# Patient Record
Sex: Female | Born: 1935
Health system: Southern US, Community
[De-identification: ages and names within clinical notes are randomized; demographics above are authoritative.]

## PROBLEM LIST (undated history)

## (undated) DIAGNOSIS — I351 Nonrheumatic aortic (valve) insufficiency: Secondary | ICD-10-CM

## (undated) DIAGNOSIS — J449 Chronic obstructive pulmonary disease, unspecified: Secondary | ICD-10-CM

## (undated) DIAGNOSIS — M1990 Unspecified osteoarthritis, unspecified site: Secondary | ICD-10-CM

## (undated) DIAGNOSIS — E785 Hyperlipidemia, unspecified: Secondary | ICD-10-CM

## (undated) DIAGNOSIS — Z9889 Other specified postprocedural states: Secondary | ICD-10-CM

## (undated) DIAGNOSIS — I10 Essential (primary) hypertension: Secondary | ICD-10-CM

## (undated) DIAGNOSIS — E039 Hypothyroidism, unspecified: Secondary | ICD-10-CM

## (undated) DIAGNOSIS — J479 Bronchiectasis, uncomplicated: Secondary | ICD-10-CM

## (undated) DIAGNOSIS — R002 Palpitations: Secondary | ICD-10-CM

## (undated) HISTORY — DX: Essential (primary) hypertension: I10

## (undated) HISTORY — DX: Other specified postprocedural states: Z98.890

## (undated) HISTORY — PX: CATARACT EXTRACTION, BILATERAL: SHX1313

## (undated) HISTORY — DX: Unspecified osteoarthritis, unspecified site: M19.90

## (undated) HISTORY — DX: Palpitations: R00.2

## (undated) HISTORY — DX: Hyperlipidemia, unspecified: E78.5

---

## 1991-09-28 HISTORY — PX: ABDOMINAL HYSTERECTOMY: SHX81

## 1998-11-18 ENCOUNTER — Ambulatory Visit (HOSPITAL_COMMUNITY): Admission: RE | Admit: 1998-11-18 | Discharge: 1998-11-18 | Payer: Self-pay | Admitting: *Deleted

## 1999-01-05 ENCOUNTER — Other Ambulatory Visit: Admission: RE | Admit: 1999-01-05 | Discharge: 1999-01-05 | Payer: Self-pay | Admitting: Obstetrics and Gynecology

## 1999-06-12 ENCOUNTER — Ambulatory Visit (HOSPITAL_COMMUNITY): Admission: RE | Admit: 1999-06-12 | Discharge: 1999-06-12 | Payer: Self-pay | Admitting: *Deleted

## 1999-11-27 ENCOUNTER — Ambulatory Visit (HOSPITAL_COMMUNITY): Admission: RE | Admit: 1999-11-27 | Discharge: 1999-11-27 | Payer: Self-pay | Admitting: *Deleted

## 2000-03-04 ENCOUNTER — Encounter (INDEPENDENT_AMBULATORY_CARE_PROVIDER_SITE_OTHER): Payer: Self-pay | Admitting: Specialist

## 2000-03-04 ENCOUNTER — Ambulatory Visit (HOSPITAL_COMMUNITY): Admission: RE | Admit: 2000-03-04 | Discharge: 2000-03-04 | Payer: Self-pay | Admitting: Gastroenterology

## 2000-05-27 ENCOUNTER — Other Ambulatory Visit: Admission: RE | Admit: 2000-05-27 | Discharge: 2000-05-27 | Payer: Self-pay | Admitting: Obstetrics and Gynecology

## 2001-12-22 ENCOUNTER — Encounter: Payer: Self-pay | Admitting: Internal Medicine

## 2001-12-22 ENCOUNTER — Ambulatory Visit (HOSPITAL_COMMUNITY): Admission: RE | Admit: 2001-12-22 | Discharge: 2001-12-22 | Payer: Self-pay | Admitting: Internal Medicine

## 2003-08-06 ENCOUNTER — Emergency Department (HOSPITAL_COMMUNITY): Admission: EM | Admit: 2003-08-06 | Discharge: 2003-08-07 | Payer: Self-pay | Admitting: Emergency Medicine

## 2003-08-12 ENCOUNTER — Ambulatory Visit (HOSPITAL_COMMUNITY): Admission: RE | Admit: 2003-08-12 | Discharge: 2003-08-12 | Payer: Self-pay | Admitting: Internal Medicine

## 2003-09-03 ENCOUNTER — Ambulatory Visit (HOSPITAL_COMMUNITY): Admission: RE | Admit: 2003-09-03 | Discharge: 2003-09-03 | Payer: Self-pay | Admitting: Internal Medicine

## 2004-11-17 ENCOUNTER — Ambulatory Visit (HOSPITAL_COMMUNITY): Admission: RE | Admit: 2004-11-17 | Discharge: 2004-11-17 | Payer: Self-pay | Admitting: Internal Medicine

## 2004-11-18 ENCOUNTER — Ambulatory Visit (HOSPITAL_COMMUNITY): Admission: RE | Admit: 2004-11-18 | Discharge: 2004-11-18 | Payer: Self-pay | Admitting: Internal Medicine

## 2005-08-06 ENCOUNTER — Ambulatory Visit (HOSPITAL_COMMUNITY): Admission: RE | Admit: 2005-08-06 | Discharge: 2005-08-06 | Payer: Self-pay | Admitting: Internal Medicine

## 2005-10-07 ENCOUNTER — Other Ambulatory Visit: Admission: RE | Admit: 2005-10-07 | Discharge: 2005-10-07 | Payer: Self-pay | Admitting: Dermatology

## 2006-01-14 ENCOUNTER — Ambulatory Visit: Payer: Self-pay | Admitting: Internal Medicine

## 2006-01-17 ENCOUNTER — Ambulatory Visit: Payer: Self-pay | Admitting: Internal Medicine

## 2006-01-26 ENCOUNTER — Ambulatory Visit (HOSPITAL_COMMUNITY): Admission: RE | Admit: 2006-01-26 | Discharge: 2006-01-26 | Payer: Self-pay | Admitting: Internal Medicine

## 2006-02-14 ENCOUNTER — Ambulatory Visit: Payer: Self-pay | Admitting: Internal Medicine

## 2007-03-27 ENCOUNTER — Ambulatory Visit: Payer: Self-pay | Admitting: Internal Medicine

## 2007-03-29 ENCOUNTER — Ambulatory Visit (HOSPITAL_COMMUNITY): Admission: RE | Admit: 2007-03-29 | Discharge: 2007-03-29 | Payer: Self-pay | Admitting: Internal Medicine

## 2007-08-15 ENCOUNTER — Encounter: Admission: RE | Admit: 2007-08-15 | Discharge: 2007-08-15 | Payer: Self-pay | Admitting: Cardiology

## 2007-09-20 ENCOUNTER — Inpatient Hospital Stay (HOSPITAL_COMMUNITY): Admission: EM | Admit: 2007-09-20 | Discharge: 2007-09-23 | Payer: Self-pay | Admitting: Emergency Medicine

## 2007-09-26 ENCOUNTER — Ambulatory Visit: Payer: Self-pay | Admitting: Gastroenterology

## 2007-10-20 ENCOUNTER — Ambulatory Visit: Payer: Self-pay | Admitting: Gastroenterology

## 2007-10-24 ENCOUNTER — Ambulatory Visit: Payer: Self-pay | Admitting: Gastroenterology

## 2007-10-24 ENCOUNTER — Encounter: Payer: Self-pay | Admitting: Gastroenterology

## 2009-07-22 ENCOUNTER — Telehealth: Payer: Self-pay | Admitting: Gastroenterology

## 2010-04-29 ENCOUNTER — Ambulatory Visit (HOSPITAL_COMMUNITY): Admission: RE | Admit: 2010-04-29 | Discharge: 2010-04-29 | Payer: Self-pay | Admitting: Internal Medicine

## 2010-05-08 ENCOUNTER — Ambulatory Visit: Payer: Self-pay | Admitting: Internal Medicine

## 2010-05-08 DIAGNOSIS — J439 Emphysema, unspecified: Secondary | ICD-10-CM

## 2010-05-08 DIAGNOSIS — R918 Other nonspecific abnormal finding of lung field: Secondary | ICD-10-CM

## 2010-05-08 DIAGNOSIS — I10 Essential (primary) hypertension: Secondary | ICD-10-CM | POA: Insufficient documentation

## 2010-05-12 ENCOUNTER — Ambulatory Visit: Payer: Self-pay | Admitting: Cardiology

## 2010-10-18 ENCOUNTER — Encounter: Payer: Self-pay | Admitting: Internal Medicine

## 2010-10-27 NOTE — Assessment & Plan Note (Signed)
Summary: re establish-Dr. Maricela Bo report f/u/kcw   CC:  Re-establish per Amy Stevenson-CXR report ? CT needed.[name]  History of Present Illness: 03/30/07-  PROBLEM: 1. Lung nodules. 2. Obstructive airways disease. 3. Insomnia.  HISTORY:  Complains ears are stopped up, best unchanged.  No cough now in midsummer.  No sudden events.  No adenopathy or fever.   May 08, 2010- 77 yoF former smoker,  last seen in 2008- paper chart, with hx lung nodule. Sent back now courtesy of Dr Johnnye Sima to reassess. Denies cough and says chest and breathing feel well. CT from 03/29/07, compared back to  2006 had favored multiple lung nodules stable and benign., She walks treadmill 2 miles, twice weekly at gym. Occasional cough after dinner, otw no cough, wheeze, phlegm, chest pain, nodes, fever, bolld or purulent discharge. Would like to gain weight, but not losing.  Preventive Screening-Counseling & Management  Alcohol-Tobacco     Smoking Status: quit     Packs/Day: 1.0     Year Started: 1952     Year Quit: 1972     Pack years: 20  Current Medications (verified): 1)  Multivitamins  Tabs (Multiple Vitamin) .... Take 1 By Mouth Once Daily 2)  Vitamin C 500 Mg Tabs (Ascorbic Acid) .... Take 1 By Mouth Once Daily 3)  Atenolol 100 Mg Tabs (Atenolol) .... Take 1 By Mouth Two Times A Day 4)  Triamterene-Hctz 75-50 Mg Tabs (Triamterene-Hctz) .... Take 1 By Mouth Once Daily 5)  Pravastatin Sodium 20 Mg Tabs (Pravastatin Sodium) .... Take 1 By Mouth On M,w,f 6)  Amlodipine Besylate 10 Mg Tabs (Amlodipine Besylate) .... Take 1 By Mouth Once Daily  Allergies (verified): No Known Drug Allergies  Past History:  Past Medical History: C O P D Hypertension Lower GI bleed- diverticulosis, transfusion 2008 Lung nodules  Family History: Father- died cancer Mother- died cancer  Social History: Married  [place] . Passive- husband smokes outsideSmoking Status:  quit Packs/Day:   1.0 Pack years:  20  Review of Systems      See HPI       The patient complains of irregular heartbeats, nasal congestion/difficulty breathing through nose, itching, anxiety, hand/feet swelling, and joint stiffness or pain.  The patient denies shortness of breath with activity, shortness of breath at rest, productive cough, non-productive cough, coughing up blood, chest pain, acid heartburn, indigestion, loss of appetite, weight change, abdominal pain, difficulty swallowing, sore throat, tooth/dental problems, headaches, sneezing, ear ache, depression, rash, change in color of mucus, and fever.    Vital Signs:  Patient profile:   77 year old female Weight:      100.38 pounds O2 Sat:      97 % on Room air Pulse rate:   76 / minute BP sitting:   136 / 80  (right arm) Cuff size:   regular  Vitals Entered By: Clayborne Dana CMA (May 08, 2010 11:48 AM)  O2 Flow:  Room air CC: Re-establish per Amy Stevenson-CXR report ? CT needed.    Impression & Recommendations:  Problem # 1:  LUNG NODULE (ICD-518.89)  She would like an update on lung nodules with respect to her significant past active and passive smoking hx. We dscussed options and agreed to get a CT without contrast. We can call result. I will put down to see her in a year, subject to review of CT.  Problem # 2:  COPD (ICD-496) Clinically stable. She would like to gain weight but isn't  losing. Few stigmata of active disease. No wheeze or cough to require bronchodilators.  Medications Added to Medication List This Visit: 1)  Multivitamins Tabs (Multiple vitamin) .... Take 1 by mouth once daily 2)  Vitamin C 500 Mg Tabs (Ascorbic acid) .... Take 1 by mouth once daily 3)  Atenolol 100 Mg Tabs (Atenolol) .... Take 1 by mouth two times a day 4)  Triamterene-hctz 75-50 Mg Tabs (Triamterene-hctz) .... Take 1 by mouth once daily 5)  Pravastatin Sodium 20 Mg Tabs (Pravastatin sodium) .... Take 1 by mouth on m,w,f 6)  Amlodipine Besylate  10 Mg Tabs (Amlodipine besylate) .... Take 1 by mouth once daily  Other Orders: Est. Patient Level IV (50757) Radiology Referral (Radiology)  Patient Instructions: 1)  Please schedule a follow-up appointment in 1 year. 2)  A Chest CT WITHOUT Contrast has been recommended. Your imaging study may require preauthorization.

## 2010-11-12 ENCOUNTER — Ambulatory Visit: Payer: Self-pay | Admitting: Internal Medicine

## 2011-01-15 ENCOUNTER — Encounter: Payer: Self-pay | Admitting: Cardiology

## 2011-01-15 DIAGNOSIS — I340 Nonrheumatic mitral (valve) insufficiency: Secondary | ICD-10-CM

## 2011-01-15 DIAGNOSIS — R002 Palpitations: Secondary | ICD-10-CM

## 2011-01-15 DIAGNOSIS — M1990 Unspecified osteoarthritis, unspecified site: Secondary | ICD-10-CM

## 2011-01-15 DIAGNOSIS — I10 Essential (primary) hypertension: Secondary | ICD-10-CM

## 2011-01-15 DIAGNOSIS — E785 Hyperlipidemia, unspecified: Secondary | ICD-10-CM

## 2011-01-15 DIAGNOSIS — I071 Rheumatic tricuspid insufficiency: Secondary | ICD-10-CM

## 2011-01-19 ENCOUNTER — Ambulatory Visit (INDEPENDENT_AMBULATORY_CARE_PROVIDER_SITE_OTHER): Payer: Medicare Other | Admitting: Cardiology

## 2011-01-19 ENCOUNTER — Encounter: Payer: Self-pay | Admitting: Cardiology

## 2011-01-19 DIAGNOSIS — I10 Essential (primary) hypertension: Secondary | ICD-10-CM

## 2011-01-19 DIAGNOSIS — R002 Palpitations: Secondary | ICD-10-CM

## 2011-01-19 NOTE — Assessment & Plan Note (Signed)
I think her current regimen is satisfactory. Dr. Johnnye Sima has been watching her hypertension and has added doxazosin. She does have some mild lower extremity edema as the day progresses have sure that is why she decreased amlodipine. It is possible that doxazosin is leading to her tendency toward orthostasis.

## 2011-01-19 NOTE — Progress Notes (Signed)
Subjective:   Andrea [name] is seen today for followup visit. Her blood pressure readings have been satisfactory and they have been followed by Dr. Johnnye Sima. She's not having much in way of chest pain. She still has some indigestion and is willing to take over-the-counter Prilosec. Overall, she seemingly is doing well without significant complaint. She noted that she had some lightheadedness when she stands but on careful questioning, she denied that was any significant problem. She has gained about 10 pounds of weight since her last visit which is much better. Overall, she doesn't have any significant complaints. I discussed ongoing cardiology followup for palpitations and hypertension with Dr. Stanford Breed in 6-8 months he  Current Outpatient Prescriptions  Medication Sig Dispense Refill  . ALPRAZolam (XANAX) 0.5 MG tablet Take 0.25 mg by mouth at bedtime as needed.        [name] amLODipine (NORVASC) 5 MG tablet Take 5 mg by mouth 2 (two) times daily.        . Ascorbic Acid (VITAMIN C) 500 MG tablet Take 500 mg by mouth daily.        [name] atenolol (TENORMIN) 100 MG tablet Take 100 mg by mouth 2 (two) times daily.       . DHA-EPA-Coenzyme Q10-Vitamin E (COQ-10 & FISH OIL PO) Take 1 capsule by mouth daily.        [name] doxazosin (CARDURA) 2 MG tablet Take 2 mg by mouth at bedtime.        . Multiple Vitamin (MULTIVITAMIN) tablet Take 1 tablet by mouth daily.        . pravastatin (PRAVACHOL) 20 MG tablet 1 tab daily on Mon, Wed, Fri       . triamterene-hydrochlorothiazide (MAXZIDE) 75-50 MG per tablet Take 0.5 tablets by mouth daily.          No Known Allergies  Patient Active Problem List  Diagnoses  . HYPERTENSION  . Chronic airway obstruction, not elsewhere classified  . LUNG NODULE  . Palpitations  . HLD (hyperlipidemia)  . DJD (degenerative joint disease)  . Mild mitral regurgitation by prior echocardiogram  . Mild tricuspid regurgitation  . HTN (hypertension)  . Chest pain    History  Smoking status  .  Former Smoker -- 1.0 packs/day for 20 years  . Types: Cigarettes  . Quit date: 09/27/1974  Smokeless tobacco  . Never Used    History  Alcohol Use No    No family history on file.  Review of Systems:   The patient denies any heat or cold intolerance.  No weight gain or weight loss.  The patient denies headaches or blurry vision.  There is no cough or sputum production.  The patient denies dizziness.  There is no hematuria or hematochezia.  The patient denies any muscle aches or arthritis.  The patient denies any rash.  The patient denies frequent falling or instability.  There is no history of depression or anxiety.  All other systems were reviewed and are negative.   Physical Exam:   Weight is 110. Blood pressure 130/60 sitting, 102/44 standing.The head is normocephalic and atraumatic.  Pupils are equally round and reactive to light.  Sclerae nonicteric.  Conjunctiva is clear.  Oropharynx is unremarkable.  There's adequate oral airway.  Neck is supple there are no masses.  Thyroid is not enlarged.  There is no lymphadenopathy.  Lungs are clear.  Chest is symmetric.  Heart shows a regular rate and rhythm.  S1 and S2 are normal.  There is no  murmur click or gallop.  Abdomen is soft normal bowel sounds.  There is no organomegaly.  Genital and rectal deferred.  Extremities are without edema.  Peripheral pulses are adequate.  Neurologically intact.  Full range of motion.  The patient is not depressed.  Skin is warm and dry.  Assessment / Plan:

## 2011-01-19 NOTE — Assessment & Plan Note (Signed)
Palpitations are reasonably controlled on atenolol 100 mg twice a day. She has palpitations when she lies down at night but not during the day. We'll continue her on her beta blockers for now.

## 2011-02-09 NOTE — Assessment & Plan Note (Signed)
[facility]                             PULMONARY OFFICE NOTE   NAME:Andrea [name], Andrea [name]                       MRN:          [id number]  DATE:03/27/2007                            DOB:          [birth year]    PROBLEM:  1. Lung nodules.  2. Obstructive airways disease.  3. Insomnia.   HISTORY:  Complains ears are stopped up, best unchanged.  No cough now  in midsummer.  No sudden events.  No adenopathy or fever.   MEDICATIONS:  1. Triamterene/hydrochlorothiazide 75/50 one half daily.  2. Atenolol 100 mg x2.  3. Diovan 160.  4. Aspirin 81 mg.  5. Calcium.  6. Multivitamins.  7. Flax seed oil.  8. Alprazolam 0.5 mg x1/2 nightly.   NO MEDICATION ALLERGY.   OBJECTIVE:  Weight 106 pounds.  BP 202/102.  Rechecked 172/78.  Pulse  75.  Room air saturation 97%.  Bruised right arm from walking in to a cabinet.  Thin.  No adenopathy.  Breath sounds are diminished bilaterally without cough, rales, or  wheeze.  HEART:  Sounds are regular.  I do not hear a murmur.  There is no peripheral edema.  Cerumen in both ears.   IMPRESSION:  1. Eustachian dysfunction and cerumen impaction.  2. History of lung nodules.  3. Minimal chronic obstructive pulmonary disease evident only as small      airway flow reduction with normal diffusion capacity when we      checked her a year ago.   PLAN:  1. Cerumen kit.  2. CT scan of chest to follow up on lung nodules, no contrast.  3. Schedule return in 1 year.   Chest CT shows stable lung nodules interpreted by radiologist as  consistent with a benign process with no further followup recommended.  Also, stable marked changes of COPD.  Nothing new.     Clinton D. Annamaria Boots, MD, Shade Flood, FACP  Electronically Signed    CDY/MedQ  DD: 03/30/2007  DT: 03/30/2007  Job #: [id number]   cc:   Darcus Austin, D.O.  Ludwig Lean. Doreatha Lew, M.D.

## 2011-02-09 NOTE — Consult Note (Signed)
NAMEGLESSIE, EUSTICE                ACCOUNT NO.:  [id number]   MEDICAL RECORD NO.:  [id number]          PATIENT TYPE:  EMS   LOCATION:  MAJO                         FACILITY:  [facility]   PHYSICIAN:  Sandy Salaam. Deatra Ina, MD,FACGDATE OF BIRTH:  08-Jan-1929   DATE OF CONSULTATION:  09/20/2007  DATE OF DISCHARGE:                                 CONSULTATION   PROBLEM:  Hematochezia.   HISTORY OF PRESENT ILLNESS:  Ms. Neale is a 77 year old white female  admitted through the emergency room with bright red blood per rectum.  Approximately 2 hours prior to hospital visit she developed painless  rectal bleeding.  She has had multiple (at least 3) episodes of bright  red blood per rectum soaking her clothes and her sheets.  She is passing  blood only and no stool.  She apparently underwent a colonoscopy  approximately 7 years ago although results are not known.  She has had  no recent change in bowel habits.  She is on no gastric irritants  including nonsteroidals.  An NG tube was put into the stomach and  gastric contents were aspirated in the emergency room.  No blood was  seen.   PAST MEDICAL HISTORY:  Pertinent for coronary artery disease.  She has  history of arrhythmias and hypertension.   MEDICATIONS:  Include Diovan, atenolol, Dyazide, baby aspirin and  alprazolam.   ALLERGIES:  None.   SOCIAL HISTORY:  She neither smokes nor drinks.  She is married and  retired.   REVIEW OF SYSTEMS:  Negative.   PHYSICAL EXAMINATION:  VITAL SIGNS:  Pulse 85, blood pressure 198/86.  HEENT:  She is anicteric.  Exam is within normal limits.  There is no  lymphadenopathy.  CHEST:  Clear.  CARDIAC:  There is a 1/6 early systolic murmur at the left sternal  border.  ABDOMEN:  Without masses, tenderness or organomegaly.  Bowel sounds are  hyperactive.  She has bright red blood extruding from her anus.   DATA:  Hemoglobin 12.7, hematocrit 38, white count 8.4, MCV is 92 and  platelet count 204.   Electrolytes are within normal limits.  BUN of 26.   IMPRESSION:  1. Acute lower gastrointestinal bleed.  I suspect this is most likely      a diverticular bleed.  Bleeding from AVMs (arteriovenous      malformations) neoplasm and less likely hemorrhoids are other      possibilities.  Finally, an occult upper gastrointestinal bleeding      source must also be considered.  2. Coronary artery disease.  3. Hypertension.  4. Anemia - secondary to acute gastrointestinal blood loss.   RECOMMENDATIONS:  1. A 500 mL normal saline fluid bolus.  2. Transfuse 2 units packed cells.  3. Stat nuclear medicine bleeding scan; if positive will pursue with      arteriography.  4. IV Protonix 40 mg daily.      Sandy Salaam. Deatra Ina, MD,FACG  Electronically Signed     RDK/MEDQ  D:  09/20/2007  T:  09/20/2007  Job:  [id number]   cc:   Amy R.  Johnnye Sima, D.O.

## 2011-02-09 NOTE — H&P (Signed)
Andrea [name], Andrea [name]                ACCOUNT NO.:  [id number]   MEDICAL RECORD NO.:  [id number]          PATIENT TYPE:  INP   LOCATION:  2309                         FACILITY:  [facility]   PHYSICIAN:  Doree Albee, M.D.DATE OF BIRTH:  April 19, 1929   DATE OF ADMISSION:  09/20/2007  DATE OF DISCHARGE:                              HISTORY & PHYSICAL   HISTORY:  This is a very pleasant, 77 year old lady who, since  approximately 4:30 p.m. on December 24, started to get painless rectal  bleeding, quite profusely.  This was enough bleeding to soak her clothes  and her sheets.  She has been passing blood only without any stool.  She  apparently underwent a colonoscopy approximately seven years ago and she  told me that there were benign polyps found but no other problems.  She  denies any abdominal pain.  No hematemesis.   PAST SURGICAL HISTORY:  Hysterectomy.   PAST MEDICAL HISTORY:  1. Hypertension.  2. Apparently she also has a history of chronic obstructive pulmonary      disease and lung nodules which from the ER chart, do not appear to      be malignant.   MEDICATIONS:  1. Triamterene/hydrochlorothiazide 75/50 one-half tablet daily.  2. Atenolol 100 mg 2 times a day.  3. Diovan 160 mg daily.  4. Aspirin 81 mg daily.  5. Multivitamins and flax seed, over the counter.  6. Alprazolam 0.5 mg one-half tablet nightly.   ALLERGIES:  None.   SOCIAL HISTORY:  She has been married for 52 years.  Does not smoke and  does not drink alcohol excessively.   FAMILY HISTORY:  Noncontributory.   REVIEW OF SYSTEMS:  Apart from the symptoms mentioned above, there are  no other symptoms referable to all systems reviewed.   PHYSICAL EXAMINATION:  GENERAL:  She appears to be hemodynamically  stable.  She does not look clinically anemic.  VITAL SIGNS:  The blood pressure was 150/70, pulse 90 to 95 sinus  rhythm.  CARDIOVASCULAR:  Heart sounds present and normal.  LUNGS:  Fields are clear,  although air entry is reduced.  ABDOMEN:  Soft and non-tender with no hepatosplenomegaly.  NEUROLOGIC EXAMINATION:  She is alert and oriented with no focal  neurologic signs.   LABORATORY DATA:  Hemoglobin 12.7 on admission, white blood cell count  8400, platelets 204,000, creatinine 1.5, sodium 134, potassium 4.5,  chloride 104, glucose 131, BUN 26, prothrombin time 1.0.  She did have a  bleeding scan and the source of the bleeding is not clear.  She  apparently also had a mesenteric angiogram which is apparently and  negative.   IMPRESSION:  1. Lower gastrointestinal bleed, possibly diverticular.  2. Hypertension.   PLAN:  1. Admit.  2. Give blood of two units.  She is on the second unit of blood at the      present time.  3. Watch hemoglobin and also blood pressure and hemodynamics.  4. Surgical consultation if bleeding does not stop.  5. Further recommendations will depend on the patient's hospital      progress.  Doree Albee, M.D.  Electronically Signed     NCG/MEDQ  D:  09/21/2007  T:  09/21/2007  Job:  [id number]   cc:   Darcus Austin, D.O.  Sandy Salaam. Deatra Ina, MD,FACG

## 2011-02-09 NOTE — Discharge Summary (Signed)
Andrea [name], Andrea [name]                ACCOUNT NO.:  [id number]   MEDICAL RECORD NO.:  [id number]          PATIENT TYPE:  INP   LOCATION:  3893                         FACILITY:  [facility]   PHYSICIAN:  Aquilla Hacker, M.D. DATE OF BIRTH:  March 19, 1929   DATE OF ADMISSION:  09/20/2007  DATE OF DISCHARGE:  09/23/2007                               DISCHARGE SUMMARY   PRIMARY CARE PHYSICIAN:  Darcus Austin, D.O.   FINAL DIAGNOSES:  1. Gastrointestinal bleed.  2. Hypoalbuminemia.  3. Thrombocytopenia.  4. Hypokalemia   PROCEDURES:  1. Colonoscopy.  2. Transfusion of 2 units of packed red blood cells   HISTORY OF PRESENT ILLNESS:  Ms. Morre is a 77 year old female who  indicated that at approximately 4:30 p.m. on September 20, 2007 she  developed painless rectal bleeding.  She indicated that the amount of  blood loss was profuse and that she was passing blood without the  presence of stools.   For past medical history, please see that dictated by Dr. Hurshel Party.   HOSPITAL COURSE:  Problem 1.  Acute GI bleed.  The patient arrived with  a hemoglobin of 12.7.  Because the amount of rectal bleeding appeared to  be brisk, the decision was made to transfuse her 2 units of packed red  blood cells.  Dr. Erskine Emery of [facility] was consulted, and on  September 22, 2007 he performed a colonoscopy.  Diverticulosis was noted  during the colonoscopy as well as an ulcerated area measuring  approximately 4-5 mm with a large visible vessel.  An ulcer was noted to  be at the tip of the pedunculated mucosa.  Two endoscopic clips were  applied toward the tip, and 4 mL of epinephrine was injected  submucosally at the base.  This was found in the sigmoid colon.  The  patient indicated that her rectal bleeding had decreased during the  course of her hospitalization.  She never complained of any abdominal  pain.  Her hemoglobin remained relatively stable throughout the latter  portion of her  hospitalization.   Problem 2.  Hypoalbuminemia.  The patient's protein level was noted to  be 5.8 with albumin of 3.3 on September 21, 2007.  Whether or not this  was related to protein calorie malnutrition was questionable.  The  patient had will be encouraged to eat regularly prescribed meals  following her discharge.   Problem 3.  Hypokalemia.  The patient received potassium  supplementation.   CONDITION ON DISCHARGE:  Condition on discharge appears to be stable.  The patient indicates that she feels better.  She denies having any  pain, and she also indicates that the rectal bleeding has subsided.  Her  sodium level was 138, potassium 3.6, chloride 101, CO2 31, glucose 119,  BUN 8, creatinine 1.07, calcium 8.7.  White blood cell count 7.3,  hemoglobin 12.1, hematocrit 36.1, platelet count 133.   The decision has been made to discharge the patient home.   DISCHARGE MEDICATIONS:  1. Atenolol 50 mg 1 tablet p.o. b.i.d.  2. Diovan 160 mg p.o. daily.  3. Calcium supplementation.  4. The patient will be told to stop taking aspirin.   FOLLOW UP:  She will be told to follow up with Dr. Erskine Emery of  [facility] on October 20, 2007 at 9:30 a.m.      Aquilla Hacker, M.D.  Electronically Signed     OR/MEDQ  D:  09/23/2007  T:  09/24/2007  Job:  [id number]   cc:   Darcus Austin, D.O.  Sandy Salaam. Deatra Ina, MD,FACG

## 2011-02-09 NOTE — Assessment & Plan Note (Signed)
[facility]                         GASTROENTEROLOGY OFFICE NOTE   NAME:GRIFFINChaitra, Andrea [name]                       MRN:          [id number]  DATE:10/20/2007                            DOB:          02/22/1929    PROBLEM:  Andrea [name] has returned following her hospitalization for  acute lower GI bleeding.  An ulcerated area on what appeared to be a  stalk was seen in the sigmoid colon that was treated with endoscopic  clips and epinephrine injection.  She has diffuse diverticulosis.  The  area of ulceration was felt to be the source for her bleeding.  Since  that time she has had no further rectal bleeding.  Altogether she is  feeling well.   MEDICATIONS:  1. Diazide.  2. Atenolol.  3. Diovan.  4. Baby aspirin.  5. Alprazolam.   PHYSICAL EXAMINATION:  Pulse 68, blood pressure 140/72, weight 104.   IMPRESSION:  Acute lower gastrointestinal bleeding secondary to an  ulcerated area of the sigmoid colon that has the appearance of a  pedunculated polyp but without the polyp head.   RECOMMENDATIONS:  1. Sigmoidoscopy to further clarify this area.  2. Diverticulosis.     Sandy Salaam. Deatra Ina, MD,FACG  Electronically Signed    RDK/MedQ  DD: 10/20/2007  DT: 10/20/2007  Job #: [id number]   cc:   Darcus Austin, D.O.

## 2011-02-11 ENCOUNTER — Telehealth: Payer: Self-pay | Admitting: Cardiology

## 2011-02-11 NOTE — Telephone Encounter (Signed)
Pt instructed to call Dr. Armando Reichert office for refills.  Pt has not had any recent labs with our office.

## 2011-02-11 NOTE — Telephone Encounter (Signed)
Was calling to get a prescription of Pravastin 46m refilled. [facility] [place] I have pulled the chart.

## 2011-02-12 NOTE — Op Note (Signed)
[facility]  Patient:    Andrea [name], Andrea [name]                       MRN: [id number] Proc. Date: 03/04/00 Adm. Date:  [id number] Disc. Date: [id number] Attending:  Rafael Bihari CC:         [place] Susa Griffins, M.D.                           Operative Report  PROCEDURE PERFORMED:  Colonoscopy.  ENDOSCOPIST:  Elyse Jarvis. Amedeo Plenty, M.D.  INDICATIONS FOR PROCEDURE:  Rectal polyp seen on flexible sigmoidoscopy.  DESCRIPTION OF PROCEDURE:  The patient was placed in the left lateral decubitus position and placed on the pulse monitor and continuous low flow oxygen delivered by nasal cannula.  She was sedated with 50 mg IV Demerol and 7 mg IV Versed.  The Olympus video colonoscope was inserted into the rectum and advanced to the cecum, confirmed by transillumination of McBurneys point and visualization of the ileocecal valve and appendiceal orifice.  The prep was fairly good.  The cecum, ascending, transverse and descending colon all appeared normal with no masses, polyps, diverticula or other mucosal abnormalities.  Within the sigmoid colon were seen several scattered diverticula.  No other abnormalites.  Within the rectum were seen two polyps less than 1 cm in diameter, one at 3 cm and one at 12 cm.  Both were fulgurated by hot biopsy.  The colonoscope was then withdrawn and the patient returned to the recovery room in stable condition.  She tolerated the procedure well and there were no immediate complications.  IMPRESSION: 1. Two rectal polyps. 2. Sigmoid diverticulosis.  PLAN:  Await histology for determination of possible need for future surveillance colonoscopies. DD:  03/04/00 TD:  03/08/00 Job: [id number]

## 2011-03-01 ENCOUNTER — Other Ambulatory Visit: Payer: Self-pay | Admitting: *Deleted

## 2011-03-01 MED ORDER — ATENOLOL 100 MG PO TABS: 100.0000 mg | ORAL_TABLET | Freq: Two times a day (BID) | ORAL | Status: DC

## 2011-06-17 ENCOUNTER — Telehealth: Payer: Self-pay | Admitting: Cardiology

## 2011-06-17 NOTE — Telephone Encounter (Signed)
Lm that she will be seeing Dr. Kirk Ruths. From computer looks like notice was sent out for her to make an app for Oct. Advised to call [phone] to get an app.

## 2011-06-17 NOTE — Telephone Encounter (Signed)
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

## 2011-07-02 LAB — CROSSMATCH

## 2011-07-02 LAB — BASIC METABOLIC PANEL
BUN: 8
CO2: 27
Calcium: 8.8
Chloride: 101
Chloride: 98
Creatinine, Ser: 0.92
Creatinine, Ser: 1.07
GFR calc Af Amer: >60
Glucose, Bld: 119 — ABNORMAL HIGH
Potassium: 3.6
Sodium: 132 — ABNORMAL LOW

## 2011-07-02 LAB — COMPREHENSIVE METABOLIC PANEL
ALT: 15
AST: 25
Alkaline Phosphatase: 48
CO2: 26
Chloride: 104
Creatinine, Ser: 1.09
GFR calc Af Amer: 59 — ABNORMAL LOW
GFR calc non Af Amer: 49 — ABNORMAL LOW
Potassium: 4.5
Sodium: 137
Total Bilirubin: 1.3 — ABNORMAL HIGH

## 2011-07-02 LAB — DIFFERENTIAL
Basophils Absolute: 0
Eosinophils Relative: 6 — ABNORMAL HIGH
Lymphocytes Relative: 21
Lymphs Abs: 1.8
Monocytes Absolute: 0.5
Neutro Abs: 5.6

## 2011-07-02 LAB — CARDIAC PANEL(CRET KIN+CKTOT+MB+TROPI)
CK, MB: 1.6
Relative Index: INVALID
Relative Index: INVALID
Total CK: 50
Total CK: 53
Troponin I: 0.03

## 2011-07-02 LAB — CBC
HCT: 36.1
HCT: 38.1
HCT: 40.6
Hemoglobin: 12.7
Hemoglobin: 13.7
MCHC: 31.9
MCHC: 33.6
MCV: 90.5
MCV: 91
MCV: 91.2
Platelets: 133 — ABNORMAL LOW
Platelets: 154
Platelets: 165
RBC: 4.14
RBC: 4.45
RDW: 12.5
RDW: 12.9
RDW: 13.6
RDW: 13.6
WBC: 11.2 — ABNORMAL HIGH
WBC: 7.3
WBC: 8.4

## 2011-07-02 LAB — HEMOGLOBIN AND HEMATOCRIT, BLOOD
HCT: 36.3
HCT: 41.6

## 2011-07-02 LAB — I-STAT 8, (EC8 V) (CONVERTED LAB)
BUN: 26 — ABNORMAL HIGH
Bicarbonate: 27.9 — ABNORMAL HIGH
HCT: 41
Operator id: 294521
pCO2, Ven: 44.5 — ABNORMAL LOW

## 2011-07-02 LAB — POCT CARDIAC MARKERS
Myoglobin, poc: 83.9
Operator id: 294521
Troponin i, poc: <0.05

## 2011-07-02 LAB — PROTIME-INR: INR: 1

## 2011-07-02 LAB — POCT I-STAT CREATININE: Creatinine, Ser: 1.5 — ABNORMAL HIGH

## 2011-08-09 ENCOUNTER — Encounter: Payer: Self-pay | Admitting: *Deleted

## 2011-08-10 ENCOUNTER — Other Ambulatory Visit: Payer: Self-pay

## 2011-08-11 ENCOUNTER — Ambulatory Visit (INDEPENDENT_AMBULATORY_CARE_PROVIDER_SITE_OTHER): Payer: Medicare Other | Admitting: Cardiology

## 2011-08-11 ENCOUNTER — Encounter: Payer: Self-pay | Admitting: Cardiology

## 2011-08-11 DIAGNOSIS — E785 Hyperlipidemia, unspecified: Secondary | ICD-10-CM

## 2011-08-11 DIAGNOSIS — R002 Palpitations: Secondary | ICD-10-CM

## 2011-08-11 DIAGNOSIS — I10 Essential (primary) hypertension: Secondary | ICD-10-CM

## 2011-08-11 MED ORDER — PRAVASTATIN SODIUM 20 MG PO TABS: ORAL_TABLET | ORAL | Status: DC

## 2011-08-11 NOTE — Progress Notes (Signed)
OUZ:[id number] female previously followed by Dr. Doreatha Lew for palpitations and hypertension for fu. Nuclear study in October 2009 revealed an ejection fraction of 73% and normal perfusion. Echocardiogram in 2008 showed normal LV function and mild left ventricular hypertrophy. There is mild mitral and tricuspid regurgitation. She was last seen in April of 2012. Since then, she denies dyspnea or chest pain. She continues to have occasional palpitations which is unchanged. No syncope.   Current Outpatient Prescriptions  Medication Sig Dispense Refill  . ALPRAZolam (XANAX) 0.5 MG tablet Take 0.25 mg by mouth at bedtime as needed.        [name] amLODipine (NORVASC) 5 MG tablet Take 5 mg by mouth 2 (two) times daily.        . Ascorbic Acid (VITAMIN C) 500 MG tablet Take 500 mg by mouth daily.        [name] atenolol (TENORMIN) 100 MG tablet Take 1 tablet (100 mg total) by mouth 2 (two) times daily.  60 tablet  11  . Calcium Carbonate-Vitamin D (CALCIUM-VITAMIN D) 500-200 MG-UNIT per tablet Take 1 tablet by mouth daily.        [name] doxazosin (CARDURA) 2 MG tablet Take 2 mg by mouth at bedtime.        [name] losartan (COZAAR) 50 MG tablet Take 50 mg by mouth daily.        . Multiple Vitamin (MULTIVITAMIN) tablet Take 1 tablet by mouth daily.        . pravastatin (PRAVACHOL) 20 MG tablet 1 tab daily on Mon, Wed, Fri       . triamterene-hydrochlorothiazide (MAXZIDE) 75-50 MG per tablet Take 0.5 tablets by mouth daily.           Past Medical History  Diagnosis Date  . Palpitations   . HLD (hyperlipidemia)   . DJD (degenerative joint disease)   . Mild mitral regurgitation by prior echocardiogram   . Mild tricuspid regurgitation   . HTN (hypertension)     Past Surgical History  Procedure Date  . Abdominal hysterectomy 1993    History   Social History  . Marital Status: Married    Spouse Name: N/A    Number of Children: 62  . Years of Education: N/A   Occupational History  . retired    Social History Main  Topics  . Smoking status: Former Smoker -- 1.0 packs/day for 20 years    Types: Cigarettes    Quit date: 09/27/1974  . Smokeless tobacco: Never Used  . Alcohol Use: No  . Drug Use: No  . Sexually Active: Not on file   Other Topics Concern  . Not on file   Social History Narrative  . No narrative on file    ROS: no fevers or chills, productive cough, hemoptysis, dysphasia, odynophagia, melena, hematochezia, dysuria, hematuria, rash, seizure activity, orthopnea, PND, pedal edema, claudication. Remaining systems are negative.  Physical Exam: Well-developed well-nourished in no acute distress.  Skin is warm and dry.  HEENT is normal.  Neck is supple. No thyromegaly.  Chest is clear to auscultation with normal expansion.  Cardiovascular exam is regular rate and rhythm.  Abdominal exam nontender or distended. No masses palpated. Extremities show no edema. neuro grossly intact

## 2011-08-11 NOTE — Assessment & Plan Note (Signed)
Continue statin. Lipids and liver monitored by primary care. 

## 2011-08-11 NOTE — Assessment & Plan Note (Signed)
Patient continues to have some palpitations. However these are unchanged. Continue present dose of beta blocker. LV function is normal.

## 2011-08-11 NOTE — Assessment & Plan Note (Signed)
Blood pressure controlled. Continue present medications. Potassium and renal function monitored by primary care.

## 2011-08-11 NOTE — Patient Instructions (Signed)
Your physician wants you to follow-up in: 1 year with Dr. Stanford Breed. You will receive a reminder letter in the mail two months in advance. If you don't receive a letter, please call our office to schedule the follow-up appointment.  Your physician recommends that you continue on your current medications as directed. Please refer to the Current Medication list given to you today.

## 2011-08-17 ENCOUNTER — Ambulatory Visit: Payer: Medicare Other | Admitting: Cardiology

## 2011-09-08 DIAGNOSIS — H189 Unspecified disorder of cornea: Secondary | ICD-10-CM | POA: Insufficient documentation

## 2011-09-08 DIAGNOSIS — H04123 Dry eye syndrome of bilateral lacrimal glands: Secondary | ICD-10-CM | POA: Insufficient documentation

## 2012-02-22 ENCOUNTER — Other Ambulatory Visit: Payer: Self-pay | Admitting: Cardiology

## 2012-05-03 ENCOUNTER — Other Ambulatory Visit: Payer: Self-pay | Admitting: Dermatology

## 2012-06-28 ENCOUNTER — Other Ambulatory Visit: Payer: Self-pay | Admitting: Internal Medicine

## 2012-06-28 ENCOUNTER — Other Ambulatory Visit (HOSPITAL_COMMUNITY): Payer: Self-pay | Admitting: Internal Medicine

## 2012-06-28 DIAGNOSIS — Z1231 Encounter for screening mammogram for malignant neoplasm of breast: Secondary | ICD-10-CM

## 2012-06-28 DIAGNOSIS — Z780 Asymptomatic menopausal state: Secondary | ICD-10-CM

## 2012-06-28 DIAGNOSIS — R911 Solitary pulmonary nodule: Secondary | ICD-10-CM

## 2012-07-03 ENCOUNTER — Ambulatory Visit
Admission: RE | Admit: 2012-07-03 | Discharge: 2012-07-03 | Disposition: A | Discharge status: Home / Self Care | Payer: Medicare Other | Source: Ambulatory Visit | Attending: Internal Medicine | Admitting: Internal Medicine

## 2012-07-03 DIAGNOSIS — R911 Solitary pulmonary nodule: Secondary | ICD-10-CM

## 2012-07-13 ENCOUNTER — Ambulatory Visit (HOSPITAL_COMMUNITY): Payer: Medicare Other

## 2012-07-13 ENCOUNTER — Ambulatory Visit (HOSPITAL_COMMUNITY): Payer: Medicare Other | Attending: Internal Medicine

## 2012-08-11 ENCOUNTER — Other Ambulatory Visit: Payer: Self-pay | Admitting: Dermatology

## 2012-11-06 ENCOUNTER — Encounter: Payer: Self-pay | Admitting: Cardiology

## 2012-11-06 ENCOUNTER — Other Ambulatory Visit: Payer: Self-pay | Admitting: *Deleted

## 2012-11-06 ENCOUNTER — Encounter (INDEPENDENT_AMBULATORY_CARE_PROVIDER_SITE_OTHER): Payer: Medicare Other

## 2012-11-06 ENCOUNTER — Telehealth: Payer: Self-pay | Admitting: *Deleted

## 2012-11-06 ENCOUNTER — Ambulatory Visit (INDEPENDENT_AMBULATORY_CARE_PROVIDER_SITE_OTHER): Payer: Medicare Other | Admitting: Cardiology

## 2012-11-06 VITALS — BP 160/70 | HR 76 | Ht 64.0 in | Wt 104.1 lb

## 2012-11-06 DIAGNOSIS — R002 Palpitations: Secondary | ICD-10-CM

## 2012-11-06 DIAGNOSIS — E785 Hyperlipidemia, unspecified: Secondary | ICD-10-CM

## 2012-11-06 DIAGNOSIS — I10 Essential (primary) hypertension: Secondary | ICD-10-CM

## 2012-11-06 NOTE — Progress Notes (Signed)
   HPI: Pleasant female for fu of palpitations and hypertension. Nuclear study in October 2009 revealed an ejection fraction of 73% and normal perfusion. Echocardiogram in 2008 showed normal LV function and mild left ventricular hypertrophy. There is mild mitral and tricuspid regurgitation. She was last seen in Nov of 2012. Since then, she has mild dyspnea on exertion but no orthopnea, PND or pedal edema. No chest pain.  She has noticed increased frequency of palpitations described as a skip.   Current Outpatient Prescriptions  Medication Sig Dispense Refill  . ALPRAZolam (XANAX) 0.5 MG tablet Take 0.25 mg by mouth at bedtime as needed.        . Ascorbic Acid (VITAMIN C) 500 MG tablet Take 500 mg by mouth daily.        [name] atenolol (TENORMIN) 50 MG tablet Take 50 mg by mouth 2 (two) times daily.      [name] diltiazem (DILACOR XR) 180 MG 24 hr capsule Take 180 mg by mouth 2 (two) times daily.      [name] levothyroxine (SYNTHROID, LEVOTHROID) 50 MCG tablet Take 50 mcg by mouth daily. 1/2 tablet once daily, none on sunday      . losartan (COZAAR) 100 MG tablet Take 100 mg by mouth daily.      [name] triamterene-hydrochlorothiazide (MAXZIDE) 75-50 MG per tablet Take 0.5 tablets by mouth daily.        . pravastatin (PRAVACHOL) 20 MG tablet 1 tab daily on Mon, Wed, Fri  30 tablet  6   No current facility-administered medications for this visit.     Past Medical History  Diagnosis Date  . Palpitations   . HLD (hyperlipidemia)   . DJD (degenerative joint disease)   . Mild mitral regurgitation by prior echocardiogram   . Mild tricuspid regurgitation   . HTN (hypertension)     Past Surgical History  Procedure Laterality Date  . Abdominal hysterectomy  1993    History   Social History  . Marital Status: Married    Spouse Name: N/A    Number of Children: 41  . Years of Education: N/A   Occupational History  . retired    Social History Main Topics  . Smoking status: Former Smoker -- 1.00 packs/day for  20 years    Types: Cigarettes    Quit date: 09/27/1974  . Smokeless tobacco: Never Used  . Alcohol Use: No  . Drug Use: No  . Sexually Active: Not on file   Other Topics Concern  . Not on file   Social History Narrative  . No narrative on file    ROS: no fevers or chills, productive cough, hemoptysis, dysphasia, odynophagia, melena, hematochezia, dysuria, hematuria, rash, seizure activity, orthopnea, PND, pedal edema, claudication. Remaining systems are negative.  Physical Exam: Well-developed frail in no acute distress.  Skin is warm and dry.  HEENT is normal.  Neck is supple.  Chest is clear to auscultation with normal expansion.  Cardiovascular exam is regular rate and rhythm.  Abdominal exam nontender or distended. No masses palpated. Extremities show no edema. neuro grossly intact

## 2012-11-06 NOTE — Patient Instructions (Addendum)
Your physician wants you to follow-up in: [place] will receive a reminder letter in the mail two months in advance. If you don't receive a letter, please call our office to schedule the follow-up appointment.   Your physician has recommended that you wear an event monitor. Event monitors are medical devices that record the heart's electrical activity. Doctors most often [place] these monitors to diagnose arrhythmias. Arrhythmias are problems with the speed or rhythm of the heartbeat. The monitor is a small, portable device. You can wear one while you do your normal daily activities. This is usually used to diagnose what is causing palpitations/syncope (passing out).

## 2012-11-06 NOTE — Assessment & Plan Note (Signed)
She is having increased frequency of palpitations. Schedule CardioNet. Continue atenolol.

## 2012-11-06 NOTE — Telephone Encounter (Signed)
Event monitor placed on Pt 11/06/12 TK

## 2012-11-06 NOTE — Assessment & Plan Note (Signed)
Blood pressure is mildly elevated but he states typically controlled at home. Continue present medications. Potassium and renal function monitored by primary care.

## 2012-11-06 NOTE — Assessment & Plan Note (Signed)
Continue statin. Lipids and liver monitored by primary care.

## 2012-11-20 ENCOUNTER — Telehealth: Payer: Self-pay | Admitting: *Deleted

## 2012-11-20 NOTE — Telephone Encounter (Signed)
PT RTN CALL HAS QUESTIONS

## 2012-11-20 NOTE — Telephone Encounter (Signed)
SPOKE WITH   PT   RE MONITOR  CRITICAL STRIPS  FROM  THE 11-19-12 REVIEWED WITH DR NAHSER PER DR NAHSER  STOP DILTIAZEM 180  MG  AND RESTART AMLODIPINE 5 MG AND  PT NEEDS F/U IN  1 MONTH WITH DR CRENSHAW   APPT MADE FOR  01-02-13 AR 10:30 AM WILL FORWARD TO DR Stanford Breed FOR REVIEW .Adonis Housekeeper

## 2012-11-20 NOTE — Telephone Encounter (Signed)
Readings will be on his cart

## 2012-11-20 NOTE — Telephone Encounter (Signed)
Pull strips for me to review Andrea [name]

## 2012-11-21 ENCOUNTER — Telehealth: Payer: Self-pay | Admitting: Cardiology

## 2012-11-21 NOTE — Telephone Encounter (Signed)
Pt needs amilodipine 66m called into [facility] in [place]

## 2012-11-22 MED ORDER — AMLODIPINE BESYLATE 5 MG PO TABS: 5.0000 mg | ORAL_TABLET | Freq: Every day | ORAL | Status: DC

## 2012-11-23 ENCOUNTER — Telehealth: Payer: Self-pay | Admitting: Cardiology

## 2012-11-23 MED ORDER — AMLODIPINE BESYLATE 5 MG PO TABS: 5.0000 mg | ORAL_TABLET | Freq: Two times a day (BID) | ORAL | Status: DC

## 2012-11-23 NOTE — Telephone Encounter (Signed)
Spoke with pt, her bp at this time is 187/94 with pulse 69. She reports she feels fine. Will discuss with dr Stanford Breed

## 2012-11-23 NOTE — Telephone Encounter (Signed)
Discussed with dr Stanford Breed, pt to increase amlodipine to 5 mg twice daily. She reports she just checked her bp and it was 153/76. New script sent to pharm. She will cont to track her blood pressure and let me know of any problems.

## 2012-11-23 NOTE — Telephone Encounter (Signed)
Spoke with pt, yesterday and this am her bp is elevated, also her heart rate is in the 80's and occ it will feel like it is racing with occ skipping. She has not taken any of her medicine this am. She will take her meds and I will call her back in about two hours to see how she is feeling and how her bp is running. Pt agreed with this plan.

## 2012-11-23 NOTE — Telephone Encounter (Signed)
New Problem:    Patient called in wanting to be seen today because last night her BP rose high, is still high and her HR is rapid.  Her BP is 195/104 P 81. Please call back.

## 2012-12-12 ENCOUNTER — Telehealth: Payer: Self-pay | Admitting: Cardiology

## 2012-12-12 NOTE — Telephone Encounter (Signed)
New Problem:    Patient called in because no one has called her to let her know the results of her monitor, her heart is still beating irregularly, and she would like to be scheduled to be seen sooner due to a scheduling conflict.  Please call back.  Patient was aware that you were out of the office when this message was taken.

## 2012-12-13 NOTE — Telephone Encounter (Signed)
Spoke with pt, monitor results discussed. The pt rescheduled her follow up due to a conflict. She will call prior to appt with questions or problems

## 2012-12-26 ENCOUNTER — Encounter: Payer: Self-pay | Admitting: Cardiology

## 2013-01-02 ENCOUNTER — Ambulatory Visit: Payer: Medicare Other | Admitting: Cardiology

## 2013-01-05 ENCOUNTER — Ambulatory Visit (INDEPENDENT_AMBULATORY_CARE_PROVIDER_SITE_OTHER): Payer: Medicare Other | Admitting: Cardiology

## 2013-01-05 ENCOUNTER — Encounter: Payer: Self-pay | Admitting: Cardiology

## 2013-01-05 VITALS — BP 151/65 | HR 67 | Wt 98.0 lb

## 2013-01-05 DIAGNOSIS — E785 Hyperlipidemia, unspecified: Secondary | ICD-10-CM

## 2013-01-05 DIAGNOSIS — I10 Essential (primary) hypertension: Secondary | ICD-10-CM

## 2013-01-05 DIAGNOSIS — R002 Palpitations: Secondary | ICD-10-CM

## 2013-01-05 NOTE — Assessment & Plan Note (Addendum)
Continue atenolol. Monitor showed PACs and PVCs. She also had pauses and her Cardizem was discontinued. Plan repeat echocardiogram. We will obtain most recent electrolytes and TSH from primary care.

## 2013-01-05 NOTE — Patient Instructions (Addendum)
Your physician wants you to follow-up in: [place] will receive a reminder letter in the mail two months in advance. If you don't receive a letter, please call our office to schedule the follow-up appointment.   Your physician has requested that you have an echocardiogram. Echocardiography is a painless test that uses sound waves to create images of your heart. It provides your doctor with information about the size and shape of your heart and how well your heart's chambers and valves are working. This procedure takes approximately one hour. There are no restrictions for this procedure.

## 2013-01-05 NOTE — Assessment & Plan Note (Signed)
Blood pressure mildly elevated. However she follows this at home and it is typically controlled.

## 2013-01-05 NOTE — Progress Notes (Signed)
   HPI: Pleasant female for fu of palpitations and hypertension. Nuclear study in October 2009 revealed an ejection fraction of 73% and normal perfusion. Echocardiogram in 2008 showed normal LV function and mild left ventricular hypertrophy. There is mild mitral and tricuspid regurgitation. CardioNet in February of 2014 showed sinus rhythm with PACs, PVCs, occasional junctional escape beats and pauses greater than 3 seconds. Cardizem was discontinued and Norvasc added. She was last seen in Feb 2014. Since then, she denies dyspnea on exertion, chest pain or syncope. Mild pedal edema. Her palpitations have improved but she occasionally feels a brief skip.   Current Outpatient Prescriptions  Medication Sig Dispense Refill  . ALPRAZolam (XANAX) 0.5 MG tablet Take 0.25 mg by mouth at bedtime as needed.        [name] amLODipine (NORVASC) 5 MG tablet Take 5 mg by mouth daily.      . Ascorbic Acid (VITAMIN C) 500 MG tablet Take 500 mg by mouth daily.        [name] atenolol (TENORMIN) 100 MG tablet Take 50 mg by mouth 2 (two) times daily.      [name] atorvastatin (LIPITOR) 80 MG tablet Take 40 mg by mouth daily.      [name] levothyroxine (SYNTHROID, LEVOTHROID) 50 MCG tablet Take 50 mcg by mouth daily. 1/2 tablet once daily, none on sunday      . losartan (COZAAR) 100 MG tablet Take 100 mg by mouth daily.      . pravastatin (PRAVACHOL) 20 MG tablet Take 10 mg by mouth daily.      [name] triamterene-hydrochlorothiazide (MAXZIDE) 75-50 MG per tablet Take 0.5 tablets by mouth every other day.        No current facility-administered medications for this visit.     Past Medical History  Diagnosis Date  . Palpitations   . HLD (hyperlipidemia)   . DJD (degenerative joint disease)   . Mild mitral regurgitation by prior echocardiogram   . Mild tricuspid regurgitation   . HTN (hypertension)     Past Surgical History  Procedure Laterality Date  . Abdominal hysterectomy  1993    History   Social History  . Marital Status:  Married    Spouse Name: N/A    Number of Children: 42  . Years of Education: N/A   Occupational History  . retired    Social History Main Topics  . Smoking status: Former Smoker -- 1.00 packs/day for 20 years    Types: Cigarettes    Quit date: 09/27/1974  . Smokeless tobacco: Never Used  . Alcohol Use: No  . Drug Use: No  . Sexually Active: Not on file   Other Topics Concern  . Not on file   Social History Narrative  . No narrative on file    ROS: no fevers or chills, productive cough, hemoptysis, dysphasia, odynophagia, melena, hematochezia, dysuria, hematuria, rash, seizure activity, orthopnea, PND, pedal edema, claudication. Remaining systems are negative.  Physical Exam: Well-developed well-nourished in no acute distress.  Skin is warm and dry.  HEENT is normal.  Neck is supple.  Chest is clear to auscultation with normal expansion.  Cardiovascular exam is regular rate and rhythm.  Abdominal exam nontender or distended. No masses palpated. Extremities show trace edema. neuro grossly intact  ECG sinus rhythm at a rate of 67. Left ventricular hypertrophy.

## 2013-01-05 NOTE — Assessment & Plan Note (Signed)
Management per primary care. 

## 2013-02-13 ENCOUNTER — Ambulatory Visit (HOSPITAL_COMMUNITY): Payer: Medicare Other | Attending: Cardiology

## 2013-02-13 DIAGNOSIS — I080 Rheumatic disorders of both mitral and aortic valves: Secondary | ICD-10-CM | POA: Insufficient documentation

## 2013-02-13 DIAGNOSIS — E785 Hyperlipidemia, unspecified: Secondary | ICD-10-CM | POA: Insufficient documentation

## 2013-02-13 DIAGNOSIS — I10 Essential (primary) hypertension: Secondary | ICD-10-CM | POA: Insufficient documentation

## 2013-02-13 DIAGNOSIS — I379 Nonrheumatic pulmonary valve disorder, unspecified: Secondary | ICD-10-CM | POA: Insufficient documentation

## 2013-02-13 DIAGNOSIS — R002 Palpitations: Secondary | ICD-10-CM | POA: Insufficient documentation

## 2013-02-13 DIAGNOSIS — I079 Rheumatic tricuspid valve disease, unspecified: Secondary | ICD-10-CM | POA: Insufficient documentation

## 2013-02-13 DIAGNOSIS — Z87891 Personal history of nicotine dependence: Secondary | ICD-10-CM | POA: Insufficient documentation

## 2013-02-13 DIAGNOSIS — I359 Nonrheumatic aortic valve disorder, unspecified: Secondary | ICD-10-CM

## 2013-02-13 DIAGNOSIS — R609 Edema, unspecified: Secondary | ICD-10-CM | POA: Insufficient documentation

## 2013-02-13 NOTE — Progress Notes (Signed)
Echocardiogram performed.  

## 2013-02-15 ENCOUNTER — Telehealth: Payer: Self-pay | Admitting: Cardiology

## 2013-02-15 NOTE — Telephone Encounter (Signed)
Spoke with pt, questions regarding echo results answered.

## 2013-02-15 NOTE — Telephone Encounter (Signed)
New Prob       Pt has some questions regarding her ECHO results. Would like to speak with nurse.

## 2013-02-24 ENCOUNTER — Other Ambulatory Visit: Payer: Self-pay | Admitting: Cardiology

## 2013-02-27 MED ORDER — ATENOLOL 100 MG PO TABS: 50.0000 mg | ORAL_TABLET | Freq: Two times a day (BID) | ORAL | Status: DC

## 2013-02-27 NOTE — Telephone Encounter (Signed)
Verified with pt she splits Atenolol in half and takes it bid Pharmacy was asking for 1 tab bid

## 2013-03-05 ENCOUNTER — Telehealth: Payer: Self-pay | Admitting: Cardiology

## 2013-03-05 NOTE — Telephone Encounter (Signed)
Spoke with patient who wanted clarification regarding Amlodipine dosage.  Patient wanted to make certain she is to take 5 mg daily.  I verified for patient that this is the correct dosage.  Patient states she occasionally feels a "thump thump" of her heart especially at night.  Patient states she does not feel bad - this just scares her.  I reviewed patient's symptoms at last visit and patient states she does not feel the palpitations any longer.  I advised patient that Dr. Stanford Breed is off this week but that if this continues and she feels that she needs to be seen that she can call back.

## 2013-03-05 NOTE — Telephone Encounter (Signed)
New problem  Pt has a question about her prescription. She said she does not think she is taking the medication right.

## 2013-05-02 ENCOUNTER — Other Ambulatory Visit: Payer: Self-pay

## 2013-05-04 ENCOUNTER — Other Ambulatory Visit: Payer: Self-pay | Admitting: Dermatology

## 2013-05-23 ENCOUNTER — Telehealth: Payer: Self-pay | Admitting: Cardiology

## 2013-05-23 NOTE — Telephone Encounter (Signed)
Spoke with pt, aware she does not require antibiotics prior to procedure.

## 2013-05-23 NOTE — Telephone Encounter (Signed)
New problem   Pt need to speak to nurse concerning skin cancer sx. She want to know does she need to take antibiotic the day before. Please call pt.

## 2013-08-02 ENCOUNTER — Other Ambulatory Visit: Payer: Self-pay

## 2013-08-10 ENCOUNTER — Ambulatory Visit: Payer: Medicare Other | Admitting: Internal Medicine

## 2013-08-10 ENCOUNTER — Encounter: Payer: Self-pay | Admitting: Internal Medicine

## 2013-08-10 VITALS — BP 182/64 | HR 72 | Temp 99.2°F | Resp 16 | Ht 63.0 in | Wt 96.2 lb

## 2013-08-10 DIAGNOSIS — J4489 Other specified chronic obstructive pulmonary disease: Secondary | ICD-10-CM

## 2013-08-10 DIAGNOSIS — M159 Polyosteoarthritis, unspecified: Secondary | ICD-10-CM

## 2013-08-10 DIAGNOSIS — S40012A Contusion of left shoulder, initial encounter: Secondary | ICD-10-CM

## 2013-08-10 DIAGNOSIS — S40022A Contusion of left upper arm, initial encounter: Secondary | ICD-10-CM

## 2013-08-10 DIAGNOSIS — I10 Essential (primary) hypertension: Secondary | ICD-10-CM

## 2013-08-10 DIAGNOSIS — E782 Mixed hyperlipidemia: Secondary | ICD-10-CM

## 2013-08-10 DIAGNOSIS — J449 Chronic obstructive pulmonary disease, unspecified: Secondary | ICD-10-CM

## 2013-08-10 DIAGNOSIS — R7309 Other abnormal glucose: Secondary | ICD-10-CM | POA: Insufficient documentation

## 2013-08-10 MED ORDER — TRAMADOL HCL 50 MG PO TABS: ORAL_TABLET | ORAL | Status: DC

## 2013-08-10 MED ORDER — MELOXICAM 15 MG PO TABS: ORAL_TABLET | ORAL | Status: DC

## 2013-08-10 NOTE — Patient Instructions (Signed)
   Shoulder Pain The shoulder is the joint that connects your arms to your body. The bones that form the shoulder joint include the upper arm bone (humerus), the shoulder blade (scapula), and the collarbone (clavicle). The top of the humerus is shaped like a ball and fits into a rather flat socket on the scapula (glenoid cavity). A combination of muscles and strong, fibrous tissues that connect muscles to bones (tendons) support your shoulder joint and hold the ball in the socket. Small, fluid-filled sacs (bursae) are located in different areas of the joint. They act as cushions between the bones and the overlying soft tissues and help reduce friction between the gliding tendons and the bone as you move your arm. Your shoulder joint allows a wide range of motion in your arm. This range of motion allows you to do things like scratch your back or throw a ball. However, this range of motion also makes your shoulder more prone to pain from overuse and injury. Causes of shoulder pain can originate from both injury and overuse and usually can be grouped in the following four categories:  Redness, swelling, and pain (inflammation) of the tendon (tendinitis) or the bursae (bursitis).  Instability, such as a dislocation of the joint.  Inflammation of the joint (arthritis).  Broken bone (fracture). HOME CARE INSTRUCTIONS   Apply ice to the sore area.  Put ice in a plastic bag.  Place a towel between your skin and the bag.  Leave the ice on for 15-20 minutes, 03-04 times per day for the first 2 days.  Stop using cold packs if they do not help with the pain.  If you have a shoulder sling or immobilizer, wear it as long as your caregiver instructs. Only remove it to shower or bathe. Move your arm as little as possible, but keep your hand moving to prevent swelling.  Squeeze a soft ball or foam pad as much as possible to help prevent swelling.  Only take over-the-counter or prescription medicines for  pain, discomfort, or fever as directed by your caregiver. SEEK MEDICAL CARE IF:   Your shoulder pain increases, or new pain develops in your arm, hand, or fingers.  Your hand or fingers become cold and numb.  Your pain is not relieved with medicines. SEEK IMMEDIATE MEDICAL CARE IF:   Your arm, hand, or fingers are numb or tingling.  Your arm, hand, or fingers are significantly swollen or turn white or blue. MAKE SURE YOU:   Understand these instructions.  Will watch your condition.  Will get help right away if you are not doing well or get worse. Document Released: 06/23/2005 Document Revised: 06/07/2012 Document Reviewed: 08/28/2011 [facility] Patient Information 2014 [employer].

## 2013-08-10 NOTE — Progress Notes (Signed)
Patient ID: Andrea [name], female   DOB: 07/30/1929, 79 y.o.   MRN: [id number]  HPI: Patient relates a fall about 1 week ago slipping in her bathroom ,- striking her shoulder . No LOC. No head injury. Has slight decrease ROM of rt. Shoulder. C/o pain primarily around the shoulder.  Medication List       ALPRAZolam 0.5 MG tablet  Commonly known as:  XANAX  Take 0.25 mg by mouth at bedtime as needed.     amLODipine 5 MG tablet  Commonly known as:  NORVASC  Take 5 mg by mouth daily.     atenolol 100 MG tablet  Commonly known as:  TENORMIN  Take 0.5 tablets (50 mg total) by mouth 2 (two) times daily.     atorvastatin 80 MG tablet  Commonly known as:  LIPITOR  Take 40 mg by mouth daily.     levothyroxine 50 MCG tablet  Commonly known as:  SYNTHROID, LEVOTHROID  Take 50 mcg by mouth daily. 1/2 tablet once daily, none on sunday     losartan 100 MG tablet  Commonly known as:  COZAAR  Take 100 mg by mouth daily.     pravastatin 20 MG tablet  Commonly known as:  PRAVACHOL  Take 10 mg by mouth daily.     triamterene-hydrochlorothiazide 75-50 MG per tablet  Commonly known as:  MAXZIDE  Take 0.5 tablets by mouth every other day.     vitamin C 500 MG tablet  Commonly known as:  ASCORBIC ACID  Take 500 mg by mouth daily.         Allergies  Allergen Reactions  . Sulfa Antibiotics      PMHx: Past Medical History  Diagnosis Date  . Palpitations   . HLD (hyperlipidemia)   . DJD (degenerative joint disease)   . Mild mitral regurgitation by prior echocardiogram   . Mild tricuspid regurgitation   . HTN (hypertension)     FHx: Reviewed / unchanged  SHx: Reviewed / unchanged  Systems Review:  generally negative except as above.    Filed Vitals:   08/10/13 1018  BP: 182/64  Pulse: 72  Temp: 99.2 F (37.3 C)  Resp: 16    Estimated body mass index is 17.05 kg/(m^2) as calculated from the following:   Height as of this encounter: _0  (1.6 m).   Weight as of this  encounter: 96 lb 3.2 oz (43.636 kg).  On Exam: Appears well nourished - in no distress. HEENT - Neg WNL Chest: Respirations nl, clear Cor: RRR w/o sig m,g,r. No edema. Peripheral pulses normal and equal  without edema.  Abdomen: Soft. Benign.  Musculoskeletal: Nl gait & station. Lt. Shoulder with slight decreased ROM, esp. with external rotation. Sl tender at anterior joint line. No deformity. Sensory-motor testing of LUE is WNL. Otherwise, full ROM all peripheral extremities with joint stability. She does have generalized decrease in muscle power, tone and bulk due to age and deconditioning.   Skin: negative-  exposed. Neuro: WNL. No focal deficits evident  Assessment and Plan:  1. Lt. Shoulder contusion   Advise graduated stretching ROM exercises as pain permits. Rx for anti-inflammatory with GI precautions and mild analgesic with sedative precautions. Return PRN as needed.

## 2013-09-11 ENCOUNTER — Other Ambulatory Visit: Payer: Self-pay | Admitting: Emergency Medicine

## 2013-09-11 MED ORDER — ALPRAZOLAM 0.5 MG PO TABS: 0.2500 mg | ORAL_TABLET | Freq: Every evening | ORAL | Status: DC | PRN

## 2013-10-01 ENCOUNTER — Emergency Department (HOSPITAL_COMMUNITY): Payer: Medicare Other

## 2013-10-01 ENCOUNTER — Encounter: Payer: Self-pay | Admitting: Physician Assistant

## 2013-10-01 ENCOUNTER — Encounter (HOSPITAL_COMMUNITY): Payer: Self-pay | Admitting: Emergency Medicine

## 2013-10-01 ENCOUNTER — Ambulatory Visit (INDEPENDENT_AMBULATORY_CARE_PROVIDER_SITE_OTHER): Payer: Medicare Other | Admitting: Physician Assistant

## 2013-10-01 ENCOUNTER — Inpatient Hospital Stay (HOSPITAL_COMMUNITY)
Admission: EM | Admit: 2013-10-01 | Discharge: 2013-10-05 | DRG: 194 | Disposition: A | Discharge status: Home Health | Payer: Medicare Other | Attending: Family Medicine | Admitting: Family Medicine

## 2013-10-01 VITALS — BP 100/58 | HR 80 | Temp 97.5°F | Resp 16 | Ht 63.0 in | Wt 97.0 lb

## 2013-10-01 DIAGNOSIS — J441 Chronic obstructive pulmonary disease with (acute) exacerbation: Secondary | ICD-10-CM

## 2013-10-01 DIAGNOSIS — E876 Hypokalemia: Secondary | ICD-10-CM | POA: Diagnosis present

## 2013-10-01 DIAGNOSIS — Z681 Body mass index (BMI) 19 or less, adult: Secondary | ICD-10-CM | POA: Diagnosis not present

## 2013-10-01 DIAGNOSIS — Z87891 Personal history of nicotine dependence: Secondary | ICD-10-CM

## 2013-10-01 DIAGNOSIS — E871 Hypo-osmolality and hyponatremia: Secondary | ICD-10-CM | POA: Diagnosis present

## 2013-10-01 DIAGNOSIS — I079 Rheumatic tricuspid valve disease, unspecified: Secondary | ICD-10-CM | POA: Diagnosis present

## 2013-10-01 DIAGNOSIS — R7309 Other abnormal glucose: Secondary | ICD-10-CM

## 2013-10-01 DIAGNOSIS — I10 Essential (primary) hypertension: Secondary | ICD-10-CM | POA: Diagnosis present

## 2013-10-01 DIAGNOSIS — M1990 Unspecified osteoarthritis, unspecified site: Secondary | ICD-10-CM

## 2013-10-01 DIAGNOSIS — E782 Mixed hyperlipidemia: Secondary | ICD-10-CM | POA: Diagnosis present

## 2013-10-01 DIAGNOSIS — J438 Other emphysema: Secondary | ICD-10-CM | POA: Diagnosis present

## 2013-10-01 DIAGNOSIS — E860 Dehydration: Secondary | ICD-10-CM

## 2013-10-01 DIAGNOSIS — J9600 Acute respiratory failure, unspecified whether with hypoxia or hypercapnia: Secondary | ICD-10-CM | POA: Diagnosis not present

## 2013-10-01 DIAGNOSIS — E039 Hypothyroidism, unspecified: Secondary | ICD-10-CM | POA: Diagnosis present

## 2013-10-01 DIAGNOSIS — I071 Rheumatic tricuspid insufficiency: Secondary | ICD-10-CM

## 2013-10-01 DIAGNOSIS — M159 Polyosteoarthritis, unspecified: Secondary | ICD-10-CM

## 2013-10-01 DIAGNOSIS — I059 Rheumatic mitral valve disease, unspecified: Secondary | ICD-10-CM | POA: Diagnosis present

## 2013-10-01 DIAGNOSIS — J189 Pneumonia, unspecified organism: Secondary | ICD-10-CM | POA: Diagnosis not present

## 2013-10-01 DIAGNOSIS — Z79899 Other long term (current) drug therapy: Secondary | ICD-10-CM

## 2013-10-01 DIAGNOSIS — J449 Chronic obstructive pulmonary disease, unspecified: Secondary | ICD-10-CM

## 2013-10-01 DIAGNOSIS — I959 Hypotension, unspecified: Secondary | ICD-10-CM | POA: Diagnosis present

## 2013-10-01 DIAGNOSIS — J9601 Acute respiratory failure with hypoxia: Secondary | ICD-10-CM

## 2013-10-01 DIAGNOSIS — J984 Other disorders of lung: Secondary | ICD-10-CM

## 2013-10-01 DIAGNOSIS — I340 Nonrheumatic mitral (valve) insufficiency: Secondary | ICD-10-CM

## 2013-10-01 LAB — CBC
HCT: 34.5 % — ABNORMAL LOW (ref 36.0–46.0)
HEMOGLOBIN: 11.4 g/dL — AB (ref 12.0–15.0)
MCH: 30.2 pg (ref 26.0–34.0)
MCHC: 33 g/dL (ref 30.0–36.0)
MCV: 91.5 fL (ref 78.0–100.0)
PLATELETS: 231 10*3/uL (ref 150–400)
RBC: 3.77 MIL/uL — ABNORMAL LOW (ref 3.87–5.11)
RDW: 13 % (ref 11.5–15.5)
WBC: 15.6 10*3/uL — ABNORMAL HIGH (ref 4.0–10.5)

## 2013-10-01 LAB — CBC WITH DIFFERENTIAL/PLATELET
BASOS PCT: 0 % (ref 0–1)
Basophils Absolute: 0 10*3/uL (ref 0.0–0.1)
Eosinophils Absolute: 0.2 10*3/uL (ref 0.0–0.7)
Eosinophils Relative: 1 % (ref 0–5)
HCT: 39.1 % (ref 36.0–46.0)
Hemoglobin: 13.1 g/dL (ref 12.0–15.0)
Lymphocytes Relative: 7 % — ABNORMAL LOW (ref 12–46)
Lymphs Abs: 1.4 10*3/uL (ref 0.7–4.0)
MCH: 30.3 pg (ref 26.0–34.0)
MCHC: 33.5 g/dL (ref 30.0–36.0)
MCV: 90.3 fL (ref 78.0–100.0)
Monocytes Absolute: 1.9 10*3/uL — ABNORMAL HIGH (ref 0.1–1.0)
Monocytes Relative: 9 % (ref 3–12)
NEUTROS PCT: 83 % — AB (ref 43–77)
Neutro Abs: 16.7 10*3/uL — ABNORMAL HIGH (ref 1.7–7.7)
PLATELETS: 272 10*3/uL (ref 150–400)
RBC: 4.33 MIL/uL (ref 3.87–5.11)
RDW: 12.8 % (ref 11.5–15.5)
WBC: 20.1 10*3/uL — ABNORMAL HIGH (ref 4.0–10.5)

## 2013-10-01 LAB — HEPATIC FUNCTION PANEL
ALBUMIN: 3.1 g/dL — AB (ref 3.5–5.2)
ALK PHOS: 109 U/L (ref 39–117)
ALT: 19 U/L (ref 0–35)
AST: 34 U/L (ref 0–37)
Bilirubin, Direct: <0.2 mg/dL (ref 0.0–0.3)
Total Bilirubin: 0.4 mg/dL (ref 0.3–1.2)
Total Protein: 7.7 g/dL (ref 6.0–8.3)

## 2013-10-01 LAB — BASIC METABOLIC PANEL
BUN: 40 mg/dL — AB (ref 6–23)
BUN: 39 mg/dL — ABNORMAL HIGH (ref 6–23)
CALCIUM: 8.9 mg/dL (ref 8.4–10.5)
CO2: 28 mEq/L (ref 19–32)
CO2: 28 mEq/L (ref 19–32)
Calcium: 9.7 mg/dL (ref 8.4–10.5)
Chloride: 88 mEq/L — ABNORMAL LOW (ref 96–112)
Chloride: 89 mEq/L — ABNORMAL LOW (ref 96–112)
Creatinine, Ser: 1.43 mg/dL — ABNORMAL HIGH (ref 0.50–1.10)
Creatinine, Ser: 1.26 mg/dL — ABNORMAL HIGH (ref 0.50–1.10)
GFR, EST AFRICAN AMERICAN: 38 mL/min — AB (ref >90)
GFR, EST AFRICAN AMERICAN: 45 mL/min — AB (ref >90)
GFR, EST NON AFRICAN AMERICAN: 33 mL/min — AB (ref >90)
GFR, EST NON AFRICAN AMERICAN: 39 mL/min — AB (ref >90)
Glucose, Bld: 127 mg/dL — ABNORMAL HIGH (ref 70–99)
Glucose, Bld: 193 mg/dL — ABNORMAL HIGH (ref 70–99)
POTASSIUM: 3.6 meq/L — AB (ref 3.7–5.3)
POTASSIUM: 3.5 meq/L — AB (ref 3.7–5.3)
SODIUM: 129 meq/L — AB (ref 137–147)
SODIUM: 128 meq/L — AB (ref 137–147)

## 2013-10-01 LAB — CG4 I-STAT (LACTIC ACID): LACTIC ACID, VENOUS: 1.17 mmol/L (ref 0.5–2.2)

## 2013-10-01 LAB — PHOSPHORUS: Phosphorus: 2.8 mg/dL (ref 2.3–4.6)

## 2013-10-01 LAB — MAGNESIUM: MAGNESIUM: 2.2 mg/dL (ref 1.5–2.5)

## 2013-10-01 MED ORDER — AZITHROMYCIN 500 MG PO TABS
500.0000 mg | ORAL_TABLET | ORAL | Status: DC
  Administered 2013-10-02 – 2013-10-05 (×4): 500 mg via ORAL
  Filled 2013-10-01 (×4): qty 1

## 2013-10-01 MED ORDER — PANTOPRAZOLE SODIUM 40 MG PO TBEC
80.0000 mg | DELAYED_RELEASE_TABLET | Freq: Every day | ORAL | Status: DC
  Administered 2013-10-01 – 2013-10-05 (×5): 80 mg via ORAL
  Filled 2013-10-01 (×5): qty 2

## 2013-10-01 MED ORDER — LEVOTHYROXINE SODIUM 25 MCG PO TABS
25.0000 ug | ORAL_TABLET | ORAL | Status: DC
  Administered 2013-10-02 – 2013-10-05 (×4): 25 ug via ORAL
  Filled 2013-10-01 (×5): qty 1

## 2013-10-01 MED ORDER — DEXTROSE 5 % IV SOLN
500.0000 mg | Freq: Once | INTRAVENOUS | Status: AC
  Administered 2013-10-01: 500 mg via INTRAVENOUS

## 2013-10-01 MED ORDER — ATENOLOL 50 MG PO TABS
50.0000 mg | ORAL_TABLET | Freq: Two times a day (BID) | ORAL | Status: DC
  Administered 2013-10-01 – 2013-10-02 (×2): 50 mg via ORAL
  Filled 2013-10-01 (×3): qty 1

## 2013-10-01 MED ORDER — LEVOTHYROXINE SODIUM 50 MCG PO TABS: 50.0000 ug | ORAL_TABLET | ORAL | Status: DC

## 2013-10-01 MED ORDER — DEXTROSE 5 % IV SOLN
1.0000 g | INTRAVENOUS | Status: DC
  Administered 2013-10-02 – 2013-10-04 (×3): 1 g via INTRAVENOUS
  Filled 2013-10-01 (×4): qty 10

## 2013-10-01 MED ORDER — MELOXICAM 7.5 MG PO TABS
7.5000 mg | ORAL_TABLET | Freq: Every day | ORAL | Status: DC
Start: 2013-10-02 — End: 2013-10-05
  Administered 2013-10-02 – 2013-10-05 (×4): 15 mg via ORAL
  Filled 2013-10-01 (×4): qty 2

## 2013-10-01 MED ORDER — POTASSIUM CHLORIDE CRYS ER 20 MEQ PO TBCR
40.0000 meq | EXTENDED_RELEASE_TABLET | Freq: Once | ORAL | Status: AC
  Administered 2013-10-01: 18:00:00 40 meq via ORAL
  Filled 2013-10-01: qty 2

## 2013-10-01 MED ORDER — AMLODIPINE BESYLATE 5 MG PO TABS
5.0000 mg | ORAL_TABLET | Freq: Two times a day (BID) | ORAL | Status: DC
  Administered 2013-10-01 – 2013-10-02 (×2): 5 mg via ORAL
  Filled 2013-10-01 (×3): qty 1

## 2013-10-01 MED ORDER — SODIUM CHLORIDE 0.9 % IV BOLUS (SEPSIS)
1000.0000 mL | Freq: Once | INTRAVENOUS | Status: AC
  Administered 2013-10-01: 1000 mL via INTRAVENOUS

## 2013-10-01 MED ORDER — VITAMIN C 500 MG PO TABS
500.0000 mg | ORAL_TABLET | Freq: Every day | ORAL | Status: DC
  Administered 2013-10-01 – 2013-10-05 (×5): 500 mg via ORAL
  Filled 2013-10-01 (×5): qty 1

## 2013-10-01 MED ORDER — ALPRAZOLAM 0.25 MG PO TABS
0.2500 mg | ORAL_TABLET | Freq: Every evening | ORAL | Status: DC | PRN
  Administered 2013-10-02 – 2013-10-05 (×4): 0.25 mg via ORAL
  Filled 2013-10-01 (×4): qty 1

## 2013-10-01 MED ORDER — SODIUM CHLORIDE 0.9 % IV SOLN
INTRAVENOUS | Status: DC
  Administered 2013-10-01 – 2013-10-05 (×5): via INTRAVENOUS

## 2013-10-01 MED ORDER — HEPARIN SODIUM (PORCINE) 5000 UNIT/ML IJ SOLN
5000.0000 [IU] | Freq: Three times a day (TID) | INTRAMUSCULAR | Status: DC
  Administered 2013-10-01 – 2013-10-05 (×11): 5000 [IU] via SUBCUTANEOUS
  Filled 2013-10-01 (×14): qty 1

## 2013-10-01 MED ORDER — DEXTROSE 5 % IV SOLN
2.0000 g | Freq: Once | INTRAVENOUS | Status: AC
  Administered 2013-10-01: 2 g via INTRAVENOUS
  Filled 2013-10-01 (×2): qty 2

## 2013-10-01 MED ORDER — MAGNESIUM GLUCONATE 500 MG PO TABS
500.0000 mg | ORAL_TABLET | Freq: Two times a day (BID) | ORAL | Status: DC
  Administered 2013-10-01 – 2013-10-05 (×8): 500 mg via ORAL
  Filled 2013-10-01 (×9): qty 1

## 2013-10-01 MED ORDER — TRAMADOL HCL 50 MG PO TABS: 50.0000 mg | ORAL_TABLET | Freq: Two times a day (BID) | ORAL | Status: DC | PRN

## 2013-10-01 NOTE — ED Notes (Signed)
Patient transported to X-ray 

## 2013-10-01 NOTE — ED Notes (Signed)
Patient transported to CT 

## 2013-10-01 NOTE — Care Management Note (Addendum)
    Page 1 of 2   10/05/2013     1:49:17 PM   CARE MANAGEMENT NOTE 10/05/2013  Patient:  CHRISTELLA, APP   Account Number:  [id number]  Date Initiated:  10/01/2013  Documentation initiated by:  Dessa Phi  Subjective/Objective Assessment:   80 Y/O F ADMITTED W/SOB,RESP FAILURE.KI:[id number].     Action/Plan:   FROM HOME W/SPOUSE.   Anticipated DC Date:  10/05/2013   Anticipated DC Plan:  [place]  CM consult      Choice offered to / List presented to:  C-1 Patient   DME arranged  [facility]      DME agency  [facility] arranged  [place].   Status of service:  Completed, signed off Medicare Important Message given?   (If response is "NO", the following Medicare IM given date fields will be blank) Date Medicare IM given:   Date Additional Medicare IM given:    Discharge Disposition:  [place]  Per UR Regulation:  Reviewed for med. necessity/level of care/duration of stay  If discussed at Long Length of Stay Meetings, dates discussed:    Comments:  10/05/13 Melaine Mcphee RN,BSN NCM [phone] PATIENT FOR D/C TODAY HOME [place] 02.[facility] KRISTEN REP AWARE OF HHC ORDERS,& D/C.HOME 02 TO BE BROUGHT TO [facility].  PT-HH.[facility] CHOSEN FOR HH.[facility] KRISTEN REP AWARE OF REFERRAL.AWAIT FINAL HHPT,RW ORDER.NOTED 02 SATS 88%ON RA.IF HOME 02 NEEDED CAN ARRANGE W/ORDERS.  10/01/13 Jonn Chaikin RN,BSN NCM [phone]

## 2013-10-01 NOTE — Progress Notes (Signed)
Subjective:    Patient ID: Andrea [name], female    DOB: September 19, 1929, 80 y.o.   MRN: [id number]  Cough This is a new problem. The problem has been gradually worsening. The problem occurs constantly. The cough is productive of purulent sputum. Associated symptoms include chills, myalgias, shortness of breath and wheezing. Pertinent negatives include no chest pain, ear congestion, ear pain, fever, headaches, heartburn, hemoptysis, nasal congestion, postnasal drip, rhinorrhea, sore throat, sweats or weight loss. She has tried nothing for the symptoms. The treatment provided no relief. Her past medical history is significant for COPD.   Decreased eating/drinking, + weakness, and dizziness.   Current Outpatient Prescriptions on File Prior to Visit  Medication Sig Dispense Refill  . ALPRAZolam (XANAX) 0.5 MG tablet Take 0.5 tablets (0.25 mg total) by mouth at bedtime as needed.  30 tablet  0  . amLODipine (NORVASC) 5 MG tablet Take 5 mg by mouth daily.      . Ascorbic Acid (VITAMIN C) 500 MG tablet Take 500 mg by mouth daily.        [name] atenolol (TENORMIN) 100 MG tablet Take 0.5 tablets (50 mg total) by mouth 2 (two) times daily.  30 tablet  6  . levothyroxine (SYNTHROID, LEVOTHROID) 50 MCG tablet Take 50 mcg by mouth daily. 1/2 tablet once daily, none on sunday      . losartan (COZAAR) 100 MG tablet Take 100 mg by mouth daily.      . pravastatin (PRAVACHOL) 20 MG tablet Take 10 mg by mouth daily.      . traMADol (ULTRAM) 50 MG tablet 1 tab q4h prn pain  50 tablet  0  . triamterene-hydrochlorothiazide (MAXZIDE) 75-50 MG per tablet Take 0.5 tablets by mouth every other day.        No current facility-administered medications on file prior to visit.   Past Medical History  Diagnosis Date  . Palpitations   . HLD (hyperlipidemia)   . DJD (degenerative joint disease)   . Mild mitral regurgitation by prior echocardiogram   . Mild tricuspid regurgitation   . HTN (hypertension)     Review of  Systems  Constitutional: Positive for chills and fatigue. Negative for fever and weight loss.  HENT: Negative for congestion, ear pain, postnasal drip, rhinorrhea, sinus pressure and sore throat.   Eyes: Negative.   Respiratory: Positive for cough, shortness of breath and wheezing. Negative for hemoptysis and chest tightness.   Cardiovascular: Negative.  Negative for chest pain.  Gastrointestinal: Negative.  Negative for heartburn.  Genitourinary: Negative.   Musculoskeletal: Positive for myalgias.  Neurological: Negative.  Negative for headaches.       Objective:   Physical Exam  Constitutional: She is oriented to person, place, and time. She appears cachectic. She has a sickly appearance.  HENT:  Head: Normocephalic and atraumatic.  Right Ear: External ear normal.  Left Ear: External ear normal.  Nose: Nose normal.  Mouth/Throat: Oropharynx is clear and moist.  Eyes: Conjunctivae are normal. Pupils are equal, round, and reactive to light.  Neck: Normal range of motion. Neck supple.  Cardiovascular: Normal rate and regular rhythm.   Pulmonary/Chest: Effort normal. No respiratory distress. She has wheezes. She has no rales. She exhibits no tenderness.  Diffuse decreased breath sounds especially right lower lobe.   Abdominal: Soft. Bowel sounds are normal.  Lymphadenopathy:    She has no cervical adenopathy.  Neurological: She is alert and oriented to person, place, and time.  Skin: Skin is warm  and dry.      Assessment & Plan:  COPD exacerbation with dehydration With hypotension, weakness, decreased breath sounds, and her oxygen saturation of 89-92% patient was instructed to go to the ER for fluids and evaluation for possible admission.  Her husband is with her and she will go to [facility]

## 2013-10-01 NOTE — H&P (Signed)
[facility] History and Physical  [name] PPJ:[id number] DOB: [birth year] DOA: 10/01/2013  Referring physician: Dr. Regenia Skeeter PCP: Alesia Richards, MD   Chief Complaint: Worsening weakness and shortness of breath  HPI: Andrea [name] is a 80 y.o. female  With reported history of emphysema not on any medication at home per family and patient reports. Who presented to the ED complaining of worsening shortness of breath. The problem started 5 days ago and since onset has been persistent and getting worse. Reportedly patient was around family last week celebrating the holidays and was around sick contacts. A few of the other family developed upper respiratory symptoms. Given decrease in appetite, worsening weakness, and increasing shortness of breath patient was evaluated in the emergency department. Reportedly in the field was found to be hypoxic on room air with oxygen saturations in the 80s which resolved in the ED with supplemental oxygen via nasal cannula.   Evaluation in the ED with chest x-ray by mouth right lung pneumonia  Review of Systems:  Constitutional:  No weight loss, night sweats, Fevers, chills, fatigue.  HEENT:  No headaches, Difficulty swallowing,Tooth/dental problems,Sore throat,  No sneezing, itching, ear ache, nasal congestion, post nasal drip,  Cardio-vascular:  No chest pain, Orthopnea, PND, swelling in lower extremities, anasarca, dizziness, palpitations  GI:  No heartburn, indigestion, abdominal pain, nausea, vomiting, diarrhea, change in bowel habits, loss of appetite  Resp:  + shortness of breath with exertion or at rest. No excess mucus, no productive cough, + non-productive cough, No coughing up of blood.No change in color of mucus.No wheezing.No chest wall deformity  Skin:  no rash or lesions.  GU:  no dysuria, change in color of urine, no urgency or frequency. No flank pain.  Musculoskeletal:  No joint pain or swelling. No decreased range of  motion. No back pain.  Psych:  No change in mood or affect. No depression or anxiety. No memory loss.   Past Medical History  Diagnosis Date  . Palpitations   . HLD (hyperlipidemia)   . DJD (degenerative joint disease)   . Mild mitral regurgitation by prior echocardiogram   . Mild tricuspid regurgitation   . HTN (hypertension)    Past Surgical History  Procedure Laterality Date  . Abdominal hysterectomy  1993  . Cataract extraction, bilateral     Social History:  reports that she quit smoking about 39 years ago. Her smoking use included Cigarettes. She has a 20 pack-year smoking history. She has never used smokeless tobacco. She reports that she does not drink alcohol or use illicit drugs.  Allergies  Allergen Reactions  . Sulfa Antibiotics Other (See Comments)    Unknown    Family History  Problem Relation Age of Onset  . Cancer Father   . Cancer Mother      Prior to Admission medications   Medication Sig Start Date End Date Taking? Authorizing Provider  ALPRAZolam Duanne Moron) 0.5 MG tablet Take 0.5 tablets (0.25 mg total) by mouth at bedtime as needed. 09/11/13  Yes Melissa R Smith, PA-C  ALPRAZolam (XANAX) 0.5 MG tablet Take 0.25 mg by mouth at bedtime as needed for anxiety or sleep.   Yes Historical Provider, MD  amLODipine (NORVASC) 5 MG tablet Take 5 mg by mouth 2 (two) times daily.  11/23/12  Yes Lelon Perla, MD  Ascorbic Acid (VITAMIN C) 500 MG tablet Take 500 mg by mouth daily.     Yes Historical Provider, MD  atenolol (TENORMIN) 100 MG tablet Take  0.5 tablets (50 mg total) by mouth 2 (two) times daily. 02/27/13  Yes Lelon Perla, MD  cholecalciferol (VITAMIN D) 1000 UNITS tablet Take 1,000 Units by mouth 2 (two) times daily.   Yes Historical Provider, MD  levothyroxine (SYNTHROID, LEVOTHROID) 50 MCG tablet Take 50 mcg by mouth as directed. Take 0.5 tablets every day except Sunday.   Yes Historical Provider, MD  losartan (COZAAR) 100 MG tablet Take 100 mg by mouth  daily.   Yes Historical Provider, MD  magnesium gluconate (MAGONATE) 500 MG tablet Take 500 mg by mouth 2 (two) times daily.   Yes Historical Provider, MD  meloxicam (MOBIC) 15 MG tablet Take 7.5-15 mg by mouth daily.   Yes Historical Provider, MD  omeprazole (PRILOSEC) 40 MG capsule Take 40 mg by mouth daily.   Yes Historical Provider, MD  traMADol (ULTRAM) 50 MG tablet Take 50 mg by mouth every 4 (four) hours as needed for moderate pain or severe pain.   Yes Historical Provider, MD  triamterene-hydrochlorothiazide (MAXZIDE) 75-50 MG per tablet Take 0.5 tablets by mouth every other day.    Yes Historical Provider, MD   Physical Exam: Filed Vitals:   10/01/13 1146  BP: 120/60  Pulse: 93  Temp: 97.4 F (36.3 C)  Resp: 24    BP 120/60  Pulse 93  Temp(Src) 97.4 F (36.3 C) (Oral)  Resp 24  SpO2 81%  General:  Appears calm and comfortable Eyes: PERRL, normal lids, irises & conjunctiva ENT: grossly normal hearing, lips & tongue Neck: no LAD, masses or thyromegaly Cardiovascular: RRR, no m/r/g. No LE edema. Telemetry: SR, no arrhythmias  Respiratory: CTA bilaterally, no w/r/r. Normal respiratory effort. Abdomen: soft, ntnd Skin: no rash or induration seen on limited exam Musculoskeletal: grossly normal tone BUE/BLE Psychiatric: grossly normal mood and affect, speech fluent and appropriate Neurologic: grossly non-focal.          Labs on Admission:  Basic Metabolic Panel:  Recent Labs Lab 10/01/13 1208  NA 129*  K 3.6*  CL 88*  CO2 28  GLUCOSE 127*  BUN 40*  CREATININE 1.43*  CALCIUM 9.7   Liver Function Tests:  Recent Labs Lab 10/01/13 1208  AST 34  ALT 19  ALKPHOS 109  BILITOT 0.4  PROT 7.7  ALBUMIN 3.1*   No results found for this basename: LIPASE, AMYLASE,  in the last 168 hours No results found for this basename: AMMONIA,  in the last 168 hours CBC:  Recent Labs Lab 10/01/13 1208  WBC 20.1*  NEUTROABS 16.7*  HGB 13.1  HCT 39.1  MCV 90.3  PLT  272   Cardiac Enzymes: No results found for this basename: CKTOTAL, CKMB, CKMBINDEX, TROPONINI,  in the last 168 hours  BNP (last 3 results) No results found for this basename: PROBNP,  in the last 8760 hours CBG: No results found for this basename: GLUCAP,  in the last 168 hours  Radiological Exams on Admission: Dg Chest 2 View  10/01/2013   CLINICAL DATA:  Cough for 4 days.  EXAM: CHEST  2 VIEW  COMPARISON:  CT chest from 07/03/2012.  Chest x-ray from 04/29/2010.  FINDINGS: Lungs are hyperexpanded, consistent with emphysema. There is airspace disease in the right base suggesting pneumonia. There may be an associated small right pleural effusion. Cardiopericardial silhouette is at upper limits of normal for size. Bones are diffusely demineralized with chronic rotator cuff disease in the right shoulder.  IMPRESSION: Emphysema with airspace disease in the right base suggesting pneumonia.  Electronically Signed   By: Misty Stanley M.D.   On: 10/01/2013 12:49   Dg Humerus Left  10/01/2013   CLINICAL DATA:  Fall.  EXAM: LEFT HUMERUS - 2+ VIEW  COMPARISON:  None.  FINDINGS: Diffuse osteopenia.  No evidence of fracture or dislocation.  IMPRESSION: No acute abnormality.   Electronically Signed   By: Marcello Moores  Register   On: 10/01/2013 13:54    EKG: Independently reviewed. Sinus rhythm with atrial premature complexes  With no ST elevations or depressions with some artifact  Assessment/Plan Principal Problem:   Acute respiratory failure with hypoxia - Supportive therapy with supplemental oxygen - Most likely secondary to active lung infection - Will obtain lab tests to assess whether this is viral or bacterial (order placed for sputum culture and urinary antigens) - Treat empirically with IV Rocephin and azithromycin  Active Problems:   HYPERTENSION - Given hyponatremia we'll hold hydrochlorothiazide - Serum creatinine elevated as well as such will hold ARB - Otherwise continue home regimen and  continue to monitor blood pressures closely    Mixed hyperlipidemia -Stable    Hypokalemia - Will replace orally and reassess potassium levels next a.m.    Hyponatremia - Most likely secondary to decreased oral solute intake. But hydrochlorothiazide most likely contributing as such will discontinue. - We'll administer IV normal saline with gentle fluid hydration and reassess next a.m. - Will check sodium levels this evening to assess whether to continue normal saline or discontinue.  Hypothyroidism -Stable continue home regimen.   Code Status: Full Family Communication: Discussed with daughter at bedside Disposition Plan: Pending improvement in respiratory condition back to baseline  Time spent: More than 50 minutes  Velvet Bathe [facility] Pager [phone]

## 2013-10-01 NOTE — ED Provider Notes (Signed)
CSN: [id number]     Arrival date & time 10/01/13  1136 History   First MD Initiated Contact with Patient 10/01/13 1157     Chief Complaint  Patient presents with  . Weakness   (Consider location/radiation/quality/duration/timing/severity/associated sxs/prior Treatment) HPI Comments: 80 year old female presents with worsening weakness over the past 5 days. She feels that she's been having the flu. She's been having some congestion and a mildly productive cough with "dark sputum". He not been particularly short of breath but has not been getting out of bed much. She's had significant decreased by mouth intake both fluids and food. She is also urinating less often. She's not had any vomiting or chest pain. She saw her doctor today who recommended she come to the ER the patient low blood pressure and low oxygen saturations.   Past Medical History  Diagnosis Date  . Palpitations   . HLD (hyperlipidemia)   . DJD (degenerative joint disease)   . Mild mitral regurgitation by prior echocardiogram   . Mild tricuspid regurgitation   . HTN (hypertension)    Past Surgical History  Procedure Laterality Date  . Abdominal hysterectomy  1993   Family History  Problem Relation Age of Onset  . Cancer Father   . Cancer Mother    History  Substance Use Topics  . Smoking status: Former Smoker -- 1.00 packs/day for 20 years    Types: Cigarettes    Quit date: 09/27/1974  . Smokeless tobacco: Never Used  . Alcohol Use: No   OB History   Grav Para Term Preterm Abortions TAB SAB Ect Mult Living                 Review of Systems  Constitutional: Positive for fatigue. Negative for fever, chills and diaphoresis.  HENT: Positive for congestion.   Respiratory: Positive for cough. Negative for shortness of breath.   Cardiovascular: Negative for chest pain.  Gastrointestinal: Negative for vomiting and abdominal pain.  Genitourinary: Positive for decreased urine volume. Negative for dysuria.   Musculoskeletal: Negative for myalgias.  Neurological: Positive for weakness.  All other systems reviewed and are negative.    Allergies  Sulfa antibiotics  Home Medications   Current Outpatient Rx  Name  Route  Sig  Dispense  Refill  . ALPRAZolam (XANAX) 0.5 MG tablet   Oral   Take 0.5 tablets (0.25 mg total) by mouth at bedtime as needed.   30 tablet   0   . amLODipine (NORVASC) 5 MG tablet   Oral   Take 5 mg by mouth daily.         . Ascorbic Acid (VITAMIN C) 500 MG tablet   Oral   Take 500 mg by mouth daily.           [name] atenolol (TENORMIN) 100 MG tablet   Oral   Take 0.5 tablets (50 mg total) by mouth 2 (two) times daily.   30 tablet   6   . levothyroxine (SYNTHROID, LEVOTHROID) 50 MCG tablet   Oral   Take 50 mcg by mouth daily. 1/2 tablet once daily, none on sunday         . losartan (COZAAR) 100 MG tablet   Oral   Take 100 mg by mouth daily.         . pravastatin (PRAVACHOL) 20 MG tablet   Oral   Take 10 mg by mouth daily.         . traMADol (ULTRAM) 50 MG tablet  1 tab q4h prn pain   50 tablet   0   . triamterene-hydrochlorothiazide (MAXZIDE) 75-50 MG per tablet   Oral   Take 0.5 tablets by mouth every other day.           BP 120/60  Pulse 93  Temp(Src) 97.4 F (36.3 C) (Oral)  Resp 24  SpO2 81% Physical Exam  Nursing note and vitals reviewed. Constitutional: She is oriented to person, place, and time. No distress.  Acute on chronic ill appearing  HENT:  Head: Normocephalic and atraumatic.  Right Ear: External ear normal.  Left Ear: External ear normal.  Nose: Nose normal.  Dry mucous membranes  Eyes: Right eye exhibits no discharge. Left eye exhibits no discharge.  Cardiovascular: Normal rate, regular rhythm and normal heart sounds.   Pulmonary/Chest: Effort normal. No accessory muscle usage. Not tachypneic. No respiratory distress. She has no wheezes. She has rales in the right middle field and the right lower field.   Abdominal: Soft. She exhibits no distension. There is no tenderness.  Neurological: She is alert and oriented to person, place, and time.  Skin: Skin is warm and dry.    ED Course  Procedures (including critical care time) Labs Review Labs Reviewed  CBC WITH DIFFERENTIAL - Abnormal; Notable for the following:    WBC 20.1 (*)    Neutrophils Relative % 83 (*)    Neutro Abs 16.7 (*)    Lymphocytes Relative 7 (*)    Monocytes Absolute 1.9 (*)    All other components within normal limits  BASIC METABOLIC PANEL - Abnormal; Notable for the following:    Sodium 129 (*)    Potassium 3.6 (*)    Chloride 88 (*)    Glucose, Bld 127 (*)    BUN 40 (*)    Creatinine, Ser 1.43 (*)    GFR calc non Af Amer 33 (*)    GFR calc Af Amer 38 (*)    All other components within normal limits  HEPATIC FUNCTION PANEL - Abnormal; Notable for the following:    Albumin 3.1 (*)    All other components within normal limits  CULTURE, BLOOD (ROUTINE X 2)  CULTURE, BLOOD (ROUTINE X 2)  URINALYSIS, ROUTINE W REFLEX MICROSCOPIC  CG4 I-STAT (LACTIC ACID)   Imaging Review Dg Chest 2 View  10/01/2013   CLINICAL DATA:  Cough for 4 days.  EXAM: CHEST  2 VIEW  COMPARISON:  CT chest from 07/03/2012.  Chest x-ray from 04/29/2010.  FINDINGS: Lungs are hyperexpanded, consistent with emphysema. There is airspace disease in the right base suggesting pneumonia. There may be an associated small right pleural effusion. Cardiopericardial silhouette is at upper limits of normal for size. Bones are diffusely demineralized with chronic rotator cuff disease in the right shoulder.  IMPRESSION: Emphysema with airspace disease in the right base suggesting pneumonia.   Electronically Signed   By: Misty Stanley M.D.   On: 10/01/2013 12:49    EKG Interpretation   None       MDM   1. Community acquired pneumonia   2. Dehydration    She appears chronically ill but does appear dehydrated and has saturations in the mid 80s. This  brought up to normal range with 2 L of oxygen. She's not in any respiratory distress. However given her hypoxia and dehydration she will need inpatient admission for IV antibiotics, IV fluids, and oxygen support.    Ephraim Hamburger, MD 10/01/13 1346

## 2013-10-01 NOTE — ED Notes (Signed)
Pt states that she thinks that she has copd and that she thinks that she has some phlem coming up. HOH not a good historian. SATS 81% RA.

## 2013-10-01 NOTE — ED Notes (Addendum)
Pt from primary doctor was sent over here by PCP. Pt's husband states they never really told them why she needed to come here. Pt saturations are 86 on room air. Pt denies n/v/d ans SOB. Pt states she went to PCP "Becasue she felt worse with age". Pt states she does feel weak.

## 2013-10-02 DIAGNOSIS — J9600 Acute respiratory failure, unspecified whether with hypoxia or hypercapnia: Secondary | ICD-10-CM

## 2013-10-02 LAB — BASIC METABOLIC PANEL
BUN: 35 mg/dL — AB (ref 6–23)
CALCIUM: 8.5 mg/dL (ref 8.4–10.5)
CO2: 26 meq/L (ref 19–32)
Chloride: 98 mEq/L (ref 96–112)
Creatinine, Ser: 1.22 mg/dL — ABNORMAL HIGH (ref 0.50–1.10)
GFR calc Af Amer: 46 mL/min — ABNORMAL LOW (ref >90)
GFR, EST NON AFRICAN AMERICAN: 40 mL/min — AB (ref >90)
GLUCOSE: 103 mg/dL — AB (ref 70–99)
Potassium: 4.5 mEq/L (ref 3.7–5.3)
SODIUM: 133 meq/L — AB (ref 137–147)

## 2013-10-02 LAB — LEGIONELLA ANTIGEN, URINE: Legionella Antigen, Urine: NEGATIVE

## 2013-10-02 LAB — CBC
HCT: 34.4 % — ABNORMAL LOW (ref 36.0–46.0)
Hemoglobin: 11.1 g/dL — ABNORMAL LOW (ref 12.0–15.0)
MCH: 29.5 pg (ref 26.0–34.0)
MCHC: 32.3 g/dL (ref 30.0–36.0)
MCV: 91.5 fL (ref 78.0–100.0)
Platelets: 262 10*3/uL (ref 150–400)
RBC: 3.76 MIL/uL — AB (ref 3.87–5.11)
RDW: 13.2 % (ref 11.5–15.5)
WBC: 16.2 10*3/uL — ABNORMAL HIGH (ref 4.0–10.5)

## 2013-10-02 LAB — STREP PNEUMONIAE URINARY ANTIGEN: STREP PNEUMO URINARY ANTIGEN: NEGATIVE

## 2013-10-02 LAB — HIV ANTIBODY (ROUTINE TESTING W REFLEX): HIV: NONREACTIVE

## 2013-10-02 LAB — INFLUENZA PANEL BY PCR (TYPE A & B)
H1N1 flu by pcr: NOT DETECTED
INFLAPCR: NEGATIVE
Influenza B By PCR: NEGATIVE

## 2013-10-02 MED ORDER — SODIUM CHLORIDE 0.9 % IV BOLUS (SEPSIS)
500.0000 mL | Freq: Once | INTRAVENOUS | Status: AC
  Administered 2013-10-02: 20:00:00 500 mL via INTRAVENOUS

## 2013-10-02 MED ORDER — GLUCERNA SHAKE PO LIQD
237.0000 mL | Freq: Three times a day (TID) | ORAL | Status: DC
  Administered 2013-10-02 – 2013-10-05 (×8): 237 mL via ORAL
  Filled 2013-10-02 (×10): qty 237

## 2013-10-02 NOTE — Progress Notes (Signed)
[facility] PROGRESS NOTE  Andrea [name]:[id number] DOB: 06-Oct-1928 DOA: 10/01/2013 PCP: Alesia Richards, MD  Assessment/Plan: Principal Problem:  Acute respiratory failure with hypoxia: 2ary to CAP - Supportive therapy with supplemental oxygen  - Urine Legionella/strep antigen negative, influenza negative - Treat empirically with IV Rocephin and azithromycin  - Reassess CBC next a.m.  Active Problems:  HYPERTENSION / hypotension - Given hyponatremia we'll hold hydrochlorothiazide  - Serum creatinine elevated as well as such will hold ARB  - Otherwise continue home regimen and continue to monitor blood pressures closely  - Given soft blood pressure we'll give fluid bolus and discontinue blood pressure medications. Last blood pressure recorded 89/36 at 1338 which I did not get a call: I evaluated patient this afternoon and she was alert awake and responsive to questions  Mixed hyperlipidemia  -Stable   Hypokalemia  -Resolved after replacement.   Hyponatremia  -Improving with normal saline IV fluid administration. Suspect this should continue to improve with improved oral solute intake  Hypothyroidism  -Stable continue home regimen.  Code Status: full Family Communication: No family at bedside  Disposition Plan: Pending improvement in respiratory condition   Consultants:  None  Procedures:  None  Antibiotics:  Rocephin and azithromycin  HPI/Subjective: Patient reports feeling better today.  Objective: Filed Vitals:   10/02/13 1338  BP: 89/36  Pulse: 78  Temp: 97.2 F (36.2 C)  Resp: 24    Intake/Output Summary (Last 24 hours) at 10/02/13 1941 Last data filed at 10/02/13 1814  Gross per 24 hour  Intake   1560 ml  Output    450 ml  Net   1110 ml   Filed Weights   10/01/13 1539  Weight: 43.999 kg (97 lb)    Exam:   General: Patient in no acute distress, alert and awake,  Cardiovascular: Normal S1 and S2, no murmur  Respiratory:  Rales, no wheezes, nasal cannula in place  Abdomen: Soft, nontender  Musculoskeletal: No cyanosis, no clubbing   Data Reviewed: Basic Metabolic Panel:  Recent Labs Lab 10/01/13 1208 10/01/13 1623 10/02/13 0532  NA 129* 128* 133*  K 3.6* 3.5* 4.5  CL 88* 89* 98  CO2 _0 GLUCOSE 127* 193* 103*  BUN 40* 39* 35*  CREATININE 1.43* 1.26* 1.22*  CALCIUM 9.7 8.9 8.5  MG  --  2.2  --   PHOS  --  2.8  --    Liver Function Tests:  Recent Labs Lab 10/01/13 1208  AST 34  ALT 19  ALKPHOS 109  BILITOT 0.4  PROT 7.7  ALBUMIN 3.1*   No results found for this basename: LIPASE, AMYLASE,  in the last 168 hours No results found for this basename: AMMONIA,  in the last 168 hours CBC:  Recent Labs Lab 10/01/13 1208 10/01/13 1623 10/02/13 0532  WBC 20.1* 15.6* 16.2*  NEUTROABS 16.7*  --   --   HGB 13.1 11.4* 11.1*  HCT 39.1 34.5* 34.4*  MCV 90.3 91.5 91.5  PLT 272 231 262   Cardiac Enzymes: No results found for this basename: CKTOTAL, CKMB, CKMBINDEX, TROPONINI,  in the last 168 hours BNP (last 3 results) No results found for this basename: PROBNP,  in the last 8760 hours CBG: No results found for this basename: GLUCAP,  in the last 168 hours  Recent Results (from the past 240 hour(s))  CULTURE, BLOOD (ROUTINE X 2)     Status: None   Collection Time    10/01/13  1:15  PM      Result Value Range Status   Specimen Description BLOOD LEFT ANTECUBITAL   Final   Special Requests BOTTLES DRAWN AEROBIC AND ANAEROBIC 5ML   Final   Culture  Setup Time     Final   Value: 10/01/2013 16:58     Performed at [facility]   Culture     Final   Value:        BLOOD CULTURE RECEIVED NO GROWTH TO DATE CULTURE WILL BE HELD FOR 5 DAYS BEFORE ISSUING A FINAL NEGATIVE REPORT     Performed at [facility]   Report Status PENDING   Incomplete  CULTURE, BLOOD (ROUTINE X 2)     Status: None   Collection Time    10/01/13  1:50 PM      Result Value Range Status   Specimen  Description BLOOD RIGHT ANTECUBITAL   Final   Special Requests BOTTLES DRAWN AEROBIC AND ANAEROBIC 5 CC EA   Final   Culture  Setup Time     Final   Value: 10/01/2013 20:11     Performed at [facility]   Culture     Final   Value:        BLOOD CULTURE RECEIVED NO GROWTH TO DATE CULTURE WILL BE HELD FOR 5 DAYS BEFORE ISSUING A FINAL NEGATIVE REPORT     Performed at [facility]   Report Status PENDING   Incomplete  CULTURE, BLOOD (ROUTINE X 2)     Status: None   Collection Time    10/01/13  4:17 PM      Result Value Range Status   Specimen Description BLOOD LEFT ARM   Final   Special Requests BOTTLES DRAWN AEROBIC ONLY 5CC   Final   Culture  Setup Time     Final   Value: 10/01/2013 20:10     Performed at [facility]   Culture     Final   Value:        BLOOD CULTURE RECEIVED NO GROWTH TO DATE CULTURE WILL BE HELD FOR 5 DAYS BEFORE ISSUING A FINAL NEGATIVE REPORT     Performed at [facility]   Report Status PENDING   Incomplete  CULTURE, BLOOD (ROUTINE X 2)     Status: None   Collection Time    10/01/13  4:25 PM      Result Value Range Status   Specimen Description BLOOD RIGHT HAND   Final   Special Requests BOTTLES DRAWN AEROBIC ONLY 3CC   Final   Culture  Setup Time     Final   Value: 10/01/2013 20:12     Performed at [facility]   Culture     Final   Value:        BLOOD CULTURE RECEIVED NO GROWTH TO DATE CULTURE WILL BE HELD FOR 5 DAYS BEFORE ISSUING A FINAL NEGATIVE REPORT     Performed at [facility]   Report Status PENDING   Incomplete     Studies: Dg Chest 2 View  10/01/2013   CLINICAL DATA:  Cough for 4 days.  EXAM: CHEST  2 VIEW  COMPARISON:  CT chest from 07/03/2012.  Chest x-ray from 04/29/2010.  FINDINGS: Lungs are hyperexpanded, consistent with emphysema. There is airspace disease in the right base suggesting pneumonia. There may be an associated small right pleural effusion. Cardiopericardial silhouette is at upper  limits of normal for size. Bones are diffusely demineralized with  chronic rotator cuff disease in the right shoulder.  IMPRESSION: Emphysema with airspace disease in the right base suggesting pneumonia.   Electronically Signed   By: Misty Stanley M.D.   On: 10/01/2013 12:49   Dg Humerus Left  10/01/2013   CLINICAL DATA:  Fall.  EXAM: LEFT HUMERUS - 2+ VIEW  COMPARISON:  None.  FINDINGS: Diffuse osteopenia.  No evidence of fracture or dislocation.  IMPRESSION: No acute abnormality.   Electronically Signed   By: Marcello Moores  Register   On: 10/01/2013 13:54    Scheduled Meds: . amLODipine  5 mg Oral BID  . atenolol  50 mg Oral BID  . azithromycin  500 mg Oral Q24H  . cefTRIAXone (ROCEPHIN)  IV  1 g Intravenous Q24H  . feeding supplement (GLUCERNA SHAKE)  237 mL Oral TID WC  . heparin  5,000 Units Subcutaneous Q8H  . levothyroxine  25 mcg Oral Custom  . [START ON 10/07/2013] levothyroxine  50 mcg Oral Custom  . magnesium gluconate  500 mg Oral BID  . meloxicam  7.5-15 mg Oral Daily  . pantoprazole  80 mg Oral Daily  . sodium chloride  500 mL Intravenous Once  . vitamin C  500 mg Oral Daily   Continuous Infusions: . sodium chloride 75 mL/hr at 10/02/13 1311    Time spent: > 35 minutes    Velvet Bathe  [facility] Pager [phone]. If 7PM-7AM, please contact night-coverage at [email or web address], password [facility] 10/02/2013, 7:41 PM  LOS: 1 day

## 2013-10-02 NOTE — Progress Notes (Signed)
INITIAL NUTRITION ASSESSMENT  DOCUMENTATION CODES Per approved criteria  -Underweight   INTERVENTION: Recommend to add Glucerna shakes TID Recommend to liberalize diet to Regular (or Low Salt) to encourage PO intake and stabilize weight  NUTRITION DIAGNOSIS: Increased nutrient needs (protein/kcal) related to increased demand for nutrients as evidenced by underweight BMI.   Goal: Pt to meet >/= 90% of their estimated nutrition needs    Monitor:  Total protein/energy intake, labs, weights, diet order, glucose profile  Reason for Assessment: Underweight BMI  80 y.o. female  Admitting Dx: Acute respiratory failure with hypoxia  ASSESSMENT: Andrea [name] is a 80 y.o. female With reported history of emphysema not on any medication at home per family and patient reports. Who presented to the ED complaining of worsening shortness of breath. The problem started 5 days ago and since onset has been persistent and getting worse. Reportedly patient was around family last week celebrating the holidays and was around sick contacts. A few of the other family developed upper respiratory symptoms. Given decrease in appetite, worsening weakness, and increasing shortness of breath patient was evaluated in the emergency department  -Pt's family reported pt with decreased appetite. Has been concerned over heart healthy diet restrictions and high blood sugars,which resulted in overall intake significantly decreasing. Noted pt to have always been small framed, but is now unable to maintain weight. Was unable to quantify amount of weight pt has lost -Family requesting diabetic supplement Glucerna as they are concerned with pts elevated CBGs -Current PO intake approx 50%. Would benefit from diet liberalization d/t underweight status. Admitted with hypertension, low salt vs regular diet would be both appropriate to encourage appetite d/t current intake  Height: Ht Readings from Last 1 Encounters:  10/01/13  _0  (1.626 m)    Weight: Wt Readings from Last 1 Encounters:  10/01/13 97 lb (43.999 kg)    Ideal Body Weight: 120 lbs  % Ideal Body Weight: 82%  Wt Readings from Last 10 Encounters:  10/01/13 97 lb (43.999 kg)  10/01/13 97 lb (43.999 kg)  08/10/13 96 lb 3.2 oz (43.636 kg)  01/05/13 98 lb (44.453 kg)  11/06/12 104 lb 1.9 oz (47.229 kg)  08/11/11 106 lb 12.8 oz (48.444 kg)  01/19/11 110 lb (49.896 kg)  05/08/10 100 lb 6.1 oz (45.533 kg)    Usual Body Weight: 100 lbs  % Usual Body Weight: 97%  BMI:  Body mass index is 16.64 kg/(m^2). Underweight  Estimated Nutritional Needs: Kcal: 1350-1550 Protein: 50-60 gram Fluid: 1500 ml/daily  Skin: WDL  Diet Order: Cardiac  EDUCATION NEEDS: -No education needs identified at this time   Intake/Output Summary (Last 24 hours) at 10/02/13 1626 Last data filed at 10/02/13 1454  Gross per 24 hour  Intake 1546.25 ml  Output    250 ml  Net 1296.25 ml    Last BM: 1/04   Labs:   Recent Labs Lab 10/01/13 1208 10/01/13 1623 10/02/13 0532  NA 129* 128* 133*  K 3.6* 3.5* 4.5  CL 88* 89* 98  CO2 _1 BUN 40* 39* 35*  CREATININE 1.43* 1.26* 1.22*  CALCIUM 9.7 8.9 8.5  MG  --  2.2  --   PHOS  --  2.8  --   GLUCOSE 127* 193* 103*    CBG (last 3)  No results found for this basename: GLUCAP,  in the last 72 hours  Scheduled Meds: . amLODipine  5 mg Oral BID  . atenolol  50 mg  Oral BID  . azithromycin  500 mg Oral Q24H  . cefTRIAXone (ROCEPHIN)  IV  1 g Intravenous Q24H  . heparin  5,000 Units Subcutaneous Q8H  . levothyroxine  25 mcg Oral Custom  . [START ON 10/07/2013] levothyroxine  50 mcg Oral Custom  . magnesium gluconate  500 mg Oral BID  . meloxicam  7.5-15 mg Oral Daily  . pantoprazole  80 mg Oral Daily  . vitamin C  500 mg Oral Daily    Continuous Infusions: . sodium chloride 75 mL/hr at 10/02/13 1311    Past Medical History  Diagnosis Date  . Palpitations   . HLD (hyperlipidemia)   . DJD  (degenerative joint disease)   . Mild mitral regurgitation by prior echocardiogram   . Mild tricuspid regurgitation   . HTN (hypertension)     Past Surgical History  Procedure Laterality Date  . Abdominal hysterectomy  1993  . Cataract extraction, bilateral      Atlee Abide MS RD LDN Clinical Dietitian [phone]

## 2013-10-03 LAB — BASIC METABOLIC PANEL
BUN: 27 mg/dL — ABNORMAL HIGH (ref 6–23)
CALCIUM: 8.5 mg/dL (ref 8.4–10.5)
CO2: 25 mEq/L (ref 19–32)
Chloride: 101 mEq/L (ref 96–112)
Creatinine, Ser: 0.94 mg/dL (ref 0.50–1.10)
GFR calc Af Amer: 64 mL/min — ABNORMAL LOW (ref >90)
GFR calc non Af Amer: 55 mL/min — ABNORMAL LOW (ref >90)
GLUCOSE: 98 mg/dL (ref 70–99)
Potassium: 4.5 mEq/L (ref 3.7–5.3)
SODIUM: 135 meq/L — AB (ref 137–147)

## 2013-10-03 LAB — CBC
HCT: 34.8 % — ABNORMAL LOW (ref 36.0–46.0)
Hemoglobin: 11.2 g/dL — ABNORMAL LOW (ref 12.0–15.0)
MCH: 30.1 pg (ref 26.0–34.0)
MCHC: 32.2 g/dL (ref 30.0–36.0)
MCV: 93.5 fL (ref 78.0–100.0)
PLATELETS: 274 10*3/uL (ref 150–400)
RBC: 3.72 MIL/uL — AB (ref 3.87–5.11)
RDW: 13.2 % (ref 11.5–15.5)
WBC: 14.3 10*3/uL — ABNORMAL HIGH (ref 4.0–10.5)

## 2013-10-03 NOTE — Progress Notes (Signed)
[facility] PROGRESS NOTE  MARKIE HEFFERNAN UPB:[id number] DOB: 1929/05/16 DOA: 10/01/2013 PCP: Alesia Richards, MD  Assessment/Plan: Principal Problem:  Acute respiratory failure with hypoxia: 2ary to CAP - Supportive therapy with supplemental oxygen  - Urine Legionella/strep antigen negative, influenza negative - Treat empirically with IV Rocephin and azithromycin  - White blood cell count trending down we'll continue to monitor.  Active Problems:  HYPERTENSION  -Patient has had soft blood pressures and elevated serum creatinine as such hold blood pressure medications. Once blood pressures elevate we'll plan on continuing blood pressure medication.  Mixed hyperlipidemia  -Stable   Hypokalemia  -Resolved after replacement.   Hyponatremia  -Improved initially with normal saline IV fluid administration. Suspect this should continue to improve with improved oral solute intake   Hypothyroidism  -Stable continue home regimen.  Code Status: full Family Communication: No family at bedside  Disposition Plan: Pending improvement in respiratory condition   Consultants:  None  Procedures:  None  Antibiotics:  Rocephin and azithromycin  HPI/Subjective: Patient feels about the same today. No new complaints  Objective: Filed Vitals:   10/03/13 1709  BP: 114/40  Pulse:   Temp:   Resp:     Intake/Output Summary (Last 24 hours) at 10/03/13 1717 Last data filed at 10/03/13 0900  Gross per 24 hour  Intake 1412.5 ml  Output    850 ml  Net  562.5 ml   Filed Weights   10/01/13 1539  Weight: 43.999 kg (97 lb)    Exam:   General: Patient in no acute distress, alert and awake,  Cardiovascular: Normal S1 and S2, no murmur  Respiratory: Rales, no wheezes, nasal cannula in place  Abdomen: Soft, nontender  Musculoskeletal: No cyanosis, no clubbing   Data Reviewed: Basic Metabolic Panel:  Recent Labs Lab 10/01/13 1208 10/01/13 1623 10/02/13 0532  10/03/13 0506  NA 129* 128* 133* 135*  K 3.6* 3.5* 4.5 4.5  CL 88* 89* 98 101  CO2 _0 GLUCOSE 127* 193* 103* 98  BUN 40* 39* 35* 27*  CREATININE 1.43* 1.26* 1.22* 0.94  CALCIUM 9.7 8.9 8.5 8.5  MG  --  2.2  --   --   PHOS  --  2.8  --   --    Liver Function Tests:  Recent Labs Lab 10/01/13 1208  AST 34  ALT 19  ALKPHOS 109  BILITOT 0.4  PROT 7.7  ALBUMIN 3.1*   No results found for this basename: LIPASE, AMYLASE,  in the last 168 hours No results found for this basename: AMMONIA,  in the last 168 hours CBC:  Recent Labs Lab 10/01/13 1208 10/01/13 1623 10/02/13 0532 10/03/13 0506  WBC 20.1* 15.6* 16.2* 14.3*  NEUTROABS 16.7*  --   --   --   HGB 13.1 11.4* 11.1* 11.2*  HCT 39.1 34.5* 34.4* 34.8*  MCV 90.3 91.5 91.5 93.5  PLT 272 231 262 274   Cardiac Enzymes: No results found for this basename: CKTOTAL, CKMB, CKMBINDEX, TROPONINI,  in the last 168 hours BNP (last 3 results) No results found for this basename: PROBNP,  in the last 8760 hours CBG: No results found for this basename: GLUCAP,  in the last 168 hours  Recent Results (from the past 240 hour(s))  CULTURE, BLOOD (ROUTINE X 2)     Status: None   Collection Time    10/01/13  1:15 PM      Result Value Range Status   Specimen Description BLOOD LEFT  ANTECUBITAL   Final   Special Requests BOTTLES DRAWN AEROBIC AND ANAEROBIC 5ML   Final   Culture  Setup Time     Final   Value: 10/01/2013 16:58     Performed at [facility]   Culture     Final   Value:        BLOOD CULTURE RECEIVED NO GROWTH TO DATE CULTURE WILL BE HELD FOR 5 DAYS BEFORE ISSUING A FINAL NEGATIVE REPORT     Performed at [facility]   Report Status PENDING   Incomplete  CULTURE, BLOOD (ROUTINE X 2)     Status: None   Collection Time    10/01/13  1:50 PM      Result Value Range Status   Specimen Description BLOOD RIGHT ANTECUBITAL   Final   Special Requests BOTTLES DRAWN AEROBIC AND ANAEROBIC 5 CC EA   Final    Culture  Setup Time     Final   Value: 10/01/2013 20:11     Performed at [facility]   Culture     Final   Value:        BLOOD CULTURE RECEIVED NO GROWTH TO DATE CULTURE WILL BE HELD FOR 5 DAYS BEFORE ISSUING A FINAL NEGATIVE REPORT     Performed at [facility]   Report Status PENDING   Incomplete  CULTURE, BLOOD (ROUTINE X 2)     Status: None   Collection Time    10/01/13  4:17 PM      Result Value Range Status   Specimen Description BLOOD LEFT ARM   Final   Special Requests BOTTLES DRAWN AEROBIC ONLY 5CC   Final   Culture  Setup Time     Final   Value: 10/01/2013 20:10     Performed at [facility]   Culture     Final   Value:        BLOOD CULTURE RECEIVED NO GROWTH TO DATE CULTURE WILL BE HELD FOR 5 DAYS BEFORE ISSUING A FINAL NEGATIVE REPORT     Performed at [facility]   Report Status PENDING   Incomplete  CULTURE, BLOOD (ROUTINE X 2)     Status: None   Collection Time    10/01/13  4:25 PM      Result Value Range Status   Specimen Description BLOOD RIGHT HAND   Final   Special Requests BOTTLES DRAWN AEROBIC ONLY 3CC   Final   Culture  Setup Time     Final   Value: 10/01/2013 20:12     Performed at [facility]   Culture     Final   Value:        BLOOD CULTURE RECEIVED NO GROWTH TO DATE CULTURE WILL BE HELD FOR 5 DAYS BEFORE ISSUING A FINAL NEGATIVE REPORT     Performed at [facility]   Report Status PENDING   Incomplete     Studies: No results found.  Scheduled Meds: . azithromycin  500 mg Oral Q24H  . cefTRIAXone (ROCEPHIN)  IV  1 g Intravenous Q24H  . feeding supplement (GLUCERNA SHAKE)  237 mL Oral TID WC  . heparin  5,000 Units Subcutaneous Q8H  . levothyroxine  25 mcg Oral Custom  . [START ON 10/07/2013] levothyroxine  50 mcg Oral Custom  . magnesium gluconate  500 mg Oral BID  . meloxicam  7.5-15 mg Oral Daily  . pantoprazole  80 mg Oral Daily  . vitamin C  500 mg Oral Daily   Continuous Infusions: .  sodium chloride 75 mL/hr at 10/02/13 1311    Time spent: > 35 minutes    Velvet Bathe  [facility] Pager [phone]. If 7PM-7AM, please contact night-coverage at [email or web address], password [email or web address] 10/03/2013, 5:17 PM  LOS: 2 days

## 2013-10-03 NOTE — Evaluation (Signed)
Physical Therapy Evaluation Patient Details Name: Andrea [name] MRN: [id number] DOB: 14-Sep-1929 Today's Date: 10/03/2013 Time: 5027-7412 PT Time Calculation (min): 21 min  PT Assessment / Plan / Recommendation History of Present Illness  80 yo female admitted with pna, acute resp failure, weakness. Hx of fall injuring L shoulder ~Nov 20/14-negative xray per daughter/pt but has shoulder pain, limited ROM, decreased strength  Clinical Impression  On eval, pt required Min assist for mobility-able to ambulate ~115 with walker. Dyspnea 2-3/4 so did not remove O2 this session. Recommend HHPT, 24/7 supervision/assist. Pt also c/o L shoulder pain-reports fall ~2 months ago-has limited ROM, decreased strength. Daughter asking if MRI can be performed during hospital stay-left sticky note in chart for MD.     PT Assessment  Patient needs continued PT services    Follow Up Recommendations  Home health PT;Supervision/Assistance - 24 hour    Does the patient have the potential to tolerate intense rehabilitation      Barriers to Discharge        Equipment Recommendations  Rolling walker with 5" wheels    Recommendations for Other Services OT consult   Frequency Min 3X/week    Precautions / Restrictions Precautions Precautions: Fall Restrictions Weight Bearing Restrictions: No   Pertinent Vitals/Pain L shoulder with movement-unrated Ambulated on 3L O2.       Mobility  Bed Mobility Overal bed mobility: Needs Assistance Bed Mobility: Supine to Sit Supine to sit: Min guard Transfers Overall transfer level: Needs assistance Transfers: Sit to/from Stand Sit to Stand: Min assist. Assist to rise, stabilize, control descent Ambulation/Gait Ambulation/Gait assistance: Min assist Ambulation Distance (Feet): 115 Feet Assistive device: Rolling walker (2 wheeled)  Assist to stabilize and maneuver with walker. Ambulated on 3L O2. Dyspnea 2-3/4   Exercises     PT Diagnosis: Difficulty  walking;Generalized weakness  PT Problem List: Decreased strength;Decreased range of motion;Decreased activity tolerance;Decreased balance;Decreased mobility;Pain;Decreased knowledge of use of DME PT Treatment Interventions: DME instruction;Gait training;Functional mobility training;Therapeutic activities;Therapeutic exercise;Patient/family education;Balance training     PT Goals(Current goals can be found in the care plan section) Acute Rehab PT Goals Patient Stated Goal: home.  PT Goal Formulation: With patient/family Time For Goal Achievement: 10/17/13 Potential to Achieve Goals: Good  Visit Information  Last PT Received On: 10/03/13 Assistance Needed: +1 History of Present Illness: 80 yo female admitted with pna, acute resp failure, weakness. Hx of fall injuring L shoulder ~Nov 20/14-negative xray per daughter/pt but has shoulder pain, limited ROM, decreased strength       Prior [place] expects to be discharged to:: Private residence Living Arrangements: Spouse/significant other Available Help at Discharge: Family Type of Home: House Home Access: Stairs to enter Technical brewer of Steps: 3 Entrance Stairs-Rails: None Home Layout: One level Home Equipment: None Prior Function Level of Independence: Independent Communication Communication: No difficulties    Cognition  Cognition Arousal/Alertness: Awake/alert Behavior During Therapy: WFL for tasks assessed/performed Overall Cognitive Status: Within Functional Limits for tasks assessed    Extremity/Trunk Assessment Upper Extremity Assessment Upper Extremity Assessment: LUE deficits/detail LUE Deficits / Details: Limited ROM-AAROM to 90 degrees with pain. Decreased strength 3/5.  Lower Extremity Assessment Lower Extremity Assessment: Generalized weakness Cervical / Trunk Assessment Cervical / Trunk Assessment: Kyphotic   Balance    End of Session PT - End of Session Equipment  Utilized During Treatment: Gait belt;Oxygen Activity Tolerance: Patient limited by fatigue Patient left: with call bell/phone within reach;with family/visitor present (sitting EOB)  GP  Weston Anna, MPT Pager: [phone]

## 2013-10-04 LAB — CBC
HCT: 34.9 % — ABNORMAL LOW (ref 36.0–46.0)
Hemoglobin: 11 g/dL — ABNORMAL LOW (ref 12.0–15.0)
MCH: 29.6 pg (ref 26.0–34.0)
MCHC: 31.5 g/dL (ref 30.0–36.0)
MCV: 93.8 fL (ref 78.0–100.0)
PLATELETS: 328 10*3/uL (ref 150–400)
RBC: 3.72 MIL/uL — ABNORMAL LOW (ref 3.87–5.11)
RDW: 13.3 % (ref 11.5–15.5)
WBC: 12.2 10*3/uL — AB (ref 4.0–10.5)

## 2013-10-04 LAB — BASIC METABOLIC PANEL
BUN: 17 mg/dL (ref 6–23)
CHLORIDE: 105 meq/L (ref 96–112)
CO2: 26 mEq/L (ref 19–32)
Calcium: 8.8 mg/dL (ref 8.4–10.5)
Creatinine, Ser: 0.79 mg/dL (ref 0.50–1.10)
GFR calc non Af Amer: 75 mL/min — ABNORMAL LOW (ref >90)
GFR, EST AFRICAN AMERICAN: 87 mL/min — AB (ref >90)
Glucose, Bld: 98 mg/dL (ref 70–99)
POTASSIUM: 4.1 meq/L (ref 3.7–5.3)
SODIUM: 141 meq/L (ref 137–147)

## 2013-10-04 MED ORDER — VITAMINS A & D EX OINT
TOPICAL_OINTMENT | CUTANEOUS | Status: AC
  Administered 2013-10-04: 16:00:00
  Filled 2013-10-04: qty 5

## 2013-10-04 MED ORDER — FAMOTIDINE 20 MG PO TABS
20.0000 mg | ORAL_TABLET | Freq: Every day | ORAL | Status: DC | PRN
  Administered 2013-10-04: 20 mg via ORAL
  Filled 2013-10-04: qty 1

## 2013-10-04 NOTE — Progress Notes (Signed)
Physical Therapy Treatment Patient Details Name: Andrea [name] MRN: [id number] DOB: 02/08/1929 Today's Date: 10/04/2013 Time: 1751-0258 PT Time Calculation (min): 31 min  PT Assessment / Plan / Recommendation  History of Present Illness 80 yo female admitted with pna, acute resp failure, weakness. Hx of fall injuring L shoulder ~Nov 20/14-negative xray per daughter/pt but has shoulder pain, limited ROM, decreased strength   PT Comments   Pt in bed on 3 lts )2 with daughter in room.  Assisted pt OOB to amb to BR to void then amb in hallway.  RA tested at 75% so reapplied 3 lts.   Follow Up Recommendations  Home health PT;Supervision/Assistance - 24 hour     Does the patient have the potential to tolerate intense rehabilitation     Barriers to Discharge        Equipment Recommendations  Rolling walker with 5" wheels    Recommendations for Other Services    Frequency Min 3X/week   Progress towards PT Goals Progress towards PT goals: Progressing toward goals  Plan      Precautions / Restrictions Precautions Precautions: Fall Precaution Comments: monitor O2 sats Restrictions Weight Bearing Restrictions: No    Pertinent Vitals/Pain SATURATION QUALIFICATIONS: (This note is used to comply with regulatory documentation for home oxygen)  Patient Saturations on Room Air at Rest = 88%  Patient Saturations on Room Air while Ambulating = 75%  Patient Saturations on 3 Liters of oxygen while Ambulating = 91%  Please briefly explain why patient needs home oxygen:    Mobility  Bed Mobility Overal bed mobility: Needs Assistance Bed Mobility: Supine to Sit Supine to sit: Min guard General bed mobility comments: increased time Transfers Overall transfer level: Needs assistance Equipment used: None Transfers: Sit to/from Stand Sit to Stand: Min guard;Min assist General transfer comment: VC's on hand placement and safety with turns Ambulation/Gait Ambulation/Gait assistance: Min  guard;Min assist Ambulation Distance (Feet): 120 Feet Assistive device: Rolling walker (2 wheeled) Gait velocity: decreased General Gait Details: Pt normally does not use a RW due to vanity.  Advised she would benefit for increased stability.  Gait with AD was Mod unsteady as pt steadyed herself with counter and wall rail.     PT Goals (current goals can now be found in the care plan section)    Visit Information  Last PT Received On: 10/04/13 Assistance Needed: +1 History of Present Illness: 80 yo female admitted with pna, acute resp failure, weakness. Hx of fall injuring L shoulder ~Nov 20/14-negative xray per daughter/pt but has shoulder pain, limited ROM, decreased strength    Subjective Data      Cognition       Balance     End of Session PT - End of Session Equipment Utilized During Treatment: Gait belt;Oxygen Activity Tolerance: Patient limited by fatigue Patient left: with call bell/phone within reach;with family/visitor present   [name]  PTA [facility]  Acute  Rehab Pager      [phone]

## 2013-10-04 NOTE — Progress Notes (Signed)
[facility] PROGRESS NOTE  Andrea [name] NAT:[id number] DOB: 10/08/1928 DOA: 10/01/2013 PCP: Alesia Richards, MD  Assessment/Plan: Principal Problem:  Acute respiratory failure with hypoxia: 2ary to CAP - Supportive therapy with supplemental oxygen  - Urine Legionella/strep antigen negative, influenza negative - Treat empirically with IV Rocephin and azithromycin  - White blood cell count trending down we'll continue to monitor.  Active Problems:  HYPERTENSION  -Will continue to hold blood pressure medication patient normotensive  Mixed hyperlipidemia  -Stable   Hypokalemia  -Resolved after replacement.   Hyponatremia  -Improved initially with normal saline IV fluid administration. Suspect this should continue to improve with improved oral solute intake   Hypothyroidism  -Stable continue home regimen.  Code Status: full Family Communication: No family at bedside  Disposition Plan: Pending improvement in respiratory condition   Consultants:  None  Procedures:  None  Antibiotics:  Rocephin and azithromycin  HPI/Subjective: Patient feels about the same today. No new complaints  Objective: Filed Vitals:   10/04/13 1433  BP: 117/41  Pulse: 59  Temp: 97.2 F (36.2 C)  Resp: 18    Intake/Output Summary (Last 24 hours) at 10/04/13 1912 Last data filed at 10/04/13 1434  Gross per 24 hour  Intake 1252.5 ml  Output    450 ml  Net  802.5 ml   Filed Weights   10/01/13 1539  Weight: 43.999 kg (97 lb)    Exam:   General: Patient in no acute distress, alert and awake,  Cardiovascular: Normal S1 and S2, no murmur  Respiratory: Rales, no wheezes, nasal cannula in place  Abdomen: Soft, nontender  Musculoskeletal: No cyanosis, no clubbing   Data Reviewed: Basic Metabolic Panel:  Recent Labs Lab 10/01/13 1208 10/01/13 1623 10/02/13 0532 10/03/13 0506 10/04/13 0550  NA 129* 128* 133* 135* 141  K 3.6* 3.5* 4.5 4.5 4.1  CL 88* 89* 98  101 105  CO2 _0 GLUCOSE 127* 193* 103* 98 98  BUN 40* 39* 35* 27* 17  CREATININE 1.43* 1.26* 1.22* 0.94 0.79  CALCIUM 9.7 8.9 8.5 8.5 8.8  MG  --  2.2  --   --   --   PHOS  --  2.8  --   --   --    Liver Function Tests:  Recent Labs Lab 10/01/13 1208  AST 34  ALT 19  ALKPHOS 109  BILITOT 0.4  PROT 7.7  ALBUMIN 3.1*   No results found for this basename: LIPASE, AMYLASE,  in the last 168 hours No results found for this basename: AMMONIA,  in the last 168 hours CBC:  Recent Labs Lab 10/01/13 1208 10/01/13 1623 10/02/13 0532 10/03/13 0506 10/04/13 0550  WBC 20.1* 15.6* 16.2* 14.3* 12.2*  NEUTROABS 16.7*  --   --   --   --   HGB 13.1 11.4* 11.1* 11.2* 11.0*  HCT 39.1 34.5* 34.4* 34.8* 34.9*  MCV 90.3 91.5 91.5 93.5 93.8  PLT 272 231 262 274 328   Cardiac Enzymes: No results found for this basename: CKTOTAL, CKMB, CKMBINDEX, TROPONINI,  in the last 168 hours BNP (last 3 results) No results found for this basename: PROBNP,  in the last 8760 hours CBG: No results found for this basename: GLUCAP,  in the last 168 hours  Recent Results (from the past 240 hour(s))  CULTURE, BLOOD (ROUTINE X 2)     Status: None   Collection Time    10/01/13  1:15 PM  Result Value Range Status   Specimen Description BLOOD LEFT ANTECUBITAL   Final   Special Requests BOTTLES DRAWN AEROBIC AND ANAEROBIC 5ML   Final   Culture  Setup Time     Final   Value: 10/01/2013 16:58     Performed at [facility]   Culture     Final   Value:        BLOOD CULTURE RECEIVED NO GROWTH TO DATE CULTURE WILL BE HELD FOR 5 DAYS BEFORE ISSUING A FINAL NEGATIVE REPORT     Performed at [facility]   Report Status PENDING   Incomplete  CULTURE, BLOOD (ROUTINE X 2)     Status: None   Collection Time    10/01/13  1:50 PM      Result Value Range Status   Specimen Description BLOOD RIGHT ANTECUBITAL   Final   Special Requests BOTTLES DRAWN AEROBIC AND ANAEROBIC 5 CC EA   Final    Culture  Setup Time     Final   Value: 10/01/2013 20:11     Performed at [facility]   Culture     Final   Value:        BLOOD CULTURE RECEIVED NO GROWTH TO DATE CULTURE WILL BE HELD FOR 5 DAYS BEFORE ISSUING A FINAL NEGATIVE REPORT     Performed at [facility]   Report Status PENDING   Incomplete  CULTURE, BLOOD (ROUTINE X 2)     Status: None   Collection Time    10/01/13  4:17 PM      Result Value Range Status   Specimen Description BLOOD LEFT ARM   Final   Special Requests BOTTLES DRAWN AEROBIC ONLY 5CC   Final   Culture  Setup Time     Final   Value: 10/01/2013 20:10     Performed at [facility]   Culture     Final   Value:        BLOOD CULTURE RECEIVED NO GROWTH TO DATE CULTURE WILL BE HELD FOR 5 DAYS BEFORE ISSUING A FINAL NEGATIVE REPORT     Performed at [facility]   Report Status PENDING   Incomplete  CULTURE, BLOOD (ROUTINE X 2)     Status: None   Collection Time    10/01/13  4:25 PM      Result Value Range Status   Specimen Description BLOOD RIGHT HAND   Final   Special Requests BOTTLES DRAWN AEROBIC ONLY 3CC   Final   Culture  Setup Time     Final   Value: 10/01/2013 20:12     Performed at [facility]   Culture     Final   Value:        BLOOD CULTURE RECEIVED NO GROWTH TO DATE CULTURE WILL BE HELD FOR 5 DAYS BEFORE ISSUING A FINAL NEGATIVE REPORT     Performed at [facility]   Report Status PENDING   Incomplete     Studies: No results found.  Scheduled Meds: . azithromycin  500 mg Oral Q24H  . cefTRIAXone (ROCEPHIN)  IV  1 g Intravenous Q24H  . feeding supplement (GLUCERNA SHAKE)  237 mL Oral TID WC  . heparin  5,000 Units Subcutaneous Q8H  . levothyroxine  25 mcg Oral Custom  . [START ON 10/07/2013] levothyroxine  50 mcg Oral Custom  . magnesium gluconate  500 mg Oral BID  . meloxicam  7.5-15 mg Oral Daily  .  pantoprazole  80 mg Oral Daily  . vitamin C  500 mg Oral Daily   Continuous Infusions: .  sodium chloride 75 mL/hr at 10/04/13 1338    Time spent: > 35 minutes    Velvet Bathe  [facility] Pager [phone]. If 7PM-7AM, please contact night-coverage at [email or web address], password [facility] 10/04/2013, 7:12 PM  LOS: 3 days

## 2013-10-05 DIAGNOSIS — M159 Polyosteoarthritis, unspecified: Secondary | ICD-10-CM

## 2013-10-05 DIAGNOSIS — J449 Chronic obstructive pulmonary disease, unspecified: Secondary | ICD-10-CM

## 2013-10-05 MED ORDER — ALBUTEROL SULFATE HFA 108 (90 BASE) MCG/ACT IN AERS: 2.0000 | INHALATION_SPRAY | Freq: Four times a day (QID) | RESPIRATORY_TRACT | Status: DC | PRN

## 2013-10-05 MED ORDER — CEFDINIR 300 MG PO CAPS: 300.0000 mg | ORAL_CAPSULE | Freq: Two times a day (BID) | ORAL | Status: DC

## 2013-10-05 MED ORDER — AZITHROMYCIN 500 MG PO TABS: 500.0000 mg | ORAL_TABLET | ORAL | Status: DC

## 2013-10-05 MED ORDER — PSYLLIUM 95 % PO PACK
1.0000 | PACK | Freq: Every day | ORAL | Status: DC
Start: 2013-10-05 — End: 2013-10-05
  Administered 2013-10-05: 14:00:00 1 via ORAL
  Filled 2013-10-05: qty 1

## 2013-10-05 NOTE — Progress Notes (Signed)
Physical Therapy Treatment Patient Details Name: Andrea [name] MRN: [id number] DOB: 06/27/1929 Today's Date: 10/05/2013 Time: 1829-9371 PT Time Calculation (min): 28 min   SATURATION QUALIFICATIONS: (This note is used to comply with regulatory documentation for home oxygen)  Patient Saturations on Room Air at Rest = 91%  Patient Saturations on Room Air while Ambulating = 85%  Patient Saturations on 2 Liters of oxygen while Ambulating = 90%  Please briefly explain why patient needs home oxygen: pt recovering from PNA and still demonstrates mild dyspnea on excursion requiring O2      History of Present Illness 80 yo female admitted with pna, acute resp failure, weakness. Hx of fall injuring L shoulder ~Nov 20/14-negative xray per daughter/pt but has shoulder pain, limited ROM, decreased strength   PT Comments   Pt in bed on 2 lts O2 at rest 96%.  Assisted OOB and amb in hallway RA (see above).  Spouse present and assisted.  Pt plans to D/C to home with spouse.   Follow Up Recommendations  Home health PT;Supervision/Assistance - 24 hour  Home O2   Does the patient have the potential to tolerate intense rehabilitation     Barriers to Discharge        Equipment Recommendations  None recommended by PT  (Daughter bought a walker at [facility] [employer] yesterday spouse reports)    Recommendations for Other Services    Frequency Min 3X/week   Progress towards PT Goals Progress towards PT goals: Progressing toward goals  Plan      Precautions / Restrictions Precautions Precautions: Fall Precaution Comments: monitor O2 sats Restrictions Weight Bearing Restrictions: No        Mobility  Bed Mobility Overal bed mobility: Needs Assistance Bed Mobility: Supine to Sit;Sit to Supine Supine to sit: Supervision Sit to supine: Supervision Transfers Overall transfer level: Needs assistance Equipment used: Rolling walker (2 wheeled) (although pt prefers NOT to use anything) Sit to Stand:  Supervision General transfer comment: VC's on safety with turns using RW throughout turn Ambulation/Gait Ambulation/Gait assistance: Supervision Ambulation Distance (Feet): 120 Feet Assistive device: Rolling walker (2 wheeled) Gait Pattern/deviations: Step-through pattern Gait velocity: decreased General Gait Details: Pt normally does not use a RW due to vanity.  Advised she would benefit for increased stability.  Gait with AD was Mod unsteady as pt steadyed herself with counter and wall rail.      PT Goals (current goals can now be found in the care plan section)    Visit Information  Last PT Received On: 10/05/13 Assistance Needed: +1 History of Present Illness: 80 yo female admitted with pna, acute resp failure, weakness. Hx of fall injuring L shoulder ~Nov 20/14-negative xray per daughter/pt but has shoulder pain, limited ROM, decreased strength    Subjective Data      Cognition       Balance     End of Session PT - End of Session Equipment Utilized During Treatment: Gait belt;Oxygen Activity Tolerance: Patient limited by fatigue Patient left: with call bell/phone within reach;with family/visitor present;in bed   [name]  PTA [facility]  Acute  Rehab Pager      [phone]

## 2013-10-05 NOTE — Discharge Summary (Signed)
Physician Discharge Summary  Andrea [name] KXF:[id number] DOB: [birth year] DOA: 10/01/2013  PCP: Alesia Richards, MD  Admit date: 10/01/2013 Discharge date: 10/05/2013  Time spent: > 35 minutes  Recommendations for Outpatient Follow-up:  1. Please be sure to follow up with your primary care physician in 1 week as indicated below 2. Monitor pulse ox at rest and while walking and attempt to wean off of supplemental oxygen 3. Held BP medications on discharge due to blood pressures initially being soft and normotensive values off of them. Recheck blood pressures in clinic and please consider restarting if appropriate. 4. May require pulmonary function testing once breathing back to baseline  Discharge Diagnoses:  Principal Problem:   Acute respiratory failure with hypoxia Active Problems:   HYPERTENSION   Mixed hyperlipidemia   Hypokalemia   Hyponatremia   CAP (community acquired pneumonia)   Hypotension, unspecified   Discharge Condition: Stable and breathing comfortably on supplemental oxygen  Diet recommendation: Low sodium heart healthy  Filed Weights   10/01/13 1539  Weight: 43.999 kg (97 lb)    History of present illness:  Patient is an 80 year old Caucasian female with reported history of emphysema who presented to the ED complaining of shortness of breath after spending the holiday seasons with friends and family and being reportedly around sick contacts.  Hospital Course:  Acute respiratory failure with hypoxia: 2ary to CAP  - Supportive therapy with supplemental oxygen. Will discharge home with supplemental oxygen and patient to followup with primary care physician for further recommendations regarding weaning off of supplemental oxygen - Urine Legionella/strep antigen negative, influenza negative  - Suspect patient will continue to improve on antibiotics and will discharge on azithromycin and third-generation cephalosporin for 3 more days to complete a seven-day  course. - Patient with reported history of emphysema as such will provide albuterol prescription on discharge to be used when necessary shortness of breath or wheeze. No wheezes on exam as such we'll not discharge on prednisone  Active Problems:  HYPERTENSION  -Initially antihypertensive medicines were held to 2 soft blood pressures. Throughout the course of her stay her blood pressures have been relatively well controlled off of the medications as such will discharge off of antihypertensive medication.  Mixed hyperlipidemia  -Stable, continue home regimen   Hypokalemia  -Resolved after replacement.   Hyponatremia  -Improved initially with normal saline IV fluid administration. Has completely resolved and we'll continue to maintain within normal limits with improved oral intake.  Hypothyroidism  -Stable continue home regimen.    Procedures:  None, please see below for imaging results  Consultations:  NOne  Discharge Exam: Filed Vitals:   10/05/13 0500  BP: 152/63  Pulse: 95  Temp: 97.6 F (36.4 C)  Resp: 18    General: Pt in NAD, Alert and awake Cardiovascular: RRR, no MRG Respiratory: prolonged expiratory phase, no wheezes, breathing comfortably with supplemental oxygen via nasal cannula in place.  Discharge Instructions  Discharge Orders   Future Appointments Provider Department Dept Phone   06/26/2014 2:00 PM Vicie Mutters, [place] [facility] [phone]   Future Orders Complete By Expires   Call MD for:  difficulty breathing, headache or visual disturbances  As directed    Call MD for:  temperature >100.4  As directed    Diet - low sodium heart healthy  As directed    Discharge instructions  As directed    Comments:     Patient to go home with home health physical therapy and 24  hour supervision which family informs me that the patient will have.  She is to followup with her primary care physician within the next week for  further evaluation and recommendations and oxygen weaning   Increase activity slowly  As directed        Medication List    STOP taking these medications       amLODipine 5 MG tablet  Commonly known as:  NORVASC     atenolol 100 MG tablet  Commonly known as:  TENORMIN     losartan 100 MG tablet  Commonly known as:  COZAAR     triamterene-hydrochlorothiazide 75-50 MG per tablet  Commonly known as:  MAXZIDE      TAKE these medications       ALPRAZolam 0.5 MG tablet  Commonly known as:  XANAX  Take 0.25 mg by mouth at bedtime as needed for anxiety or sleep.     ALPRAZolam 0.5 MG tablet  Commonly known as:  XANAX  Take 0.5 tablets (0.25 mg total) by mouth at bedtime as needed.     azithromycin 500 MG tablet  Commonly known as:  ZITHROMAX  Take 1 tablet (500 mg total) by mouth daily.     cefdinir 300 MG capsule  Commonly known as:  OMNICEF  Take 1 capsule (300 mg total) by mouth 2 (two) times daily.     cholecalciferol 1000 UNITS tablet  Commonly known as:  VITAMIN D  Take 1,000 Units by mouth 2 (two) times daily.     levothyroxine 50 MCG tablet  Commonly known as:  SYNTHROID, LEVOTHROID  Take 50 mcg by mouth as directed. Take 0.5 tablets every day except Sunday.     magnesium gluconate 500 MG tablet  Commonly known as:  MAGONATE  Take 500 mg by mouth 2 (two) times daily.     meloxicam 15 MG tablet  Commonly known as:  MOBIC  Take 7.5-15 mg by mouth daily.     omeprazole 40 MG capsule  Commonly known as:  PRILOSEC  Take 40 mg by mouth daily.     traMADol 50 MG tablet  Commonly known as:  ULTRAM  Take 50 mg by mouth every 4 (four) hours as needed for moderate pain or severe pain.     vitamin C 500 MG tablet  Commonly known as:  ASCORBIC ACID  Take 500 mg by mouth daily.       Allergies  Allergen Reactions  . Sulfa Antibiotics Other (See Comments)    Unknown      The results of significant diagnostics from this hospitalization (including imaging,  microbiology, ancillary and laboratory) are listed below for reference.    Significant Diagnostic Studies: Dg Chest 2 View  10/01/2013   CLINICAL DATA:  Cough for 4 days.  EXAM: CHEST  2 VIEW  COMPARISON:  CT chest from 07/03/2012.  Chest x-ray from 04/29/2010.  FINDINGS: Lungs are hyperexpanded, consistent with emphysema. There is airspace disease in the right base suggesting pneumonia. There may be an associated small right pleural effusion. Cardiopericardial silhouette is at upper limits of normal for size. Bones are diffusely demineralized with chronic rotator cuff disease in the right shoulder.  IMPRESSION: Emphysema with airspace disease in the right base suggesting pneumonia.   Electronically Signed   By: Misty Stanley M.D.   On: 10/01/2013 12:49   Dg Humerus Left  10/01/2013   CLINICAL DATA:  Fall.  EXAM: LEFT HUMERUS - 2+ VIEW  COMPARISON:  None.  FINDINGS: Diffuse  osteopenia.  No evidence of fracture or dislocation.  IMPRESSION: No acute abnormality.   Electronically Signed   By: Marcello Moores  Register   On: 10/01/2013 13:54    Microbiology: Recent Results (from the past 240 hour(s))  CULTURE, BLOOD (ROUTINE X 2)     Status: None   Collection Time    10/01/13  1:15 PM      Result Value Range Status   Specimen Description BLOOD LEFT ANTECUBITAL   Final   Special Requests BOTTLES DRAWN AEROBIC AND ANAEROBIC 5ML   Final   Culture  Setup Time     Final   Value: 10/01/2013 16:58     Performed at [facility]   Culture     Final   Value:        BLOOD CULTURE RECEIVED NO GROWTH TO DATE CULTURE WILL BE HELD FOR 5 DAYS BEFORE ISSUING A FINAL NEGATIVE REPORT     Performed at [facility]   Report Status PENDING   Incomplete  CULTURE, BLOOD (ROUTINE X 2)     Status: None   Collection Time    10/01/13  1:50 PM      Result Value Range Status   Specimen Description BLOOD RIGHT ANTECUBITAL   Final   Special Requests BOTTLES DRAWN AEROBIC AND ANAEROBIC 5 CC EA   Final   Culture   Setup Time     Final   Value: 10/01/2013 20:11     Performed at [facility]   Culture     Final   Value:        BLOOD CULTURE RECEIVED NO GROWTH TO DATE CULTURE WILL BE HELD FOR 5 DAYS BEFORE ISSUING A FINAL NEGATIVE REPORT     Performed at [facility]   Report Status PENDING   Incomplete  CULTURE, BLOOD (ROUTINE X 2)     Status: None   Collection Time    10/01/13  4:17 PM      Result Value Range Status   Specimen Description BLOOD LEFT ARM   Final   Special Requests BOTTLES DRAWN AEROBIC ONLY 5CC   Final   Culture  Setup Time     Final   Value: 10/01/2013 20:10     Performed at [facility]   Culture     Final   Value:        BLOOD CULTURE RECEIVED NO GROWTH TO DATE CULTURE WILL BE HELD FOR 5 DAYS BEFORE ISSUING A FINAL NEGATIVE REPORT     Performed at [facility]   Report Status PENDING   Incomplete  CULTURE, BLOOD (ROUTINE X 2)     Status: None   Collection Time    10/01/13  4:25 PM      Result Value Range Status   Specimen Description BLOOD RIGHT HAND   Final   Special Requests BOTTLES DRAWN AEROBIC ONLY 3CC   Final   Culture  Setup Time     Final   Value: 10/01/2013 20:12     Performed at [facility]   Culture     Final   Value:        BLOOD CULTURE RECEIVED NO GROWTH TO DATE CULTURE WILL BE HELD FOR 5 DAYS BEFORE ISSUING A FINAL NEGATIVE REPORT     Performed at [facility]   Report Status PENDING   Incomplete     Labs: Basic Metabolic Panel:  Recent Labs Lab 10/01/13 1208 10/01/13 1623 10/02/13 0532 10/03/13 4259  10/04/13 0550  NA 129* 128* 133* 135* 141  K 3.6* 3.5* 4.5 4.5 4.1  CL 88* 89* 98 101 105  CO2 _0 GLUCOSE 127* 193* 103* 98 98  BUN 40* 39* 35* 27* 17  CREATININE 1.43* 1.26* 1.22* 0.94 0.79  CALCIUM 9.7 8.9 8.5 8.5 8.8  MG  --  2.2  --   --   --   PHOS  --  2.8  --   --   --    Liver Function Tests:  Recent Labs Lab 10/01/13 1208  AST 34  ALT 19  ALKPHOS 109  BILITOT 0.4   PROT 7.7  ALBUMIN 3.1*   No results found for this basename: LIPASE, AMYLASE,  in the last 168 hours No results found for this basename: AMMONIA,  in the last 168 hours CBC:  Recent Labs Lab 10/01/13 1208 10/01/13 1623 10/02/13 0532 10/03/13 0506 10/04/13 0550  WBC 20.1* 15.6* 16.2* 14.3* 12.2*  NEUTROABS 16.7*  --   --   --   --   HGB 13.1 11.4* 11.1* 11.2* 11.0*  HCT 39.1 34.5* 34.4* 34.8* 34.9*  MCV 90.3 91.5 91.5 93.5 93.8  PLT 272 231 262 274 328   Cardiac Enzymes: No results found for this basename: CKTOTAL, CKMB, CKMBINDEX, TROPONINI,  in the last 168 hours BNP: BNP (last 3 results) No results found for this basename: PROBNP,  in the last 8760 hours CBG: No results found for this basename: GLUCAP,  in the last 168 hours     Signed:  Velvet Bathe  [facility] 10/05/2013, 1:41 PM

## 2013-10-05 NOTE — Progress Notes (Signed)
Patient discharged home with family. D/c instructions reviewed with pt and family. They verbalized understanding

## 2013-10-06 DIAGNOSIS — Z9981 Dependence on supplemental oxygen: Secondary | ICD-10-CM

## 2013-10-06 DIAGNOSIS — I10 Essential (primary) hypertension: Secondary | ICD-10-CM

## 2013-10-06 DIAGNOSIS — J189 Pneumonia, unspecified organism: Secondary | ICD-10-CM

## 2013-10-07 LAB — CULTURE, BLOOD (ROUTINE X 2)
CULTURE: NO GROWTH
CULTURE: NO GROWTH
Culture: NO GROWTH
Culture: NO GROWTH

## 2013-10-08 ENCOUNTER — Ambulatory Visit (INDEPENDENT_AMBULATORY_CARE_PROVIDER_SITE_OTHER): Payer: Medicare Other | Admitting: Physician Assistant

## 2013-10-08 ENCOUNTER — Encounter: Payer: Self-pay | Admitting: Physician Assistant

## 2013-10-08 VITALS — BP 138/78 | HR 88 | Temp 98.7°F | Resp 16 | Ht 63.0 in | Wt 97.0 lb

## 2013-10-08 DIAGNOSIS — J441 Chronic obstructive pulmonary disease with (acute) exacerbation: Secondary | ICD-10-CM

## 2013-10-08 DIAGNOSIS — I10 Essential (primary) hypertension: Secondary | ICD-10-CM

## 2013-10-08 LAB — CBC WITH DIFFERENTIAL/PLATELET
BASOS ABS: 0 10*3/uL (ref 0.0–0.1)
Basophils Relative: 0 % (ref 0–1)
Eosinophils Absolute: 0.2 10*3/uL (ref 0.0–0.7)
Eosinophils Relative: 1 % (ref 0–5)
HCT: 40.9 % (ref 36.0–46.0)
HEMOGLOBIN: 13.4 g/dL (ref 12.0–15.0)
LYMPHS PCT: 12 % (ref 12–46)
Lymphs Abs: 1.3 10*3/uL (ref 0.7–4.0)
MCH: 29.7 pg (ref 26.0–34.0)
MCHC: 32.8 g/dL (ref 30.0–36.0)
MCV: 90.7 fL (ref 78.0–100.0)
MONOS PCT: 6 % (ref 3–12)
Monocytes Absolute: 0.6 10*3/uL (ref 0.1–1.0)
NEUTROS ABS: 8.6 10*3/uL — AB (ref 1.7–7.7)
Neutrophils Relative %: 81 % — ABNORMAL HIGH (ref 43–77)
Platelets: 461 10*3/uL — ABNORMAL HIGH (ref 150–400)
RBC: 4.51 MIL/uL (ref 3.87–5.11)
RDW: 13.5 % (ref 11.5–15.5)
WBC: 10.8 10*3/uL — AB (ref 4.0–10.5)

## 2013-10-08 NOTE — Progress Notes (Signed)
HPI Patient presents for hospital follow up. She presented to the office on 10/01/2012 with hypoxia and was sent to the ER. She was in the hospital from 10/01/2012-10/05/2012 for acute respiratory failure with hypoxia. She was taken off all of her BP meds and put on supplemental oxygen. She had a home health nurse from [facility] on Saturday per her daughter and states she was doing well. She has finished her zpak and has one pill left of cefdinir tonight. She has had diarrhea, orange color every time she eats, no blood, not heavy volume, not very watery and does not have an appetite.. She continues to be weak, she has a walker but does not use it due to oxygen cords. She states that her breathing is much better and she would like to get off the oxygen. She has gained 1 lbs. She has been on 3 L [place] since the hospital.   Patient has COPD and is not on any home medications. She does have albuterol now. Currently without oxygen she is at 92 percent at rest.  Pt went to 88% with walking and 94% with oxygen replacement  Past Medical History  Diagnosis Date  . Palpitations   . HLD (hyperlipidemia)   . DJD (degenerative joint disease)   . Mild mitral regurgitation by prior echocardiogram   . Mild tricuspid regurgitation   . HTN (hypertension)      Allergies  Allergen Reactions  . Sulfa Antibiotics Other (See Comments)    Unknown      Current Outpatient Prescriptions on File Prior to Visit  Medication Sig Dispense Refill  . albuterol (PROVENTIL HFA;VENTOLIN HFA) 108 (90 BASE) MCG/ACT inhaler Inhale 2 puffs into the lungs every 6 (six) hours as needed for wheezing or shortness of breath.  1 Inhaler  2  . ALPRAZolam (XANAX) 0.5 MG tablet Take 0.5 tablets (0.25 mg total) by mouth at bedtime as needed.  30 tablet  0  . ALPRAZolam (XANAX) 0.5 MG tablet Take 0.25 mg by mouth at bedtime as needed for anxiety or sleep.      . Ascorbic Acid (VITAMIN C) 500 MG tablet Take 500 mg by mouth daily.        [name]  azithromycin (ZITHROMAX) 500 MG tablet Take 1 tablet (500 mg total) by mouth daily.  3 tablet  0  . cefdinir (OMNICEF) 300 MG capsule Take 1 capsule (300 mg total) by mouth 2 (two) times daily.  6 capsule  0  . cholecalciferol (VITAMIN D) 1000 UNITS tablet Take 1,000 Units by mouth 2 (two) times daily.      [name] levothyroxine (SYNTHROID, LEVOTHROID) 50 MCG tablet Take 50 mcg by mouth as directed. Take 0.5 tablets every day except Sunday.      . magnesium gluconate (MAGONATE) 500 MG tablet Take 500 mg by mouth 2 (two) times daily.      . meloxicam (MOBIC) 15 MG tablet Take 7.5-15 mg by mouth daily.      [name] omeprazole (PRILOSEC) 40 MG capsule Take 40 mg by mouth daily.      . traMADol (ULTRAM) 50 MG tablet Take 50 mg by mouth every 4 (four) hours as needed for moderate pain or severe pain.       No current facility-administered medications on file prior to visit.    ROS: all negative expect above.   Physical: Filed Weights   10/08/13 1502  Weight: 97 lb (43.999 kg)   Filed Vitals:   10/08/13 1502  BP: 138/78  Pulse:  88  Temp: 98.7 F (37.1 C)  Resp: 16   General Appearance: Cachetic appearing female, in no apparent distress. Eyes: PERRLA, EOMs. Sinuses: No Frontal/maxillary tenderness ENT/Mouth: Ext aud canals clear, normal light reflex with TMs without erythema, bulging. Post pharynx without erythema, swelling, exudate.  Respiratory: CTAB, decreased breath sounds without wheezing, rales.  Cardio: RRR, no murmurs, rubs or gallops.  Abdomen: Soft, with bowl sounds. Nontender, no guarding, rebound. Lymphatics: Non tender without lymphadenopathy.  Musculoskeletal: Full ROM all peripheral extremities. Skin: Warm, dry without rashes, lesions, ecchymosis.  Neuro: Cranial nerves intact, reflexes equal bilaterally. Normal muscle tone, no cerebellar symptoms. Sensation intact.  Pysch: Awake and oriented X 3, normal affect, Insight and Judgment appropriate.   Assessment and Plan: COPD with  acute respiratory failure- symbicort samples given, I think it would be very beneficial for the patient to follow up with Dr. Annamaria Boots, will send in an order for portable oxygen.  Hypertension with tachycardia- Atenolol 45m 1/2 for heart rate and BP, I did explain the risk of worsening breathing with a BB but with her tachycardia it may be beneficial.  Diarrhea- probiotic, bland diet.  Follow up in one month

## 2013-10-08 NOTE — Patient Instructions (Signed)
Add back on only Atenolol 1/2 tablet at night for BP and palpiations. If breathing gets worse or BP is too low than call the office.  Please follow up with Dr. Annamaria Boots. Please do the symbicort twice daily, wash your mouth afterwards.  The proair is an emergency medications for shortness of breath.   Chronic Obstructive Pulmonary Disease Chronic obstructive pulmonary disease (COPD) is a common lung condition in which airflow from the lungs is limited. COPD is a general term that can be used to describe many different lung problems that limit airflow, including both chronic bronchitis and emphysema. If you have COPD, your lung function will probably never return to normal, but there are measures you can take to improve lung function and make yourself feel better.  CAUSES   Smoking (common).   Exposure to secondhand smoke.   Genetic problems.  Chronic inflammatory lung diseases or recurrent infections. SYMPTOMS   Shortness of breath, especially with physical activity.   Deep, persistent (chronic) cough with a large amount of thick mucus.   Wheezing.   Rapid breaths (tachypnea).   Gray or bluish discoloration (cyanosis) of the skin, especially in fingers, toes, or lips.   Fatigue.   Weight loss.   Frequent infections or episodes when breathing symptoms become much worse (exacerbations).   Chest tightness. DIAGNOSIS  Your healthcare provider will take a medical history and perform a physical examination to make the initial diagnosis. Additional tests for COPD may include:   Lung (pulmonary) function tests.  Chest X-ray.  CT scan.  Blood tests. TREATMENT  Treatment available to help you feel better when you have COPD include:   Inhaler and nebulizer medicines. These help manage the symptoms of COPD and make your breathing more comfortable  Supplemental oxygen. Supplemental oxygen is only helpful if you have a low oxygen level in your blood.   Exercise and  physical activity. These are beneficial for nearly all people with COPD. Some people may also benefit from a pulmonary rehabilitation program. HOME CARE INSTRUCTIONS   Take all medicines (inhaled or pills) as directed by your health care provider.  Only take over-the-counter or prescription medicines for pain, fever, or discomfort as directed by your health care provider.   Avoid over-the-counter medicines or cough syrups that dry up your airway (such as antihistamines) and slow down the elimination of secretions unless instructed otherwise by your healthcare provider.   If you are a smoker, the most important thing that you can do is stop smoking. Continuing to smoke will cause further lung damage and breathing trouble. Ask your health care provider for help with quitting smoking. He or she can direct you to community resources or hospitals that provide support.  Avoid exposure to irritants such as smoke, chemicals, and fumes that aggravate your breathing.  Use oxygen therapy and pulmonary rehabilitation if directed by your health care provider. If you require home oxygen therapy, ask your healthcare provider whether you should purchase a pulse oximeter to measure your oxygen level at home.   Avoid contact with individuals who have a contagious illness.  Avoid extreme temperature and humidity changes.  Eat healthy foods. Eating smaller, more frequent meals and resting before meals may help you maintain your strength.  Stay active, but balance activity with periods of rest. Exercise and physical activity will help you maintain your ability to do things you want to do.  Preventing infection and hospitalization is very important when you have COPD. Make sure to receive all the vaccines  your health care provider recommends, especially the pneumococcal and influenza vaccines. Ask your healthcare provider whether you need a pneumonia vaccine.  Learn and use relaxation techniques to manage  stress.  Learn and use controlled breathing techniques as directed by your health care provider. Controlled breathing techniques include:   Pursed lip breathing. Start by breathing in (inhaling) through your nose for 1 second. Then, purse your lips as if you were going to whistle and breathe out (exhale) through the pursed lips for 2 seconds.   Diaphragmatic breathing. Start by putting one hand on your abdomen just above your waist. Inhale slowly through your nose. The hand on your abdomen should move out. Then purse your lips and exhale slowly. You should be able to feel the hand on your abdomen moving in as you exhale.   Learn and use controlled coughing to clear mucus from your lungs. Controlled coughing is a series of short, progressive coughs. The steps of controlled coughing are:  1. Lean your head slightly forward.  2. Breathe in deeply using diaphragmatic breathing.  3. Try to hold your breath for 3 seconds.  4. Keep your mouth slightly open while coughing twice.  5. Spit any mucus out into a tissue.  6. Rest and repeat the steps once or twice as needed. SEEK MEDICAL CARE IF:   You are coughing up more mucus than usual.   There is a change in the color or thickness of your mucus.   Your breathing is more labored than usual.   Your breathing is faster than usual.  SEEK IMMEDIATE MEDICAL CARE IF:   You have shortness of breath while you are resting.   You have shortness of breath that prevents you from:  Being able to talk.   Performing your usual physical activities.   You have chest pain lasting longer than 5 minutes.   Your skin color is more cyanotic than usual.  You measure low oxygen saturations for longer than 5 minutes with a pulse oximeter. MAKE SURE YOU:   Understand these instructions.  Will watch your condition.  Will get help right away if you are not doing well or get worse. Document Released: 06/23/2005 Document Revised: 07/04/2013  Document Reviewed: 05/10/2013 [facility] Patient Information 2014 [place], [place].  Diet for Diarrhea, Adult Frequent, runny stools (diarrhea) may be caused or worsened by food or drink. Diarrhea may be relieved by changing your diet. Since diarrhea can last up to 7 days, it is easy for you to lose too much fluid from the body and become dehydrated. Fluids that are lost need to be replaced. Along with a modified diet, make sure you drink enough fluids to keep your urine clear or pale yellow. DIET INSTRUCTIONS  Ensure adequate fluid intake (hydration): have 1 cup (8 oz) of fluid for each diarrhea episode. Avoid fluids that contain simple sugars or sports drinks, fruit juices, whole milk products, and sodas. Your urine should be clear or pale yellow if you are drinking enough fluids. Hydrate with an oral rehydration solution that you can purchase at pharmacies, retail stores, and online. You can prepare an oral rehydration solution at home by mixing the following ingredients together:    tsp table salt.   tsp baking soda.   tsp salt substitute containing potassium chloride.  1  tablespoons sugar.  1 L (34 oz) of water.  Certain foods and beverages may increase the speed at which food moves through the gastrointestinal (GI) tract. These foods and beverages should be  avoided and include:  Caffeinated and alcoholic beverages.  High-fiber foods, such as raw fruits and vegetables, nuts, seeds, and whole grain breads and cereals.  Foods and beverages sweetened with sugar alcohols, such as xylitol, sorbitol, and mannitol.  Some foods may be well tolerated and may help thicken stool including:  Starchy foods, such as rice, toast, pasta, low-sugar cereal, oatmeal, grits, baked potatoes, crackers, and bagels.   Bananas.   Applesauce.  Add probiotic-rich foods to help increase healthy bacteria in the GI tract, such as yogurt and fermented milk products. RECOMMENDED FOODS AND  BEVERAGES Starches Choose foods with less than 2 g of fiber per serving.  Recommended:  White, [place], and pita breads, plain rolls, buns, bagels. Plain muffins, matzo. Soda, saltine, or graham crackers. Pretzels, melba toast, zwieback. Cooked cereals made with water: cornmeal, farina, cream cereals. Dry cereals: refined corn, wheat, rice. Potatoes prepared any way without skins, refined macaroni, spaghetti, noodles, refined rice.  Avoid:  Bread, rolls, or crackers made with whole wheat, multi-grains, rye, bran seeds, nuts, or coconut. Corn tortillas or taco shells. Cereals containing whole grains, multi-grains, bran, coconut, nuts, raisins. Cooked or dry oatmeal. Coarse wheat cereals, granola. Cereals advertised as "high-fiber." Potato skins. Whole grain pasta, wild or brown rice. Popcorn. Sweet potatoes, yams. Sweet rolls, doughnuts, waffles, pancakes, sweet breads. Vegetables  Recommended: Strained tomato and vegetable juices. Most well-cooked and canned vegetables without seeds. Fresh: Tender lettuce, cucumber without the skin, cabbage, spinach, bean sprouts.  Avoid: Fresh, cooked, or canned: Artichokes, baked beans, beet greens, broccoli, Brussels sprouts, corn, kale, legumes, peas, sweet potatoes. Cooked: Green or red cabbage, spinach. Avoid large servings of any vegetables because vegetables shrink when cooked, and they contain more fiber per serving than fresh vegetables. Fruit  Recommended: Cooked or canned: Apricots, applesauce, cantaloupe, cherries, fruit cocktail, grapefruit, grapes, kiwi, mandarin oranges, peaches, pears, plums, watermelon. Fresh: Apples without skin, ripe banana, grapes, cantaloupe, cherries, grapefruit, peaches, oranges, plums. Keep servings limited to  cup or 1 piece.  Avoid: Fresh: Apples with skin, apricots, mangoes, pears, raspberries, strawberries. Prune juice, stewed or dried prunes. Dried fruits, raisins, dates. Large servings of all fresh  fruits. Protein  Recommended: Ground or well-cooked tender beef, ham, veal, lamb, pork, or poultry. Eggs. Fish, oysters, shrimp, lobster, other seafoods. Liver, organ meats.  Avoid: Tough, fibrous meats with gristle. Peanut butter, smooth or chunky. Cheese, nuts, seeds, legumes, dried peas, beans, lentils. Dairy  Recommended: Yogurt, lactose-free milk, kefir, drinkable yogurt, buttermilk, soy milk, or plain hard cheese.  Avoid: Milk, chocolate milk, beverages made with milk, such as milkshakes. Soups  Recommended: Bouillon, broth, or soups made from allowed foods. Any strained soup.  Avoid: Soups made from vegetables that are not allowed, cream or milk-based soups. Desserts and Sweets  Recommended: Sugar-free gelatin, sugar-free frozen ice pops made without sugar alcohol.  Avoid: Plain cakes and cookies, pie made with fruit, pudding, custard, cream pie. Gelatin, fruit, ice, sherbet, frozen ice pops. Ice cream, ice milk without nuts. Plain hard candy, honey, jelly, molasses, syrup, sugar, chocolate syrup, gumdrops, marshmallows. Fats and Oils  Recommended: Limit fats to less than 8 tsp per day.  Avoid: Seeds, nuts, olives, avocados. Margarine, butter, cream, mayonnaise, salad oils, plain salad dressings. Plain gravy, crisp bacon without rind. Beverages  Recommended: Water, decaffeinated teas, oral rehydration solutions, sugar-free beverages not sweetened with sugar alcohols.  Avoid: Fruit juices, caffeinated beverages (coffee, tea, soda), alcohol, sports drinks, or lemon-lime soda. Condiments  Recommended: Ketchup, mustard, horseradish, vinegar, cocoa powder.  Spices in moderation: allspice, basil, bay leaves, celery powder or leaves, cinnamon, cumin powder, curry powder, ginger, mace, marjoram, onion or garlic powder, oregano, paprika, parsley flakes, ground pepper, rosemary, sage, savory, tarragon, thyme, turmeric.  Avoid: Coconut, honey. Document Released: 12/04/2003 Document  Revised: 06/07/2012 Document Reviewed: 01/28/2012 [facility] Patient Information 2014 [place].

## 2013-10-09 ENCOUNTER — Other Ambulatory Visit: Payer: Self-pay | Admitting: Physician Assistant

## 2013-10-09 ENCOUNTER — Other Ambulatory Visit: Payer: Self-pay

## 2013-10-09 LAB — HEPATIC FUNCTION PANEL
ALK PHOS: 93 U/L (ref 39–117)
ALT: 23 U/L (ref 0–35)
AST: 36 U/L (ref 0–37)
Albumin: 3.6 g/dL (ref 3.5–5.2)
BILIRUBIN DIRECT: 0.1 mg/dL (ref 0.0–0.3)
BILIRUBIN INDIRECT: 0.3 mg/dL (ref 0.0–0.9)
TOTAL PROTEIN: 7.3 g/dL (ref 6.0–8.3)
Total Bilirubin: 0.4 mg/dL (ref 0.3–1.2)

## 2013-10-09 LAB — BASIC METABOLIC PANEL WITH GFR
BUN: 11 mg/dL (ref 6–23)
CHLORIDE: 88 meq/L — AB (ref 96–112)
CO2: 38 mEq/L — ABNORMAL HIGH (ref 19–32)
Calcium: 10.2 mg/dL (ref 8.4–10.5)
Creat: 0.74 mg/dL (ref 0.50–1.10)
GFR, EST NON AFRICAN AMERICAN: 76 mL/min
GFR, Est African American: 87 mL/min
GLUCOSE: 101 mg/dL — AB (ref 70–99)
POTASSIUM: 5.2 meq/L (ref 3.5–5.3)
SODIUM: 137 meq/L (ref 135–145)

## 2013-10-09 LAB — MAGNESIUM: Magnesium: 1.5 mg/dL (ref 1.5–2.5)

## 2013-10-09 MED ORDER — AZITHROMYCIN 250 MG PO TABS: ORAL_TABLET | ORAL | Status: DC

## 2013-10-09 MED ORDER — MAGIC MOUTHWASH: 5.0000 mL | Freq: Three times a day (TID) | ORAL | Status: DC

## 2013-10-09 NOTE — Addendum Note (Signed)
Addended by: Vicie Mutters R on: 10/09/2013 08:22 AM   Modules accepted: Orders

## 2013-10-19 ENCOUNTER — Telehealth: Payer: Self-pay | Admitting: Physician Assistant

## 2013-10-19 NOTE — Telephone Encounter (Signed)
Patient was recently in the hospital with hypotension and she was taken off all of her medications. At her most recent visit she was started back on 1/2 pill of Atenolol. She called today states that the home health nurse took her BP and it was 170/80. She denies CP, dizziness, changes in speech/vision, and has her constant SOB which is unchanged. Her HR has been 80- 76.  We will get her to add back on a half Losartan 100 daily in the morning and take her BP daily. She will call back Monday. She will go to the ER if she has any palpitations, CP, SOB, confusion, changes in speech/vision.

## 2013-10-26 ENCOUNTER — Telehealth: Payer: Self-pay

## 2013-10-26 NOTE — Telephone Encounter (Signed)
Pt called and states BP is 188/93 and is asking if she should increase her meds. Please advise.

## 2013-10-30 ENCOUNTER — Other Ambulatory Visit: Payer: Self-pay | Admitting: Internal Medicine

## 2013-10-30 ENCOUNTER — Telehealth: Payer: Self-pay | Admitting: *Deleted

## 2013-10-30 MED ORDER — OMEPRAZOLE 40 MG PO CPDR: 40.0000 mg | DELAYED_RELEASE_CAPSULE | Freq: Every day | ORAL | Status: DC

## 2013-10-30 MED ORDER — LEVOTHYROXINE SODIUM 50 MCG PO TABS: 50.0000 ug | ORAL_TABLET | ORAL | Status: DC

## 2013-10-30 NOTE — Telephone Encounter (Signed)
Returned call to patient at 12:45 pm.  Per Irving Shows,  Patient should take another 1/2 tab of losartan 100 mg and can take a whole tab in AM.  If BP is  above 150/80 and pulse is greater than 70, patient can take a whole Atenolol 50 mg this PM.  If pulse lower than 70, take 1/2 tab.  Patient aware and appointment scheduled for 11/01/2013.

## 2013-10-30 NOTE — Telephone Encounter (Signed)
Patient called with BP concerns.  States BP is 193/95 this AM and was 166/93 last PM.  Patient took Atenolol 50 mg 1/2 tab last PM, but no AM med.  Patient will take Losartan 100 mg 1/2 tab and wait for me to return call.

## 2013-11-01 ENCOUNTER — Ambulatory Visit (INDEPENDENT_AMBULATORY_CARE_PROVIDER_SITE_OTHER): Payer: Medicare Other | Admitting: Physician Assistant

## 2013-11-01 ENCOUNTER — Encounter: Payer: Self-pay | Admitting: Physician Assistant

## 2013-11-01 VITALS — BP 132/70 | HR 84 | Temp 98.2°F | Resp 16 | Wt 92.0 lb

## 2013-11-01 DIAGNOSIS — I509 Heart failure, unspecified: Secondary | ICD-10-CM

## 2013-11-01 DIAGNOSIS — I10 Essential (primary) hypertension: Secondary | ICD-10-CM

## 2013-11-01 DIAGNOSIS — J449 Chronic obstructive pulmonary disease, unspecified: Secondary | ICD-10-CM

## 2013-11-01 DIAGNOSIS — E46 Unspecified protein-calorie malnutrition: Secondary | ICD-10-CM

## 2013-11-01 LAB — CBC WITH DIFFERENTIAL/PLATELET
Basophils Absolute: 0 10*3/uL (ref 0.0–0.1)
Basophils Relative: 0 % (ref 0–1)
EOS ABS: 0.1 10*3/uL (ref 0.0–0.7)
EOS PCT: 1 % (ref 0–5)
HEMATOCRIT: 37.4 % (ref 36.0–46.0)
Hemoglobin: 12.4 g/dL (ref 12.0–15.0)
LYMPHS ABS: 1.3 10*3/uL (ref 0.7–4.0)
LYMPHS PCT: 23 % (ref 12–46)
MCH: 29.9 pg (ref 26.0–34.0)
MCHC: 33.2 g/dL (ref 30.0–36.0)
MCV: 90.1 fL (ref 78.0–100.0)
MONO ABS: 0.5 10*3/uL (ref 0.1–1.0)
Monocytes Relative: 9 % (ref 3–12)
Neutro Abs: 3.8 10*3/uL (ref 1.7–7.7)
Neutrophils Relative %: 67 % (ref 43–77)
Platelets: 227 10*3/uL (ref 150–400)
RBC: 4.15 MIL/uL (ref 3.87–5.11)
RDW: 14.1 % (ref 11.5–15.5)
WBC: 5.6 10*3/uL (ref 4.0–10.5)

## 2013-11-01 MED ORDER — LOSARTAN POTASSIUM 100 MG PO TABS: ORAL_TABLET | ORAL | Status: DC

## 2013-11-01 MED ORDER — ALPRAZOLAM 0.5 MG PO TABS: 0.2500 mg | ORAL_TABLET | Freq: Every evening | ORAL | Status: DC | PRN

## 2013-11-01 MED ORDER — AMLODIPINE BESYLATE 5 MG PO TABS
ORAL_TABLET | ORAL | Status: DC
Start: 2013-11-01 — End: 2014-12-09

## 2013-11-01 NOTE — Progress Notes (Signed)
HPI Patient presents for a one month follow up. She presented to the office on 10/01/2012 with hypoxia and was sent to the ER. She was in the hospital from 10/01/2012-10/05/2012 for acute respiratory failure with hypoxia. She states her breathing is doing well, she is no longer on oxygen during the day while sitting or walking and checks her oxygen and it is always above 90. She continues her oxygen at night 2 L. She was taken off all of her BP medications while in the hospital and we restarted her Atenolol 26m 1/2 pill nightly. She states since then her BP has been highest 193/90 and the lowest she has seen it was 150/70. Lowest heart rate is in the 70's. She called the office and she was told to start 1/2 of losartan and 1/2 of HCTZ QOD with the half of atenolol. She has since increased her Losartan to 1/2 BID.   BP Readings from Last 3 Encounters:  11/01/13 132/70  10/08/13 138/78  10/05/13 152/63   Wt Readings from Last 3 Encounters:  11/01/13 92 lb (41.731 kg)  10/08/13 97 lb (43.999 kg)  10/01/13 97 lb (43.999 kg)    Past Medical History  Diagnosis Date  . Palpitations   . HLD (hyperlipidemia)   . DJD (degenerative joint disease)   . Mild mitral regurgitation by prior echocardiogram   . Mild tricuspid regurgitation   . HTN (hypertension)      Allergies  Allergen Reactions  . Sulfa Antibiotics Other (See Comments)    Unknown    Current Outpatient Prescriptions on File Prior to Visit  Medication Sig Dispense Refill  . ALPRAZolam (XANAX) 0.5 MG tablet Take 0.5 tablets (0.25 mg total) by mouth at bedtime as needed.  30 tablet  0  . Ascorbic Acid (VITAMIN C) 500 MG tablet Take 500 mg by mouth daily.        . cholecalciferol (VITAMIN D) 1000 UNITS tablet Take 1,000 Units by mouth 2 (two) times daily.      .Marland Kitchenlevothyroxine (SYNTHROID, LEVOTHROID) 50 MCG tablet Take 1 tablet (50 mcg total) by mouth as directed. Take 0.5 tablets every day except Sunday.  90 tablet  1  . magnesium  gluconate (MAGONATE) 500 MG tablet Take 500 mg by mouth 2 (two) times daily.      . meloxicam (MOBIC) 15 MG tablet Take 7.5-15 mg by mouth daily.      .Marland Kitchenomeprazole (PRILOSEC) 40 MG capsule Take 1 capsule (40 mg total) by mouth daily.  90 capsule  1   No current facility-administered medications on file prior to visit.    ROS: all negative expect above.   Physical: Filed Weights   11/01/13 1346  Weight: 92 lb (41.731 kg)   Filed Vitals:   11/01/13 1346  BP: 132/70  Pulse: 84  Temp: 98.2 F (36.8 C)  Resp: 16   General Appearance: Malnourished, in no apparent distress. Eyes: PERRLA, EOMs. Sinuses: No Frontal/maxillary tenderness ENT/Mouth: Ext aud canals clear, normal light reflex with TMs without erythema, bulging. Post pharynx without erythema, swelling, exudate.  Respiratory: CTAB, decreased breath sounds Cardio: RRR, no murmurs, rubs or gallops. Peripheral pulses brisk and equal bilaterally, without edema. No aortic or femoral bruits. Abdomen: Soft, with bowl sounds. Nontender, no guarding, rebound. Lymphatics: Non tender without lymphadenopathy.  Musculoskeletal: Full ROM all peripheral extremities, 4/5 strength, and normal gait. Skin: Warm, dry without rashes, lesions, ecchymosis.  Neuro: Cranial nerves intact, reflexes equal bilaterally. Normal muscle tone, no cerebellar symptoms.  Pysch: Awake and oriented X 3, normal affect, Insight and Judgment appropriate.   Assessment and Plan: Hypertension- continue to monitor at home- suggest staying on the atenolol 64m 1/2 pill at night, Losartan 1057m1/2 in the AM, 1/2 in the PM, and HCTZ 2571m/2 pill every other day, can increase to every day if have more than 3-5lbs weight gain in one day. If her BP goes above 150/90 then take 1/2 of the norvasc (amlopdipine) 5mg71mCHF- decrease salt and weight daily COPD- monitor breathing, follow up Dr. YounAnnamaria Bootsecheck BMP for C02, check CBC Malnutrition- BMI 16- suggest ensure/boost- will  check LFTs

## 2013-11-01 NOTE — Patient Instructions (Signed)
Continue: atenolol 10m 1/2 pill at night Losartan 1068m1/2 in the AM, 1/2 in the PM HCTZ 2540m/2 pill every other day  If your BP goes above 150/90 then take 1/2 of the norvasc (amlopdipine) 5mg39m Hypertension Hypertension is another name for high blood pressure. High blood pressure may mean that your heart needs to work harder to pump blood. Blood pressure consists of two numbers, which includes a higher number over a lower number (example: 110/72). HOME CARE   Make lifestyle changes as told by your doctor. This may include weight loss and exercise.  Take your blood pressure medicine every day.  Limit how much salt you use.  Stop smoking if you smoke.  Do not use drugs.  Talk to your doctor if you are using decongestants or birth control pills. These medicines might make blood pressure higher.  Females should not drink more than 1 alcoholic drink per day. Males should not drink more than 2 alcoholic drinks per day.  See your doctor as told. GET HELP RIGHT AWAY IF:   You have a blood pressure reading with a top number of 180 or higher.  You get a very bad headache.  You get blurred or changing vision.  You feel confused.  You feel weak, numb, or faint.  You get chest or belly (abdominal) pain.  You throw up (vomit).  You cannot breathe very well. MAKE SURE YOU:   Understand these instructions.  Will watch your condition.  Will get help right away if you are not doing well or get worse. Document Released: 03/01/2008 Document Revised: 12/06/2011 Document Reviewed: 03/01/2008 [facility] Information 2014 [email or web address]

## 2013-11-02 LAB — BASIC METABOLIC PANEL WITH GFR
BUN: 18 mg/dL (ref 6–23)
CALCIUM: 10.3 mg/dL (ref 8.4–10.5)
CHLORIDE: 88 meq/L — AB (ref 96–112)
CO2: 31 meq/L (ref 19–32)
CREATININE: 0.89 mg/dL (ref 0.50–1.10)
GFR, Est African American: 70 mL/min
GFR, Est Non African American: 61 mL/min
GLUCOSE: 107 mg/dL — AB (ref 70–99)
Potassium: 4.5 mEq/L (ref 3.5–5.3)
Sodium: 131 mEq/L — ABNORMAL LOW (ref 135–145)

## 2013-11-02 LAB — TSH: TSH: 2.838 u[IU]/mL (ref 0.350–4.500)

## 2013-11-02 LAB — HEPATIC FUNCTION PANEL
ALBUMIN: 4.4 g/dL (ref 3.5–5.2)
ALK PHOS: 70 U/L (ref 39–117)
ALT: 8 U/L (ref 0–35)
AST: 19 U/L (ref 0–37)
Bilirubin, Direct: 0.1 mg/dL (ref 0.0–0.3)
Indirect Bilirubin: 0.5 mg/dL (ref 0.2–1.2)
Total Bilirubin: 0.6 mg/dL (ref 0.2–1.2)
Total Protein: 7.4 g/dL (ref 6.0–8.3)

## 2013-11-05 ENCOUNTER — Other Ambulatory Visit: Payer: Self-pay

## 2013-11-05 MED ORDER — AZITHROMYCIN 250 MG PO TABS: ORAL_TABLET | ORAL | Status: AC

## 2013-11-06 ENCOUNTER — Institutional Professional Consult (permissible substitution): Payer: Medicare Other | Admitting: Internal Medicine

## 2013-11-08 ENCOUNTER — Ambulatory Visit: Payer: Self-pay | Admitting: Physician Assistant

## 2013-11-16 ENCOUNTER — Telehealth: Payer: Self-pay

## 2013-11-16 NOTE — Telephone Encounter (Signed)
Received paper note from front office staff, patient states that her blood pressure has been elevated, 179/90, Vicie Mutters, PA had previously advised her to take 1/2 of her Norvasc 5 mg for elevated pressure and was advised at this time per Vicie Mutters, PA to take the whole 5 mg Norvasc if Blood pressure did not improve then she would need to go the the ER, patient verbalized understanding

## 2013-11-19 ENCOUNTER — Encounter: Payer: Self-pay | Admitting: Physician Assistant

## 2013-11-19 ENCOUNTER — Ambulatory Visit (INDEPENDENT_AMBULATORY_CARE_PROVIDER_SITE_OTHER): Payer: Medicare Other | Admitting: Physician Assistant

## 2013-11-19 VITALS — BP 158/72 | HR 76 | Temp 98.4°F | Resp 16 | Wt 96.0 lb

## 2013-11-19 DIAGNOSIS — J449 Chronic obstructive pulmonary disease, unspecified: Secondary | ICD-10-CM

## 2013-11-19 DIAGNOSIS — E876 Hypokalemia: Secondary | ICD-10-CM

## 2013-11-19 DIAGNOSIS — E782 Mixed hyperlipidemia: Secondary | ICD-10-CM

## 2013-11-19 DIAGNOSIS — I10 Essential (primary) hypertension: Secondary | ICD-10-CM

## 2013-11-19 DIAGNOSIS — R7309 Other abnormal glucose: Secondary | ICD-10-CM

## 2013-11-19 DIAGNOSIS — E871 Hypo-osmolality and hyponatremia: Secondary | ICD-10-CM

## 2013-11-19 LAB — HEMOGLOBIN A1C
HEMOGLOBIN A1C: 5.6 % (ref <5.7)
Mean Plasma Glucose: 114 mg/dL (ref <117)

## 2013-11-19 LAB — BASIC METABOLIC PANEL WITH GFR
BUN: 19 mg/dL (ref 6–23)
CO2: 32 meq/L (ref 19–32)
Calcium: 10 mg/dL (ref 8.4–10.5)
Chloride: 95 mEq/L — ABNORMAL LOW (ref 96–112)
Creat: 0.85 mg/dL (ref 0.50–1.10)
GFR, Est African American: 74 mL/min
GFR, Est Non African American: 64 mL/min
Glucose, Bld: 77 mg/dL (ref 70–99)
Potassium: 4.4 mEq/L (ref 3.5–5.3)
SODIUM: 135 meq/L (ref 135–145)

## 2013-11-19 LAB — HEPATIC FUNCTION PANEL
ALK PHOS: 55 U/L (ref 39–117)
ALT: 11 U/L (ref 0–35)
AST: 22 U/L (ref 0–37)
Albumin: 4.4 g/dL (ref 3.5–5.2)
BILIRUBIN DIRECT: 0.1 mg/dL (ref 0.0–0.3)
BILIRUBIN TOTAL: 0.6 mg/dL (ref 0.2–1.2)
Indirect Bilirubin: 0.5 mg/dL (ref 0.2–1.2)
Total Protein: 7.1 g/dL (ref 6.0–8.3)

## 2013-11-19 LAB — LIPID PANEL
CHOL/HDL RATIO: 2.3 ratio
Cholesterol: 232 mg/dL — ABNORMAL HIGH (ref 0–200)
HDL: 99 mg/dL (ref >39)
LDL Cholesterol: 119 mg/dL — ABNORMAL HIGH (ref 0–99)
Triglycerides: 68 mg/dL (ref <150)
VLDL: 14 mg/dL (ref 0–40)

## 2013-11-19 LAB — CBC WITH DIFFERENTIAL/PLATELET
Basophils Absolute: 0.1 10*3/uL (ref 0.0–0.1)
Basophils Relative: 1 % (ref 0–1)
EOS PCT: 1 % (ref 0–5)
Eosinophils Absolute: 0.1 10*3/uL (ref 0.0–0.7)
HCT: 35.1 % — ABNORMAL LOW (ref 36.0–46.0)
Hemoglobin: 11.8 g/dL — ABNORMAL LOW (ref 12.0–15.0)
LYMPHS ABS: 1.5 10*3/uL (ref 0.7–4.0)
Lymphocytes Relative: 29 % (ref 12–46)
MCH: 30.2 pg (ref 26.0–34.0)
MCHC: 33.6 g/dL (ref 30.0–36.0)
MCV: 89.8 fL (ref 78.0–100.0)
Monocytes Absolute: 0.5 10*3/uL (ref 0.1–1.0)
Monocytes Relative: 9 % (ref 3–12)
NEUTROS PCT: 60 % (ref 43–77)
Neutro Abs: 3 10*3/uL (ref 1.7–7.7)
PLATELETS: 217 10*3/uL (ref 150–400)
RBC: 3.91 MIL/uL (ref 3.87–5.11)
RDW: 14.4 % (ref 11.5–15.5)
WBC: 5 10*3/uL (ref 4.0–10.5)

## 2013-11-19 LAB — MAGNESIUM: Magnesium: 1.7 mg/dL (ref 1.5–2.5)

## 2013-11-19 LAB — TSH: TSH: 2.341 u[IU]/mL (ref 0.350–4.500)

## 2013-11-19 NOTE — Patient Instructions (Addendum)
Please continue the Losartan 100, 1/2 in the AM, 1/2 in the PM Start Atenolol 111m, 1/2 in the AM, 1/2 in the PM- this can help the tremor, help your blood pressure, and decrease your heart rate. If you get more short of breath on it call me.  Continue the Triam/HCTZ 75/50 1/2 pill every other day BUT if you have swelling you can take 3-5 days in a row, increase foods high in potassium. STOP if dizzy.  Can do 1/2 of Norvasc if BP is greater than 150/90 and if it continues to be elevated take 1/2 pill daily.   Preventative Care for Adults - Female      MAINTAIN REGULAR HEALTH EXAMS:  A routine yearly physical is a good way to check in with your primary care provider about your health and preventive screening. It is also an opportunity to share updates about your health and any concerns you have, and receive a thorough all-over exam.   Most health insurance companies pay for at least some preventative services.  Check with your health plan for specific coverages.  WHAT PREVENTATIVE SERVICES DO WOMEN NEED?  Adult women should have their weight and blood pressure checked regularly.   Women age 6712and older should have their cholesterol levels checked regularly.  Women should be screened for cervical cancer with a Pap smear and pelvic exam beginning at either age 78 or 3 years after they become sexually activity.    Breast cancer screening generally begins at age 50550with a mammogram and breast exam by your primary care provider.    Beginning at age 5014and continuing to age 78 women should be screened for colorectal cancer.  Certain people may need continued testing until age 78  Updating vaccinations is part of preventative care.  Vaccinations help protect against diseases such as the flu.  Osteoporosis is a disease in which the bones lose minerals and strength as we age. Women ages 637and over should discuss this with their caregivers, as should women after menopause who have other risk  factors.  Lab tests are generally done as part of preventative care to screen for anemia and blood disorders, to screen for problems with the kidneys and liver, to screen for bladder problems, to check blood sugar, and to check your cholesterol level.  Preventative services generally include counseling about diet, exercise, avoiding tobacco, drugs, excessive alcohol consumption, and sexually transmitted infections.    GENERAL RECOMMENDATIONS FOR GOOD HEALTH:  Healthy diet:  Eat a variety of foods, including fruit, vegetables, animal or vegetable protein, such as meat, fish, chicken, and eggs, or beans, lentils, tofu, and grains, such as rice.  Drink plenty of water daily.  Decrease saturated fat in the diet, avoid lots of red meat, processed foods, sweets, fast foods, and fried foods.  Exercise:  Aerobic exercise helps maintain good heart health. At least 30-40 minutes of moderate-intensity exercise is recommended. For example, a brisk walk that increases your heart rate and breathing. This should be done on most days of the week.   Find a type of exercise or a variety of exercises that you enjoy so that it becomes a part of your daily life.  Examples are running, walking, swimming, water aerobics, and biking.  For motivation and support, explore group exercise such as aerobic class, spin class, Zumba, Yoga,or  martial arts, etc.    Set exercise goals for yourself, such as a certain weight goal, walk or run in a race such as  a 5k walk/run.  Speak to your primary care provider about exercise goals.  Disease prevention:  If you smoke or chew tobacco, find out from your caregiver how to quit. It can literally save your life, no matter how long you have been a tobacco user. If you do not use tobacco, never begin.   Maintain a healthy diet and normal weight. Increased weight leads to problems with blood pressure and diabetes.   The Body Mass Index or BMI is a way of measuring how much of  your body is fat. Having a BMI above 27 increases the risk of heart disease, diabetes, hypertension, stroke and other problems related to obesity. Your caregiver can help determine your BMI and based on it develop an exercise and dietary program to help you achieve or maintain this important measurement at a healthful level.  High blood pressure causes heart and blood vessel problems.  Persistent high blood pressure should be treated with medicine if weight loss and exercise do not work.   Fat and cholesterol leaves deposits in your arteries that can block them. This causes heart disease and vessel disease elsewhere in your body.  If your cholesterol is found to be high, or if you have heart disease or certain other medical conditions, then you may need to have your cholesterol monitored frequently and be treated with medication.   Ask if you should have a cardiac stress test if your history suggests this. A stress test is a test done on a treadmill that looks for heart disease. This test can find disease prior to there being a problem.  Menopause can be associated with physical symptoms and risks. Hormone replacement therapy is available to decrease these. You should talk to your caregiver about whether starting or continuing to take hormones is right for you.   Osteoporosis is a disease in which the bones lose minerals and strength as we age. This can result in serious bone fractures. Risk of osteoporosis can be identified using a bone density scan. Women ages 21 and over should discuss this with their caregivers, as should women after menopause who have other risk factors. Ask your caregiver whether you should be taking a calcium supplement and Vitamin D, to reduce the rate of osteoporosis.   Avoid drinking alcohol in excess (more than two drinks per day).  Avoid use of street drugs. Do not share needles with anyone. Ask for professional help if you need assistance or instructions on stopping the use  of alcohol, cigarettes, and/or drugs.  Brush your teeth twice a day with fluoride toothpaste, and floss once a day. Good oral hygiene prevents tooth decay and gum disease. The problems can be painful, unattractive, and can cause other health problems. Visit your dentist for a routine oral and dental check up and preventive care every 6-12 months.   Look at your skin regularly.  Use a mirror to look at your back. Notify your caregivers of changes in moles, especially if there are changes in shapes, colors, a size larger than a pencil eraser, an irregular border, or development of new moles.  Safety:  Use seatbelts 100% of the time, whether driving or as a passenger.  Use safety devices such as hearing protection if you work in environments with loud noise or significant background noise.  Use safety glasses when doing any work that could send debris in to the eyes.  Use a helmet if you ride a bike or motorcycle.  Use appropriate safety gear for contact  sports.  Talk to your caregiver about gun safety.  Use sunscreen with a SPF (or skin protection factor) of 15 or greater.  Lighter skinned people are at a greater risk of skin cancer. Don't forget to also wear sunglasses in order to protect your eyes from too much damaging sunlight. Damaging sunlight can accelerate cataract formation.   Practice safe sex. Use condoms. Condoms are used for birth control and to help reduce the spread of sexually transmitted infections (or STIs).  Some of the STIs are gonorrhea (the clap), chlamydia, syphilis, trichomonas, herpes, HPV (human papilloma virus) and HIV (human immunodeficiency virus) which causes AIDS. The herpes, HIV and HPV are viral illnesses that have no cure. These can result in disability, cancer and death.   Keep carbon monoxide and smoke detectors in your home functioning at all times. Change the batteries every 6 months or use a model that plugs into the wall.   Vaccinations:  Stay up to date with  your tetanus shots and other required immunizations. You should have a booster for tetanus every 10 years. Be sure to get your flu shot every year, since 5%-20% of the [place] population comes down with the flu. The flu vaccine changes each year, so being vaccinated once is not enough. Get your shot in the fall, before the flu season peaks.   Other vaccines to consider:  Human Papilloma Virus or HPV causes cancer of the cervix, and other infections that can be transmitted from person to person. There is a vaccine for HPV, and females should get immunized between the ages of 55 and 64. It requires a series of 3 shots.   Pneumococcal vaccine to protect against certain types of pneumonia.  This is normally recommended for adults age 63 or older.  However, adults younger than 78 years old with certain underlying conditions such as diabetes, heart or lung disease should also receive the vaccine.  Shingles vaccine to protect against Varicella Zoster if you are older than age 103, or younger than 78 years old with certain underlying illness.  Hepatitis A vaccine to protect against a form of infection of the liver by a virus acquired from food.  Hepatitis B vaccine to protect against a form of infection of the liver by a virus acquired from blood or body fluids, particularly if you work in health care.  If you plan to travel internationally, check with your local health department for specific vaccination recommendations.  Cancer Screening:  Breast cancer screening is essential to preventive care for women. All women age 14 and older should perform a breast self-exam every month. At age 30 and older, women should have their caregiver complete a breast exam each year. Women at ages 63 and older should have a mammogram (x-ray film) of the breasts. Your caregiver can discuss how often you need mammograms.    Cervical cancer screening includes taking a Pap smear (sample of cells examined under a microscope) from  the cervix (end of the uterus). It also includes testing for HPV (Human Papilloma Virus, which can cause cervical cancer). Screening and a pelvic exam should begin at age 75, or 3 years after a woman becomes sexually active. Screening should occur every year, with a Pap smear but no HPV testing, up to age 82. After age 61, you should have a Pap smear every 3 years with HPV testing, if no HPV was found previously.   Most routine colon cancer screening begins at the age of 78. On a yearly  basis, doctors may provide special easy to use take-home tests to check for hidden blood in the stool. Sigmoidoscopy or colonoscopy can detect the earliest forms of colon cancer and is life saving. These tests use a small camera at the end of a tube to directly examine the colon. Speak to your caregiver about this at age 34, when routine screening begins (and is repeated every 5 years unless early forms of pre-cancerous polyps or small growths are found).    Advance Directive Advance directives are the legal documents that allow you to make choices about your health care and medical treatment if you cannot speak for yourself. Advance directives are a way for you to communicate your wishes to family, friends, and health care providers. The specified people can then convey your decisions about end-of-life care to avoid confusion if you should become unable to communicate. Ideally, the process of discussing and writing advance directives should be discussed over time rather than making decisions all at once. Advance directives can be modified as your situation changes and you can change your mind at any time even after you have signed the advance directives. Each state has its own laws regarding advance directives. You may want to check with your health care provider, attorney, or state representative about the law in your state. Below are some examples of advance directives. LIVING WILL A living will is a set of instructions  documenting your wishes about medical care when you cannot care for yourself. It is used if you become:  Terminally ill.  Incapacitated.  Unable to communicate.  Unable to make decisions. Items to consider in your living will include:  The use or non-use of life-sustaining equipment, such as dialysis machines and breathing machines (ventilators).  A "do not resuscitate" (DNR) order, which is the instruction not to use CPR if breathing or heartbeat stops.  Tube feeding.  Withholding of food and fluids.  Comfort (palliative) care when the goal becomes comfort rather than a cure.  Organ and tissue donation. A living does not give instructions about distribution of your money and property if you should pass away. It is advisable to seek the expert advice of a lawyer in drawing up a will regarding your possessions. Decisions about taxes, beneficiaries, and asset distribution will be legally binding. This process can relieve your family and friends of any burdens surrounding disputes or questions that may come up about the allocation of your assets. DO NOT RESUSCITATE (DNR) A do not resuscitate (DNR) order is a request to not have cardiopulmonary resuscitation (CPR) in the event that your heart stops or you stop breathing. Unless given other instructions, a health care provider will try to help any patient whose heart has stopped or who has stopped breathing.  HEALTHCARE PROXY AND DURABLE POWER OF ATTORNEY FOR HEALTH CARE A health care proxy is a person (agent) appointed to make medical decisions for you if you cannot. Generally, people choose someone they know well and trust to represent their preferences when they can no longer do so. You should be sure to ask this person for agreement to act as your agent. An agent may have to exercise judgment in the event of a medical decision for which your wishes are not known. The durable power of attorney for health care is the legal document that names  your health care proxy. Once written, it should be:  Signed.  Notarized.  Dated.  Copied.  Witnessed.  Incorporated into your medical record. You may also  want to appoint someone to manage your financial affairs if you cannot. This is called a durable power of attorney for finances. It is a separate legal document from the durable power of attorney for health care. You may choose the same person or someone different from your health care proxy to act as your agent in financial matters. Document Released: 12/21/2007 Document Revised: 05/16/2013 Document Reviewed: 01/31/2013 [facility] Patient Information 2014 [place], [place].

## 2013-11-19 NOTE — Progress Notes (Signed)
Subjective:   Andrea [name] is a 80 y.o. female who presents for Medicare Annual Wellness Visit and 3 month follow up on hypertension, prediabetes, hyperlipidemia, vitamin D def.  Date of last medicare wellness visit is unknown.   Her blood pressure has not been controlled at home, today their BP is BP: 158/72 mmHg She denies chest pain, shortness of breath, dizziness.  Her cholesterol is diet controlled. Her cholesterol is not controlled. The cholesterol last visit was:  LDL 142 She has been working on diet and exercise for prediabetes, and has no polyuria or polydipsia, no chest pain, dyspnea or TIAs, no numbness, tingling or pain in extremities. Last A1C in the office was: 6.0 Patient is on Vitamin D supplement.  Complains of weakness and intention tremor since coming out of the hospital.   Names of Other Physician/Practitioners you currently use: 1. [facility]- here for primary care 2. [facility], eye doctor, last visit unknown 3. unknown, dentist, last visit 2 years Patient Care Team: Unk Pinto, MD as PCP - General (Internal Medicine) Deneise Lever, MD as Consulting Physician (Pulmonary Disease) Lelon Perla, MD as Consulting Physician (Cardiology) Inda Castle, MD as Consulting Physician (Gastroenterology) Selinda Orion, MD as Consulting Physician (Dermatology)  Medical Services you may have received from other than [facility] providers in the past year (date may be approximate) N/A  Medication Review Current Outpatient Prescriptions on File Prior to Visit  Medication Sig Dispense Refill  . ALPRAZolam (XANAX) 0.5 MG tablet Take 0.5 tablets (0.25 mg total) by mouth at bedtime as needed.  90 tablet  1  . amLODipine (NORVASC) 5 MG tablet 1/2 pill PRN if blood pressure is greater than 150/90  30 tablet  3  . Ascorbic Acid (VITAMIN C) 500 MG tablet Take 500 mg by mouth daily.        . cholecalciferol (VITAMIN D) 1000 UNITS tablet Take  1,000 Units by mouth 2 (two) times daily.      [name] levothyroxine (SYNTHROID, LEVOTHROID) 50 MCG tablet Take 1 tablet (50 mcg total) by mouth as directed. Take 0.5 tablets every day except Sunday.  90 tablet  1  . magnesium gluconate (MAGONATE) 500 MG tablet Take 500 mg by mouth 2 (two) times daily.      . meloxicam (MOBIC) 15 MG tablet Take 7.5-15 mg by mouth daily.      [name] omeprazole (PRILOSEC) 40 MG capsule Take 1 capsule (40 mg total) by mouth daily.  90 capsule  1   No current facility-administered medications on file prior to visit.    Current Problems (verified) Patient Active Problem List   Diagnosis Date Noted  . CAP (community acquired pneumonia) 10/02/2013  . Hypotension, unspecified 10/02/2013  . Acute respiratory failure with hypoxia 10/01/2013  . Hypokalemia 10/01/2013  . Hyponatremia 10/01/2013  . Mixed hyperlipidemia 08/10/2013  . Other abnormal glucose 08/10/2013  . Obstructive chronic bronchitis without exacerbation 08/10/2013  . Generalized osteoarthrosis, involving multiple sites 08/10/2013  . DJD (degenerative joint disease)   . Mild mitral regurgitation by prior echocardiogram   . Mild tricuspid regurgitation   . HYPERTENSION 05/08/2010  . Chronic airway obstruction, not elsewhere classified 05/08/2010  . LUNG NODULE 05/08/2010    Screening Tests Health Maintenance  Topic Date Due  . Colonoscopy  02/25/1981  . Zostavax  02/26/1991  . Pneumococcal Polysaccharide Vaccine Age 32 And Over  02/26/1996  . Influenza Vaccine  04/27/2014  . Tetanus/tdap  06/27/2023  Health Maintenance Topics with due status: Overdue     Topic Date Due   COLONOSCOPY 02/25/1981   ZOSTAVAX 02/26/1991   PNEUMOCOCCAL POLYSACCHARIDE VACCINE AGE 34 AND OVER 02/26/1996    Immunization History  Administered Date(s) Administered  . Influenza Split 07/16/2013  . Td 06/26/2013    Preventative care: Last colonoscopy: 2008 Last mammogram: 07/06/2013 Last pap smear/pelvic exam:  remote DEXA: 07/06/2013 + osteoporosis  Prior vaccinations: TD or Tdap: 2014  Influenza: 06/2013  Pneumococcal: 2008 Shingles/Zostavax: declines  History reviewed: allergies, current medications, past family history, past medical history, past social history, past surgical history and problem list   Risk Factors: Osteoporosis: postmenopausal estrogen deficiency, dietary calcium and/or vitamin D deficiency, anorexia and amenorrhea History of fracture in the past year: no  Tobacco History  Smoking status  . Former Smoker -- 1.00 packs/day for 20 years  . Types: Cigarettes  . Quit date: 09/27/1974  Smokeless tobacco  . Never Used   She does not smoke.  Patient is a former smoker. Are there smokers in your home (other than you)?  No  Alcohol Current alcohol use: none  Caffeine Current caffeine use: coffee 1 /day  Exercise Exercise limitations: The patient has no exercise limitations. Current exercise: walking  Nutrition/Diet Current diet: in general, a "healthy" diet    Cardiac risk factors: advanced age (older than 63 for men, 75 for women), diabetes mellitus, dyslipidemia, family history of premature cardiovascular disease and hypertension.  Depression Screen Nurse depression screen reviewed.  (Note: if answer to either of the following is "Yes", a more complete depression screening is indicated)   Q1: Over the past two weeks, have you felt down, depressed or hopeless? No  Q2: Over the past two weeks, have you felt little interest or pleasure in doing things? No  Have you lost interest or pleasure in daily life? No  Do you often feel hopeless? No  Do you cry easily over simple problems? No  Activities of Daily Living Nurse ADLs screen reviewed.  In your present state of health, do you have any difficulty performing the following activities?:  Driving? No Managing money?  No Feeding yourself? No Getting from bed to chair? No Climbing a flight of stairs?  No Preparing food and eating?: No Bathing or showering? No Getting dressed: No Getting to the toilet? No Using the toilet:No Moving around from place to place: No In the past year have you fallen or had a near fall?:Yes   Are you sexually active?  No  Do you have more than one partner?  No  Vision Difficulties: Yes  Hearing Difficulties: Yes Do you often ask people to speak up or repeat themselves? Yes Do you experience ringing or noises in your ears? No Do you have difficulty understanding soft or whispered voices? Yes  Cognition  Do you feel that you have a problem with memory? No  Do you often misplace items? No  Do you feel safe at home?  Yes  Advanced directives Does patient have a [place]? No Does patient have a Living Will? No    Objective:     Vision and hearing screens reviewed.   Blood pressure 158/72, pulse 76, temperature 98.4 F (36.9 C), resp. rate 16, weight 96 lb (43.545 kg). Body mass index is 17.01 kg/(m^2).  Wt Readings from Last 3 Encounters:  11/19/13 96 lb (43.545 kg)  11/01/13 92 lb (41.731 kg)  10/08/13 97 lb (43.999 kg)    General appearance:  alert, cachetic, no distress, WD/WN,  female Cognitive Testing  Alert? Yes  Normal Appearance?Yes  Oriented to person? Yes  Place? Yes   Time? Yes  Recall of three objects?  Yes  Can perform simple calculations? Yes  Displays appropriate judgment?Yes  Can read the correct time from a watch face?Yes  HEENT: normocephalic, sclerae anicteric, TMs pearly, nares patent, no discharge or erythema, pharynx normal Oral cavity: MMM, no lesions Neck: supple, no lymphadenopathy, no thyromegaly, no masses Heart: RRR, normal S1, S2, no murmurs Lungs: CTA bilaterally, no wheezes, rhonchi, or rales Abdomen: +bs, soft, non tender, non distended, no masses, no hepatomegaly, no splenomegaly Musculoskeletal: nontender, no swelling, no obvious deformity Extremities: 1-2+ edema, no cyanosis,  no clubbing Pulses: 2+ symmetric, upper and lower extremities, normal cap refill Neurological: alert, oriented x 3, CN2-12 intact, strength normal upper extremities and lower extremities, sensation normal throughout, DTRs 2+ throughout, slight intention tremor, bilateral hands. Psychiatric: normal affect, behavior normal, pleasant  Breast: defer Gyn: defer Rectal: defer   Assessment:   COPD- ? If need oxygen at night, does not need it during the day- get over night pulse ox OFF OXYGEN- Seeing Dr. Joya Gaskins and will get PFTs.  Underweight- add ensure/boost HYPERTENSION - Not at goal, increase Atenolol to BID, continue losartan 100 1/2 in AM, 1/2 in PM, maxide 1/2 QOD, and take Norvasc 1/2 PRN at night. Plan: CBC with Differential, BASIC METABOLIC PANEL WITH GFR, Hepatic function panel, TSH Mixed hyperlipidemia - Plan: Lipid panel, declines Statin Other abnormal glucose - Plan: Hemoglobin A1c Hyponatremia - Plan: BASIC METABOLIC PANEL WITH GFR, Magnesium Hypokalemia - Plan: Magnesium Intention tremor, likely from decrease in atenolol  Plan:   During the course of the visit the patient was educated and counseled about appropriate screening and preventive services including:    Influenza vaccine  Screening electrocardiogram  Diabetes screening  Glaucoma screening  Nutrition counseling   Advanced directives: has NO advanced directive - not interested in additional information  Screening recommendations, referrals:  Vaccinations: Tdap vaccine yes  Influenza vaccine no Pneumococcal vaccine no Shingles vaccine yes Hep B vaccine no  Nutrition assessed and recommended  Colonoscopy declines Mammogram due 06/2014 Pap smear not done, declines Pelvic exam not done Recommended yearly ophthalmology/optometry visit for glaucoma screening and checkup Recommended yearly dental visit for hygiene and checkup Advanced directives - not done- information given.   Conditions/risks  identified: BMI: Discussed weight Gain, will add ensure, diet, and increase physical activity.  Increase physical activity: AHA recommends 150 minutes of physical activity a week.  Medications reviewed DEXA- Due 2016 but patient declines treatment Diabetes is at goal, ACE/ARB therapy: Yes. Depression screening negative Urinary Incontinence is not an issue: discussed non pharmacology and pharmacology options.  Fall risk: moderate to high- discussed PT, home fall assessment, medications.  + osteoporosis but declines treatment Uncontrolled HTN- increased atenolol, monitor at home.   Medicare Attestation I have personally reviewed: The patient's medical and social history Their use of alcohol, tobacco or illicit drugs Their current medications and supplements The patient's functional ability including ADLs,fall risks, home safety risks, cognitive, and hearing and visual impairment Diet and physical activities Evidence for depression or mood disorders  The patient's weight, height, BMI, and visual acuity have been recorded in the chart.  I have made referrals, counseling, and provided education to the patient based on review of the above and I have provided the patient with a written personalized care plan for preventive services.     Silverio Lay,  Estill Bamberg, Hershal Coria   11/19/2013

## 2013-11-23 ENCOUNTER — Other Ambulatory Visit: Payer: Self-pay | Admitting: Physician Assistant

## 2013-11-23 DIAGNOSIS — J441 Chronic obstructive pulmonary disease with (acute) exacerbation: Secondary | ICD-10-CM

## 2013-11-26 ENCOUNTER — Telehealth: Payer: Self-pay | Admitting: Internal Medicine

## 2013-11-26 NOTE — Telephone Encounter (Signed)
Pt called to say the oxygen not picked up by [facility].  She called them and they had no order to pick. Please advise.  Also pt wants status of o2 sensor. Please advise pt. pts 2nd call to us/  Thanks, Adonis Huguenin

## 2013-11-27 ENCOUNTER — Institutional Professional Consult (permissible substitution): Payer: Medicare Other | Admitting: Internal Medicine

## 2013-11-27 NOTE — Telephone Encounter (Signed)
Rhett Bannister Has taken care of this issue and has sent an order to [facility] regarding pick up.

## 2013-12-18 ENCOUNTER — Ambulatory Visit (INDEPENDENT_AMBULATORY_CARE_PROVIDER_SITE_OTHER): Payer: Medicare Other | Admitting: Internal Medicine

## 2013-12-18 ENCOUNTER — Encounter: Payer: Self-pay | Admitting: Internal Medicine

## 2013-12-18 VITALS — BP 128/80 | HR 71 | Ht 63.0 in | Wt 96.6 lb

## 2013-12-18 DIAGNOSIS — J449 Chronic obstructive pulmonary disease, unspecified: Secondary | ICD-10-CM

## 2013-12-18 DIAGNOSIS — J439 Emphysema, unspecified: Secondary | ICD-10-CM

## 2013-12-18 DIAGNOSIS — J984 Other disorders of lung: Secondary | ICD-10-CM

## 2013-12-18 DIAGNOSIS — J438 Other emphysema: Secondary | ICD-10-CM

## 2013-12-18 NOTE — Progress Notes (Signed)
12/18/13- 10 yoF former smoker [name]; sent back by [facility] Melford Aase.   For reassessment of COPD  History of lung nodules which were stable at last visit 05/08/2010 with CT chest 05/12/2010. Known emphysema and stable subcentimeter bilateral pulmonary nodules. Several episodes of pneumonia. Has used oxygen for sleep. Paces herself during the day and denies cough while recognizing dyspnea on exertion with brisk walking. History of mild valvular heart disease, hypertension, irregular heartbeat. No history of TB. Lives with husband CXR 10/01/13 IMPRESSION:  Emphysema with airspace disease in the right base suggesting  pneumonia.  Electronically Signed  By: Misty Stanley M.D.  On: 10/01/2013 12:49  Prior to Admission medications   Medication Sig Start Date End Date Taking? Authorizing Provider  ALPRAZolam Duanne Moron) 0.5 MG tablet Take 0.5 tablets (0.25 mg total) by mouth at bedtime as needed. 11/01/13  Yes Vicie Mutters, PA-C  amLODipine (NORVASC) 5 MG tablet 1/2 pill PRN if blood pressure is greater than 150/90 11/01/13 11/01/14 Yes Vicie Mutters, PA-C  Ascorbic Acid (VITAMIN C) 500 MG tablet Take 500 mg by mouth daily.     Yes Historical Provider, MD  atenolol (TENORMIN) 100 MG tablet Take 1 tablet by mouth daily. 11/20/13  Yes Historical Provider, MD  cholecalciferol (VITAMIN D) 1000 UNITS tablet Take 1,000 Units by mouth 2 (two) times daily.   Yes Historical Provider, MD  levothyroxine (SYNTHROID, LEVOTHROID) 50 MCG tablet Take 1 tablet (50 mcg total) by mouth as directed. Take 0.5 tablets every day except Sunday. 10/30/13  Yes Unk Pinto, MD  magnesium gluconate (MAGONATE) 500 MG tablet Take 500 mg by mouth 2 (two) times daily.   Yes Historical Provider, MD  omeprazole (PRILOSEC) 40 MG capsule Take 1 capsule (40 mg total) by mouth daily. 10/30/13  Yes Unk Pinto, MD   Past Medical History  Diagnosis Date  . Palpitations   . HLD (hyperlipidemia)   . DJD  (degenerative joint disease)   . Mild mitral regurgitation by prior echocardiogram   . Mild tricuspid regurgitation   . HTN (hypertension)    Past Surgical History  Procedure Laterality Date  . Abdominal hysterectomy  1993  . Cataract extraction, bilateral     Family History  Problem Relation Age of Onset  . Cancer Father   . Emphysema Sister   . Emphysema Sister   . Emphysema Mother   . Rheumatic fever Sister   . Heart attack Sister   . Diabetes Mother   . Diabetes Sister   . Diabetes Brother   . Asthma Brother     as a child   History   Social History  . Marital Status: Married    Spouse Name: N/A    Number of Children: 78  . Years of Education: N/A   Occupational History  . retired    Social History Main Topics  . Smoking status: Former Smoker -- 1.00 packs/day for 20 years    Types: Cigarettes    Quit date: 09/27/1974  . Smokeless tobacco: Never Used  . Alcohol Use: No  . Drug Use: No  . Sexual Activity: No   Other Topics Concern  . Not on file   Social History Narrative  . No narrative on file   ROS-see HPI Constitutional:   No-   weight loss, night sweats, fevers, chills, fatigue, lassitude. HEENT:   No-  headaches, difficulty swallowing, tooth/dental problems, sore throat,       No-  sneezing, itching, ear ache, nasal congestion, post  nasal drip,  CV:  No-   chest pain, orthopnea, PND, swelling in lower extremities, anasarca,                                  dizziness, palpitations Resp: +shortness of breath with exertion or at rest.              No-   productive cough,  No non-productive cough,  No- coughing up of blood.              No-   change in color of mucus.  No- wheezing.   Skin: No-   rash or lesions. GI:  No-   heartburn, indigestion, abdominal pain, nausea, vomiting, diarrhea,                 change in bowel habits, loss of appetite GU: No-   dysuria, change in color of urine, no urgency or frequency.  No- flank pain. MS:  No-   joint  pain or swelling.  No- decreased range of motion.  No- back pain. Neuro-     nothing unusual Psych:  No- change in mood or affect. No depression or anxiety.  No memory loss.  OBJ- Physical Exam General- Alert, Oriented, Affect-appropriate, Distress- none acute. Lean Skin- rash-none, lesions- none, excoriation- none Lymphadenopathy- none Head- atraumatic            Eyes- Gross vision intact, PERRLA, conjunctivae and secretions clear            Ears- Hearing, canals-normal            Nose- Clear, no-Septal dev, mucus, polyps, erosion, perforation             Throat- Mallampati II , mucosa clear , drainage- none, tonsils- atrophic. Own teeth Neck- flexible , trachea midline, no stridor , thyroid nl, carotid no bruit Chest - symmetrical excursion , unlabored           Heart/CV- RRR , no murmur , no gallop  , no rub, nl s1 s2                           - JVD- none , edema- none, stasis changes- none, varices- none           Lung- clear to P&A, wheeze- none, cough- none , dullness-none, rub- none           Chest wall-  Abd- tender-no, distended-no, bowel sounds-present, HSM- no Br/ Gen/ Rectal- Not done, not indicated Extrem- cyanosis- none, clubbing, none, atrophy- none, strength- nl Neuro- grossly intact to observation

## 2013-12-18 NOTE — Patient Instructions (Signed)
Order- [facility] [place] on room air     Dx COPD

## 2013-12-24 ENCOUNTER — Telehealth: Payer: Self-pay | Admitting: Internal Medicine

## 2013-12-24 NOTE — Telephone Encounter (Signed)
Spoke with patient-she states that [facility] did her ONO on Thursday last week; she is aware that I will contact [facility] tomorrow to find out where the results are.

## 2013-12-25 NOTE — Telephone Encounter (Signed)
[facility] please advise. Results attached. Thanks.

## 2013-12-25 NOTE — Telephone Encounter (Signed)
I have sent staff message to Darlina Guys to find out where the ONO results are.

## 2013-12-26 NOTE — Telephone Encounter (Signed)
The ONOX report showed that her oxygen level did drop enough at night to qualify for O2 2L during sleep. I would recommend she keep it.

## 2013-12-26 NOTE — Telephone Encounter (Signed)
I called made pt aware. Nothing further needed 

## 2013-12-28 ENCOUNTER — Telehealth: Payer: Self-pay | Admitting: Physician Assistant

## 2013-12-28 NOTE — Telephone Encounter (Signed)
Atenolol 1/2 AM, and 1/2 PM, Losartan 100 1/2 AM, 1/2 PM, Maxide 1/2 QOD and amlodipine 46m PRN and her BP has gotten as high as 180/100 at night , she has had some swelling recently in her legs, no CP, SOB, PND, orthopnea. We will increase her maxide 1/2 everyday and she can take 1/2 of norvasc if needed. She will go to the ER if any CP, SOB, changes in speech, dizziness, etc.

## 2014-01-07 NOTE — Assessment & Plan Note (Signed)
She is not describing bronchitis symptoms of cough or wheeze. No history of pneumonias. Plan-overnight oximetry on room air

## 2014-01-07 NOTE — Assessment & Plan Note (Signed)
We can follow with occasional chest x-ray

## 2014-01-18 ENCOUNTER — Other Ambulatory Visit: Payer: Self-pay | Admitting: *Deleted

## 2014-01-18 MED ORDER — ATENOLOL 100 MG PO TABS: 100.0000 mg | ORAL_TABLET | Freq: Every day | ORAL | Status: DC

## 2014-01-23 ENCOUNTER — Ambulatory Visit (INDEPENDENT_AMBULATORY_CARE_PROVIDER_SITE_OTHER): Payer: Medicare Other | Admitting: Internal Medicine

## 2014-01-23 ENCOUNTER — Encounter: Payer: Self-pay | Admitting: Internal Medicine

## 2014-01-23 VITALS — BP 162/78 | HR 72 | Temp 98.1°F | Resp 18 | Ht 63.0 in | Wt 96.8 lb

## 2014-01-23 DIAGNOSIS — E559 Vitamin D deficiency, unspecified: Secondary | ICD-10-CM

## 2014-01-23 DIAGNOSIS — E782 Mixed hyperlipidemia: Secondary | ICD-10-CM

## 2014-01-23 DIAGNOSIS — I10 Essential (primary) hypertension: Secondary | ICD-10-CM

## 2014-01-23 DIAGNOSIS — Z79899 Other long term (current) drug therapy: Secondary | ICD-10-CM

## 2014-01-23 DIAGNOSIS — R7309 Other abnormal glucose: Secondary | ICD-10-CM

## 2014-01-23 LAB — HEMOGLOBIN A1C
HEMOGLOBIN A1C: 6 % — AB (ref <5.7)
Mean Plasma Glucose: 126 mg/dL — ABNORMAL HIGH (ref <117)

## 2014-01-23 LAB — CBC WITH DIFFERENTIAL/PLATELET
Basophils Absolute: 0 10*3/uL (ref 0.0–0.1)
Basophils Relative: 0 % (ref 0–1)
Eosinophils Absolute: 0.1 10*3/uL (ref 0.0–0.7)
Eosinophils Relative: 2 % (ref 0–5)
HCT: 37.6 % (ref 36.0–46.0)
HEMOGLOBIN: 13 g/dL (ref 12.0–15.0)
LYMPHS ABS: 1.3 10*3/uL (ref 0.7–4.0)
Lymphocytes Relative: 18 % (ref 12–46)
MCH: 31.1 pg (ref 26.0–34.0)
MCHC: 34.6 g/dL (ref 30.0–36.0)
MCV: 90 fL (ref 78.0–100.0)
Monocytes Absolute: 0.6 10*3/uL (ref 0.1–1.0)
Monocytes Relative: 8 % (ref 3–12)
NEUTROS PCT: 72 % (ref 43–77)
Neutro Abs: 5.2 10*3/uL (ref 1.7–7.7)
Platelets: 242 10*3/uL (ref 150–400)
RBC: 4.18 MIL/uL (ref 3.87–5.11)
RDW: 13.1 % (ref 11.5–15.5)
WBC: 7.2 10*3/uL (ref 4.0–10.5)

## 2014-01-23 MED ORDER — LISINOPRIL 20 MG PO TABS: 20.0000 mg | ORAL_TABLET | Freq: Every day | ORAL | Status: DC

## 2014-01-23 NOTE — Progress Notes (Signed)
Patient ID: Andrea [name], female   DOB: 05-20-1929, 80 y.o.   MRN: [id number]    This very nice 80 y.o. MWF presents for 3 month follow up with Hypertension, Hyperlipidemia, Pre-Diabetes and Vitamin D Deficiency.    HTN predates since the 1990's. BP has been ranging 160-196/86-90+ at home, but today's BP is 162/78 mmHg . Patient denies any cardiac type chest pain, palpitations, dyspnea/orthopnea/PND, dizziness, claudication, or dependent edema.   Hyperlipidemia is not controlled with diet & patient is statin intolerent. Last Cholesterol was 232, Triglycerides were 68, HDL 99 and LDL 119. Patient denies myalgias or other med SE's.    Also, the patient has history of PreDiabetes with A1c 5.9% in Sept 2012 and last A1c was 6.0% in Oct 2014. Patient denies any symptoms of reactive hypoglycemia, diabetic polys, paresthesias or visual blurring.   Further, Patient has history of Vitamin D Deficiency of 39 in 2009with last vitamin D of   . Patient supplements vitamin D without any suspected side-effects.  Medication Sig  . ALPRAZolam (XANAX) 0.5 MG tablet Take 0.5 tablets at bedtime as needed.  [name] amLODipine (NORVASC) 5 MG tablet 1/2 pill PRN if blood pressure is greater than 150/90  . Ascorbic Acid (VITAMIN C) 500 MG tablet Take 500 mg by mouth daily.    [name] atenolol (TENORMIN) 100 MG tablet Take 1 tablet (100 mg total) by mouth daily.  . cholecalciferol (VITAMIN D) 1000 UNITS tablet Take 1,000 Units by mouth 2 (two) times daily.  [name] levothyroxine  50 MCG tablet Take 0.5 tablets every day except Sunday.  . magnesium gluconate  500 MG Take 500 mg by mouth daily.   [name] omeprazole (PRILOSEC) 40 MG capsule Take 1 capsule (40 mg total) by mouth daily.    Allergies  Allergen Reactions  . Sulfa Antibiotics Other (See Comments)    Unknown   PMHx:   Past Medical History  Diagnosis Date  . Palpitations   . HLD (hyperlipidemia)   . DJD (degenerative joint disease)   . Mild mitral regurgitation by prior  echocardiogram   . Mild tricuspid regurgitation   . HTN (hypertension)     FHx:    Reviewed / unchanged  SHx:    Reviewed / unchanged   Systems Review: Constitutional: Denies fever, chills, wt changes, headaches, insomnia, fatigue, night sweats, change in appetite. Eyes: Denies redness, blurred vision, diplopia, discharge, itchy, watery eyes.  ENT: Denies discharge, congestion, post nasal drip, epistaxis, sore throat, earache, hearing loss, dental pain, tinnitus, vertigo, sinus pain, snoring.  CV: Denies chest pain, palpitations, irregular heartbeat, syncope, dyspnea, diaphoresis, orthopnea, PND, claudication, edema. Respiratory: denies cough, dyspnea, DOE, pleurisy, hoarseness, laryngitis, wheezing.  Gastrointestinal: Denies dysphagia, odynophagia, heartburn, reflux, water brash, abdominal pain or cramps, nausea, vomiting, bloating, diarrhea, constipation, hematemesis, melena, hematochezia,  or hemorrhoids. Genitourinary: Denies dysuria, frequency, urgency, nocturia, hesitancy, discharge, hematuria, flank pain. Musculoskeletal: Denies arthralgias, myalgias, stiffness, jt. swelling, pain, limp, strain/sprain.  Skin: Denies pruritus, rash, hives, warts, acne, eczema, change in skin lesion(s). Neuro: No weakness, tremor, incoordination, spasms, paresthesia, or pain. Psychiatric: Denies confusion, memory loss, or sensory loss. Endo: Denies change in weight, skin, hair change.  Heme/Lymph: No excessive bleeding, bruising, orenlarged lymph nodes.  Exam:  BP 162/78  Pulse 72  Temp 98.1 F   Resp 18  Ht _0    Wt 96 lb 12.8 oz   BMI 17.15 kg/m2  Appears very thin but in no distress in no distress. Eyes: PERRLA, EOMs, conjunctiva no swelling or  erythema. Sinuses: No frontal/maxillary tenderness ENT/Mouth: EAC's clear, TM's nl w/o erythema, bulging. Nares clear w/o erythema, swelling, exudates. Oropharynx clear without erythema or exudates. Oral hygiene is good. Tongue normal, non  obstructing. Hearing intact.  Neck: Supple. Thyroid nl. Car 2+/2+ without bruits, nodes or JVD. Chest: Respirations nl with BS clear & equal w/o rales, rhonchi, wheezing or stridor.  Cor: Heart sounds normal w/ regular rate and rhythm without sig. murmurs, gallops, clicks, or rubs. Peripheral pulses normal and equal  without edema.  Abdomen: Soft & bowel sounds normal. Non-tender w/o guarding, rebound, hernias, masses, or organomegaly.  Lymphatics: Unremarkable.  Musculoskeletal: Full ROM all peripheral extremities, joint stability, 5/5 strength, and normal gait.  Skin: Warm, dry without exposed rashes, lesions, ecchymosis apparent.  Neuro: Cranial nerves intact, reflexes equal bilaterally. Sensory-motor testing grossly intact. Tendon reflexes grossly intact.  Pysch: Alert & oriented x 3. Insight and judgement nl & appropriate. No ideations.  Assessment and Plan:  1. Hypertension - Not controlled Continue monitor blood pressure at home. Continue diet/meds same with addition of adding lisinopril 20 mg qd & call if BP remains elevated  2. Hyperlipidemia - Continue diet, exercise,& lifestyle modifications. Continue monitor periodic cholesterol/liver & renal functions   3. Pre-diabetes - Continue diet, exercise, lifestyle modifications. Monitor appropriate labs.  4. Vitamin D Deficiency - Continue supplementation.  Recommended regular exercise, BP monitoring, weight control, and discussed med and SE's. Recommended labs to assess and monitor clinical status. Further disposition pending results of labs.

## 2014-01-23 NOTE — Patient Instructions (Signed)

## 2014-01-24 LAB — HEPATIC FUNCTION PANEL
ALT: 11 U/L (ref 0–35)
AST: 21 U/L (ref 0–37)
Albumin: 4.3 g/dL (ref 3.5–5.2)
Alkaline Phosphatase: 76 U/L (ref 39–117)
BILIRUBIN DIRECT: 0.1 mg/dL (ref 0.0–0.3)
BILIRUBIN INDIRECT: 0.5 mg/dL (ref 0.2–1.2)
BILIRUBIN TOTAL: 0.6 mg/dL (ref 0.2–1.2)
Total Protein: 7.5 g/dL (ref 6.0–8.3)

## 2014-01-24 LAB — BASIC METABOLIC PANEL WITH GFR
BUN: 18 mg/dL (ref 6–23)
CALCIUM: 10.2 mg/dL (ref 8.4–10.5)
CO2: 34 meq/L — AB (ref 19–32)
Chloride: 94 mEq/L — ABNORMAL LOW (ref 96–112)
Creat: 0.73 mg/dL (ref 0.50–1.10)
GFR, Est African American: 89 mL/min
GFR, Est Non African American: 77 mL/min
GLUCOSE: 94 mg/dL (ref 70–99)
POTASSIUM: 4 meq/L (ref 3.5–5.3)
SODIUM: 135 meq/L (ref 135–145)

## 2014-01-24 LAB — LIPID PANEL
CHOL/HDL RATIO: 2.3 ratio
Cholesterol: 223 mg/dL — ABNORMAL HIGH (ref 0–200)
HDL: 97 mg/dL (ref >39)
LDL Cholesterol: 113 mg/dL — ABNORMAL HIGH (ref 0–99)
Triglycerides: 65 mg/dL (ref <150)
VLDL: 13 mg/dL (ref 0–40)

## 2014-01-24 LAB — MAGNESIUM: MAGNESIUM: 1.9 mg/dL (ref 1.5–2.5)

## 2014-01-24 LAB — TSH: TSH: 2.725 u[IU]/mL (ref 0.350–4.500)

## 2014-01-24 LAB — VITAMIN D 25 HYDROXY (VIT D DEFICIENCY, FRACTURES): Vit D, 25-Hydroxy: 59 ng/mL (ref 30–89)

## 2014-01-24 LAB — INSULIN, FASTING: Insulin fasting, serum: 5 u[IU]/mL (ref 3–28)

## 2014-01-29 ENCOUNTER — Ambulatory Visit: Payer: Medicare Other | Admitting: Internal Medicine

## 2014-02-08 ENCOUNTER — Other Ambulatory Visit: Payer: Self-pay | Admitting: Internal Medicine

## 2014-02-08 ENCOUNTER — Other Ambulatory Visit: Payer: Self-pay | Admitting: Physician Assistant

## 2014-02-08 MED ORDER — TRIAMTERENE-HCTZ 75-50 MG PO TABS: ORAL_TABLET | ORAL | Status: DC

## 2014-02-19 ENCOUNTER — Ambulatory Visit: Payer: Self-pay | Admitting: Internal Medicine

## 2014-02-26 ENCOUNTER — Ambulatory Visit (INDEPENDENT_AMBULATORY_CARE_PROVIDER_SITE_OTHER): Payer: Medicare Other | Admitting: Emergency Medicine

## 2014-02-26 ENCOUNTER — Encounter: Payer: Self-pay | Admitting: Emergency Medicine

## 2014-02-26 VITALS — BP 128/74 | HR 70 | Temp 98.0°F | Resp 16 | Ht 63.0 in | Wt 96.0 lb

## 2014-02-26 DIAGNOSIS — I10 Essential (primary) hypertension: Secondary | ICD-10-CM

## 2014-02-26 DIAGNOSIS — Z23 Encounter for immunization: Secondary | ICD-10-CM

## 2014-02-26 MED ORDER — PNEUMOCOCCAL 13-VAL CONJ VACC IM SUSP
0.5000 mL | Freq: Once | INTRAMUSCULAR | Status: AC
  Administered 2014-02-26: 0.5 mL via INTRAMUSCULAR

## 2014-02-26 NOTE — Patient Instructions (Signed)
prevnar 13 Pneumococcal Vaccine, Polyvalent suspension for injection What is this medicine? PNEUMOCOCCAL VACCINE, POLYVALENT (NEU mo KOK al vak SEEN, pol ee VEY luhnt) is a vaccine to prevent pneumococcus bacteria infection. These bacteria are a major cause of ear infections, 'Strep throat' infections, and serious pneumonia, meningitis, or blood infections worldwide. These vaccines help the body to produce antibodies (protective substances) that help your body defend against these bacteria. This vaccine is recommended for infants and young children. This vaccine will not treat an infection. This medicine may be used for other purposes; ask your health care provider or pharmacist if you have questions. COMMON BRAND NAME(S): Prevnar 13 , Prevnar What should I tell my health care provider before I take this medicine? They need to know if you have any of these conditions: -bleeding problems -fever -immune system problems -low platelet count in the blood -seizures -an unusual or allergic reaction to pneumococcal vaccine, diphtheria toxoid, other vaccines, latex, other medicines, foods, dyes, or preservatives -pregnant or trying to get pregnant -breast-feeding How should I use this medicine? This vaccine is for injection into a muscle. It is given by a health care professional. A copy of Vaccine Information Statements will be given before each vaccination. Read this sheet carefully each time. The sheet may change frequently. Talk to your pediatrician regarding the use of this medicine in children. While this drug may be prescribed for children as young as 78 weeks old for selected conditions, precautions do apply. Overdosage: If you think you have taken too much of this medicine contact a poison control center or emergency room at once. NOTE: This medicine is only for you. Do not share this medicine with others. What if I miss a dose? It is important not to miss your dose. Call your doctor or health  care professional if you are unable to keep an appointment. What may interact with this medicine? -medicines for cancer chemotherapy -medicines that suppress your immune function -medicines that treat or prevent blood clots like warfarin, enoxaparin, and dalteparin -steroid medicines like prednisone or cortisone This list may not describe all possible interactions. Give your health care provider a list of all the medicines, herbs, non-prescription drugs, or dietary supplements you use. Also tell them if you smoke, drink alcohol, or use illegal drugs. Some items may interact with your medicine. What should I watch for while using this medicine? Mild fever and pain should go away in 3 days or less. Report any unusual symptoms to your doctor or health care professional. What side effects may I notice from receiving this medicine? Side effects that you should report to your doctor or health care professional as soon as possible: -allergic reactions like skin rash, itching or hives, swelling of the face, lips, or tongue -breathing problems -confused -fever over 102 degrees F -pain, tingling, numbness in the hands or feet -seizures -unusual bleeding or bruising -unusual muscle weakness Side effects that usually do not require medical attention (report to your doctor or health care professional if they continue or are bothersome): -aches and pains -diarrhea -fever of 102 degrees F or less -headache -irritable -loss of appetite -pain, tender at site where injected -trouble sleeping This list may not describe all possible side effects. Call your doctor for medical advice about side effects. You may report side effects to FDA at 1-800-FDA-1088. Where should I keep my medicine? This does not apply. This vaccine is given in a clinic, pharmacy, doctor's office, or other health care setting and will not be  stored at home. NOTE: This sheet is a summary. It may not cover all possible information. If  you have questions about this medicine, talk to your doctor, pharmacist, or health care provider.  2014, Elsevier/Gold Standard. (2008-11-26 10:17:22)

## 2014-02-26 NOTE — Progress Notes (Signed)
Subjective:    Patient ID: Andrea [name], female    DOB: 04-Mar-1929, 80 y.o.   MRN: [id number]  HPI Comments: 80 yo WF for close f/u HTN with new start Lisinopril at last OV. She notes she is doing well overall. She notes BP 120-150s/ 80s.   She has had 3 tick bites 3 weeks ago. She denies any fever/ rash or other neuro changes. She cleaned areas with alcohol and notes still with mild erythema at site only.      Medication List       This list is accurate as of: 02/26/14 11:39 AM.  Always use your most recent med list.               ALPRAZolam 0.5 MG tablet  Commonly known as:  XANAX  Take 0.5 tablets (0.25 mg total) by mouth at bedtime as needed.     amLODipine 5 MG tablet  Commonly known as:  NORVASC  1/2 pill PRN if blood pressure is greater than 150/90     atenolol 100 MG tablet  Commonly known as:  TENORMIN  Take 1 tablet (100 mg total) by mouth daily.     cholecalciferol 1000 UNITS tablet  Commonly known as:  VITAMIN D  Take 1,000 Units by mouth 2 (two) times daily.     levothyroxine 50 MCG tablet  Commonly known as:  SYNTHROID, LEVOTHROID  Take 1 tablet (50 mcg total) by mouth as directed. Take 0.5 tablets every day except Sunday.     lisinopril 20 MG tablet  Commonly known as:  PRINIVIL,ZESTRIL  Take 1 tablet (20 mg total) by mouth daily. For BP     magnesium gluconate 500 MG tablet  Commonly known as:  MAGONATE  Take 500 mg by mouth daily.     omeprazole 40 MG capsule  Commonly known as:  PRILOSEC  Take 1 capsule (40 mg total) by mouth daily.     triamterene-hydrochlorothiazide 75-50 MG per tablet  Commonly known as:  MAXZIDE  Take 1/2 to 1 tablet daily for BP & Fluid as directed     vitamin C 500 MG tablet  Commonly known as:  ASCORBIC ACID  Take 500 mg by mouth daily.          Review of Systems  Skin: Positive for wound.  All other systems reviewed and are negative.  BP 128/74  Pulse 70  Temp(Src) 98 F (36.7 C) (Temporal)  Resp 16   Ht 5' 3" (1.6 m)  Wt 96 lb (43.545 kg)  BMI 17.01 kg/m2     Objective:   Physical Exam  Nursing note and vitals reviewed. Constitutional: She is oriented to person, place, and time. She appears well-developed and well-nourished.  HENT:  Head: Normocephalic and atraumatic.  Right Ear: External ear normal.  Left Ear: External ear normal.  Nose: Nose normal.  Mouth/Throat: Oropharynx is clear and moist.  Eyes: Conjunctivae and EOM are normal.  Neck: Normal range of motion.  Cardiovascular: Normal rate, regular rhythm, normal heart sounds and intact distal pulses.   Pulmonary/Chest: Effort normal and breath sounds normal.  Musculoskeletal: Normal range of motion.  Lymphadenopathy:    She has no cervical adenopathy.  Neurological: She is alert and oriented to person, place, and time.  Skin: Skin is warm and dry.  Left groin 2-3 mm mildly raised with minimal erythema at bite site. No other obvious wounds  Psychiatric: She has a normal mood and affect. Judgment normal.  Assessment & Plan:  1. HTN- Check BP call if >130/80, increase cardio   2.Tick Bite multiple sites- Declines labs w/c if wants checked or if any symptoms occurs

## 2014-02-27 LAB — CBC WITH DIFFERENTIAL/PLATELET
Basophils Absolute: 0 10*3/uL (ref 0.0–0.1)
Basophils Relative: 0 % (ref 0–1)
Eosinophils Absolute: 0.1 10*3/uL (ref 0.0–0.7)
Eosinophils Relative: 2 % (ref 0–5)
HEMATOCRIT: 38.4 % (ref 36.0–46.0)
HEMOGLOBIN: 12.8 g/dL (ref 12.0–15.0)
LYMPHS PCT: 20 % (ref 12–46)
Lymphs Abs: 1.2 10*3/uL (ref 0.7–4.0)
MCH: 30.3 pg (ref 26.0–34.0)
MCHC: 33.3 g/dL (ref 30.0–36.0)
MCV: 90.8 fL (ref 78.0–100.0)
MONO ABS: 0.6 10*3/uL (ref 0.1–1.0)
MONOS PCT: 9 % (ref 3–12)
Neutro Abs: 4.3 10*3/uL (ref 1.7–7.7)
Neutrophils Relative %: 69 % (ref 43–77)
Platelets: 220 10*3/uL (ref 150–400)
RBC: 4.23 MIL/uL (ref 3.87–5.11)
RDW: 12.9 % (ref 11.5–15.5)
WBC: 6.2 10*3/uL (ref 4.0–10.5)

## 2014-02-27 LAB — BASIC METABOLIC PANEL WITH GFR
BUN: 25 mg/dL — ABNORMAL HIGH (ref 6–23)
CHLORIDE: 94 meq/L — AB (ref 96–112)
CO2: 32 meq/L (ref 19–32)
Calcium: 10 mg/dL (ref 8.4–10.5)
Creat: 0.79 mg/dL (ref 0.50–1.10)
GFR, Est African American: 80 mL/min
GFR, Est Non African American: 69 mL/min
GLUCOSE: 89 mg/dL (ref 70–99)
Potassium: 4.2 mEq/L (ref 3.5–5.3)
SODIUM: 134 meq/L — AB (ref 135–145)

## 2014-02-28 ENCOUNTER — Ambulatory Visit (INDEPENDENT_AMBULATORY_CARE_PROVIDER_SITE_OTHER): Payer: Medicare Other | Admitting: Internal Medicine

## 2014-02-28 ENCOUNTER — Encounter: Payer: Self-pay | Admitting: Internal Medicine

## 2014-02-28 VITALS — BP 148/72 | HR 72 | Ht 62.5 in | Wt 98.0 lb

## 2014-02-28 DIAGNOSIS — J439 Emphysema, unspecified: Secondary | ICD-10-CM

## 2014-02-28 DIAGNOSIS — J438 Other emphysema: Secondary | ICD-10-CM

## 2014-02-28 NOTE — Progress Notes (Signed)
12/18/13- 35 yoF former smoker [name]; sent back by [facility] Dr Melford Aase.   For reassessment of COPD  History of lung nodules which were stable at last visit 05/08/2010 with CT chest 05/12/2010. Known emphysema and stable subcentimeter bilateral pulmonary nodules. Several episodes of pneumonia. Has used oxygen for sleep. Paces herself during the day and denies cough while recognizing dyspnea on exertion with brisk walking. History of mild valvular heart disease, hypertension, irregular heartbeat. No history of TB. Lives with husband CXR 10/01/13 IMPRESSION:  Emphysema with airspace disease in the right base suggesting  pneumonia.  Electronically Signed  By: Misty Stanley M.D.  On: 10/01/2013 12:49  02/28/14-  87 yoF former smoker followed for COPD, hx lung nodules,  complicated by HBP, mild valvular heart disease, hypertension, irregular heartbeat FOLLOWS FOR: Pt states breathing has improved since last visit. Pt states she has an increase in energy. C/o mild SOB with activity. C/o dry cough at night. Denies CP/tightness.  Postnasal drip. Avoids antihistamines which overdry her eyes. Uses no respiratory meds. Sleeps with oxygen 2 L started by hospital-she questions need. Walks treadmill at gym  +will need PFT+  ROS-see HPI Constitutional:   No-   weight loss, night sweats, fevers, chills, fatigue, lassitude. HEENT:   No-  headaches, difficulty swallowing, tooth/dental problems, sore throat,       No-  sneezing, itching, ear ache, nasal congestion, post nasal drip,  CV:  No-   chest pain, orthopnea, PND, swelling in lower extremities, anasarca,                                  dizziness, palpitations Resp: +shortness of breath with exertion or at rest.              No-   productive cough,  No non-productive cough,  No- coughing up of blood.              No-   change in color of mucus.  No- wheezing.   Skin: No-   rash or lesions. GI:  No-   heartburn, indigestion,  abdominal pain, nausea, vomiting,  GU:  MS:  No-   joint pain or swelling.  . Neuro-     nothing unusual Psych:  No- change in mood or affect. No depression or anxiety.  No memory loss.  OBJ- Physical Exam General- Alert, Oriented, Affect-appropriate, Distress- none acute. Lean Skin- rash-none, lesions- none, excoriation- none Lymphadenopathy- none Head- atraumatic            Eyes- Gross vision intact, PERRLA, conjunctivae and secretions clear            Ears- +cerumen            Nose- Clear, no-Septal dev, mucus, polyps, erosion, perforation             Throat- Mallampati II , mucosa clear , drainage- none, tonsils- atrophic. Own teeth Neck- flexible , trachea midline, no stridor , thyroid nl, carotid no bruit Chest - symmetrical excursion , unlabored           Heart/CV- RRR , no murmur , no gallop  , no rub, nl s1 s2                           - JVD- none , edema- none, stasis changes- none, varices- none  Lung- clear to P&A, wheeze- none, cough- none , dullness-none, rub- none           Chest wall-  Abd-  Br/ Gen/ Rectal- Not done, not indicated Extrem- cyanosis- none, clubbing, none, atrophy- none, strength- nl Neuro- grossly intact to observation

## 2014-02-28 NOTE — Patient Instructions (Signed)
Order- [place] on room air, dx COPD

## 2014-03-05 ENCOUNTER — Other Ambulatory Visit: Payer: Self-pay | Admitting: Emergency Medicine

## 2014-03-06 NOTE — Progress Notes (Signed)
Patient aware.  Advised to take Norvasc 1/2 BID and call with results.  Patient aware that she needs to go to ER if any increase in symptoms.  Patient states she feels good today and BP is much better this morning. Advised to continue to monitor BP and call with concerns.

## 2014-03-12 ENCOUNTER — Telehealth: Payer: Self-pay | Admitting: Internal Medicine

## 2014-03-12 NOTE — Telephone Encounter (Signed)
Dr. Annamaria Boots please advise if you have ONO on pt? thanks

## 2014-03-12 NOTE — Telephone Encounter (Signed)
Results were given to me by CY this morning-she is aware that she needs to continue using her O2 at night. She uses [facility] and will contact them about cheaper tanks.

## 2014-03-15 ENCOUNTER — Telehealth: Payer: Self-pay | Admitting: *Deleted

## 2014-03-15 NOTE — Telephone Encounter (Signed)
Received written note by front staff stating patient called with concerns about elevated BP readings.  Per Kelby Aline, PA-C orders, I advised patient to increase her Norvasc 5 mg to 10 mg daily.  Patient was taking 1/2 pill of 5 mg in the morning and 1/2 pill in the evening.  Melissa suggests she increases to 1 whole 5 mg pill in the morning and 1 whole pill in the evening.  Advised patient to call with results of BP readings next week.

## 2014-03-21 ENCOUNTER — Telehealth: Payer: Self-pay | Admitting: Internal Medicine

## 2014-03-21 NOTE — Telephone Encounter (Signed)
lmomtcb x1 for pt 

## 2014-03-22 NOTE — Telephone Encounter (Signed)
lmomtcb x2

## 2014-03-27 NOTE — Telephone Encounter (Signed)
Called spoke with patient who asks 2 questions: (1) can an order be sent to [facility] to have them pick up her O2 canisters?  She reports they are "in her way."  Did advise pt that these are her backup O2 supply in case her power goes out.  (2) pt will be leaving to go out of town from 7/9-7/12 to [facility] and will be riding with 3 children.  She does not want to "bother" with taking her stationary concentrator and would like to know if CY will okay for her to travel without this.    Dr Annamaria Boots please advise, thank you.

## 2014-03-27 NOTE — Telephone Encounter (Signed)
Back up cannisters are a safety measure in case power goes out.  She should talk with DME about renting a portable O2 concentrator for her travel

## 2014-03-27 NOTE — Telephone Encounter (Signed)
Spoke with pt States that she is not going to bother with trying to get POC at this time. States that she just wants to take her chances with not taking Oxygen with on her trip. I advised pt that per the recommendation of Dr Annamaria Boots, she needs to contact the DME to ask about renting the POC Pt aware of the importance of keeping O2 levels at rec levels as dropping too low can be very harmful. Pt states that she will think about it and may contact DME.  Will send to CY as FYI

## 2014-04-02 ENCOUNTER — Ambulatory Visit: Payer: Self-pay

## 2014-04-03 ENCOUNTER — Other Ambulatory Visit: Payer: Self-pay | Admitting: Emergency Medicine

## 2014-04-03 ENCOUNTER — Ambulatory Visit (INDEPENDENT_AMBULATORY_CARE_PROVIDER_SITE_OTHER): Payer: Medicare Other | Admitting: *Deleted

## 2014-04-03 VITALS — BP 162/70 | HR 74

## 2014-04-03 DIAGNOSIS — R6889 Other general symptoms and signs: Secondary | ICD-10-CM

## 2014-04-03 DIAGNOSIS — I10 Essential (primary) hypertension: Secondary | ICD-10-CM

## 2014-04-03 NOTE — Progress Notes (Signed)
Patient ID: Andrea [name], female   DOB: Apr 24, 1929, 80 y.o.   MRN: [id number] Patient presents today for 1 month recheck BP and BMP.  Per Kelby Aline, PA-C patient was advised per her orders: Start 1/2 Norvasc daily with BP 160/80s , w/c in 1 week with results. May consider changing Atenolol to Verapamil

## 2014-04-04 LAB — BASIC METABOLIC PANEL WITH GFR
BUN: 21 mg/dL (ref 6–23)
CALCIUM: 9.8 mg/dL (ref 8.4–10.5)
CO2: 34 mEq/L — ABNORMAL HIGH (ref 19–32)
Chloride: 94 mEq/L — ABNORMAL LOW (ref 96–112)
Creat: 0.91 mg/dL (ref 0.50–1.10)
GFR, EST NON AFRICAN AMERICAN: 59 mL/min — AB
GFR, Est African American: 67 mL/min
Glucose, Bld: 122 mg/dL — ABNORMAL HIGH (ref 70–99)
Potassium: 3.7 mEq/L (ref 3.5–5.3)
Sodium: 137 mEq/L (ref 135–145)

## 2014-04-26 ENCOUNTER — Encounter: Payer: Self-pay | Admitting: Internal Medicine

## 2014-05-02 ENCOUNTER — Other Ambulatory Visit: Payer: Self-pay | Admitting: Physician Assistant

## 2014-05-02 NOTE — Assessment & Plan Note (Signed)
Without need for bronchodilators. She questions ongoing need for oxygen during sleep. Daytime saturation is excellent. Plan-overnight oximetry on room air. She will need PFT scheduled in future

## 2014-05-09 ENCOUNTER — Encounter: Payer: Self-pay | Admitting: Internal Medicine

## 2014-05-09 ENCOUNTER — Ambulatory Visit (INDEPENDENT_AMBULATORY_CARE_PROVIDER_SITE_OTHER): Payer: Medicare Other | Admitting: Internal Medicine

## 2014-05-09 VITALS — BP 172/78 | HR 68 | Temp 97.0°F | Resp 16 | Ht 63.0 in | Wt 96.0 lb

## 2014-05-09 DIAGNOSIS — E559 Vitamin D deficiency, unspecified: Secondary | ICD-10-CM

## 2014-05-09 DIAGNOSIS — E782 Mixed hyperlipidemia: Secondary | ICD-10-CM

## 2014-05-09 DIAGNOSIS — I10 Essential (primary) hypertension: Secondary | ICD-10-CM

## 2014-05-09 DIAGNOSIS — Z79899 Other long term (current) drug therapy: Secondary | ICD-10-CM

## 2014-05-09 DIAGNOSIS — R7309 Other abnormal glucose: Secondary | ICD-10-CM

## 2014-05-09 LAB — CBC WITH DIFFERENTIAL/PLATELET
BASOS ABS: 0.1 10*3/uL (ref 0.0–0.1)
BASOS PCT: 1 % (ref 0–1)
EOS PCT: 2 % (ref 0–5)
Eosinophils Absolute: 0.1 10*3/uL (ref 0.0–0.7)
HCT: 36.3 % (ref 36.0–46.0)
Hemoglobin: 11.9 g/dL — ABNORMAL LOW (ref 12.0–15.0)
LYMPHS PCT: 24 % (ref 12–46)
Lymphs Abs: 1.5 10*3/uL (ref 0.7–4.0)
MCH: 29.5 pg (ref 26.0–34.0)
MCHC: 32.8 g/dL (ref 30.0–36.0)
MCV: 90.1 fL (ref 78.0–100.0)
MONO ABS: 0.6 10*3/uL (ref 0.1–1.0)
Monocytes Relative: 10 % (ref 3–12)
Neutro Abs: 4 10*3/uL (ref 1.7–7.7)
Neutrophils Relative %: 63 % (ref 43–77)
PLATELETS: 243 10*3/uL (ref 150–400)
RBC: 4.03 MIL/uL (ref 3.87–5.11)
RDW: 12.5 % (ref 11.5–15.5)
WBC: 6.3 10*3/uL (ref 4.0–10.5)

## 2014-05-09 MED ORDER — TRIAMTERENE-HCTZ 75-50 MG PO TABS: 30.0000 | ORAL_TABLET | Freq: Every day | ORAL | Status: AC

## 2014-05-09 MED ORDER — MINOXIDIL 10 MG PO TABS: ORAL_TABLET | ORAL | Status: DC

## 2014-05-09 MED ORDER — ALPRAZOLAM 0.5 MG PO TABS: ORAL_TABLET | ORAL | Status: DC

## 2014-05-09 NOTE — Progress Notes (Signed)
Patient ID: Andrea [name], female   DOB: 10/08/1928, 80 y.o.   MRN: [id number]   This very nice 80 y.o.female presents for 3 month follow up with Hypertension, Hyperlipidemia, COPD,  Pre-Diabetes and Vitamin D Deficiency. Patient is followed closely by Dr Keturah Barre for her COPD and is on home O2.   Patient is treated for HTN & BP has not been controlled at home on multi drug therapy. Today's BP: 172/78 mmHg. Patient denies any cardiac type chest pain, palpitations, dyspnea/orthopnea/PND, dizziness, claudication, or dependent edema.   Hyperlipidemia is not controlled with diet & meds. Patient denies myalgias or other med SE's. Last Lipids were 01/23/2014: Cholesterol l 223*; HDL 97; LDL113*; Triglycerides 65 and these results are felt sufficient wrt her age, low BMI 17 and overall prognosis.   Also, the patient has history of PreDiabetes and patient denies any symptoms of reactive hypoglycemia, diabetic polys, paresthesias or visual blurring.  Last A1c was 6.0% on  01/23/2014.    Further, Patient has history of Vitamin D Deficiency and patient supplements vitamin D without any suspected side-effects. Last vitamin D was  59 on 01/23/2014.   Medication List   ALPRAZolam 0.5 MG tablet  Commonly known as:  XANAX  Take 1/2 to 1 tablet  2 or 3 x daily as directed for nerves or sleep     amLODipine 5 MG tablet  Commonly known as:  NORVASC  1/2 pill PRN if blood pressure is greater than 150/90     atenolol 100 MG tablet  Commonly known as:  TENORMIN  Take 1 tablet (100 mg total) by mouth daily.     cholecalciferol 1000 UNITS tablet  Commonly known as:  VITAMIN D  Take 1,000 Units by mouth daily.     levothyroxine 50 MCG tablet  Commonly known as:  SYNTHROID, LEVOTHROID  Take 1 tablet (50 mcg total) by mouth as directed. Take 0.5 tablets every day except Sunday.     lisinopril 20 MG tablet  Commonly known as:  PRINIVIL,ZESTRIL  Take 1 tablet (20 mg total) by mouth daily. For BP     losartan  100 MG tablet  Commonly known as:  COZAAR  TAKE ONE-HALF IN THE MORNING AND ONE-HALF AT NIGHT     magnesium gluconate 500 MG tablet  Commonly known as:  MAGONATE  Take 500 mg by mouth daily.     minoxidil 10 MG tablet  Commonly known as:  LONITEN  Take 1/2 to 1 tablet daily as directed for  BP     omeprazole 40 MG capsule  Commonly known as:  PRILOSEC  Take 1 capsule (40 mg total) by mouth daily.     triamterene-hydrochlorothiazide 75-50 MG per tablet  Commonly known as:  MAXZIDE  Take 30 tablets by mouth daily. Take 1/2 to 1 tablet daily As Directed for BP & fluid     vitamin C 500 MG tablet  Commonly known as:  ASCORBIC ACID  Take 500 mg by mouth daily.     Allergies  Allergen Reactions  . Sulfa Antibiotics Other (See Comments)    Unknown   PMHx:   Past Medical History  Diagnosis Date  . Palpitations   . HLD (hyperlipidemia)   . DJD (degenerative joint disease)   . Mild mitral regurgitation by prior echocardiogram   . Mild tricuspid regurgitation   . HTN (hypertension)    FHx:    Reviewed / unchanged SHx:    Reviewed / unchanged  Systems Review:  Constitutional:  Denies fever, chills, wt changes, headaches, insomnia, fatigue, night sweats, change in appetite. Eyes: Denies redness, blurred vision, diplopia, discharge, itchy, watery eyes.  ENT: Denies discharge, congestion, post nasal drip, epistaxis, sore throat, earache, hearing loss, dental pain, tinnitus, vertigo, sinus pain, snoring.  CV: Denies chest pain, palpitations, irregular heartbeat, syncope, dyspnea, diaphoresis, orthopnea, PND, claudication or edema. Respiratory: denies cough, dyspnea, DOE, pleurisy, hoarseness, laryngitis, wheezing.  Gastrointestinal: Denies dysphagia, odynophagia, heartburn, reflux, water brash, abdominal pain or cramps, nausea, vomiting, bloating, diarrhea, constipation, hematemesis, melena, hematochezia  or hemorrhoids. Genitourinary: Denies dysuria, frequency, urgency, nocturia,  hesitancy, discharge, hematuria or flank pain. Musculoskeletal: Denies arthralgias, myalgias, stiffness, jt. swelling, pain, limping or strain/sprain.  Skin: Denies pruritus, rash, hives, warts, acne, eczema or change in skin lesion(s). Neuro: No weakness, tremor, incoordination, spasms, paresthesia or pain. Psychiatric: Denies confusion, memory loss or sensory loss. Endo: Denies change in weight, skin or hair change.  Heme/Lymph: No excessive bleeding, bruising or enlarged lymph nodes.  Exam:  BP 172/78  Pulse 68  Temp(Src) 97 F (36.1 C) (Temporal)  Resp 16  Ht _0  (1.6 m)  Wt 96 lb (43.545 kg)  BMI 17.01 kg/m2  Appears well nourished and in no distress. Eyes: PERRLA, EOMs, conjunctiva no swelling or erythema. Sinuses: No frontal/maxillary tenderness ENT/Mouth: EAC's clear, TM's nl w/o erythema, bulging. Nares clear w/o erythema, swelling, exudates. Oropharynx clear without erythema or exudates. Oral hygiene is good. Tongue normal, non obstructing. Hearing intact.  Neck: Supple. Thyroid nl. Car 2+/2+ without bruits, nodes or JVD. Chest: Respirations nl with BS clear & equal w/o rales, rhonchi, wheezing or stridor.  Cor: Heart sounds normal w/ regular rate and rhythm without sig. murmurs, gallops, clicks, or rubs. Peripheral pulses normal and equal  without edema.  Abdomen: Soft & bowel sounds normal. Non-tender w/o guarding, rebound, hernias, masses, or organomegaly.  Lymphatics: Unremarkable.  Musculoskeletal: Full ROM all peripheral extremities, joint stability, 5/5 strength, and normal gait.  Skin: Warm, dry without exposed rashes, lesions or ecchymosis apparent.  Neuro: Cranial nerves intact, reflexes equal bilaterally. Sensory-motor testing grossly intact. Tendon reflexes grossly intact.  Pysch: Alert & oriented x 3. Insight and judgement nl & appropriate. No ideations.  Assessment and Plan:  1. Hypertension - Not controlled. New Rx Minoxidil 10 mg to start 1/2 tab = 5 mg  & continue monitor blood pressure at home and to call status reports. Otherwise continue diet/meds same.  2. Hyperlipidemia - Continue diet/meds, exercise,& lifestyle modifications. Continue monitor periodic cholesterol/liver & renal functions   3. Pre-Diabetes - Continue diet, exercise, lifestyle modifications. Monitor appropriate labs.  4. Vitamin D Deficiency - Continue supplementation.  5. COPD  Recommended regular exercise, BP monitoring, weight control, and discussed med and SE's. Recommended labs to assess and monitor clinical status. Further disposition pending results of labs.

## 2014-05-10 LAB — LIPID PANEL
CHOLESTEROL: 222 mg/dL — AB (ref 0–200)
HDL: 85 mg/dL (ref >39)
LDL Cholesterol: 121 mg/dL — ABNORMAL HIGH (ref 0–99)
TRIGLYCERIDES: 78 mg/dL (ref <150)
Total CHOL/HDL Ratio: 2.6 Ratio
VLDL: 16 mg/dL (ref 0–40)

## 2014-05-10 LAB — TSH: TSH: 3.042 u[IU]/mL (ref 0.350–4.500)

## 2014-05-10 LAB — BASIC METABOLIC PANEL WITH GFR
BUN: 20 mg/dL (ref 6–23)
CALCIUM: 9.8 mg/dL (ref 8.4–10.5)
CO2: 35 mEq/L — ABNORMAL HIGH (ref 19–32)
CREATININE: 1.01 mg/dL (ref 0.50–1.10)
Chloride: 92 mEq/L — ABNORMAL LOW (ref 96–112)
GFR, EST AFRICAN AMERICAN: 59 mL/min — AB
GFR, EST NON AFRICAN AMERICAN: 52 mL/min — AB
Glucose, Bld: 85 mg/dL (ref 70–99)
Potassium: 4.3 mEq/L (ref 3.5–5.3)
Sodium: 132 mEq/L — ABNORMAL LOW (ref 135–145)

## 2014-05-10 LAB — HEPATIC FUNCTION PANEL
ALBUMIN: 4.1 g/dL (ref 3.5–5.2)
ALT: 8 U/L (ref 0–35)
AST: 20 U/L (ref 0–37)
Alkaline Phosphatase: 75 U/L (ref 39–117)
BILIRUBIN TOTAL: 0.4 mg/dL (ref 0.2–1.2)
Bilirubin, Direct: 0.1 mg/dL (ref 0.0–0.3)
Indirect Bilirubin: 0.3 mg/dL (ref 0.2–1.2)
TOTAL PROTEIN: 7.4 g/dL (ref 6.0–8.3)

## 2014-05-10 LAB — MAGNESIUM: MAGNESIUM: 2 mg/dL (ref 1.5–2.5)

## 2014-05-10 LAB — VITAMIN D 25 HYDROXY (VIT D DEFICIENCY, FRACTURES): VIT D 25 HYDROXY: 55 ng/mL (ref 30–89)

## 2014-05-10 LAB — INSULIN, FASTING: INSULIN FASTING, SERUM: 3 u[IU]/mL (ref 3–28)

## 2014-05-10 LAB — HEMOGLOBIN A1C
Hgb A1c MFr Bld: 6.1 % — ABNORMAL HIGH (ref <5.7)
MEAN PLASMA GLUCOSE: 128 mg/dL — AB (ref <117)

## 2014-05-18 ENCOUNTER — Other Ambulatory Visit: Payer: Self-pay | Admitting: Internal Medicine

## 2014-05-27 ENCOUNTER — Telehealth: Payer: Self-pay | Admitting: *Deleted

## 2014-05-27 ENCOUNTER — Other Ambulatory Visit: Payer: Self-pay | Admitting: Internal Medicine

## 2014-05-27 NOTE — Telephone Encounter (Signed)
Patient called and says she hasswelling of feet,calves and legs but BP good.  Per Dr Melford Aase, take an occasional extra Maxide in the afternoon and elevate feet when she sits.  Patient aware.

## 2014-05-31 ENCOUNTER — Telehealth: Payer: Self-pay | Admitting: *Deleted

## 2014-05-31 NOTE — Telephone Encounter (Signed)
Patient called stating she was given an Rx for Minoxidil for BP and Rx causing increasing bilateral pedal edema.  Patient was advised 05/27/14 per Dr. Idell Pickles orders to take 1/2 pill Minoxidil if BP staying low and take an occasional extra Maxide pill in the evening and elevate legs.  Per Vicie Mutters, PA-C orders today patient was advised to cut Minoxidil into 1/4 a pill and continue to elevate legs and follow up for OV 06/04/2014 for further evaluation.

## 2014-06-04 ENCOUNTER — Ambulatory Visit (INDEPENDENT_AMBULATORY_CARE_PROVIDER_SITE_OTHER): Payer: Medicare Other | Admitting: Physician Assistant

## 2014-06-04 ENCOUNTER — Encounter: Payer: Self-pay | Admitting: Physician Assistant

## 2014-06-04 VITALS — BP 138/70 | HR 68 | Temp 98.6°F | Resp 16 | Ht 63.0 in | Wt 103.0 lb

## 2014-06-04 DIAGNOSIS — R0609 Other forms of dyspnea: Secondary | ICD-10-CM

## 2014-06-04 DIAGNOSIS — I10 Essential (primary) hypertension: Secondary | ICD-10-CM

## 2014-06-04 DIAGNOSIS — R609 Edema, unspecified: Secondary | ICD-10-CM

## 2014-06-04 DIAGNOSIS — R0989 Other specified symptoms and signs involving the circulatory and respiratory systems: Secondary | ICD-10-CM

## 2014-06-04 DIAGNOSIS — Z79899 Other long term (current) drug therapy: Secondary | ICD-10-CM

## 2014-06-04 DIAGNOSIS — R0600 Dyspnea, unspecified: Secondary | ICD-10-CM

## 2014-06-04 LAB — CBC WITH DIFFERENTIAL/PLATELET
Basophils Absolute: 0 10*3/uL (ref 0.0–0.1)
Basophils Relative: 0 % (ref 0–1)
EOS PCT: 3 % (ref 0–5)
Eosinophils Absolute: 0.2 10*3/uL (ref 0.0–0.7)
HEMATOCRIT: 34.2 % — AB (ref 36.0–46.0)
HEMOGLOBIN: 11.3 g/dL — AB (ref 12.0–15.0)
LYMPHS PCT: 22 % (ref 12–46)
Lymphs Abs: 1.5 10*3/uL (ref 0.7–4.0)
MCH: 29.5 pg (ref 26.0–34.0)
MCHC: 33 g/dL (ref 30.0–36.0)
MCV: 89.3 fL (ref 78.0–100.0)
MONOS PCT: 10 % (ref 3–12)
Monocytes Absolute: 0.7 10*3/uL (ref 0.1–1.0)
NEUTROS ABS: 4.6 10*3/uL (ref 1.7–7.7)
Neutrophils Relative %: 65 % (ref 43–77)
Platelets: 238 10*3/uL (ref 150–400)
RBC: 3.83 MIL/uL — ABNORMAL LOW (ref 3.87–5.11)
RDW: 13 % (ref 11.5–15.5)
WBC: 7 10*3/uL (ref 4.0–10.5)

## 2014-06-04 LAB — BASIC METABOLIC PANEL WITH GFR
BUN: 23 mg/dL (ref 6–23)
CO2: 33 mEq/L — ABNORMAL HIGH (ref 19–32)
Calcium: 9.9 mg/dL (ref 8.4–10.5)
Chloride: 95 mEq/L — ABNORMAL LOW (ref 96–112)
Creat: 1.16 mg/dL — ABNORMAL HIGH (ref 0.50–1.10)
GFR, Est African American: 50 mL/min — ABNORMAL LOW
GFR, Est Non African American: 44 mL/min — ABNORMAL LOW
GLUCOSE: 91 mg/dL (ref 70–99)
POTASSIUM: 4.5 meq/L (ref 3.5–5.3)
Sodium: 134 mEq/L — ABNORMAL LOW (ref 135–145)

## 2014-06-04 LAB — TSH: TSH: 5.035 u[IU]/mL — ABNORMAL HIGH (ref 0.350–4.500)

## 2014-06-04 LAB — HEPATIC FUNCTION PANEL
ALT: 9 U/L (ref 0–35)
AST: 22 U/L (ref 0–37)
Albumin: 4.4 g/dL (ref 3.5–5.2)
Alkaline Phosphatase: 62 U/L (ref 39–117)
BILIRUBIN INDIRECT: 0.5 mg/dL (ref 0.2–1.2)
Bilirubin, Direct: 0.1 mg/dL (ref 0.0–0.3)
Total Bilirubin: 0.6 mg/dL (ref 0.2–1.2)
Total Protein: 7.5 g/dL (ref 6.0–8.3)

## 2014-06-04 LAB — MAGNESIUM: Magnesium: 2.1 mg/dL (ref 1.5–2.5)

## 2014-06-04 NOTE — Progress Notes (Signed)
HPI 80 y.o.female presents for swelling. She was seen for a routine visit on 08/13 and her BP was elevated at 170's, she was put on minoxidil 71m  1/2 of a pill daily in addition to her Atenolol 1017m Lisinopril 2036mLosartan 100m81mily, Norvasc 5mg 46m if BP is above 150, Maxide 1 pill M,W,F and 1/2 pill the rest of the days. She started to have bilateral swelling, her weight is up 7 lbs in a month, she denies PND, orthopnea, cough, SOB, fatigue, she has had some thoracic/upper back pain since the swelling has started.   Wt Readings from Last 3 Encounters:  06/04/14 103 lb (46.72 kg)  05/09/14 96 lb (43.545 kg)  02/28/14 98 lb (44.453 kg)   BP Readings from Last 3 Encounters:  06/04/14 138/70  05/09/14 172/78  04/03/14 162/70   Lab Results  Component Value Date   CREATININE 1.01 05/09/2014   BUN 20 05/09/2014   NA 132* 05/09/2014   K 4.3 05/09/2014   CL 92* 05/09/2014   CO2 35* 05/09/2014     Past Medical History  Diagnosis Date  . Palpitations   . HLD (hyperlipidemia)   . DJD (degenerative joint disease)   . Mild mitral regurgitation by prior echocardiogram   . Mild tricuspid regurgitation   . HTN (hypertension)      Allergies  Allergen Reactions  . Sulfa Antibiotics Other (See Comments)    Unknown     Current Outpatient Prescriptions on File Prior to Visit  Medication Sig Dispense Refill  . ALPRAZolam (XANAX) 0.5 MG tablet Take 1/2 to 1 tablet  2 or 3 x daily as directed for nerves or sleep  90 tablet  3  . amLODipine (NORVASC) 5 MG tablet 1/2 pill PRN if blood pressure is greater than 150/90  30 tablet  3  . Ascorbic Acid (VITAMIN C) 500 MG tablet Take 500 mg by mouth daily.        . ateMarland Kitchenolol (TENORMIN) 100 MG tablet TAKE ONE (1) TABLET BY MOUTH EVERY DAY  90 tablet  99  . cholecalciferol (VITAMIN D) 1000 UNITS tablet Take 1,000 Units by mouth daily.       . levMarland Kitchenthyroxine (SYNTHROID, LEVOTHROID) 50 MCG tablet Take 1 tablet (50 mcg total) by mouth as directed. Take 0.5  tablets every day except Sunday.  90 tablet  1  . lisinopril (PRINIVIL,ZESTRIL) 20 MG tablet Take 1 tablet (20 mg total) by mouth daily. For BP  90 tablet  99  . losartan (COZAAR) 100 MG tablet TAKE ONE-HALF IN THE MORNING AND ONE-HALF AT NIGHT  90 tablet  2  . magnesium gluconate (MAGONATE) 500 MG tablet Take 500 mg by mouth daily.       . minoxidil (LONITEN) 10 MG tablet Take 1/2 to 1 tablet daily as directed for  BP  30 tablet  99  . omeprazole (PRILOSEC) 40 MG capsule Take 1 capsule (40 mg total) by mouth daily.  90 capsule  1  . triamterene-hydrochlorothiazide (MAXZIDE) 75-50 MG per tablet Take 30 tablets by mouth daily. Take 1/2 to 1 tablet daily As Directed for BP & fluid  30 tablet  99   No current facility-administered medications on file prior to visit.    ROS: all negative expect above.   Physical: Filed Weights   06/04/14 0929  Weight: 103 lb (46.72 kg)   BP 138/70  Pulse 68  Temp(Src) 98.6 F (37 C)  Resp 16  Ht _0  (1.6 m)  Wt  103 lb (46.72 kg)  BMI 18.25 kg/m2 General Appearance: Well nourished, in no apparent distress. Eyes: PERRLA, EOMs. Sinuses: No Frontal/maxillary tenderness ENT/Mouth: Ext aud canals clear, normal light reflex with TMs without erythema, bulging. Post pharynx without erythema, swelling, exudate.  Respiratory: CTAB, decrease breath sounds right base Cardio: RRR, prominent S2, no murmurs, rubs or gallops. Peripheral pulses brisk and equal bilaterally, with left leg 2-3 + edema, and right leg with 1-2+ edema, no erythema, tenderness.  Abdomen: Soft, with bowl sounds. Nontender, no guarding, rebound. Lymphatics: Non tender without lymphadenopathy.  Musculoskeletal: Full ROM all peripheral extremities, 5/5 strength, and normal gait. Skin: Warm, dry without rashes, lesions, ecchymosis.  Neuro: Cranial nerves intact, reflexes equal bilaterally. Normal muscle tone, no cerebellar symptoms.  Pysch: Awake and oriented X 3, normal affect, Insight and  Judgment appropriate.   Assessment and Plan: Edema- stop the minoxidil, take the amlodipine 1/2 tablet daily, get on 1 of the maxide daily for 3-5 days- will get [place] left leg rule out DVT, elevate legs TID, increase activity, increase water, decrease sodium intake.  Wear compression socks more routinely if available. Return to the office if no change with symptoms. Check CXR.  Abnormal breath sounds- get CXR, history of effusion before with increase weight, check BNP, CXR HTN- continue meds and add amlodipine 2.5 mg daily  Follow up 1 month

## 2014-06-04 NOTE — Patient Instructions (Addendum)
Stop Minoxidil Take the Maxzide daily for 3-5 days Elevate legs 2-3 x daily, try to wear compression stockings.  Take the amlodipine 1/2 pill DAILY, can take the other 1/2 at night IF NEEDED if your blood pressure is above 150/90   Edema Edema is an abnormal buildup of fluids in your bodytissues. Edema is somewhatdependent on gravity to pull the fluid to the lowest place in your body. That makes the condition more common in the legs and thighs (lower extremities). Painless swelling of the feet and ankles is common and becomes more likely as you get older. It is also common in looser tissues, like around your eyes.  When the affected area is squeezed, the fluid may move out of that spot and leave a dent for a few moments. This dent is called pitting.  CAUSES  There are many possible causes of edema. Eating too much salt and being on your feet or sitting for a long time can cause edema in your legs and ankles. Hot weather may make edema worse. Common medical causes of edema include:  Heart failure.  Liver disease.  Kidney disease.  Weak blood vessels in your legs.  Cancer.  An injury.  Pregnancy.  Some medications.  Obesity. SYMPTOMS  Edema is usually painless.Your skin may look swollen or shiny.  DIAGNOSIS  Your health care provider may be able to diagnose edema by asking about your medical history and doing a physical exam. You may need to have tests such as X-rays, an electrocardiogram, or blood tests to check for medical conditions that may cause edema.  TREATMENT  Edema treatment depends on the cause. If you have heart, liver, or kidney disease, you need the treatment appropriate for these conditions. General treatment may include:  Elevation of the affected body part above the level of your heart.  Compression of the affected body part. Pressure from elastic bandages or support stockings squeezes the tissues and forces fluid back into the blood vessels. This keeps fluid  from entering the tissues.  Restriction of fluid and salt intake.  Use of a water pill (diuretic). These medications are appropriate only for some types of edema. They pull fluid out of your body and make you urinate more often. This gets rid of fluid and reduces swelling, but diuretics can have side effects. Only use diuretics as directed by your health care provider. HOME CARE INSTRUCTIONS   Keep the affected body part above the level of your heart when you are lying down.   Do not sit still or stand for prolonged periods.   Do not put anything directly under your knees when lying down.  Do not wear constricting clothing or garters on your upper legs.   Exercise your legs to work the fluid back into your blood vessels. This may help the swelling go down.   Wear elastic bandages or support stockings to reduce ankle swelling as directed by your health care provider.   Eat a low-salt diet to reduce fluid if your health care provider recommends it.   Only take medicines as directed by your health care provider. SEEK MEDICAL CARE IF:   Your edema is not responding to treatment.  You have heart, liver, or kidney disease and notice symptoms of edema.  You have edema in your legs that does not improve after elevating them.   You have sudden and unexplained weight gain. SEEK IMMEDIATE MEDICAL CARE IF:   You develop shortness of breath or chest pain.   You  cannot breathe when you lie down.  You develop pain, redness, or warmth in the swollen areas.   You have heart, liver, or kidney disease and suddenly get edema.  You have a fever and your symptoms suddenly get worse. MAKE SURE YOU:   Understand these instructions.  Will watch your condition.  Will get help right away if you are not doing well or get worse. Document Released: 09/13/2005 Document Revised: 01/28/2014 Document Reviewed: 07/06/2013 [facility] Patient Information 2015 [place], [place]. This information is  not intended to replace advice given to you by your health care provider. Make sure you discuss any questions you have with your health care provider.

## 2014-06-05 LAB — BRAIN NATRIURETIC PEPTIDE: BRAIN NATRIURETIC PEPTIDE: 271.5 pg/mL — AB (ref 0.0–100.0)

## 2014-06-06 ENCOUNTER — Ambulatory Visit (HOSPITAL_COMMUNITY)
Admission: RE | Admit: 2014-06-06 | Discharge: 2014-06-06 | Disposition: A | Discharge status: Home / Self Care | Payer: Medicare Other | Source: Ambulatory Visit | Attending: Physician Assistant | Admitting: Physician Assistant

## 2014-06-06 DIAGNOSIS — J449 Chronic obstructive pulmonary disease, unspecified: Secondary | ICD-10-CM | POA: Insufficient documentation

## 2014-06-06 DIAGNOSIS — I509 Heart failure, unspecified: Secondary | ICD-10-CM | POA: Diagnosis present

## 2014-06-06 DIAGNOSIS — J90 Pleural effusion, not elsewhere classified: Secondary | ICD-10-CM | POA: Diagnosis not present

## 2014-06-06 DIAGNOSIS — J4489 Other specified chronic obstructive pulmonary disease: Secondary | ICD-10-CM | POA: Insufficient documentation

## 2014-06-06 DIAGNOSIS — R0600 Dyspnea, unspecified: Secondary | ICD-10-CM

## 2014-06-26 ENCOUNTER — Encounter: Payer: Self-pay | Admitting: Physician Assistant

## 2014-07-04 ENCOUNTER — Encounter: Payer: Self-pay | Admitting: Internal Medicine

## 2014-07-04 ENCOUNTER — Ambulatory Visit (INDEPENDENT_AMBULATORY_CARE_PROVIDER_SITE_OTHER): Payer: Medicare Other | Admitting: Internal Medicine

## 2014-07-04 VITALS — BP 110/76 | HR 83 | Ht 63.0 in | Wt 98.8 lb

## 2014-07-04 DIAGNOSIS — J441 Chronic obstructive pulmonary disease with (acute) exacerbation: Secondary | ICD-10-CM

## 2014-07-04 DIAGNOSIS — Z23 Encounter for immunization: Secondary | ICD-10-CM

## 2014-07-04 NOTE — Patient Instructions (Addendum)
Flu vax  Order- future CXR dx COPD with emphysema, pleural effusion Left     Gt be done at next ov.  Order- [place] on room air

## 2014-07-04 NOTE — Progress Notes (Signed)
12/18/13- 33 yoF former smoker [name]; sent back by [facility] Dr Melford Aase.   For reassessment of COPD  History of lung nodules which were stable at last visit 05/08/2010 with CT chest 05/12/2010. Known emphysema and stable subcentimeter bilateral pulmonary nodules. Several episodes of pneumonia. Has used oxygen for sleep. Paces herself during the day and denies cough while recognizing dyspnea on exertion with brisk walking. History of mild valvular heart disease, hypertension, irregular heartbeat. No history of TB. Lives with husband CXR 10/01/13 IMPRESSION:  Emphysema with airspace disease in the right base suggesting  pneumonia.  Electronically Signed  By: Misty Stanley M.D.  On: 10/01/2013 12:49  02/28/14-  80 yoF former smoker followed for COPD, hx lung nodules,  complicated by HBP, mild valvular heart disease, hypertension, irregular heartbeat FOLLOWS FOR: Pt states breathing has improved since last visit. Pt states she has an increase in energy. C/o mild SOB with activity. C/o dry cough at night. Denies CP/tightness.  Postnasal drip. Avoids antihistamines which overdry her eyes. Uses no respiratory meds. Sleeps with oxygen 2 L started by hospital-she questions need. Walks treadmill at gym  +will need PFT+  07/04/14- 84 yoF former smoker followed for COPD, hx lung nodules,  complicated by HBP, mild valvular heart disease, hypertension, irregular heartbeat FOLLOW FOR: Pulmonary Emphysema; sometimes gets tickle in throat, but no cough, no chest tightness, no complaints O2 2L/ [facility]    CXR 06/06/14 IMPRESSION:  1. Small left pleural effusion.  2. COPD.  Electronically Signed  By: Kathreen Devoid  On: 06/06/2014 15:06   ROS-see HPI Constitutional:   No-   weight loss, night sweats, fevers, chills, fatigue, lassitude. HEENT:   No-  headaches, difficulty swallowing, tooth/dental problems, sore throat,       No-  sneezing, itching, ear ache, nasal congestion,  post nasal drip,  CV:  No-   chest pain, orthopnea, PND, swelling in lower extremities, anasarca,                                  dizziness, palpitations Resp: +shortness of breath with exertion or at rest.              No-   productive cough,  No non-productive cough,  No- coughing up of blood.              No-   change in color of mucus.  No- wheezing.   Skin: No-   rash or lesions. GI:  No-   heartburn, indigestion, abdominal pain, nausea, vomiting,  GU:  MS:  No-   joint pain or swelling.  . Neuro-     nothing unusual Psych:  No- change in mood or affect. No depression or anxiety.  No memory loss.  OBJ- Physical Exam General- Alert, Oriented, Affect-appropriate, Distress- none acute. Lean Skin- rash-none, lesions- none, excoriation- none Lymphadenopathy- none Head- atraumatic            Eyes- Gross vision intact, PERRLA, conjunctivae and secretions clear            Ears- +cerumen            Nose- Clear, no-Septal dev, mucus, polyps, erosion, perforation             Throat- Mallampati II , mucosa clear , drainage- none, tonsils- atrophic. Own teeth Neck- flexible , trachea midline, no stridor , thyroid nl, carotid no bruit Chest - symmetrical excursion ,  unlabored           Heart/CV- RRR , no murmur , no gallop  , no rub, nl s1 s2                           - JVD- none , edema- none, stasis changes- none, varices- none           Lung- clear to P&A, wheeze- none, cough- none , dullness-none, rub- none           Chest wall-  Abd-  Br/ Gen/ Rectal- Not done, not indicated Extrem- cyanosis- none, clubbing, none, atrophy- none, strength- nl Neuro- grossly intact to observation

## 2014-07-09 ENCOUNTER — Ambulatory Visit: Payer: Self-pay | Admitting: Physician Assistant

## 2014-07-31 DIAGNOSIS — Z961 Presence of intraocular lens: Secondary | ICD-10-CM | POA: Insufficient documentation

## 2014-08-07 ENCOUNTER — Ambulatory Visit (INDEPENDENT_AMBULATORY_CARE_PROVIDER_SITE_OTHER): Payer: Medicare Other | Admitting: Physician Assistant

## 2014-08-07 ENCOUNTER — Encounter: Payer: Self-pay | Admitting: Physician Assistant

## 2014-08-07 VITALS — BP 176/80 | HR 70 | Temp 97.8°F | Resp 16 | Ht 63.0 in | Wt 97.0 lb

## 2014-08-07 DIAGNOSIS — B029 Zoster without complications: Secondary | ICD-10-CM

## 2014-08-07 MED ORDER — PREDNISONE 20 MG PO TABS: ORAL_TABLET | ORAL | Status: AC

## 2014-08-07 MED ORDER — PREGABALIN 50 MG PO CAPS: 50.0000 mg | ORAL_CAPSULE | Freq: Three times a day (TID) | ORAL | Status: DC

## 2014-08-07 MED ORDER — PREGABALIN 50 MG PO CAPS: 50.0000 mg | ORAL_CAPSULE | Freq: Two times a day (BID) | ORAL | Status: DC

## 2014-08-07 NOTE — Patient Instructions (Addendum)
-  Take the Prednisone as prescribed.  Please be careful of what you eat, due to prednisone can cause weight gain.   -Take the Lyrica as prescribed.  Start taking one capsule a day for nerve pain if needed.  Do not exceed the one capsule a day.  If you need a refill, then please call office.  Please go to the eye doctor as soon as possible to get your left eye checked out to make sure it is not infecting your eye.    If you are not feeling better in 10-14 days, then please call the office.  Shingles Shingles is caused by the same virus that causes chickenpox. The first feelings may be pain or tingling. A rash will follow in a couple days. The rash may occur on any area of the body. Long-lasting pain is more likely in an elderly person. It can last months to years. There are medicines that can help prevent pain if you start taking them early. HOME CARE   Take cool baths or place cool cloths on the rash as told by your doctor.  Take medicine only as told by your doctor.  Rest as told by your doctor.  Keep your rash clean with mild soap and cool water or as told by your doctor.  Do not scratch your rash. You may use calamine lotion to relieve itchy skin as told by your doctor.  Keep your rash covered with a loose bandage (dressing).  Avoid touching:  Babies.  Pregnant women.  Children with inflamed skin (eczema).  People who have gotten organ transplants.  People with chronic illnesses, such as leukemia or AIDS.  Wear loose-fitting clothing.  If the rash is on the face, you may need to see a specialist. Keep all appointments. Shingles must be kept away from the eyes, if possible.  Keep all follow-up visits as told by your doctor. GET HELP RIGHT AWAY IF:   You have any pain on the face or eye.  You lose feeling on one side of your face.  You have ear pain or ringing in your ear.  You cannot taste as well.  Your medicines do not help the pain.  Your redness or puffiness  (swelling) spreads.  You feel like you are getting worse.  You have a fever. MAKE SURE YOU:   Understand these instructions.  Will watch your condition.  Will get help right away if you are not doing well or get worse. Document Released: 03/01/2008 Document Revised: 01/28/2014 Document Reviewed: 03/01/2008 [facility] Patient Information 2015 [place], [place]. This information is not intended to replace advice given to you by your health care provider. Make sure you discuss any questions you have with your health care provider.

## 2014-08-07 NOTE — Progress Notes (Signed)
Subjective:    Patient ID: Andrea [name], female    DOB: [birth year], 80 y.o.   MRN: [id number]  Rash This is a new problem. Episode onset: Last thursday or friday. The problem has been gradually improving since onset. The affected locations include the face (Started above lip on left side and progressed to left maxillary area and is around eye and on left temple area.). The rash is characterized by itchiness (States it is painful sometimes.). She was exposed to nothing. Associated symptoms include facial edema. Pertinent negatives include no anorexia, congestion, cough, diarrhea, eye pain, fatigue, fever, joint pain, nail changes, rhinorrhea, shortness of breath, sore throat or vomiting. (Patient states her daughter is a Copywriter, advertising and she keep an eye on any dental problems.  Patient denies any current dental issues.  Patient states her face feels swollen and sometimes painful.) Past treatments include nothing. The treatment provided mild relief. Her past medical history is significant for varicella. (States she had chicken pox when she was younger.  Denies having the shingles vaccine due to high cost about $200.  She states her husband did get the shingles vaccine around the same time she was going to get it.)   Current GFR is 44 as of 06/04/14 and Creatinine of 1.16 on 06/04/14.  Review of Systems  Constitutional: Negative.  Negative for fever, chills, diaphoresis and fatigue.  HENT: Negative.  Negative for congestion, rhinorrhea, sore throat and trouble swallowing.        Stuffy ear on left side and some pain along eustachian tube.  Eyes: Negative.  Negative for pain.  Respiratory: Negative.  Negative for cough and shortness of breath.   Cardiovascular: Negative.   Gastrointestinal: Negative.  Negative for vomiting, diarrhea and anorexia.  Genitourinary: Negative.   Musculoskeletal: Negative.  Negative for joint pain.  Skin: Positive for rash. Negative for nail changes.  Neurological:  Negative.  Negative for dizziness, speech difficulty, weakness, light-headedness and headaches.  Psychiatric/Behavioral: Negative.    Past Medical History  Diagnosis Date  . Palpitations   . HLD (hyperlipidemia)   . DJD (degenerative joint disease)   . Mild mitral regurgitation by prior echocardiogram   . Mild tricuspid regurgitation   . HTN (hypertension)    Current Outpatient Prescriptions on File Prior to Visit  Medication Sig Dispense Refill  . ALPRAZolam (XANAX) 0.5 MG tablet Take 1/2 to 1 tablet  2 or 3 x daily as directed for nerves or sleep 90 tablet 3  . amLODipine (NORVASC) 5 MG tablet 1/2 pill PRN if blood pressure is greater than 150/90 30 tablet 3  . atenolol (TENORMIN) 100 MG tablet TAKE ONE (1) TABLET BY MOUTH EVERY DAY 90 tablet 99  . levothyroxine (SYNTHROID, LEVOTHROID) 50 MCG tablet Take 1 tablet (50 mcg total) by mouth as directed. Take 0.5 tablets every day except Sunday. (Patient taking differently: Take 50 mcg by mouth as directed. Take 1/2 pill times 5 days and 1 pill Fri. and Sun.) 90 tablet 1  . lisinopril (PRINIVIL,ZESTRIL) 20 MG tablet Take 1 tablet (20 mg total) by mouth daily. For BP 90 tablet 99  . losartan (COZAAR) 100 MG tablet TAKE ONE-HALF IN THE MORNING AND ONE-HALF AT NIGHT 90 tablet 2  . magnesium gluconate (MAGONATE) 500 MG tablet Take 500 mg by mouth daily.     [name] omeprazole (PRILOSEC) 40 MG capsule Take 1 capsule (40 mg total) by mouth daily. 90 capsule 1  . triamterene-hydrochlorothiazide (MAXZIDE) 75-50 MG per tablet Take 30 tablets  by mouth daily. Take 1/2 to 1 tablet daily As Directed for BP & fluid 30 tablet 99  . Ascorbic Acid (VITAMIN C) 500 MG tablet Take 500 mg by mouth daily.      . cholecalciferol (VITAMIN D) 1000 UNITS tablet Take 1,000 Units by mouth daily.      No current facility-administered medications on file prior to visit.   Allergies  Allergen Reactions  . Sulfa Antibiotics Other (See Comments)    Unknown     BP 176/80 mmHg   Pulse 70  Temp(Src) 97.8 F (36.6 C) (Temporal)  Resp 16  Ht _0  (1.6 m)  Wt 97 lb (43.999 kg)  BMI 17.19 kg/m2  SpO2 95% Wt Readings from Last 3 Encounters:  08/07/14 97 lb (43.999 kg)  07/04/14 98 lb 12.8 oz (44.815 kg)  06/04/14 103 lb (46.72 kg)   Objective:   Physical Exam  Constitutional: She is oriented to person, place, and time. She appears well-developed and well-nourished. She does not have a sickly appearance. No distress.  HENT:  Head: Normocephalic and atraumatic.  Right Ear: Tympanic membrane, external ear and ear canal normal. No drainage or swelling. Tympanic membrane is not injected, not scarred, not perforated, not erythematous, not retracted and not bulging.  Left Ear: Tympanic membrane, external ear and ear canal normal. No drainage or swelling. Tympanic membrane is not injected, not scarred, not perforated, not erythematous, not retracted and not bulging.  Nose: Nose normal. Right sinus exhibits no maxillary sinus tenderness and no frontal sinus tenderness. Left sinus exhibits no maxillary sinus tenderness and no frontal sinus tenderness.  Mouth/Throat: Uvula is midline, oropharynx is clear and moist and mucous membranes are normal. Mucous membranes are not pale and not dry. No oral lesions. No trismus in the jaw. No dental abscesses or uvula swelling. No oropharyngeal exudate, posterior oropharyngeal edema, posterior oropharyngeal erythema or tonsillar abscesses.  Left ear canal has cerumen, but could still see TM.  Eyes: Conjunctivae, EOM and lids are normal. Pupils are equal, round, and reactive to light. Right eye exhibits no discharge and no exudate. Left eye exhibits no discharge and no exudate. No scleral icterus.  Neck: Trachea normal, normal range of motion and phonation normal. Neck supple. No tracheal tenderness present. No tracheal deviation present.  Cardiovascular: Normal rate, regular rhythm, S1 normal, S2 normal, normal heart sounds and normal  pulses.  Exam reveals no gallop, no distant heart sounds and no friction rub.   No murmur heard. Pulmonary/Chest: Effort normal and breath sounds normal. No stridor. No respiratory distress. She has no decreased breath sounds. She has no wheezes. She has no rhonchi. She has no rales. She exhibits no tenderness.  Abdominal: Soft. Bowel sounds are normal. There is no tenderness. There is no rebound and no guarding.  Musculoskeletal: Normal range of motion.  Lymphadenopathy:    She has no cervical adenopathy.  No LAD.  Neurological: She is alert and oriented to person, place, and time. She has normal strength. No cranial nerve deficit or sensory deficit. She displays a negative Romberg sign. Coordination and gait normal.  Patient is unable to smile on left side of face.  Skin: Skin is warm, dry and intact. Rash noted. Rash is macular. Rash is not pustular and not vesicular. She is not diaphoretic.     Psychiatric: She has a normal mood and affect. Her speech is normal and behavior is normal. Judgment and thought content normal. Cognition and memory are normal.  Vitals reviewed.  Assessment & Plan:  1. Shingles -Take prednisone as prescribed for inflammation- predniSONE (DELTASONE) 20 MG tablet; Take 3 tablets PO QDaily for 3 days, then take 2 tablets PO QDaily for 3 days, then take 1 tablet PO QDaily for 3 days  Dispense: 18 tablet; Refill: 0 -Take Lyrica 50m- start by taking 1 capsule by mouth daily for nerve pain.  You can take 2 capsules per day.-( pregabalin (LYRICA) 50 MG capsule; Take 1 capsule (50 mg total) by mouth 2 (two) times daily. Gave pt sample on 08/07/14.  Dispense: 21 capsule; Refill: 0.  Restricted dosing due to GFR at 44 and Creatinine at 1.16.  Please go see the eye doctor as soon as possible (this week).  The eye doctor has the equipment to look into your left eye and make sure the shingle's virus is not in your eye.  If it is in your left eye, then he will need to  prescribe some eye medication.  It is important to get this checked out to avoid complications like painful eye infections, and in some cases, immediate or delayed vision impairment.  Told patient to use mineral oil or OTC Debrox to help loosen cerumen in left ear.  Then to use bulb with warm water to help flush it out.  Avoid Q-Tips as it can push it in further.  Discussed medication effects and SE's.  Pt agreed to treatment plan.  If you are not feeling better in 10-14 days, then please call the office.   Jennika Ringgold, JStephani Police PA-C 3:30 PM [facility]

## 2014-08-20 ENCOUNTER — Encounter: Payer: Self-pay | Admitting: Physician Assistant

## 2014-08-20 ENCOUNTER — Ambulatory Visit (INDEPENDENT_AMBULATORY_CARE_PROVIDER_SITE_OTHER): Payer: Medicare Other | Admitting: Physician Assistant

## 2014-08-20 VITALS — BP 138/70 | HR 72 | Temp 98.0°F | Resp 16 | Ht 63.0 in | Wt 97.0 lb

## 2014-08-20 DIAGNOSIS — Z0001 Encounter for general adult medical examination with abnormal findings: Secondary | ICD-10-CM

## 2014-08-20 DIAGNOSIS — R197 Diarrhea, unspecified: Secondary | ICD-10-CM

## 2014-08-20 DIAGNOSIS — Z79899 Other long term (current) drug therapy: Secondary | ICD-10-CM

## 2014-08-20 DIAGNOSIS — E876 Hypokalemia: Secondary | ICD-10-CM

## 2014-08-20 DIAGNOSIS — M19019 Primary osteoarthritis, unspecified shoulder: Secondary | ICD-10-CM

## 2014-08-20 DIAGNOSIS — J439 Emphysema, unspecified: Secondary | ICD-10-CM

## 2014-08-20 DIAGNOSIS — R7309 Other abnormal glucose: Secondary | ICD-10-CM

## 2014-08-20 DIAGNOSIS — I10 Essential (primary) hypertension: Secondary | ICD-10-CM

## 2014-08-20 DIAGNOSIS — E441 Mild protein-calorie malnutrition: Secondary | ICD-10-CM

## 2014-08-20 DIAGNOSIS — E871 Hypo-osmolality and hyponatremia: Secondary | ICD-10-CM

## 2014-08-20 DIAGNOSIS — R7303 Prediabetes: Secondary | ICD-10-CM

## 2014-08-20 DIAGNOSIS — R1084 Generalized abdominal pain: Secondary | ICD-10-CM

## 2014-08-20 DIAGNOSIS — E559 Vitamin D deficiency, unspecified: Secondary | ICD-10-CM

## 2014-08-20 DIAGNOSIS — I340 Nonrheumatic mitral (valve) insufficiency: Secondary | ICD-10-CM

## 2014-08-20 DIAGNOSIS — E039 Hypothyroidism, unspecified: Secondary | ICD-10-CM

## 2014-08-20 DIAGNOSIS — R6889 Other general symptoms and signs: Secondary | ICD-10-CM

## 2014-08-20 DIAGNOSIS — E782 Mixed hyperlipidemia: Secondary | ICD-10-CM

## 2014-08-20 DIAGNOSIS — R918 Other nonspecific abnormal finding of lung field: Secondary | ICD-10-CM

## 2014-08-20 LAB — CBC WITH DIFFERENTIAL/PLATELET
BASOS ABS: 0 10*3/uL (ref 0.0–0.1)
Basophils Relative: 0 % (ref 0–1)
Eosinophils Absolute: 0 10*3/uL (ref 0.0–0.7)
Eosinophils Relative: 0 % (ref 0–5)
HCT: 39.1 % (ref 36.0–46.0)
Hemoglobin: 13 g/dL (ref 12.0–15.0)
LYMPHS ABS: 1.4 10*3/uL (ref 0.7–4.0)
Lymphocytes Relative: 11 % — ABNORMAL LOW (ref 12–46)
MCH: 30 pg (ref 26.0–34.0)
MCHC: 33.2 g/dL (ref 30.0–36.0)
MCV: 90.1 fL (ref 78.0–100.0)
MPV: 10.4 fL (ref 9.4–12.4)
Monocytes Absolute: 0.9 10*3/uL (ref 0.1–1.0)
Monocytes Relative: 7 % (ref 3–12)
NEUTROS ABS: 10.6 10*3/uL — AB (ref 1.7–7.7)
Neutrophils Relative %: 82 % — ABNORMAL HIGH (ref 43–77)
PLATELETS: 264 10*3/uL (ref 150–400)
RBC: 4.34 MIL/uL (ref 3.87–5.11)
RDW: 13.6 % (ref 11.5–15.5)
WBC: 12.9 10*3/uL — ABNORMAL HIGH (ref 4.0–10.5)

## 2014-08-20 LAB — AMYLASE: Amylase: 64 U/L (ref 0–105)

## 2014-08-20 NOTE — Patient Instructions (Addendum)
Stop the magnesium for now since this can cause diarrhea Start a probiotic to see if this helps with the diarrhea and avoid milk/dairy products. Take the samples of the stomach medication given and start zantac after that for 2 weeks.  If it continues you may need to contact Dr. Deatra Ina for a follow up appointment.   Preventative Care for Adults - Female      MAINTAIN REGULAR HEALTH EXAMS:  A routine yearly physical is a good way to check in with your primary care provider about your health and preventive screening. It is also an opportunity to share updates about your health and any concerns you have, and receive a thorough all-over exam.   Most health insurance companies pay for at least some preventative services.  Check with your health plan for specific coverages.  WHAT PREVENTATIVE SERVICES DO WOMEN NEED?  Adult women should have their weight and blood pressure checked regularly.   Women age 57 and older should have their cholesterol levels checked regularly.  Women should be screened for cervical cancer with a Pap smear and pelvic exam beginning at either age 33, or 3 years after they become sexually activity.    Breast cancer screening generally begins at age 10 with a mammogram and breast exam by your primary care provider.    Beginning at age 9 and continuing to age 9, women should be screened for colorectal cancer.  Certain people may need continued testing until age 60.  Updating vaccinations is part of preventative care.  Vaccinations help protect against diseases such as the flu.  Osteoporosis is a disease in which the bones lose minerals and strength as we age. Women ages 61 and over should discuss this with their caregivers, as should women after menopause who have other risk factors.  Lab tests are generally done as part of preventative care to screen for anemia and blood disorders, to screen for problems with the kidneys and liver, to screen for bladder problems, to  check blood sugar, and to check your cholesterol level.  Preventative services generally include counseling about diet, exercise, avoiding tobacco, drugs, excessive alcohol consumption, and sexually transmitted infections.    GENERAL RECOMMENDATIONS FOR GOOD HEALTH:  Healthy diet:  Eat a variety of foods, including fruit, vegetables, animal or vegetable protein, such as meat, fish, chicken, and eggs, or beans, lentils, tofu, and grains, such as rice.  Drink plenty of water daily.  Decrease saturated fat in the diet, avoid lots of red meat, processed foods, sweets, fast foods, and fried foods.  Exercise:  Aerobic exercise helps maintain good heart health. At least 30-40 minutes of moderate-intensity exercise is recommended. For example, a brisk walk that increases your heart rate and breathing. This should be done on most days of the week.   Find a type of exercise or a variety of exercises that you enjoy so that it becomes a part of your daily life.  Examples are running, walking, swimming, water aerobics, and biking.  For motivation and support, explore group exercise such as aerobic class, spin class, Zumba, Yoga,or  martial arts, etc.    Set exercise goals for yourself, such as a certain weight goal, walk or run in a race such as a 5k walk/run.  Speak to your primary care provider about exercise goals.  Disease prevention:  If you smoke or chew tobacco, find out from your caregiver how to quit. It can literally save your life, no matter how long you have been a tobacco  user. If you do not use tobacco, never begin.   Maintain a healthy diet and normal weight. Increased weight leads to problems with blood pressure and diabetes.   The Body Mass Index or BMI is a way of measuring how much of your body is fat. Having a BMI above 27 increases the risk of heart disease, diabetes, hypertension, stroke and other problems related to obesity. Your caregiver can help determine your BMI and based  on it develop an exercise and dietary program to help you achieve or maintain this important measurement at a healthful level.  High blood pressure causes heart and blood vessel problems.  Persistent high blood pressure should be treated with medicine if weight loss and exercise do not work.   Fat and cholesterol leaves deposits in your arteries that can block them. This causes heart disease and vessel disease elsewhere in your body.  If your cholesterol is found to be high, or if you have heart disease or certain other medical conditions, then you may need to have your cholesterol monitored frequently and be treated with medication.   Ask if you should have a cardiac stress test if your history suggests this. A stress test is a test done on a treadmill that looks for heart disease. This test can find disease prior to there being a problem.  Menopause can be associated with physical symptoms and risks. Hormone replacement therapy is available to decrease these. You should talk to your caregiver about whether starting or continuing to take hormones is right for you.   Osteoporosis is a disease in which the bones lose minerals and strength as we age. This can result in serious bone fractures. Risk of osteoporosis can be identified using a bone density scan. Women ages 74 and over should discuss this with their caregivers, as should women after menopause who have other risk factors. Ask your caregiver whether you should be taking a calcium supplement and Vitamin D, to reduce the rate of osteoporosis.   Avoid drinking alcohol in excess (more than two drinks per day).  Avoid use of street drugs. Do not share needles with anyone. Ask for professional help if you need assistance or instructions on stopping the use of alcohol, cigarettes, and/or drugs.  Brush your teeth twice a day with fluoride toothpaste, and floss once a day. Good oral hygiene prevents tooth decay and gum disease. The problems can be  painful, unattractive, and can cause other health problems. Visit your dentist for a routine oral and dental check up and preventive care every 6-12 months.   Look at your skin regularly.  Use a mirror to look at your back. Notify your caregivers of changes in moles, especially if there are changes in shapes, colors, a size larger than a pencil eraser, an irregular border, or development of new moles.  Safety:  Use seatbelts 100% of the time, whether driving or as a passenger.  Use safety devices such as hearing protection if you work in environments with loud noise or significant background noise.  Use safety glasses when doing any work that could send debris in to the eyes.  Use a helmet if you ride a bike or motorcycle.  Use appropriate safety gear for contact sports.  Talk to your caregiver about gun safety.  Use sunscreen with a SPF (or skin protection factor) of 15 or greater.  Lighter skinned people are at a greater risk of skin cancer. Don't forget to also wear sunglasses in order to protect your  eyes from too much damaging sunlight. Damaging sunlight can accelerate cataract formation.   Practice safe sex. Use condoms. Condoms are used for birth control and to help reduce the spread of sexually transmitted infections (or STIs).  Some of the STIs are gonorrhea (the clap), chlamydia, syphilis, trichomonas, herpes, HPV (human papilloma virus) and HIV (human immunodeficiency virus) which causes AIDS. The herpes, HIV and HPV are viral illnesses that have no cure. These can result in disability, cancer and death.   Keep carbon monoxide and smoke detectors in your home functioning at all times. Change the batteries every 6 months or use a model that plugs into the wall.   Vaccinations:  Stay up to date with your tetanus shots and other required immunizations. You should have a booster for tetanus every 10 years. Be sure to get your flu shot every year, since 5%-20% of the [place] population comes down  with the flu. The flu vaccine changes each year, so being vaccinated once is not enough. Get your shot in the fall, before the flu season peaks.   Other vaccines to consider:  Human Papilloma Virus or HPV causes cancer of the cervix, and other infections that can be transmitted from person to person. There is a vaccine for HPV, and females should get immunized between the ages of 68 and 45. It requires a series of 3 shots.   Pneumococcal vaccine to protect against certain types of pneumonia.  This is normally recommended for adults age 54 or older.  However, adults younger than 78 years old with certain underlying conditions such as diabetes, heart or lung disease should also receive the vaccine.  Shingles vaccine to protect against Varicella Zoster if you are older than age 47, or younger than 78 years old with certain underlying illness.  Hepatitis A vaccine to protect against a form of infection of the liver by a virus acquired from food.  Hepatitis B vaccine to protect against a form of infection of the liver by a virus acquired from blood or body fluids, particularly if you work in health care.  If you plan to travel internationally, check with your local health department for specific vaccination recommendations.  Cancer Screening:  Breast cancer screening is essential to preventive care for women. All women age 2 and older should perform a breast self-exam every month. At age 49 and older, women should have their caregiver complete a breast exam each year. Women at ages 8 and older should have a mammogram (x-ray film) of the breasts. Your caregiver can discuss how often you need mammograms.    Cervical cancer screening includes taking a Pap smear (sample of cells examined under a microscope) from the cervix (end of the uterus). It also includes testing for HPV (Human Papilloma Virus, which can cause cervical cancer). Screening and a pelvic exam should begin at age 83, or 3 years after a  woman becomes sexually active. Screening should occur every year, with a Pap smear but no HPV testing, up to age 61. After age 73, you should have a Pap smear every 3 years with HPV testing, if no HPV was found previously.   Most routine colon cancer screening begins at the age of 18. On a yearly basis, doctors may provide special easy to use take-home tests to check for hidden blood in the stool. Sigmoidoscopy or colonoscopy can detect the earliest forms of colon cancer and is life saving. These tests use a small camera at the end of a tube to  directly examine the colon. Speak to your caregiver about this at age 17, when routine screening begins (and is repeated every 5 years unless early forms of pre-cancerous polyps or small growths are found).

## 2014-08-20 NOTE — Progress Notes (Signed)
Complete Physical  Assessment and Plan: 1. Essential hypertension - CBC with Differential - Hepatic function panel - BASIC METABOLIC PANEL WITH GFR - Urinalysis, Routine w reflex microscopic - Microalbumin / creatinine urine ratio - EKG 12-Lead - [place], RETROPERITNL ABD,  LTD  2. Mild mitral regurgitation by prior echocardiogram controlled  3. Pulmonary emphysema, unspecified emphysema type Breathing improved, monitor  4. Hyperlipidemia -continue medications, check lipids, decrease fatty foods, increase activity.  - Lipid panel  5. Prediabetes Discussed general issues about diabetes pathophysiology and management., Educational material distributed., Suggested low cholesterol diet., Encouraged aerobic exercise., Discussed foot care., Reminded to get yearly retinal exam. - Hemoglobin A1c - Insulin, fasting  6. Hypokalemia - BASIC METABOLIC PANEL WITH GFR  7. Hyponatremia  - BASIC METABOLIC PANEL WITH GFR  8. Vitamin D deficiency - Vit D  25 hydroxy (rtn osteoporosis monitoring)  9. Medication management Stop for now, check mag - Magnesium  10. Osteoarthritis of shoulder, unspecified laterality, unspecified osteoarthritis type  11. Lung nodules Continue follow up with Dr. Annamaria Boots  12. Diarrhea Stop magnesium, try probiotic, monitor food intake, if not better follow up with Dr. Deatra Ina. Check ESR/Celiac with decreased weight  13. Generalized abdominal pain + tenderness epigastric, nexium samples given and can try zantac OTC, if not better may get AB US/follow up with GI, check labs  14. Hypothyroidism, unspecified hypothyroidism type - TSH  15. Malnutrition Check labs, try ensure/boost  Discussed med's effects and SE's. Screening labs and tests as requested with regular follow-up as recommended.  HPI  80 y.o. female  presents for a complete physical.   Her blood pressure has been controlled at home, today their BP is BP: 138/70 mmHg  She does workout. She denies  chest pain, shortness of breath, dizziness.  She is not on cholesterol medication and denies myalgias. Her cholesterol is at goal. The cholesterol last visit was:   Lab Results  Component Value Date   CHOL 222* 05/09/2014   HDL 85 05/09/2014   LDLCALC 121* 05/09/2014   TRIG 78 05/09/2014   CHOLHDL 2.6 05/09/2014    She has been working on diet and exercise for prediabetes, and denies paresthesia of the feet, polydipsia, polyuria and visual disturbances. Last A1C in the office was:  Lab Results  Component Value Date   HGBA1C 6.1* 05/09/2014   Patient is on Vitamin D supplement.   Lab Results  Component Value Date   VD25OH 55 05/09/2014     She is on thyroid medication. Her medication was changed last visit, increase dose to 1/5 pill daily except 1 pill on Friday and Sunday.  Lab Results  Component Value Date   TSH 5.035* 06/04/2014  .  She has COPD and follows with Dr. Annamaria Boots, last visit 07/04/2014. She had some edema and weight gain in Sept with a small left pleural effusion after taking minoxdil but she is doing better. She states that she was at a dinner party in March, was eating when she felt sudden abdominal cramping/pain and has diarrhea. She states it has been happening once a month since then. Associated after eating, states she had diarrhea/bad odor/states she has some "churning" sounds after eating but denies cramping until yesterday she had cramping pain yesterday along her entire abdomen, states she felt better after she went to the bathroom. Last colonoscopy was 2008.   Current Medications:  Current Outpatient Prescriptions on File Prior to Visit  Medication Sig Dispense Refill  . ALPRAZolam (XANAX) 0.5 MG tablet Take 1/2  to 1 tablet  2 or 3 x daily as directed for nerves or sleep 90 tablet 3  . amLODipine (NORVASC) 5 MG tablet 1/2 pill PRN if blood pressure is greater than 150/90 30 tablet 3  . Ascorbic Acid (VITAMIN C) 500 MG tablet Take 500 mg by mouth daily.      [name]  atenolol (TENORMIN) 100 MG tablet TAKE ONE (1) TABLET BY MOUTH EVERY DAY 90 tablet 99  . cholecalciferol (VITAMIN D) 1000 UNITS tablet Take 1,000 Units by mouth daily.     [name] levothyroxine (SYNTHROID, LEVOTHROID) 50 MCG tablet Take 1 tablet (50 mcg total) by mouth as directed. Take 0.5 tablets every day except Sunday. (Patient taking differently: Take 50 mcg by mouth as directed. Take 1/2 pill times 5 days and 1 pill Fri. and Sun.) 90 tablet 1  . lisinopril (PRINIVIL,ZESTRIL) 20 MG tablet Take 1 tablet (20 mg total) by mouth daily. For BP 90 tablet 99  . losartan (COZAAR) 100 MG tablet TAKE ONE-HALF IN THE MORNING AND ONE-HALF AT NIGHT 90 tablet 2  . magnesium gluconate (MAGONATE) 500 MG tablet Take 500 mg by mouth daily.     [name] omeprazole (PRILOSEC) 40 MG capsule Take 1 capsule (40 mg total) by mouth daily. 90 capsule 1  . pregabalin (LYRICA) 50 MG capsule Take 1 capsule (50 mg total) by mouth 2 (two) times daily. Gave pt sample on 08/07/14. 21 capsule 0  . triamterene-hydrochlorothiazide (MAXZIDE) 75-50 MG per tablet Take 30 tablets by mouth daily. Take 1/2 to 1 tablet daily As Directed for BP & fluid 30 tablet 99   No current facility-administered medications on file prior to visit.   Health Maintenance:   Immunization History  Administered Date(s) Administered  . Influenza Split 07/16/2013  . Influenza,inj,Quad PF,36+ Mos 07/04/2014  . Pneumococcal Conjugate-13 02/26/2014  . Td 06/26/2013    Last colonoscopy: 2008 Last mammogram: 07/06/2013 Last pap smear/pelvic exam: remote DEXA: 07/06/2013 + osteoporosis  Prior vaccinations: TD or Tdap: 2014 Influenza: 06/2014 Pneumococcal: 2008 Prevnar 13: 02/2014 Shingles/Zostavax: declines  1. [facility]- here for primary care 2. unkn[place], eye doctor, last visit unknown 3. unknown, dentist, last visit 2 years Patient Care Team: Unk Pinto, MD as PCP - General (Internal  Medicine) Deneise Lever, MD as Consulting Physician (Pulmonary Disease) Lelon Perla, MD as Consulting Physician (Cardiology) Inda Castle, MD as Consulting Physician (Gastroenterology) Rolm Bookbinder, MD as Consulting Physician (Dermatology)  Allergies:  Allergies  Allergen Reactions  . Sulfa Antibiotics Other (See Comments)    Unknown   Medical History:  Past Medical History  Diagnosis Date  . Palpitations   . HLD (hyperlipidemia)   . DJD (degenerative joint disease)   . Mild mitral regurgitation by prior echocardiogram   . Mild tricuspid regurgitation   . HTN (hypertension)    Surgical History:  Past Surgical History  Procedure Laterality Date  . Abdominal hysterectomy  1993  . Cataract extraction, bilateral     Family History:  Family History  Problem Relation Age of Onset  . Cancer Father   . Emphysema Sister   . Emphysema Sister   . Emphysema Mother   . Rheumatic fever Sister   . Heart attack Sister   . Diabetes Mother   . Diabetes Sister   . Diabetes Brother   . Asthma Brother     as a child   Social History:  History  Substance Use Topics  . Smoking status: Former Smoker --  1.00 packs/day for 20 years    Types: Cigarettes    Quit date: 09/27/1974  . Smokeless tobacco: Never Used  . Alcohol Use: No     Review of Systems: _0  = complains of  _1  = denies  General: Fatigue _2  Fever _3  Chills _4  Weakness _5   Insomnia _6 Weight change [x ] Night sweats _7   Change in appetite _8  Head: Head Trauma _9  Eyes: Wears glasses or corrective lens _10  Redness _11  Blurred vision _12  Diplopia _13  Discharge _14  Floaters _15  LNL:GXQJJHE _16  hearing loss _17  Tinnitus _18  Ear Discharge _19   Congestion _20  Sinus Pain _21  Post Nasal Drip _22  Nose Bleeds _23  Rhinorrhea _24    Difficulty Swallowing [x ] Snoring _25  Sore Throat _26  Cardiac:   Chest pain/pressure _27  SOB _28  Orthopnea _29   Palpitations [x ]  Paroxysmal nocturnal dyspnea_30  Claudication _31  Edema  _32  Difficulty walking around block or climbing stairs _33  Pulmonary: Cough _34  Wheezing_35   SOB [ x]  Pleurisy _36  Asthma _37  GI: Nausea _38  Vomiting_39  Dysphagia_40  Heartburn_41  Abdominal pain [x ] Constipation _42 ; Diarrhea _43  BRBPR _44  Melena_45  Bloating _46  Hemorrhoids _47  Incontinence _48  GU: Hematuria_49  Dysuria _50  Nocturia_51  Urgency _52   Hesitancy _53  Discharge _54  Frequency _55  Incontinence _56  Breast:  Dimpling _57  Breast lumps _58   Breast Lesions _59  Nipple discharge _60    Neuro: Headaches_61  Vertigo_62  Paresthesias_63  Spasm _64  Speech changes _65  Incoordination _66  Dizziness _67  Numbness _68  Ortho: Arthritis _69  Joint pain [x ] Muscle pain [x ] Joint swelling _70  Back Pain _71  Weakness _72  Stiffness _73  Skin:  Rash _74   Pruritis _75  Change in skin lesion _76  Change in hair _77  Change in nails _78  Psych: Depression_79  Anxiety_80  Stress _81  Confusion _82  Memory loss _83   Heme/Lymph: Bleeding _84  Bruising _85  History of anemia _86  Enlarged lymph nodes _87   Endocrine: Visual blurring _88  Paresthesia _89  Polyuria _90  Polydipsia _91  Polyphagia _92   Heat/cold intolerance _93  Hypoglycemia _94  Thyroid Issues _95  Diabetes _96   Physical Exam: Estimated body mass index is 17.19 kg/(m^2) as calculated from the following:   Height as of this encounter: _97  (1.6 m).   Weight as of this encounter: 97 lb (43.999 kg). BP 138/70 mmHg  Pulse 72  Temp(Src) 98 F (36.7 C)  Resp 16  Ht _98  (1.6 m)  Wt 97 lb (43.999 kg)  BMI 17.19 kg/m2 General Appearance: Malnourished/underweight in no apparent distress.  Eyes: PERRLA, EOMs, conjunctiva no swelling or erythema, normal fundi and vessels.  Sinuses: No Frontal/maxillary tenderness  ENT/Mouth: Ext aud canals clear on the right but with cerumen impaction on the left, normal light reflex with TMs without erythema, bulging. Good dentition. No erythema, swelling, or exudate on post pharynx. Tonsils not swollen or erythematous. Hearing decreased Neck:  Supple, thyroid normal. No bruits  Respiratory: Respiratory effort normal, decreased breathsounds, BS equal bilaterally without rales, rhonchi, wheezing or stridor.  Cardio: RRR with holosystolic 2/6, prominent S2. Brisk peripheral pulses without edema.  Chest: symmetric, with normal excursions and percussion.  Breasts: defer  Abdomen: Soft, + epigastric tenderness, no guarding, rebound, hernias, masses, or organomegaly. .  Lymphatics: Non tender without lymphadenopathy.  Genitourinary:  Musculoskeletal: Full ROM all peripheral extremities,4/5 strength, and normal gait.  Skin: Warm, dry without rashes, lesions, ecchymosis. Neuro: Cranial nerves intact, reflexes equal bilaterally. Normal muscle tone, no cerebellar symptoms. [name]  Psych: Awake and oriented X 3, normal affect, Insight and Judgment appropriate.   EKG: No changes, LVH versus body habitus. No ST changes.  AORTA SCAN: WNL    Vicie Mutters 3:21 PM [facility]

## 2014-08-21 LAB — URINALYSIS, ROUTINE W REFLEX MICROSCOPIC
BILIRUBIN URINE: NEGATIVE
Glucose, UA: NEGATIVE mg/dL
Hgb urine dipstick: NEGATIVE
Ketones, ur: NEGATIVE mg/dL
Leukocytes, UA: NEGATIVE
Nitrite: NEGATIVE
PH: 7 (ref 5.0–8.0)
Protein, ur: NEGATIVE mg/dL
SPECIFIC GRAVITY, URINE: 1.008 (ref 1.005–1.030)
Urobilinogen, UA: 0.2 mg/dL (ref 0.0–1.0)

## 2014-08-21 LAB — TSH: TSH: 1.236 u[IU]/mL (ref 0.350–4.500)

## 2014-08-21 LAB — BASIC METABOLIC PANEL WITH GFR
BUN: 25 mg/dL — AB (ref 6–23)
CHLORIDE: 89 meq/L — AB (ref 96–112)
CO2: 30 meq/L (ref 19–32)
Calcium: 9.3 mg/dL (ref 8.4–10.5)
Creat: 0.99 mg/dL (ref 0.50–1.10)
GFR, Est African American: 61 mL/min
GFR, Est Non African American: 53 mL/min — ABNORMAL LOW
Glucose, Bld: 88 mg/dL (ref 70–99)
POTASSIUM: 4.3 meq/L (ref 3.5–5.3)
Sodium: 129 mEq/L — ABNORMAL LOW (ref 135–145)

## 2014-08-21 LAB — HEMOGLOBIN A1C
Hgb A1c MFr Bld: 6 % — ABNORMAL HIGH (ref <5.7)
Mean Plasma Glucose: 126 mg/dL — ABNORMAL HIGH (ref <117)

## 2014-08-21 LAB — HEPATIC FUNCTION PANEL
ALT: 13 U/L (ref 0–35)
AST: 19 U/L (ref 0–37)
Albumin: 4 g/dL (ref 3.5–5.2)
Alkaline Phosphatase: 74 U/L (ref 39–117)
BILIRUBIN DIRECT: 0.2 mg/dL (ref 0.0–0.3)
BILIRUBIN INDIRECT: 0.6 mg/dL (ref 0.2–1.2)
BILIRUBIN TOTAL: 0.8 mg/dL (ref 0.2–1.2)
Total Protein: 7 g/dL (ref 6.0–8.3)

## 2014-08-21 LAB — MICROALBUMIN / CREATININE URINE RATIO
CREATININE, URINE: 62.3 mg/dL
MICROALB UR: 9.8 mg/dL — AB (ref <2.0)
MICROALB/CREAT RATIO: 157.3 mg/g — AB (ref 0.0–30.0)

## 2014-08-21 LAB — CELIAC PANEL 10
Endomysial Screen: NEGATIVE
GLIADIN IGA: 9 U/mL (ref <20)
GLIADIN IGG: 3 U/mL (ref <20)
IgA: 209 mg/dL (ref 69–380)
TISSUE TRANSGLUT AB: 2 U/mL (ref <6)
Tissue Transglutaminase Ab, IgA: <1 U/mL (ref <4)

## 2014-08-21 LAB — LIPID PANEL
CHOL/HDL RATIO: 1.9 ratio
Cholesterol: 204 mg/dL — ABNORMAL HIGH (ref 0–200)
HDL: 105 mg/dL (ref >39)
LDL CALC: 80 mg/dL (ref 0–99)
TRIGLYCERIDES: 97 mg/dL (ref <150)
VLDL: 19 mg/dL (ref 0–40)

## 2014-08-21 LAB — INSULIN, FASTING: Insulin fasting, serum: 2.8 u[IU]/mL (ref 2.0–19.6)

## 2014-08-21 LAB — MAGNESIUM: Magnesium: 1.7 mg/dL (ref 1.5–2.5)

## 2014-08-21 LAB — VITAMIN D 25 HYDROXY (VIT D DEFICIENCY, FRACTURES): Vit D, 25-Hydroxy: 34 ng/mL (ref 30–100)

## 2014-08-21 LAB — SEDIMENTATION RATE: SED RATE: 57 mm/h — AB (ref 0–22)

## 2014-08-21 MED ORDER — CIPROFLOXACIN HCL 500 MG PO TABS: 500.0000 mg | ORAL_TABLET | Freq: Two times a day (BID) | ORAL | Status: AC

## 2014-08-21 NOTE — Addendum Note (Signed)
Addended by: Vicie Mutters R on: 08/21/2014 08:24 AM   Modules accepted: Orders

## 2014-09-03 ENCOUNTER — Ambulatory Visit: Payer: Self-pay

## 2014-09-17 ENCOUNTER — Other Ambulatory Visit: Payer: Self-pay | Admitting: Physician Assistant

## 2014-09-17 ENCOUNTER — Ambulatory Visit (INDEPENDENT_AMBULATORY_CARE_PROVIDER_SITE_OTHER): Payer: Medicare Other | Admitting: *Deleted

## 2014-09-17 DIAGNOSIS — Z79899 Other long term (current) drug therapy: Secondary | ICD-10-CM

## 2014-09-17 DIAGNOSIS — R899 Unspecified abnormal finding in specimens from other organs, systems and tissues: Secondary | ICD-10-CM

## 2014-09-17 DIAGNOSIS — M1990 Unspecified osteoarthritis, unspecified site: Secondary | ICD-10-CM

## 2014-09-17 LAB — BASIC METABOLIC PANEL WITH GFR
BUN: 16 mg/dL (ref 6–23)
CO2: 31 mEq/L (ref 19–32)
CREATININE: 0.85 mg/dL (ref 0.50–1.10)
Calcium: 9.1 mg/dL (ref 8.4–10.5)
Chloride: 97 mEq/L (ref 96–112)
GFR, EST AFRICAN AMERICAN: 73 mL/min
GFR, Est Non African American: 64 mL/min
Glucose, Bld: 110 mg/dL — ABNORMAL HIGH (ref 70–99)
Potassium: 4 mEq/L (ref 3.5–5.3)
SODIUM: 136 meq/L (ref 135–145)

## 2014-09-17 NOTE — Progress Notes (Signed)
Patient ID: Andrea [name], female   DOB: [birth year], 80 y.o.   MRN: [id number] Patient presents for f/u recheck labs per Vicie Mutters, PA-C orders.  Patient completed abx AD.

## 2014-09-18 LAB — CBC WITH DIFFERENTIAL/PLATELET
Basophils Absolute: 0.1 10*3/uL (ref 0.0–0.1)
Basophils Relative: 1 % (ref 0–1)
EOS ABS: 0.2 10*3/uL (ref 0.0–0.7)
EOS PCT: 4 % (ref 0–5)
HCT: 34.3 % — ABNORMAL LOW (ref 36.0–46.0)
Hemoglobin: 11.5 g/dL — ABNORMAL LOW (ref 12.0–15.0)
Lymphocytes Relative: 24 % (ref 12–46)
Lymphs Abs: 1.5 10*3/uL (ref 0.7–4.0)
MCH: 29.9 pg (ref 26.0–34.0)
MCHC: 33.5 g/dL (ref 30.0–36.0)
MCV: 89.1 fL (ref 78.0–100.0)
MPV: 10.6 fL (ref 9.4–12.4)
Monocytes Absolute: 0.6 10*3/uL (ref 0.1–1.0)
Monocytes Relative: 10 % (ref 3–12)
Neutro Abs: 3.8 10*3/uL (ref 1.7–7.7)
Neutrophils Relative %: 61 % (ref 43–77)
Platelets: 244 10*3/uL (ref 150–400)
RBC: 3.85 MIL/uL — ABNORMAL LOW (ref 3.87–5.11)
RDW: 13.9 % (ref 11.5–15.5)
WBC: 6.2 10*3/uL (ref 4.0–10.5)

## 2014-09-18 LAB — SEDIMENTATION RATE: SED RATE: 18 mm/h (ref 0–22)

## 2014-09-18 LAB — IRON AND TIBC
%SAT: 33 % (ref 20–55)
IRON: 107 ug/dL (ref 42–145)
TIBC: 323 ug/dL (ref 250–470)
UIBC: 216 ug/dL (ref 125–400)

## 2014-09-18 LAB — FERRITIN: Ferritin: 123 ng/mL (ref 10–291)

## 2014-10-04 DIAGNOSIS — H04123 Dry eye syndrome of bilateral lacrimal glands: Secondary | ICD-10-CM | POA: Diagnosis not present

## 2014-10-04 DIAGNOSIS — H1859 Other hereditary corneal dystrophies: Secondary | ICD-10-CM | POA: Diagnosis not present

## 2014-10-04 DIAGNOSIS — Z961 Presence of intraocular lens: Secondary | ICD-10-CM | POA: Diagnosis not present

## 2014-10-05 DIAGNOSIS — J449 Chronic obstructive pulmonary disease, unspecified: Secondary | ICD-10-CM | POA: Diagnosis not present

## 2014-10-07 ENCOUNTER — Telehealth: Payer: Self-pay

## 2014-10-07 ENCOUNTER — Other Ambulatory Visit: Payer: Self-pay

## 2014-10-07 NOTE — Telephone Encounter (Signed)
Received paper note from front office staff, patient called and wanted to know which BP medication to take, Losartan or Lisinopril ? Called patient and per Vicie Mutters, PA., patient is to only take the Losartan and to d/c the Lisinopril. Patient verbalized understanding.

## 2014-11-04 ENCOUNTER — Ambulatory Visit (INDEPENDENT_AMBULATORY_CARE_PROVIDER_SITE_OTHER): Payer: Medicare Other | Admitting: Internal Medicine

## 2014-11-04 ENCOUNTER — Encounter: Payer: Self-pay | Admitting: Internal Medicine

## 2014-11-04 VITALS — BP 140/76 | HR 72 | Ht 63.0 in | Wt 99.6 lb

## 2014-11-04 DIAGNOSIS — J432 Centrilobular emphysema: Secondary | ICD-10-CM | POA: Diagnosis not present

## 2014-11-04 DIAGNOSIS — R918 Other nonspecific abnormal finding of lung field: Secondary | ICD-10-CM | POA: Diagnosis not present

## 2014-11-04 NOTE — Patient Instructions (Signed)
We can continue O2 2L for sleep   We can continue present treatment. Please call as needed

## 2014-11-04 NOTE — Progress Notes (Signed)
12/18/13- 89 yoF former smoker [name]; sent back by [facility] Dr Melford Aase.   For reassessment of COPD  History of lung nodules which were stable at last visit 05/08/2010 with CT chest 05/12/2010. Known emphysema and stable subcentimeter bilateral pulmonary nodules. Several episodes of pneumonia. Has used oxygen for sleep. Paces herself during the day and denies cough while recognizing dyspnea on exertion with brisk walking. History of mild valvular heart disease, hypertension, irregular heartbeat. No history of TB. Lives with husband CXR 10/01/13 IMPRESSION:  Emphysema with airspace disease in the right base suggesting  pneumonia.  Electronically Signed  By: Misty Stanley M.D.  On: 10/01/2013 12:49  02/28/14-  75 yoF former smoker followed for COPD, hx lung nodules,  complicated by HBP, mild valvular heart disease, hypertension, irregular heartbeat FOLLOWS FOR: Pt states breathing has improved since last visit. Pt states she has an increase in energy. C/o mild SOB with activity. C/o dry cough at night. Denies CP/tightness.  Postnasal drip. Avoids antihistamines which overdry her eyes. Uses no respiratory meds. Sleeps with oxygen 2 L started by hospital-she questions need. Walks treadmill at gym  +will need PFT+  07/04/14- 84 yoF former smoker followed for COPD, hx lung nodules,  complicated by HBP, mild valvular heart disease, hypertension, irregular heartbeat FOLLOW FOR: Pulmonary Emphysema; sometimes gets tickle in throat, but no cough, no chest tightness, no complaints O2 2L/ Advanced    CXR 06/06/14 IMPRESSION:  1. Small left pleural effusion.  2. COPD.  Electronically Signed  By: Kathreen Devoid  On: 06/06/2014 15:06  11/04/14- 85 yoF former smoker followed for COPD, hx lung nodules,  complicated by HBP, mild valvular heart disease, hypertension, irregular heartbeat.  daughter here FOLLOWS FOR: "always stays SOB". Otherwise doing well overall. Husband has a  cold but she hopes she will catch it. Has home oxygen since pneumonia a year ago, 2 L for sleep/APS.  ROS-see HPI Constitutional:   No-   weight loss, night sweats, fevers, chills, fatigue, lassitude. HEENT:   No-  headaches, difficulty swallowing, tooth/dental problems, sore throat,       No-  sneezing, itching, ear ache, nasal congestion, post nasal drip,  CV:  No-   chest pain, orthopnea, PND, swelling in lower extremities, anasarca,                                  dizziness, palpitations Resp: +shortness of breath with exertion or at rest.              No-   productive cough,  No non-productive cough,  No- coughing up of blood.              No-   change in color of mucus.  No- wheezing.   Skin: No-   rash or lesions. GI:  No-   heartburn, indigestion, abdominal pain, nausea, vomiting,  GU:  MS:  No-   joint pain or swelling.  . Neuro-     nothing unusual Psych:  No- change in mood or affect. No depression or anxiety.  No memory loss.  OBJ- Physical Exam    O2 sat 97% room air today General- Alert, Oriented, Affect-appropriate, Distress- none acute. Slender Skin- rash-none, lesions- none, excoriation- none Lymphadenopathy- none Head- atraumatic            Eyes- Gross vision intact, PERRLA, conjunctivae and secretions clear  Ears- +cerumen            Nose- Clear, no-Septal dev, mucus, polyps, erosion, perforation             Throat- Mallampati II , mucosa clear , drainage- none, tonsils- atrophic. Own teeth Neck- flexible , trachea midline, no stridor , thyroid nl, carotid no bruit Chest - symmetrical excursion , unlabored           Heart/CV- RRR , no murmur , no gallop  , no rub, nl s1 s2                           - JVD- none , edema- none, stasis changes- none, varices- none           Lung- distant/clear to P&A, wheeze- none, cough- none , dullness-none, rub- none           Chest wall-  Abd-  Br/ Gen/ Rectal- Not done, not indicated Extrem- cyanosis- none, clubbing,  none, atrophy- none, strength- nl Neuro- grossly intact to observation

## 2014-11-05 ENCOUNTER — Telehealth: Payer: Self-pay | Admitting: Internal Medicine

## 2014-11-05 DIAGNOSIS — J449 Chronic obstructive pulmonary disease, unspecified: Secondary | ICD-10-CM | POA: Diagnosis not present

## 2014-11-05 DIAGNOSIS — J432 Centrilobular emphysema: Secondary | ICD-10-CM

## 2014-11-05 NOTE — Assessment & Plan Note (Signed)
Not noted on chest x-ray 05/2014

## 2014-11-05 NOTE — Telephone Encounter (Signed)
atc line busy wcb

## 2014-11-05 NOTE — Assessment & Plan Note (Signed)
Continues to need oxygen during sleep. No acute infection so far this winter. Plan-continue present management as reviewed. Questions answered.

## 2014-11-06 ENCOUNTER — Encounter: Payer: Self-pay | Admitting: Internal Medicine

## 2014-11-06 ENCOUNTER — Ambulatory Visit (INDEPENDENT_AMBULATORY_CARE_PROVIDER_SITE_OTHER): Payer: Medicare Other | Admitting: Internal Medicine

## 2014-11-06 ENCOUNTER — Ambulatory Visit (HOSPITAL_COMMUNITY)
Admission: RE | Admit: 2014-11-06 | Discharge: 2014-11-06 | Disposition: A | Discharge status: Home / Self Care | Payer: Medicare Other | Source: Ambulatory Visit | Attending: Internal Medicine | Admitting: Internal Medicine

## 2014-11-06 VITALS — BP 144/76 | HR 72 | Temp 98.1°F | Resp 16 | Ht 63.0 in | Wt <= 1120 oz

## 2014-11-06 DIAGNOSIS — M24811 Other specific joint derangements of right shoulder, not elsewhere classified: Secondary | ICD-10-CM

## 2014-11-06 DIAGNOSIS — M19011 Primary osteoarthritis, right shoulder: Secondary | ICD-10-CM | POA: Diagnosis not present

## 2014-11-06 DIAGNOSIS — M24111 Other articular cartilage disorders, right shoulder: Secondary | ICD-10-CM | POA: Diagnosis not present

## 2014-11-06 DIAGNOSIS — Z79899 Other long term (current) drug therapy: Secondary | ICD-10-CM | POA: Diagnosis not present

## 2014-11-06 LAB — CBC WITH DIFFERENTIAL/PLATELET
Basophils Absolute: 0 10*3/uL (ref 0.0–0.1)
Basophils Relative: 0 % (ref 0–1)
Eosinophils Absolute: 0.2 10*3/uL (ref 0.0–0.7)
Eosinophils Relative: 2 % (ref 0–5)
HCT: 38.7 % (ref 36.0–46.0)
Hemoglobin: 12.7 g/dL (ref 12.0–15.0)
Lymphocytes Relative: 18 % (ref 12–46)
Lymphs Abs: 1.5 10*3/uL (ref 0.7–4.0)
MCH: 30 pg (ref 26.0–34.0)
MCHC: 32.8 g/dL (ref 30.0–36.0)
MCV: 91.5 fL (ref 78.0–100.0)
MPV: 11.2 fL (ref 8.6–12.4)
Monocytes Absolute: 0.7 10*3/uL (ref 0.1–1.0)
Monocytes Relative: 8 % (ref 3–12)
NEUTROS ABS: 6 10*3/uL (ref 1.7–7.7)
Neutrophils Relative %: 72 % (ref 43–77)
PLATELETS: 253 10*3/uL (ref 150–400)
RBC: 4.23 MIL/uL (ref 3.87–5.11)
RDW: 13.1 % (ref 11.5–15.5)
WBC: 8.4 10*3/uL (ref 4.0–10.5)

## 2014-11-06 MED ORDER — HYDROCODONE-ACETAMINOPHEN 5-325 MG PO TABS: ORAL_TABLET | ORAL | Status: AC

## 2014-11-06 NOTE — Progress Notes (Signed)
   Subjective:    Patient ID: Andrea [name], female    DOB: 03-26-1929, 81 y.o.   MRN: [id number]  HPI    Patient presents with 5 da hx/o right shoulderpain and relates much longer period of difficultly raising her arm completely. Adamantly denies and recent or remote injury.   Medication Sig  . ALPRAZolam (XANAX) 0.5 MG tablet Take 1/2 to 1 tablet  2 or 3 x daily as directed for nerves or sleep  . Ascorbic Acid (VITAMIN C) 500 MG tablet Take 500 mg by mouth daily.    [name] atenolol (TENORMIN) 100 MG tablet TAKE ONE (1) TABLET BY MOUTH EVERY DAY  . cholecalciferol (VITAMIN D) 1000 UNITS tablet Take 1,000 Units by mouth daily.   [name] levothyroxine (SYNTHROID, LEVOTHROID) 50 MCG tablet Take 1 tablet (50 mcg total) by mouth as directed. Take 0.5 tablets every day except Sunday. (Patient taking differently: Take 50 mcg by mouth as directed. Take 1/2 pill times 5 days and 1 pill Fri. and Sun.)  . losartan (COZAAR) 100 MG tablet TAKE ONE-HALF IN THE MORNING AND ONE-HALF AT NIGHT  . omeprazole (PRILOSEC) 40 MG capsule Take 1 capsule (40 mg total) by mouth daily.  [name] triamterene-hydrochlorothiazide (MAXZIDE) 75-50 MG per tablet Take 30 tablets by mouth daily. Take 1/2 to 1 tablet daily As Directed for BP & fluid   Allergies  Allergen Reactions  . Sulfa Antibiotics Other (See Comments)    Unknown   Past Medical History  Diagnosis Date  . Palpitations   . HLD (hyperlipidemia)   . DJD (degenerative joint disease)   . Mild mitral regurgitation by prior echocardiogram   . Mild tricuspid regurgitation   . HTN (hypertension)    Review of Systems neg 10 point review except as above.    Objective:   Physical Exam  BP 144/76 mmHg  Pulse 72  Temp(Src) 98.1 F (36.7 C)  Resp 16  Ht _0  (1.6 m)  Wt 10 lb 6.4 oz (4.717 kg)  BMI 1.84 kg/m2  Focused exam finds ecchymotic swelling of the right shoulder with suspected tense effusion. Marked decrease in internal/external rotation as well as decrease in  abduction. ROM distal to elbow is WNL. Sensory motor and vascular testing of the RUE is otherwise WNL.    Assessment & Plan:   1. Internal derangement of shoulder, right  - DG Shoulder Right; Showed Deg changes and chronic rotator cuff tear  - HYDROcodone-acetaminophen (NORCO) 5-325 MG per tablet; Take 1/2 to 1 tablets every 3 to 4 hours as needed for pain  Dispense: 50 tablet; Refill: 0  - Ambulatory referral to Orthopedic Surgery  2. Medication management  - CBC with Differential/Platelet

## 2014-11-06 NOTE — Patient Instructions (Signed)
Shoulder Pain The shoulder is the joint that connects your arms to your body. The bones that form the shoulder joint include the upper arm bone (humerus), the shoulder blade (scapula), and the collarbone (clavicle). The top of the humerus is shaped like a ball and fits into a rather flat socket on the scapula (glenoid cavity). A combination of muscles and strong, fibrous tissues that connect muscles to bones (tendons) support your shoulder joint and hold the ball in the socket. Small, fluid-filled sacs (bursae) are located in different areas of the joint. They act as cushions between the bones and the overlying soft tissues and help reduce friction between the gliding tendons and the bone as you move your arm. Your shoulder joint allows a wide range of motion in your arm. This range of motion allows you to do things like scratch your back or throw a ball. However, this range of motion also makes your shoulder more prone to pain from overuse and injury. Causes of shoulder pain can originate from both injury and overuse and usually can be grouped in the following four categories:  Redness, swelling, and pain (inflammation) of the tendon (tendinitis) or the bursae (bursitis).  Instability, such as a dislocation of the joint.  Inflammation of the joint (arthritis).  Broken bone (fracture). HOME CARE INSTRUCTIONS   Apply ice to the sore area.  Put ice in a plastic bag.  Place a towel between your skin and the bag.  Leave the ice on for 15-20 minutes, 3-4 times per day for the first 2 days, or as directed by your health care provider.  Stop using cold packs if they do not help with the pain.  If you have a shoulder sling or immobilizer, wear it as long as your caregiver instructs. Only remove it to shower or bathe. Move your arm as little as possible, but keep your hand moving to prevent swelling.  Squeeze a soft ball or foam pad as much as possible to help prevent swelling.  Only take  over-the-counter or prescription medicines for pain, discomfort, or fever as directed by your caregiver. SEEK MEDICAL CARE IF:   Your shoulder pain increases, or new pain develops in your arm, hand, or fingers.  Your hand or fingers become cold and numb.  Your pain is not relieved with medicines. SEEK IMMEDIATE MEDICAL CARE IF:   Your arm, hand, or fingers are numb or tingling.  Your arm, hand, or fingers are significantly swollen or turn white or blue. MAKE SURE YOU:   Understand these instructions.  Will watch your condition.  Will get help right away if you are not doing well or get worse. Document Released: 06/23/2005 Document Revised: 01/28/2014 Document Reviewed: 08/28/2011 [facility] Patient Information 2015 [place], [place]. This information is not intended to replace advice given to you by your health care provider. Make sure you discuss any questions you have with your health care provider.

## 2014-11-07 NOTE — Telephone Encounter (Signed)
Pt aware and order placed. Nothing further needed 

## 2014-11-07 NOTE — Telephone Encounter (Signed)
Medicare regs may require that she have back-u;p tanks in addition to her O2 regulator Ok to d/c portable O2. Continue O2 2L for sleep.

## 2014-11-07 NOTE — Telephone Encounter (Signed)
Spoke with pt - reports she is only using o2 qhs.  She would like ALL o2 picked up except for the concentrator.  Pt feels if power is out for a few days, she will be fine with no o2 qhs.  Dr. Annamaria Boots, pls advise if you are ok with an order for this.  Thank you.

## 2014-11-13 ENCOUNTER — Other Ambulatory Visit: Payer: Self-pay | Admitting: Internal Medicine

## 2014-11-21 ENCOUNTER — Ambulatory Visit: Payer: Self-pay | Admitting: Internal Medicine

## 2014-11-26 ENCOUNTER — Other Ambulatory Visit: Payer: Self-pay | Admitting: Internal Medicine

## 2014-12-03 ENCOUNTER — Encounter: Payer: Self-pay | Admitting: Physician Assistant

## 2014-12-03 ENCOUNTER — Ambulatory Visit (INDEPENDENT_AMBULATORY_CARE_PROVIDER_SITE_OTHER): Payer: Medicare Other | Admitting: Physician Assistant

## 2014-12-03 VITALS — BP 140/78 | HR 76 | Temp 97.8°F | Resp 16 | Ht 63.0 in | Wt 99.0 lb

## 2014-12-03 DIAGNOSIS — E039 Hypothyroidism, unspecified: Secondary | ICD-10-CM

## 2014-12-03 DIAGNOSIS — R7303 Prediabetes: Secondary | ICD-10-CM

## 2014-12-03 DIAGNOSIS — Z0001 Encounter for general adult medical examination with abnormal findings: Secondary | ICD-10-CM

## 2014-12-03 DIAGNOSIS — M19019 Primary osteoarthritis, unspecified shoulder: Secondary | ICD-10-CM

## 2014-12-03 DIAGNOSIS — M7501 Adhesive capsulitis of right shoulder: Secondary | ICD-10-CM

## 2014-12-03 DIAGNOSIS — R6889 Other general symptoms and signs: Secondary | ICD-10-CM

## 2014-12-03 DIAGNOSIS — F4320 Adjustment disorder, unspecified: Secondary | ICD-10-CM

## 2014-12-03 DIAGNOSIS — I340 Nonrheumatic mitral (valve) insufficiency: Secondary | ICD-10-CM

## 2014-12-03 DIAGNOSIS — E46 Unspecified protein-calorie malnutrition: Secondary | ICD-10-CM

## 2014-12-03 DIAGNOSIS — R7309 Other abnormal glucose: Secondary | ICD-10-CM | POA: Diagnosis not present

## 2014-12-03 DIAGNOSIS — E876 Hypokalemia: Secondary | ICD-10-CM

## 2014-12-03 DIAGNOSIS — E559 Vitamin D deficiency, unspecified: Secondary | ICD-10-CM | POA: Diagnosis not present

## 2014-12-03 DIAGNOSIS — I10 Essential (primary) hypertension: Secondary | ICD-10-CM

## 2014-12-03 DIAGNOSIS — E871 Hypo-osmolality and hyponatremia: Secondary | ICD-10-CM

## 2014-12-03 DIAGNOSIS — M25511 Pain in right shoulder: Secondary | ICD-10-CM

## 2014-12-03 DIAGNOSIS — Z79899 Other long term (current) drug therapy: Secondary | ICD-10-CM

## 2014-12-03 DIAGNOSIS — R918 Other nonspecific abnormal finding of lung field: Secondary | ICD-10-CM

## 2014-12-03 DIAGNOSIS — J432 Centrilobular emphysema: Secondary | ICD-10-CM

## 2014-12-03 DIAGNOSIS — E782 Mixed hyperlipidemia: Secondary | ICD-10-CM

## 2014-12-03 DIAGNOSIS — F4321 Adjustment disorder with depressed mood: Secondary | ICD-10-CM

## 2014-12-03 DIAGNOSIS — Z9181 History of falling: Secondary | ICD-10-CM

## 2014-12-03 NOTE — Patient Instructions (Signed)
Get on maxide daily until swelling is better, then can do every other day.   Adhesive Capsulitis Sometimes the shoulder becomes stiff and is painful to move. Some people say it feels as if the shoulder is frozen in place. Because of this, the condition is called "frozen shoulder." Its medical name is adhesive capsulitis.  The shoulder joint is made up of strong connective tissue that attaches the ball of the humerus to the shallow shoulder socket. This strong connective tissue is called the joint capsule. This tissue can become stiff and swollen. That is when adhesive capsulitis sets in. CAUSES  It is not always clear just what the cause adhesive capsulitis. Possibilities include:  Injury to the shoulder joint.  Strain. This is a repetitive injury brought about by overuse.  Lack of use. Perhaps your arm or hand was otherwise injured. It might have been in a sling for awhile. Or perhaps you were not using it to avoid pain.  Referred pain. This is a sort of trick the body plays. You feel pain in the shoulder. But, the pain actually comes from an injury somewhere else in the body.  Long-standing health problems. Several diseases can cause adhesive capsulitis. They include diabetes, heart disease, stroke, thyroid problems, rheumatoid arthritis and lung disease.  Being a women older than 72. Anyone can develop adhesive capsulitis but it is most common in women in this age group. SYMPTOMS   Pain.  It occurs when the arm is moved.  Parts of the shoulder might hurt if they are touched.  Pain is worse at night or when resting.  Soreness. It might not be strong enough to be called pain. But, the shoulder aches.  The shoulder does not move freely.  Muscle spasms.  Trouble sleeping because of shoulder ache or pain. DIAGNOSIS  To decide if you have adhesive capsulitis, your healthcare provider will probably:  Ask about symptoms you have noticed.  Ask about your history of joint pain and  anything that might have caused the pain.  Ask about your overall health.  Use hands to feel your shoulder and neck.  Ask you to move your shoulder in specific directions. This may indicate the origin of the pain.  Order imaging tests; pictures of the shoulder. They help pinpoint the source of the problem. An X-ray might be used. For more detail, an MRI is often used. An MRI details the tendons, muscles and ligaments as well as the joint. TREATMENT  Adhesive capsulitis can be treated several ways. Most treatments can be done in a clinic or in your healthcare provider's office. Be sure to discuss the different options with your caregiver. They include:  Physical therapy. You will work on specific exercises to get your shoulder moving again. The exercises usually involve stretching. A physical therapist (a caregiver with special training) can show you what to do and what not to do. The exercises will need to be done daily.  Medication.  Over-the-counter medicines may relieve pain and inflammation (the body's way of reacting to injury or infection).  Corticosteroids. These are stronger drugs to reduce pain and inflammation. They are given by injection (shots) into the shoulder joint. Frequent treatment is not recommended.  Muscle relaxants. Medication may be prescribed to ease muscle spasms.  Treatment of underlying conditions. This means treating another condition that is causing your shoulder problem. This might be a rotator cuff (tendon) problem  Shoulder manipulation. The shoulder will be moved by your healthcare provider. You would be under  general anesthesia (given a drug that puts you to sleep). You would not feel anything. Sometimes the joint will be injected with salt water (saline) at high pressure to break down internal scarring in the joint capsule.  Surgery. This is rarely needed. It may be suggested in advanced cases after all other treatment has failed. PROGNOSIS  In time,  most people recover from adhesive capsulitis. Sometimes, however, the pain goes away but full movement of the shoulder does not return.  HOME CARE INSTRUCTIONS   Take any pain medications recommended by your healthcare provider. Follow the directions carefully.  If you have physical therapy, follow through with the therapist's suggestions. Be sure you understand the exercises you will be doing. You should understand:  How often the exercises should be done.  How many times each exercise should be repeated.  How long they should be done.  What other activities you should do, or not do.  That you should warm up before doing any exercise. Just 5 to 10 minutes will help. Small, gentle movements should get your shoulder ready for more.  Avoid high-demand exercise that involves your shoulder such as throwing. This type of exercise can make pain worse.  Consider using cold packs. Cold may ease swelling and pain. Ask your healthcare provider if a cold pack might help you. If so, get directions on how and when to use them. SEEK MEDICAL CARE IF:   You have any questions about your medications.  Your pain continues to increase. Document Released: 07/11/2009 Document Revised: 12/06/2011 Document Reviewed: 07/11/2009 [facility] Patient Information 2015 [place], [place]. This information is not intended to replace advice given to you by your health care provider. Make sure you discuss any questions you have with your health care provider.

## 2014-12-03 NOTE — Progress Notes (Signed)
Assessment:   1. Essential hypertension - continue medications, DASH diet, exercise and monitor at home. Call if greater than 130/80.  - CBC with Differential/Platelet - BASIC METABOLIC PANEL WITH GFR - Hepatic function panel  2. Mild mitral regurgitation by prior echocardiogram monitor  3. Prediabetes Discussed general issues about diabetes pathophysiology and management., Educational material distributed., Suggested low cholesterol diet., Encouraged aerobic exercise., Discussed foot care., Reminded to get yearly retinal exam. - LOW EXTREMITY NEUR EXAM DOCUM  4. Hyperlipidemia - Lipid panel  5. Hypokalemia Check since going to increase maxide to once a day  6. Hyponatremia monitor  7. Lung nodules Continue follow up Dr. Annamaria Boots, being monitored  8. Depression screen +, grief reaction due to daughters death Declines medications, on xanax, seeing counselor at church and has support from 70 other daughters.   9. Centrilobular emphysema Continue O2 at night and follow up Dr. Annamaria Boots  10. Medication management - Magnesium  11. Vitamin D deficiency - Vit D  25 hydroxy (rtn osteoporosis monitoring)  12. Malnutrition Add ensure/boost  13. Encounter for general adult medical examination with abnormal findings  14. Hypothyroidism, unspecified hypothyroidism type - TSH  15. Adhesive capsulitis, right Continue pain meds, wants referral in May but will call sooner if pain worse - Ambulatory referral to Orthopedic Surgery   Plan:   During the course of the visit the patient was educated and counseled about appropriate screening and preventive services including:    Influenza vaccine  Screening electrocardiogram  Diabetes screening  Glaucoma screening  Nutrition counseling   Advanced directives: has NO advanced directive - not interested in additional information  Conditions/risks identified: BMI: Discussed weight Gain, will add ensure, diet, and increase physical  activity.  Increase physical activity: AHA recommends 150 minutes of physical activity a week.  Medications reviewed DEXA- Due 2016 but patient declines treatment Diabetes is at goal, ACE/ARB therapy: Yes. Depression screening positive due to grief reaction, declines treatment, talking with counselor at church Urinary Incontinence is not an issue: discussed non pharmacology and pharmacology options.  Fall risk: moderate to high- discussed PT, home fall assessment, medications.  + osteoporosis but declines treatment  Subjective:   Andrea [name] is a 81 y.o. female who presents for Medicare Annual Wellness Visit and 3 month follow up on hypertension, prediabetes, hyperlipidemia, vitamin D def.  Date of last medicare wellness visit was 11/19/2013    Her blood pressure has been controlled at home, today their BP is BP: 140/78 mmHg  She does not workout. She denies chest pain, shortness of breath, dizziness.  She is not on cholesterol medication and denies myalgias. Her cholesterol is at goal. The cholesterol last visit was:   Lab Results  Component Value Date   CHOL 204* 08/20/2014   HDL 105 08/20/2014   LDLCALC 80 08/20/2014   TRIG 97 08/20/2014   CHOLHDL 1.9 08/20/2014    She has been working on diet and exercise for prediabetes, and denies paresthesia of the feet, polydipsia, polyuria and visual disturbances. Last A1C in the office was:  Lab Results  Component Value Date   HGBA1C 6.0* 08/20/2014     Patient is on Vitamin D supplement.   She is on thyroid medication. Her medication no changed last visit. .  Lab Results  Component Value Date   TSH 1.236 08/20/2014   She has COPD and follows with Dr. Annamaria Boots, last visit 11/04/2014. She had some edema and weight gain in Sept with a small left pleural effusion after  taking minoxdil but she is doing better. She was suppose to decrease the fluid pill due to kidney function but she is now having swelling bilateral feet, denies  orthopnea, PND, cough.   Her daughter passed 2 weeks ago, she has 4 more but she is very upset, she has taken more of her xanax. Her husband is also getting over pneumonia She saw Dr. Melford Aase for right shoulder denies any injury to her right shoulder, Xray shows chronic rotator cuff tear and DJD, she has restriction to passive and active ROM significantly, still with swelling/eccymosis. Going to daughters funeral in [place] April 2nd, wants the appointment in may.   Medication Review Current Outpatient Prescriptions on File Prior to Visit  Medication Sig Dispense Refill  . ALPRAZolam (XANAX) 0.5 MG tablet Take 1/2 to 1 tablet  2 or 3 x daily as directed for nerves or sleep 90 tablet 3  . amLODipine (NORVASC) 5 MG tablet Take 5 mg by mouth daily.    . Ascorbic Acid (VITAMIN C) 500 MG tablet Take 500 mg by mouth daily.      [name] atenolol (TENORMIN) 100 MG tablet TAKE ONE (1) TABLET BY MOUTH EVERY DAY 90 tablet 99  . cholecalciferol (VITAMIN D) 1000 UNITS tablet Take 1,000 Units by mouth daily.     [name] HYDROcodone-acetaminophen (NORCO) 5-325 MG per tablet Take 1/2 to 1 tablets every 3 to 4 hours as needed for pain 50 tablet 0  . levothyroxine (SYNTHROID, LEVOTHROID) 50 MCG tablet TAKE ONE-HALF TABLET EVERY DAY EXCEPT SUNDAY. 90 tablet 1  . losartan (COZAAR) 100 MG tablet TAKE ONE-HALF IN THE MORNING AND ONE-HALF AT NIGHT 90 tablet 2  . omeprazole (PRILOSEC) 40 MG capsule TAKE ONE CAPSULE BY MOUTH DAILY 90 capsule 2  . triamterene-hydrochlorothiazide (MAXZIDE) 75-50 MG per tablet Take 30 tablets by mouth daily. Take 1/2 to 1 tablet daily As Directed for BP & fluid 30 tablet 99   No current facility-administered medications on file prior to visit.    Current Problems (verified) Patient Active Problem List   Diagnosis Date Noted  . Vitamin D deficiency 01/23/2014  . Medication management 01/23/2014  . CAP (community acquired pneumonia) 10/02/2013  . Acute respiratory failure with hypoxia 10/01/2013   . Hypokalemia 10/01/2013  . Hyponatremia 10/01/2013  . Hyperlipidemia 08/10/2013  . Prediabetes 08/10/2013  . DJD (degenerative joint disease)   . Mild mitral regurgitation by prior echocardiogram   . Essential hypertension 05/08/2010  . COPD with emphysema 05/08/2010  . Lung nodules 05/08/2010    Immunization History  Administered Date(s) Administered  . Influenza Split 07/16/2013  . Influenza,inj,Quad PF,36+ Mos 07/04/2014  . Pneumococcal Conjugate-13 02/26/2014  . Td 06/26/2013   Preventative care: Last colonoscopy: 2008 Last mammogram: 07/06/2013 Last pap smear/pelvic exam: remote DEXA: 07/06/2013 + osteoporosis  Prior vaccinations: TD or Tdap: 2014  Influenza: 06/2013  Pneumococcal: 2008 Prevnar 13: 2015 Shingles/Zostavax: declines Names of Other Physician/Practitioners you currently use: 1. [facility]- here for primary care 2. [facility], eye doctor, last visit unknown 3. unknown, dentist, last visit 2 years Patient Care Team: Unk Pinto, MD as PCP - General (Internal Medicine) Deneise Lever, MD as Consulting Physician (Pulmonary Disease) Lelon Perla, MD as Consulting Physician (Cardiology) Inda Castle, MD as Consulting Physician (Gastroenterology) Rolm Bookbinder, MD as Consulting Physician (Dermatology)  Past Surgical History  Procedure Laterality Date  . Abdominal hysterectomy  1993  . Cataract extraction, bilateral     Allergies  Allergen Reactions  .  Sulfa Antibiotics Other (See Comments)    Unknown   Family History  Problem Relation Age of Onset  . Cancer Father   . Emphysema Sister   . Emphysema Sister   . Emphysema Mother   . Rheumatic fever Sister   . Heart attack Sister   . Diabetes Mother   . Diabetes Sister   . Diabetes Brother   . Asthma Brother     as a child   Risk Factors: Osteoporosis: postmenopausal estrogen deficiency, dietary calcium and/or vitamin D deficiency, anorexia and  amenorrhea History of fracture in the past year: no  Tobacco History  Smoking status  . Former Smoker -- 1.00 packs/day for 20 years  . Types: Cigarettes  . Quit date: 09/27/1974  Smokeless tobacco  . Never Used   MEDICARE WELLNESS OBJECTIVES: Tobacco use: She does not smoke.  Patient is a former smoker. Alcohol Current alcohol use: none Caffeine Current caffeine use: denies use Osteoporosis: postmenopausal estrogen deficiency and dietary calcium and/or vitamin D deficiency, History of fracture in the past year: no Diet: in general, a "healthy" diet   Physical activity: ADLs and sendentary work Depression/mood screen:  Yes - Depression, reactive due to daughters death Hearing: impaired Visual acuity: normal,  does perform annual eye exam  ADLs: self care Fall risk:  High Risk Home safety: fair Cognitive Testing  Alert? Yes  Normal Appearance?Yes  Oriented to person? Yes  Place? Yes   Time? Yes  Recall of three objects?  Yes  Can perform simple calculations? Yes  Displays appropriate judgment?Yes  Can read the correct time from a watch face?Yes EOL planning: No  and Information given   Objective:     Blood pressure 140/78, pulse 76, temperature 97.8 F (36.6 C), resp. rate 16, height _0  (1.6 m), weight 99 lb (44.906 kg). Body mass index is 17.54 kg/(m^2).  Wt Readings from Last 3 Encounters:  12/03/14 99 lb (44.906 kg)  11/06/14 10 lb 6.4 oz (4.717 kg)  11/04/14 99 lb 9.6 oz (45.178 kg)    General appearance: alert, cachetic, no distress, WD/WN,  female HEENT: normocephalic, sclerae anicteric, TMs pearly, nares patent, no discharge or erythema, pharynx normal Oral cavity: MMM, no lesions Neck: supple, no lymphadenopathy, no thyromegaly, no masses Heart: RRR, normal S1, S2, no murmurs Lungs: CTA bilaterally, decreased breath sounds, no wheezes, rhonchi, or rales Abdomen: +bs, soft, non tender, non distended, no masses, no hepatomegaly, no  splenomegaly Musculoskeletal: Right shoulder with swelling, ecchymosis, no redness/warmth, + tenderness at [facility] joint and subacromial, + severe decreased ROM to passive and active abduction to 60 degrees.  Extremities: 1-2+ edema, no cyanosis, no clubbing Pulses: 2+ symmetric, upper and lower extremities, normal cap refill Neurological: alert, oriented x 3, CN2-12 intact, strength normal upper extremities and lower extremities, sensation normal throughout, DTRs 2+ throughout. Psychiatric: normal affect, behavior normal, crying when talking about her daughter.  Breast: defer Gyn: defer Rectal: defer  Medicare Attestation I have personally reviewed: The patient's medical and social history Their use of alcohol, tobacco or illicit drugs Their current medications and supplements The patient's functional ability including ADLs,fall risks, home safety risks, cognitive, and hearing and visual impairment Diet and physical activities Evidence for depression or mood disorders  The patient's weight, height, BMI, and visual acuity have been recorded in the chart.  I have made referrals, counseling, and provided education to the patient based on review of the above and I have provided the patient with a written personalized care plan  for preventive services.     Vicie Mutters, PA-C   12/03/2014

## 2014-12-04 ENCOUNTER — Other Ambulatory Visit: Payer: Self-pay | Admitting: Physician Assistant

## 2014-12-04 DIAGNOSIS — J449 Chronic obstructive pulmonary disease, unspecified: Secondary | ICD-10-CM | POA: Diagnosis not present

## 2014-12-04 LAB — CBC WITH DIFFERENTIAL/PLATELET
BASOS PCT: 0 % (ref 0–1)
Basophils Absolute: 0 10*3/uL (ref 0.0–0.1)
EOS ABS: 0.2 10*3/uL (ref 0.0–0.7)
Eosinophils Relative: 3 % (ref 0–5)
HEMATOCRIT: 36.6 % (ref 36.0–46.0)
Hemoglobin: 11.7 g/dL — ABNORMAL LOW (ref 12.0–15.0)
LYMPHS ABS: 1.2 10*3/uL (ref 0.7–4.0)
Lymphocytes Relative: 20 % (ref 12–46)
MCH: 29.9 pg (ref 26.0–34.0)
MCHC: 32 g/dL (ref 30.0–36.0)
MCV: 93.6 fL (ref 78.0–100.0)
MPV: 10.6 fL (ref 8.6–12.4)
Monocytes Absolute: 0.5 10*3/uL (ref 0.1–1.0)
Monocytes Relative: 8 % (ref 3–12)
NEUTROS ABS: 4.1 10*3/uL (ref 1.7–7.7)
NEUTROS PCT: 69 % (ref 43–77)
Platelets: 311 10*3/uL (ref 150–400)
RBC: 3.91 MIL/uL (ref 3.87–5.11)
RDW: 12.5 % (ref 11.5–15.5)
WBC: 6 10*3/uL (ref 4.0–10.5)

## 2014-12-04 LAB — HEPATIC FUNCTION PANEL
ALT: 12 U/L (ref 0–35)
AST: 20 U/L (ref 0–37)
Albumin: 3.7 g/dL (ref 3.5–5.2)
Alkaline Phosphatase: 83 U/L (ref 39–117)
Bilirubin, Direct: 0.1 mg/dL (ref 0.0–0.3)
Indirect Bilirubin: 0.2 mg/dL (ref 0.2–1.2)
Total Bilirubin: 0.3 mg/dL (ref 0.2–1.2)
Total Protein: 6.8 g/dL (ref 6.0–8.3)

## 2014-12-04 LAB — BASIC METABOLIC PANEL WITH GFR
BUN: 19 mg/dL (ref 6–23)
CHLORIDE: 99 meq/L (ref 96–112)
CO2: 32 mEq/L (ref 19–32)
Calcium: 8.9 mg/dL (ref 8.4–10.5)
Creat: 1 mg/dL (ref 0.50–1.10)
GFR, EST AFRICAN AMERICAN: 60 mL/min
GFR, EST NON AFRICAN AMERICAN: 52 mL/min — AB
GLUCOSE: 99 mg/dL (ref 70–99)
Potassium: 3.8 mEq/L (ref 3.5–5.3)
SODIUM: 139 meq/L (ref 135–145)

## 2014-12-04 LAB — LIPID PANEL
Cholesterol: 182 mg/dL (ref 0–200)
HDL: 56 mg/dL (ref >=46)
LDL Cholesterol: 109 mg/dL — ABNORMAL HIGH (ref 0–99)
Total CHOL/HDL Ratio: 3.3 Ratio
Triglycerides: 84 mg/dL (ref <150)
VLDL: 17 mg/dL (ref 0–40)

## 2014-12-04 LAB — TSH: TSH: 1.144 u[IU]/mL (ref 0.350–4.500)

## 2014-12-04 LAB — MAGNESIUM: MAGNESIUM: 1.6 mg/dL (ref 1.5–2.5)

## 2014-12-04 LAB — VITAMIN D 25 HYDROXY (VIT D DEFICIENCY, FRACTURES): Vit D, 25-Hydroxy: 39 ng/mL (ref 30–100)

## 2014-12-05 ENCOUNTER — Other Ambulatory Visit: Payer: Self-pay

## 2014-12-05 DIAGNOSIS — M25511 Pain in right shoulder: Secondary | ICD-10-CM

## 2014-12-09 ENCOUNTER — Other Ambulatory Visit: Payer: Self-pay | Admitting: Physician Assistant

## 2014-12-09 ENCOUNTER — Ambulatory Visit: Payer: Self-pay | Admitting: Internal Medicine

## 2014-12-09 DIAGNOSIS — Z961 Presence of intraocular lens: Secondary | ICD-10-CM | POA: Diagnosis not present

## 2014-12-09 DIAGNOSIS — Z87891 Personal history of nicotine dependence: Secondary | ICD-10-CM | POA: Diagnosis not present

## 2014-12-09 DIAGNOSIS — H1789 Other corneal scars and opacities: Secondary | ICD-10-CM | POA: Diagnosis not present

## 2014-12-09 DIAGNOSIS — Z9889 Other specified postprocedural states: Secondary | ICD-10-CM | POA: Diagnosis not present

## 2014-12-09 DIAGNOSIS — I10 Essential (primary) hypertension: Secondary | ICD-10-CM | POA: Diagnosis not present

## 2014-12-09 DIAGNOSIS — H1859 Other hereditary corneal dystrophies: Secondary | ICD-10-CM | POA: Diagnosis not present

## 2014-12-09 DIAGNOSIS — H04123 Dry eye syndrome of bilateral lacrimal glands: Secondary | ICD-10-CM | POA: Diagnosis not present

## 2014-12-09 DIAGNOSIS — Z79899 Other long term (current) drug therapy: Secondary | ICD-10-CM | POA: Diagnosis not present

## 2014-12-10 ENCOUNTER — Telehealth: Payer: Self-pay | Admitting: Internal Medicine

## 2014-12-10 NOTE — Telephone Encounter (Signed)
Called and spoke to rep at [facility]. They are needing another order faxed stating the liter flow as there are discrepancies at their office. Order from 2/11 and latest OV notes faxed to [facility]. Called and made pt aware. Nothing further needed.

## 2014-12-13 DIAGNOSIS — M12811 Other specific arthropathies, not elsewhere classified, right shoulder: Secondary | ICD-10-CM | POA: Diagnosis not present

## 2014-12-16 DIAGNOSIS — H1859 Other hereditary corneal dystrophies: Secondary | ICD-10-CM | POA: Diagnosis not present

## 2015-01-04 DIAGNOSIS — J449 Chronic obstructive pulmonary disease, unspecified: Secondary | ICD-10-CM | POA: Diagnosis not present

## 2015-01-24 ENCOUNTER — Encounter: Payer: Self-pay | Admitting: Gastroenterology

## 2015-02-03 DIAGNOSIS — J449 Chronic obstructive pulmonary disease, unspecified: Secondary | ICD-10-CM | POA: Diagnosis not present

## 2015-02-10 ENCOUNTER — Other Ambulatory Visit: Payer: Self-pay | Admitting: *Deleted

## 2015-02-10 MED ORDER — LEVOTHYROXINE SODIUM 50 MCG PO TABS: ORAL_TABLET | ORAL | Status: DC

## 2015-02-13 ENCOUNTER — Other Ambulatory Visit: Payer: Self-pay | Admitting: Internal Medicine

## 2015-03-06 DIAGNOSIS — J449 Chronic obstructive pulmonary disease, unspecified: Secondary | ICD-10-CM | POA: Diagnosis not present

## 2015-04-05 DIAGNOSIS — J449 Chronic obstructive pulmonary disease, unspecified: Secondary | ICD-10-CM | POA: Diagnosis not present

## 2015-04-09 ENCOUNTER — Other Ambulatory Visit: Payer: Self-pay | Admitting: Internal Medicine

## 2015-04-11 DIAGNOSIS — M12811 Other specific arthropathies, not elsewhere classified, right shoulder: Secondary | ICD-10-CM | POA: Diagnosis not present

## 2015-05-05 ENCOUNTER — Ambulatory Visit: Payer: Medicare Other | Admitting: Internal Medicine

## 2015-05-06 ENCOUNTER — Encounter: Payer: Self-pay | Admitting: Internal Medicine

## 2015-05-06 ENCOUNTER — Ambulatory Visit (INDEPENDENT_AMBULATORY_CARE_PROVIDER_SITE_OTHER): Payer: Medicare Other | Admitting: Internal Medicine

## 2015-05-06 VITALS — BP 126/78 | HR 72 | Ht 63.0 in | Wt 99.0 lb

## 2015-05-06 DIAGNOSIS — R918 Other nonspecific abnormal finding of lung field: Secondary | ICD-10-CM | POA: Diagnosis not present

## 2015-05-06 DIAGNOSIS — J432 Centrilobular emphysema: Secondary | ICD-10-CM | POA: Diagnosis not present

## 2015-05-06 DIAGNOSIS — J449 Chronic obstructive pulmonary disease, unspecified: Secondary | ICD-10-CM | POA: Diagnosis not present

## 2015-05-06 NOTE — Patient Instructions (Signed)
We can continue O2 2L for sleep/ APS    Please call if we can help

## 2015-05-06 NOTE — Progress Notes (Signed)
12/18/13- 60 yoF former smoker [name]; sent back by [facility] Dr Melford Aase.   For reassessment of COPD  History of lung nodules which were stable at last visit 05/08/2010 with CT chest 05/12/2010. Known emphysema and stable subcentimeter bilateral pulmonary nodules. Several episodes of pneumonia. Has used oxygen for sleep. Paces herself during the day and denies cough while recognizing dyspnea on exertion with brisk walking. History of mild valvular heart disease, hypertension, irregular heartbeat. No history of TB. Lives with husband CXR 10/01/13 IMPRESSION:  Emphysema with airspace disease in the right base suggesting  pneumonia.  Electronically Signed  By: Misty Stanley M.D.  On: 10/01/2013 12:49  02/28/14-  66 yoF former smoker followed for COPD, hx lung nodules,  complicated by HBP, mild valvular heart disease, hypertension, irregular heartbeat FOLLOWS FOR: Pt states breathing has improved since last visit. Pt states she has an increase in energy. C/o mild SOB with activity. C/o dry cough at night. Denies CP/tightness.  Postnasal drip. Avoids antihistamines which overdry her eyes. Uses no respiratory meds. Sleeps with oxygen 2 L started by hospital-she questions need. Walks treadmill at gym  +will need PFT+  07/04/14- 84 yoF former smoker followed for COPD, hx lung nodules,  complicated by HBP, mild valvular heart disease, hypertension, irregular heartbeat FOLLOW FOR: Pulmonary Emphysema; sometimes gets tickle in throat, but no cough, no chest tightness, no complaints O2 2L/ Advanced CXR 06/06/14 IMPRESSION:  1. Small left pleural effusion.  2. COPD.  Electronically Signed  By: Kathreen Devoid  On: 06/06/2014 15:06  11/04/14- 85 yoF former smoker followed for COPD, hx lung nodules,  complicated by HBP, mild valvular heart disease, hypertension, irregular heartbeat.  daughter here FOLLOWS FOR: "always stays SOB". Otherwise doing well overall. Husband has a cold but  she hopes she will catch it. Has home oxygen since pneumonia a year ago, 2 L for sleep/APS.  05/06/15-85 yoF former smoker followed for COPD, lung nodules,  complicated by HBP, mild valvular heart disease, hypertension, irregular heartbeat.  daughter here FOLLOWS FOR: Pt denies any wheezing but SOB if in a hurry. Cough in am at times- no color (usually does not ch for color.  O2 2 L sleep/APS remains necessary.  ROS-see HPI Constitutional:   No-   weight loss, night sweats, fevers, chills, fatigue, lassitude. HEENT:   No-  headaches, difficulty swallowing, tooth/dental problems, sore throat,       No-  sneezing, itching, ear ache, nasal congestion, post nasal drip,  CV:  No-   chest pain, orthopnea, PND, swelling in lower extremities, anasarca,                                                     dizziness, palpitations Resp: +shortness of breath with exertion or at rest.              No-   productive cough,  No non-productive cough,  No- coughing up of blood.              No-   change in color of mucus.  No- wheezing.   Skin: No-   rash or lesions. GI:  No-   heartburn, indigestion, abdominal pain, nausea, vomiting,  GU:  MS:  No-   joint pain or swelling.  . Neuro-     nothing unusual Psych:  No- change  in mood or affect. No depression or anxiety.  No memory loss.  OBJ- Physical Exam   General- Alert, Oriented, Affect-appropriate, Distress- none acute. Slender Skin- rash-none, lesions- none, excoriation- none Lymphadenopathy- none Head- atraumatic            Eyes- Gross vision intact, PERRLA, conjunctivae and secretions clear            Ears- +cerumen            Nose- Clear, no-Septal dev, mucus, polyps, erosion, perforation             Throat- Mallampati II , mucosa clear , drainage- none, tonsils- atrophic. Own teeth Neck- flexible , trachea midline, no stridor , thyroid nl, carotid no bruit Chest - symmetrical excursion , unlabored           Heart/CV- RRR , no murmur , no gallop   , no rub, nl s1 s2                           - JVD- none , edema- none, stasis changes- none, varices- none           Lung- distant/clear to P&A, wheeze- none, cough- none , dullness-none, rub- none           Chest wall-  Abd-  Br/ Gen/ Rectal- Not done, not indicated Extrem- cyanosis- none, clubbing, none, atrophy- none, strength- nl Neuro- grossly intact to observation

## 2015-05-07 NOTE — Assessment & Plan Note (Signed)
Nodules not noted and therefore presumed not increased in size as of chest x-ray 06/06/2014. They had been considered benign based on stability as of CT scan in 2011.

## 2015-05-07 NOTE — Assessment & Plan Note (Signed)
Remains oxygen dependent at night and feels more secure wearing her oxygen so doesn't try without it. Daytime exercise tolerance is stable and realistic for her age. Family feels she is well controlled. I suggested she could be followed for ongoing care by her primary physician. We will be happy to see her again if needed.

## 2015-06-03 ENCOUNTER — Other Ambulatory Visit: Payer: Self-pay | Admitting: Internal Medicine

## 2015-06-06 DIAGNOSIS — J449 Chronic obstructive pulmonary disease, unspecified: Secondary | ICD-10-CM | POA: Diagnosis not present

## 2015-06-11 DIAGNOSIS — H04123 Dry eye syndrome of bilateral lacrimal glands: Secondary | ICD-10-CM | POA: Diagnosis not present

## 2015-06-11 DIAGNOSIS — Z961 Presence of intraocular lens: Secondary | ICD-10-CM | POA: Diagnosis not present

## 2015-06-25 ENCOUNTER — Encounter: Payer: Self-pay | Admitting: Internal Medicine

## 2015-06-25 ENCOUNTER — Ambulatory Visit (INDEPENDENT_AMBULATORY_CARE_PROVIDER_SITE_OTHER): Payer: Medicare Other | Admitting: Internal Medicine

## 2015-06-25 VITALS — BP 176/74 | HR 68 | Temp 98.0°F | Resp 16 | Ht 63.0 in | Wt 102.0 lb

## 2015-06-25 DIAGNOSIS — R7309 Other abnormal glucose: Secondary | ICD-10-CM | POA: Diagnosis not present

## 2015-06-25 DIAGNOSIS — E782 Mixed hyperlipidemia: Secondary | ICD-10-CM

## 2015-06-25 DIAGNOSIS — Z79899 Other long term (current) drug therapy: Secondary | ICD-10-CM

## 2015-06-25 DIAGNOSIS — I10 Essential (primary) hypertension: Secondary | ICD-10-CM

## 2015-06-25 DIAGNOSIS — R7303 Prediabetes: Secondary | ICD-10-CM

## 2015-06-25 DIAGNOSIS — Z23 Encounter for immunization: Secondary | ICD-10-CM | POA: Diagnosis not present

## 2015-06-25 DIAGNOSIS — E559 Vitamin D deficiency, unspecified: Secondary | ICD-10-CM

## 2015-06-25 LAB — BASIC METABOLIC PANEL WITH GFR
BUN: 22 mg/dL (ref 7–25)
CO2: 34 mmol/L — ABNORMAL HIGH (ref 20–31)
Calcium: 9.7 mg/dL (ref 8.6–10.4)
Chloride: 93 mmol/L — ABNORMAL LOW (ref 98–110)
Creat: 0.82 mg/dL (ref 0.60–0.88)
GFR, EST AFRICAN AMERICAN: 76 mL/min (ref >=60)
GFR, Est Non African American: 66 mL/min (ref >=60)
GLUCOSE: 82 mg/dL (ref 65–99)
POTASSIUM: 4.2 mmol/L (ref 3.5–5.3)
SODIUM: 133 mmol/L — AB (ref 135–146)

## 2015-06-25 LAB — CBC WITH DIFFERENTIAL/PLATELET
BASOS ABS: 0 10*3/uL (ref 0.0–0.1)
Basophils Relative: 0 % (ref 0–1)
Eosinophils Absolute: 0.1 10*3/uL (ref 0.0–0.7)
Eosinophils Relative: 2 % (ref 0–5)
HEMATOCRIT: 36.4 % (ref 36.0–46.0)
Hemoglobin: 12 g/dL (ref 12.0–15.0)
LYMPHS PCT: 20 % (ref 12–46)
Lymphs Abs: 1.4 10*3/uL (ref 0.7–4.0)
MCH: 29.9 pg (ref 26.0–34.0)
MCHC: 33 g/dL (ref 30.0–36.0)
MCV: 90.8 fL (ref 78.0–100.0)
MPV: 11.2 fL (ref 8.6–12.4)
Monocytes Absolute: 0.6 10*3/uL (ref 0.1–1.0)
Monocytes Relative: 9 % (ref 3–12)
Neutro Abs: 4.7 10*3/uL (ref 1.7–7.7)
Neutrophils Relative %: 69 % (ref 43–77)
PLATELETS: 202 10*3/uL (ref 150–400)
RBC: 4.01 MIL/uL (ref 3.87–5.11)
RDW: 12.9 % (ref 11.5–15.5)
WBC: 6.8 10*3/uL (ref 4.0–10.5)

## 2015-06-25 LAB — HEPATIC FUNCTION PANEL
ALK PHOS: 76 U/L (ref 33–130)
ALT: 9 U/L (ref 6–29)
AST: 22 U/L (ref 10–35)
Albumin: 4 g/dL (ref 3.6–5.1)
BILIRUBIN INDIRECT: 0.5 mg/dL (ref 0.2–1.2)
Bilirubin, Direct: 0.1 mg/dL (ref <=0.2)
Total Bilirubin: 0.6 mg/dL (ref 0.2–1.2)
Total Protein: 7.1 g/dL (ref 6.1–8.1)

## 2015-06-25 LAB — LIPID PANEL
CHOLESTEROL: 219 mg/dL — AB (ref 125–200)
HDL: 85 mg/dL (ref >=46)
LDL CALC: 121 mg/dL (ref <130)
TRIGLYCERIDES: 65 mg/dL (ref <150)
Total CHOL/HDL Ratio: 2.6 Ratio (ref <=5.0)
VLDL: 13 mg/dL (ref <30)

## 2015-06-25 LAB — MAGNESIUM: Magnesium: 1.7 mg/dL (ref 1.5–2.5)

## 2015-06-25 NOTE — Progress Notes (Signed)
Patient ID: Andrea [name], female   DOB: [birth year], 81 y.o.   MRN: [id number]  Assessment and Plan:  Hypertension:  -Continue medication, didn't take morning dose today -if bp consistently 150/90 or greater at home patient to increase to full amlodipine -monitor blood pressure at home.  -Continue DASH diet.   -Reminder to go to the ER if any CP, SOB, nausea, dizziness, severe HA, changes vision/speech, left arm numbness and tingling, and jaw pain.  Cholesterol: -Continue diet and exercise.  -Check cholesterol.   Pre-diabetes: -Continue diet and exercise.  -Check A1C  Vitamin D Def: -check level -continue medications.   Bullae on the leg -drained here in the office -doxycycline for possible infection.  Continue diet and meds as discussed. Further disposition pending results of labs.  HPI 81 y.o. female  presents for 3 month follow up with hypertension, hyperlipidemia, prediabetes and vitamin D.   Her blood pressure has not been controlled at home, today their BP is BP: (!) 176/74 mmHg.   She does not workout. She denies chest pain, shortness of breath, dizziness.   She is on cholesterol medication and denies myalgias. Her cholesterol is at goal. The cholesterol last visit was:   Lab Results  Component Value Date   CHOL 182 12/03/2014   HDL 56 12/03/2014   LDLCALC 109* 12/03/2014   TRIG 84 12/03/2014   CHOLHDL 3.3 12/03/2014     She has been working on diet and exercise for prediabetes, and denies foot ulcerations, hyperglycemia, hypoglycemia , increased appetite, nausea, paresthesia of the feet, polydipsia, polyuria, visual disturbances, vomiting and weight loss. Last A1C in the office was:  Lab Results  Component Value Date   HGBA1C 6.0* 08/20/2014    Patient is on Vitamin D supplement.  Lab Results  Component Value Date   VD25OH 39 12/03/2014     Patient did notice large blister on the right thigh.  It has been there 2 days.  No pain, no drainage, no fever,  chills, nausea or vomiting.    Current Medications:  Current Outpatient Prescriptions on File Prior to Visit  Medication Sig Dispense Refill  . ALPRAZolam (XANAX) 0.5 MG tablet TAKE ONE-HALF TO ONE TABLET 2 TO 3 TIMESDAILY FOR NERVES OR SLEEP MAYCAUSE DROWSINESS 90 tablet 4  . amLODipine (NORVASC) 5 MG tablet TAKE ONE-HALF TABLET AS NEEDED IF BLOOD PRESSURE IS GREATER THAN 150/90 (Patient taking differently: take 1/2 tablet qd) 90 tablet 1  . Ascorbic Acid (VITAMIN C) 500 MG tablet Take 500 mg by mouth daily.      [name] atenolol (TENORMIN) 100 MG tablet TAKE ONE (1) TABLET BY MOUTH EVERY DAY 90 tablet 99  . cholecalciferol (VITAMIN D) 1000 UNITS tablet Take 1,000 Units by mouth daily.     [name] levothyroxine (SYNTHROID, LEVOTHROID) 50 MCG tablet Take 1/2 tablet 5 days a week and 1 tablet on Friday and Sunday or as directed. 90 tablet 1  . losartan (COZAAR) 100 MG tablet TAKE ONE-HALF TABLET IN THE MORNING AND ONE-HALF TABLET AT NIGHT 90 tablet 3  . omeprazole (PRILOSEC) 40 MG capsule TAKE ONE CAPSULE BY MOUTH DAILY 90 capsule 2  . triamterene-hydrochlorothiazide (MAXZIDE) 75-50 MG per tablet TAKE 1/2 TO ONE TABLET ONCE DAILY FOR BLOOD PRESSURE AND FLUID 30 tablet 1   No current facility-administered medications on file prior to visit.    Medical History:  Past Medical History  Diagnosis Date  . Palpitations   . HLD (hyperlipidemia)   . DJD (degenerative joint disease)   .  Mild mitral regurgitation by prior echocardiogram   . Mild tricuspid regurgitation   . HTN (hypertension)     Allergies:  Allergies  Allergen Reactions  . Sulfa Antibiotics Other (See Comments)    Unknown     Review of Systems:  Review of Systems  Constitutional: Negative for fever, chills and malaise/fatigue.  HENT: Negative for congestion, ear discharge and sore throat.   Respiratory: Negative for cough, shortness of breath and wheezing.   Cardiovascular: Negative for chest pain, palpitations and leg swelling.   Gastrointestinal: Negative for diarrhea, constipation, blood in stool and melena.  Genitourinary: Negative.   Skin: Negative.   Neurological: Negative for dizziness, sensory change, loss of consciousness and headaches.  Psychiatric/Behavioral: Negative for depression. The patient is not nervous/anxious and does not have insomnia.     Family history- Review and unchanged  Social history- Review and unchanged  Physical Exam: BP 176/74 mmHg  Pulse 68  Temp(Src) 98 F (36.7 C) (Temporal)  Resp 16  Ht _0  (1.6 m)  Wt 102 lb (46.267 kg)  BMI 18.07 kg/m2 Wt Readings from Last 3 Encounters:  06/25/15 102 lb (46.267 kg)  05/06/15 99 lb (44.906 kg)  12/03/14 99 lb (44.906 kg)    General Appearance: Well nourished well developed, in no apparent distress. Eyes: PERRLA, EOMs, conjunctiva no swelling or erythema ENT/Mouth: Ear canals normal without obstruction, swelling, erythma, discharge.  TMs normal bilaterally.  Oropharynx moist, clear, without exudate, or postoropharyngeal swelling. Neck: Supple, thyroid normal,no cervical adenopathy  Respiratory: Respiratory effort normal, Breath sounds clear A&P without rhonchi, wheeze, or rale.  No retractions, no accessory usage. Cardio: RRR with no MRGs. Brisk peripheral pulses without edema.  Abdomen: Soft, + BS,  Non tender, no guarding, rebound, hernias, masses. Musculoskeletal: Full ROM, 5/5 strength, Normal gait Skin: Warm, dry without rashes, lesions, ecchymosis.  Large 3 cm bullae with no tenderness to palpation.  Clear serous in appearance.    Neuro: Awake and oriented X 3, Cranial nerves intact. Normal muscle tone, no cerebellar symptoms. Psych: Normal affect, Insight and Judgment appropriate.    Starlyn Skeans, PA-C 2:17 PM [facility]

## 2015-06-26 LAB — TSH: TSH: 1.781 u[IU]/mL (ref 0.350–4.500)

## 2015-06-26 LAB — VITAMIN D 25 HYDROXY (VIT D DEFICIENCY, FRACTURES): Vit D, 25-Hydroxy: 45 ng/mL (ref 30–100)

## 2015-06-26 LAB — HEMOGLOBIN A1C
Hgb A1c MFr Bld: 5.7 % — ABNORMAL HIGH (ref <5.7)
Mean Plasma Glucose: 117 mg/dL — ABNORMAL HIGH (ref <117)

## 2015-06-26 LAB — INSULIN, RANDOM: INSULIN: 1.8 u[IU]/mL — AB (ref 2.0–19.6)

## 2015-06-27 ENCOUNTER — Other Ambulatory Visit: Payer: Self-pay | Admitting: Internal Medicine

## 2015-06-27 MED ORDER — DOXYCYCLINE HYCLATE 100 MG PO CAPS: 100.0000 mg | ORAL_CAPSULE | Freq: Two times a day (BID) | ORAL | Status: DC

## 2015-06-27 MED ORDER — CIPROFLOXACIN HCL 500 MG PO TABS: 500.0000 mg | ORAL_TABLET | Freq: Two times a day (BID) | ORAL | Status: DC

## 2015-07-06 DIAGNOSIS — J449 Chronic obstructive pulmonary disease, unspecified: Secondary | ICD-10-CM | POA: Diagnosis not present

## 2015-07-14 DIAGNOSIS — H5203 Hypermetropia, bilateral: Secondary | ICD-10-CM | POA: Diagnosis not present

## 2015-08-06 DIAGNOSIS — J449 Chronic obstructive pulmonary disease, unspecified: Secondary | ICD-10-CM | POA: Diagnosis not present

## 2015-08-09 ENCOUNTER — Other Ambulatory Visit: Payer: Self-pay | Admitting: Internal Medicine

## 2015-08-26 NOTE — Progress Notes (Signed)
HPI: FU palpitations and hypertension. Nuclear study in October 2009 revealed an ejection fraction of 73% and normal perfusion. CardioNet in February of 2014 showed sinus rhythm with PACs, PVCs, occasional junctional escape beats and pauses greater than 3 seconds. Cardizem was discontinued and Norvasc added. Echocardiogram May 2014 showed normal LV function, mild to moderate aortic insufficiency, mild mitral regurgitation and moderate tricuspid regurgitation. She was last seen in 2014. Since last seen, There is no dyspnea, chest pain or syncope. Her palpitations have worsened. Current Outpatient Prescriptions  Medication Sig Dispense Refill  . ALPRAZolam (XANAX) 0.5 MG tablet TAKE ONE-HALF TO ONE TABLET 2 TO 3 TIMESDAILY FOR NERVES OR SLEEP MAYCAUSE DROWSINESS 90 tablet 4  . amLODipine (NORVASC) 5 MG tablet TAKE ONE-HALF TABLET AS NEEDED IF BLOOD PRESSURE IS GREATER THAN 150/90 (Patient taking differently: take 1/2 tablet qd) 90 tablet 1  . Ascorbic Acid (VITAMIN C) 500 MG tablet Take 500 mg by mouth daily.      [name] atenolol (TENORMIN) 100 MG tablet TAKE ONE TABLET ONCE DAILY 90 tablet 1  . cholecalciferol (VITAMIN D) 1000 UNITS tablet Take 1,000 Units by mouth daily.     [name] levothyroxine (SYNTHROID, LEVOTHROID) 50 MCG tablet Take 1/2 tablet 5 days a week and 1 tablet on Friday and Sunday or as directed. 90 tablet 1  . losartan (COZAAR) 100 MG tablet TAKE ONE-HALF TABLET IN THE MORNING AND ONE-HALF TABLET AT NIGHT 90 tablet 3  . omeprazole (PRILOSEC) 40 MG capsule TAKE ONE CAPSULE BY MOUTH DAILY 90 capsule 2  . triamterene-hydrochlorothiazide (MAXZIDE) 75-50 MG per tablet TAKE 1/2 TO ONE TABLET ONCE DAILY FOR BLOOD PRESSURE AND FLUID (Patient taking differently: TAKE 1/2 TO ONE TABLET 2-3 TIMES WEEKLY.) 30 tablet 1   No current facility-administered medications for this visit.     Past Medical History  Diagnosis Date  . Palpitations   . HLD (hyperlipidemia)   . DJD (degenerative joint  disease)   . Mild mitral regurgitation by prior echocardiogram   . Mild tricuspid regurgitation   . HTN (hypertension)     Past Surgical History  Procedure Laterality Date  . Abdominal hysterectomy  1993  . Cataract extraction, bilateral      Social History   Social History  . Marital Status: Married    Spouse Name: N/A  . Number of Children: 5  . Years of Education: N/A   Occupational History  . retired    Social History Main Topics  . Smoking status: Former Smoker -- 1.00 packs/day for 20 years    Types: Cigarettes    Quit date: 09/27/1974  . Smokeless tobacco: Never Used  . Alcohol Use: No  . Drug Use: No  . Sexual Activity: No   Other Topics Concern  . Not on file   Social History Narrative    ROS: no fevers or chills, productive cough, hemoptysis, dysphasia, odynophagia, melena, hematochezia, dysuria, hematuria, rash, seizure activity, orthopnea, PND, pedal edema, claudication. Remaining systems are negative.  Physical Exam: Well-developed frail in no acute distress.  Skin is warm and dry.  HEENT is normal.  Neck is supple.  Chest is clear to auscultation with normal expansion.  Cardiovascular exam is irregular Abdominal exam nontender or distended. No masses palpated. Extremities show no edema. neuro grossly intact  ECG Sinus rhythm with runs of PAT. Normal axis. Probable precordial lead reversal. Nonspecific T-wave changes.

## 2015-09-01 ENCOUNTER — Encounter: Payer: Self-pay | Admitting: Physician Assistant

## 2015-09-04 ENCOUNTER — Ambulatory Visit (INDEPENDENT_AMBULATORY_CARE_PROVIDER_SITE_OTHER): Payer: Medicare Other | Admitting: Cardiology

## 2015-09-04 ENCOUNTER — Encounter: Payer: Self-pay | Admitting: Cardiology

## 2015-09-04 ENCOUNTER — Encounter: Payer: Self-pay | Admitting: *Deleted

## 2015-09-04 VITALS — BP 174/80 | HR 85 | Ht 63.0 in | Wt 99.0 lb

## 2015-09-04 DIAGNOSIS — I351 Nonrheumatic aortic (valve) insufficiency: Secondary | ICD-10-CM | POA: Insufficient documentation

## 2015-09-04 DIAGNOSIS — R002 Palpitations: Secondary | ICD-10-CM | POA: Diagnosis not present

## 2015-09-04 DIAGNOSIS — I10 Essential (primary) hypertension: Secondary | ICD-10-CM | POA: Diagnosis not present

## 2015-09-04 MED ORDER — AMLODIPINE BESYLATE 5 MG PO TABS: 5.0000 mg | ORAL_TABLET | Freq: Every day | ORAL | Status: DC

## 2015-09-04 MED ORDER — AMLODIPINE BESYLATE 5 MG PO TABS: 5.0000 mg | ORAL_TABLET | Freq: Two times a day (BID) | ORAL | Status: DC

## 2015-09-04 NOTE — Patient Instructions (Signed)
Medication Instructions:   INCREASE AMLODIPINE TO 5 MG DAILY  Testing/Procedures:  Your physician has requested that you have an echocardiogram. Echocardiography is a painless test that uses sound waves to create images of your heart. It provides your doctor with information about the size and shape of your heart and how well your heart's chambers and valves are working. This procedure takes approximately one hour. There are no restrictions for this procedure.   Your physician has recommended that you wear an event monitor. Event monitors are medical devices that record the heart's electrical activity. Doctors most often [place] these monitors to diagnose arrhythmias. Arrhythmias are problems with the speed or rhythm of the heartbeat. The monitor is a small, portable device. You can wear one while you do your normal daily activities. This is usually used to diagnose what is causing palpitations/syncope (passing out).AT [id number]:  Your physician recommends that you schedule a follow-up appointment in: [facility]   Any Other Special Instructions Will Be Listed Below (If Applicable).

## 2015-09-04 NOTE — Assessment & Plan Note (Addendum)
Blood pressure elevated.Increase amlodipine to 5 mg daily and follow.

## 2015-09-04 NOTE — Assessment & Plan Note (Signed)
Plan follow-up echocardiogram.

## 2015-09-04 NOTE — Assessment & Plan Note (Signed)
Patient's palpitations have worsened.We will arrange an event monitor to make sure she has not developed atrial fibrillation or other arrhythmias.

## 2015-09-05 DIAGNOSIS — L570 Actinic keratosis: Secondary | ICD-10-CM | POA: Diagnosis not present

## 2015-09-05 DIAGNOSIS — Z85828 Personal history of other malignant neoplasm of skin: Secondary | ICD-10-CM | POA: Diagnosis not present

## 2015-09-05 DIAGNOSIS — L821 Other seborrheic keratosis: Secondary | ICD-10-CM | POA: Diagnosis not present

## 2015-09-05 DIAGNOSIS — J449 Chronic obstructive pulmonary disease, unspecified: Secondary | ICD-10-CM | POA: Diagnosis not present

## 2015-09-05 DIAGNOSIS — L814 Other melanin hyperpigmentation: Secondary | ICD-10-CM | POA: Diagnosis not present

## 2015-09-05 DIAGNOSIS — C44729 Squamous cell carcinoma of skin of left lower limb, including hip: Secondary | ICD-10-CM | POA: Diagnosis not present

## 2015-09-06 ENCOUNTER — Other Ambulatory Visit: Payer: Self-pay | Admitting: Internal Medicine

## 2015-09-25 ENCOUNTER — Ambulatory Visit (INDEPENDENT_AMBULATORY_CARE_PROVIDER_SITE_OTHER): Payer: Medicare Other | Admitting: Physician Assistant

## 2015-09-25 ENCOUNTER — Other Ambulatory Visit: Payer: Self-pay

## 2015-09-25 ENCOUNTER — Encounter: Payer: Self-pay | Admitting: Physician Assistant

## 2015-09-25 VITALS — BP 170/68 | HR 65 | Temp 98.1°F | Resp 16 | Ht 63.0 in | Wt 102.0 lb

## 2015-09-25 DIAGNOSIS — I351 Nonrheumatic aortic (valve) insufficiency: Secondary | ICD-10-CM

## 2015-09-25 DIAGNOSIS — M810 Age-related osteoporosis without current pathological fracture: Secondary | ICD-10-CM

## 2015-09-25 DIAGNOSIS — I10 Essential (primary) hypertension: Secondary | ICD-10-CM

## 2015-09-25 DIAGNOSIS — E559 Vitamin D deficiency, unspecified: Secondary | ICD-10-CM

## 2015-09-25 DIAGNOSIS — I340 Nonrheumatic mitral (valve) insufficiency: Secondary | ICD-10-CM

## 2015-09-25 DIAGNOSIS — E871 Hypo-osmolality and hyponatremia: Secondary | ICD-10-CM

## 2015-09-25 DIAGNOSIS — C4490 Unspecified malignant neoplasm of skin, unspecified: Secondary | ICD-10-CM

## 2015-09-25 DIAGNOSIS — E039 Hypothyroidism, unspecified: Secondary | ICD-10-CM | POA: Diagnosis not present

## 2015-09-25 DIAGNOSIS — Z0001 Encounter for general adult medical examination with abnormal findings: Secondary | ICD-10-CM

## 2015-09-25 DIAGNOSIS — R918 Other nonspecific abnormal finding of lung field: Secondary | ICD-10-CM

## 2015-09-25 DIAGNOSIS — E782 Mixed hyperlipidemia: Secondary | ICD-10-CM

## 2015-09-25 DIAGNOSIS — R002 Palpitations: Secondary | ICD-10-CM

## 2015-09-25 DIAGNOSIS — Z79899 Other long term (current) drug therapy: Secondary | ICD-10-CM

## 2015-09-25 DIAGNOSIS — Z0000 Encounter for general adult medical examination without abnormal findings: Secondary | ICD-10-CM | POA: Diagnosis not present

## 2015-09-25 DIAGNOSIS — E876 Hypokalemia: Secondary | ICD-10-CM | POA: Diagnosis not present

## 2015-09-25 DIAGNOSIS — R7303 Prediabetes: Secondary | ICD-10-CM

## 2015-09-25 DIAGNOSIS — J432 Centrilobular emphysema: Secondary | ICD-10-CM

## 2015-09-25 DIAGNOSIS — M19019 Primary osteoarthritis, unspecified shoulder: Secondary | ICD-10-CM

## 2015-09-25 DIAGNOSIS — E46 Unspecified protein-calorie malnutrition: Secondary | ICD-10-CM

## 2015-09-25 LAB — CBC WITH DIFFERENTIAL/PLATELET
Basophils Absolute: 0 10*3/uL (ref 0.0–0.1)
Basophils Relative: 0 % (ref 0–1)
Eosinophils Absolute: 0.2 10*3/uL (ref 0.0–0.7)
Eosinophils Relative: 3 % (ref 0–5)
HEMATOCRIT: 38.5 % (ref 36.0–46.0)
HEMOGLOBIN: 12.6 g/dL (ref 12.0–15.0)
LYMPHS PCT: 22 % (ref 12–46)
Lymphs Abs: 1.5 10*3/uL (ref 0.7–4.0)
MCH: 30.4 pg (ref 26.0–34.0)
MCHC: 32.7 g/dL (ref 30.0–36.0)
MCV: 92.8 fL (ref 78.0–100.0)
MONO ABS: 0.6 10*3/uL (ref 0.1–1.0)
MONOS PCT: 8 % (ref 3–12)
MPV: 11.6 fL (ref 8.6–12.4)
NEUTROS ABS: 4.6 10*3/uL (ref 1.7–7.7)
Neutrophils Relative %: 67 % (ref 43–77)
Platelets: 228 10*3/uL (ref 150–400)
RBC: 4.15 MIL/uL (ref 3.87–5.11)
RDW: 12.7 % (ref 11.5–15.5)
WBC: 6.9 10*3/uL (ref 4.0–10.5)

## 2015-09-25 NOTE — Progress Notes (Signed)
Complete Physical  Assessment and Plan: 1. Essential hypertension - TAKE MAXIDE FOR A FEW DAYS + EDEMA - CONTINUE NORVASC 64m - CBC with Differential - Hepatic function panel - BASIC METABOLIC PANEL WITH GFR - Urinalysis, Routine w reflex microscopic - Microalbumin / creatinine urine ratio  2. Mild mitral regurgitation by prior echocardiogram controlled  3. Pulmonary emphysema, unspecified emphysema type Breathing improved, monitor  4. Hyperlipidemia -continue medications, check lipids, decrease fatty foods, increase activity.  - Lipid panel  5. Prediabetes Discussed general issues about diabetes pathophysiology and management., Educational material distributed., Suggested low cholesterol diet., Encouraged aerobic exercise., Discussed foot care., Reminded to get yearly retinal exam.  6. Hypokalemia - BASIC METABOLIC PANEL WITH GFR  7. Hyponatremia - BASIC METABOLIC PANEL WITH GFR  8. Vitamin D deficiency - Vit D  25 hydroxy (rtn osteoporosis monitoring)  9. Medication management - Magnesium  10. Osteoarthritis of shoulder, unspecified laterality, unspecified osteoarthritis type  11. Lung nodules Continue follow up with Dr. YAnnamaria Boots 12. Hypothyroidism, unspecified hypothyroidism type - TSH  13. Malnutrition Check labs, try ensure/boost  14. Skin cancer Continue follow up Dr. LUbaldo Glassing   Discussed med's effects and SE's. Screening labs and tests as requested with regular follow-up as recommended.  HPI  81y.o. female  presents for a complete physical.   Her blood pressure has been controlled at home, has increase to norvasc 536m runs around 140 at home, today their BP is BP: (!) 170/68 mmHg  She does workout. She denies chest pain, shortness of breath, dizziness.  She is not on cholesterol medication and denies myalgias. Her cholesterol is at goal. The cholesterol last visit was:   Lab Results  Component Value Date   CHOL 219* 06/25/2015   HDL 85 06/25/2015   LDLCALC 121 06/25/2015   TRIG 65 06/25/2015   CHOLHDL 2.6 06/25/2015    She has been working on diet and exercise for prediabetes, and denies paresthesia of the feet, polydipsia, polyuria and visual disturbances. Last A1C in the office was:  Lab Results  Component Value Date   HGBA1C 5.7* 06/25/2015   Patient is on Vitamin D supplement.   Lab Results  Component Value Date   VD25OH 45 06/25/2015     She is on thyroid medication. Her medication was changed last visit, increase dose to 1/5 pill daily except 1 pill on Friday and Sunday.  Lab Results  Component Value Date   TSH 1.781 06/25/2015  .  She has COPD and follows with Dr. YoAnnamaria Bootslast visit 07/04/2014.  She also follows with Dr. CrStanford Breedor aortic insuff.   Current Medications:  Current Outpatient Prescriptions on File Prior to Visit  Medication Sig Dispense Refill  . ALPRAZolam (XANAX) 0.5 MG tablet TAKE ONE-HALF TO ONE TABLET 2 TO 3 TIMESDAILY FOR NERVES OR SLEEP MAYCAUSE DROWSINESS 90 tablet 4  . amLODipine (NORVASC) 5 MG tablet Take 1 tablet (5 mg total) by mouth daily. 90 tablet 3  . Ascorbic Acid (VITAMIN C) 500 MG tablet Take 500 mg by mouth daily.      . Marland Kitchentenolol (TENORMIN) 100 MG tablet TAKE ONE TABLET ONCE DAILY 90 tablet 1  . cholecalciferol (VITAMIN D) 1000 UNITS tablet Take 1,000 Units by mouth daily.     . Marland Kitchenevothyroxine (SYNTHROID, LEVOTHROID) 50 MCG tablet Take 1/2 tablet 5 days a week and 1 tablet on Friday and Sunday or as directed. 90 tablet 1  . losartan (COZAAR) 100 MG tablet TAKE ONE-HALF TABLET IN THE MORNING  AND ONE-HALF TABLET AT NIGHT 90 tablet 3  . omeprazole (PRILOSEC) 40 MG capsule TAKE ONE CAPSULE BY MOUTH DAILY 90 capsule 0  . triamterene-hydrochlorothiazide (MAXZIDE) 75-50 MG per tablet TAKE 1/2 TO ONE TABLET ONCE DAILY FOR BLOOD PRESSURE AND FLUID (Patient taking differently: TAKE 1/2 TO ONE TABLET 2-3 TIMES WEEKLY.) 30 tablet 1   No current facility-administered medications on file prior to  visit.   Health Maintenance:   Immunization History  Administered Date(s) Administered  . Influenza Split 07/16/2013  . Influenza, High Dose Seasonal PF 06/25/2015  . Influenza,inj,Quad PF,36+ Mos 07/04/2014  . Pneumococcal Conjugate-13 02/26/2014  . Td 06/26/2013   Last colonoscopy: 2008 Last mammogram: 07/06/2013, declines another Last pap smear/pelvic exam: remote DEXA: 07/06/2013 + osteoporosis CXR 05/2014 Echo 01/2013- has another scheduled with Dr. Stanford Breed  Prior vaccinations: TD or Tdap: 2014 Influenza: 05/2015 Pneumococcal: 2008 Prevnar 13: 02/2014 Shingles/Zostavax: declines  1. [facility]- here for primary care 2. [place], eye doctor, last visit unknown 3. unknown, dentist, last visit 2 years Patient Care Team: Unk Pinto, MD as PCP - General (Internal Medicine) Deneise Lever, MD as Consulting Physician (Pulmonary Disease) Lelon Perla, MD as Consulting Physician (Cardiology) Inda Castle, MD as Consulting Physician (Gastroenterology) Rolm Bookbinder, MD as Consulting Physician (Dermatology)  Allergies:  Allergies  Allergen Reactions  . Sulfa Antibiotics Other (See Comments)    Unknown   Medical History:  Past Medical History  Diagnosis Date  . Palpitations   . HLD (hyperlipidemia)   . DJD (degenerative joint disease)   . Mild mitral regurgitation by prior echocardiogram   . Mild tricuspid regurgitation   . HTN (hypertension)    Surgical History:  Past Surgical History  Procedure Laterality Date  . Abdominal hysterectomy  1993  . Cataract extraction, bilateral     Family History:  Family History  Problem Relation Age of Onset  . Cancer Father   . Emphysema Sister   . Emphysema Sister   . Emphysema Mother   . Rheumatic fever Sister   . Heart attack Sister   . Diabetes Mother   . Diabetes Sister   . Diabetes Brother   . Asthma Brother     as a child   Social History:  Social  History  Substance Use Topics  . Smoking status: Former Smoker -- 1.00 packs/day for 20 years    Types: Cigarettes    Quit date: 09/27/1974  . Smokeless tobacco: Never Used  . Alcohol Use: No   Review of Systems  Constitutional: Negative for fever, chills and malaise/fatigue.  HENT: Positive for hearing loss (going to see ENT). Negative for congestion, ear discharge and sore throat.   Respiratory: Negative for cough, shortness of breath and wheezing.   Cardiovascular: Negative for chest pain, palpitations and leg swelling.  Gastrointestinal: Negative for diarrhea, constipation, blood in stool and melena.  Genitourinary: Negative.   Skin: Negative.   Neurological: Negative for dizziness, sensory change, loss of consciousness and headaches.  Psychiatric/Behavioral: Negative for depression. The patient is not nervous/anxious and does not have insomnia.      Physical Exam: Estimated body mass index is 18.07 kg/(m^2) as calculated from the following:   Height as of this encounter: _0  (1.6 m).   Weight as of this encounter: 102 lb (46.267 kg). BP 170/68 mmHg  Pulse 65  Temp(Src) 98.1 F (36.7 C) (Temporal)  Resp 16  Ht _1  (1.6 m)  Wt 102 lb (46.267 kg)  BMI 18.07 kg/m2  SpO2 95% General Appearance: Malnourished/underweight in no apparent distress.  Eyes: PERRLA, EOMs, conjunctiva no swelling or erythema, normal fundi and vessels.  Sinuses: No Frontal/maxillary tenderness  ENT/Mouth: Ext aud canals clear on the right but with cerumen impaction on the left, normal light reflex with TMs without erythema, bulging. Good dentition. No erythema, swelling, or exudate on post pharynx. Tonsils not swollen or erythematous. Hearing decreased Neck: Supple, thyroid normal. No bruits  Respiratory: Respiratory effort normal, decreased breathsounds, BS equal bilaterally without rales, rhonchi, wheezing or stridor.  Cardio: RRR with holosystolic 2/6, prominent S2. Brisk peripheral pulses with  2+ edema.  Chest: symmetric, with normal excursions and percussion.  Breasts: no palpable masses, no nipple discharge/retraction.   Abdomen: Soft, + epigastric tenderness, no guarding, rebound, hernias, masses, or organomegaly. .  Lymphatics: Non tender without lymphadenopathy.  Genitourinary:  Musculoskeletal: Full ROM all peripheral extremities,4/5 strength, and normal gait.  Skin: Warm, dry without rashes, lesions, ecchymosis. Neuro: Cranial nerves intact, reflexes equal bilaterally. Normal muscle tone, no cerebellar symptoms. [name]  Psych: Awake and oriented X 3, normal affect, Insight and Judgment appropriate.   EKG: had at Dr. Lonia Skinner  AORTA SCAN: declines   Vicie Mutters 3:30 PM [facility]

## 2015-09-25 NOTE — Patient Instructions (Signed)
Preventive Care for Adults A healthy lifestyle and preventive care can promote health and wellness. Preventive health guidelines for women include the following key practices.  A routine yearly physical is a good way to check with your health care provider about your health and preventive screening. It is a chance to share any concerns and updates on your health and to receive a thorough exam.  Visit your dentist for a routine exam and preventive care every 6 months. Brush your teeth twice a day and floss once a day. Good oral hygiene prevents tooth decay and gum disease.  The frequency of eye exams is based on your age, health, family medical history, use of contact lenses, and other factors. Follow your health care provider's recommendations for frequency of eye exams.  Eat a healthy diet. Foods like vegetables, fruits, whole grains, low-fat dairy products, and lean protein foods contain the nutrients you need without too many calories. Decrease your intake of foods high in solid fats, added sugars, and salt. Eat the right amount of calories for you.Get information about a proper diet from your health care provider, if necessary.  Regular physical exercise is one of the most important things you can do for your health. Most adults should get at least 150 minutes of moderate-intensity exercise (any activity that increases your heart rate and causes you to sweat) each week. In addition, most adults need muscle-strengthening exercises on 2 or more days a week.  Maintain a healthy weight. The body mass index (BMI) is a screening tool to identify possible weight problems. It provides an estimate of body fat based on height and weight. Your health care provider can find your BMI and can help you achieve or maintain a healthy weight.For adults 20 years and older:  A BMI below 18.5 is considered underweight.  A BMI of 18.5 to 24.9 is normal.  A BMI of 25 to 29.9 is considered overweight.  A BMI of  30 and above is considered obese.  Maintain normal blood lipids and cholesterol levels by exercising and minimizing your intake of saturated fat. Eat a balanced diet with plenty of fruit and vegetables. If your lipid or cholesterol levels are high, you are over 50, or you are at high risk for heart disease, you may need your cholesterol levels checked more frequently.Ongoing high lipid and cholesterol levels should be treated with medicines if diet and exercise are not working.  If you smoke, find out from your health care provider how to quit. If you do not use tobacco, do not start.  Lung cancer screening is recommended for adults aged 86-80 years who are at high risk for developing lung cancer because of a history of smoking. A yearly low-dose CT scan of the lungs is recommended for people who have at least a 30-pack-year history of smoking and are a current smoker or have quit within the past 15 years. A pack year of smoking is smoking an average of 1 pack of cigarettes a day for 1 year (for example: 1 pack a day for 30 years or 2 packs a day for 15 years). Yearly screening should continue until the smoker has stopped smoking for at least 15 years. Yearly screening should be stopped for people who develop a health problem that would prevent them from having lung cancer treatment.  Avoid use of street drugs. Do not share needles with anyone. Ask for help if you need support or instructions about stopping the use of drugs.  High blood  pressure causes heart disease and increases the risk of stroke.  Ongoing high blood pressure should be treated with medicines if weight loss and exercise do not work.  If you are 55-79 years old, ask your health care provider if you should take aspirin to prevent strokes.  Diabetes screening involves taking a blood sample to check your fasting blood sugar level. This should be done once every 3 years, after age 45, if you are within normal weight and without risk  factors for diabetes. Testing should be considered at a younger age or be carried out more frequently if you are overweight and have at least 1 risk factor for diabetes.  Breast cancer screening is essential preventive care for women. You should practice "breast self-awareness." This means understanding the normal appearance and feel of your breasts and may include breast self-examination. Any changes detected, no matter how small, should be reported to a health care provider. Women in their 20s and 30s should have a clinical breast exam (CBE) by a health care provider as part of a regular health exam every 1 to 3 years. After age 40, women should have a CBE every year. Starting at age 40, women should consider having a mammogram (breast X-ray test) every year. Women who have a family history of breast cancer should talk to their health care provider about genetic screening. Women at a high risk of breast cancer should talk to their health care providers about having an MRI and a mammogram every year.  Breast cancer gene (BRCA)-related cancer risk assessment is recommended for women who have family members with BRCA-related cancers. BRCA-related cancers include breast, ovarian, tubal, and peritoneal cancers. Having family members with these cancers may be associated with an increased risk for harmful changes (mutations) in the breast cancer genes BRCA1 and BRCA2. Results of the assessment will determine the need for genetic counseling and BRCA1 and BRCA2 testing.  Routine pelvic exams to screen for cancer are no longer recommended for nonpregnant women who are considered low risk for cancer of the pelvic organs (ovaries, uterus, and vagina) and who do not have symptoms. Ask your health care provider if a screening pelvic exam is right for you.  If you have had past treatment for cervical cancer or a condition that could lead to cancer, you need Pap tests and screening for cancer for at least 20 years after  your treatment. If Pap tests have been discontinued, your risk factors (such as having a new sexual partner) need to be reassessed to determine if screening should be resumed. Some women have medical problems that increase the chance of getting cervical cancer. In these cases, your health care provider may recommend more frequent screening and Pap tests.    Colorectal cancer can be detected and often prevented. Most routine colorectal cancer screening begins at the age of 50 years and continues through age 75 years. However, your health care provider may recommend screening at an earlier age if you have risk factors for colon cancer. On a yearly basis, your health care provider may provide home test kits to check for hidden blood in the stool. Use of a small camera at the end of a tube, to directly examine the colon (sigmoidoscopy or colonoscopy), can detect the earliest forms of colorectal cancer. Talk to your health care provider about this at age 50, when routine screening begins. Direct exam of the colon should be repeated every 5-10 years through age 75 years, unless early forms of pre-cancerous polyps   or small growths are found.  Osteoporosis is a disease in which the bones lose minerals and strength with aging. This can result in serious bone fractures or breaks. The risk of osteoporosis can be identified using a bone density scan. Women ages 62 years and over and women at risk for fractures or osteoporosis should discuss screening with their health care providers. Ask your health care provider whether you should take a calcium supplement or vitamin D to reduce the rate of osteoporosis.  Menopause can be associated with physical symptoms and risks. Hormone replacement therapy is available to decrease symptoms and risks. You should talk to your health care provider about whether hormone replacement therapy is right for you.  Use sunscreen. Apply sunscreen liberally and repeatedly throughout the day.  You should seek shade when your shadow is shorter than you. Protect yourself by wearing long sleeves, pants, a wide-brimmed hat, and sunglasses year round, whenever you are outdoors.  Once a month, do a whole body skin exam, using a mirror to look at the skin on your back. Tell your health care provider of new moles, moles that have irregular borders, moles that are larger than a pencil eraser, or moles that have changed in shape or color.  Stay current with required vaccines (immunizations).  Influenza vaccine. All adults should be immunized every year.  Tetanus, diphtheria, and acellular pertussis (Td, Tdap) vaccine. Pregnant women should receive 1 dose of Tdap vaccine during each pregnancy. The dose should be obtained regardless of the length of time since the last dose. Immunization is preferred during the 27th-36th week of gestation. An adult who has not previously received Tdap or who does not know her vaccine status should receive 1 dose of Tdap. This initial dose should be followed by tetanus and diphtheria toxoids (Td) booster doses every 10 years. Adults with an unknown or incomplete history of completing a 3-dose immunization series with Td-containing vaccines should begin or complete a primary immunization series including a Tdap dose. Adults should receive a Td booster every 10 years.    Zoster vaccine. One dose is recommended for adults aged 4 years or older unless certain conditions are present.    Pneumococcal 13-valent conjugate (PCV13) vaccine. When indicated, a person who is uncertain of her immunization history and has no record of immunization should receive the PCV13 vaccine. An adult aged 35 years or older who has certain medical conditions and has not been previously immunized should receive 1 dose of PCV13 vaccine. This PCV13 should be followed with a dose of pneumococcal polysaccharide (PPSV23) vaccine. The PPSV23 vaccine dose should be obtained at least 8 weeks after the  dose of PCV13 vaccine. An adult aged 85 years or older who has certain medical conditions and previously received 1 or more doses of PPSV23 vaccine should receive 1 dose of PCV13. The PCV13 vaccine dose should be obtained 1 or more years after the last PPSV23 vaccine dose.    Pneumococcal polysaccharide (PPSV23) vaccine. When PCV13 is also indicated, PCV13 should be obtained first. All adults aged 15 years and older should be immunized. An adult younger than age 38 years who has certain medical conditions should be immunized. Any person who resides in a nursing home or long-term care facility should be immunized. An adult smoker should be immunized. People with an immunocompromised condition and certain other conditions should receive both PCV13 and PPSV23 vaccines. People with human immunodeficiency virus (HIV) infection should be immunized as soon as possible after diagnosis. Immunization during  chemotherapy or radiation therapy should be avoided. Routine use of PPSV23 vaccine is not recommended for American Indians, [place], or people younger than 65 years unless there are medical conditions that require PPSV23 vaccine. When indicated, people who have unknown immunization and have no record of immunization should receive PPSV23 vaccine. One-time revaccination 5 years after the first dose of PPSV23 is recommended for people aged 19-64 years who have chronic kidney failure, nephrotic syndrome, asplenia, or immunocompromised conditions. People who received 1-2 doses of PPSV23 before age 68 years should receive another dose of PPSV23 vaccine at age 34 years or later if at least 5 years have passed since the previous dose. Doses of PPSV23 are not needed for people immunized with PPSV23 at or after age 17 years.   Preventive Services / Frequency  Ages 75 years and over  Blood pressure check.  Lipid and cholesterol check.  Lung cancer screening. / Every year if you are aged 7-80 years and have a  30-pack-year history of smoking and currently smoke or have quit within the past 15 years. Yearly screening is stopped once you have quit smoking for at least 15 years or develop a health problem that would prevent you from having lung cancer treatment.  Clinical breast exam.** / Every year after age 58 years.  BRCA-related cancer risk assessment.** / For women who have family members with a BRCA-related cancer (breast, ovarian, tubal, or peritoneal cancers).  Mammogram.** / Every year beginning at age 71 years and continuing for as long as you are in good health. Consult with your health care provider.  Pap test.** / Every 3 years starting at age 30 years through age 54 or 60 years with 3 consecutive normal Pap tests. Testing can be stopped between 65 and 70 years with 3 consecutive normal Pap tests and no abnormal Pap or HPV tests in the past 10 years.  Fecal occult blood test (FOBT) of stool. / Every year beginning at age 75 years and continuing until age 13 years. You may not need to do this test if you get a colonoscopy every 10 years.  Flexible sigmoidoscopy or colonoscopy.** / Every 5 years for a flexible sigmoidoscopy or every 10 years for a colonoscopy beginning at age 51 years and continuing until age 34 years.  Hepatitis C blood test.** / For all people born from [birth year] through 1963 and any individual with known risks for hepatitis C.  Osteoporosis screening.** / A one-time screening for women ages 68 years and over and women at risk for fractures or osteoporosis.  Skin self-exam. / Monthly.  Influenza vaccine. / Every year.  Tetanus, diphtheria, and acellular pertussis (Tdap/Td) vaccine.** / 1 dose of Td every 10 years.  Zoster vaccine.** / 1 dose for adults aged 70 years or older.  Pneumococcal 13-valent conjugate (PCV13) vaccine.** / Consult your health care provider.  Pneumococcal polysaccharide (PPSV23) vaccine.** / 1 dose for all adults aged 58 years and older. Screening  for abdominal aortic aneurysm (AAA)  by ultrasound is recommended for people who have history of high blood pressure or who are current or former smokers.

## 2015-09-26 LAB — URINALYSIS, ROUTINE W REFLEX MICROSCOPIC
BILIRUBIN URINE: NEGATIVE
GLUCOSE, UA: NEGATIVE
Hgb urine dipstick: NEGATIVE
Ketones, ur: NEGATIVE
Nitrite: NEGATIVE
PH: 7 (ref 5.0–8.0)
Protein, ur: NEGATIVE
SPECIFIC GRAVITY, URINE: 1.008 (ref 1.001–1.035)

## 2015-09-26 LAB — LIPID PANEL
CHOL/HDL RATIO: 2 ratio (ref <=5.0)
CHOLESTEROL: 222 mg/dL — AB (ref 125–200)
HDL: 111 mg/dL (ref >=46)
LDL Cholesterol: 96 mg/dL (ref <130)
Triglycerides: 75 mg/dL (ref <150)
VLDL: 15 mg/dL (ref <30)

## 2015-09-26 LAB — BASIC METABOLIC PANEL WITH GFR
BUN: 17 mg/dL (ref 7–25)
CALCIUM: 9.7 mg/dL (ref 8.6–10.4)
CHLORIDE: 94 mmol/L — AB (ref 98–110)
CO2: 31 mmol/L (ref 20–31)
CREATININE: 0.83 mg/dL (ref 0.60–0.88)
GFR, EST NON AFRICAN AMERICAN: 65 mL/min (ref >=60)
GFR, Est African American: 75 mL/min (ref >=60)
GLUCOSE: 87 mg/dL (ref 65–99)
Potassium: 4.1 mmol/L (ref 3.5–5.3)
Sodium: 136 mmol/L (ref 135–146)

## 2015-09-26 LAB — HEPATIC FUNCTION PANEL
ALT: 9 U/L (ref 6–29)
AST: 22 U/L (ref 10–35)
Albumin: 4.4 g/dL (ref 3.6–5.1)
Alkaline Phosphatase: 73 U/L (ref 33–130)
BILIRUBIN INDIRECT: 0.6 mg/dL (ref 0.2–1.2)
Bilirubin, Direct: 0.1 mg/dL (ref <=0.2)
Total Bilirubin: 0.7 mg/dL (ref 0.2–1.2)
Total Protein: 7.3 g/dL (ref 6.1–8.1)

## 2015-09-26 LAB — TSH: TSH: 1.714 u[IU]/mL (ref 0.350–4.500)

## 2015-09-26 LAB — MICROALBUMIN / CREATININE URINE RATIO
Creatinine, Urine: 33 mg/dL (ref 20–320)
MICROALB/CREAT RATIO: 203 ug/mg{creat} — AB (ref <30)
Microalb, Ur: 6.7 mg/dL

## 2015-09-26 LAB — URINALYSIS, MICROSCOPIC ONLY
BACTERIA UA: NONE SEEN [HPF]
CRYSTALS: NONE SEEN [HPF]
Casts: NONE SEEN [LPF]
RBC / HPF: NONE SEEN RBC/HPF (ref <=2)
Squamous Epithelial / LPF: NONE SEEN [HPF] (ref <=5)
YEAST: NONE SEEN [HPF]

## 2015-09-26 LAB — VITAMIN D 25 HYDROXY (VIT D DEFICIENCY, FRACTURES): Vit D, 25-Hydroxy: 42 ng/mL (ref 30–100)

## 2015-09-26 LAB — MAGNESIUM: Magnesium: 1.7 mg/dL (ref 1.5–2.5)

## 2015-10-02 ENCOUNTER — Ambulatory Visit (INDEPENDENT_AMBULATORY_CARE_PROVIDER_SITE_OTHER): Payer: Medicare Other

## 2015-10-02 ENCOUNTER — Other Ambulatory Visit: Payer: Self-pay

## 2015-10-02 ENCOUNTER — Ambulatory Visit (HOSPITAL_COMMUNITY): Payer: Medicare Other | Attending: Cardiovascular Disease

## 2015-10-02 DIAGNOSIS — I10 Essential (primary) hypertension: Secondary | ICD-10-CM | POA: Diagnosis not present

## 2015-10-02 DIAGNOSIS — I351 Nonrheumatic aortic (valve) insufficiency: Secondary | ICD-10-CM | POA: Diagnosis not present

## 2015-10-02 DIAGNOSIS — R002 Palpitations: Secondary | ICD-10-CM | POA: Diagnosis not present

## 2015-10-02 DIAGNOSIS — Z87891 Personal history of nicotine dependence: Secondary | ICD-10-CM | POA: Diagnosis not present

## 2015-10-02 DIAGNOSIS — E559 Vitamin D deficiency, unspecified: Secondary | ICD-10-CM | POA: Insufficient documentation

## 2015-10-02 DIAGNOSIS — R7303 Prediabetes: Secondary | ICD-10-CM | POA: Diagnosis not present

## 2015-10-02 DIAGNOSIS — E039 Hypothyroidism, unspecified: Secondary | ICD-10-CM | POA: Diagnosis not present

## 2015-10-02 DIAGNOSIS — E785 Hyperlipidemia, unspecified: Secondary | ICD-10-CM | POA: Diagnosis not present

## 2015-10-06 DIAGNOSIS — J449 Chronic obstructive pulmonary disease, unspecified: Secondary | ICD-10-CM | POA: Diagnosis not present

## 2015-10-13 ENCOUNTER — Other Ambulatory Visit: Payer: Self-pay | Admitting: Internal Medicine

## 2015-10-13 DIAGNOSIS — F411 Generalized anxiety disorder: Secondary | ICD-10-CM

## 2015-11-06 DIAGNOSIS — J449 Chronic obstructive pulmonary disease, unspecified: Secondary | ICD-10-CM | POA: Diagnosis not present

## 2015-11-07 ENCOUNTER — Other Ambulatory Visit: Payer: Self-pay | Admitting: Internal Medicine

## 2015-12-02 NOTE — Progress Notes (Signed)
HPI: FU palpitations and hypertension. Nuclear study in October 2009 revealed an ejection fraction of 73% and normal perfusion. CardioNet in February of 2014 showed sinus rhythm with PACs, PVCs, occasional junctional escape beats and pauses greater than 3 seconds. Cardizem was discontinued and Norvasc added. Last echocardiogram January 2017 showed normal LV systolic function, grade 2 diastolic dysfunction, mild to moderate aortic insufficiency, moderately elevated pulmonary pressures, possible small mass in the ascending aorta (question thrombus versus artifact). Monitor in Jan 2017 showed sinus rhythm with PACs and brief PAT. Since last seen, She denies dyspnea, chest pain or syncope. Occasional brief palpitations.  Current Outpatient Prescriptions  Medication Sig Dispense Refill  . ALPRAZolam (XANAX) 0.5 MG tablet Take 1/2 to 1 tablet 3 x day if needed for nerves 270 tablet 1  . amLODipine (NORVASC) 5 MG tablet Take 1 tablet (5 mg total) by mouth daily. 90 tablet 3  . Ascorbic Acid (VITAMIN C) 500 MG tablet Take 500 mg by mouth daily.      [name] atenolol (TENORMIN) 100 MG tablet TAKE ONE TABLET ONCE DAILY 90 tablet 1  . cholecalciferol (VITAMIN D) 1000 UNITS tablet Take 1,000 Units by mouth daily.     [name] levothyroxine (SYNTHROID, LEVOTHROID) 50 MCG tablet Take 1 tablet daily or as directed 90 tablet 1  . losartan (COZAAR) 100 MG tablet TAKE ONE-HALF TABLET IN THE MORNING AND ONE-HALF TABLET AT NIGHT 90 tablet 3  . mupirocin ointment (BACTROBAN) 2 %     . omeprazole (PRILOSEC) 40 MG capsule TAKE ONE CAPSULE BY MOUTH DAILY 90 capsule 0  . triamterene-hydrochlorothiazide (MAXZIDE) 75-50 MG per tablet TAKE 1/2 TO ONE TABLET ONCE DAILY FOR BLOOD PRESSURE AND FLUID (Patient taking differently: TAKE 1/2 TO ONE TABLET 2-3 TIMES WEEKLY.) 30 tablet 1   No current facility-administered medications for this visit.     Past Medical History  Diagnosis Date  . Palpitations   . HLD (hyperlipidemia)   .  DJD (degenerative joint disease)   . Mild mitral regurgitation by prior echocardiogram   . Mild tricuspid regurgitation   . HTN (hypertension)     Past Surgical History  Procedure Laterality Date  . Abdominal hysterectomy  1993  . Cataract extraction, bilateral      Social History   Social History  . Marital Status: Married    Spouse Name: N/A  . Number of Children: 5  . Years of Education: N/A   Occupational History  . retired    Social History Main Topics  . Smoking status: Former Smoker -- 1.00 packs/day for 20 years    Types: Cigarettes    Quit date: 09/27/1974  . Smokeless tobacco: Never Used  . Alcohol Use: No  . Drug Use: No  . Sexual Activity: No   Other Topics Concern  . Not on file   Social History Narrative    Family History  Problem Relation Age of Onset  . Cancer Father   . Emphysema Sister   . Emphysema Sister   . Emphysema Mother   . Rheumatic fever Sister   . Heart attack Sister   . Diabetes Mother   . Diabetes Sister   . Diabetes Brother   . Asthma Brother     as a child    ROS: no fevers or chills, productive cough, hemoptysis, dysphasia, odynophagia, melena, hematochezia, dysuria, hematuria, rash, seizure activity, orthopnea, PND, pedal edema, claudication. Remaining systems are negative.  Physical Exam: Well-developed frrail in no acute distress.  Skin is warm and dry.  HEENT is normal.  Neck is supple.  Chest is clear to auscultation with normal expansion.  Cardiovascular exam is regular rate and rhythm.  Abdominal exam nontender or distended. No masses palpated. Extremities show no edema. neuro grossly intact

## 2015-12-04 DIAGNOSIS — J449 Chronic obstructive pulmonary disease, unspecified: Secondary | ICD-10-CM | POA: Diagnosis not present

## 2015-12-09 ENCOUNTER — Telehealth: Payer: Self-pay | Admitting: Internal Medicine

## 2015-12-09 ENCOUNTER — Encounter: Payer: Self-pay | Admitting: Cardiology

## 2015-12-09 ENCOUNTER — Ambulatory Visit (INDEPENDENT_AMBULATORY_CARE_PROVIDER_SITE_OTHER): Payer: Medicare Other | Admitting: Cardiology

## 2015-12-09 DIAGNOSIS — I351 Nonrheumatic aortic (valve) insufficiency: Secondary | ICD-10-CM

## 2015-12-09 DIAGNOSIS — I10 Essential (primary) hypertension: Secondary | ICD-10-CM

## 2015-12-09 MED ORDER — AMLODIPINE BESYLATE 10 MG PO TABS: 10.0000 mg | ORAL_TABLET | Freq: Every day | ORAL | Status: DC

## 2015-12-09 NOTE — Assessment & Plan Note (Signed)
Mild to moderate almost recent echo.

## 2015-12-09 NOTE — Assessment & Plan Note (Signed)
Blood pressure elevated. Increase amlodipine to 10 mg daily.

## 2015-12-09 NOTE — Assessment & Plan Note (Signed)
Continue beta blocker.

## 2015-12-09 NOTE — Patient Instructions (Signed)
Medication Instructions:   INCREASE AMLODIPINE TO 10 MG ONCE DAILY= 2 OF THE 5 MG TABLETS ONCE DAILY  Follow-Up:  Your physician wants you to follow-up in: [place] will receive a reminder letter in the mail two months in advance. If you don't receive a letter, please call our office to schedule the follow-up appointment.

## 2015-12-09 NOTE — Telephone Encounter (Signed)
Per 05/06/15 O V: Patient Instructions       We can continue O2 2L for sleep/ [facility]    Please call if we can help  --  Called spoke with pt. She is wanting to take a trip to [place] x 3 days. She is not wanting to take her O2 to use at bedtime since it is only for 3 days. She wants to know what Dr. Annamaria [name] thinks. Please advise thanks

## 2015-12-10 NOTE — Telephone Encounter (Signed)
Spoke with pt. She is aware that she should contact [facility]. Nothing further was needed.

## 2015-12-10 NOTE — Telephone Encounter (Signed)
Ssuggest she talk with [facility] about taking a portable O2 concentrator for her trip. Better safe than sorry.

## 2015-12-11 ENCOUNTER — Other Ambulatory Visit: Payer: Self-pay | Admitting: Internal Medicine

## 2015-12-18 ENCOUNTER — Telehealth: Payer: Self-pay | Admitting: Internal Medicine

## 2015-12-18 DIAGNOSIS — J432 Centrilobular emphysema: Secondary | ICD-10-CM

## 2015-12-18 NOTE — Telephone Encounter (Signed)
Last ov 05/06/15 Patient Instructions     We can continue O2 2L for sleep/ [facility]    Please call if we can help   Called and spoke with pt's daughter. She states her and her mother are leaving for [place] on 01/03/16-01/06/16. She states that the home health store in [place] will provide oxygen if we fax a rx to [phone]. Pt will only need oxygen will sleeping. I explained to the daughter that I will need to send a message to [facility] to get approval but once received I would return her call. She voiced understanding and had no further questions.  [facility] please advise

## 2015-12-18 NOTE — Telephone Encounter (Signed)
Ok to order as requested

## 2015-12-18 NOTE — Telephone Encounter (Signed)
Order has been placed and pt is aware. Nothing further needed

## 2015-12-30 ENCOUNTER — Ambulatory Visit (INDEPENDENT_AMBULATORY_CARE_PROVIDER_SITE_OTHER): Payer: Medicare Other | Admitting: Internal Medicine

## 2015-12-30 ENCOUNTER — Encounter: Payer: Self-pay | Admitting: Internal Medicine

## 2015-12-30 VITALS — BP 120/66 | HR 72 | Temp 97.7°F | Resp 16 | Ht 63.0 in | Wt 103.0 lb

## 2015-12-30 DIAGNOSIS — R7303 Prediabetes: Secondary | ICD-10-CM | POA: Diagnosis not present

## 2015-12-30 DIAGNOSIS — I10 Essential (primary) hypertension: Secondary | ICD-10-CM

## 2015-12-30 DIAGNOSIS — Z79899 Other long term (current) drug therapy: Secondary | ICD-10-CM | POA: Diagnosis not present

## 2015-12-30 DIAGNOSIS — E782 Mixed hyperlipidemia: Secondary | ICD-10-CM

## 2015-12-30 DIAGNOSIS — E559 Vitamin D deficiency, unspecified: Secondary | ICD-10-CM

## 2015-12-30 DIAGNOSIS — R7309 Other abnormal glucose: Secondary | ICD-10-CM | POA: Diagnosis not present

## 2015-12-30 LAB — CBC WITH DIFFERENTIAL/PLATELET
Basophils Absolute: 0 cells/uL (ref 0–200)
Basophils Relative: 0 %
EOS ABS: 189 {cells}/uL (ref 15–500)
Eosinophils Relative: 3 %
HEMATOCRIT: 37.7 % (ref 35.0–45.0)
HEMOGLOBIN: 12 g/dL (ref 11.7–15.5)
LYMPHS ABS: 1386 {cells}/uL (ref 850–3900)
Lymphocytes Relative: 22 %
MCH: 29.5 pg (ref 27.0–33.0)
MCHC: 31.8 g/dL — AB (ref 32.0–36.0)
MCV: 92.6 fL (ref 80.0–100.0)
MONO ABS: 567 {cells}/uL (ref 200–950)
MPV: 11.4 fL (ref 7.5–12.5)
Monocytes Relative: 9 %
NEUTROS PCT: 66 %
Neutro Abs: 4158 cells/uL (ref 1500–7800)
Platelets: 223 10*3/uL (ref 140–400)
RBC: 4.07 MIL/uL (ref 3.80–5.10)
RDW: 12.8 % (ref 11.0–15.0)
WBC: 6.3 10*3/uL (ref 3.8–10.8)

## 2015-12-30 LAB — BASIC METABOLIC PANEL WITH GFR
BUN: 25 mg/dL (ref 7–25)
CALCIUM: 9.1 mg/dL (ref 8.6–10.4)
CO2: 33 mmol/L — AB (ref 20–31)
CREATININE: 0.81 mg/dL (ref 0.60–0.88)
Chloride: 98 mmol/L (ref 98–110)
GFR, EST AFRICAN AMERICAN: 77 mL/min (ref >=60)
GFR, Est Non African American: 67 mL/min (ref >=60)
GLUCOSE: 92 mg/dL (ref 65–99)
Potassium: 4.1 mmol/L (ref 3.5–5.3)
Sodium: 142 mmol/L (ref 135–146)

## 2015-12-30 LAB — HEPATIC FUNCTION PANEL
ALBUMIN: 3.9 g/dL (ref 3.6–5.1)
ALT: 7 U/L (ref 6–29)
AST: 19 U/L (ref 10–35)
Alkaline Phosphatase: 74 U/L (ref 33–130)
BILIRUBIN INDIRECT: 0.4 mg/dL (ref 0.2–1.2)
Bilirubin, Direct: 0.1 mg/dL (ref <=0.2)
TOTAL PROTEIN: 6.6 g/dL (ref 6.1–8.1)
Total Bilirubin: 0.5 mg/dL (ref 0.2–1.2)

## 2015-12-30 LAB — HEMOGLOBIN A1C
Hgb A1c MFr Bld: 5.9 % — ABNORMAL HIGH (ref <5.7)
Mean Plasma Glucose: 123 mg/dL

## 2015-12-30 LAB — MAGNESIUM: MAGNESIUM: 1.7 mg/dL (ref 1.5–2.5)

## 2015-12-30 LAB — LIPID PANEL
CHOLESTEROL: 199 mg/dL (ref 125–200)
HDL: 85 mg/dL (ref >=46)
LDL Cholesterol: 104 mg/dL (ref <130)
TRIGLYCERIDES: 51 mg/dL (ref <150)
Total CHOL/HDL Ratio: 2.3 Ratio (ref <=5.0)
VLDL: 10 mg/dL (ref <30)

## 2015-12-30 LAB — TSH: TSH: 2.57 mIU/L

## 2015-12-30 NOTE — Progress Notes (Signed)
Patient ID: Andrea [name], female   DOB: 1928/11/09, 82 y.o.   MRN: [id number]   This very nice 82 y.o. MWF presents for  follow up with Hypertension, Hyperlipidemia, Pre-Diabetes, Hypothyroidism  and Vitamin D Deficiency.    Patient is treated for HTN since circa 1990's & BP has been controlled at home. Today's BP: 120/66 mmHg. Patient has had no complaints of any cardiac type chest pain, palpitations, dyspnea/orthopnea/PND, dizziness, claudication, or dependent edema.   Hyperlipidemia is controlled with diet (as patient is known Statin Intolerant). Patient denies myalgias or other med SE's. Last Lipids were Cholesterol 222*; HDL 111; LDL 96; Triglycerides 75 on 09/25/2015.   Also, the patient has history of PreDiabetes since 05/2011 with A1c 5.9% and she has had no symptoms of reactive hypoglycemia, diabetic polys, paresthesias or visual blurring.  Last A1c was  5.7% on 06/25/2015.   Patient has been on Thyroid replacement since 2014. Further, the patient also has history of Vitamin D Deficiency of "39" in in 2009 and supplements vitamin D without any suspected side-effects. Last vitamin D was  42 on 09/25/2015.  Medication Sig  . ALPRAZolam (XANAX) 0.5 MG tablet Take 1/2 to 1 tablet 3 x day if needed for nerves  . amLODipine (NORVASC) 10 MG tablet Take 1 tablet (10 mg total) by mouth daily.  [name] VITAMIN C 500 MG tablet Take 500 mg by mouth daily.    [name] atenolol (TENORMIN) 100 MG tablet TAKE ONE TABLET ONCE DAILY  . VITAMIN D 1000 UNITS tablet Take 1,000 Units by mouth daily.   [name] levothyroxine  50 MCG tablet Take 1 tablet daily or as directed  . losartan (COZAAR) 100 MG tablet TAKE ONE-HALF TABLET IN THE MORNING AND ONE-HALF TABLET AT NIGHT  . mupirocin ointment (BACTROBAN) 2 %   . omeprazole40 MG capsule TAKE ONE (1) CAPSULE EACH DAY  . triamterene-hctz (MAXZIDE) 75-50 MG Patient taking differently: TAKE 1/2 TO ONE TABLET 2-3 TIMES WEEKLY   Allergies  Allergen Reactions  . Sulfa Antibiotics  Other (See Comments)    Unknown   PMHx:   Past Medical History  Diagnosis Date  . Palpitations   . HLD (hyperlipidemia)   . DJD (degenerative joint disease)   . Mild mitral regurgitation by prior echocardiogram   . Mild tricuspid regurgitation   . HTN (hypertension)    Immunization History  Administered Date(s) Administered  . Influenza Split 07/16/2013  . Influenza, High Dose Seasonal PF 06/25/2015  . Influenza,inj,Quad PF,36+ Mos 07/04/2014  . Pneumococcal Conjugate-13 02/26/2014  . Td 06/26/2013   Past Surgical History  Procedure Laterality Date  . Abdominal hysterectomy  1993  . Cataract extraction, bilateral     FHx:    Reviewed / unchanged  SHx:    Reviewed / unchanged  Systems Review:  Constitutional: Denies fever, chills, wt changes, headaches, insomnia, fatigue, night sweats, change in appetite. Eyes: Denies redness, blurred vision, diplopia, discharge, itchy, watery eyes.  ENT: Denies discharge, congestion, post nasal drip, epistaxis, sore throat, earache, hearing loss, dental pain, tinnitus, vertigo, sinus pain, snoring.  CV: Denies chest pain, palpitations, irregular heartbeat, syncope, dyspnea, diaphoresis, orthopnea, PND, claudication or edema. Respiratory: denies cough, dyspnea, DOE, pleurisy, hoarseness, laryngitis, wheezing.  Gastrointestinal: Denies dysphagia, odynophagia, heartburn, reflux, water brash, abdominal pain or cramps, nausea, vomiting, bloating, diarrhea, constipation, hematemesis, melena, hematochezia  or hemorrhoids. Genitourinary: Denies dysuria, frequency, urgency, nocturia, hesitancy, discharge, hematuria or flank pain. Musculoskeletal: Denies arthralgias, myalgias, stiffness, jt. swelling, pain, limping or strain/sprain.  Skin:  Denies pruritus, rash, hives, warts, acne, eczema or change in skin lesion(s). Neuro: No weakness, tremor, incoordination, spasms, paresthesia or pain. Psychiatric: Denies confusion, memory loss or sensory  loss. Endo: Denies change in weight, skin or hair change.  Heme/Lymph: No excessive bleeding, bruising or enlarged lymph nodes.  Physical Exam  BP 120/66 mmHg  Pulse 72  Temp(Src) 97.7 F (36.5 C)  Resp 16  Ht _0  (1.6 m)  Wt 103 lb (46.72 kg)  BMI 18.25 kg/m2  Appears well nourished and in no distress.  Eyes: PERRLA, EOMs, conjunctiva no swelling or erythema. Sinuses: No frontal/maxillary tenderness ENT/Mouth: EAC's clear, TM's nl w/o erythema, bulging. Nares clear w/o erythema, swelling, exudates. Oropharynx clear without erythema or exudates. Oral hygiene is good. Tongue normal, non obstructing. Hearing intact.  Neck: Supple. Thyroid nl. Car 2+/2+ without bruits, nodes or JVD. Chest: Respirations nl with BS clear & equal w/o rales, rhonchi, wheezing or stridor.  Cor: Heart sounds normal w/ regular rate and rhythm without sig. murmurs, gallops, clicks, or rubs. Peripheral pulses normal and equal  without edema.  Abdomen: Soft & bowel sounds normal. Non-tender w/o guarding, rebound, hernias, masses, or organomegaly.  Lymphatics: Unremarkable.  Musculoskeletal: Full ROM all peripheral extremities, joint stability, 5/5 strength, and normal gait.  Skin: Warm, dry without exposed rashes, lesions or ecchymosis apparent.  Neuro: Cranial nerves intact, reflexes equal bilaterally. Sensory-motor testing grossly intact. Tendon reflexes grossly intact.  Pysch: Alert & oriented x 3.  Insight and judgement nl & appropriate. No ideations.  Assessment and Plan:  1. Essential hypertension  - TSH  2. Hyperlipidemia  - Lipid panel - TSH  3. Prediabetes  - Hemoglobin A1c - Insulin, random  4. Vitamin D deficiency  - VITAMIN D 25 Hydroxy   5. Medication management  - CBC with Differential/Platelet - BASIC METABOLIC PANEL WITH GFR - Hepatic function panel - [facility]   Recommended regular exercise, BP monitoring, weight control, and discussed med and SE's. Recommended labs to  assess and monitor clinical status. Further disposition pending results of labs. Over 30 minutes of exam, counseling, chart review was performed

## 2015-12-30 NOTE — Patient Instructions (Signed)
Recommend Adult Low Dose Aspirin or   coated  Aspirin 81 mg daily   To reduce risk of Colon Cancer 20 %,   Skin Cancer 26 % ,   Melanoma 46%   and   Pancreatic cancer 60%   ++++++++++++++++++++++++++++++++++++++++++++++++++++++ Vitamin D goal   is between 70-100.   Please make sure that you are taking your Vitamin D as directed.   It is very important as a natural anti-inflammatory   helping hair, skin, and nails, as well as reducing stroke and heart attack risk.   It helps your bones and helps with mood.  It also decreases numerous cancer risks so please take it as directed.   Low Vit D is associated with a 200-300% higher risk for CANCER   and 200-300% higher risk for HEART   ATTACK  &  STROKE.   .....................................Andrea [name]  It is also associated with higher death rate at younger ages,   autoimmune diseases like Rheumatoid arthritis, Lupus, Multiple Sclerosis.     Also many other serious conditions, like depression, Alzheimer's  Dementia, infertility, muscle aches, fatigue, fibromyalgia - just to name a few.  ++++++++++++++++++++++++++++++++++++++++++++++++  Recommend the book "The END of DIETING" by Dr Excell Seltzer   & the book "The END of DIABETES " by Dr Excell Seltzer  At [email or web address] - get book & Audio CD's     Being diabetic has a  300% increased risk for heart attack, stroke, cancer, and alzheimer- type vascular dementia. It is very important that you work harder with diet by avoiding all foods that are white. Avoid white rice (brown & wild rice is OK), white potatoes (sweetpotatoes in moderation is OK), White bread or wheat bread or anything made out of white flour like bagels, donuts, rolls, buns, biscuits, cakes, pastries, cookies, pizza crust, and pasta (made from white flour & egg whites) - vegetarian pasta or spinach or wheat pasta is OK. Multigrain breads like Arnold's or Pepperidge Farm, or multigrain sandwich thins or flatbreads.  Diet,  exercise and weight loss can reverse and cure diabetes in the early stages.  Diet, exercise and weight loss is very important in the control and prevention of complications of diabetes which affects every system in your body, ie. Brain - dementia/stroke, eyes - glaucoma/blindness, heart - heart attack/heart failure, kidneys - dialysis, stomach - gastric paralysis, intestines - malabsorption, nerves - severe painful neuritis, circulation - gangrene & loss of a leg(s), and finally cancer and Alzheimers.    I recommend avoid fried & greasy foods,  sweets/candy, white rice (brown or wild rice or Quinoa is OK), white potatoes (sweet potatoes are OK) - anything made from white flour - bagels, doughnuts, rolls, buns, biscuits,white and wheat breads, pizza crust and traditional pasta made of white flour & egg white(vegetarian pasta or spinach or wheat pasta is OK).  Multi-grain bread is OK - like multi-grain flat bread or sandwich thins. Avoid alcohol in excess. Exercise is also important.    Eat all the vegetables you want - avoid meat, especially red meat and dairy - especially cheese.  Cheese is the most concentrated form of trans-fats which is the worst thing to clog up our arteries. Veggie cheese is OK which can be found in the fresh produce section at [employer] or [employer] or [employer]  ++++++++++++++++++++++++++++++++++++++++++++++++++ DASH Eating Plan  DASH stands for "Dietary Approaches to Stop Hypertension."   The DASH eating plan is a healthy eating plan that has been shown to reduce high blood  pressure (hypertension). Additional health benefits may include reducing the risk of type 2 diabetes mellitus, heart disease, and stroke. The DASH eating plan may also help with weight loss.  WHAT DO I NEED TO KNOW ABOUT THE DASH EATING PLAN?  For the DASH eating plan, you will follow these general guidelines:  Choose foods with a percent daily value for sodium of less than 5% (as listed on the food  label).  Use salt-free seasonings or herbs instead of table salt or sea salt.  Check with your health care provider or pharmacist before using salt substitutes.  Eat lower-sodium products, often labeled as "lower sodium" or "no salt added."  Eat fresh foods.  Eat more vegetables, fruits, and low-fat dairy products.    Choose whole grains. Look for the word "whole" as the first word in the ingredient list.  Choose fish   Limit sweets, desserts, sugars, and sugary drinks.  Choose heart-healthy fats.  Eat veggie cheese   Eat more home-cooked food and less restaurant, buffet, and fast food.  Limit fried foods.  Huffaker foods using methods other than frying.  Limit canned vegetables. If you do use them, rinse them well to decrease the sodium.  When eating at a restaurant, ask that your food be prepared with less salt, or no salt if possible.                      WHAT FOODS CAN I EAT?  Read Dr Fara Olden Fuhrman's books on The End of Dieting & The End of Diabetes  Grains  Whole grain or whole wheat bread. Brown rice. Whole grain or whole wheat pasta. Quinoa, bulgur, and whole grain cereals. Low-sodium cereals. Corn or whole wheat flour tortillas. Whole grain cornbread. Whole grain crackers. Low-sodium crackers.  Vegetables  Fresh or frozen vegetables (raw, steamed, roasted, or grilled). Low-sodium or reduced-sodium tomato and vegetable juices. Low-sodium or reduced-sodium tomato sauce and paste. Low-sodium or reduced-sodium canned vegetables.   Fruits  All fresh, canned (in natural juice), or frozen fruits.  Protein Products   All fish and seafood.  Dried beans, peas, or lentils. Unsalted nuts and seeds. Unsalted canned beans.  Dairy  Low-fat dairy products, such as skim or 1% milk, 2% or reduced-fat cheeses, low-fat ricotta or cottage cheese, or plain low-fat yogurt. Low-sodium or reduced-sodium cheeses.  Fats and Oils  Tub margarines without trans fats. Light or  reduced-fat mayonnaise and salad dressings (reduced sodium). Avocado. Safflower, olive, or canola oils. Natural peanut or almond butter.  Other  Unsalted popcorn and pretzels. The items listed above may not be a complete list of recommended foods or beverages. Contact your dietitian for more options.  +++++++++++++++++++++++++++++++++++++++++++  WHAT FOODS ARE NOT RECOMMENDED?  Grains/ White flour or wheat flour  White bread. White pasta. White rice. Refined cornbread. Bagels and croissants. Crackers that contain trans fat.  Vegetables  Creamed or fried vegetables. Vegetables in a . Regular canned vegetables. Regular canned tomato sauce and paste. Regular tomato and vegetable juices.  Fruits  Dried fruits. Canned fruit in light or heavy syrup. Fruit juice.  Meat and Other Protein Products  Meat in general - RED mwaet & White meat.  Fatty cuts of meat. Ribs, chicken wings, bacon, sausage, bologna, salami, chitterlings, fatback, hot dogs, bratwurst, and packaged luncheon meats.  Dairy  Whole or 2% milk, cream, half-and-half, and cream cheese. Whole-fat or sweetened yogurt. Full-fat cheeses or blue cheese. Nondairy creamers and whipped toppings. Processed cheese, cheese spreads, or  cheese curds.  Condiments  Onion and garlic salt, seasoned salt, table salt, and sea salt. Canned and packaged gravies. Worcestershire sauce. Tartar sauce. Barbecue sauce. Teriyaki sauce. Soy sauce, including reduced sodium. Steak sauce. Fish sauce. Oyster sauce. Cocktail sauce. Horseradish. Ketchup and mustard. Meat flavorings and tenderizers. Bouillon cubes. Hot sauce. Tabasco sauce. Marinades. Taco seasonings. Relishes.  Fats and Oils Butter, stick margarine, lard, shortening and bacon fat. Coconut, palm kernel, or palm oils. Regular salad dressings.  Pickles and olives. Salted popcorn and pretzels.  The items listed above may not be a complete list of foods and beverages to avoid.

## 2015-12-31 LAB — INSULIN, RANDOM: Insulin: 6.1 u[IU]/mL (ref 2.0–19.6)

## 2015-12-31 LAB — VITAMIN D 25 HYDROXY (VIT D DEFICIENCY, FRACTURES): Vit D, 25-Hydroxy: 43 ng/mL (ref 30–100)

## 2016-01-01 ENCOUNTER — Telehealth: Payer: Self-pay | Admitting: Cardiology

## 2016-01-01 NOTE — Telephone Encounter (Signed)
Pt says her Amlodipine was increased,but leg are still swelling. They have been swelling for the last 3 or 4 days. Please call to advise.

## 2016-01-02 NOTE — Telephone Encounter (Signed)
Left message for pt to call

## 2016-01-04 DIAGNOSIS — J449 Chronic obstructive pulmonary disease, unspecified: Secondary | ICD-10-CM | POA: Diagnosis not present

## 2016-01-05 NOTE — Telephone Encounter (Signed)
Left message for pt to call back.

## 2016-01-07 ENCOUNTER — Ambulatory Visit: Payer: Self-pay | Admitting: Internal Medicine

## 2016-01-08 ENCOUNTER — Ambulatory Visit (HOSPITAL_COMMUNITY)
Admission: RE | Admit: 2016-01-08 | Discharge: 2016-01-08 | Disposition: A | Discharge status: Home / Self Care | Payer: Medicare Other | Source: Ambulatory Visit | Attending: Cardiology | Admitting: Cardiology

## 2016-01-08 ENCOUNTER — Ambulatory Visit (INDEPENDENT_AMBULATORY_CARE_PROVIDER_SITE_OTHER): Payer: Medicare Other | Admitting: Internal Medicine

## 2016-01-08 ENCOUNTER — Telehealth: Payer: Self-pay | Admitting: Internal Medicine

## 2016-01-08 ENCOUNTER — Encounter: Payer: Self-pay | Admitting: Internal Medicine

## 2016-01-08 ENCOUNTER — Ambulatory Visit: Payer: Self-pay | Admitting: Internal Medicine

## 2016-01-08 VITALS — BP 130/60 | HR 72 | Temp 97.9°F | Resp 16 | Ht 63.0 in | Wt 103.4 lb

## 2016-01-08 DIAGNOSIS — S0181XA Laceration without foreign body of other part of head, initial encounter: Secondary | ICD-10-CM | POA: Diagnosis not present

## 2016-01-08 DIAGNOSIS — E785 Hyperlipidemia, unspecified: Secondary | ICD-10-CM | POA: Insufficient documentation

## 2016-01-08 DIAGNOSIS — I10 Essential (primary) hypertension: Secondary | ICD-10-CM | POA: Diagnosis not present

## 2016-01-08 DIAGNOSIS — M7989 Other specified soft tissue disorders: Secondary | ICD-10-CM | POA: Diagnosis not present

## 2016-01-08 MED ORDER — FUROSEMIDE 40 MG PO TABS: ORAL_TABLET | ORAL | Status: DC

## 2016-01-08 NOTE — Progress Notes (Signed)
Subjective:    Patient ID: Andrea [name], female    DOB: 1928/12/13, 82 y.o.   MRN: [id number]  HPI  Patient presents to the office for multiple complaints.  Her first complaint is that she has been having worsening swelling of her bilateral ankles.  She reports that this has been going of for the past 2 weeks.  She notes that her right leg is a lot worse than her left leg.  She did recently travel to [place] to visit family.  She does note some pain behind her right knee.  She does currently take a fluid pill several times a week.  No SOB, no CP, no PND or orthopnea  She also reports that she fell last night.  She was getting ready for bed and tripped over her bed slippers.  She did hit her head.  No LOC.  No blood thinners.  No headaches, blurry vision, confusion, nausea or vomiting.  She did have a cut above the left eye brow and also a bruise to the left elbow.    Review of Systems  Constitutional: Negative for fever, chills and fatigue.  HENT: Positive for facial swelling. Negative for congestion, ear pain, mouth sores, postnasal drip, rhinorrhea, sinus pressure and trouble swallowing.   Eyes: Negative for visual disturbance.  Respiratory: Negative for chest tightness and shortness of breath.   Cardiovascular: Positive for leg swelling. Negative for chest pain and palpitations.  Gastrointestinal: Negative for nausea and vomiting.  Genitourinary: Negative for dysuria, urgency, hematuria and difficulty urinating.  Skin: Positive for wound.  Neurological: Negative for dizziness, weakness, light-headedness and headaches.  Psychiatric/Behavioral: Negative for confusion.       Objective:   Physical Exam  Constitutional: She is oriented to person, place, and time. She appears well-developed and well-nourished. No distress.  HENT:  Head: Normocephalic.    Mouth/Throat: Oropharynx is clear and moist. No oropharyngeal exudate.  Eyes: Conjunctivae and EOM are normal. Pupils are equal,  round, and reactive to light. No scleral icterus.  Neck: Normal range of motion. Neck supple.  Cardiovascular: Normal rate, regular rhythm, normal heart sounds and intact distal pulses.  Exam reveals no gallop and no friction rub.   No murmur heard. Right leg with pretibial swelling to the tibial tuberosity without significant pitting.  Mild redness.  Swelling to the left ankle.  Swelling is not symmetric.  Non-tender right calf.  Negative holmans sign.  Right knee without popliteal tenderness.    Pulmonary/Chest: Effort normal and breath sounds normal. No respiratory distress. She has no wheezes. She has no rales. She exhibits no tenderness.  Musculoskeletal: Normal range of motion.       Left elbow: She exhibits normal range of motion, no swelling, no effusion, no deformity and no laceration. No tenderness found. No radial head, no medial epicondyle, no lateral epicondyle and no olecranon process tenderness noted.       Arms: Neurological: She is alert and oriented to person, place, and time.  Skin: Skin is warm and dry. She is not diaphoretic.  Psychiatric: She has a normal mood and affect. Her behavior is normal. Judgment and thought content normal.  Nursing note and vitals reviewed.   Filed Vitals:   01/08/16 1028  BP: 130/60  Pulse: 72  Temp: 97.9 F (36.6 C)  Resp: 16         Assessment & Plan:    1. Right leg swelling - VAS [place] LOWER EXTREMITY VENOUS (DVT); Future -rule out DVT given  recent travel -xarelto given and if DVT study today positive will start on it.   -if negative consider d/c maxide and use 40 mg lasix prn for leg swelling. -avoid salt  2. Facial Laceration -without signs of infection -beyond window for use of dermabond -keep clean and dry

## 2016-01-08 NOTE — Telephone Encounter (Signed)
Patient had negative DVT study.  Will ask her to not start xarelto.  Stop maxzide.  Will start 40 mg lasix po daily for leg swelling.

## 2016-01-29 DIAGNOSIS — M25461 Effusion, right knee: Secondary | ICD-10-CM | POA: Diagnosis not present

## 2016-01-29 DIAGNOSIS — M25561 Pain in right knee: Secondary | ICD-10-CM | POA: Diagnosis not present

## 2016-02-03 DIAGNOSIS — J449 Chronic obstructive pulmonary disease, unspecified: Secondary | ICD-10-CM | POA: Diagnosis not present

## 2016-02-09 ENCOUNTER — Other Ambulatory Visit: Payer: Self-pay | Admitting: Internal Medicine

## 2016-03-05 DIAGNOSIS — J449 Chronic obstructive pulmonary disease, unspecified: Secondary | ICD-10-CM | POA: Diagnosis not present

## 2016-04-01 DIAGNOSIS — M25561 Pain in right knee: Secondary | ICD-10-CM | POA: Diagnosis not present

## 2016-04-04 DIAGNOSIS — J449 Chronic obstructive pulmonary disease, unspecified: Secondary | ICD-10-CM | POA: Diagnosis not present

## 2016-04-07 ENCOUNTER — Encounter: Payer: Self-pay | Admitting: Internal Medicine

## 2016-04-07 ENCOUNTER — Ambulatory Visit (INDEPENDENT_AMBULATORY_CARE_PROVIDER_SITE_OTHER): Payer: Medicare Other | Admitting: Internal Medicine

## 2016-04-07 VITALS — BP 156/60 | HR 72 | Temp 98.0°F | Resp 16 | Ht 63.0 in | Wt 102.0 lb

## 2016-04-07 DIAGNOSIS — M810 Age-related osteoporosis without current pathological fracture: Secondary | ICD-10-CM | POA: Diagnosis not present

## 2016-04-07 DIAGNOSIS — I351 Nonrheumatic aortic (valve) insufficiency: Secondary | ICD-10-CM

## 2016-04-07 DIAGNOSIS — M19019 Primary osteoarthritis, unspecified shoulder: Secondary | ICD-10-CM | POA: Diagnosis not present

## 2016-04-07 DIAGNOSIS — E782 Mixed hyperlipidemia: Secondary | ICD-10-CM | POA: Diagnosis not present

## 2016-04-07 DIAGNOSIS — I10 Essential (primary) hypertension: Secondary | ICD-10-CM | POA: Diagnosis not present

## 2016-04-07 DIAGNOSIS — J432 Centrilobular emphysema: Secondary | ICD-10-CM | POA: Diagnosis not present

## 2016-04-07 DIAGNOSIS — E039 Hypothyroidism, unspecified: Secondary | ICD-10-CM | POA: Diagnosis not present

## 2016-04-07 DIAGNOSIS — R6889 Other general symptoms and signs: Secondary | ICD-10-CM

## 2016-04-07 DIAGNOSIS — R002 Palpitations: Secondary | ICD-10-CM

## 2016-04-07 DIAGNOSIS — C4490 Unspecified malignant neoplasm of skin, unspecified: Secondary | ICD-10-CM

## 2016-04-07 DIAGNOSIS — Z0000 Encounter for general adult medical examination without abnormal findings: Secondary | ICD-10-CM

## 2016-04-07 DIAGNOSIS — Z0001 Encounter for general adult medical examination with abnormal findings: Secondary | ICD-10-CM | POA: Diagnosis not present

## 2016-04-07 DIAGNOSIS — R918 Other nonspecific abnormal finding of lung field: Secondary | ICD-10-CM

## 2016-04-07 DIAGNOSIS — R7303 Prediabetes: Secondary | ICD-10-CM

## 2016-04-07 DIAGNOSIS — E46 Unspecified protein-calorie malnutrition: Secondary | ICD-10-CM

## 2016-04-07 DIAGNOSIS — I340 Nonrheumatic mitral (valve) insufficiency: Secondary | ICD-10-CM | POA: Diagnosis not present

## 2016-04-07 DIAGNOSIS — Z79899 Other long term (current) drug therapy: Secondary | ICD-10-CM | POA: Diagnosis not present

## 2016-04-07 DIAGNOSIS — E559 Vitamin D deficiency, unspecified: Secondary | ICD-10-CM | POA: Diagnosis not present

## 2016-04-07 LAB — HEPATIC FUNCTION PANEL
ALT: 9 U/L (ref 6–29)
AST: 21 U/L (ref 10–35)
Albumin: 4.1 g/dL (ref 3.6–5.1)
Alkaline Phosphatase: 82 U/L (ref 33–130)
BILIRUBIN DIRECT: 0.1 mg/dL (ref <=0.2)
BILIRUBIN INDIRECT: 0.4 mg/dL (ref 0.2–1.2)
Total Bilirubin: 0.5 mg/dL (ref 0.2–1.2)
Total Protein: 6.9 g/dL (ref 6.1–8.1)

## 2016-04-07 LAB — CBC WITH DIFFERENTIAL/PLATELET
Basophils Absolute: 0 cells/uL (ref 0–200)
Basophils Relative: 0 %
EOS PCT: 5 %
Eosinophils Absolute: 365 cells/uL (ref 15–500)
HCT: 37.8 % (ref 35.0–45.0)
HEMOGLOBIN: 12.4 g/dL (ref 11.7–15.5)
LYMPHS ABS: 1387 {cells}/uL (ref 850–3900)
Lymphocytes Relative: 19 %
MCH: 30.2 pg (ref 27.0–33.0)
MCHC: 32.8 g/dL (ref 32.0–36.0)
MCV: 92 fL (ref 80.0–100.0)
MPV: 11.1 fL (ref 7.5–12.5)
Monocytes Absolute: 730 cells/uL (ref 200–950)
Monocytes Relative: 10 %
NEUTROS ABS: 4818 {cells}/uL (ref 1500–7800)
NEUTROS PCT: 66 %
Platelets: 220 10*3/uL (ref 140–400)
RBC: 4.11 MIL/uL (ref 3.80–5.10)
RDW: 12.8 % (ref 11.0–15.0)
WBC: 7.3 10*3/uL (ref 3.8–10.8)

## 2016-04-07 LAB — BASIC METABOLIC PANEL WITH GFR
BUN: 17 mg/dL (ref 7–25)
CALCIUM: 9.3 mg/dL (ref 8.6–10.4)
CO2: 34 mmol/L — AB (ref 20–31)
Chloride: 96 mmol/L — ABNORMAL LOW (ref 98–110)
Creat: 0.79 mg/dL (ref 0.60–0.88)
GFR, EST AFRICAN AMERICAN: 79 mL/min (ref >=60)
GFR, EST NON AFRICAN AMERICAN: 68 mL/min (ref >=60)
Glucose, Bld: 79 mg/dL (ref 65–99)
Potassium: 4.3 mmol/L (ref 3.5–5.3)
SODIUM: 137 mmol/L (ref 135–146)

## 2016-04-07 LAB — LIPID PANEL
Cholesterol: 200 mg/dL (ref 125–200)
HDL: 97 mg/dL (ref >=46)
LDL CALC: 89 mg/dL (ref <130)
TRIGLYCERIDES: 72 mg/dL (ref <150)
Total CHOL/HDL Ratio: 2.1 Ratio (ref <=5.0)
VLDL: 14 mg/dL (ref <30)

## 2016-04-07 NOTE — Progress Notes (Signed)
MEDICARE ANNUAL WELLNESS VISIT AND FOLLOW UP  Assessment:    1. Essential hypertension -recheck normal -diet and exercise -monitor at home -call office if elevated greater than 150/90 at home - TSH  2. Hypothyroidism, unspecified hypothyroidism type -cont levothyroxine - TSH  3. Hyperlipidemia -cont diet and exercise - Lipid panel  4. Prediabetes -cont diet and exercise - Hemoglobin A1c  5. Vitamin D deficiency -cont VIt D  6. Medication management  - CBC with Differential/Platelet - BASIC METABOLIC PANEL WITH GFR - Hepatic function panel  7. Mild mitral regurgitation by prior echocardiogram -followed by echo  8. Aortic insufficiency -followed by echo and cards  9. Centrilobular emphysema ([place]) -stopped smoking -inhalers prn  10. Osteoarthritis of shoulder, unspecified laterality, unspecified osteoarthritis type -antiinflammatory sparingly -tylenol as first line  11. Skin cancer -followed by derm  12. Osteoporosis -cont calcium and Vit D  13. Lung nodules -screening CXR yearly  14. Palpitations -followed by cards  15. Malnutrition ([place]) -cont dietary supplements like boost     Over 30 minutes of exam, counseling, chart review, and critical decision making was performed  Future Appointments Date Time Provider [place]  07/19/2016 10:30 AM Vicie Mutters, PA-C [facility] None  10/04/2016 3:00 PM Vicie Mutters, PA-C [facility] None    Plan:   During the course of the visit the patient was educated and counseled about appropriate screening and preventive services including:    Pneumococcal vaccine   Influenza vaccine  Td vaccine  Prevnar 13  Screening electrocardiogram  Screening mammography  Bone densitometry screening  Colorectal cancer screening  Diabetes screening  Glaucoma screening  Nutrition counseling   Advanced directives: given info/requested copies   Subjective:   Andrea [name] is a 82 y.o. female  who presents for Medicare Annual Wellness Visit and 3 month follow up on hypertension, prediabetes, hyperlipidemia, vitamin D def.   Her blood pressure has been controlled at home, today their BP is BP: (!) 156/60 mmHg She does workout. She denies chest pain, shortness of breath, dizziness.   She is on cholesterol medication and denies myalgias. Her cholesterol is at goal. The cholesterol last visit was:   Lab Results  Component Value Date   CHOL 200 04/07/2016   HDL 97 04/07/2016   LDLCALC 89 04/07/2016   TRIG 72 04/07/2016   CHOLHDL 2.1 04/07/2016   She has a history of diet controlled prediabetes.  She has been doing well with her diet.  No polydipsia, polyuria, or polyphagia :  Lab Results  Component Value Date   HGBA1C 5.9* 04/07/2016   Last GFR Lab Results  Component Value Date   GFRNONAA 68 04/07/2016   Lab Results  Component Value Date   GFRAA 79 04/07/2016   Patient is on Vitamin D supplement. Lab Results  Component Value Date   VD25OH 43 12/30/2015     She notes that she has been itching around her hair line.  She reports that it also goes around her right arm as well.  She has no changes in soaps lotions detergents or shampoos.    Medication Review Current Outpatient Prescriptions on File Prior to Visit  Medication Sig Dispense Refill  . ALPRAZolam (XANAX) 0.5 MG tablet Take 1/2 to 1 tablet 3 x day if needed for nerves 270 tablet 1  . amLODipine (NORVASC) 10 MG tablet Take 1 tablet (10 mg total) by mouth daily. 90 tablet 3  . Ascorbic Acid (VITAMIN C) 500 MG tablet Take 500 mg by mouth daily.      [name]  atenolol (TENORMIN) 100 MG tablet TAKE ONE TABLET ONCE DAILY 90 tablet 1  . cholecalciferol (VITAMIN D) 1000 UNITS tablet Take 1,000 Units by mouth daily.     [name] levothyroxine (SYNTHROID, LEVOTHROID) 50 MCG tablet Take 1 tablet daily or as directed 90 tablet 1  . losartan (COZAAR) 100 MG tablet TAKE ONE-HALF TABLET IN THE MORNING AND ONE-HALF TABLET AT NIGHT 90  tablet 1  . Magnesium 500 MG TABS Take 1 tablet by mouth daily.    [name] omeprazole (PRILOSEC) 40 MG capsule TAKE ONE (1) CAPSULE EACH DAY 90 capsule 1  . furosemide (LASIX) 40 MG tablet Take 1 tablet by mouth daily with breakfast for leg swelling. (Patient not taking: Reported on 04/07/2016) 30 tablet 11  . triamterene-hydrochlorothiazide (MAXZIDE) 75-50 MG per tablet TAKE 1/2 TO ONE TABLET ONCE DAILY FOR BLOOD PRESSURE AND FLUID (Patient taking differently: TAKE 1/2 TO ONE TABLET 2-3 TIMES WEEKLY.) 30 tablet 1   No current facility-administered medications on file prior to visit.    Allergies: Allergies  Allergen Reactions  . Sulfa Antibiotics Other (See Comments)    Unknown    Current Problems (verified) has Essential hypertension; COPD with emphysema ([place]); Lung nodules; DJD (degenerative joint disease); Mild mitral regurgitation by prior echocardiogram; Hyperlipidemia; Prediabetes; Vitamin D deficiency; Medication management; Palpitations; Aortic insufficiency; Skin cancer; Malnutrition ([place]); Hypothyroidism; and Osteoporosis on her problem list.  Screening Tests Immunization History  Administered Date(s) Administered  . Influenza Split 07/16/2013  . Influenza, High Dose Seasonal PF 06/25/2015  . Influenza,inj,Quad PF,36+ Mos 07/04/2014  . Pneumococcal Conjugate-13 02/26/2014  . Td 06/26/2013    Preventative care: Last colonoscopy: Refused Last mammogram: 2015 [id number]  Prior vaccinations: TD or Tdap: 2014  Influenza: 2016  Pneumococcal: 2008 Prevnar13: 2015 Shingles/Zostavax: Declined  Names of Other Physician/Practitioners you currently use: 1. [facility]- here for primary care 2. Not currently seeing one, eye doctor, last visit  3. Not currently seeing one, dentist, last visit  Patient Care Team: Unk Pinto, MD as PCP - General (Internal Medicine) Deneise Lever, MD as Consulting Physician (Pulmonary Disease) Lelon Perla, MD as Consulting Physician (Cardiology) Inda Castle, MD as Consulting Physician (Gastroenterology) Rolm Bookbinder, MD as Consulting Physician (Dermatology)  Surgical: She  has past surgical history that includes Abdominal hysterectomy (1993) and Cataract extraction, bilateral. Family Her family history includes Asthma in her brother; Cancer in her father; Diabetes in her brother, mother, and sister; Emphysema in her mother, sister, and sister; Heart attack in her sister; Rheumatic fever in her sister. Social history  She reports that she quit smoking about 41 years ago. Her smoking use included Cigarettes. She has a 20 pack-year smoking history. She has never used smokeless tobacco. She reports that she does not drink alcohol or use illicit drugs.  MEDICARE WELLNESS OBJECTIVES: Physical activity: Current Exercise Habits: Home exercise routine Cardiac risk factors:   Depression/mood screen:   Depression screen [facility] 2/9 04/07/2016  Decreased Interest 0  Down, Depressed, Hopeless 0  PHQ - 2 Score 0    ADLs:  In your present state of health, do you have any difficulty performing the following activities: 04/07/2016 12/30/2015  Hearing? N N  Vision? N N  Difficulty concentrating or making decisions? N N  Walking or climbing stairs? Y N  Dressing or bathing? N N  Doing errands, shopping? N N  Preparing Food and eating ? N -  Using the Toilet? N -  In the past six  months, have you accidently leaked urine? N -  Do you have problems with loss of bowel control? N -  Managing your Medications? N -  Managing your Finances? N -  Housekeeping or managing your Housekeeping? N -     Cognitive Testing  Alert? Yes  Normal Appearance?Yes  Oriented to person? Yes  Place? Yes   Time? Yes  Recall of three objects?  Yes  Can perform simple calculations? Yes  Displays appropriate judgment?Yes  Can read the correct time from a watch face?Yes  EOL planning: Does patient have an advance  directive?: Yes Type of Advance Directive: [place] will Does patient want to make changes to advanced directive?: No - Patient declined   Objective:   Today's Vitals   04/07/16 1051  BP: 156/60  Pulse: 72  Temp: 98 F (36.7 C)  TempSrc: Temporal  Resp: 16  Height: _0  (1.6 m)  Weight: 102 lb (46.267 kg)   Body mass index is 18.07 kg/(m^2).  General appearance: alert, no distress, WD/WN,  female HEENT: normocephalic, sclerae anicteric, TMs pearly, nares patent, no discharge or erythema, pharynx normal Oral cavity: MMM, no lesions Neck: supple, no lymphadenopathy, no thyromegaly, no masses Heart: RRR, normal S1, S2, no murmurs Lungs: CTA bilaterally, no wheezes, rhonchi, or rales Abdomen: +bs, soft, non tender, non distended, no masses, no hepatomegaly, no splenomegaly Musculoskeletal: nontender, no swelling, no obvious deformity Extremities: no edema, no cyanosis, no clubbing Pulses: 2+ symmetric, upper and lower extremities, normal cap refill Neurological: alert, oriented x 3, CN2-12 intact, strength normal upper extremities and lower extremities, sensation normal throughout, DTRs 2+ throughout, no cerebellar signs, gait normal Psychiatric: normal affect, behavior normal, pleasant  Breast: defer Gyn: defer Rectal: defer   Medicare Attestation I have personally reviewed: The patient's medical and social history Their use of alcohol, tobacco or illicit drugs Their current medications and supplements The patient's functional ability including ADLs,fall risks, home safety risks, cognitive, and hearing and visual impairment Diet and physical activities Evidence for depression or mood disorders  The patient's weight, height, BMI, and visual acuity have been recorded in the chart.  I have made referrals, counseling, and provided education to the patient based on review of the above and I have provided the patient with a written personalized care  plan for preventive services.     Starlyn Skeans, PA-C   04/08/2016

## 2016-04-08 LAB — HEMOGLOBIN A1C
Hgb A1c MFr Bld: 5.9 % — ABNORMAL HIGH (ref <5.7)
Mean Plasma Glucose: 123 mg/dL

## 2016-04-08 LAB — TSH: TSH: 2.19 mIU/L

## 2016-04-19 ENCOUNTER — Emergency Department (HOSPITAL_COMMUNITY): Payer: Medicare Other

## 2016-04-19 ENCOUNTER — Emergency Department (HOSPITAL_COMMUNITY)
Admission: EM | Admit: 2016-04-19 | Discharge: 2016-04-19 | Disposition: A | Discharge status: Home / Self Care | Payer: Medicare Other | Source: Home / Self Care

## 2016-04-19 ENCOUNTER — Inpatient Hospital Stay (HOSPITAL_COMMUNITY)
Admission: EM | Admit: 2016-04-19 | Discharge: 2016-04-25 | DRG: 190 | Disposition: A | Discharge status: Home Health | Payer: Medicare Other | Attending: Internal Medicine | Admitting: Internal Medicine

## 2016-04-19 ENCOUNTER — Ambulatory Visit (INDEPENDENT_AMBULATORY_CARE_PROVIDER_SITE_OTHER): Payer: Medicare Other | Admitting: Physician Assistant

## 2016-04-19 ENCOUNTER — Encounter: Payer: Self-pay | Admitting: Physician Assistant

## 2016-04-19 ENCOUNTER — Encounter (HOSPITAL_COMMUNITY): Payer: Self-pay | Admitting: Emergency Medicine

## 2016-04-19 VITALS — BP 122/60 | HR 105 | Temp 99.7°F | Resp 16 | Ht 63.0 in | Wt 98.0 lb

## 2016-04-19 DIAGNOSIS — Z833 Family history of diabetes mellitus: Secondary | ICD-10-CM | POA: Diagnosis not present

## 2016-04-19 DIAGNOSIS — Z87891 Personal history of nicotine dependence: Secondary | ICD-10-CM

## 2016-04-19 DIAGNOSIS — Z5321 Procedure and treatment not carried out due to patient leaving prior to being seen by health care provider: Secondary | ICD-10-CM | POA: Insufficient documentation

## 2016-04-19 DIAGNOSIS — N179 Acute kidney failure, unspecified: Secondary | ICD-10-CM | POA: Diagnosis not present

## 2016-04-19 DIAGNOSIS — R0600 Dyspnea, unspecified: Secondary | ICD-10-CM | POA: Diagnosis present

## 2016-04-19 DIAGNOSIS — E871 Hypo-osmolality and hyponatremia: Secondary | ICD-10-CM | POA: Diagnosis not present

## 2016-04-19 DIAGNOSIS — N183 Chronic kidney disease, stage 3 unspecified: Secondary | ICD-10-CM | POA: Insufficient documentation

## 2016-04-19 DIAGNOSIS — J9601 Acute respiratory failure with hypoxia: Secondary | ICD-10-CM

## 2016-04-19 DIAGNOSIS — R000 Tachycardia, unspecified: Secondary | ICD-10-CM | POA: Diagnosis present

## 2016-04-19 DIAGNOSIS — J918 Pleural effusion in other conditions classified elsewhere: Secondary | ICD-10-CM | POA: Diagnosis not present

## 2016-04-19 DIAGNOSIS — J449 Chronic obstructive pulmonary disease, unspecified: Secondary | ICD-10-CM | POA: Insufficient documentation

## 2016-04-19 DIAGNOSIS — R070 Pain in throat: Secondary | ICD-10-CM | POA: Diagnosis not present

## 2016-04-19 DIAGNOSIS — I5032 Chronic diastolic (congestive) heart failure: Secondary | ICD-10-CM | POA: Diagnosis not present

## 2016-04-19 DIAGNOSIS — R002 Palpitations: Secondary | ICD-10-CM | POA: Diagnosis not present

## 2016-04-19 DIAGNOSIS — Z79899 Other long term (current) drug therapy: Secondary | ICD-10-CM

## 2016-04-19 DIAGNOSIS — Z825 Family history of asthma and other chronic lower respiratory diseases: Secondary | ICD-10-CM

## 2016-04-19 DIAGNOSIS — I130 Hypertensive heart and chronic kidney disease with heart failure and stage 1 through stage 4 chronic kidney disease, or unspecified chronic kidney disease: Secondary | ICD-10-CM | POA: Diagnosis not present

## 2016-04-19 DIAGNOSIS — K219 Gastro-esophageal reflux disease without esophagitis: Secondary | ICD-10-CM | POA: Diagnosis not present

## 2016-04-19 DIAGNOSIS — D649 Anemia, unspecified: Secondary | ICD-10-CM | POA: Diagnosis present

## 2016-04-19 DIAGNOSIS — I10 Essential (primary) hypertension: Secondary | ICD-10-CM | POA: Diagnosis present

## 2016-04-19 DIAGNOSIS — R0602 Shortness of breath: Secondary | ICD-10-CM

## 2016-04-19 DIAGNOSIS — J189 Pneumonia, unspecified organism: Secondary | ICD-10-CM | POA: Diagnosis present

## 2016-04-19 DIAGNOSIS — E43 Unspecified severe protein-calorie malnutrition: Secondary | ICD-10-CM | POA: Diagnosis not present

## 2016-04-19 DIAGNOSIS — R531 Weakness: Secondary | ICD-10-CM | POA: Diagnosis not present

## 2016-04-19 DIAGNOSIS — J90 Pleural effusion, not elsewhere classified: Secondary | ICD-10-CM | POA: Diagnosis not present

## 2016-04-19 DIAGNOSIS — E039 Hypothyroidism, unspecified: Secondary | ICD-10-CM | POA: Diagnosis present

## 2016-04-19 DIAGNOSIS — J441 Chronic obstructive pulmonary disease with (acute) exacerbation: Secondary | ICD-10-CM | POA: Diagnosis not present

## 2016-04-19 DIAGNOSIS — J440 Chronic obstructive pulmonary disease with acute lower respiratory infection: Principal | ICD-10-CM | POA: Diagnosis present

## 2016-04-19 DIAGNOSIS — J439 Emphysema, unspecified: Secondary | ICD-10-CM | POA: Diagnosis present

## 2016-04-19 DIAGNOSIS — J9621 Acute and chronic respiratory failure with hypoxia: Secondary | ICD-10-CM | POA: Diagnosis present

## 2016-04-19 DIAGNOSIS — E785 Hyperlipidemia, unspecified: Secondary | ICD-10-CM | POA: Diagnosis not present

## 2016-04-19 DIAGNOSIS — Z9981 Dependence on supplemental oxygen: Secondary | ICD-10-CM | POA: Diagnosis not present

## 2016-04-19 DIAGNOSIS — Z90710 Acquired absence of both cervix and uterus: Secondary | ICD-10-CM

## 2016-04-19 DIAGNOSIS — Z8249 Family history of ischemic heart disease and other diseases of the circulatory system: Secondary | ICD-10-CM

## 2016-04-19 DIAGNOSIS — Z809 Family history of malignant neoplasm, unspecified: Secondary | ICD-10-CM

## 2016-04-19 DIAGNOSIS — J438 Other emphysema: Secondary | ICD-10-CM | POA: Diagnosis not present

## 2016-04-19 HISTORY — DX: Chronic obstructive pulmonary disease, unspecified: J44.9

## 2016-04-19 HISTORY — DX: Nonrheumatic aortic (valve) insufficiency: I35.1

## 2016-04-19 HISTORY — DX: Bronchiectasis, uncomplicated: J47.9

## 2016-04-19 LAB — CBC WITH DIFFERENTIAL/PLATELET
BASOS PCT: 0 %
Basophils Absolute: 0 10*3/uL (ref 0.0–0.1)
Eosinophils Absolute: 0 10*3/uL (ref 0.0–0.7)
Eosinophils Relative: 0 %
HEMATOCRIT: 39.5 % (ref 36.0–46.0)
Hemoglobin: 12.9 g/dL (ref 12.0–15.0)
Lymphocytes Relative: 4 %
Lymphs Abs: 0.9 10*3/uL (ref 0.7–4.0)
MCH: 30.4 pg (ref 26.0–34.0)
MCHC: 32.7 g/dL (ref 30.0–36.0)
MCV: 93.2 fL (ref 78.0–100.0)
MONO ABS: 1.1 10*3/uL — AB (ref 0.1–1.0)
MONOS PCT: 5 %
NEUTROS ABS: 18.8 10*3/uL — AB (ref 1.7–7.7)
Neutrophils Relative %: 91 %
Platelets: 225 10*3/uL (ref 150–400)
RBC: 4.24 MIL/uL (ref 3.87–5.11)
RDW: 13 % (ref 11.5–15.5)
WBC: 20.8 10*3/uL — ABNORMAL HIGH (ref 4.0–10.5)

## 2016-04-19 LAB — COMPREHENSIVE METABOLIC PANEL
ALBUMIN: 3.5 g/dL (ref 3.5–5.0)
ALK PHOS: 72 U/L (ref 38–126)
ALT: 12 U/L — ABNORMAL LOW (ref 14–54)
ANION GAP: 13 (ref 5–15)
AST: 24 U/L (ref 15–41)
BUN: 26 mg/dL — ABNORMAL HIGH (ref 6–20)
CALCIUM: 9.2 mg/dL (ref 8.9–10.3)
CO2: 26 mmol/L (ref 22–32)
Chloride: 92 mmol/L — ABNORMAL LOW (ref 101–111)
Creatinine, Ser: 1.07 mg/dL — ABNORMAL HIGH (ref 0.44–1.00)
GFR calc non Af Amer: 46 mL/min — ABNORMAL LOW (ref >60)
GFR, EST AFRICAN AMERICAN: 53 mL/min — AB (ref >60)
GLUCOSE: 85 mg/dL (ref 65–99)
POTASSIUM: 4.1 mmol/L (ref 3.5–5.1)
SODIUM: 131 mmol/L — AB (ref 135–145)
Total Bilirubin: 1.2 mg/dL (ref 0.3–1.2)
Total Protein: 7.7 g/dL (ref 6.5–8.1)

## 2016-04-19 LAB — TROPONIN I: Troponin I: <0.03 ng/mL (ref <0.03)

## 2016-04-19 LAB — I-STAT CG4 LACTIC ACID, ED: Lactic Acid, Venous: 1.72 mmol/L (ref 0.5–1.9)

## 2016-04-19 MED ORDER — VITAMIN C 500 MG PO TABS
500.0000 mg | ORAL_TABLET | Freq: Every day | ORAL | Status: DC
  Administered 2016-04-20 – 2016-04-25 (×6): 500 mg via ORAL
  Filled 2016-04-19 (×6): qty 1

## 2016-04-19 MED ORDER — LOSARTAN POTASSIUM 50 MG PO TABS
100.0000 mg | ORAL_TABLET | Freq: Every day | ORAL | Status: DC
  Administered 2016-04-20: 100 mg via ORAL
  Filled 2016-04-19: qty 2

## 2016-04-19 MED ORDER — ENOXAPARIN SODIUM 30 MG/0.3ML ~~LOC~~ SOLN
30.0000 mg | SUBCUTANEOUS | Status: DC
  Administered 2016-04-20 – 2016-04-22 (×3): 30 mg via SUBCUTANEOUS
  Filled 2016-04-19 (×3): qty 0.3

## 2016-04-19 MED ORDER — ENSURE ENLIVE PO LIQD
237.0000 mL | Freq: Two times a day (BID) | ORAL | Status: DC
  Administered 2016-04-20 – 2016-04-25 (×7): 237 mL via ORAL

## 2016-04-19 MED ORDER — PANTOPRAZOLE SODIUM 40 MG PO TBEC
40.0000 mg | DELAYED_RELEASE_TABLET | Freq: Every day | ORAL | Status: DC
  Administered 2016-04-20 – 2016-04-25 (×6): 40 mg via ORAL
  Filled 2016-04-19 (×6): qty 1

## 2016-04-19 MED ORDER — DEXTROSE 5 % IV SOLN
1.0000 g | INTRAVENOUS | Status: DC
  Administered 2016-04-20 – 2016-04-25 (×5): 1 g via INTRAVENOUS
  Filled 2016-04-19 (×7): qty 10

## 2016-04-19 MED ORDER — MAGNESIUM OXIDE 400 (241.3 MG) MG PO TABS
400.0000 mg | ORAL_TABLET | Freq: Every day | ORAL | Status: DC
  Administered 2016-04-20 – 2016-04-25 (×6): 400 mg via ORAL
  Filled 2016-04-19 (×6): qty 1

## 2016-04-19 MED ORDER — LEVOTHYROXINE SODIUM 50 MCG PO TABS
50.0000 ug | ORAL_TABLET | Freq: Every day | ORAL | Status: DC
  Administered 2016-04-20 – 2016-04-25 (×6): 50 ug via ORAL
  Filled 2016-04-19 (×6): qty 1

## 2016-04-19 MED ORDER — ATENOLOL 25 MG PO TABS
50.0000 mg | ORAL_TABLET | Freq: Two times a day (BID) | ORAL | Status: DC
  Administered 2016-04-20: 50 mg via ORAL
  Filled 2016-04-19: qty 2

## 2016-04-19 MED ORDER — FUROSEMIDE 40 MG PO TABS
40.0000 mg | ORAL_TABLET | Freq: Every day | ORAL | Status: DC | PRN
Start: 2016-04-19 — End: 2016-04-20

## 2016-04-19 MED ORDER — TRIAMTERENE-HCTZ 75-50 MG PO TABS
1.0000 | ORAL_TABLET | Freq: Every day | ORAL | Status: DC
  Administered 2016-04-20: 1 via ORAL
  Filled 2016-04-19: qty 1

## 2016-04-19 MED ORDER — SODIUM CHLORIDE 0.9 % IV SOLN
INTRAVENOUS | Status: AC
  Administered 2016-04-19 – 2016-04-20 (×2): via INTRAVENOUS

## 2016-04-19 MED ORDER — AMLODIPINE BESYLATE 5 MG PO TABS
5.0000 mg | ORAL_TABLET | Freq: Every day | ORAL | Status: DC
  Administered 2016-04-20: 5 mg via ORAL
  Filled 2016-04-19: qty 1

## 2016-04-19 MED ORDER — DEXTROSE 5 % IV SOLN
500.0000 mg | INTRAVENOUS | Status: DC
  Administered 2016-04-19 – 2016-04-23 (×5): 500 mg via INTRAVENOUS
  Filled 2016-04-19 (×7): qty 500

## 2016-04-19 MED ORDER — VITAMIN D 1000 UNITS PO TABS
1000.0000 [IU] | ORAL_TABLET | Freq: Every day | ORAL | Status: DC
  Administered 2016-04-20 – 2016-04-25 (×6): 1000 [IU] via ORAL
  Filled 2016-04-19 (×6): qty 1

## 2016-04-19 MED ORDER — ALBUTEROL SULFATE (2.5 MG/3ML) 0.083% IN NEBU: 5.0000 mg | INHALATION_SOLUTION | Freq: Once | RESPIRATORY_TRACT | Status: DC

## 2016-04-19 MED ORDER — DEXTROSE 5 % IV SOLN
1.0000 g | Freq: Once | INTRAVENOUS | Status: AC
  Administered 2016-04-19: 1 g via INTRAVENOUS
  Filled 2016-04-19: qty 10

## 2016-04-19 NOTE — ED Triage Notes (Addendum)
Started feeling bad with sob since yesterday , her husband was dx with pneu yesterday,  She is coming from dr office with sats of 84 % RA NOW SHE HAS SATS OF 78. PT PLACED ON nrb 100% AND EKG DONE ,  NO CP at this time having back and shoulder pain

## 2016-04-19 NOTE — ED Triage Notes (Signed)
Pt c/o sob and weakness starting yesterday, she is coming from [place] when she had been there 3 hours and had not been seen. Pt was seen earlier at pcp and he wanted her transferred to [place] via ems, but family denied. Pt o2 is 85% on room air.

## 2016-04-19 NOTE — Progress Notes (Signed)
Subjective:    Patient ID: Andrea [name], female    DOB: [birth year], 82 y.o.   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 Daughter drove her, history from mother and daughter. States that Sunday started with a  cough, nonproductive, felt chills 1-2 times, daughter states that she had to stop multiple times due to weakness while walking to church, has not eaten for 2 days due to nausea, admits to poor fluid intake as well. She states she had severe right shoulder pain with deep breath/cough last night around 2-5AM, radiating to her center back.  She is on O2 at night 2L, does not use during the day. Had tick bit 2 weeks ago with diffuse myalgias. Negative DVT study in April 2017  Blood pressure 122/60, pulse (!) 105, temperature 99.7 F (37.6 C), temperature source Temporal, resp. rate 16, height _0  (1.6 m), weight 98 lb (44.5 kg), SpO2 (!) 77 %.  Current Outpatient Prescriptions on File Prior to Visit  Medication Sig Dispense Refill  . amLODipine (NORVASC) 10 MG tablet Take 1 tablet (10 mg total) by mouth daily. 90 tablet 3  . Ascorbic Acid (VITAMIN C) 500 MG tablet Take 500 mg by mouth daily.      [name] atenolol (TENORMIN) 100 MG tablet TAKE ONE TABLET ONCE DAILY 90 tablet 1  . cholecalciferol (VITAMIN D) 1000 UNITS tablet Take 1,000 Units by mouth daily.     . furosemide (LASIX) 40 MG tablet Take 1 tablet by mouth daily with breakfast for leg swelling. 30 tablet 11  . levothyroxine (SYNTHROID, LEVOTHROID) 50 MCG tablet Take 1 tablet daily or as directed 90 tablet 1  . losartan (COZAAR) 100 MG tablet TAKE ONE-HALF TABLET IN THE MORNING AND ONE-HALF TABLET AT NIGHT 90 tablet 1  . Magnesium 500 MG TABS Take 1 tablet by mouth daily.    [name] omeprazole (PRILOSEC) 40 MG capsule TAKE ONE (1) CAPSULE EACH DAY 90 capsule 1  . triamterene-hydrochlorothiazide (MAXZIDE) 75-50 MG per tablet TAKE 1/2 TO ONE TABLET ONCE DAILY  FOR BLOOD PRESSURE AND FLUID (Patient taking differently: TAKE 1/2 TO ONE TABLET 2-3 TIMES WEEKLY.) 30 tablet 1   No current facility-administered medications on file prior to visit.    Past Medical History:  Diagnosis Date  . DJD (degenerative joint disease)   . HLD (hyperlipidemia)   . HTN (hypertension)   . Mild mitral regurgitation by prior echocardiogram   . Mild tricuspid regurgitation   . Palpitations     Review of Systems  Constitutional: Positive for activity change, appetite change, chills and fatigue. Negative for diaphoresis, fever and unexpected weight change.  HENT: Negative.   Respiratory: Positive for cough. Negative for apnea, choking, chest tightness, shortness of breath, wheezing and stridor.   Cardiovascular: Positive for chest pain (right shoulder blade/center back) and palpitations. Negative for leg swelling.  Gastrointestinal: Positive for nausea. Negative for abdominal pain, constipation, diarrhea and vomiting.  Genitourinary: Negative.   Musculoskeletal: Positive for arthralgias and myalgias.  Neurological: Negative.   Psychiatric/Behavioral: Negative.        Objective:   Physical Exam  Constitutional: She is oriented to person, place, and time. She appears well-developed. She appears cachectic. She has a sickly appearance.  HENT:  Head: Normocephalic and atraumatic.  Eyes: Conjunctivae are normal. Pupils are equal, round, and reactive to light.  Neck: Normal range of motion. Neck supple.  Cardiovascular:  Extrasystoles  are present. Tachycardia present.   Murmur heard.  Systolic murmur is present  Pulmonary/Chest: No accessory muscle usage. No respiratory distress. She has decreased breath sounds in the right lower field.  Abdominal: Soft. Bowel sounds are normal. She exhibits no distension. There is no tenderness.  Neurological: She is alert and oriented to person, place, and time.  Skin: No rash noted.       Assessment & Plan:  82 year old  female with likely COPD exacerbation presents with hypoxia and weakness- likely dehydration as well contributing to weakness, with 1 episode of chest pain want to rule out PE/MI Did have tick bites as well, may need to rule out RMSF/tick borne illness.  Discussed with patient and daughter will go to the ER via private vehicle. I informed them that do to her hypoxia I prefer if she went by ambulance but patient declines, under stands risk of MI,death but daughter and patient agree to going by private vehicle.

## 2016-04-19 NOTE — H&P (Signed)
TRH H&P   Patient Demographics:    Andrea [name], is a 82 y.o. female  MRN: [id number]   DOB - Mar 28, 1929  Admit Date - 04/19/2016  Outpatient Primary MD for the patient is Alesia Richards, MD  Referring MD/NP/PA: Long  Outpatient Specialists: [facility]    Patient coming from: home  Chief Complaint  Patient presents with  . Shortness of Breath      HPI:    Andrea [name]  is a 82 y.o. female, w Copd, Bronchiectasis, Aortic/Mitral regurgitation, Hypertension who presented to Dr. Idell Pickles office for evaluation due to dyspnea 2 days. Slight nausea.  + cough (dry)  Denies fever, cp, palp, emesis, diarrhea. Pt was sent to ED for evaluation.    In ED at [facility] CXR showed multiple patchy infilatrates along with bilateral effusions. Pt will be admitted for w/up of pneumonia (CAP).      Review of systems:    In addition to the HPI above,  No Fever, No Headache, No changes with Vision or hearing, No problems swallowing food or Liquids, No Chest pain, Cough  No Abdominal pain, No Vommitting, Bowel movements are regular, No Blood in stool or Urine, No dysuria, No new skin rashes or bruises, No new joints pains-aches,  No new weakness, tingling, numbness in any extremity, No recent weight gain or loss, No polyuria, polydypsia or polyphagia, No significant Mental Stressors.  A full 10 point Review of Systems was done, except as stated above, all other Review of Systems were negative.   With Past History of the following :    Past Medical History:  Diagnosis Date  . Aortic regurgitation   . Bronchiectasis ([place])   . COPD (chronic obstructive pulmonary disease) ([place])   . DJD (degenerative joint disease)   . HLD (hyperlipidemia)   . HTN (hypertension)   . Mild mitral regurgitation by prior echocardiogram   . Mild tricuspid regurgitation   . Palpitations   .  Thyroid disease       Past Surgical History:  Procedure Laterality Date  . ABDOMINAL HYSTERECTOMY  1993  . CATARACT EXTRACTION, BILATERAL        Social History:     Social History  Substance Use Topics  . Smoking status: Former Smoker    Packs/day: 1.00    Years: 20.00    Types: Cigarettes    Quit date: 09/27/1974  . Smokeless tobacco: Never Used  . Alcohol use No     Lives - at home  Mobility - ambulates by self  Family History :     Family History  Problem Relation Age of Onset  . Emphysema Mother   . Diabetes Mother   . Cancer Father   . Emphysema Sister   . Emphysema Sister   . Rheumatic fever Sister   . Heart attack Sister   . Diabetes Sister   . Diabetes Brother   .  Asthma Brother     as a child    Home Medications:   Prior to Admission medications   Medication Sig Start Date End Date Taking? Authorizing Provider  amLODipine (NORVASC) 10 MG tablet Take 1 tablet (10 mg total) by mouth daily. Patient taking differently: Take 5 mg by mouth daily.  12/09/15  Yes Lelon Perla, MD  Ascorbic Acid (VITAMIN C) 500 MG tablet Take 500 mg by mouth daily.     Yes Historical Provider, MD  atenolol (TENORMIN) 100 MG tablet TAKE ONE TABLET ONCE DAILY Patient taking differently: TAKE ONE-HALF TABLET BY MOUTH TWICE DAILY 02/09/16  Yes Unk Pinto, MD  cholecalciferol (VITAMIN D) 1000 UNITS tablet Take 1,000 Units by mouth daily.    Yes Historical Provider, MD  furosemide (LASIX) 40 MG tablet Take 1 tablet by mouth daily with breakfast for leg swelling. Patient taking differently: Take 40 mg by mouth daily as needed for fluid. Take 1 tablet by mouth daily with breakfast for leg swelling. 01/08/16 01/07/17 Yes Courtney Forcucci, PA-C  levothyroxine (SYNTHROID, LEVOTHROID) 50 MCG tablet Take 1 tablet daily or as directed Patient taking differently: ONE-HALF TABLET Friday AND SUNDAY 11/07/15 05/06/16 Yes Unk Pinto, MD  losartan (COZAAR) 100 MG tablet TAKE ONE-HALF  TABLET IN THE MORNING AND ONE-HALF TABLET AT NIGHT 02/09/16  Yes Unk Pinto, MD  Magnesium 500 MG TABS Take 0.5 tablets by mouth daily.    Yes Historical Provider, MD  omeprazole (PRILOSEC) 40 MG capsule TAKE ONE (1) CAPSULE EACH DAY 12/11/15  Yes Unk Pinto, MD  triamterene-hydrochlorothiazide (MAXZIDE) 75-50 MG per tablet TAKE 1/2 TO ONE TABLET ONCE DAILY FOR BLOOD PRESSURE AND FLUID Patient taking differently: TAKE 1/2 TO ONE TABLET 2-3 TIMES WEEKLY. 06/03/15  Yes Vicie Mutters, PA-C     Allergies:     Allergies  Allergen Reactions  . Sulfa Antibiotics Palpitations    Unknown     Physical Exam:   Vitals  Blood pressure (!) 112/47, pulse 72, temperature 98.2 F (36.8 C), resp. rate 20, height _0  (1.6 m), weight 44.5 kg (98 lb), SpO2 (!) 85 %.   1. General  lying in bed in NAD,    2. Normal affect and insight, Not Suicidal or Homicidal, Awake Alert, Oriented X 3.  3. No F.N deficits, ALL C.Nerves Intact, Strength 5/5 all 4 extremities, Sensation intact all 4 extremities, Plantars down going.  4. Ears and Eyes appear Normal, Conjunctivae clear, PERRLA. Moist Oral Mucosa.  5. Supple Neck, No JVD, No cervical lymphadenopathy appriciated, No Carotid Bruits.  6. Symmetrical Chest wall movement, Good air movement bilaterally, slight decrease bs at bilateral lung base along with few crackles at lung base.    7. RRR, No Gallops, Rubs or Murmurs, No Parasternal Heave.  8. Positive Bowel Sounds, Abdomen Soft, No tenderness, No organomegaly appriciated,No rebound -guarding or rigidity.  9.  No Cyanosis, Normal Skin Turgor, No Skin Rash or Bruise.  10. Good muscle tone,  joints appear normal , no effusions, Normal ROM.  11. No Palpable Lymph Nodes in Neck or Axillae     Data Review:    CBC  Recent Labs Lab 04/19/16 1607  WBC 20.8*  HGB 12.9  HCT 39.5  PLT 225  MCV 93.2  MCH 30.4  MCHC 32.7  RDW 13.0  LYMPHSABS 0.9  MONOABS 1.1*  EOSABS 0.0  BASOSABS  0.0   ------------------------------------------------------------------------------------------------------------------  Chemistries   Recent Labs Lab 04/19/16 1607  NA 131*  K 4.1  CL  92*  CO2 26  GLUCOSE 85  BUN 26*  CREATININE 1.07*  CALCIUM 9.2  AST 24  ALT 12*  ALKPHOS 72  BILITOT 1.2   ------------------------------------------------------------------------------------------------------------------ estimated creatinine clearance is 27 mL/min (by C-G formula based on SCr of 1.07 mg/dL). ------------------------------------------------------------------------------------------------------------------ No results for input(s): TSH, T4TOTAL, T3FREE, THYROIDAB in the last 72 hours.  Invalid input(s): FREET3  Coagulation profile No results for input(s): INR, PROTIME in the last 168 hours. ------------------------------------------------------------------------------------------------------------------- No results for input(s): DDIMER in the last 72 hours. -------------------------------------------------------------------------------------------------------------------  Cardiac Enzymes No results for input(s): CKMB, TROPONINI, MYOGLOBIN in the last 168 hours.  Invalid input(s): CK ------------------------------------------------------------------------------------------------------------------    Component Value Date/Time   BNP 271.5 (H) 06/04/2014 1003     ---------------------------------------------------------------------------------------------------------------  Urinalysis    Component Value Date/Time   COLORURINE YELLOW 09/25/2015 1547   APPEARANCEUR CLEAR 09/25/2015 1547   LABSPEC 1.008 09/25/2015 1547   PHURINE 7.0 09/25/2015 1547   GLUCOSEU NEGATIVE 09/25/2015 1547   HGBUR NEGATIVE 09/25/2015 1547   BILIRUBINUR NEGATIVE 09/25/2015 1547   KETONESUR NEGATIVE 09/25/2015 1547   PROTEINUR NEGATIVE 09/25/2015 1547   UROBILINOGEN 0.2 08/20/2014 1613    NITRITE NEGATIVE 09/25/2015 1547   LEUKOCYTESUR 1+ (A) 09/25/2015 1547    ----------------------------------------------------------------------------------------------------------------   Imaging Results:    Dg Chest 2 View  Result Date: 04/19/2016 CLINICAL DATA:  Started feeling bad with sob since yesterday , her husband was dx with pneu yesterday, She is coming from dr office with sats of 84 % RA NOW SHE HAS SATS OF 78. PT PLACED ON nrb 100% AND EKG DONE , NO CP at this time having back and shoulder pain EXAM: CHEST  2 VIEW COMPARISON:  06/06/2014 FINDINGS: There are moderate bilateral pleural effusions, larger on the right. Lungs are hyperexpanded. There is patchy airspace as well as interstitial type opacities in the right middle lobe and right lower lobe increased when compared to the prior exam. This is consistent with the given history of pneumonia. There is no convincing pulmonary edema. Cardiac silhouette is normal in size and configuration. No mediastinal or hilar masses or convincing adenopathy. No pneumothorax. Bony thorax is demineralized but grossly intact. IMPRESSION: 1. Patchy airspace interstitial opacities at the right lung base consistent with pneumonia superimposed on COPD. 2. Moderate pleural effusions, right greater than left. No convincing pulmonary edema. Electronically Signed   By: Lajean Manes M.D.   On: 04/19/2016 17:22      Assessment & Plan:    Principal Problem:   CAP (community acquired pneumonia) Active Problems:   Hyponatremia   Renal insufficiency   Tachycardia    1. Dyspnea Likely secondary to CAP Blood culture x2 Check urine legionella antigen vanco rocephin, zithromax  2. Pleural effusions Check echo Cont lasix  3. Hyponatremia Check serum osm, cortisol, tsh, urine sodium, urine osm.  Hydrate with ns iv gently.  Check cmp in am  4. Copd spiriva 1puff qday Albuterol prn  5. AR/ MR F/u with [facility]s   6. Gerd Cont  omeprazole  7. Hypothyroidism Cont levothyroxine  8. Leukocytosis Secondary to #1 Check cbc in am  9. Tachycardia Resolved Check trop in am  DVT Prophylaxis lovenox  SCDs  AM Labs Ordered, also please review Full Orders  Family Communication: Admission, patients condition and plan of care including tests being ordered have been discussed with the patient  who indicate understanding and agree with the plan and Code Status.  Code Status FULL CODE  Likely DC to  Condition GUARDED    Consults called:   Admission status:  Inpatient   Time spent in minutes : 45 minutes   Jani Gravel M.D on 04/19/2016 at 9:10 PM  Between 7am to 7pm - Pager - [phone]  After 7pm go to [email or web address] - password [facility]  [facility] - Office  [phone]

## 2016-04-19 NOTE — ED Provider Notes (Signed)
Emergency Department Provider Note  Time seen: Approximately 8:31 PM  I have reviewed the triage vital signs and the nursing notes.   HISTORY  Chief Complaint Shortness of Breath   HPI Ambriella GALA PADOVANO is a 82 y.o. female with past nuchal history of COPD on nighttime oxygen, HTN, HLD, and palpitations presents to the emergency department for evaluation of shortness of breath, dry cough, nausea, right shoulder pain for the past several days. The patient's husband has an upper respiratory tract infection as well. The patient uses home oxygen only at night but is felt worsening dyspnea during the day. She's been compliant with all medications. She denies productive cough or fever. No shaking chills. No associated abdominal pain, vomiting, diarrhea. She initially presented to [facility] in [place] where labs were drawn along with chest x-ray.   Past Medical History:  Diagnosis Date  . Aortic regurgitation   . Bronchiectasis ([place])   . COPD (chronic obstructive pulmonary disease) ([place])   . DJD (degenerative joint disease)   . HLD (hyperlipidemia)   . HTN (hypertension)   . Mild mitral regurgitation by prior echocardiogram   . Mild tricuspid regurgitation   . Palpitations   . Thyroid disease     Patient Active Problem List   Diagnosis Date Noted  . CAP (community acquired pneumonia) 04/19/2016  . Hyponatremia 04/19/2016  . Renal insufficiency 04/19/2016  . Tachycardia 04/19/2016  . Pneumonia 04/19/2016  . Skin cancer 09/25/2015  . Malnutrition ([place]) 09/25/2015  . Hypothyroidism 09/25/2015  . Osteoporosis 09/25/2015  . Palpitations 09/04/2015  . Aortic insufficiency 09/04/2015  . Vitamin D deficiency 01/23/2014  . Medication management 01/23/2014  . Hyperlipidemia 08/10/2013  . Prediabetes 08/10/2013  . DJD (degenerative joint disease)   . Mild mitral regurgitation by prior echocardiogram   . Essential hypertension 05/08/2010  . COPD with emphysema ([place]) 05/08/2010  .  Lung nodules 05/08/2010    Past Surgical History:  Procedure Laterality Date  . ABDOMINAL HYSTERECTOMY  1993  . CATARACT EXTRACTION, BILATERAL      Current Outpatient Rx  . Order #: [id number] Class: Normal  . Order #: [id number] Class: Historical Med  . Order #: [id number] Class: Normal  . Order #: [id number] Class: Historical Med  . Order #: [id number] Class: Normal  . Order #: [id number] Class: Normal  . Order #: [id number] Class: Normal  . Order #: [id number] Class: Historical Med  . Order #: [id number] Class: Normal  . Order #: [id number] Class: Normal    Allergies Sulfa antibiotics  Family History  Problem Relation Age of Onset  . Emphysema Mother   . Diabetes Mother   . Cancer Father   . Emphysema Sister   . Emphysema Sister   . Rheumatic fever Sister   . Heart attack Sister   . Diabetes Sister   . Diabetes Brother   . Asthma Brother     as a child    Social History Social History  Substance Use Topics  . Smoking status: Former Smoker    Packs/day: 1.00    Years: 20.00    Types: Cigarettes    Quit date: 09/27/1974  . Smokeless tobacco: Never Used  . Alcohol use No    Review of Systems  Constitutional: No fever/chills Eyes: No visual changes. ENT: No sore throat. Cardiovascular: Denies chest pain. Positive right shoulder pain.  Respiratory: Positive shortness of breath and cough.  Gastrointestinal: No abdominal pain.  No nausea, no vomiting.  No diarrhea.  No constipation. Genitourinary: Negative for dysuria. Musculoskeletal: Negative for back pain. Skin:  Negative for rash. Neurological: Negative for headaches, focal weakness or numbness.  10-point ROS otherwise negative.  ____________________________________________   PHYSICAL EXAM:  VITAL SIGNS: ED Triage Vitals [04/19/16 2006]  Enc Vitals Group     BP (!) 112/47     Pulse Rate 72     Resp 20     Temp 98.2 F (36.8 C)     SpO2 (!) 85 %     Weight 98 lb (44.5 kg)     Height _0  (1.6 m)    Constitutional: Alert and oriented. Well appearing and in no acute distress. Eyes: Conjunctivae are normal. PERRL. EOMI. Head: Atraumatic. Nose: No congestion/rhinnorhea. Mouth/Throat: Mucous membranes are moist.  Oropharynx non-erythematous. Neck: No stridor.   Cardiovascular: Normal rate, regular rhythm. Good peripheral circulation. Grossly normal heart sounds.   Respiratory: Normal respiratory effort.  No retractions. Lungs diminished at the bases bilaterally with the right worse than left. No appreciable wheezing.  Gastrointestinal: Soft and nontender. No distention.  Musculoskeletal: No lower extremity tenderness nor edema. No gross deformities of extremities. Neurologic:  Normal speech and language. No gross focal neurologic deficits are appreciated.  Skin:  Skin is warm, dry and intact. No rash noted. Psychiatric: Mood and affect are normal. Speech and behavior are normal.  ____________________________________________   LABS (all labs ordered are listed, but only abnormal results are displayed)  Labs Reviewed  TROPONIN I   ____________________________________________  EKG  Reviewed. Similar to prior.  ____________________________________________  RADIOLOGY  Dg Chest 2 View  Result Date: 04/19/2016 CLINICAL DATA:  Started feeling bad with sob since yesterday , her husband was dx with pneu yesterday, She is coming from dr office with sats of 84 % RA NOW SHE HAS SATS OF 78. PT PLACED ON nrb 100% AND EKG DONE , NO CP at this time having back and shoulder pain EXAM: CHEST  2 VIEW COMPARISON:  06/06/2014 FINDINGS: There are moderate bilateral pleural effusions, larger on the right. Lungs are hyperexpanded. There is patchy airspace as well as interstitial type opacities in the right middle lobe and right lower lobe increased when compared to the prior exam. This is consistent with the given history of pneumonia. There is no convincing pulmonary edema. Cardiac silhouette is normal  in size and configuration. No mediastinal or hilar masses or convincing adenopathy. No pneumothorax. Bony thorax is demineralized but grossly intact. IMPRESSION: 1. Patchy airspace interstitial opacities at the right lung base consistent with pneumonia superimposed on COPD. 2. Moderate pleural effusions, right greater than left. No convincing pulmonary edema. Electronically Signed   By: Lajean Manes M.D.   On: 04/19/2016 17:22   ____________________________________________   PROCEDURES  Procedure(s) performed:   Procedures  None ____________________________________________   INITIAL IMPRESSION / ASSESSMENT AND PLAN / ED COURSE  Pertinent labs & imaging results that were available during my care of the patient were reviewed by me and considered in my medical decision making (see chart for details).  Patient presents to the emergency department for evaluation of worsening dyspnea, increased oxygen requirement, and right shoulder discomfort in the setting of right lower lobe pneumonia. An labs from [place], the patient has a white blood cell count of 20. No recent hospitalization. She is on increased O2 (3L). She is overall well-appearing and afebrile. Given her profound hypoxemia [place] new oxygen requirement I plan on discussing the case with the hospitalist.   Discussed patient's case with hospitalist, Dr. Maudie Mercury.  Recommend admission to obs, tele bed.  I will  place holding orders per their request. Patient and family (if present) updated with plan. Care transferred to hospitalist service.  I reviewed all nursing notes, vitals, pertinent old records, EKGs, labs, imaging (as available). ____________________________________________  FINAL CLINICAL IMPRESSION(S) / ED DIAGNOSES  Final diagnoses:  Dyspnea  CAP (community acquired pneumonia)     MEDICATIONS GIVEN DURING THIS VISIT:  Medications  azithromycin (ZITHROMAX) 500 mg in dextrose 5 % 250 mL IVPB (500 mg Intravenous New  Bag/Given 04/19/16 2103)  cefTRIAXone (ROCEPHIN) 1 g in dextrose 5 % 50 mL IVPB (0 g Intravenous Stopped 04/19/16 2132)     NEW OUTPATIENT MEDICATIONS STARTED DURING THIS VISIT:  None   Note:  This document was prepared using Dragon voice recognition software and may include unintentional dictation errors.  Nanda Quinton, MD Emergency Medicine   Margette Fast, MD 04/19/16 2158

## 2016-04-20 DIAGNOSIS — J9621 Acute and chronic respiratory failure with hypoxia: Secondary | ICD-10-CM | POA: Diagnosis present

## 2016-04-20 DIAGNOSIS — J90 Pleural effusion, not elsewhere classified: Secondary | ICD-10-CM | POA: Diagnosis present

## 2016-04-20 DIAGNOSIS — J438 Other emphysema: Secondary | ICD-10-CM

## 2016-04-20 DIAGNOSIS — E43 Unspecified severe protein-calorie malnutrition: Secondary | ICD-10-CM | POA: Diagnosis present

## 2016-04-20 DIAGNOSIS — J441 Chronic obstructive pulmonary disease with (acute) exacerbation: Secondary | ICD-10-CM | POA: Diagnosis present

## 2016-04-20 LAB — CBC WITH DIFFERENTIAL/PLATELET
BASOS ABS: 0 10*3/uL (ref 0.0–0.1)
BASOS PCT: 0 %
EOS ABS: 0 10*3/uL (ref 0.0–0.7)
Eosinophils Relative: 0 %
HEMATOCRIT: 34.8 % — AB (ref 36.0–46.0)
HEMOGLOBIN: 11.2 g/dL — AB (ref 12.0–15.0)
Lymphocytes Relative: 8 %
Lymphs Abs: 1 10*3/uL (ref 0.7–4.0)
MCH: 30.3 pg (ref 26.0–34.0)
MCHC: 32.2 g/dL (ref 30.0–36.0)
MCV: 94.1 fL (ref 78.0–100.0)
Monocytes Absolute: 0.6 10*3/uL (ref 0.1–1.0)
Monocytes Relative: 5 %
NEUTROS ABS: 11.1 10*3/uL — AB (ref 1.7–7.7)
NEUTROS PCT: 87 %
Platelets: 191 10*3/uL (ref 150–400)
RBC: 3.7 MIL/uL — AB (ref 3.87–5.11)
RDW: 13 % (ref 11.5–15.5)
WBC: 12.8 10*3/uL — AB (ref 4.0–10.5)

## 2016-04-20 LAB — COMPREHENSIVE METABOLIC PANEL
ALBUMIN: 2.7 g/dL — AB (ref 3.5–5.0)
ALK PHOS: 52 U/L (ref 38–126)
ALT: 10 U/L — ABNORMAL LOW (ref 14–54)
AST: 16 U/L (ref 15–41)
Anion gap: 7 (ref 5–15)
BILIRUBIN TOTAL: 0.7 mg/dL (ref 0.3–1.2)
BUN: 38 mg/dL — AB (ref 6–20)
CALCIUM: 8.1 mg/dL — AB (ref 8.9–10.3)
CO2: 31 mmol/L (ref 22–32)
Chloride: 92 mmol/L — ABNORMAL LOW (ref 101–111)
Creatinine, Ser: 1.13 mg/dL — ABNORMAL HIGH (ref 0.44–1.00)
GFR calc Af Amer: 50 mL/min — ABNORMAL LOW (ref >60)
GFR calc non Af Amer: 43 mL/min — ABNORMAL LOW (ref >60)
GLUCOSE: 85 mg/dL (ref 65–99)
Potassium: 3.7 mmol/L (ref 3.5–5.1)
SODIUM: 130 mmol/L — AB (ref 135–145)
TOTAL PROTEIN: 5.9 g/dL — AB (ref 6.5–8.1)

## 2016-04-20 LAB — OSMOLALITY: Osmolality: 283 mOsm/kg (ref 275–295)

## 2016-04-20 LAB — TSH: TSH: 0.672 u[IU]/mL (ref 0.350–4.500)

## 2016-04-20 MED ORDER — VANCOMYCIN HCL IN DEXTROSE 750-5 MG/150ML-% IV SOLN
750.0000 mg | Freq: Once | INTRAVENOUS | Status: AC
  Administered 2016-04-20: 750 mg via INTRAVENOUS
  Filled 2016-04-20: qty 150

## 2016-04-20 MED ORDER — IPRATROPIUM-ALBUTEROL 0.5-2.5 (3) MG/3ML IN SOLN: 3.0000 mL | Freq: Four times a day (QID) | RESPIRATORY_TRACT | Status: DC

## 2016-04-20 MED ORDER — ATENOLOL 25 MG PO TABS
25.0000 mg | ORAL_TABLET | Freq: Two times a day (BID) | ORAL | Status: DC
  Administered 2016-04-21 – 2016-04-25 (×9): 25 mg via ORAL
  Filled 2016-04-20 (×9): qty 1

## 2016-04-20 MED ORDER — METHYLPREDNISOLONE SODIUM SUCC 40 MG IJ SOLR
40.0000 mg | Freq: Two times a day (BID) | INTRAMUSCULAR | Status: DC
  Administered 2016-04-20 – 2016-04-22 (×4): 40 mg via INTRAVENOUS
  Filled 2016-04-20 (×4): qty 1

## 2016-04-20 MED ORDER — FUROSEMIDE 20 MG PO TABS
20.0000 mg | ORAL_TABLET | Freq: Two times a day (BID) | ORAL | Status: DC
  Administered 2016-04-20 – 2016-04-21 (×2): 20 mg via ORAL
  Filled 2016-04-20 (×2): qty 1

## 2016-04-20 MED ORDER — BENZONATATE 100 MG PO CAPS: 100.0000 mg | ORAL_CAPSULE | Freq: Three times a day (TID) | ORAL | Status: DC | PRN

## 2016-04-20 MED ORDER — VANCOMYCIN HCL 500 MG IV SOLR
500.0000 mg | INTRAVENOUS | Status: DC
  Administered 2016-04-21: 500 mg via INTRAVENOUS
  Filled 2016-04-20 (×2): qty 500

## 2016-04-20 MED ORDER — IPRATROPIUM-ALBUTEROL 0.5-2.5 (3) MG/3ML IN SOLN
3.0000 mL | Freq: Four times a day (QID) | RESPIRATORY_TRACT | Status: DC
  Administered 2016-04-20 – 2016-04-21 (×4): 3 mL via RESPIRATORY_TRACT
  Filled 2016-04-20 (×4): qty 3

## 2016-04-20 MED ORDER — LOSARTAN POTASSIUM 50 MG PO TABS
25.0000 mg | ORAL_TABLET | Freq: Every day | ORAL | Status: DC
  Administered 2016-04-21: 25 mg via ORAL
  Filled 2016-04-20: qty 1

## 2016-04-20 NOTE — Progress Notes (Signed)
Patient BP 96/35 Dr. Caryn Section notified.

## 2016-04-20 NOTE — Progress Notes (Signed)
PROGRESS NOTE    Andrea [name]  JJK:[id number] DOB: 13-Sep-1929 DOA: 04/19/2016 PCP: Alesia Richards, MD    Brief Narrative:  Patient is an 82 year old woman with a history of COPD, on nighttime oxygen, bronchiectasis HTN, degenerative joint disease, and hypothyroidism, who was admitted on 04/19/2016 for a complaint of shortness of breath. In the ED, she was hypoxic on room air with an oxygen saturation of 77%. Her blood pressure was on the lower end of normal. Her lab data were significant for a WBC of 20.8, sodium 131, and BUN of 26. Her chest x-ray revealed patchy airspace opacities at the right lung base superimposed on COPD, and right greater than left moderate pleural effusions. She was admitted for further evaluation and management.   Assessment & Plan:   Principal Problem:   CAP (community acquired pneumonia) Active Problems:   COPD with emphysema ([place])   Bilateral pleural effusion   COPD with exacerbation ([place])   Acute on chronic respiratory failure with hypoxia ([place])   Essential hypertension   Hypothyroidism   Hyponatremia   Tachycardia   Protein-calorie malnutrition, severe   1. Community-acquired pneumonia. The patient was started on Rocephin, vancomycin, and azithromycin. Strep pneumo antigen and Legionella antigen are pending. -She is afebrile. Her white blood cell count has trended down. -We'll discontinue vancomycin.  COPD with exacerbation. Will start the patient on duo nebulizers and Solu-Medrol.  Acute on chronic respiratory failure secondary to CAP and COPD with exacerbation. -Secondary to CAPD and COPD with exacerbation. The patient was restarted on oxygen, titrated to keep her oxygen saturations at 90% or above. We'll continue to monitor as she improves.  Bilateral pleural effusions. Patient denies history of CHF, but she takes Lasix as needed for swelling. The pleural effusions may be parapneumonic. -We'll order 2-D echocardiogram for further  evaluation. -We'll change Lasix to 20 mg twice a day.  Essential hypertension. The patient is treated with multiple antihypertensive medications, all restarted on admission. Her blood pressure is trending down. -We'll discontinue amlodipine and Maxzide. Will decrease the dose of Cozaar and atenolol. -Continue gentle IV fluids.  Hyponatremia. Her serum sodium was 131 on admission. It is unclear the etiology given that she is on Lasix, Maxzide, and was started on IV fluids on admission. -We'll order a few sodium studies. As above, Lasix dosing was changed and Maxzide was discontinued. She will be continued on gentle IV fluids.  Hypothyroidism. She was restarted on Synthroid. Her TSH was within normal limits.    DVT prophylaxis: Lovenox Code Status: Full code Family Communication: Family not available Disposition Plan: Discharge with clinically appropriate, likely in a few days.   Consultants:   None  Procedures:   Echo pending  Antimicrobials:  Azithromycin 04/19/16  Rocephin 04/19/16  Vancomycin 04/19/16-04/20/16   Subjective: Patient says that she feels no better. She is still somewhat short of breath. She denies chest pain.  Objective: Vitals:   04/19/16 2318 04/20/16 0630 04/20/16 0748 04/20/16 1340  BP: (!) 104/45 (!) 110/52  (!) 96/35  Pulse: 71 72  73  Resp: _0 Temp: 97.5 F (36.4 C) 97.9 F (36.6 C)  97.7 F (36.5 C)  TempSrc: Oral Oral  Oral  SpO2: 94% 93% 91% 90%  Weight: 45.1 kg (99 lb 6.4 oz)     Height: _1  (1.6 m)       Intake/Output Summary (Last 24 hours) at 04/20/16 1840 Last data filed at 04/20/16 1800  Gross per 24 hour  Intake              270 ml  Output                0 ml  Net              270 ml   Filed Weights   04/19/16 2006 04/19/16 2318  Weight: 44.5 kg (98 lb) 45.1 kg (99 lb 6.4 oz)    Examination:  General exam: Frail 82 year old Caucasian woman who appears to be chronically short of breath. Respiratory system:  Global decreased breath sounds with few scattered breakthrough crackles. Breathing mildly labored when speaking but no accessory muscle use. Cardiovascular system: S1 & S2 heard, RRR. No JVD, murmurs, rubs, gallops or clicks. No pedal edema. Gastrointestinal system: Abdomen is nondistended, soft and nontender. No organomegaly or masses felt. Normal bowel sounds heard. Central nervous system: Alert and oriented. No focal neurological deficits. Extremities: Symmetric 5 x 5 power. Skin: No rashes, lesions or ulcers Psychiatry: Judgement and insight appear normal. Mood & affect appropriate.     Data Reviewed: I have personally reviewed following labs and imaging studies  CBC:  Recent Labs Lab 04/19/16 1607 04/20/16 0526  WBC 20.8* 12.8*  NEUTROABS 18.8* 11.1*  HGB 12.9 11.2*  HCT 39.5 34.8*  MCV 93.2 94.1  PLT 225 960   Basic Metabolic Panel:  Recent Labs Lab 04/19/16 1607 04/20/16 0526  NA 131* 130*  K 4.1 3.7  CL 92* 92*  CO2 26 31  GLUCOSE 85 85  BUN 26* 38*  CREATININE 1.07* 1.13*  CALCIUM 9.2 8.1*   GFR: Estimated Creatinine Clearance: 25.9 mL/min (by C-G formula based on SCr of 1.13 mg/dL). Liver Function Tests:  Recent Labs Lab 04/19/16 1607 04/20/16 0526  AST 24 16  ALT 12* 10*  ALKPHOS 72 52  BILITOT 1.2 0.7  PROT 7.7 5.9*  ALBUMIN 3.5 2.7*   No results for input(s): LIPASE, AMYLASE in the last 168 hours. No results for input(s): AMMONIA in the last 168 hours. Coagulation Profile: No results for input(s): INR, PROTIME in the last 168 hours. Cardiac Enzymes:  Recent Labs Lab 04/19/16 2032  TROPONINI <0.03   BNP (last 3 results) No results for input(s): PROBNP in the last 8760 hours. HbA1C: No results for input(s): HGBA1C in the last 72 hours. CBG: No results for input(s): GLUCAP in the last 168 hours. Lipid Profile: No results for input(s): CHOL, HDL, LDLCALC, TRIG, CHOLHDL, LDLDIRECT in the last 72 hours. Thyroid Function Tests:  Recent  Labs  04/19/16 2335  TSH 0.672   Anemia Panel: No results for input(s): VITAMINB12, FOLATE, FERRITIN, TIBC, IRON, RETICCTPCT in the last 72 hours. Sepsis Labs:  Recent Labs Lab 04/19/16 1626  LATICACIDVEN 1.72    Recent Results (from the past 240 hour(s))  Culture, blood (routine x 2) Call MD if unable to obtain prior to antibiotics being given     Status: None (Preliminary result)   Collection Time: 04/19/16 11:35 PM  Result Value Ref Range Status   Specimen Description BLOOD RIGHT ANTECUBITAL  Final   Special Requests   Final    BOTTLES DRAWN AEROBIC AND ANAEROBIC AEB 12CC ANA [place]   Culture NO GROWTH < 24 HOURS  Final   Report Status PENDING  Incomplete  Culture, blood (routine x 2) Call MD if unable to obtain prior to antibiotics being given     Status: None (Preliminary result)   Collection Time: 04/19/16 11:44 PM  Result Value  Ref Range Status   Specimen Description BLOOD LEFT ANTECUBITAL  Final   Special Requests   Final    BOTTLES DRAWN AEROBIC AND ANAEROBIC AEB 12CC ANA [place]   Culture NO GROWTH < 24 HOURS  Final   Report Status PENDING  Incomplete         Radiology Studies: Dg Chest 2 View  Result Date: 04/19/2016 CLINICAL DATA:  Started feeling bad with sob since yesterday , her husband was dx with pneu yesterday, She is coming from dr office with sats of 84 % RA NOW SHE HAS SATS OF 78. PT PLACED ON nrb 100% AND EKG DONE , NO CP at this time having back and shoulder pain EXAM: CHEST  2 VIEW COMPARISON:  06/06/2014 FINDINGS: There are moderate bilateral pleural effusions, larger on the right. Lungs are hyperexpanded. There is patchy airspace as well as interstitial type opacities in the right middle lobe and right lower lobe increased when compared to the prior exam. This is consistent with the given history of pneumonia. There is no convincing pulmonary edema. Cardiac silhouette is normal in size and configuration. No mediastinal or hilar masses or convincing  adenopathy. No pneumothorax. Bony thorax is demineralized but grossly intact. IMPRESSION: 1. Patchy airspace interstitial opacities at the right lung base consistent with pneumonia superimposed on COPD. 2. Moderate pleural effusions, right greater than left. No convincing pulmonary edema. Electronically Signed   By: Lajean Manes M.D.   On: 04/19/2016 17:22       Scheduled Meds: . [START ON 04/21/2016] atenolol  25 mg Oral BID  . azithromycin  500 mg Intravenous Q24H  . cefTRIAXone (ROCEPHIN)  IV  1 g Intravenous Q24H  . cholecalciferol  1,000 Units Oral Daily  . enoxaparin (LOVENOX) injection  30 mg Subcutaneous Q24H  . feeding supplement (ENSURE ENLIVE)  237 mL Oral BID BM  . furosemide  20 mg Oral BID  . levothyroxine  50 mcg Oral QAC breakfast  . losartan  100 mg Oral Daily  . magnesium oxide  400 mg Oral Daily  . pantoprazole  40 mg Oral Daily  . [START ON 04/21/2016] vancomycin  500 mg Intravenous Q24H  . vitamin C  500 mg Oral Daily   Continuous Infusions: . sodium chloride 70 mL/hr at 04/20/16 1257     LOS: 1 day    Time spent: 68 minutes    Rexene Alberts, MD [facility] Pager [phone]  If 7PM-7AM, please contact night-coverage [email or web address] Password [facility] 04/20/2016, 6:40 PM

## 2016-04-20 NOTE — Consult Note (Signed)
Spoke with patient and her daughter at bedside regarding [facility] services.  Patient does not want to participate with [facility] at this time. Patient given [facility] brochure and contact information for future reference. Patient daughter did have questions about home oxygen, referred to DME provider's hospital liaison for questions. Of note, [facility] Care Management services would not replace or interfere with any services that are arranged by inpatient case management or social work. For additional questions or referrals please contact:  Royetta Crochet. Laymond Purser, RN, BSN, [facility] Liaison [phone]

## 2016-04-20 NOTE — Progress Notes (Signed)
ANTIBIOTIC CONSULT NOTE-Preliminary  Pharmacy Consult for Vancomycin Indication: Pneumonia  Allergies  Allergen Reactions  . Sulfa Antibiotics Palpitations    Unknown    Patient Measurements: Height: _0  (160 cm) Weight: 99 lb 6.4 oz (45.1 kg) IBW/kg (Calculated) : 52.4  Vital Signs: Temp: 97.5 F (36.4 C) (07/24 2318) Temp Source: Oral (07/24 2318) BP: 104/45 (07/24 2318) Pulse Rate: 71 (07/24 2318)  Labs:  Recent Labs  04/19/16 1607  WBC 20.8*  HGB 12.9  PLT 225  CREATININE 1.07*    Estimated Creatinine Clearance: 27.4 mL/min (by C-G formula based on SCr of 1.07 mg/dL).  No results for input(s): VANCOTROUGH, VANCOPEAK, VANCORANDOM, GENTTROUGH, GENTPEAK, GENTRANDOM, TOBRATROUGH, TOBRAPEAK, TOBRARND, AMIKACINPEAK, AMIKACINTROU, AMIKACIN in the last 72 hours.   Microbiology: No results found for this or any previous visit (from the past 720 hour(s)).  Medical History: Past Medical History:  Diagnosis Date  . Aortic regurgitation   . Bronchiectasis ([place])   . COPD (chronic obstructive pulmonary disease) ([place])   . DJD (degenerative joint disease)   . HLD (hyperlipidemia)   . HTN (hypertension)   . Mild mitral regurgitation by prior echocardiogram   . Mild tricuspid regurgitation   . Palpitations   . Thyroid disease     Medications:  Azithromycin 500 mg IV x 1 dose given in the ED Ceftriaxone 1 Gm IV given in the ED  Assessment: 82 yo female with COPD exacerbation and superimposed pneumonia to be admitted and started on IV antibiotics empirically.  Goal of Therapy:  Vancomycin troughs 15-20 mcg/ml  Plan:  Preliminary review of pertinent patient information completed.  Protocol will be initiated with a one-time dose of Vancomycin 750 mg IV.  Forestine Na clinical pharmacist will complete review during morning rounds to assess patient and finalize treatment regimen.  Norberto Sorenson, [facility] 04/20/2016,12:03 AM

## 2016-04-20 NOTE — Care Management Note (Signed)
Case Management Note  Patient Details  Name: Andrea [name] MRN: [id number] Date of Birth: 07/01/1929  Subjective/Objective:                  Admitted with CAP. Pt is from home, lives with her husband and is ind with ADL's. Pt ambulates independently but has a cane and walker to use if needed. Pt drives some. Daughter at bedside. Pt has home Oxygen that she wears at night. She has port tanks but has never used them. Pt has oxygen supplied by [facility] Pt plans to return home with self care at DC.   Action/Plan: Will cont to follow.   Expected Discharge Date:    04/21/2016              Expected Discharge Plan:  Home/Self Care  In-House Referral:  NA  Discharge planning Services  CM Consult  Post Acute Care Choice:    Choice offered to:     DME Arranged:    DME Agency:     HH Arranged:    HH Agency:     Status of Service:  In process, will continue to follow  If discussed at Long Length of Stay Meetings, dates discussed:    Additional Comments:  Sherald Barge, RN 04/20/2016, 1:55 PM

## 2016-04-20 NOTE — Progress Notes (Signed)
[place] for Vancomycin Indication: Pneumonia  Allergies  Allergen Reactions  . Sulfa Antibiotics Palpitations    Unknown   Patient Measurements: Height: _0  (160 cm) Weight: 99 lb 6.4 oz (45.1 kg) IBW/kg (Calculated) : 52.4  Vital Signs: Temp: 97.9 F (36.6 C) (07/25 0630) Temp Source: Oral (07/25 0630) BP: 110/52 (07/25 0630) Pulse Rate: 72 (07/25 0630)  Labs:  Recent Labs  04/19/16 1607 04/20/16 0526  WBC 20.8* 12.8*  HGB 12.9 11.2*  PLT 225 191  CREATININE 1.07* 1.13*   Estimated Creatinine Clearance: 25.9 mL/min (by C-G formula based on SCr of 1.13 mg/dL).  No results for input(s): VANCOTROUGH, VANCOPEAK, VANCORANDOM, GENTTROUGH, GENTPEAK, GENTRANDOM, TOBRATROUGH, TOBRAPEAK, TOBRARND, AMIKACINPEAK, AMIKACINTROU, AMIKACIN in the last 72 hours.   Microbiology: Recent Results (from the past 720 hour(s))  Culture, blood (routine x 2) Call MD if unable to obtain prior to antibiotics being given     Status: None (Preliminary result)   Collection Time: 04/19/16 11:35 PM  Result Value Ref Range Status   Specimen Description BLOOD RIGHT ANTECUBITAL  Final   Special Requests   Final    BOTTLES DRAWN AEROBIC AND ANAEROBIC AEB 12CC ANA [place]   Culture PENDING  Incomplete   Report Status PENDING  Incomplete  Culture, blood (routine x 2) Call MD if unable to obtain prior to antibiotics being given     Status: None (Preliminary result)   Collection Time: 04/19/16 11:44 PM  Result Value Ref Range Status   Specimen Description BLOOD LEFT ANTECUBITAL  Final   Special Requests   Final    BOTTLES DRAWN AEROBIC AND ANAEROBIC AEB 12CC ANA [place]   Culture PENDING  Incomplete   Report Status PENDING  Incomplete   Medical History: Past Medical History:  Diagnosis Date  . Aortic regurgitation   . Bronchiectasis ([place])   . COPD (chronic obstructive pulmonary disease) ([place])   . DJD (degenerative joint disease)   . HLD (hyperlipidemia)   . HTN  (hypertension)   . Mild mitral regurgitation by prior echocardiogram   . Mild tricuspid regurgitation   . Palpitations   . Thyroid disease    Assessment: 82 yo female with COPD exacerbation and superimposed pneumonia to be admitted and started on IV antibiotics empirically.  Elderly, small body habitus.   Vancomycin 7/24 >> Rocephin 7/24 >> Zithromax 7/24 >>  Goal of Therapy:  Vancomycin troughs 15-20 mcg/ml  Plan: Vancomycin 555m IV q24hrs Check trough at steady state Continue Rocephin and Zithromax per MD orders Monitor labs, renal fxn, progress, and c/s  SHart Robinsons PharmD Clinical Pharmacist Pager:  [phone]/25/2017

## 2016-04-20 NOTE — Progress Notes (Signed)
Initial Nutrition Assessment  DOCUMENTATION CODES:   Severe malnutrition in context of chronic illness, Underweight  INTERVENTION:  Ensure Enlive po BID, each supplement provides 350 kcal and 20 grams of protein   Recommend consider liberalizing her diet if feasible   Provide hs snack   NUTRITION DIAGNOSIS:   Malnutrition related to chronic illness as evidenced by severe depletion of body fat, severe depletion of muscle mass.   GOAL:   Patient will meet greater than or equal to 90% of their needs, Weight gain   MONITOR:   PO intake, Supplement acceptance, Labs, Weight trends  REASON FOR ASSESSMENT:   Malnutrition Screening Tool    ASSESSMENT:  RONICA VIVIAN is a 82 y.o. female with past nuchal history of COPD on nighttime oxygen, HTN, HLD, and palpitations presents to the emergency department for evaluation of shortness of breath, dry cough, nausea, right shoulder pain for the past several days. The patient's husband has an upper respiratory tract infection as well.   Patient says she prepares the food for her and husband. She eats several small meals daily. She ate well at breakfast but lunch was only a couple of bites. Had been eating pretty good until this most respiratory illness. Patient is expecting to go back home tomorrow.   Nutrition-Focused physical exam completed. Findings are severe fat depletion, severe muscle depletion, and no edema.     Recent Labs Lab 04/19/16 1607 04/20/16 0526  NA 131* 130*  K 4.1 3.7  CL 92* 92*  CO2 26 31  BUN 26* 38*  CREATININE 1.07* 1.13*  CALCIUM 9.2 8.1*  GLUCOSE 85 85   Labs: hyponatremia   Diet Order:  Diet Heart Room service appropriate? Yes; Fluid consistency: Thin  Skin:  Reviewed, no issues  Last BM:  7/24  Height:   Ht Readings from Last 1 Encounters:  04/19/16 _0  (1.6 m)    Weight:   Wt Readings from Last 1 Encounters:  04/19/16 99 lb 6.4 oz (45.1 kg)    Ideal Body Weight:  52.2 kg  BMI:   Body mass index is 17.61 kg/m.  Estimated Nutritional Needs:   Kcal:  1575-1700  Protein:  68-75 gr  Fluid:  >1200 ml daily  EDUCATION NEEDS:   No education needs identified at this time  Colman Cater MS,RD,CSG,LDN Office: #[phone] Pager: [phone]

## 2016-04-21 ENCOUNTER — Other Ambulatory Visit: Payer: Self-pay | Admitting: Physician Assistant

## 2016-04-21 DIAGNOSIS — J189 Pneumonia, unspecified organism: Secondary | ICD-10-CM

## 2016-04-21 DIAGNOSIS — E43 Unspecified severe protein-calorie malnutrition: Secondary | ICD-10-CM

## 2016-04-21 DIAGNOSIS — I10 Essential (primary) hypertension: Secondary | ICD-10-CM

## 2016-04-21 DIAGNOSIS — E871 Hypo-osmolality and hyponatremia: Secondary | ICD-10-CM

## 2016-04-21 DIAGNOSIS — J9621 Acute and chronic respiratory failure with hypoxia: Secondary | ICD-10-CM

## 2016-04-21 DIAGNOSIS — J90 Pleural effusion, not elsewhere classified: Secondary | ICD-10-CM

## 2016-04-21 DIAGNOSIS — J441 Chronic obstructive pulmonary disease with (acute) exacerbation: Secondary | ICD-10-CM

## 2016-04-21 DIAGNOSIS — F411 Generalized anxiety disorder: Secondary | ICD-10-CM

## 2016-04-21 LAB — CBC
HEMATOCRIT: 34.6 % — AB (ref 36.0–46.0)
HEMOGLOBIN: 11.4 g/dL — AB (ref 12.0–15.0)
MCH: 30.9 pg (ref 26.0–34.0)
MCHC: 32.9 g/dL (ref 30.0–36.0)
MCV: 93.8 fL (ref 78.0–100.0)
Platelets: 227 10*3/uL (ref 150–400)
RBC: 3.69 MIL/uL — AB (ref 3.87–5.11)
RDW: 12.6 % (ref 11.5–15.5)
WBC: 12.3 10*3/uL — ABNORMAL HIGH (ref 4.0–10.5)

## 2016-04-21 LAB — NA AND K (SODIUM & POTASSIUM), RAND UR
Potassium Urine: 30 mmol/L
Sodium, Ur: 40 mmol/L

## 2016-04-21 LAB — STREP PNEUMONIAE URINARY ANTIGEN: STREP PNEUMO URINARY ANTIGEN: NEGATIVE

## 2016-04-21 LAB — BASIC METABOLIC PANEL
ANION GAP: 9 (ref 5–15)
BUN: 49 mg/dL — ABNORMAL HIGH (ref 6–20)
CALCIUM: 8.1 mg/dL — AB (ref 8.9–10.3)
CO2: 30 mmol/L (ref 22–32)
Chloride: 91 mmol/L — ABNORMAL LOW (ref 101–111)
Creatinine, Ser: 1.89 mg/dL — ABNORMAL HIGH (ref 0.44–1.00)
GFR, EST AFRICAN AMERICAN: 27 mL/min — AB (ref >60)
GFR, EST NON AFRICAN AMERICAN: 23 mL/min — AB (ref >60)
Glucose, Bld: 155 mg/dL — ABNORMAL HIGH (ref 65–99)
Potassium: 4.5 mmol/L (ref 3.5–5.1)
Sodium: 130 mmol/L — ABNORMAL LOW (ref 135–145)

## 2016-04-21 LAB — HIV ANTIBODY (ROUTINE TESTING W REFLEX): HIV SCREEN 4TH GENERATION: NONREACTIVE

## 2016-04-21 LAB — URIC ACID: URIC ACID, SERUM: 5.8 mg/dL (ref 2.3–6.6)

## 2016-04-21 LAB — BRAIN NATRIURETIC PEPTIDE: B Natriuretic Peptide: 422 pg/mL — ABNORMAL HIGH (ref 0.0–100.0)

## 2016-04-21 LAB — OSMOLALITY, URINE: OSMOLALITY UR: 310 mosm/kg (ref 300–900)

## 2016-04-21 MED ORDER — ALPRAZOLAM 0.5 MG PO TABS: ORAL_TABLET | ORAL | 1 refills | Status: AC

## 2016-04-21 MED ORDER — IPRATROPIUM-ALBUTEROL 0.5-2.5 (3) MG/3ML IN SOLN
3.0000 mL | Freq: Three times a day (TID) | RESPIRATORY_TRACT | Status: DC
  Administered 2016-04-22 – 2016-04-25 (×10): 3 mL via RESPIRATORY_TRACT
  Filled 2016-04-21 (×11): qty 3

## 2016-04-21 MED ORDER — SODIUM CHLORIDE 0.9 % IV SOLN
INTRAVENOUS | Status: AC
  Administered 2016-04-21: 18:00:00 via INTRAVENOUS

## 2016-04-21 MED ORDER — VANCOMYCIN HCL 500 MG IV SOLR: 500.0000 mg | INTRAVENOUS | Status: DC

## 2016-04-21 NOTE — Progress Notes (Signed)
PROGRESS NOTE    Andrea [name]  SNK:[id number] DOB: 02/07/1929 DOA: 04/19/2016 PCP: Alesia Richards, MD  Outpatient Specialists:  Cardiology: Kirk Ruths, MD Dermatology: Rolm Bookbinder. MD Gastroenterology: Erskine Emery, MD Pulmonary Disease: Baird Lyons, MD   Brief Narrative:  37 yof with hx of COPD for which she is on nighttime oxygen presented with complaints of SOB. In the ED she was noted to hypoxic on room air with saturation rate of 77%, hypotensive, and with labs notable for leukocytosis, and hyponatremia. Her CXR revealed patchy airspace opacities at the right lung base superimposed on COPD, and right greater than left moderate pleural effusions. She was admitted for further evaluation and management.   Assessment & Plan: Principal Problem:   CAP (community acquired pneumonia) Active Problems:   Essential hypertension   COPD with emphysema ([place])   Hypothyroidism   Hyponatremia   Tachycardia   Protein-calorie malnutrition, severe   Bilateral pleural effusion   COPD with exacerbation (HCC)   Acute on chronic respiratory failure with hypoxia ([place])   1. CAP, improving. Initially started on Rocephin, Vancomycin, and Azithromycin. She remains afebrile with improvement in leukocytosis. Strep pneumo antigen and Legionella antigen pending. BC in process. Will discontinue vanc due to rising Cr. 2. COPD exacerbation. Continue nebs, steroids, and abx. Slowly improving. 3. Acute on chronic respiratory failure secondary to CAP and COPD exacerbation. Continue to wean as tolerated.  4. Bilateral pleural effusion, patient denies hx of CHF yet is noted to be on outpatient lasix. Follow-up ECHO.  BNP 422.0 5. Essential HTN, stable.  6. Hyponatremia. Continue gentle hydration 7. AKI likely multifactorial in the setting of Vanc/lasix/cozaar. Will discontinue Vanc and hold lasix/cozaar. Continue hydration and recheck labs in the AM. 8. Protein-calorie malnutrition, severe.  Nutrition following.    DVT prophylaxis: Lovenox Code Status: Full Family Communication: Family at bedside Disposition Plan: Anticipate discharge in 24-48 hours.    Consultants:   Nutrition   Procedures:   None  Antimicrobials:   Rocephin 7/25>>    Vancomycin 7/26>>  Azithromycin 7/24>>  Subjective: Pt is doing better today. She reports coughing. She is not urinating frequently. Per family, pt wears 2L of oxygen at night. Also, Per family, pt has not been eating and drinking regularly.  Objective: Vitals:   04/20/16 1950 04/20/16 2140 04/21/16 0650 04/21/16 0802  BP:  108/60 110/71   Pulse:  68 70   Resp:  20 20   Temp:  97.9 F (36.6 C) 98 F (36.7 C)   TempSrc:  Oral Oral   SpO2: 97% 96% 95% 95%  Weight:      Height:        Intake/Output Summary (Last 24 hours) at 04/21/16 1101 Last data filed at 04/21/16 0200  Gross per 24 hour  Intake              450 ml  Output              100 ml  Net              350 ml   Filed Weights   04/19/16 2006 04/19/16 2318  Weight: 44.5 kg (98 lb) 45.1 kg (99 lb 6.4 oz)    Examination:  General exam: Appears calm and comfortable  Respiratory system: Diminished breath sounds at bases. Respiratory effort normal. Cardiovascular system: S1 & S2 heard, RRR. No JVD, murmurs, rubs, gallops or clicks. No pedal edema. Gastrointestinal system: Abdomen is nondistended, soft and nontender. No organomegaly or masses felt.  Normal bowel sounds heard. Central nervous system: Alert and oriented. No focal neurological deficits. Extremities: Symmetric 5 x 5 power. Skin: No rashes, lesions or ulcers Psychiatry: Judgement and insight appear normal. Mood & affect appropriate.     Data Reviewed:  CBC:  Recent Labs Lab 04/19/16 1607 04/20/16 0526 04/21/16 0551  WBC 20.8* 12.8* 12.3*  NEUTROABS 18.8* 11.1*  --   HGB 12.9 11.2* 11.4*  HCT 39.5 34.8* 34.6*  MCV 93.2 94.1 93.8  PLT 225 191 592   Basic Metabolic Panel:  Recent  Labs Lab 04/19/16 1607 04/20/16 0526 04/21/16 0551  NA 131* 130* 130*  K 4.1 3.7 4.5  CL 92* 92* 91*  CO2 _0 GLUCOSE 85 85 155*  BUN 26* 38* 49*  CREATININE 1.07* 1.13* 1.89*  CALCIUM 9.2 8.1* 8.1*   GFR: Estimated Creatinine Clearance: 15.5 mL/min (by C-G formula based on SCr of 1.89 mg/dL). Liver Function Tests:  Recent Labs Lab 04/19/16 1607 04/20/16 0526  AST 24 16  ALT 12* 10*  ALKPHOS 72 52  BILITOT 1.2 0.7  PROT 7.7 5.9*  ALBUMIN 3.5 2.7*   No results for input(s): LIPASE, AMYLASE in the last 168 hours. No results for input(s): AMMONIA in the last 168 hours. Coagulation Profile: No results for input(s): INR, PROTIME in the last 168 hours. Cardiac Enzymes:  Recent Labs Lab 04/19/16 2032  TROPONINI <0.03   BNP (last 3 results) No results for input(s): PROBNP in the last 8760 hours. HbA1C: No results for input(s): HGBA1C in the last 72 hours. CBG: No results for input(s): GLUCAP in the last 168 hours. Lipid Profile: No results for input(s): CHOL, HDL, LDLCALC, TRIG, CHOLHDL, LDLDIRECT in the last 72 hours. Thyroid Function Tests:  Recent Labs  04/19/16 2335  TSH 0.672   Anemia Panel: No results for input(s): VITAMINB12, FOLATE, FERRITIN, TIBC, IRON, RETICCTPCT in the last 72 hours. Urine analysis:    Component Value Date/Time   COLORURINE YELLOW 09/25/2015 1547   APPEARANCEUR CLEAR 09/25/2015 1547   LABSPEC 1.008 09/25/2015 1547   PHURINE 7.0 09/25/2015 1547   GLUCOSEU NEGATIVE 09/25/2015 1547   HGBUR NEGATIVE 09/25/2015 1547   BILIRUBINUR NEGATIVE 09/25/2015 1547   KETONESUR NEGATIVE 09/25/2015 1547   PROTEINUR NEGATIVE 09/25/2015 1547   UROBILINOGEN 0.2 08/20/2014 1613   NITRITE NEGATIVE 09/25/2015 1547   LEUKOCYTESUR 1+ (A) 09/25/2015 1547   Sepsis Labs: _1 (procalcitonin:4,lacticidven:4)  ) Recent Results (from the past 240 hour(s))  Culture, blood (routine x 2) Call MD if unable to obtain prior to antibiotics being  given     Status: None (Preliminary result)   Collection Time: 04/19/16 11:35 PM  Result Value Ref Range Status   Specimen Description BLOOD RIGHT ANTECUBITAL  Final   Special Requests   Final    BOTTLES DRAWN AEROBIC AND ANAEROBIC AEB=12CC ANA=6CC   Culture NO GROWTH 1 DAY  Final   Report Status PENDING  Incomplete  Culture, blood (routine x 2) Call MD if unable to obtain prior to antibiotics being given     Status: None (Preliminary result)   Collection Time: 04/19/16 11:44 PM  Result Value Ref Range Status   Specimen Description BLOOD LEFT ANTECUBITAL  Final   Special Requests   Final    BOTTLES DRAWN AEROBIC AND ANAEROBIC AEB=12CC ANA=8CC   Culture NO GROWTH 1 DAY  Final   Report Status PENDING  Incomplete         Radiology Studies: Dg Chest 2 View  Result Date:  04/19/2016 CLINICAL DATA:  Started feeling bad with sob since yesterday , her husband was dx with pneu yesterday, She is coming from dr office with sats of 84 % RA NOW SHE HAS SATS OF 78. PT PLACED ON nrb 100% AND EKG DONE , NO CP at this time having back and shoulder pain EXAM: CHEST  2 VIEW COMPARISON:  06/06/2014 FINDINGS: There are moderate bilateral pleural effusions, larger on the right. Lungs are hyperexpanded. There is patchy airspace as well as interstitial type opacities in the right middle lobe and right lower lobe increased when compared to the prior exam. This is consistent with the given history of pneumonia. There is no convincing pulmonary edema. Cardiac silhouette is normal in size and configuration. No mediastinal or hilar masses or convincing adenopathy. No pneumothorax. Bony thorax is demineralized but grossly intact. IMPRESSION: 1. Patchy airspace interstitial opacities at the right lung base consistent with pneumonia superimposed on COPD. 2. Moderate pleural effusions, right greater than left. No convincing pulmonary edema. Electronically Signed   By: Lajean Manes M.D.   On: 04/19/2016  17:22       Scheduled Meds: . atenolol  25 mg Oral BID  . azithromycin  500 mg Intravenous Q24H  . cefTRIAXone (ROCEPHIN)  IV  1 g Intravenous Q24H  . cholecalciferol  1,000 Units Oral Daily  . enoxaparin (LOVENOX) injection  30 mg Subcutaneous Q24H  . feeding supplement (ENSURE ENLIVE)  237 mL Oral BID BM  . furosemide  20 mg Oral BID  . ipratropium-albuterol  3 mL Nebulization Q6H  . levothyroxine  50 mcg Oral QAC breakfast  . losartan  25 mg Oral Daily  . magnesium oxide  400 mg Oral Daily  . methylPREDNISolone (SOLU-MEDROL) injection  40 mg Intravenous Q12H  . pantoprazole  40 mg Oral Daily  . vancomycin  500 mg Intravenous Q24H  . vitamin C  500 mg Oral Daily   Continuous Infusions: . sodium chloride 65 mL/hr at 04/21/16 0908     LOS: 2 days    Time spent: 25 minutes   Kathie Dike, MD [facility]  If 7PM-7AM, please contact night-coverage [email or web address] Password [email or web address] 04/21/2016, 11:01 AM   By signing my name below, I, Hilbert Odor, attest that this documentation has been prepared under the direction and in the presence of Kathie Dike, MD. Electronically signed: Hilbert Odor, Scribe.  04/21/16, 11:07 AM  I, Kathie Dike, personally performed the services described in this documentation. All medical record entries made by the scribe were at my discretion and in my presence. Kathie Dike, MD

## 2016-04-21 NOTE — Care Management Important Message (Signed)
Important Message  Patient Details  Name: Andrea [name] MRN: [id number] Date of Birth: 11/23/1928   Medicare Important Message Given:  Yes    Sherald Barge, RN 04/21/2016, 10:32 AM

## 2016-04-21 NOTE — Progress Notes (Signed)
[facility] for Vancomycin per Pharmacy, Rocephin and Zmax per MD Indication: Pneumonia  Allergies  Allergen Reactions  . Sulfa Antibiotics Palpitations    Unknown   Patient Measurements: Height: _0  (160 cm) Weight: 99 lb 6.4 oz (45.1 kg) IBW/kg (Calculated) : 52.4  Vital Signs: Temp: 98 F (36.7 C) (07/26 0650) Temp Source: Oral (07/26 0650) BP: 110/71 (07/26 0650) Pulse Rate: 70 (07/26 0650)  Labs:  Recent Labs  04/19/16 1607 04/20/16 0526 04/21/16 0551  WBC 20.8* 12.8* 12.3*  HGB 12.9 11.2* 11.4*  PLT 225 191 227  CREATININE 1.07* 1.13* 1.89*   Estimated Creatinine Clearance: 15.5 mL/min (by C-G formula based on SCr of 1.89 mg/dL).  No results for input(s): VANCOTROUGH, VANCOPEAK, VANCORANDOM, GENTTROUGH, GENTPEAK, GENTRANDOM, TOBRATROUGH, TOBRAPEAK, TOBRARND, AMIKACINPEAK, AMIKACINTROU, AMIKACIN in the last 72 hours.   Microbiology: Recent Results (from the past 720 hour(s))  Culture, blood (routine x 2) Call MD if unable to obtain prior to antibiotics being given     Status: None (Preliminary result)   Collection Time: 04/19/16 11:35 PM  Result Value Ref Range Status   Specimen Description BLOOD RIGHT ANTECUBITAL  Final   Special Requests   Final    BOTTLES DRAWN AEROBIC AND ANAEROBIC AEB=12CC ANA=6CC   Culture NO GROWTH 1 DAY  Final   Report Status PENDING  Incomplete  Culture, blood (routine x 2) Call MD if unable to obtain prior to antibiotics being given     Status: None (Preliminary result)   Collection Time: 04/19/16 11:44 PM  Result Value Ref Range Status   Specimen Description BLOOD LEFT ANTECUBITAL  Final   Special Requests   Final    BOTTLES DRAWN AEROBIC AND ANAEROBIC AEB=12CC ANA=8CC   Culture NO GROWTH 1 DAY  Final   Report Status PENDING  Incomplete   Medical History: Past Medical History:  Diagnosis Date  . Aortic regurgitation   . Bronchiectasis ([place])   . COPD (chronic obstructive pulmonary disease)  ([place])   . DJD (degenerative joint disease)   . HLD (hyperlipidemia)   . HTN (hypertension)   . Mild mitral regurgitation by prior echocardiogram   . Mild tricuspid regurgitation   . Palpitations   . Thyroid disease    Assessment: 82 yo female with COPD exacerbation and superimposed pneumonia to be admitted and started on IV antibiotics empirically.  Elderly, small body habitus.  Worsening renal fxn, SCr.  BCx pending.  Vancomycin 7/24 >> Rocephin 7/24 >> Zithromax 7/24 >>  Goal of Therapy:  Vancomycin troughs 15-20 mcg/ml  Plan: Change Vancomycin to 580m IV q48hrs, renally adjusted Goal trough level 15-20, Check trough at steady state Continue Rocephin and Zithromax per MD orders (no renal adjustment needed) Monitor labs, renal fxn, progress, and c/s  SHart Robinsons PharmD Clinical Pharmacist Pager:  [phone]/26/2017

## 2016-04-22 ENCOUNTER — Inpatient Hospital Stay (HOSPITAL_COMMUNITY): Payer: Medicare Other

## 2016-04-22 DIAGNOSIS — J918 Pleural effusion in other conditions classified elsewhere: Secondary | ICD-10-CM

## 2016-04-22 DIAGNOSIS — J90 Pleural effusion, not elsewhere classified: Secondary | ICD-10-CM

## 2016-04-22 LAB — ECHOCARDIOGRAM COMPLETE
AVLVOTPG: 4 mmHg
CHL CUP MV DEC (S): 225
CHL CUP RV SYS PRESS: 55 mmHg
CHL CUP TV REG PEAK VELOCITY: 360 cm/s
E decel time: 225 msec
EERAT: 17.76
FS: 49 % — AB (ref 28–44)
Height: 63 in
IV/PV OW: 1.25
LA ID, A-P, ES: 31 mm
LA vol A4C: 36.8 ml
LA vol index: 29.9 mL/m2
LADIAMINDEX: 2.2 cm/m2
LAVOL: 42.1 mL
LDCA: 2.27 cm2
LEFT ATRIUM END SYS DIAM: 31 mm
LV TDI E'LATERAL: 6.42
LV sys vol: 22 mL (ref 14–42)
LVDIAVOL: 60 mL (ref 46–106)
LVDIAVOLIN: 43 mL/m2
LVEEAVG: 17.76
LVEEMED: 17.76
LVELAT: 6.42 cm/s
LVOT SV: 50 mL
LVOT VTI: 21.9 cm
LVOTD: 17 mm
LVOTPV: 95.3 cm/s
LVSYSVOLIN: 16 mL/m2
Lateral S' vel: 12.5 cm/s
MV pk E vel: 114 m/s
MVPG: 5 mmHg
MVPKAVEL: 49 m/s
P 1/2 time: 309 ms
PW: 9.36 mm — AB (ref 0.6–1.1)
RV TAPSE: 22.2 mm
Simpson's disk: 63
Stroke v: 38 ml
TDI e' medial: 6.96
TR max vel: 360 cm/s
Weight: 1590.4 oz

## 2016-04-22 LAB — LEGIONELLA PNEUMOPHILA SEROGP 1 UR AG: L. pneumophila Serogp 1 Ur Ag: NEGATIVE

## 2016-04-22 LAB — BASIC METABOLIC PANEL
Anion gap: 10 (ref 5–15)
BUN: 62 mg/dL — ABNORMAL HIGH (ref 6–20)
CHLORIDE: 91 mmol/L — AB (ref 101–111)
CO2: 28 mmol/L (ref 22–32)
Calcium: 8.6 mg/dL — ABNORMAL LOW (ref 8.9–10.3)
Creatinine, Ser: 2.81 mg/dL — ABNORMAL HIGH (ref 0.44–1.00)
GFR calc non Af Amer: 14 mL/min — ABNORMAL LOW (ref >60)
GFR, EST AFRICAN AMERICAN: 17 mL/min — AB (ref >60)
Glucose, Bld: 141 mg/dL — ABNORMAL HIGH (ref 65–99)
POTASSIUM: 4.5 mmol/L (ref 3.5–5.1)
SODIUM: 129 mmol/L — AB (ref 135–145)

## 2016-04-22 MED ORDER — METHYLPREDNISOLONE SODIUM SUCC 40 MG IJ SOLR
40.0000 mg | Freq: Every day | INTRAMUSCULAR | Status: DC
  Administered 2016-04-23: 40 mg via INTRAVENOUS
  Filled 2016-04-22: qty 1

## 2016-04-22 MED ORDER — ALPRAZOLAM 0.25 MG PO TABS
0.2500 mg | ORAL_TABLET | Freq: Every evening | ORAL | Status: DC | PRN
  Administered 2016-04-23 – 2016-04-25 (×3): 0.25 mg via ORAL
  Filled 2016-04-22 (×3): qty 1

## 2016-04-22 MED ORDER — PHENOL 1.4 % MT LIQD
1.0000 | OROMUCOSAL | Status: DC | PRN
  Filled 2016-04-22: qty 177

## 2016-04-22 MED ORDER — SODIUM CHLORIDE 0.9 % IV SOLN
INTRAVENOUS | Status: DC
  Administered 2016-04-22 – 2016-04-24 (×4): via INTRAVENOUS

## 2016-04-22 MED ORDER — OXYMETAZOLINE HCL 0.05 % NA SOLN
2.0000 | Freq: Once | NASAL | Status: AC
  Administered 2016-04-22: 2 via NASAL
  Filled 2016-04-22: qty 15

## 2016-04-22 NOTE — Progress Notes (Signed)
PROGRESS NOTE    Andrea [name]  KMQ:[id number] DOB: January 01, 1929 DOA: 04/19/2016 PCP: Alesia Richards, MD  Outpatient Specialists:   Cardiology: Kirk Ruths, MD  Dermatology: Rolm Bookbinder. MD  Gastroenterology: Erskine Emery, MD  Pulmonary Disease: Baird Lyons, MD   Brief Narrative:  34 yof with hx of COPD for which she is on nighttime oxygen presented with complaints of SOB. In the ED she was noted to hypoxic on room air with saturation rate of 77%, hypotensive, and with labs notable for leukocytosis, and hyponatremia. Her CXR revealed patchy airspace opacities at the right lung base superimposed on COPD, and right greater than left moderate pleural effusions. She was admitted for further evaluation and management. Vanc has been discontinued and Lasix and cozaar are being held due to worsening renal function. Unfortunately, her renal function has continued to worsen so will order nephrology consult. Update ECHO in process.    Assessment & Plan: Principal Problem:   CAP (community acquired pneumonia) Active Problems:   Essential hypertension   COPD with emphysema ([place])   Hypothyroidism   Hyponatremia   Tachycardia   Protein-calorie malnutrition, severe   Bilateral pleural effusion   COPD with exacerbation (HCC)   Acute on chronic respiratory failure with hypoxia ([place])   1. Acute on chronic respiratory failure secondary to CAP and COPD exacerbation. Improving. Continue to wean as tolerated. 2. CAP, improving. Initially started on Rocephin, Vancomycin, and Azithromycin. She remains afebrile with improvement in leukocytosis. Strep pneumo antigen negative. Legionella antigen pending. BC no growth 1 day. Vanc has been discontinued due to worsening renal function. 3. COPD exacerbation. Continue nebs, steroids, and abx. Slowly improving. No evidence of wheeze. Will start to taper steroids. 4. AKI likely multifactorial in the setting of Vanc/lasix/cozaar. Lasix/cozaar being  held. Vanc has been discontinued. Renal function continues to worsen, will consult nephrology. 5. Diastolic CHF. Past echo from January 2017 showed an EF of 55-60%. Will follow up with repeat ECHO.  6. Essential HTN, stable.  7. Hyponatremia. Continue gentle hydration so as to not precipitate decompensated CHF.  8. Protein-calorie malnutrition, severe. Nutrition following.  9. Throat pain. No evidence of thrush or exudates. Will prescribe septic spray PRN.   DVT prophylaxis: Lovenox Code Status: Full Family Communication: Discussed with patient, no family present at bedside. Disposition Plan: Anticipate discharge once renal function improves.   Consultants:   Nutrition   Procedures:   None  Antimicrobials:   Rocephin 7/24>>    Vancomycin 7/24>> 7/26  Azithromycin 7/24>>  Subjective: Complains of difficult/ pain swallowing and breathing. Breathing is roughly the same as yesterday, and no reports of wheeze. Non-productive cough. She has not noticed any new onset edema.  Objective: Vitals:   04/21/16 1400 04/21/16 1941 04/21/16 2107 04/22/16 0518  BP: (!) 100/41  (!) 106/44 (!) 111/46  Pulse: 79  93 73  Resp: _0 Temp: 97.6 F (36.4 C)  97.9 F (36.6 C) 97.7 F (36.5 C)  TempSrc: Oral  Oral Oral  SpO2: 98% 97% 97% 98%  Weight:      Height:        Intake/Output Summary (Last 24 hours) at 04/22/16 0629 Last data filed at 04/21/16 1843  Gross per 24 hour  Intake              840 ml  Output              200 ml  Net  640 ml   Filed Weights   04/19/16 2006 04/19/16 2318  Weight: 44.5 kg (98 lb) 45.1 kg (99 lb 6.4 oz)   Examination:  General exam: Appears calm and comfortable  Respiratory system: diminished breath sounds bilaterall Cardiovascular system: S1 & S2 heard, RRR. No JVD, murmurs, rubs, gallops or clicks. Trace edema bilaterally Gastrointestinal system: Abdomen is nondistended, soft and nontender. No organomegaly or masses felt.  Normal bowel sounds heard. Central nervous system: Alert and oriented. No focal neurological deficits. Extremities: Symmetric 5 x 5 power. Skin: No rashes, lesions or ulcers Psychiatry: Judgement and insight appear normal. Mood & affect appropriate.   Data Reviewed:  CBC:  Recent Labs Lab 04/19/16 1607 04/20/16 0526 04/21/16 0551  WBC 20.8* 12.8* 12.3*  NEUTROABS 18.8* 11.1*  --   HGB 12.9 11.2* 11.4*  HCT 39.5 34.8* 34.6*  MCV 93.2 94.1 93.8  PLT 225 191 814   Basic Metabolic Panel:  Recent Labs Lab 04/19/16 1607 04/20/16 0526 04/21/16 0551  NA 131* 130* 130*  K 4.1 3.7 4.5  CL 92* 92* 91*  CO2 _0 GLUCOSE 85 85 155*  BUN 26* 38* 49*  CREATININE 1.07* 1.13* 1.89*  CALCIUM 9.2 8.1* 8.1*   GFR: Estimated Creatinine Clearance: 15.5 mL/min (by C-G formula based on SCr of 1.89 mg/dL). Liver Function Tests:  Recent Labs Lab 04/19/16 1607 04/20/16 0526  AST 24 16  ALT 12* 10*  ALKPHOS 72 52  BILITOT 1.2 0.7  PROT 7.7 5.9*  ALBUMIN 3.5 2.7*    Recent Labs Lab 04/19/16 2032  TROPONINI <0.03   Thyroid Function Tests:  Recent Labs  04/19/16 2335  TSH 0.672   Urine analysis:    Component Value Date/Time   COLORURINE YELLOW 09/25/2015 1547   APPEARANCEUR CLEAR 09/25/2015 1547   LABSPEC 1.008 09/25/2015 1547   PHURINE 7.0 09/25/2015 1547   GLUCOSEU NEGATIVE 09/25/2015 1547   HGBUR NEGATIVE 09/25/2015 1547   BILIRUBINUR NEGATIVE 09/25/2015 1547   KETONESUR NEGATIVE 09/25/2015 1547   PROTEINUR NEGATIVE 09/25/2015 1547   UROBILINOGEN 0.2 08/20/2014 1613   NITRITE NEGATIVE 09/25/2015 1547   LEUKOCYTESUR 1+ (A) 09/25/2015 1547   Sepsis Labs: _1 (procalcitonin:4,lacticidven:4)  ) Recent Results (from the past 240 hour(s))  Culture, blood (routine x 2) Call MD if unable to obtain prior to antibiotics being given     Status: None (Preliminary result)   Collection Time: 04/19/16 11:35 PM  Result Value Ref Range Status   Specimen  Description BLOOD RIGHT ANTECUBITAL  Final   Special Requests   Final    BOTTLES DRAWN AEROBIC AND ANAEROBIC AEB=12CC ANA=6CC   Culture NO GROWTH 1 DAY  Final   Report Status PENDING  Incomplete  Culture, blood (routine x 2) Call MD if unable to obtain prior to antibiotics being given     Status: None (Preliminary result)   Collection Time: 04/19/16 11:44 PM  Result Value Ref Range Status   Specimen Description BLOOD LEFT ANTECUBITAL  Final   Special Requests   Final    BOTTLES DRAWN AEROBIC AND ANAEROBIC AEB=12CC ANA=8CC   Culture NO GROWTH 1 DAY  Final   Report Status PENDING  Incomplete    Scheduled Meds: . atenolol  25 mg Oral BID  . azithromycin  500 mg Intravenous Q24H  . cefTRIAXone (ROCEPHIN)  IV  1 g Intravenous Q24H  . cholecalciferol  1,000 Units Oral Daily  . enoxaparin (LOVENOX) injection  30 mg Subcutaneous Q24H  . feeding supplement (ENSURE  ENLIVE)  237 mL Oral BID BM  . ipratropium-albuterol  3 mL Nebulization TID  . levothyroxine  50 mcg Oral QAC breakfast  . magnesium oxide  400 mg Oral Daily  . methylPREDNISolone (SOLU-MEDROL) injection  40 mg Intravenous Q12H  . pantoprazole  40 mg Oral Daily  . vitamin C  500 mg Oral Daily   Continuous Infusions: . sodium chloride 65 mL/hr at 04/21/16 1752     LOS: 3 days    Time spent: 25 minutes   Kathie Dike, MD [facility]  If 7PM-7AM, please contact night-coverage [email or web address] Password [email or web address] 04/22/2016, 6:29 AM   By signing my name below, I, Delene Ruffini, attest that this documentation has been prepared under the direction and in the presence of Kathie Dike, MD. Electronically Signed: Delene Ruffini 04/22/16 11:48am  I, Dr. Kathie Dike, personally performed the services described in this documentaiton. All medical record entries made by the scribe were at my direction and in my presence. I have reviewed the chart and agree that the record reflects my personal performance and is accurate  and complete  Kathie Dike, MD, 04/22/2016 12:15 PM

## 2016-04-22 NOTE — Progress Notes (Signed)
Dr. Roderic Palau made aware of patient's nose bleed, orders received.

## 2016-04-22 NOTE — Progress Notes (Signed)
*  PRELIMINARY RESULTS* Echocardiogram 2D Echocardiogram has been performed.  Samuel Germany 04/22/2016, 1:49 PM

## 2016-04-22 NOTE — Progress Notes (Signed)
1525 Patient assisted to the bathroom and upon returning to bed, fresh red blood noted dripping from patient's nose. Patient assisted back to bed, supplied with tissues and instructed to pinch the bridge of her nose. Patient cleansed and lying in bed at this time. MD notified.

## 2016-04-22 NOTE — Progress Notes (Signed)
Ambulated in the hall up to the nurses station and back without oxygen, saturations on room air 82-85%, no acute distress noted while ambulating, returned to room, oxygen at 2L/[place] applied, saturations increased 93%.

## 2016-04-23 ENCOUNTER — Inpatient Hospital Stay (HOSPITAL_COMMUNITY): Payer: Medicare Other

## 2016-04-23 LAB — BASIC METABOLIC PANEL
ANION GAP: 6 (ref 5–15)
BUN: 79 mg/dL — AB (ref 6–20)
CO2: 26 mmol/L (ref 22–32)
Calcium: 8.2 mg/dL — ABNORMAL LOW (ref 8.9–10.3)
Chloride: 97 mmol/L — ABNORMAL LOW (ref 101–111)
Creatinine, Ser: 2.79 mg/dL — ABNORMAL HIGH (ref 0.44–1.00)
GFR calc Af Amer: 17 mL/min — ABNORMAL LOW (ref >60)
GFR, EST NON AFRICAN AMERICAN: 14 mL/min — AB (ref >60)
GLUCOSE: 134 mg/dL — AB (ref 65–99)
POTASSIUM: 4.4 mmol/L (ref 3.5–5.1)
Sodium: 129 mmol/L — ABNORMAL LOW (ref 135–145)

## 2016-04-23 MED ORDER — PREDNISONE 20 MG PO TABS
40.0000 mg | ORAL_TABLET | Freq: Every day | ORAL | Status: DC
  Administered 2016-04-24 – 2016-04-25 (×2): 40 mg via ORAL
  Filled 2016-04-23 (×2): qty 2

## 2016-04-23 NOTE — Care Management Important Message (Signed)
Important Message  Patient Details  Name: Andrea [name] MRN: [id number] Date of Birth: June 26, 1929   Medicare Important Message Given:  Yes    Sherald Barge, RN 04/23/2016, 9:16 AM

## 2016-04-23 NOTE — Consult Note (Signed)
Reason for Consult: Acute kidney injury superimposed on chronic Referring Physician: Dr. Darlin Drop is an 82 y.o. female.  HPI: She is a patient was history of COPD, bronchiectasis, hypertension, presently came to the hospital with complaints of difficulty breathing, cough with sputum production. When she was evaluated she was found to have hypoxemia. Chest x-ray showed bilateral effusion and infiltrate hence admitted to the hospital for possible pneumonia/CHF. Presently consult is called because of worsening of her renal failure. Patient presently feels much better. She denies any difficulty breathing. She is on oxygen. She denies any nausea or vomiting.  Past Medical History:  Diagnosis Date  . Aortic regurgitation   . Bronchiectasis ([place])   . COPD (chronic obstructive pulmonary disease) ([place])   . DJD (degenerative joint disease)   . HLD (hyperlipidemia)   . HTN (hypertension)   . Mild mitral regurgitation by prior echocardiogram   . Mild tricuspid regurgitation   . Palpitations   . Thyroid disease     Past Surgical History:  Procedure Laterality Date  . ABDOMINAL HYSTERECTOMY  1993  . CATARACT EXTRACTION, BILATERAL      Family History  Problem Relation Age of Onset  . Emphysema Mother   . Diabetes Mother   . Cancer Father   . Emphysema Sister   . Emphysema Sister   . Rheumatic fever Sister   . Heart attack Sister   . Diabetes Sister   . Diabetes Brother   . Asthma Brother     as a child    Social History:  reports that she quit smoking about 41 years ago. Her smoking use included Cigarettes. She has a 20.00 pack-year smoking history. She has never used smokeless tobacco. She reports that she does not drink alcohol or use drugs.  Allergies:  Allergies  Allergen Reactions  . Sulfa Antibiotics Palpitations    Unknown    Medications: I have reviewed the patient's current medications.  Results for orders placed or performed during the hospital encounter of  04/19/16 (from the past 48 hour(s))  Basic metabolic panel     Status: Abnormal   Collection Time: 04/22/16  5:41 AM  Result Value Ref Range   Sodium 129 (L) 135 - 145 mmol/L   Potassium 4.5 3.5 - 5.1 mmol/L   Chloride 91 (L) 101 - 111 mmol/L   CO2 28 22 - 32 mmol/L   Glucose, Bld 141 (H) 65 - 99 mg/dL   BUN 62 (H) 6 - 20 mg/dL   Creatinine, Ser 2.81 (H) 0.44 - 1.00 mg/dL   Calcium 8.6 (L) 8.9 - 10.3 mg/dL   GFR calc non Af Amer 14 (L) >60 mL/min   GFR calc Af Amer 17 (L) >60 mL/min    Comment: (NOTE) The eGFR has been calculated using the CKD EPI equation. This calculation has not been validated in all clinical situations. eGFR's persistently <60 mL/min signify possible Chronic Kidney Disease.    Anion gap 10 5 - 15  Basic metabolic panel     Status: Abnormal   Collection Time: 04/23/16  4:51 AM  Result Value Ref Range   Sodium 129 (L) 135 - 145 mmol/L   Potassium 4.4 3.5 - 5.1 mmol/L   Chloride 97 (L) 101 - 111 mmol/L   CO2 26 22 - 32 mmol/L   Glucose, Bld 134 (H) 65 - 99 mg/dL   BUN 79 (H) 6 - 20 mg/dL   Creatinine, Ser 2.79 (H) 0.44 - 1.00 mg/dL  Calcium 8.2 (L) 8.9 - 10.3 mg/dL   GFR calc non Af Amer 14 (L) >60 mL/min   GFR calc Af Amer 17 (L) >60 mL/min    Comment: (NOTE) The eGFR has been calculated using the CKD EPI equation. This calculation has not been validated in all clinical situations. eGFR's persistently <60 mL/min signify possible Chronic Kidney Disease.    Anion gap 6 5 - 15    No results found.  Review of Systems  Constitutional: Negative for chills, fever and malaise/fatigue.  Respiratory: Positive for cough, sputum production and shortness of breath.   Cardiovascular: Positive for leg swelling. Negative for chest pain and orthopnea.  Gastrointestinal: Negative for diarrhea, nausea and vomiting.  Neurological: Positive for weakness.   Blood pressure 133/60, pulse 83, temperature 98 F (36.7 C), temperature source Oral, resp. rate 20, height 5'  3" (1.6 m), weight 45.1 kg (99 lb 6.4 oz), SpO2 94 %. Physical Exam  Constitutional: She is oriented to person, place, and time.  ematiated  Eyes: No scleral icterus.  Cardiovascular: Normal rate and regular rhythm.   Murmur heard. Respiratory: No respiratory distress. She has no wheezes. She has rales.  GI: There is no tenderness.  Musculoskeletal: She exhibits edema.  Neurological: She is alert and oriented to person, place, and time.    Assessment/Plan: Problem #1 acute kidney injury superimposed on chronic. Her creatinine on 03/1216 was 0.79, on 04/19/16 was 1.07 and her EGFR was 46 mL/m hence stage III. Since her hospital admission her creatinine has been continuously increasing. The etiology was thought to be secondary to vancomycin/Cozaar/cardiorenal/prerenal. Patient is off from all those medications including diuretics. Her creatinine is slightly better. She doesn't have any nausea or vomiting. Problem #2 difficulty in breathing: Possibly multifactorial including COPD/bronchiectasis/pleural effusion and CHF. Presently patient is on oxygen and she is feeling much better. Problem #3 history of aortic and mitral regurg Problem #4 history of hypertension: Her blood pressure is reasonably controlled Problem #5 hyponatremia: Possibly hypervolemic hyponatremia. Problem #6 history of hypothyroidism Problem #7 history of patchy infiltrate Problem #8 bilateral pleural effusion Plan: 1]We'll continue his hydration 2] we'll do ultrasound of the kidneys 3] we'll check in a common complement, hepatitis B surface antigen, hepatitis C antibody 4] we'll do 24-hour urine for protein. 5] we'll check renal panel in the morning Andrea [name] S 04/23/2016, 9:03 AM

## 2016-04-23 NOTE — Progress Notes (Signed)
PROGRESS NOTE    Andrea [name]  TWK:[id number] DOB: [birth year] DOA: 04/19/2016 PCP: Alesia Richards, MD  Outpatient Specialists:   Cardiology: Kirk Ruths, MD  Dermatology: Rolm Bookbinder. MD  Gastroenterology: Erskine Emery, MD  Pulmonary Disease: Baird Lyons, MD   Brief Narrative:  29 yof with hx of COPD for which she is on nighttime oxygen presented with complaints of SOB. In the ED she was noted to hypoxic on room air with saturation rate of 77%, hypotensive, and with labs notable for leukocytosis, and hyponatremia. Her CXR revealed patchy airspace opacities at the right lung base superimposed on COPD, and right greater than left moderate pleural effusions. She was admitted for further evaluation and management. Vanc has been discontinued and Lasix and cozaar are being held due to worsening renal function. Unfortunately, her renal function has continued to worsen so will order nephrology consult. Update ECHO showed EF of 60-65%.    Assessment & Plan: Principal Problem:   CAP (community acquired pneumonia) Active Problems:   Essential hypertension   COPD with emphysema ([place])   Hypothyroidism   Hyponatremia   Tachycardia   Protein-calorie malnutrition, severe   Bilateral pleural effusion   COPD with exacerbation ([facility])   Acute on chronic respiratory failure with hypoxia ([place])   1. Acute on chronic respiratory failure secondary to CAP and COPD exacerbation. Improving. Continue to wean oxygen as tolerated. 2. CAP, improving. Initially started on Rocephin, Vancomycin, and Azithromycin. She remains afebrile with improvement in leukocytosis. Strep pneumo antigen and Legionella antigen negative. BC no growth 2 days. Vanc has been discontinued due to worsening renal function. 3. COPD exacerbation. Continue nebs, steroids, and abx. Slowly improving. No evidence of wheeze. Taper steroids. 4. AKI likely multifactorial in the setting of Vanc/lasix/cozaar. Lasix/cozaar being held.  Vanc has been discontinued. Renal function has not improved. Nephrology has been consulted. Will follow up with renal US and 24 hr urine. Check BMP in the AM. Continue IVF for now 5. Diastolic CHF. Past echo from January 2017 showed an EF of 55-60%. Repeat ECHO shows EF 60-65%, with results as below.  6. Essential HTN, stable.  7. Hyponatremia. Continue gentle hydration so as to not precipitate decompensated CHF.  8. Protein-calorie malnutrition, severe. Nutrition following.  9. Throat pain. No evidence of thrush or exudates. Chloraseptic spray PRN.   DVT prophylaxis: Lovenox Code Status: Full Family Communication: Discussed with patient, no family present at bedside.  Disposition Plan: Anticipate discharge home once renal function improves.   Consultants:   Nutrition   Nephrology  Procedures:   ECHO - Left ventricle: The cavity size was normal. Wall thickness was   increased in a pattern of mild LVH. Systolic function was normal.   The estimated ejection fraction was in the range of 60% to 65%.   Wall motion was normal; there were no regional wall motion   abnormalities. Features are consistent with a pseudonormal left   ventricular filling pattern, with concomitant abnormal relaxation   and increased filling pressure (grade 2 diastolic dysfunction).   Doppler parameters are consistent with high ventricular filling   pressure. - Aortic valve: Trileaflet; mildly thickened leaflets. There was   mild regurgitation. - Mitral valve: Mildly thickened leaflets . There was mild to   moderate regurgitation. - Left atrium: The atrium was mildly dilated. - Right atrium: The atrium was mildly dilated. - Tricuspid valve: Mildly thickened leaflets. There was moderate   regurgitation. - Pulmonary arteries: PA peak pressure: 55 mm Hg (S).  Antimicrobials:  Rocephin 7/24>>    Vancomycin 7/24>> 7/26  Azithromycin 7/24>>  Subjective: Breathing well. No cough or  wheezing  Objective: Vitals:   04/22/16 1437 04/22/16 1944 04/22/16 2057 04/23/16 0500  BP:   127/64 133/60  Pulse:   85 83  Resp:   20 20  Temp:   97.8 F (36.6 C) 98 F (36.7 C)  TempSrc:   Oral Oral  SpO2: 96% 93% 95% 94%  Weight:      Height:        Intake/Output Summary (Last 24 hours) at 04/23/16 0759 Last data filed at 04/23/16 0600  Gross per 24 hour  Intake          3825.91 ml  Output             1700 ml  Net          2125.91 ml   Filed Weights   04/19/16 2006 04/19/16 2318  Weight: 44.5 kg (98 lb) 45.1 kg (99 lb 6.4 oz)   Examination:  General exam: Appears calm and comfortable  Respiratory system: Clear to auscultation. Respiratory effort normal. Cardiovascular system: S1 & S2 heard, RRR. No JVD, murmurs, rubs, gallops or clicks. No pedal edema. Gastrointestinal system: Abdomen is nondistended, soft and nontender. No organomegaly or masses felt. Normal bowel sounds heard. Central nervous system: Alert and oriented. No focal neurological deficits. Extremities: Symmetric 5 x 5 power. Skin: No rashes, lesions or ulcers Psychiatry: Judgement and insight appear normal. Mood & affect appropriate.   Data Reviewed:  CBC:  Recent Labs Lab 04/19/16 1607 04/20/16 0526 04/21/16 0551  WBC 20.8* 12.8* 12.3*  NEUTROABS 18.8* 11.1*  --   HGB 12.9 11.2* 11.4*  HCT 39.5 34.8* 34.6*  MCV 93.2 94.1 93.8  PLT 225 191 888   Basic Metabolic Panel:  Recent Labs Lab 04/19/16 1607 04/20/16 0526 04/21/16 0551 04/22/16 0541 04/23/16 0451  NA 131* 130* 130* 129* 129*  K 4.1 3.7 4.5 4.5 4.4  CL 92* 92* 91* 91* 97*  CO2 _0 GLUCOSE 85 85 155* 141* 134*  BUN 26* 38* 49* 62* 79*  CREATININE 1.07* 1.13* 1.89* 2.81* 2.79*  CALCIUM 9.2 8.1* 8.1* 8.6* 8.2*   GFR: Estimated Creatinine Clearance: 10.5 mL/min (by C-G formula based on SCr of 2.79 mg/dL). Liver Function Tests:  Recent Labs Lab 04/19/16 1607 04/20/16 0526  AST 24 16  ALT 12* 10*  ALKPHOS  72 52  BILITOT 1.2 0.7  PROT 7.7 5.9*  ALBUMIN 3.5 2.7*    Recent Labs Lab 04/19/16 2032  TROPONINI <0.03   Urine analysis:    Component Value Date/Time   COLORURINE YELLOW 09/25/2015 1547   APPEARANCEUR CLEAR 09/25/2015 1547   LABSPEC 1.008 09/25/2015 1547   PHURINE 7.0 09/25/2015 1547   GLUCOSEU NEGATIVE 09/25/2015 1547   HGBUR NEGATIVE 09/25/2015 1547   BILIRUBINUR NEGATIVE 09/25/2015 1547   KETONESUR NEGATIVE 09/25/2015 1547   PROTEINUR NEGATIVE 09/25/2015 1547   UROBILINOGEN 0.2 08/20/2014 1613   NITRITE NEGATIVE 09/25/2015 1547   LEUKOCYTESUR 1+ (A) 09/25/2015 1547   Sepsis Labs: _1 (procalcitonin:4,lacticidven:4)  ) Recent Results (from the past 240 hour(s))  Culture, blood (routine x 2) Call MD if unable to obtain prior to antibiotics being given     Status: None (Preliminary result)   Collection Time: 04/19/16 11:35 PM  Result Value Ref Range Status   Specimen Description BLOOD RIGHT ANTECUBITAL  Final   Special Requests   Final    BOTTLES  DRAWN AEROBIC AND ANAEROBIC AEB=12CC ANA=6CC   Culture NO GROWTH 2 DAYS  Final   Report Status PENDING  Incomplete  Culture, blood (routine x 2) Call MD if unable to obtain prior to antibiotics being given     Status: None (Preliminary result)   Collection Time: 04/19/16 11:44 PM  Result Value Ref Range Status   Specimen Description BLOOD LEFT ANTECUBITAL  Final   Special Requests   Final    BOTTLES DRAWN AEROBIC AND ANAEROBIC AEB=12CC ANA=8CC   Culture NO GROWTH 2 DAYS  Final   Report Status PENDING  Incomplete    Scheduled Meds: . atenolol  25 mg Oral BID  . azithromycin  500 mg Intravenous Q24H  . cefTRIAXone (ROCEPHIN)  IV  1 g Intravenous Q24H  . cholecalciferol  1,000 Units Oral Daily  . feeding supplement (ENSURE ENLIVE)  237 mL Oral BID BM  . ipratropium-albuterol  3 mL Nebulization TID  . levothyroxine  50 mcg Oral QAC breakfast  . magnesium oxide  400 mg Oral Daily  . methylPREDNISolone  (SOLU-MEDROL) injection  40 mg Intravenous Daily  . pantoprazole  40 mg Oral Daily  . vitamin C  500 mg Oral Daily   Continuous Infusions: . sodium chloride 65 mL/hr at 04/23/16 0200     LOS: 4 days    Time spent: 25 minutes   Kathie Dike, MD [facility]  If 7PM-7AM, please contact night-coverage [email or web address] Password [email or web address] 04/23/2016, 7:59 AM   By signing my name below, I, Delene Ruffini, attest that this documentation has been prepared under the direction and in the presence of Kathie Dike, MD. Electronically Signed: Delene Ruffini 04/23/16 2:20pm  I, Dr. Kathie Dike, personally performed the services described in this documentaiton. All medical record entries made by the scribe were at my direction and in my presence. I have reviewed the chart and agree that the record reflects my personal performance and is accurate and complete  Kathie Dike, MD, 04/23/2016 2:38 PM

## 2016-04-24 LAB — RENAL FUNCTION PANEL
ANION GAP: 7 (ref 5–15)
Albumin: 2.5 g/dL — ABNORMAL LOW (ref 3.5–5.0)
BUN: 72 mg/dL — ABNORMAL HIGH (ref 6–20)
CO2: 28 mmol/L (ref 22–32)
Calcium: 8.3 mg/dL — ABNORMAL LOW (ref 8.9–10.3)
Chloride: 99 mmol/L — ABNORMAL LOW (ref 101–111)
Creatinine, Ser: 2.13 mg/dL — ABNORMAL HIGH (ref 0.44–1.00)
GFR calc Af Amer: 23 mL/min — ABNORMAL LOW (ref >60)
GFR calc non Af Amer: 20 mL/min — ABNORMAL LOW (ref >60)
GLUCOSE: 121 mg/dL — AB (ref 65–99)
POTASSIUM: 4.3 mmol/L (ref 3.5–5.1)
Phosphorus: 3.6 mg/dL (ref 2.5–4.6)
SODIUM: 134 mmol/L — AB (ref 135–145)

## 2016-04-24 LAB — MPO/PR-3 (ANCA) ANTIBODIES: ANCA Proteinase 3: <3.5 U/mL (ref 0.0–3.5)

## 2016-04-24 LAB — COMPLEMENT, TOTAL

## 2016-04-24 LAB — PROTEIN, URINE, 24 HOUR
COLLECTION INTERVAL-UPROT: 24 h
PROTEIN, URINE: 13 mg/dL
Protein, 24H Urine: 241 mg/d — ABNORMAL HIGH (ref 50–100)
URINE TOTAL VOLUME-UPROT: 1850 mL

## 2016-04-24 LAB — C3 COMPLEMENT: C3 Complement: 102 mg/dL (ref 82–167)

## 2016-04-24 LAB — HEPATITIS C ANTIBODY: HCV Ab: <0.1 s/co ratio (ref 0.0–0.9)

## 2016-04-24 LAB — C4 COMPLEMENT: Complement C4, Body Fluid: 26 mg/dL (ref 14–44)

## 2016-04-24 LAB — HEPATITIS B SURFACE ANTIGEN: HEP B S AG: NEGATIVE

## 2016-04-24 MED ORDER — AZITHROMYCIN 250 MG PO TABS
500.0000 mg | ORAL_TABLET | Freq: Every day | ORAL | Status: DC
  Administered 2016-04-24: 500 mg via ORAL
  Filled 2016-04-24: qty 2

## 2016-04-24 NOTE — Progress Notes (Signed)
PROGRESS NOTE    Andrea [name]  FPK:[id number] DOB: 1929/09/12 DOA: 04/19/2016 PCP: Alesia Richards, MD  Outpatient Specialists:   Cardiology: Kirk Ruths, MD  Dermatology: Rolm Bookbinder. MD  Gastroenterology: Erskine Emery, MD  Pulmonary Disease: Baird Lyons, MD   Brief Narrative:  55 yof with hx of COPD for which she is on nighttime oxygen presented with complaints of SOB. In the ED she was noted to hypoxic on room air with saturation rate of 77%, hypotensive, and with labs notable for leukocytosis, and hyponatremia. Her CXR revealed patchy airspace opacities at the right lung base superimposed on COPD, and right greater than left moderate pleural effusions. She was admitted for further evaluation and management. Vanc has been discontinued and Lasix and cozaar are being held due to worsening renal function. Unfortunately, her renal function has continued to worsen so nephrology has been consulted. Updated ECHO showed EF of 60-65%.    Assessment & Plan: Principal Problem:   CAP (community acquired pneumonia) Active Problems:   Essential hypertension   COPD with emphysema ([place])   Hypothyroidism   Hyponatremia   Tachycardia   Protein-calorie malnutrition, severe   Bilateral pleural effusion   COPD with exacerbation ([facility])   Acute on chronic respiratory failure with hypoxia ([place])   1. Acute on chronic respiratory failure secondary to CAP and COPD exacerbation. Improving. Continue to wean oxygen as tolerated. 2. CAP, improving. Initially started on Rocephin, Vancomycin, and Azithromycin. She remains afebrile with improvement in leukocytosis. Strep pneumo antigen and Legionella antigen negative. BC no growth. Vanc has been discontinued due to worsening renal function. 3. COPD exacerbation. Continue nebs, steroids, and abx. Slowly improving. No evidence of wheeze. Tapering steroids. 4. AKI likely multifactorial in the setting of Vanc/lasix/cozaar. Lasix/cozaar being held.  Vanc has been discontinued. Nephrology has been consulted, appreciate input. Renal function is beginning to improve. Renal US negative for any hydronephrosis. Continue IVF for now 5. Chronic Diastolic CHF. Past echo from January 2017 showed an EF of 55-60%. Repeat ECHO shows EF 60-65%, with results as below. Appears compensated at this time 6. Essential HTN, stable.  7. Hyponatremia. Improving. Continue gentle hydration so as to not precipitate decompensated CHF.  8. Protein-calorie malnutrition, severe. Nutrition following.  9. Throat pain. No evidence of thrush or exudates. Chloraseptic spray PRN.   DVT prophylaxis: Lovenox Code Status: Full Family Communication: Discussed with patient, no family present at bedside. Disposition Plan: Anticipate discharge home once renal function improves. Hopefully in AM   Consultants:   Nutrition   Nephrology  Procedures:   ECHO - Left ventricle: The cavity size was normal. Wall thickness was   increased in a pattern of mild LVH. Systolic function was normal.   The estimated ejection fraction was in the range of 60% to 65%.   Wall motion was normal; there were no regional wall motion   abnormalities. Features are consistent with a pseudonormal left   ventricular filling pattern, with concomitant abnormal relaxation   and increased filling pressure (grade 2 diastolic dysfunction).   Doppler parameters are consistent with high ventricular filling   pressure. - Aortic valve: Trileaflet; mildly thickened leaflets. There was   mild regurgitation. - Mitral valve: Mildly thickened leaflets . There was mild to   moderate regurgitation. - Left atrium: The atrium was mildly dilated. - Right atrium: The atrium was mildly dilated. - Tricuspid valve: Mildly thickened leaflets. There was moderate   regurgitation. - Pulmonary arteries: PA peak pressure: 55 mm Hg (S).  Antimicrobials:  Rocephin 7/24>>    Vancomycin 7/24>> 7/26  Azithromycin  7/24>>  Subjective: Feeling better today. Shortness of breath improving.   Objective: Vitals:   04/23/16 1451 04/23/16 2023 04/23/16 2136 04/24/16 0639  BP: (!) 120/44  (!) 134/54 (!) 132/45  Pulse: 74  89 78  Resp: _0 Temp:   97.8 F (36.6 C) 98.3 F (36.8 C)  TempSrc:   Oral Oral  SpO2: 98% 96% 97% 96%  Weight:      Height:        Intake/Output Summary (Last 24 hours) at 04/24/16 0725 Last data filed at 04/24/16 9678  Gross per 24 hour  Intake          2329.58 ml  Output             1600 ml  Net           729.58 ml   Filed Weights   04/19/16 2006 04/19/16 2318  Weight: 44.5 kg (98 lb) 45.1 kg (99 lb 6.4 oz)   Examination:  General exam: Appears calm and comfortable  Respiratory system: Clear to auscultation. Respiratory effort normal. Cardiovascular system: S1 & S2 heard, RRR. No JVD, murmurs, rubs, gallops or clicks. No pedal edema. Gastrointestinal system: Abdomen is nondistended, soft and nontender. No organomegaly or masses felt. Normal bowel sounds heard. Central nervous system: Alert and oriented. No focal neurological deficits. Extremities: Symmetric 5 x 5 power. Skin: No rashes, lesions or ulcers Psychiatry: Judgement and insight appear normal. Mood & affect appropriate.   Data Reviewed:  CBC:  Recent Labs Lab 04/19/16 1607 04/20/16 0526 04/21/16 0551  WBC 20.8* 12.8* 12.3*  NEUTROABS 18.8* 11.1*  --   HGB 12.9 11.2* 11.4*  HCT 39.5 34.8* 34.6*  MCV 93.2 94.1 93.8  PLT 225 191 938   Basic Metabolic Panel:  Recent Labs Lab 04/19/16 1607 04/20/16 0526 04/21/16 0551 04/22/16 0541 04/23/16 0451  NA 131* 130* 130* 129* 129*  K 4.1 3.7 4.5 4.5 4.4  CL 92* 92* 91* 91* 97*  CO2 _1 GLUCOSE 85 85 155* 141* 134*  BUN 26* 38* 49* 62* 79*  CREATININE 1.07* 1.13* 1.89* 2.81* 2.79*  CALCIUM 9.2 8.1* 8.1* 8.6* 8.2*   GFR: Estimated Creatinine Clearance: 10.5 mL/min (by C-G formula based on SCr of 2.79 mg/dL). Liver Function  Tests:  Recent Labs Lab 04/19/16 1607 04/20/16 0526  AST 24 16  ALT 12* 10*  ALKPHOS 72 52  BILITOT 1.2 0.7  PROT 7.7 5.9*  ALBUMIN 3.5 2.7*    Recent Labs Lab 04/19/16 2032  TROPONINI <0.03   Urine analysis:    Component Value Date/Time   COLORURINE YELLOW 09/25/2015 1547   APPEARANCEUR CLEAR 09/25/2015 1547   LABSPEC 1.008 09/25/2015 1547   PHURINE 7.0 09/25/2015 1547   GLUCOSEU NEGATIVE 09/25/2015 1547   HGBUR NEGATIVE 09/25/2015 1547   BILIRUBINUR NEGATIVE 09/25/2015 1547   KETONESUR NEGATIVE 09/25/2015 1547   PROTEINUR NEGATIVE 09/25/2015 1547   UROBILINOGEN 0.2 08/20/2014 1613   NITRITE NEGATIVE 09/25/2015 1547   LEUKOCYTESUR 1+ (A) 09/25/2015 1547   Sepsis Labs: _2 (procalcitonin:4,lacticidven:4)  ) Recent Results (from the past 240 hour(s))  Culture, blood (routine x 2) Call MD if unable to obtain prior to antibiotics being given     Status: None (Preliminary result)   Collection Time: 04/19/16 11:35 PM  Result Value Ref Range Status   Specimen Description BLOOD RIGHT ANTECUBITAL  Final   Special Requests  Final    BOTTLES DRAWN AEROBIC AND ANAEROBIC AEB=12CC ANA=6CC   Culture NO GROWTH 2 DAYS  Final   Report Status PENDING  Incomplete  Culture, blood (routine x 2) Call MD if unable to obtain prior to antibiotics being given     Status: None (Preliminary result)   Collection Time: 04/19/16 11:44 PM  Result Value Ref Range Status   Specimen Description BLOOD LEFT ANTECUBITAL  Final   Special Requests   Final    BOTTLES DRAWN AEROBIC AND ANAEROBIC AEB=12CC ANA=8CC   Culture NO GROWTH 2 DAYS  Final   Report Status PENDING  Incomplete    Scheduled Meds: . atenolol  25 mg Oral BID  . azithromycin  500 mg Intravenous Q24H  . cefTRIAXone (ROCEPHIN)  IV  1 g Intravenous Q24H  . cholecalciferol  1,000 Units Oral Daily  . feeding supplement (ENSURE ENLIVE)  237 mL Oral BID BM  . ipratropium-albuterol  3 mL Nebulization TID  . levothyroxine  50 mcg  Oral QAC breakfast  . magnesium oxide  400 mg Oral Daily  . pantoprazole  40 mg Oral Daily  . predniSONE  40 mg Oral Q breakfast  . vitamin C  500 mg Oral Daily   Continuous Infusions: . sodium chloride 75 mL/hr at 04/23/16 2131     LOS: 5 days    Time spent: 25 minutes   Kathie Dike, MD [facility]  If 7PM-7AM, please contact night-coverage [email or web address] Password [email or web address] 04/24/2016, 7:25 AM   By signing my name below, I, Delene Ruffini, attest that this documentation has been prepared under the direction and in the presence of Kathie Dike, MD. Electronically Signed: Delene Ruffini 04/24/16   I, Dr. Kathie Dike, personally performed the services described in this documentaiton. All medical record entries made by the scribe were at my direction and in my presence. I have reviewed the chart and agree that the record reflects my personal performance and is accurate and complete  Kathie Dike, MD, 04/24/2016 7:37 PM

## 2016-04-24 NOTE — Progress Notes (Signed)
Pt ambulated in hallway with standby assist from NT.  Ambulated approximately 100 feet with no complaints.  Will continue to monitor.

## 2016-04-24 NOTE — Progress Notes (Signed)
Subjective: Interval History: has no complaint of difficulty in breathing. Presently she denies also any cough..  Objective: Vital signs in last 24 hours: Temp:  [97.8 F (36.6 C)-98.3 F (36.8 C)] 98.3 F (36.8 C) (07/29 0639) Pulse Rate:  [74-89] 78 (07/29 0639) Resp:  [18-20] 20 (07/29 0639) BP: (120-134)/(44-54) 132/45 (07/29 0639) SpO2:  [92 %-99 %] 99 % (07/29 0750) Weight change:   Intake/Output from previous day: 07/28 0701 - 07/29 0700 In: 2329.6 [I.V.:1779.6; IV Piggyback:550] Out: 1600 [Urine:1600] Intake/Output this shift: No intake/output data recorded.  General appearance: alert, cooperative and no distress Resp: diminished breath sounds bilaterally and wheezes bilaterally Cardio: regular rate and rhythm Extremities: No edema  Lab Results: No results for input([name]): WBC, HGB, HCT, PLT in the last 72 hours. BMET:  Recent Labs  04/23/16 0451 04/24/16 0615  NA 129* 134*  K 4.4 4.3  CL 97* 99*  CO2 26 28  GLUCOSE 134* 121*  BUN 79* 72*  CREATININE 2.79* 2.13*  CALCIUM 8.2* 8.3*   No results for input([name]): PTH in the last 72 hours. Iron Studies: No results for input([name]): IRON, TIBC, TRANSFERRIN, FERRITIN in the last 72 hours.  Studies/Results: US Renal  Result Date: 04/23/2016 CLINICAL DATA:  Acute renal injury EXAM: RENAL / URINARY TRACT ULTRASOUND COMPLETE COMPARISON:  Abdominal pelvic CT scan dated August 15, 2007 FINDINGS: Right Kidney: Length: 8.6 cm. The right kidney lies at the level of the pelvic brim. The renal cortical echotexture is normal. There is no hydronephrosis. Left Kidney: Length: 8.8 cm. The renal cortical echotexture is normal similar to that on the right. There is no hydronephrosis. A previously demonstrated sub cm cortical cyst in the midpole is not evident on today'[name] study. Bladder: The partially distended urinary bladder is normal in appearance. IMPRESSION: The kidneys are somewhat small but this is not greatly changed from the CT scan  of November 2008. The renal cortical echotexture remains lower than that of the liver. There is no hydronephrosis. The urinary bladder is unremarkable. Electronically Signed   By: David  Martinique M.D.   On: 04/23/2016 10:00   I have reviewed the patient'[name] current medications.  Assessment/Plan: Problem #1 acute kidney injury superimposed on chronic presently her BUN and creatinine is improving. Patient is asymptomatic. Problem #2 chronic renal failure: Possibly chronic. Right kidney 8.6 and left kidney 8.8. She has stage III chronic renal failure. Possibly a combination of hypertension/cardiorenal. Problem #3 history of CHF: Presently on diuretics. She is still 1600 mL of urine output and she is asymptomatic.  Problem #4 anemia Problem #5 hyponatremia: Her sodium is 131 and improving. Problem #6 hypertension: Her blood pressure is reasonably controlled Problem #7 hypothyroidism Problem #8 history of COPD: She is on inhaler. She has some wheezing but denies any difficulty breathing. Problem #9 metabolic no disease: Her calcium and phosphorus is range Plan: Decrease IV fluids to 50 mL per hour We'll check her renal panel in the morning.  LOS: 5 days   Andrea [name] 04/24/2016,8:22 AM   he i

## 2016-04-25 DIAGNOSIS — N179 Acute kidney failure, unspecified: Secondary | ICD-10-CM | POA: Diagnosis not present

## 2016-04-25 DIAGNOSIS — E039 Hypothyroidism, unspecified: Secondary | ICD-10-CM

## 2016-04-25 DIAGNOSIS — J439 Emphysema, unspecified: Secondary | ICD-10-CM | POA: Diagnosis not present

## 2016-04-25 LAB — CULTURE, BLOOD (ROUTINE X 2)
CULTURE: NO GROWTH
CULTURE: NO GROWTH

## 2016-04-25 LAB — RENAL FUNCTION PANEL
Albumin: 2.6 g/dL — ABNORMAL LOW (ref 3.5–5.0)
Anion gap: 7 (ref 5–15)
BUN: 64 mg/dL — ABNORMAL HIGH (ref 6–20)
CALCIUM: 8.7 mg/dL — AB (ref 8.9–10.3)
CHLORIDE: 101 mmol/L (ref 101–111)
CO2: 30 mmol/L (ref 22–32)
CREATININE: 1.63 mg/dL — AB (ref 0.44–1.00)
GFR, EST AFRICAN AMERICAN: 32 mL/min — AB (ref >60)
GFR, EST NON AFRICAN AMERICAN: 28 mL/min — AB (ref >60)
Glucose, Bld: 108 mg/dL — ABNORMAL HIGH (ref 65–99)
Phosphorus: 3.7 mg/dL (ref 2.5–4.6)
Potassium: 4.2 mmol/L (ref 3.5–5.1)
Sodium: 138 mmol/L (ref 135–145)

## 2016-04-25 MED ORDER — LOSARTAN POTASSIUM 100 MG PO TABS: 50.0000 mg | ORAL_TABLET | Freq: Every day | ORAL | 1 refills | Status: DC

## 2016-04-25 MED ORDER — AMLODIPINE BESYLATE 10 MG PO TABS: 5.0000 mg | ORAL_TABLET | Freq: Every day | ORAL | Status: DC

## 2016-04-25 MED ORDER — IPRATROPIUM-ALBUTEROL 0.5-2.5 (3) MG/3ML IN SOLN: 3.0000 mL | Freq: Four times a day (QID) | RESPIRATORY_TRACT | 1 refills | Status: DC | PRN

## 2016-04-25 MED ORDER — PREDNISONE 10 MG PO TABS: ORAL_TABLET | ORAL | 0 refills | Status: DC

## 2016-04-25 MED ORDER — ATENOLOL 25 MG PO TABS: 25.0000 mg | ORAL_TABLET | Freq: Two times a day (BID) | ORAL | 1 refills | Status: DC

## 2016-04-25 MED ORDER — FUROSEMIDE 20 MG PO TABS: 20.0000 mg | ORAL_TABLET | Freq: Every day | ORAL | 0 refills | Status: DC | PRN

## 2016-04-25 MED ORDER — FUROSEMIDE 20 MG PO TABS
20.0000 mg | ORAL_TABLET | Freq: Every day | ORAL | Status: DC
  Administered 2016-04-25: 20 mg via ORAL
  Filled 2016-04-25: qty 1

## 2016-04-25 MED ORDER — LOSARTAN POTASSIUM 50 MG PO TABS
50.0000 mg | ORAL_TABLET | Freq: Every day | ORAL | Status: DC
  Administered 2016-04-25: 50 mg via ORAL
  Filled 2016-04-25: qty 1

## 2016-04-25 NOTE — Discharge Summary (Signed)
Physician Discharge Summary  Andrea [name] MWN:[id number] DOB: [birth year] DOA: 04/19/2016  PCP: Andrea Richards, MD  Admit date: 04/19/2016 Discharge date: 04/25/2016  Admitted From: home Disposition:  home  Recommendations for Outpatient Follow-up:  1. Follow up with nephrology as an outpatient in a month 2. Follow up for repeat CXR in 4 weeks to ensure resolution of PNA.  Home Health: yes Equipment/Devices: supplemental O2  Discharge Condition: improved CODE STATUS: full Diet recommendation: regular  Brief/Interim Summary: 44 yof with hx of COPD for which she is on nighttime oxygen presented with complaints of SOB. In the ED she was noted to hypoxic on room air with saturation rate of 77% and with labs notable for leukocytosis, and hyponatremia. Her CXR revealed patchy airspace opacities at the right lung basesuperimposed on COPD, and right greater than left moderate pleural effusions. She was started on IV antibiotics, steroids, and nebulizer treatements and admitted for further evaluation and management. Blood cultures remained negative. Repeat ECHO showed EF of 60-65%. Patient developed worsening renal function, so her Vanc was discontinued, Lasix and cozaar were held, and she was administered gentle IV hydration. Nephrology was consulted, and renal function has improved. It is now approaching baseline. Respiratory status also appears to be back to baseline. She will be discharged with home O2. It is recommended that she follow up for a repeat CXR in 4 weeks to ensure resolution of her PNA.  Discharge Diagnoses:  Principal Problem:   CAP (community acquired pneumonia) Active Problems:   Essential hypertension   COPD with emphysema ([place])   Hypothyroidism   Hyponatremia   Tachycardia   Protein-calorie malnutrition, severe   Bilateral pleural effusion   COPD with exacerbation (HCC)   Acute on chronic respiratory failure with hypoxia ([place])  1. Acute on chronic respiratory  failure secondary to CAP and COPD exacerbation. Improving. Her oxygen has been weaned, but she will need 24 hr home O2.  2. CAP, improved. Initially started on Rocephin, Vancomycin, and Azithromycin. Vanc was discontinued due to worsening renal function. She remained afebrile with improvement in leukocytosis. Strep pneumo antigen and Legionella antigen negative. BC no growth. She has completed a course of anitbiotics in the hospital. 3. COPD exacerbation. Improved. No evidence of wheeze. Steroids have been tapered and she has completed a course of antibiotics. 4. AKI likely multifactorial in the setting of Vanc/lasix/cozaar. Patient was follow by nephrology and was treated with IV hydration, renal function has improved. Renal US negative for any hydronephrosis. Per nephrology, will restart losartan at a lower dose and change lasix to PRN. She will follow up with nephrology in 4 weeks 5. Chronic Diastolic CHF. Past echo from January 2017 showed an EF of 55-60%. Repeat ECHO shows EF 60-65%, with results as below. Appears compensated at this time 6. Essential HTN, stable.  7. Hyponatremia. Improved. Received gentle hydration so as to not precipitate decompensated CHF.  8. Protein-calorie malnutrition, severe. Nutrition consulted.  9. Throat pain. No evidence of thrush or exudates. Chloraseptic spray PRN.  Consultants:   Nutrition   Procedures:   ECHO - Left ventricle: The cavity size was normal. Wall thickness was increased in a pattern of mild LVH. Systolic function was normal. The estimated ejection fraction was in the range of 60% to 65%. Wall motion was normal; there were no regional wall motion abnormalities. Features are consistent with a pseudonormal left ventricular filling pattern, with concomitant abnormal relaxation and increased filling pressure (grade 2 diastolic dysfunction). Doppler parameters are consistent with high ventricular  filling pressure. - Aortic  valve: Trileaflet; mildly thickened leaflets. There was mild regurgitation. - Mitral valve: Mildly thickened leaflets . There was mild to moderate regurgitation. - Left atrium: The atrium was mildly dilated. - Right atrium: The atrium was mildly dilated. - Tricuspid valve: Mildly thickened leaflets. There was moderate regurgitation. - Pulmonary arteries: PA peak pressure: 55 mm Hg (S).  Antimicrobials:   Rocephin 7/24>>    Vancomycin 7/24>> 7/26  Azithromycin 7/24>>  Discharge Instructions  Discharge Instructions    Diet - low sodium heart healthy    Complete by:  As directed   Face-to-face encounter (required for Medicare/Medicaid patients)    Complete by:  As directed   I Andrea [name] certify that this patient is under my care and that I, or a nurse practitioner or physician's assistant working with me, had a face-to-face encounter that meets the physician face-to-face encounter requirements with this patient on 04/25/2016. The encounter with the patient was in whole, or in part for the following medical condition(s) which is the primary reason for home health care (List medical condition): copd exacerbation, pneumonia   The encounter with the patient was in whole, or in part, for the following medical condition, which is the primary reason for home health care:  pneumonia, respiratory failure, copd exacerbation   I certify that, based on my findings, the following services are medically necessary home health services:   Nursing Physical therapy     Reason for Medically Necessary Home Health Services:  Skilled Nursing- Skilled Assessment/Observation   My clinical findings support the need for the above services:  Shortness of breath with activity   Further, I certify that my clinical findings support that this patient is homebound due to:  Shortness of Breath with activity   Home Health    Complete by:  As directed   To provide the following care/treatments:   RN PT      Increase activity slowly    Complete by:  As directed       Medication List    STOP taking these medications   triamterene-hydrochlorothiazide 75-50 MG tablet Commonly known as:  MAXZIDE     TAKE these medications   ALPRAZolam 0.5 MG tablet Commonly known as:  XANAX Take 1/2 to 1 tablet 3 x day if needed for nerves   amLODipine 10 MG tablet Commonly known as:  NORVASC Take 0.5 tablets (5 mg total) by mouth daily.   atenolol 25 MG tablet Commonly known as:  TENORMIN Take 1 tablet (25 mg total) by mouth 2 (two) times daily. What changed:  See the new instructions.   cholecalciferol 1000 units tablet Commonly known as:  VITAMIN D Take 1,000 Units by mouth daily.   furosemide 20 MG tablet Commonly known as:  LASIX Take 1 tablet (20 mg total) by mouth daily as needed for fluid. Take 1 tablet by mouth daily with breakfast for leg swelling. What changed:  medication strength  how much to take  how to take this  when to take this  reasons to take this   ipratropium-albuterol 0.5-2.5 (3) MG/3ML Soln Commonly known as:  DUONEB Take 3 mLs by nebulization every 6 (six) hours as needed.   levothyroxine 50 MCG tablet Commonly known as:  SYNTHROID, LEVOTHROID Take 1 tablet daily or as directed What changed:  additional instructions   losartan 100 MG tablet Commonly known as:  COZAAR Take 0.5 tablets (50 mg total) by mouth daily. What changed:  See the new instructions.  Magnesium 500 MG Tabs Take 0.5 tablets by mouth daily.   omeprazole 40 MG capsule Commonly known as:  PRILOSEC TAKE ONE (1) CAPSULE EACH DAY   predniSONE 10 MG tablet Commonly known as:  DELTASONE Take 3m po daily on day 1 and decrease by 145mdaily until complete   vitamin C 500 MG tablet Commonly known as:  ASCORBIC ACID Take 500 mg by mouth daily.      Follow-up Information    [facility], [facility]. Schedule an appointment as soon as possible for a visit in 4 week(s).   Specialty:   Nephrology Contact information: [address] [phone]      MCKEOWN,WILLIAM DAVID, MD. Schedule an appointment as soon as possible for a visit in 2 week(s).   Specialty:  Internal Medicine Contact information: [id number] [phone]        Allergies  Allergen Reactions  . Sulfa Antibiotics Palpitations    Unknown    Procedures/Studies: Dg Chest 2 View  Result Date: 04/19/2016 CLINICAL DATA:  Started feeling bad with sob since yesterday , her husband was dx with pneu yesterday, She is coming from dr office with sats of 84 % RA NOW SHE HAS SATS OF 78. PT PLACED ON nrb 100% AND EKG DONE , NO CP at this time having back and shoulder pain EXAM: CHEST  2 VIEW COMPARISON:  06/06/2014 FINDINGS: There are moderate bilateral pleural effusions, larger on the right. Lungs are hyperexpanded. There is patchy airspace as well as interstitial type opacities in the right middle lobe and right lower lobe increased when compared to the prior exam. This is consistent with the given history of pneumonia. There is no convincing pulmonary edema. Cardiac silhouette is normal in size and configuration. No mediastinal or hilar masses or convincing adenopathy. No pneumothorax. Bony thorax is demineralized but grossly intact. IMPRESSION: 1. Patchy airspace interstitial opacities at the right lung base consistent with pneumonia superimposed on COPD. 2. Moderate pleural effusions, right greater than left. No convincing pulmonary edema. Electronically Signed   By: DaLajean Manes.D.   On: 04/19/2016 17:22  [place]  Result Date: 04/23/2016 CLINICAL DATA:  Acute renal injury EXAM: RENAL / URINARY TRACT ULTRASOUND COMPLETE COMPARISON:  Abdominal pelvic CT scan dated August 15, 2007 FINDINGS: Right Kidney: Length: 8.6 cm. The right kidney lies at the level of the pelvic brim. The renal cortical echotexture is normal. There is no  hydronephrosis. Left Kidney: Length: 8.8 cm. The renal cortical echotexture is normal similar to that on the right. There is no hydronephrosis. A previously demonstrated sub cm cortical cyst in the midpole is not evident on today's study. Bladder: The partially distended urinary bladder is normal in appearance. IMPRESSION: The kidneys are somewhat small but this is not greatly changed from the CT scan of November 2008. The renal cortical echotexture remains lower than that of the liver. There is no hydronephrosis. The urinary bladder is unremarkable. Electronically Signed   By: David  JoMartinique.D.   On: 04/23/2016 10:00  Subjective: Feels weak, but otherwise okay. No cough or wheeze.   Discharge Exam: Vitals:   04/24/16 2157 04/25/16 0723  BP: (!) 139/57 (!) 122/56  Pulse: 76 77  Resp: 20 20  Temp: 98 F (36.7 C) 98 F (36.7 C)   Vitals:   04/24/16 1936 04/24/16 2157 04/25/16 0723 04/25/16 0731  BP:  (!) 139/57 (!) 122/56   Pulse:  76 77   Resp:  20 20   Temp:  98 F (36.7 C) 98 F (36.7 C)   TempSrc:  Oral Oral   SpO2: 95% 94% 95% 93%  Weight:      Height:       Examination:  General exam: Appears calm and comfortable  Respiratory system: Diminished breath sounds bilaterally. Respiratory effort normal. Cardiovascular system: S1 & S2 heard, RRR. No JVD, murmurs, rubs, gallops or clicks. No pedal edema. Gastrointestinal system: Abdomen is nondistended, soft and nontender. No organomegaly or masses felt. Normal bowel sounds heard. Central nervous system: Alert and oriented. No focal neurological deficits. Extremities: Symmetric 5 x 5 power. Skin: No rashes, lesions or ulcers Psychiatry: Judgement and insight appear normal. Mood & affect appropriate.   The results of significant diagnostics from this hospitalization (including imaging, microbiology, ancillary and laboratory) are listed below for reference.     Microbiology: Recent Results (from the past 240 hour(s))  Culture,  blood (routine x 2) Call MD if unable to obtain prior to antibiotics being given     Status: None   Collection Time: 04/19/16 11:35 PM  Result Value Ref Range Status   Specimen Description BLOOD RIGHT ANTECUBITAL  Final   Special Requests   Final    BOTTLES DRAWN AEROBIC AND ANAEROBIC AEB=12CC ANA=6CC   Culture NO GROWTH 5 DAYS  Final   Report Status 04/25/2016 FINAL  Final  Culture, blood (routine x 2) Call MD if unable to obtain prior to antibiotics being given     Status: None   Collection Time: 04/19/16 11:44 PM  Result Value Ref Range Status   Specimen Description BLOOD LEFT ANTECUBITAL  Final   Special Requests   Final    BOTTLES DRAWN AEROBIC AND ANAEROBIC AEB=12CC ANA=8CC   Culture NO GROWTH 5 DAYS  Final   Report Status 04/25/2016 FINAL  Final     Labs: BNP (last 3 results)  Recent Labs  04/21/16 0551  BNP 527.7*   Basic Metabolic Panel:  Recent Labs Lab 04/21/16 0551 04/22/16 0541 04/23/16 0451 04/24/16 0615 04/25/16 0602  NA 130* 129* 129* 134* 138  K 4.5 4.5 4.4 4.3 4.2  CL 91* 91* 97* 99* 101  CO2 _0 GLUCOSE 155* 141* 134* 121* 108*  BUN 49* 62* 79* 72* 64*  CREATININE 1.89* 2.81* 2.79* 2.13* 1.63*  CALCIUM 8.1* 8.6* 8.2* 8.3* 8.7*  PHOS  --   --   --  3.6 3.7   Liver Function Tests:  Recent Labs Lab 04/19/16 1607 04/20/16 0526 04/24/16 0615 04/25/16 0602  AST 24 16  --   --   ALT 12* 10*  --   --   ALKPHOS 72 52  --   --   BILITOT 1.2 0.7  --   --   PROT 7.7 5.9*  --   --   ALBUMIN 3.5 2.7* 2.5* 2.6*   CBC:  Recent Labs Lab 04/19/16 1607 04/20/16 0526 04/21/16 0551  WBC 20.8* 12.8* 12.3*  NEUTROABS 18.8* 11.1*  --   HGB 12.9 11.2* 11.4*  HCT 39.5 34.8* 34.6*  MCV 93.2 94.1 93.8  PLT 225 191 227   Cardiac Enzymes:  Recent Labs Lab 04/19/16 2032  TROPONINI <0.03   Urinalysis    Component Value Date/Time   COLORURINE YELLOW 09/25/2015 1547   APPEARANCEUR CLEAR 09/25/2015 1547   LABSPEC 1.008 09/25/2015 1547    PHURINE 7.0 09/25/2015 1547   GLUCOSEU NEGATIVE 09/25/2015 1547  HGBUR NEGATIVE 09/25/2015 1547   BILIRUBINUR NEGATIVE 09/25/2015 1547   KETONESUR NEGATIVE 09/25/2015 1547   PROTEINUR NEGATIVE 09/25/2015 1547   UROBILINOGEN 0.2 08/20/2014 1613   NITRITE NEGATIVE 09/25/2015 1547   LEUKOCYTESUR 1+ (A) 09/25/2015 1547   Sepsis Labs Invalid input(s): PROCALCITONIN,  WBC,  LACTICIDVEN Microbiology Recent Results (from the past 240 hour(s))  Culture, blood (routine x 2) Call MD if unable to obtain prior to antibiotics being given     Status: None   Collection Time: 04/19/16 11:35 PM  Result Value Ref Range Status   Specimen Description BLOOD RIGHT ANTECUBITAL  Final   Special Requests   Final    BOTTLES DRAWN AEROBIC AND ANAEROBIC AEB=12CC ANA=6CC   Culture NO GROWTH 5 DAYS  Final   Report Status 04/25/2016 FINAL  Final  Culture, blood (routine x 2) Call MD if unable to obtain prior to antibiotics being given     Status: None   Collection Time: 04/19/16 11:44 PM  Result Value Ref Range Status   Specimen Description BLOOD LEFT ANTECUBITAL  Final   Special Requests   Final    BOTTLES DRAWN AEROBIC AND ANAEROBIC AEB=12CC ANA=8CC   Culture NO GROWTH 5 DAYS  Final   Report Status 04/25/2016 FINAL  Final     Time coordinating discharge: Over 30 minutes  SIGNED:  Kathie Dike,  MD  [facility] 04/25/2016, 1:29 PM Pager   If 7PM-7AM, please contact night-coverage [email or web address] Password [email or web address]  By signing my name below, I, Delene Ruffini, attest that this documentation has been prepared under the direction and in the presence of Kathie Dike, MD. Electronically Signed: Delene Ruffini 04/25/16 12:00pm  I, Dr. Kathie Dike, personally performed the services described in this documentaiton. All medical record entries made by the scribe were at my direction and in my presence. I have reviewed the chart and agree that the record reflects my personal performance and is  accurate and complete  Kathie Dike, MD, 04/25/2016 1:29 PM

## 2016-04-25 NOTE — Progress Notes (Signed)
Discharged PT per MD order and protocol. Discharge handouts reviewed/explained. Education completed.  Prescriptions were given and explained.  Pt verbalized understanding and left with all belongings. VSS. IV catheter D/C.  Patient wheeled down by staff member.

## 2016-04-25 NOTE — Progress Notes (Signed)
Subjective: Interval History: Patient continue to feel better. Currently she offers no complaints..  Objective: Vital signs in last 24 hours: Temp:  [98 F (36.7 C)-98.2 F (36.8 C)] 98 F (36.7 C) (07/30 0723) Pulse Rate:  [69-77] 77 (07/30 0723) Resp:  [20] 20 (07/30 0723) BP: (122-142)/(56-57) 122/56 (07/30 0723) SpO2:  [74 %-95 %] 93 % (07/30 0731) Weight change:   Intake/Output from previous day: 07/29 0701 - 07/30 0700 In: 1436.3 [P.O.:720; I.V.:666.3; IV Piggyback:50] Out: 3150 [Urine:3150] Intake/Output this shift: Total I/O In: 37 [P.O.:37] Out: -   General appearance: alert, cooperative and no distress Resp: diminished breath sounds bilaterally and wheezes bilaterally Cardio: regular rate and rhythm Extremities: No edema  Lab Results: No results for input(s): WBC, HGB, HCT, PLT in the last 72 hours. BMET:   Recent Labs  04/24/16 0615 04/25/16 0602  NA 134* 138  K 4.3 4.2  CL 99* 101  CO2 28 30  GLUCOSE 121* 108*  BUN 72* 64*  CREATININE 2.13* 1.63*  CALCIUM 8.3* 8.7*   No results for input(s): PTH in the last 72 hours. Iron Studies: No results for input(s): IRON, TIBC, TRANSFERRIN, FERRITIN in the last 72 hours.  Studies/Results: No results found.  I have reviewed the patient's current medications.  Assessment/Plan: Problem #1 acute kidney Injury superimposed on chronic. Her renal function continued to improve and patient is asymptomatic.. Problem #2 chronic renal failure: Possibly chronic. Right kidney 8.6 and left kidney 8.8. She has stage III chronic renal failure. Possibly a combination of hypertension/cardiorenal. Problem #3 history of CHF: Presently on diuretics. She has 3100 mL of urine output the last 24 hours. She is asymptomatic.  Problem #4 anemia Problem #5 hyponatremia: Her sodium has corrected. Problem #6 hypertension: Her blood pressure is reasonably controlled Problem #7 hypothyroidism Problem #8 history of COPD: She is on  inhaler. She has some wheezing but denies any difficulty breathing. Problem #9 metabolic no disease: Her calcium and phosphorus is range Plan: DC IV fluid 2] we'll put her back on Cozaar and Lasix 20 mg on when necessary basis. 3] I will follow her see her in 4 weeks after she is discharged.  LOS: 6 days   Karolee Meloni S 04/25/2016,9:52 AM

## 2016-04-26 NOTE — Care Management (Signed)
Pt discharged on 04/25/2016. Orders placed for neb machine and HH RN/PT. Pt contacted via phone and prefers to use [facility] for [facility] services and [employer] for DME. Pt aware that [facility] has 48 hours to initiate services. Melissa, From [facility], made aware of referral and will obtain pt info from chart. Velva Harman, of [employer], faxed pt info and will contact pt's daughter with price of machine. Family will pick up in store.

## 2016-04-26 NOTE — Care Management (Signed)
Patient Information   Patient Name Tangela, Dolliver ([id number]) Sex Female DOB 1929-05-26  Room Bed  A307 A307-01  Patient Demographics   Address [address] [place] [facility] [address] Phone [phone] (Home) *Preferred* E-mail Address [email or web address]  Patient Ethnicity & Race   Ethnic Group Patient Race  Not Hispanic or Latino White or Caucasian  Emergency Contact(s)   Name Relation Home Work Mobile  Teandra, Harlan [phone]  [phone]  Nance,Lori Daughter [phone]  [phone]  Documents on File    Status Date Received Description  Documents for the Patient  EMR Medication Summary Not Received    EMR Problem Summary Not Received    EMR Patient Summary Not Received    [place] Not Received    Elizabeth E-Signature HIPAA Notice of Privacy Received 01/19/11   [facility] E-Signature HIPAA Notice of Privacy Spanish Received 55/73/22   Driver's License Not Received    Advance Directives/Living Will/HCPOA/POA Not Received    Insurance Card Not Received  NEW CARD  Insurance Card Not Received    Editor, commissioning Not Received    Insurance Card Not Received    Insurance Card Received 07/03/12 [facility]  Insurance Card Not Received    Insurance Card Received 08/10/13   [place]ita Fire HIPAA Received 08/10/13   Financial Correspondence Not Received    Driver's License Received 02/54/27   Insurance Card Not Received    Insurance Card Received 11/19/13   AMB HH/NH/Hospice Not Received  01/15 Orders Adv HC  AMB HH/NH/Hospice Not Received  01/15 ORDER Adv [facility]  Release of Information Received 11/01/13 HIPAA/GAAIM/11/01/13  Release of Information Received 09/17/14 [employer] Card Received 09/17/14 Medicare Complete 2015  Release of Information Received 12/18/13 dpr lbpulm  Insurance Card Received 09/17/14 INSURANCE CARD  HIM ROI Authorization  07/04/14 ECS representing United[employer]Care/ [place]  Authorization  07/31/14   Release of Information  10/02/14   Insurance Card Received 11/06/14   Insurance Card Received 11/07/14 AARP MCR  AMB Correspondence  12/13/14 OFFICE NOTE [facility]  HIM ROI Authorization (Expired) 04/10/15 Authorization for batch [employer]/United HealthCare    ltd  04/10/2015.6  Other Photo ID Not Received    Insurance Card   aarp  Insurance Card Received 10/08/15 GAAIM= [place] Card Received 12/09/15 [place] 2017  Insurance Card Received 01/12/16 GAAIM=AARP MCR 2017  HIM ROI Authorization (Expired) 02/06/16 Authorization for batch CIOX/UnitedHealthCare  fbg  02/05/16  Documents for the Encounter  AOB (Assignment of Insurance Benefits) Not Received    E-signature AOB Signed 04/19/16   MEDICARE RIGHTS Not Received    E-signature Medicare Rights Signed 04/19/16   ED Patient Billing Extract   ED PB Summary  ED Patient Billing Extract   ED Encounter Summary  Cardiac Monitoring Strip  04/19/16   Ultrasound  04/23/16   ED Patient Billing Extract   ED PB Summary  After Visit Summary   IP After Visit Summary  EKG  04/20/16   Admission Information   Attending Provider Admitting Provider Admission Type Admission Date/Time   Jani Gravel, MD Emergency 04/19/16 2008  Discharge Date Hospital Service Auth/Cert Status Service Area  04/25/16 Internal Medicine Incomplete [place]  Unit Room/Bed Admission Status   AP-DEPT 300 A307/A307-01 Discharged (Confirmed)   Admission   Complaint  hypoxia  Hospital Account   Name Acct ID Class Status Primary Coverage  Brandey, Vandalen [id number] Inpatient Discharged/Not Billed  [place]      Guarantor Account (for Hospital Account [id number])   Name Relation to Pt Service Area Active? Acct Type  Nelle Don Self CHSA Yes Personal/Family  Address Phone    [place], [place] [address] [phone])        Coverage  Information (for Hospital Account [id number])   F/O Payor/Plan Precert #  [facility] Lydia #  [place], [place] [id number]  [place]  [address] [place] [address] [phone]

## 2016-04-27 ENCOUNTER — Other Ambulatory Visit: Payer: Self-pay | Admitting: Internal Medicine

## 2016-04-27 DIAGNOSIS — Z9981 Dependence on supplemental oxygen: Secondary | ICD-10-CM | POA: Diagnosis not present

## 2016-04-27 DIAGNOSIS — J9621 Acute and chronic respiratory failure with hypoxia: Secondary | ICD-10-CM | POA: Diagnosis not present

## 2016-04-27 DIAGNOSIS — E039 Hypothyroidism, unspecified: Secondary | ICD-10-CM | POA: Diagnosis not present

## 2016-04-27 DIAGNOSIS — J189 Pneumonia, unspecified organism: Secondary | ICD-10-CM | POA: Diagnosis not present

## 2016-04-27 DIAGNOSIS — J90 Pleural effusion, not elsewhere classified: Secondary | ICD-10-CM | POA: Diagnosis not present

## 2016-04-27 DIAGNOSIS — I110 Hypertensive heart disease with heart failure: Secondary | ICD-10-CM | POA: Diagnosis not present

## 2016-04-27 DIAGNOSIS — E43 Unspecified severe protein-calorie malnutrition: Secondary | ICD-10-CM | POA: Diagnosis not present

## 2016-04-27 DIAGNOSIS — I5032 Chronic diastolic (congestive) heart failure: Secondary | ICD-10-CM | POA: Diagnosis not present

## 2016-04-27 DIAGNOSIS — Z7951 Long term (current) use of inhaled steroids: Secondary | ICD-10-CM | POA: Diagnosis not present

## 2016-04-27 DIAGNOSIS — J441 Chronic obstructive pulmonary disease with (acute) exacerbation: Secondary | ICD-10-CM | POA: Diagnosis not present

## 2016-04-27 DIAGNOSIS — J440 Chronic obstructive pulmonary disease with acute lower respiratory infection: Secondary | ICD-10-CM | POA: Diagnosis not present

## 2016-04-27 LAB — ANTINUCLEAR ANTIBODIES, IFA: ANA Ab, IFA: POSITIVE — AB

## 2016-04-27 LAB — FANA STAINING PATTERNS: Homogeneous Pattern: 1:320 {titer} — ABNORMAL HIGH

## 2016-04-28 DIAGNOSIS — I110 Hypertensive heart disease with heart failure: Secondary | ICD-10-CM | POA: Diagnosis not present

## 2016-04-28 DIAGNOSIS — J441 Chronic obstructive pulmonary disease with (acute) exacerbation: Secondary | ICD-10-CM | POA: Diagnosis not present

## 2016-04-28 DIAGNOSIS — J9621 Acute and chronic respiratory failure with hypoxia: Secondary | ICD-10-CM | POA: Diagnosis not present

## 2016-04-28 DIAGNOSIS — Z7951 Long term (current) use of inhaled steroids: Secondary | ICD-10-CM | POA: Diagnosis not present

## 2016-04-28 DIAGNOSIS — E43 Unspecified severe protein-calorie malnutrition: Secondary | ICD-10-CM | POA: Diagnosis not present

## 2016-04-28 DIAGNOSIS — J189 Pneumonia, unspecified organism: Secondary | ICD-10-CM | POA: Diagnosis not present

## 2016-04-28 DIAGNOSIS — I5032 Chronic diastolic (congestive) heart failure: Secondary | ICD-10-CM | POA: Diagnosis not present

## 2016-04-28 DIAGNOSIS — E039 Hypothyroidism, unspecified: Secondary | ICD-10-CM | POA: Diagnosis not present

## 2016-04-28 DIAGNOSIS — J90 Pleural effusion, not elsewhere classified: Secondary | ICD-10-CM | POA: Diagnosis not present

## 2016-04-28 DIAGNOSIS — Z9981 Dependence on supplemental oxygen: Secondary | ICD-10-CM | POA: Diagnosis not present

## 2016-04-28 DIAGNOSIS — J440 Chronic obstructive pulmonary disease with acute lower respiratory infection: Secondary | ICD-10-CM | POA: Diagnosis not present

## 2016-04-28 DIAGNOSIS — J439 Emphysema, unspecified: Secondary | ICD-10-CM | POA: Diagnosis not present

## 2016-04-29 DIAGNOSIS — Z9981 Dependence on supplemental oxygen: Secondary | ICD-10-CM | POA: Diagnosis not present

## 2016-04-29 DIAGNOSIS — J9621 Acute and chronic respiratory failure with hypoxia: Secondary | ICD-10-CM | POA: Diagnosis not present

## 2016-04-29 DIAGNOSIS — E039 Hypothyroidism, unspecified: Secondary | ICD-10-CM | POA: Diagnosis not present

## 2016-04-29 DIAGNOSIS — I110 Hypertensive heart disease with heart failure: Secondary | ICD-10-CM | POA: Diagnosis not present

## 2016-04-29 DIAGNOSIS — J189 Pneumonia, unspecified organism: Secondary | ICD-10-CM | POA: Diagnosis not present

## 2016-04-29 DIAGNOSIS — I5032 Chronic diastolic (congestive) heart failure: Secondary | ICD-10-CM | POA: Diagnosis not present

## 2016-04-29 DIAGNOSIS — E43 Unspecified severe protein-calorie malnutrition: Secondary | ICD-10-CM | POA: Diagnosis not present

## 2016-04-29 DIAGNOSIS — J90 Pleural effusion, not elsewhere classified: Secondary | ICD-10-CM | POA: Diagnosis not present

## 2016-04-29 DIAGNOSIS — J440 Chronic obstructive pulmonary disease with acute lower respiratory infection: Secondary | ICD-10-CM | POA: Diagnosis not present

## 2016-04-29 DIAGNOSIS — Z7951 Long term (current) use of inhaled steroids: Secondary | ICD-10-CM | POA: Diagnosis not present

## 2016-04-29 DIAGNOSIS — J441 Chronic obstructive pulmonary disease with (acute) exacerbation: Secondary | ICD-10-CM | POA: Diagnosis not present

## 2016-04-30 DIAGNOSIS — I110 Hypertensive heart disease with heart failure: Secondary | ICD-10-CM | POA: Diagnosis not present

## 2016-04-30 DIAGNOSIS — J9621 Acute and chronic respiratory failure with hypoxia: Secondary | ICD-10-CM | POA: Diagnosis not present

## 2016-04-30 DIAGNOSIS — J90 Pleural effusion, not elsewhere classified: Secondary | ICD-10-CM | POA: Diagnosis not present

## 2016-04-30 DIAGNOSIS — E039 Hypothyroidism, unspecified: Secondary | ICD-10-CM | POA: Diagnosis not present

## 2016-04-30 DIAGNOSIS — E43 Unspecified severe protein-calorie malnutrition: Secondary | ICD-10-CM | POA: Diagnosis not present

## 2016-04-30 DIAGNOSIS — J189 Pneumonia, unspecified organism: Secondary | ICD-10-CM | POA: Diagnosis not present

## 2016-04-30 DIAGNOSIS — I5032 Chronic diastolic (congestive) heart failure: Secondary | ICD-10-CM | POA: Diagnosis not present

## 2016-04-30 DIAGNOSIS — Z9981 Dependence on supplemental oxygen: Secondary | ICD-10-CM | POA: Diagnosis not present

## 2016-04-30 DIAGNOSIS — Z7951 Long term (current) use of inhaled steroids: Secondary | ICD-10-CM | POA: Diagnosis not present

## 2016-04-30 DIAGNOSIS — J441 Chronic obstructive pulmonary disease with (acute) exacerbation: Secondary | ICD-10-CM | POA: Diagnosis not present

## 2016-04-30 DIAGNOSIS — J440 Chronic obstructive pulmonary disease with acute lower respiratory infection: Secondary | ICD-10-CM | POA: Diagnosis not present

## 2016-05-03 DIAGNOSIS — I110 Hypertensive heart disease with heart failure: Secondary | ICD-10-CM | POA: Diagnosis not present

## 2016-05-03 DIAGNOSIS — Z7951 Long term (current) use of inhaled steroids: Secondary | ICD-10-CM | POA: Diagnosis not present

## 2016-05-03 DIAGNOSIS — Z9981 Dependence on supplemental oxygen: Secondary | ICD-10-CM | POA: Diagnosis not present

## 2016-05-03 DIAGNOSIS — E43 Unspecified severe protein-calorie malnutrition: Secondary | ICD-10-CM | POA: Diagnosis not present

## 2016-05-03 DIAGNOSIS — E039 Hypothyroidism, unspecified: Secondary | ICD-10-CM | POA: Diagnosis not present

## 2016-05-03 DIAGNOSIS — J440 Chronic obstructive pulmonary disease with acute lower respiratory infection: Secondary | ICD-10-CM | POA: Diagnosis not present

## 2016-05-03 DIAGNOSIS — I5032 Chronic diastolic (congestive) heart failure: Secondary | ICD-10-CM | POA: Diagnosis not present

## 2016-05-03 DIAGNOSIS — J441 Chronic obstructive pulmonary disease with (acute) exacerbation: Secondary | ICD-10-CM | POA: Diagnosis not present

## 2016-05-03 DIAGNOSIS — J189 Pneumonia, unspecified organism: Secondary | ICD-10-CM | POA: Diagnosis not present

## 2016-05-03 DIAGNOSIS — J90 Pleural effusion, not elsewhere classified: Secondary | ICD-10-CM | POA: Diagnosis not present

## 2016-05-03 DIAGNOSIS — J9621 Acute and chronic respiratory failure with hypoxia: Secondary | ICD-10-CM | POA: Diagnosis not present

## 2016-05-04 DIAGNOSIS — Z9981 Dependence on supplemental oxygen: Secondary | ICD-10-CM | POA: Diagnosis not present

## 2016-05-04 DIAGNOSIS — J441 Chronic obstructive pulmonary disease with (acute) exacerbation: Secondary | ICD-10-CM | POA: Diagnosis not present

## 2016-05-04 DIAGNOSIS — E43 Unspecified severe protein-calorie malnutrition: Secondary | ICD-10-CM | POA: Diagnosis not present

## 2016-05-04 DIAGNOSIS — J189 Pneumonia, unspecified organism: Secondary | ICD-10-CM | POA: Diagnosis not present

## 2016-05-04 DIAGNOSIS — J440 Chronic obstructive pulmonary disease with acute lower respiratory infection: Secondary | ICD-10-CM | POA: Diagnosis not present

## 2016-05-04 DIAGNOSIS — E039 Hypothyroidism, unspecified: Secondary | ICD-10-CM | POA: Diagnosis not present

## 2016-05-04 DIAGNOSIS — J9621 Acute and chronic respiratory failure with hypoxia: Secondary | ICD-10-CM | POA: Diagnosis not present

## 2016-05-04 DIAGNOSIS — J90 Pleural effusion, not elsewhere classified: Secondary | ICD-10-CM | POA: Diagnosis not present

## 2016-05-04 DIAGNOSIS — Z7951 Long term (current) use of inhaled steroids: Secondary | ICD-10-CM | POA: Diagnosis not present

## 2016-05-04 DIAGNOSIS — I5032 Chronic diastolic (congestive) heart failure: Secondary | ICD-10-CM | POA: Diagnosis not present

## 2016-05-04 DIAGNOSIS — I110 Hypertensive heart disease with heart failure: Secondary | ICD-10-CM | POA: Diagnosis not present

## 2016-05-05 ENCOUNTER — Encounter: Payer: Self-pay | Admitting: Internal Medicine

## 2016-05-05 ENCOUNTER — Ambulatory Visit (INDEPENDENT_AMBULATORY_CARE_PROVIDER_SITE_OTHER): Payer: Medicare Other | Admitting: Internal Medicine

## 2016-05-05 VITALS — BP 160/76 | HR 88 | Temp 97.6°F | Resp 16 | Ht 63.0 in | Wt 100.6 lb

## 2016-05-05 DIAGNOSIS — J441 Chronic obstructive pulmonary disease with (acute) exacerbation: Secondary | ICD-10-CM

## 2016-05-05 DIAGNOSIS — I10 Essential (primary) hypertension: Secondary | ICD-10-CM

## 2016-05-05 DIAGNOSIS — Z79899 Other long term (current) drug therapy: Secondary | ICD-10-CM

## 2016-05-05 DIAGNOSIS — N179 Acute kidney failure, unspecified: Secondary | ICD-10-CM | POA: Diagnosis not present

## 2016-05-05 DIAGNOSIS — J189 Pneumonia, unspecified organism: Secondary | ICD-10-CM | POA: Diagnosis not present

## 2016-05-05 DIAGNOSIS — J449 Chronic obstructive pulmonary disease, unspecified: Secondary | ICD-10-CM | POA: Diagnosis not present

## 2016-05-05 LAB — CBC WITH DIFFERENTIAL/PLATELET
BASOS ABS: 0 {cells}/uL (ref 0–200)
Basophils Relative: 0 %
EOS ABS: 87 {cells}/uL (ref 15–500)
Eosinophils Relative: 1 %
HEMATOCRIT: 33.6 % — AB (ref 35.0–45.0)
Hemoglobin: 10.6 g/dL — ABNORMAL LOW (ref 11.7–15.5)
LYMPHS PCT: 12 %
Lymphs Abs: 1044 cells/uL (ref 850–3900)
MCH: 29 pg (ref 27.0–33.0)
MCHC: 31.5 g/dL — ABNORMAL LOW (ref 32.0–36.0)
MCV: 92.1 fL (ref 80.0–100.0)
MONO ABS: 1044 {cells}/uL — AB (ref 200–950)
MONOS PCT: 12 %
MPV: 11.7 fL (ref 7.5–12.5)
NEUTROS PCT: 75 %
Neutro Abs: 6525 cells/uL (ref 1500–7800)
PLATELETS: 221 10*3/uL (ref 140–400)
RBC: 3.65 MIL/uL — ABNORMAL LOW (ref 3.80–5.10)
RDW: 13.3 % (ref 11.0–15.0)
WBC: 8.7 10*3/uL (ref 3.8–10.8)

## 2016-05-05 MED ORDER — ATENOLOL 25 MG PO TABS: ORAL_TABLET | ORAL | Status: DC

## 2016-05-05 NOTE — Progress Notes (Signed)
Patient ID: Andrea [name], female   DOB: 06/25/1929, 82 y.o.   MRN: [id number] [facility] ADULT & ADOLESCENT INTERNAL MEDICINE                       Unk Pinto, M.D.        Uvaldo Bristle. Silverio Lay, P.A.-C       Starlyn Skeans, P.A.-C   [facility]                [address] [place] [address] Telephone [phone] Telefax [phone] ____________________________________________________     This very nice 82 y.o. WWF is brought in by her daughter and presents for 80 day f/u from the hospital as she was incarcerated 7/24-30 with an O2 sat of 74% & CAP and with recovery was discharged home on nasal o2 and she reports O2 sats on Rm Air drop to the low 80's and on O2 at 2 L/M  Her O2's remain at 96-98%. She denies any cough, chest congestion or sputum pdn.  During hospitalization he renal functions deteriorated with Creatinine rising from 1.13 to 2.81 as GFR dropped from 50 to 17 and Vancomycin was d/c'das losartan & Lasix were d/c'd and she was 'gently " rehydrated with IVF and creatinine dropped to 1.63 and GFR recovered to 32 ml/min.      Patient has longstanding  HTN predating to the 1990's  & BP has been controlled til this recent illness and todayls BP is elevated at 160/76 as she has been inadvertantly off of her Amlodipine. Patient has had no complaints of any cardiac type chest pain, palpitations, orthopnea/PND, dizziness, claudication but does have dependent edema.     Hyperlipidemia is controlled with diet & patient is noted Statin Intolerant. . Patient denies myalgias or other med SE's. Last Lipids were  Lab Results  Component Value Date   CHOL 200 04/07/2016   HDL 97 04/07/2016   LDLCALC 89 04/07/2016   TRIG 72 04/07/2016   CHOLHDL 2.1 04/07/2016      Also, the patient has history of PreDiabetes (2012)  and has had no symptoms of reactive hypoglycemia, diabetic polys, paresthesias or visual blurring.  Last A1c was  Lab  Results  Component Value Date   HGBA1C 5.9 (H) 04/07/2016      Patient has been on Thyroid Replacement (2014). Further, the patient also has history of Vitamin D Deficiency and supplements vitamin D without any suspected side-effects. Last vitamin D was   Lab Results  Component Value Date   VD25OH 43 12/30/2015   Current Outpatient Prescriptions on File Prior to Visit  Medication Sig  . ALPRAZolam (XANAX) 0.5 MG tablet Take 1/2 to 1 tablet 3 x day if needed for nerves  . Ascorbic Acid (VITAMIN C) 500 MG tablet Take 500 mg by mouth daily.    . cholecalciferol (VITAMIN D) 1000 UNITS tablet Take 1,000 Units by mouth daily.   . furosemide (LASIX) 20 MG tablet Take 1 tablet (20 mg total) by mouth daily as needed for fluid. Take 1 tablet by mouth daily with breakfast for leg swelling.  [name] ipratropium-albuterol (DUONEB) 0.5-2.5 (3) MG/3ML SOLN Take 3 mLs by nebulization every 6 (six) hours as needed.  [name] levothyroxine (SYNTHROID, LEVOTHROID) 50 MCG tablet Take 1 tablet daily or as directed (Patient taking differently: ONE-HALF TABLET Friday AND SUNDAY)  . losartan (COZAAR) 100 MG  tablet Take 0.5 tablets (50 mg total) by mouth daily.  . Magnesium 500 MG TABS Take 0.5 tablets by mouth daily.   [name] omeprazole (PRILOSEC) 40 MG capsule TAKE ONE (1) CAPSULE EACH DAY  . amLODipine (NORVASC) 10 MG tablet Take 0.5 tablets (5 mg total) by mouth daily. (Patient not taking: Reported on 05/05/2016)   No current facility-administered medications on file prior to visit.    Allergies  Allergen Reactions  . Sulfa Antibiotics Palpitations    Unknown   PMHx:   Past Medical History:  Diagnosis Date  . Aortic regurgitation   . Bronchiectasis ([place])   . COPD (chronic obstructive pulmonary disease) ([place])   . DJD (degenerative joint disease)   . HLD (hyperlipidemia)   . HTN (hypertension)   . Mild mitral regurgitation by prior echocardiogram   . Mild tricuspid regurgitation   . Palpitations   . Thyroid disease     Immunization History  Administered Date(s) Administered  . Influenza Split 07/16/2013  . Influenza, High Dose Seasonal PF 06/25/2015  . Influenza,inj,Quad PF,36+ Mos 07/04/2014  . Pneumococcal Conjugate-13 02/26/2014  . Td 06/26/2013   Past Surgical History:  Procedure Laterality Date  . ABDOMINAL HYSTERECTOMY  1993  . CATARACT EXTRACTION, BILATERAL     FHx:    Reviewed / unchanged  SHx:    Reviewed / unchanged  Systems Review:  Constitutional: Denies fever, chills, wt changes, headaches, insomnia, fatigue, night sweats, change in appetite. Eyes: Denies redness, blurred vision, diplopia, discharge, itchy, watery eyes.  ENT: Denies discharge, congestion, post nasal drip, epistaxis, sore throat, earache, hearing loss, dental pain, tinnitus, vertigo, sinus pain, snoring.  CV: Denies chest pain, palpitations, irregular heartbeat, syncope, dyspnea, diaphoresis, orthopnea, PND, claudication or edema. Respiratory: denies cough, dyspnea, DOE, pleurisy, hoarseness, laryngitis, wheezing.  Gastrointestinal: Denies dysphagia, odynophagia, heartburn, reflux, water brash, abdominal pain or cramps, nausea, vomiting, bloating, diarrhea, constipation, hematemesis, melena, hematochezia  or hemorrhoids. Genitourinary: Denies dysuria, frequency, urgency, nocturia, hesitancy, discharge, hematuria or flank pain. Musculoskeletal: Denies arthralgias, myalgias, stiffness, jt. swelling, pain, limping or strain/sprain.  Skin: Denies pruritus, rash, hives, warts, acne, eczema or change in skin lesion(s). Neuro: No weakness, tremor, incoordination, spasms, paresthesia or pain. Psychiatric: Denies confusion, memory loss or sensory loss. Endo: Denies change in weight, skin or hair change.  Heme/Lymph: No excessive bleeding, bruising or enlarged lymph nodes.  Physical Exam  BP (!) 160/76   Pulse 88   Temp 97.6 F (36.4 C)   Resp 16   Ht _0  (1.6 m)   Wt 100 lb 9.6 oz (45.6 kg)   BMI 17.82 kg/m    Appears chronically ill on nasal O2 with sats 96% at rest on 2 L/M O2. and in no distress.  Eyes: PERRLA, EOMs, conjunctiva no swelling or erythema.  ENT/Mouth: EAC's clear, TM's nl w/o erythema, bulging. Nares clear w/o erythema, swelling, exudates. Oropharynx clear without erythema or exudates. Oral hygiene is good. Tongue normal, non obstructing. Hearing intact.  Neck: Supple. Thyroid nl. Car 2+/2+ without bruits, nodes or JVD. Chest: Moderate kyphosis.  Respirations nl with BS very distant w/o rales, rhonchi, wheezing or stridor.  Cor: Heart soft w/ regular rate and rhythm without sig. murmurs, gallops, clicks, or rubs. Peripheral pulses 0-1+  and equal  With 1-2+ ankle /pretibial edema.  Abdomen: Soft & bowel sounds normal. Non-tender w/o guarding, rebound, hernias, masses, or organomegaly.  Lymphatics: Unremarkable.  Musculoskeletal: Full ROM all peripheral extremities with generalized decrease in Muscle Power , Tone & Bulk.  Normal gait.  Skin: Warm, dry without exposed rashes, lesions or ecchymosis apparent.  Neuro: Cranial nerves intact, reflexes equal bilaterally. Sensory-motor testing grossly intact. Tendon reflexes grossly intact.  Pysch: Alert & oriented x 3.  Insight and judgement nl & appropriate. No ideations.  Assessment and Plan:   1. CAP (community acquired pneumonia), recovered  - Needs CXR  At 4 weeks  F/u  2. Acute renal failure, unspecified acute renal failure type [facility])  - Patient has Nephrology f/u scheduled in 2 weeks per pt's daughter - Continue diet, exercise, lifestyle modifications. Monitor appropriate labs. - BASIC METABOLIC PANEL WITH GFR  3. COPD exacerbation ([place])   4. Medication management  - CBC with Differential/Platelet - BASIC METABOLIC PANEL WITH GFR  5. Essential hypertension  - Continue diet/meds, exercise,& lifestyle modifications. Continue monitor periodic  cholesterol/liver & renal functions  - Continue medication, monitor blood  pressure at home. Continue DASH diet. Reminder to go to the ER if any CP, SOB, nausea, dizziness, severe HA, changes vision/speech, left arm numbness and tingling and jaw pain. - atenolol (TENORMIN) 25 MG tablet; Take 25 mg tab 2 x/ day for BP   Recommended regular exercise, BP monitoring, weight control, and discussed med and SE's. Recommended labs to assess and monitor clinical status. Further disposition pending results of labs. Over 30 minutes of exam, counseling, chart review was performed

## 2016-05-06 DIAGNOSIS — J441 Chronic obstructive pulmonary disease with (acute) exacerbation: Secondary | ICD-10-CM | POA: Diagnosis not present

## 2016-05-06 DIAGNOSIS — Z7951 Long term (current) use of inhaled steroids: Secondary | ICD-10-CM | POA: Diagnosis not present

## 2016-05-06 DIAGNOSIS — J440 Chronic obstructive pulmonary disease with acute lower respiratory infection: Secondary | ICD-10-CM | POA: Diagnosis not present

## 2016-05-06 DIAGNOSIS — E43 Unspecified severe protein-calorie malnutrition: Secondary | ICD-10-CM | POA: Diagnosis not present

## 2016-05-06 DIAGNOSIS — J9621 Acute and chronic respiratory failure with hypoxia: Secondary | ICD-10-CM | POA: Diagnosis not present

## 2016-05-06 DIAGNOSIS — J189 Pneumonia, unspecified organism: Secondary | ICD-10-CM | POA: Diagnosis not present

## 2016-05-06 DIAGNOSIS — I110 Hypertensive heart disease with heart failure: Secondary | ICD-10-CM | POA: Diagnosis not present

## 2016-05-06 DIAGNOSIS — E039 Hypothyroidism, unspecified: Secondary | ICD-10-CM | POA: Diagnosis not present

## 2016-05-06 DIAGNOSIS — J90 Pleural effusion, not elsewhere classified: Secondary | ICD-10-CM | POA: Diagnosis not present

## 2016-05-06 DIAGNOSIS — Z9981 Dependence on supplemental oxygen: Secondary | ICD-10-CM | POA: Diagnosis not present

## 2016-05-06 DIAGNOSIS — I5032 Chronic diastolic (congestive) heart failure: Secondary | ICD-10-CM | POA: Diagnosis not present

## 2016-05-06 LAB — BASIC METABOLIC PANEL WITHOUT GFR
BUN: 19 mg/dL (ref 7–25)
CO2: 40 mmol/L — ABNORMAL HIGH (ref 20–31)
Calcium: 8.3 mg/dL — ABNORMAL LOW (ref 8.6–10.4)
Chloride: 88 mmol/L — ABNORMAL LOW (ref 98–110)
Creat: 1.09 mg/dL — ABNORMAL HIGH (ref 0.60–0.88)
GFR, Est African American: 53 mL/min — ABNORMAL LOW
GFR, Est Non African American: 46 mL/min — ABNORMAL LOW
Glucose, Bld: 75 mg/dL (ref 65–99)
Potassium: 3.6 mmol/L (ref 3.5–5.3)
Sodium: 139 mmol/L (ref 135–146)

## 2016-05-07 ENCOUNTER — Encounter: Payer: Self-pay | Admitting: *Deleted

## 2016-05-10 DIAGNOSIS — J441 Chronic obstructive pulmonary disease with (acute) exacerbation: Secondary | ICD-10-CM | POA: Diagnosis not present

## 2016-05-10 DIAGNOSIS — I110 Hypertensive heart disease with heart failure: Secondary | ICD-10-CM | POA: Diagnosis not present

## 2016-05-10 DIAGNOSIS — J90 Pleural effusion, not elsewhere classified: Secondary | ICD-10-CM | POA: Diagnosis not present

## 2016-05-10 DIAGNOSIS — E43 Unspecified severe protein-calorie malnutrition: Secondary | ICD-10-CM | POA: Diagnosis not present

## 2016-05-10 DIAGNOSIS — I5032 Chronic diastolic (congestive) heart failure: Secondary | ICD-10-CM | POA: Diagnosis not present

## 2016-05-10 DIAGNOSIS — J440 Chronic obstructive pulmonary disease with acute lower respiratory infection: Secondary | ICD-10-CM | POA: Diagnosis not present

## 2016-05-10 DIAGNOSIS — J189 Pneumonia, unspecified organism: Secondary | ICD-10-CM | POA: Diagnosis not present

## 2016-05-10 DIAGNOSIS — J9621 Acute and chronic respiratory failure with hypoxia: Secondary | ICD-10-CM | POA: Diagnosis not present

## 2016-05-10 DIAGNOSIS — Z9981 Dependence on supplemental oxygen: Secondary | ICD-10-CM | POA: Diagnosis not present

## 2016-05-10 DIAGNOSIS — Z7951 Long term (current) use of inhaled steroids: Secondary | ICD-10-CM | POA: Diagnosis not present

## 2016-05-10 DIAGNOSIS — E039 Hypothyroidism, unspecified: Secondary | ICD-10-CM | POA: Diagnosis not present

## 2016-05-13 DIAGNOSIS — E039 Hypothyroidism, unspecified: Secondary | ICD-10-CM | POA: Diagnosis not present

## 2016-05-13 DIAGNOSIS — E43 Unspecified severe protein-calorie malnutrition: Secondary | ICD-10-CM | POA: Diagnosis not present

## 2016-05-13 DIAGNOSIS — J441 Chronic obstructive pulmonary disease with (acute) exacerbation: Secondary | ICD-10-CM | POA: Diagnosis not present

## 2016-05-13 DIAGNOSIS — J9621 Acute and chronic respiratory failure with hypoxia: Secondary | ICD-10-CM | POA: Diagnosis not present

## 2016-05-13 DIAGNOSIS — J90 Pleural effusion, not elsewhere classified: Secondary | ICD-10-CM | POA: Diagnosis not present

## 2016-05-13 DIAGNOSIS — J189 Pneumonia, unspecified organism: Secondary | ICD-10-CM | POA: Diagnosis not present

## 2016-05-13 DIAGNOSIS — Z9981 Dependence on supplemental oxygen: Secondary | ICD-10-CM | POA: Diagnosis not present

## 2016-05-13 DIAGNOSIS — I110 Hypertensive heart disease with heart failure: Secondary | ICD-10-CM | POA: Diagnosis not present

## 2016-05-13 DIAGNOSIS — Z7951 Long term (current) use of inhaled steroids: Secondary | ICD-10-CM | POA: Diagnosis not present

## 2016-05-13 DIAGNOSIS — J440 Chronic obstructive pulmonary disease with acute lower respiratory infection: Secondary | ICD-10-CM | POA: Diagnosis not present

## 2016-05-13 DIAGNOSIS — I5032 Chronic diastolic (congestive) heart failure: Secondary | ICD-10-CM | POA: Diagnosis not present

## 2016-05-21 DIAGNOSIS — J9621 Acute and chronic respiratory failure with hypoxia: Secondary | ICD-10-CM | POA: Diagnosis not present

## 2016-05-21 DIAGNOSIS — J90 Pleural effusion, not elsewhere classified: Secondary | ICD-10-CM | POA: Diagnosis not present

## 2016-05-21 DIAGNOSIS — J441 Chronic obstructive pulmonary disease with (acute) exacerbation: Secondary | ICD-10-CM | POA: Diagnosis not present

## 2016-05-21 DIAGNOSIS — E039 Hypothyroidism, unspecified: Secondary | ICD-10-CM | POA: Diagnosis not present

## 2016-05-21 DIAGNOSIS — I5032 Chronic diastolic (congestive) heart failure: Secondary | ICD-10-CM | POA: Diagnosis not present

## 2016-05-21 DIAGNOSIS — J440 Chronic obstructive pulmonary disease with acute lower respiratory infection: Secondary | ICD-10-CM | POA: Diagnosis not present

## 2016-05-21 DIAGNOSIS — Z9981 Dependence on supplemental oxygen: Secondary | ICD-10-CM | POA: Diagnosis not present

## 2016-05-21 DIAGNOSIS — J189 Pneumonia, unspecified organism: Secondary | ICD-10-CM | POA: Diagnosis not present

## 2016-05-21 DIAGNOSIS — Z7951 Long term (current) use of inhaled steroids: Secondary | ICD-10-CM | POA: Diagnosis not present

## 2016-05-21 DIAGNOSIS — E43 Unspecified severe protein-calorie malnutrition: Secondary | ICD-10-CM | POA: Diagnosis not present

## 2016-05-21 DIAGNOSIS — I110 Hypertensive heart disease with heart failure: Secondary | ICD-10-CM | POA: Diagnosis not present

## 2016-05-29 DIAGNOSIS — J439 Emphysema, unspecified: Secondary | ICD-10-CM | POA: Diagnosis not present

## 2016-05-29 DIAGNOSIS — J9621 Acute and chronic respiratory failure with hypoxia: Secondary | ICD-10-CM | POA: Diagnosis not present

## 2016-06-05 DIAGNOSIS — J449 Chronic obstructive pulmonary disease, unspecified: Secondary | ICD-10-CM | POA: Diagnosis not present

## 2016-06-07 ENCOUNTER — Ambulatory Visit: Payer: Self-pay | Admitting: Physician Assistant

## 2016-06-10 ENCOUNTER — Ambulatory Visit (INDEPENDENT_AMBULATORY_CARE_PROVIDER_SITE_OTHER): Payer: Medicare Other | Admitting: Physician Assistant

## 2016-06-10 ENCOUNTER — Ambulatory Visit (HOSPITAL_COMMUNITY)
Admission: RE | Admit: 2016-06-10 | Discharge: 2016-06-10 | Disposition: A | Discharge status: Home / Self Care | Payer: Medicare Other | Source: Ambulatory Visit | Attending: Physician Assistant | Admitting: Physician Assistant

## 2016-06-10 ENCOUNTER — Encounter: Payer: Self-pay | Admitting: Physician Assistant

## 2016-06-10 VITALS — BP 158/68 | HR 83 | Temp 97.4°F | Resp 14 | Ht 63.0 in | Wt 102.4 lb

## 2016-06-10 DIAGNOSIS — I700 Atherosclerosis of aorta: Secondary | ICD-10-CM | POA: Insufficient documentation

## 2016-06-10 DIAGNOSIS — E46 Unspecified protein-calorie malnutrition: Secondary | ICD-10-CM

## 2016-06-10 DIAGNOSIS — Z87891 Personal history of nicotine dependence: Secondary | ICD-10-CM | POA: Diagnosis not present

## 2016-06-10 DIAGNOSIS — E039 Hypothyroidism, unspecified: Secondary | ICD-10-CM | POA: Diagnosis not present

## 2016-06-10 DIAGNOSIS — N289 Disorder of kidney and ureter, unspecified: Secondary | ICD-10-CM | POA: Diagnosis not present

## 2016-06-10 DIAGNOSIS — J9621 Acute and chronic respiratory failure with hypoxia: Secondary | ICD-10-CM

## 2016-06-10 DIAGNOSIS — J90 Pleural effusion, not elsewhere classified: Secondary | ICD-10-CM | POA: Diagnosis not present

## 2016-06-10 DIAGNOSIS — J438 Other emphysema: Secondary | ICD-10-CM

## 2016-06-10 DIAGNOSIS — Z23 Encounter for immunization: Secondary | ICD-10-CM | POA: Diagnosis not present

## 2016-06-10 DIAGNOSIS — J439 Emphysema, unspecified: Secondary | ICD-10-CM | POA: Diagnosis not present

## 2016-06-10 DIAGNOSIS — I10 Essential (primary) hypertension: Secondary | ICD-10-CM

## 2016-06-10 DIAGNOSIS — J918 Pleural effusion in other conditions classified elsewhere: Secondary | ICD-10-CM | POA: Diagnosis not present

## 2016-06-10 LAB — CBC WITH DIFFERENTIAL/PLATELET
BASOS PCT: 0 %
Basophils Absolute: 0 cells/uL (ref 0–200)
EOS ABS: 108 {cells}/uL (ref 15–500)
Eosinophils Relative: 1 %
HCT: 34.2 % — ABNORMAL LOW (ref 35.0–45.0)
Hemoglobin: 11.1 g/dL — ABNORMAL LOW (ref 11.7–15.5)
LYMPHS PCT: 15 %
Lymphs Abs: 1620 cells/uL (ref 850–3900)
MCH: 29.7 pg (ref 27.0–33.0)
MCHC: 32.5 g/dL (ref 32.0–36.0)
MCV: 91.4 fL (ref 80.0–100.0)
MONOS PCT: 7 %
MPV: 11.4 fL (ref 7.5–12.5)
Monocytes Absolute: 756 cells/uL (ref 200–950)
Neutro Abs: 8316 cells/uL — ABNORMAL HIGH (ref 1500–7800)
Neutrophils Relative %: 77 %
PLATELETS: 201 10*3/uL (ref 140–400)
RBC: 3.74 MIL/uL — ABNORMAL LOW (ref 3.80–5.10)
RDW: 13.2 % (ref 11.0–15.0)
WBC: 10.8 10*3/uL (ref 3.8–10.8)

## 2016-06-10 LAB — TSH: TSH: 2.21 m[IU]/L

## 2016-06-10 NOTE — Patient Instructions (Signed)
Please monitor your BP at home If it stays above 140/90 please call the office Take the fluid pill as needed for swelling  Your CO2 was elevated, please monitor your oxygen at home, you may only need to wear it at night or when you are moving around

## 2016-06-10 NOTE — Progress Notes (Signed)
Assessment and Plan: 1. Needs flu shot - Flu vaccine HIGH DOSE PF  2. Essential hypertension Patient states BP is good at home, monitor at home, if continuously above 140/90 then need to increase atenolol - CBC with Differential/Platelet - Hepatic function panel  3. Other emphysema ([place]) Continue duoneb, would like benefit from breo but would like to see pul before starting - DG Chest 2 View; Future  4. Bilateral pleural effusion Repeat CXR to make sure there is resolution - DG Chest 2 View; Future  5. Acute on chronic respiratory failure with hypoxia (HCC) 02 much better, 96% RA, if CO2 elevated again, may only do O2 with exertion/night or may not be needed  6. Hypothyroidism, unspecified hypothyroidism type - TSH  7. Renal insufficiency - BASIC METABOLIC PANEL WITH GFR - continue follow up nephrology  8. Malnutrition [facility]) Add ensure  Future Appointments Date Time Provider [place]  07/19/2016 10:30 AM Vicie Mutters, PA-C [facility] None  07/19/2016 12:15 PM Lelon Perla, MD [facility]  10/04/2016 3:00 PM Vicie Mutters, PA-C [facility] None     HPI 82 y.o.female presents for 1 month follow up.  She was in the hospital from 07/24-30 for CAP and AKI so while in the hospital her lasix and losartan were stopped and she received gentle hydration. Her kidney function had improved last visit but her co2 was at 24. Daughter is here with her, she is on O2 at home 2L she uses it more when she is walking/moving around, she has pulse ox at home and will go as low as 82 but average is 90. She denies any fatigue, confusion, etc. Today in the office it is 96 RA. Will check xray this OV. She is back on losartan 50m, norvasc 582m atenolol 2536mand not on lasix, she does have some swelling. She states her BP has been 130'-140's at home.   Wt Readings from Last 3 Encounters:  06/10/16 102 lb 6.4 oz (46.4 kg)  05/05/16 100 lb 9.6 oz (45.6 kg)  04/19/16 99 lb 6.4 oz  (45.1 kg)    Lab Results  Component Value Date   CREATININE 1.09 (H) 05/05/2016   BUN 19 05/05/2016   NA 139 05/05/2016   K 3.6 05/05/2016   CL 88 (L) 05/05/2016   CO2 40 (H) 05/05/2016    Past Medical History:  Diagnosis Date  . Aortic regurgitation   . [place]chiectasis ([place] . COPD (chronic obstructive pulmonary disease) ([place] . DJD (degenerative joint disease)   . HLD (hyperlipidemia)   . HTN (hypertension)   . Mild mitral regurgitation by prior echocardiogram   . Mild tricuspid regurgitation   . Palpitations   . Thyroid disease      Allergies  Allergen Reactions  . Sulfa Antibiotics Palpitations    Unknown      Current Outpatient Prescriptions on File Prior to Visit  Medication Sig Dispense Refill  . ALPRAZolam (XANAX) 0.5 MG tablet Take 1/2 to 1 tablet 3 x day if needed for nerves 90 tablet 1  . amLODipine (NORVASC) 10 MG tablet Take 0.5 tablets (5 mg total) by mouth daily.    . Ascorbic Acid (VITAMIN C) 500 MG tablet Take 500 mg by mouth daily.      . aMarland Kitchenenolol (TENORMIN) 25 MG tablet Take 25 mg tab 2 x/ day for BP    . cholecalciferol (VITAMIN D) 1000 UNITS tablet Take 1,000 Units by mouth daily.     . furosemide (LASIX)  20 MG tablet Take 1 tablet (20 mg total) by mouth daily as needed for fluid. Take 1 tablet by mouth daily with breakfast for leg swelling. 30 tablet 0  . ipratropium-albuterol (DUONEB) 0.5-2.5 (3) MG/3ML SOLN Take 3 mLs by nebulization every 6 (six) hours as needed. 360 mL 1  . losartan (COZAAR) 100 MG tablet Take 0.5 tablets (50 mg total) by mouth daily. 90 tablet 1  . Magnesium 500 MG TABS Take 0.5 tablets by mouth daily.     [name] omeprazole (PRILOSEC) 40 MG capsule TAKE ONE (1) CAPSULE EACH DAY 90 capsule 1  . levothyroxine (SYNTHROID, LEVOTHROID) 50 MCG tablet Take 1 tablet daily or as directed (Patient taking differently: ONE-HALF TABLET Friday AND SUNDAY) 90 tablet 1   No current facility-administered medications on file prior to visit.      ROS: all negative except above.   Physical Exam: Filed Weights   06/10/16 1459  Weight: 102 lb 6.4 oz (46.4 kg)   BP (!) 158/68   Pulse 83   Temp 97.4 F (36.3 C)   Resp 14   Ht _0  (1.6 m)   Wt 102 lb 6.4 oz (46.4 kg)   SpO2 96%   BMI 18.14 kg/m  General appearance: alert, cachetic, no distress, WD/WN,  female HEENT: normocephalic, sclerae anicteric, TMs pearly, nares patent, no discharge or erythema, pharynx normal Oral cavity: MMM, no lesions Neck: supple, no lymphadenopathy, no thyromegaly, no masses Heart: RRR, normal S1, S2, no murmurs Lungs: distant heart sounds, decreased BS right lung base, no wheezes, rhonchi, or rales Abdomen: +bs, soft, non tender, non distended, no masses, no hepatomegaly, no splenomegaly Musculoskeletal: nontender, no swelling, no obvious deformity Extremities: + 1-2 + edema bilateral legs no cyanosis, no clubbing Pulses: 2+ symmetric, upper and lower extremities, normal cap refill Neurological: alert, oriented x 3, CN2-12 intact, strength normal upper extremities and lower extremities,  DTRs 2+ throughout, no cerebellar signs, gait normal Psychiatric: normal affect, behavior normal, pleasant     Vicie Mutters, PA-C 3:14 PM [facility]

## 2016-06-11 LAB — BASIC METABOLIC PANEL WITH GFR
BUN: 19 mg/dL (ref 7–25)
CO2: 29 mmol/L (ref 20–31)
CREATININE: 0.95 mg/dL — AB (ref 0.60–0.88)
Calcium: 9.8 mg/dL (ref 8.6–10.4)
Chloride: 92 mmol/L — ABNORMAL LOW (ref 98–110)
GFR, EST AFRICAN AMERICAN: 63 mL/min (ref >=60)
GFR, Est Non African American: 55 mL/min — ABNORMAL LOW (ref >=60)
Glucose, Bld: 86 mg/dL (ref 65–99)
POTASSIUM: 4.6 mmol/L (ref 3.5–5.3)
Sodium: 138 mmol/L (ref 135–146)

## 2016-06-11 LAB — HEPATIC FUNCTION PANEL
ALBUMIN: 4.4 g/dL (ref 3.6–5.1)
ALK PHOS: 77 U/L (ref 33–130)
ALT: 9 U/L (ref 6–29)
AST: 23 U/L (ref 10–35)
BILIRUBIN TOTAL: 0.6 mg/dL (ref 0.2–1.2)
Bilirubin, Direct: 0.1 mg/dL (ref <=0.2)
Indirect Bilirubin: 0.5 mg/dL (ref 0.2–1.2)
Total Protein: 7.5 g/dL (ref 6.1–8.1)

## 2016-06-21 ENCOUNTER — Other Ambulatory Visit: Payer: Self-pay | Admitting: Internal Medicine

## 2016-06-28 ENCOUNTER — Other Ambulatory Visit: Payer: Self-pay | Admitting: Internal Medicine

## 2016-06-28 DIAGNOSIS — J9621 Acute and chronic respiratory failure with hypoxia: Secondary | ICD-10-CM | POA: Diagnosis not present

## 2016-06-28 DIAGNOSIS — J439 Emphysema, unspecified: Secondary | ICD-10-CM | POA: Diagnosis not present

## 2016-06-28 MED ORDER — BISOPROLOL-HYDROCHLOROTHIAZIDE 2.5-6.25 MG PO TABS: ORAL_TABLET | ORAL | 1 refills | Status: DC

## 2016-07-01 ENCOUNTER — Telehealth: Payer: Self-pay | Admitting: Cardiology

## 2016-07-01 NOTE — Telephone Encounter (Signed)
Spoke to patient. She notes anxiety, palps x3-4 days. She voiced that she has had this issue before. Wore a heart monitor recently (showed PACs mainly) - no dx of A Fib.  Pt denies SOB, dizziness, fatigue, weakness, lightheadedness. She denies chest pain. Notes she takes a Xanax nightly but has option to take 3-4 times daily - took an extra tab this AM and reports improvement of her palpitations w that dose. I offered appt tomorrow w Almyra Deforest, and patient initally took this offering, but then asked to cancel. Pt states she will try to "just see how it goes over the weekend" and will call back Monday if she still has concerns. Pt understands she may increase her xanax use according to the prescribing MDs recommendations.  She already had an appt scheduled w Dr. Stanford Breed on 10/23 and states preference to see MD rather than PA. She will plan to follow up then. Aware to call if new problems or if she wishes to be seen by PA in interim.

## 2016-07-01 NOTE — Telephone Encounter (Signed)
New Message  Patient c/o Palpitations:  High priority if patient c/o lightheadedness and shortness of breath.  1. How long have you been having palpitations? 3-4 days  2. Are you currently experiencing lightheadedness and shortness of breath? No  3. Have you checked your BP and heart rate? (document readings) No  4. Are you experiencing any other symptoms? No, but pt voiced took a xanax so slow heart down from racing.

## 2016-07-02 ENCOUNTER — Ambulatory Visit: Payer: Medicare Other | Admitting: Physician Assistant

## 2016-07-05 DIAGNOSIS — J449 Chronic obstructive pulmonary disease, unspecified: Secondary | ICD-10-CM | POA: Diagnosis not present

## 2016-07-07 ENCOUNTER — Encounter: Payer: Self-pay | Admitting: Cardiology

## 2016-07-09 ENCOUNTER — Ambulatory Visit: Payer: Medicare Other | Admitting: Cardiology

## 2016-07-11 NOTE — Progress Notes (Deleted)
HPI: FU palpitations and hypertension. Nuclear study in October 2009 revealed an ejection fraction of 73% and normal perfusion. CardioNet in February of 2014 showed sinus rhythm with PACs, PVCs, occasional junctional escape beats and pauses greater than 3 seconds. Cardizem was discontinued and Norvasc added. Monitor in Jan 2017 showed sinus rhythm with PACs and brief PAT. Echocardiogram July 2017 showed normal LV systolic function, mild left ventricular hypertrophy, grade 2 diastolic dysfunction, mild aortic insufficiency, mild to moderate mitral regurgitation, mild biatrial enlargement, moderate tricuspid regurgitation and moderately elevated pulmonary pressures. Since last seen,   Current Outpatient Prescriptions  Medication Sig Dispense Refill  . ALPRAZolam (XANAX) 0.5 MG tablet Take 1/2 to 1 tablet 3 x day if needed for nerves 90 tablet 1  . amLODipine (NORVASC) 10 MG tablet Take 0.5 tablets (5 mg total) by mouth daily.    . Ascorbic Acid (VITAMIN C) 500 MG tablet Take 500 mg by mouth daily.      . bisoprolol-hydrochlorothiazide (ZIAC) 2.5-6.25 MG tablet Take 1 tablet daily for BP 90 tablet 1  . cholecalciferol (VITAMIN D) 1000 UNITS tablet Take 1,000 Units by mouth daily.     . furosemide (LASIX) 20 MG tablet Take 1 tablet (20 mg total) by mouth daily as needed for fluid. Take 1 tablet by mouth daily with breakfast for leg swelling. 30 tablet 0  . ipratropium-albuterol (DUONEB) 0.5-2.5 (3) MG/3ML SOLN Take 3 mLs by nebulization every 6 (six) hours as needed. 360 mL 1  . levothyroxine (SYNTHROID, LEVOTHROID) 50 MCG tablet Take 1 tablet daily or as directed (Patient taking differently: ONE-HALF TABLET Friday AND SUNDAY) 90 tablet 1  . losartan (COZAAR) 100 MG tablet Take 0.5 tablets (50 mg total) by mouth daily. 90 tablet 1  . Magnesium 500 MG TABS Take 0.5 tablets by mouth daily.     [name] omeprazole (PRILOSEC) 40 MG capsule TAKE ONE (1) CAPSULE EACH DAY 90 capsule 1   No current  facility-administered medications for this visit.      Past Medical History:  Diagnosis Date  . Aortic regurgitation   . Bronchiectasis ([place])   . COPD (chronic obstructive pulmonary disease) ([place])   . DJD (degenerative joint disease)   . HLD (hyperlipidemia)   . HTN (hypertension)   . Mild mitral regurgitation by prior echocardiogram   . Mild tricuspid regurgitation   . Palpitations   . Thyroid disease     Past Surgical History:  Procedure Laterality Date  . ABDOMINAL HYSTERECTOMY  1993  . CATARACT EXTRACTION, BILATERAL      Social History   Social History  . Marital status: Married    Spouse name: N/A  . Number of children: 5  . Years of education: N/A   Occupational History  . retired    Social History Main Topics  . Smoking status: Former Smoker    Packs/day: 1.00    Years: 20.00    Types: Cigarettes    Quit date: 09/27/1974  . Smokeless tobacco: Never Used  . Alcohol use No  . Drug use: No  . Sexual activity: No   Other Topics Concern  . Not on file   Social History Narrative  . No narrative on file    Family History  Problem Relation Age of Onset  . Emphysema Mother   . Diabetes Mother   . Cancer Father   . Emphysema Sister   . Emphysema Sister   . Rheumatic fever Sister   . Heart attack Sister   .  Diabetes Sister   . Diabetes Brother   . Asthma Brother     as a child    ROS: no fevers or chills, productive cough, hemoptysis, dysphasia, odynophagia, melena, hematochezia, dysuria, hematuria, rash, seizure activity, orthopnea, PND, pedal edema, claudication. Remaining systems are negative.  Physical Exam: Well-developed well-nourished in no acute distress.  Skin is warm and dry.  HEENT is normal.  Neck is supple.  Chest is clear to auscultation with normal expansion.  Cardiovascular exam is regular rate and rhythm.  Abdominal exam nontender or distended. No masses palpated. Extremities show no edema. neuro grossly intact  ECG

## 2016-07-19 ENCOUNTER — Ambulatory Visit: Payer: Self-pay | Admitting: Physician Assistant

## 2016-07-19 ENCOUNTER — Ambulatory Visit: Payer: Medicare Other | Admitting: Cardiology

## 2016-07-29 DIAGNOSIS — J439 Emphysema, unspecified: Secondary | ICD-10-CM | POA: Diagnosis not present

## 2016-07-29 DIAGNOSIS — J9621 Acute and chronic respiratory failure with hypoxia: Secondary | ICD-10-CM | POA: Diagnosis not present

## 2016-08-05 DIAGNOSIS — J449 Chronic obstructive pulmonary disease, unspecified: Secondary | ICD-10-CM | POA: Diagnosis not present

## 2016-08-11 NOTE — Progress Notes (Signed)
HPI: FU AI, palpitations and hypertension. Nuclear study in October 2009 revealed an ejection fraction of 73% and normal perfusion. CardioNet in February of 2014 showed sinus rhythm with PACs, PVCs, occasional junctional escape beats and pauses greater than 3 seconds. Cardizem was discontinued and Norvasc added. Monitor in Jan 2017 showed sinus rhythm with PACs and brief PAT. Echocardiogram repeated July 2017 and showed normal LV systolic function, grade 2 diastolic dysfunction, mild aortic insufficiency, mild to moderate mitral regurgitation, mild biatrial enlargement and moderate tricuspid regurgitation. Patient was admitted in July 2017 with acute on chronic respiratory failure and renal insufficiency. Patient was treated for pneumonia. Since last seen, she has mild dyspnea on exertion but no orthopnea or PND. Occasional mild pedal edema. No exertional chest pain. Occasional palpitations.  Current Outpatient Prescriptions  Medication Sig Dispense Refill  . ALPRAZolam (XANAX) 0.5 MG tablet Take 1/2 to 1 tablet 3 x day if needed for nerves 90 tablet 1  . amLODipine (NORVASC) 10 MG tablet Take 0.5 tablets (5 mg total) by mouth daily. (Patient taking differently: Take 10 mg by mouth daily. )    . Ascorbic Acid (VITAMIN C) 500 MG tablet Take 500 mg by mouth daily.      . bisoprolol-hydrochlorothiazide (ZIAC) 2.5-6.25 MG tablet Take 1 tablet daily for BP 90 tablet 1  . cholecalciferol (VITAMIN D) 1000 UNITS tablet Take 1,000 Units by mouth daily.     . furosemide (LASIX) 20 MG tablet Take 1 tablet (20 mg total) by mouth daily as needed for fluid. Take 1 tablet by mouth daily with breakfast for leg swelling. 30 tablet 0  . ipratropium-albuterol (DUONEB) 0.5-2.5 (3) MG/3ML SOLN Take 3 mLs by nebulization every 6 (six) hours as needed. 360 mL 1  . losartan (COZAAR) 100 MG tablet Take 0.5 tablets (50 mg total) by mouth daily. 90 tablet 1  . Magnesium 250 MG TABS Take 0.5 tablets by mouth daily.     [name]  omeprazole (PRILOSEC) 40 MG capsule TAKE ONE (1) CAPSULE EACH DAY 90 capsule 1  . levothyroxine (SYNTHROID, LEVOTHROID) 50 MCG tablet Take 1 tablet daily or as directed (Patient taking differently: ONE-HALF TABLET Friday AND SUNDAY) 90 tablet 1   No current facility-administered medications for this visit.      Past Medical History:  Diagnosis Date  . Aortic regurgitation   . Bronchiectasis ([place])   . COPD (chronic obstructive pulmonary disease) ([place])   . DJD (degenerative joint disease)   . HLD (hyperlipidemia)   . HTN (hypertension)   . Mild mitral regurgitation by prior echocardiogram   . Mild tricuspid regurgitation   . Palpitations   . Thyroid disease     Past Surgical History:  Procedure Laterality Date  . ABDOMINAL HYSTERECTOMY  1993  . CATARACT EXTRACTION, BILATERAL      Social History   Social History  . Marital status: Married    Spouse name: N/A  . Number of children: 5  . Years of education: N/A   Occupational History  . retired    Social History Main Topics  . Smoking status: Former Smoker    Packs/day: 1.00    Years: 20.00    Types: Cigarettes    Quit date: 09/27/1974  . Smokeless tobacco: Never Used  . Alcohol use No  . Drug use: No  . Sexual activity: No   Other Topics Concern  . Not on file   Social History Narrative  . No narrative on file  Family History  Problem Relation Age of Onset  . Emphysema Mother   . Diabetes Mother   . Cancer Father   . Emphysema Sister   . Emphysema Sister   . Rheumatic fever Sister   . Heart attack Sister   . Diabetes Sister   . Diabetes Brother   . Asthma Brother     as a child    ROS: no fevers or chills, productive cough, hemoptysis, dysphasia, odynophagia, melena, hematochezia, dysuria, hematuria, rash, seizure activity, orthopnea, PND, pedal edema, claudication. Remaining systems are negative.  Physical Exam: Well-developed frail in no acute distress.  Skin is warm and dry.  HEENT is normal.   Neck is supple.  Chest is clear to auscultation with normal expansion.  Cardiovascular exam is regular rate and rhythm.  Abdominal exam nontender or distended. No masses palpated. Extremities show no edema. neuro grossly intact   A/P  1 Hypertension-blood pressure controlled. Continue present medications.  2 palpitations-continue; if worsen will consider repeating monitor. Continue beta blocker.  3 aortic insufficiency-mild on most recent echocardiogram.   Kirk Ruths, MD

## 2016-08-17 ENCOUNTER — Ambulatory Visit (INDEPENDENT_AMBULATORY_CARE_PROVIDER_SITE_OTHER): Payer: Medicare Other | Admitting: Cardiology

## 2016-08-17 ENCOUNTER — Encounter: Payer: Self-pay | Admitting: Cardiology

## 2016-08-17 VITALS — BP 131/71 | HR 86 | Ht 63.0 in | Wt 99.6 lb

## 2016-08-17 DIAGNOSIS — I351 Nonrheumatic aortic (valve) insufficiency: Secondary | ICD-10-CM | POA: Diagnosis not present

## 2016-08-17 DIAGNOSIS — I10 Essential (primary) hypertension: Secondary | ICD-10-CM

## 2016-08-17 DIAGNOSIS — R002 Palpitations: Secondary | ICD-10-CM

## 2016-08-17 NOTE — Patient Instructions (Signed)
Your physician wants you to follow-up in: 6 MONTHS WITH DR CRENSHAW You will receive a reminder letter in the mail two months in advance. If you don't receive a letter, please call our office to schedule the follow-up appointment.   If you need a refill on your cardiac medications before your next appointment, please call your pharmacy.  

## 2016-08-28 DIAGNOSIS — J9621 Acute and chronic respiratory failure with hypoxia: Secondary | ICD-10-CM | POA: Diagnosis not present

## 2016-08-28 DIAGNOSIS — J439 Emphysema, unspecified: Secondary | ICD-10-CM | POA: Diagnosis not present

## 2016-09-03 ENCOUNTER — Other Ambulatory Visit: Payer: Self-pay | Admitting: Internal Medicine

## 2016-09-04 DIAGNOSIS — J449 Chronic obstructive pulmonary disease, unspecified: Secondary | ICD-10-CM | POA: Diagnosis not present

## 2016-09-09 ENCOUNTER — Ambulatory Visit (INDEPENDENT_AMBULATORY_CARE_PROVIDER_SITE_OTHER): Payer: Medicare Other | Admitting: Internal Medicine

## 2016-09-09 ENCOUNTER — Encounter: Payer: Self-pay | Admitting: Internal Medicine

## 2016-09-09 VITALS — BP 140/64 | HR 80 | Temp 97.2°F | Resp 16 | Ht 63.0 in | Wt 103.2 lb

## 2016-09-09 DIAGNOSIS — I87332 Chronic venous hypertension (idiopathic) with ulcer and inflammation of left lower extremity: Secondary | ICD-10-CM

## 2016-09-09 DIAGNOSIS — I10 Essential (primary) hypertension: Secondary | ICD-10-CM | POA: Diagnosis not present

## 2016-09-09 DIAGNOSIS — R609 Edema, unspecified: Secondary | ICD-10-CM

## 2016-09-09 DIAGNOSIS — L97929 Non-pressure chronic ulcer of unspecified part of left lower leg with unspecified severity: Secondary | ICD-10-CM

## 2016-09-09 DIAGNOSIS — Z79899 Other long term (current) drug therapy: Secondary | ICD-10-CM | POA: Diagnosis not present

## 2016-09-09 LAB — CBC WITH DIFFERENTIAL/PLATELET
BASOS ABS: 0 {cells}/uL (ref 0–200)
BASOS PCT: 0 %
EOS PCT: 2 %
Eosinophils Absolute: 134 cells/uL (ref 15–500)
HCT: 36.7 % (ref 35.0–45.0)
HEMOGLOBIN: 11.8 g/dL (ref 11.7–15.5)
LYMPHS ABS: 1541 {cells}/uL (ref 850–3900)
Lymphocytes Relative: 23 %
MCH: 29.8 pg (ref 27.0–33.0)
MCHC: 32.2 g/dL (ref 32.0–36.0)
MCV: 92.7 fL (ref 80.0–100.0)
MONOS PCT: 10 %
MPV: 11.4 fL (ref 7.5–12.5)
Monocytes Absolute: 670 cells/uL (ref 200–950)
NEUTROS ABS: 4355 {cells}/uL (ref 1500–7800)
Neutrophils Relative %: 65 %
PLATELETS: 216 10*3/uL (ref 140–400)
RBC: 3.96 MIL/uL (ref 3.80–5.10)
RDW: 12.8 % (ref 11.0–15.0)
WBC: 6.7 10*3/uL (ref 3.8–10.8)

## 2016-09-09 LAB — BASIC METABOLIC PANEL WITH GFR
BUN: 28 mg/dL — ABNORMAL HIGH (ref 7–25)
CALCIUM: 9.7 mg/dL (ref 8.6–10.4)
CHLORIDE: 97 mmol/L — AB (ref 98–110)
CO2: 34 mmol/L — ABNORMAL HIGH (ref 20–31)
CREATININE: 1.2 mg/dL — AB (ref 0.60–0.88)
GFR, EST NON AFRICAN AMERICAN: 41 mL/min — AB (ref >=60)
GFR, Est African American: 48 mL/min — ABNORMAL LOW (ref >=60)
Glucose, Bld: 84 mg/dL (ref 65–99)
Potassium: 3.9 mmol/L (ref 3.5–5.3)
SODIUM: 140 mmol/L (ref 135–146)

## 2016-09-09 MED ORDER — TRIAMCINOLONE ACETONIDE 0.1 % EX CREA: 1.0000 "application " | TOPICAL_CREAM | Freq: Two times a day (BID) | CUTANEOUS | 3 refills | Status: DC

## 2016-09-09 NOTE — Patient Instructions (Addendum)
Decrease Norvasc (Amlodipine) 10 mg   To 1/2 tablet daily  Restart your Lasix (Furosemide )  20 mg daily   Elevate legs when sitting  ++++++++++++++++++++++++++  Try Tumeric 600-900 mg  Tabs or capsules     2 x / day for aches & pains ++++++++++++++++++++++++++  Stasis Dermatitis Stasis dermatitis is a long-term (chronic) skin condition that happens when veins can no longer pump blood back to the heart (poor circulation). This condition causes a red or brown scaly rash or sores (ulcers) from the pooling of blood (stasis). This condition usually affects the lower legs. It may affect one leg or both legs. Without treatment, severe stasis dermatitis can lead to other skin conditions and infections. What are the causes? This condition is caused by poor circulation. What increases the risk? This condition is more likely to develop in people who:  Are not very active.  Stand for long periods of time.  Have veins that have become enlarged and twisted (varicose veins).  Have leg veins that are not strong enough to send blood back to the heart (venous insufficiency).  Have had a blood clot.  Have been pregnant many times.  Have had vein surgery.  Are obese.  Have heart or kidney failure.  Are 80 years of age or older. What are the signs or symptoms? Common early symptoms of this condition include:  Swelling in your ankle or leg. This might get better overnight but be worse again in the day.  Skin that looks thin on your ankle and leg.  [place]ark marks that develop slowly.  Skin that is easily irritated or cracked.  Red, swollen skin.  An achy or heavy feeling after you walk or stand for long periods of time.  Pain. Later and more severe symptoms of this condition include:  Skin that looks shiny.  Small, open sores (ulcers). These are often red or purple.  Dry, cracking skin.  Skin that feels hard.  Severe itching.  A change in the shape or color of your lower  legs.  Severe pain.  Difficulty walking. How is this diagnosed? Your health care provider may suspect this condition from your symptoms and medical history. Your health care provider will also do a physical exam. You may need to see a health care provider who specializes in skin diseases (dermatologist). You may also have tests to confirm the diagnosis, including:  Blood tests.  Imaging studies to check blood flow (Doppler ultrasound).  Allergy tests. How is this treated? Treatment for this condition may include medicine, such as:  Corticosteroid creams and ointments.  Non-corticosteroid medicines applied to the skin (topical).  Medicine to reduce swelling in the legs (diuretics).  Antibiotics.  Medicine to relieve itching (antihistamines). You may also have to wear:  Compression stockings or an elastic wrap to improve circulation.  A bandage (dressing).  A wrap that contains zinc and gelatin (Unna boot). Follow these instructions at home: [place] your skin as told by your health care provider. Do not use moisturizers with fragrance. This can irritate your skin.  Apply cool compresses to the affected areas.  Do not scratch your skin.  Do not rub your skin dry after a bath or shower. Gently pat your skin dry.  Do not use scented soaps, detergents, or perfumes. Medicines  Take or use over-the-counter and prescription medicines only as told by your health care provider.  If you were prescribed an antibiotic medicine, take or use it as told by your  health care provider. Do not stop taking or using the antibiotic even if your condition starts to improve. Lifestyle  Do not stand or sit in one position for long periods of time.  Do not cross your legs when you sit.  Raise (elevate) your legs above the level of your heart when you are sitting or lying down.  Walk as told by your health care provider. Walking increases blood flow.  Wear comfortable,  loose-fitting clothing. Circulation in your legs will be worse if you wear tight pants, belts, and waistbands. General instructions  Change and remove any dressing as told by your health care provider, if this applies.  Wear compression stockings as told by your health care provider, if this applies. These stockings help to prevent blood clots and reduce swelling in your legs.  Wear the [employer] as told by your health care provider, if this applies.  Keep all follow-up visits as told by your health care provider. This is important. Contact a health care provider if:  Your condition does not improve with treatment.  Your condition gets worse.  You have signs of infection in the affected area. Watch for:  Swelling.  Tenderness.  Redness.  Soreness.  Warmth.  You have a fever. Get help right away if:  You notice red streaks coming from the affected area.  Your bone or joint underneath the affected area becomes painful after the skin has healed.  The affected area turns darker.  You feel a deep pain in your leg or groin.  You are short of breath. This information is not intended to replace advice given to you by your health care provider. Make sure you discuss any questions you have with your health care provider. Document Released: 12/23/2005 Document Revised: 05/11/2016 Document Reviewed: 01/29/2015 Elsevier Interactive Patient Education  2017 [employer].

## 2016-09-09 NOTE — Progress Notes (Signed)
ADULT & ADOLESCENT INTERNAL MEDICINE   Unk Pinto, M.D.    Uvaldo Bristle. Silverio Lay, P.A.-C      Starlyn Skeans, P.A.-C  [facility]                [address] [place], [place] [address] Telephone [phone] Telefax [phone]  Subjective:    Patient ID: Andrea [name], female    DOB: 1929-05-20, 82 y.o.   MRN: [id number]  HPI  This very nice 82 yo WWF with HTN, COPD, HLD, PreDM, Hypothyroidism, Vit D Def presents with c/o increased swelling of her LLE and associated rash. Patient has increased her Amlodipine 34m back from 1/2 to 1 whole tab and has not been taking her furosemide 20 mg as advised to take prn fluid retention.   Medication Sig  . ALPRAZolam  0.5 MG tablet Take 1/2 to 1 tablet 3 x day if needed for nerves  . VITAMIN C 500 MG tablet Take 500 mg by mouth daily.    . bisoprolol-hctz 2.5-6.25 MG tablet Take 1 tablet daily for BP  . VITAMIN D 1000 UNITS Take 1,000 Units by mouth daily.   . Furosemide 20 MG tablet Take 1 tablet (20 mg total) by mouth daily as needed for fluid.  . DUONEB  G/3ML SOLN Take 3 mLs by nebulization every 6 (six) hours as needed.  .Marland Kitchenlevothyroxine  50 MCG tablet TAKE 1 TABLET BY MOUTH DAILY OR AS DIRECTED  . losartan  100 MG tablet Take 0.5 tablets (50 mg total) by mouth daily.  . Magnesium 250 MG TABS Take 0.5 tablets by mouth daily.   .Marland Kitchenomeprazole  40 MG capsule TAKE ONE (1) CAPSULE EACH DAY  . amLODipine  10 MG tablet Take 0.5 tabdaily. (Patient taking differently: Take 10 mg  daily. )   Allergies  Allergen Reactions  . Sulfa Antibiotics Palpitations    Unknown   Past Medical History:  Diagnosis Date  . Aortic regurgitation   . Bronchiectasis ([place]   . [place] disease) [place]   . DJD (degenerative joint disease)   . HLD (hyperlipidemia)   . HTN (hypertension)   . Mild mitral regurgitation by prior echocardiogram   . Mild tricuspid regurgitation   .  Palpitations   . Thyroid disease    Past Surgical History:  Procedure Laterality Date  . ABDOMINAL HYSTERECTOMY  1993  . CATARACT EXTRACTION, BILATERAL     Review of Systems  10 point systems review negative except as above.    Objective:   Physical Exam  BP 140/64   Pulse 80   Temp 97.2 F (36.2 C)   Resp 16   Ht _0  (1.6 m)   Wt 103 lb 3.2 oz (46.8 kg)   BMI 18.28 kg/m   HEENT - Eac's patent. TM's Nl. EOM's full. PERRLA. NasoOroPharynx clear. Neck - supple. Nl Thyroid. Carotids 2+ & No bruits, nodes, JVD Chest - Clear. Cor - Nl HS. RRR w/o sig MGR. PP 1(+). 1(+) LLE edema to the mid shin w/o calf tenderness, cords and homans' sign is negative.  Abd - soft benign. MS- FROM. Muscle power, tone and bulk decreased. Gait Nl. Neuro - Nl w/o focal abnormalities. Skin - typical venous stasis rash of the LLE,     Assessment & Plan:   1. Essential hypertension  - recommend decrease amlodipine to 1/2 tab  daily  2. Edema, unspecified type  - recommend restart Furosemide 20 mg daily til return in 3 weeks for CPE.   3. Stasis dermatitis of left lower extremity due to chronic peripheral venous hypertension (HCC)  - triamcinolone cream (KENALOG) 0.1 %; Apply 1 application topically 2 (two) times daily.  Dispense: 80 g; Refill: 3  4. Medication management  - discussed meds & SE's - CBC with Differential/Platelet - BASIC METABOLIC PANEL WITH GFR - ROV 3 weeks

## 2016-09-10 ENCOUNTER — Telehealth: Payer: Self-pay | Admitting: *Deleted

## 2016-09-10 NOTE — Telephone Encounter (Signed)
Spoke with patient and she reports she has Furosemide 40 mg tablets, not 20 mg tablets. Per Dr Melford Aase, take the 40 mg tabs until the edema in her legs has resolved and the she can take 40 mg every other day. Patient is aware of the instructions.

## 2016-09-25 ENCOUNTER — Other Ambulatory Visit: Payer: Self-pay | Admitting: Internal Medicine

## 2016-09-28 DIAGNOSIS — J439 Emphysema, unspecified: Secondary | ICD-10-CM | POA: Diagnosis not present

## 2016-09-28 DIAGNOSIS — J9621 Acute and chronic respiratory failure with hypoxia: Secondary | ICD-10-CM | POA: Diagnosis not present

## 2016-10-04 ENCOUNTER — Ambulatory Visit (INDEPENDENT_AMBULATORY_CARE_PROVIDER_SITE_OTHER): Payer: Medicare Other | Admitting: Physician Assistant

## 2016-10-04 ENCOUNTER — Encounter: Payer: Self-pay | Admitting: Physician Assistant

## 2016-10-04 VITALS — BP 118/62 | HR 86 | Temp 97.3°F | Resp 16 | Ht 63.0 in | Wt 104.0 lb

## 2016-10-04 DIAGNOSIS — E559 Vitamin D deficiency, unspecified: Secondary | ICD-10-CM | POA: Diagnosis not present

## 2016-10-04 DIAGNOSIS — E782 Mixed hyperlipidemia: Secondary | ICD-10-CM | POA: Diagnosis not present

## 2016-10-04 DIAGNOSIS — R7303 Prediabetes: Secondary | ICD-10-CM

## 2016-10-04 DIAGNOSIS — Z79899 Other long term (current) drug therapy: Secondary | ICD-10-CM | POA: Diagnosis not present

## 2016-10-04 DIAGNOSIS — I2723 Pulmonary hypertension due to lung diseases and hypoxia: Secondary | ICD-10-CM | POA: Diagnosis not present

## 2016-10-04 DIAGNOSIS — J90 Pleural effusion, not elsewhere classified: Secondary | ICD-10-CM | POA: Diagnosis not present

## 2016-10-04 DIAGNOSIS — I10 Essential (primary) hypertension: Secondary | ICD-10-CM

## 2016-10-04 DIAGNOSIS — R6889 Other general symptoms and signs: Secondary | ICD-10-CM

## 2016-10-04 DIAGNOSIS — E039 Hypothyroidism, unspecified: Secondary | ICD-10-CM

## 2016-10-04 DIAGNOSIS — R000 Tachycardia, unspecified: Secondary | ICD-10-CM | POA: Diagnosis not present

## 2016-10-04 DIAGNOSIS — R5383 Other fatigue: Secondary | ICD-10-CM

## 2016-10-04 DIAGNOSIS — M810 Age-related osteoporosis without current pathological fracture: Secondary | ICD-10-CM

## 2016-10-04 DIAGNOSIS — Z23 Encounter for immunization: Secondary | ICD-10-CM | POA: Diagnosis not present

## 2016-10-04 DIAGNOSIS — R918 Other nonspecific abnormal finding of lung field: Secondary | ICD-10-CM

## 2016-10-04 DIAGNOSIS — Z0001 Encounter for general adult medical examination with abnormal findings: Secondary | ICD-10-CM

## 2016-10-04 DIAGNOSIS — E46 Unspecified protein-calorie malnutrition: Secondary | ICD-10-CM | POA: Diagnosis not present

## 2016-10-04 DIAGNOSIS — C4490 Unspecified malignant neoplasm of skin, unspecified: Secondary | ICD-10-CM

## 2016-10-04 DIAGNOSIS — E871 Hypo-osmolality and hyponatremia: Secondary | ICD-10-CM | POA: Diagnosis not present

## 2016-10-04 DIAGNOSIS — J438 Other emphysema: Secondary | ICD-10-CM | POA: Diagnosis not present

## 2016-10-04 DIAGNOSIS — M19019 Primary osteoarthritis, unspecified shoulder: Secondary | ICD-10-CM

## 2016-10-04 DIAGNOSIS — I340 Nonrheumatic mitral (valve) insufficiency: Secondary | ICD-10-CM

## 2016-10-04 DIAGNOSIS — N289 Disorder of kidney and ureter, unspecified: Secondary | ICD-10-CM

## 2016-10-04 DIAGNOSIS — J449 Chronic obstructive pulmonary disease, unspecified: Secondary | ICD-10-CM

## 2016-10-04 LAB — CBC WITH DIFFERENTIAL/PLATELET
BASOS PCT: 0 %
Basophils Absolute: 0 cells/uL (ref 0–200)
EOS PCT: 1 %
Eosinophils Absolute: 71 cells/uL (ref 15–500)
HCT: 37.8 % (ref 35.0–45.0)
Hemoglobin: 11.9 g/dL (ref 11.7–15.5)
LYMPHS PCT: 27 %
Lymphs Abs: 1917 cells/uL (ref 850–3900)
MCH: 29 pg (ref 27.0–33.0)
MCHC: 31.5 g/dL — AB (ref 32.0–36.0)
MCV: 92.2 fL (ref 80.0–100.0)
MONOS PCT: 8 %
MPV: 11.6 fL (ref 7.5–12.5)
Monocytes Absolute: 568 cells/uL (ref 200–950)
NEUTROS PCT: 64 %
Neutro Abs: 4544 cells/uL (ref 1500–7800)
PLATELETS: 221 10*3/uL (ref 140–400)
RBC: 4.1 MIL/uL (ref 3.80–5.10)
RDW: 12.9 % (ref 11.0–15.0)
WBC: 7.1 10*3/uL (ref 3.8–10.8)

## 2016-10-04 LAB — TSH: TSH: 2.71 mIU/L

## 2016-10-04 NOTE — Patient Instructions (Addendum)
HOME CARE INSTRUCTIONS   Do not stand or sit in one position for long periods of time. Do not sit with your legs crossed. Rest with your legs raised during the day.  Your legs have to be higher than your heart so that gravity will force the valves to open, so please really elevate your legs.   Wear elastic stockings or support hose. Do not wear other tight, encircling garments around the legs, pelvis, or waist.  [employer]  has a wide variety of well priced compression stockings.   [place], [place] [address] [phone]  Walk as much as possible to increase blood flow.  Raise the foot of your bed at night with 2-inch blocks.  May want to get [place] CARE IF:   The skin around your ankle starts to break down.  You have pain, redness, tenderness, or hard swelling developing in your leg over a vein.  You are uncomfortable due to leg pain. Document Released: 06/23/2005 Document Revised: 12/06/2011 Document Reviewed: 11/09/2010 [facility] Patient Information 2014 [place].  Do the following things EVERYDAY: 1) Weigh yourself in the morning before breakfast. Write it down and keep it in a log. 2) Take your medicines as prescribed 3) Eat low salt foods-Limit salt (sodium) to 2000 mg per day. Best thing to do is avoid processed foods.   4) Stay as active as you can everyday 5) Limit all fluids for the day to less than 2 liters  Call your doctor if:  Anytime you have any of the following symptoms:  1) 3 pound weight gain in 24 hours or 5 pounds in 1 week  2) shortness of breath, with or without a dry hacking cough  3) swelling in the hands, feet or stomach  4) if you have to sleep on extra pillows at night in order to breathe. 5) after laying down at night for 20-30 mins, you wake up short of breath.   These can all be signs of fluid overload.

## 2016-10-04 NOTE — Progress Notes (Signed)
Complete Physical  Assessment and Plan: 1. Essential hypertension - TAKE LASIX AS NEEDED - CONTINUE NORVASC 38m - CBC with Differential - Hepatic function panel - BASIC METABOLIC PANEL WITH GFR - Urinalysis, Routine w reflex microscopic - Microalbumin / creatinine urine ratio   Mild mitral regurgitation by prior echocardiogram controlled  Pulmonary emphysema, unspecified emphysema type Breathing improved, monitor, continue follow up Dr. YAnnamaria Boots  Hyperlipidemia -continue medications, check lipids, decrease fatty foods, increase activity.  - Lipid panel  Prediabetes Discussed general issues about diabetes pathophysiology and management., Educational material distributed., Suggested low cholesterol diet., Encouraged aerobic exercise., Discussed foot care., Reminded to get yearly retinal exam.  6. Hypokalemia - BASIC METABOLIC PANEL WITH GFR  Hyponatremia - BASIC METABOLIC PANEL WITH GFR   Vitamin D deficiency - Vit D  25 hydroxy (rtn osteoporosis monitoring)  Medication management - Magnesium  Osteoarthritis of shoulder, unspecified laterality, unspecified osteoarthritis type   Lung nodules Continue follow up with Dr. YAnnamaria Boots  Hypothyroidism, unspecified hypothyroidism type - TSH   Malnutrition Check labs, try ensure/boost  Skin cancer Continue follow up Dr. LUbaldo Glassing has appointment in Feb  Pulmonary effusions Check CXR  Discussed med's effects and SE's. Screening labs and tests as requested with regular follow-up as recommended. Future Appointments Date Time Provider [place] 10/05/2017 3:00 PM AVicie Mutters PA-C [facility] None    HPI  83y.o. female  presents for a complete physical.   Her blood pressure has been controlled at home, today their BP is BP: 118/62  She does workout. She denies chest pain, shortness of breath, dizziness.  She has COPD and follows with Dr. YAnnamaria Boots last visit 07/04/2014, she had bilateral pleural effusion due to CAP in  July, she is now on O2 at night but not during the day.   She also follows with Dr. CStanford Breedfor aortic insuff. She has history of peripheral edema, is on 1/2 amlodipine, and she is on lasix as needed.  She is following Dr. BScherrie Novemberfor her eyes, and is driving at this time, taking driving test yearly.  She is not on cholesterol medication and denies myalgias. Her cholesterol is at goal. The cholesterol last visit was:   Lab Results  Component Value Date   CHOL 200 04/07/2016   HDL 97 04/07/2016   LDLCALC 89 04/07/2016   TRIG 72 04/07/2016   CHOLHDL 2.1 04/07/2016    She has been working on diet and exercise for prediabetes, and denies paresthesia of the feet, polydipsia, polyuria and visual disturbances. Last A1C in the office was:  Lab Results  Component Value Date   HGBA1C 5.9 (H) 04/07/2016   Patient is on Vitamin D supplement.   Lab Results  Component Value Date   VD25OH 43 12/30/2015     She is on thyroid medication. Her medication was changed last visit, increase dose to 1/5 pill daily except 1 pill on Friday and Sunday.  Lab Results  Component Value Date   TSH 2.21 06/10/2016   BMI is Body mass index is 18.42 kg/m., she is working on diet and exercise. Wt Readings from Last 3 Encounters:  10/04/16 104 lb (47.2 kg)  09/09/16 103 lb 3.2 oz (46.8 kg)  08/17/16 99 lb 9.6 oz (45.2 kg)    Current Medications:  Current Outpatient Prescriptions on File Prior to Visit  Medication Sig Dispense Refill  . ALPRAZolam (XANAX) 0.5 MG tablet Take 1/2 to 1 tablet 3 x day if needed for nerves 90 tablet 1  .  amLODipine (NORVASC) 10 MG tablet Take 10 mg by mouth daily.    . Ascorbic Acid (VITAMIN C) 500 MG tablet Take 500 mg by mouth daily.      . bisoprolol-hydrochlorothiazide (ZIAC) 2.5-6.25 MG tablet Take 1 tablet daily for BP 90 tablet 1  . cholecalciferol (VITAMIN D) 1000 UNITS tablet Take 1,000 Units by mouth daily.     . furosemide (LASIX) 20 MG tablet TAKE ONE TABLET (20MG TOTAL)  BY MOUTH DAILY, WITH BREAKFAST, AS NEEDED FOR FLUID (LEG SWELLING). 90 tablet 1  . ipratropium-albuterol (DUONEB) 0.5-2.5 (3) MG/3ML SOLN Take 3 mLs by nebulization every 6 (six) hours as needed. 360 mL 1  . levothyroxine (SYNTHROID, LEVOTHROID) 50 MCG tablet TAKE 1 TABLET BY MOUTH DAILY OR AS DIRECTED 90 tablet 1  . losartan (COZAAR) 100 MG tablet Take 0.5 tablets (50 mg total) by mouth daily. 90 tablet 1  . Magnesium 250 MG TABS Take 0.5 tablets by mouth daily.     [name] omeprazole (PRILOSEC) 40 MG capsule TAKE ONE (1) CAPSULE EACH DAY 90 capsule 1  . triamcinolone cream (KENALOG) 0.1 % Apply 1 application topically 2 (two) times daily. 80 g 3   No current facility-administered medications on file prior to visit.    Health Maintenance:   Immunization History  Administered Date(s) Administered  . Influenza Split 07/16/2013  . Influenza, High Dose Seasonal PF 06/25/2015, 06/10/2016  . Influenza,inj,Quad PF,36+ Mos 07/04/2014  . Pneumococcal Conjugate-13 02/26/2014  . Pneumococcal Polysaccharide-23 10/04/2016  . Td 06/26/2013   Last colonoscopy: 2008, declines another Last mammogram: 07/06/2013, declines another Last pap smear/pelvic exam: remote, declines another DEXA: 07/06/2013 + osteoporosis CXR 05/2016 Echo 03/2016 Dr. Stanford Breed  Prior vaccinations: TD or Tdap: 2014 Influenza: 05/2016 Pneumococcal: 2008 Prevnar 13: 02/2014 Shingles/Zostavax: declines  1. [facility]- here for primary care 2. Dr. Scherrie November, eye doctor, last visit April 2017 3. unknown, dentist, last visit 2 years Patient Care Team: Unk Pinto, MD as PCP - General (Internal Medicine) Deneise Lever, MD as Consulting Physician (Pulmonary Disease) Lelon Perla, MD as Consulting Physician (Cardiology) Inda Castle, MD as Consulting Physician (Gastroenterology) Rolm Bookbinder, MD as Consulting Physician (Dermatology)  Medical History:  Past Medical History:   Diagnosis Date  . Aortic regurgitation   . Bronchiectasis ([place])   . COPD (chronic obstructive pulmonary disease) ([place])   . DJD (degenerative joint disease)   . HLD (hyperlipidemia)   . HTN (hypertension)   . Mild mitral regurgitation by prior echocardiogram   . Mild tricuspid regurgitation   . Palpitations   . Thyroid disease    Allergies Allergies  Allergen Reactions  . Sulfa Antibiotics Palpitations    Unknown    SURGICAL HISTORY She  has a past surgical history that includes Abdominal hysterectomy (1993) and Cataract extraction, bilateral. FAMILY HISTORY Her family history includes Asthma in her brother; Cancer in her father; Diabetes in her brother, mother, and sister; Emphysema in her mother, sister, and sister; Heart attack in her sister; Rheumatic fever in her sister. SOCIAL HISTORY She  reports that she quit smoking about 42 years ago. Her smoking use included Cigarettes. She has a 20.00 pack-year smoking history. She has never used smokeless tobacco. She reports that she does not drink alcohol or use drugs.  Review of Systems  Constitutional: Negative for chills, fever and malaise/fatigue.  HENT: Positive for hearing loss (going to see ENT). Negative for congestion, ear discharge and sore throat.   Respiratory: Negative for  cough, shortness of breath and wheezing.   Cardiovascular: Negative for chest pain, palpitations and leg swelling.  Gastrointestinal: Negative for blood in stool, constipation, diarrhea and melena.  Genitourinary: Negative.   Skin: Negative.   Neurological: Negative for dizziness, sensory change, loss of consciousness and headaches.  Psychiatric/Behavioral: Negative for depression. The patient is not nervous/anxious and does not have insomnia.      Physical Exam: Estimated body mass index is 18.42 kg/m as calculated from the following:   Height as of this encounter: _0  (1.6 m).   Weight as of this encounter: 104 lb (47.2 kg). BP 118/62    Pulse 86   Temp 97.3 F (36.3 C)   Resp 16   Ht _1  (1.6 m)   Wt 104 lb (47.2 kg)   SpO2 96%   BMI 18.42 kg/m  General Appearance: Malnourished/underweight in no apparent distress.  Eyes: PERRLA, EOMs, conjunctiva no swelling or erythema, normal fundi and vessels.  Sinuses: No Frontal/maxillary tenderness  ENT/Mouth: Ext aud canals clear on the right but with cerumen impaction on the left, normal light reflex with TMs without erythema, bulging. Good dentition. No erythema, swelling, or exudate on post pharynx. Tonsils not swollen or erythematous. Hearing decreased Neck: Supple, thyroid normal. No bruits  Respiratory: Respiratory effort normal, decreased breathsounds, BS equal bilaterally without rales, rhonchi, wheezing or stridor.  Cardio: RRR with holosystolic 2/6, prominent S2. Brisk peripheral pulses with 2+ edema.  Chest: symmetric, with normal excursions and percussion.  Breasts: no palpable masses, no nipple discharge/retraction.   Abdomen: Soft, + epigastric tenderness, no guarding, rebound, hernias, masses, or organomegaly. .  Lymphatics: Non tender without lymphadenopathy.  Genitourinary:  Musculoskeletal: Full ROM all peripheral extremities,4/5 strength, and normal gait.  Skin: Warm, dry without rashes, lesions, ecchymosis. Neuro: Cranial nerves intact, reflexes equal bilaterally. Normal muscle tone, no cerebellar symptoms. [name]  Psych: Awake and oriented X 3, normal affect, Insight and Judgment appropriate.   EKG: had at Dr. Lonia Skinner  AORTA SCAN: declines   Vicie Mutters 3:21 PM [facility]

## 2016-10-05 DIAGNOSIS — J449 Chronic obstructive pulmonary disease, unspecified: Secondary | ICD-10-CM | POA: Diagnosis not present

## 2016-10-05 LAB — VITAMIN D 25 HYDROXY (VIT D DEFICIENCY, FRACTURES): Vit D, 25-Hydroxy: 68 ng/mL (ref 30–100)

## 2016-10-05 LAB — URINALYSIS, MICROSCOPIC ONLY
BACTERIA UA: NONE SEEN [HPF]
CASTS: NONE SEEN [LPF]
CRYSTALS: NONE SEEN [HPF]
RBC / HPF: NONE SEEN RBC/HPF (ref <=2)
YEAST: NONE SEEN [HPF]

## 2016-10-05 LAB — URINALYSIS, ROUTINE W REFLEX MICROSCOPIC
Bilirubin Urine: NEGATIVE
Glucose, UA: NEGATIVE
Hgb urine dipstick: NEGATIVE
Ketones, ur: NEGATIVE
NITRITE: NEGATIVE
SPECIFIC GRAVITY, URINE: 1.013 (ref 1.001–1.035)
pH: 7 (ref 5.0–8.0)

## 2016-10-05 LAB — LIPID PANEL
CHOLESTEROL: 226 mg/dL — AB (ref <200)
HDL: 113 mg/dL (ref >50)
LDL Cholesterol: 94 mg/dL (ref <100)
TRIGLYCERIDES: 94 mg/dL (ref <150)
Total CHOL/HDL Ratio: 2 Ratio (ref <5.0)
VLDL: 19 mg/dL (ref <30)

## 2016-10-05 LAB — BASIC METABOLIC PANEL WITH GFR
BUN: 24 mg/dL (ref 7–25)
CALCIUM: 9.6 mg/dL (ref 8.6–10.4)
CO2: 32 mmol/L — ABNORMAL HIGH (ref 20–31)
Chloride: 96 mmol/L — ABNORMAL LOW (ref 98–110)
Creat: 0.98 mg/dL — ABNORMAL HIGH (ref 0.60–0.88)
GFR, EST AFRICAN AMERICAN: 61 mL/min (ref >=60)
GFR, EST NON AFRICAN AMERICAN: 53 mL/min — AB (ref >=60)
GLUCOSE: 83 mg/dL (ref 65–99)
Potassium: 3.9 mmol/L (ref 3.5–5.3)
Sodium: 139 mmol/L (ref 135–146)

## 2016-10-05 LAB — HEPATIC FUNCTION PANEL
ALBUMIN: 4.2 g/dL (ref 3.6–5.1)
ALT: 8 U/L (ref 6–29)
AST: 22 U/L (ref 10–35)
Alkaline Phosphatase: 71 U/L (ref 33–130)
BILIRUBIN INDIRECT: 0.4 mg/dL (ref 0.2–1.2)
BILIRUBIN TOTAL: 0.5 mg/dL (ref 0.2–1.2)
Bilirubin, Direct: 0.1 mg/dL (ref <=0.2)
TOTAL PROTEIN: 7.6 g/dL (ref 6.1–8.1)

## 2016-10-05 LAB — MICROALBUMIN / CREATININE URINE RATIO
CREATININE, URINE: 85 mg/dL (ref 20–320)
MICROALB UR: 16.9 mg/dL
Microalb Creat Ratio: 199 mcg/mg creat — ABNORMAL HIGH (ref <30)

## 2016-10-05 LAB — SEDIMENTATION RATE: SED RATE: 30 mm/h (ref 0–30)

## 2016-10-05 LAB — MAGNESIUM: Magnesium: 1.9 mg/dL (ref 1.5–2.5)

## 2016-10-06 LAB — LYME ABY, WSTRN BLT IGG & IGM W/BANDS
B burgdorferi IgG Abs (IB): NEGATIVE
B burgdorferi IgM Abs (IB): NEGATIVE
LYME DISEASE 18 KD IGG: NONREACTIVE
LYME DISEASE 23 KD IGG: NONREACTIVE
LYME DISEASE 28 KD IGG: NONREACTIVE
LYME DISEASE 30 KD IGG: NONREACTIVE
LYME DISEASE 39 KD IGG: NONREACTIVE
Lyme Disease 23 kD IgM: NONREACTIVE
Lyme Disease 39 kD IgM: NONREACTIVE
Lyme Disease 41 kD IgG: REACTIVE — AB
Lyme Disease 41 kD IgM: NONREACTIVE
Lyme Disease 45 kD IgG: NONREACTIVE
Lyme Disease 58 kD IgG: NONREACTIVE
Lyme Disease 66 kD IgG: NONREACTIVE
Lyme Disease 93 kD IgG: NONREACTIVE

## 2016-10-29 DIAGNOSIS — J439 Emphysema, unspecified: Secondary | ICD-10-CM | POA: Diagnosis not present

## 2016-10-29 DIAGNOSIS — J9621 Acute and chronic respiratory failure with hypoxia: Secondary | ICD-10-CM | POA: Diagnosis not present

## 2016-11-05 DIAGNOSIS — J449 Chronic obstructive pulmonary disease, unspecified: Secondary | ICD-10-CM | POA: Diagnosis not present

## 2016-11-19 DIAGNOSIS — L570 Actinic keratosis: Secondary | ICD-10-CM | POA: Diagnosis not present

## 2016-11-19 DIAGNOSIS — I8312 Varicose veins of left lower extremity with inflammation: Secondary | ICD-10-CM | POA: Diagnosis not present

## 2016-11-19 DIAGNOSIS — I8311 Varicose veins of right lower extremity with inflammation: Secondary | ICD-10-CM | POA: Diagnosis not present

## 2016-11-19 DIAGNOSIS — L821 Other seborrheic keratosis: Secondary | ICD-10-CM | POA: Diagnosis not present

## 2016-11-19 DIAGNOSIS — L853 Xerosis cutis: Secondary | ICD-10-CM | POA: Diagnosis not present

## 2016-11-19 DIAGNOSIS — Z85828 Personal history of other malignant neoplasm of skin: Secondary | ICD-10-CM | POA: Diagnosis not present

## 2016-11-26 DIAGNOSIS — J439 Emphysema, unspecified: Secondary | ICD-10-CM | POA: Diagnosis not present

## 2016-11-26 DIAGNOSIS — J9621 Acute and chronic respiratory failure with hypoxia: Secondary | ICD-10-CM | POA: Diagnosis not present

## 2016-12-03 DIAGNOSIS — J449 Chronic obstructive pulmonary disease, unspecified: Secondary | ICD-10-CM | POA: Diagnosis not present

## 2016-12-16 ENCOUNTER — Other Ambulatory Visit: Payer: Self-pay | Admitting: Internal Medicine

## 2016-12-22 ENCOUNTER — Telehealth: Payer: Self-pay | Admitting: Cardiology

## 2016-12-22 NOTE — Telephone Encounter (Signed)
Pt of Dr. Corena Pilgrim to patient. She notes she's been having intermittent hard beating of heart, usually on waking in AM, for past 3-4 days.  She has had a couple episodes of this in the afternoon as well.  Pt states 1.5-2 mins in duration, w periodic repeat occurrences, "a thump, then a thump, then a thump". Wonders "if it's something I ate".  Notes she took a xanax after having 1st episode this morning, symptoms have yet to recur.  She denies SOB or chest pain, dizziness, fatigue or nausea.  Pt has visit scheduled for 12/27/16 w Bernerd Pho. I advised to keep this appt to evaluate & that I would let Dr. Stanford Breed know. Advised to call if new symptoms, use xanax if it helps. Pt voiced understanding and thanks. If any advice in interim please advise.

## 2016-12-22 NOTE — Telephone Encounter (Signed)
New Message  Pt voiced heart hasn't been beating right, beating hard, and wakes pt up and have to take xanax generic and still doesn't beat right per pt after taking med.  Looked for open slots today and no openings and pt wants to speak with nurse.

## 2016-12-26 NOTE — Progress Notes (Signed)
Cardiology Office Note    Date:  12/27/2016   ID:  Andrea [name], DOB [birth year], MRN [id number]  PCP:  Alesia Richards, MD  Cardiologist: Dr. Stanford Breed   Chief Complaint  Patient presents with  . Follow-up    "Thumping sensation in chest"    History of Present Illness:    Andrea [name] is a 83 y.o. female with past medical history of palpitations (history of PAC's, PVC's, and PAT), HTN, HLD, and COPD (uses 2L [place] nightly) who presents to the office today for evaluation of worsening palpitations.   Recent event monitor in 09/2015 showed mostly sinus rhythm with occasional PAC's and a brief episode of PAT. Echocardiogram from 03/2016 showed an EF of 60-65%, Grade 2 DD, no WMA, mild AI, and mild to moderate MR. She was last seen by Dr. Stanford Breed in 07/2016 and reported mild dyspnea with exertion and occasional palpitations but no exertional chest pain. She was continued on Amlodipine 79m daily, Bisoprolol-HCTZ 2.5-6.25mg daily, and Losartan 560mdaily.   In talking with the patient today, she reports an episode of a "thumping" sensation occurring in her chest last Wednesday. Says her heart felt like it was "beating harder" and says it did not feel like her usual palpitations. Says it was not a pain. It lasted less than 1 minute and spontaneously improved with belching. She did take a Xanax which completely relieved her symptoms. She denies any associated lightheadedness, dizziness, dyspnea, or presyncope at that time.   She reports being in her usual state of health since then. Says she is not overly active at baseline but denies any chest pain or dyspnea on exertion when carrying out her daily activities. Says her SBP is usually in the 130's but "is always elevated when she is at the doctor's office".   She reports good compliance with her medication regimen. Only takes PO Lasix 2056mRN for lower extremity edema. Has not taken his lately due to the side-effect of increased urination.  She denies any orthopnea or PND.    Past Medical History:  Diagnosis Date  . Aortic regurgitation   . Bronchiectasis ([place] . COPD (chronic obstructive pulmonary disease) ([place] . DJD (degenerative joint disease)   . HLD (hyperlipidemia)   . HTN (hypertension)   . Mild mitral regurgitation by prior echocardiogram   . Mild tricuspid regurgitation   . Palpitations    a. has known history of PAC's and PVC's. b. 09/2015: monitor showed sinus rhythm with occasional PAC's and a brief episode of PAT.  . TMarland Kitchenyroid disease     Past Surgical History:  Procedure Laterality Date  . ABDOMINAL HYSTERECTOMY  1993  . CATARACT EXTRACTION, BILATERAL      Current Medications: Outpatient Medications Prior to Visit  Medication Sig Dispense Refill  . amLODipine (NORVASC) 10 MG tablet Take 5 mg by mouth daily.     . Ascorbic Acid (VITAMIN C) 500 MG tablet Take 500 mg by mouth daily.      . bisoprolol-hydrochlorothiazide (ZIAC) 2.5-6.25 MG tablet TAKE ONE TABLET BY MOUTH EACH DAY FOR BLOOD PRESSURE 90 tablet 0  . cholecalciferol (VITAMIN D) 1000 UNITS tablet Take 1,000 Units by mouth daily.     . furosemide (LASIX) 20 MG tablet TAKE ONE TABLET (20MG TOTAL) BY MOUTH DAILY, WITH BREAKFAST, AS NEEDED FOR FLUID (LEG SWELLING). 90 tablet 1  . ipratropium-albuterol (DUONEB) 0.5-2.5 (3) MG/3ML SOLN Take 3 mLs by nebulization every 6 (six) hours as needed. 360 mL  1  . levothyroxine (SYNTHROID, LEVOTHROID) 50 MCG tablet TAKE 1 TABLET BY MOUTH DAILY OR AS DIRECTED 90 tablet 1  . losartan (COZAAR) 100 MG tablet Take 0.5 tablets (50 mg total) by mouth daily. 90 tablet 1  . Magnesium 250 MG TABS Take 0.5 tablets by mouth daily.     [name] omeprazole (PRILOSEC) 40 MG capsule TAKE ONE (1) CAPSULE EACH DAY 90 capsule 1  . triamcinolone cream (KENALOG) 0.1 % Apply 1 application topically 2 (two) times daily. 80 g 3   No facility-administered medications prior to visit.      Allergies:   Vancomycin and Sulfa antibiotics     Social History   Social History  . Marital status: Married    Spouse name: N/A  . Number of children: 5  . Years of education: N/A   Occupational History  . retired    Social History Main Topics  . Smoking status: Former Smoker    Packs/day: 1.00    Years: 20.00    Types: Cigarettes    Quit date: 09/27/1974  . Smokeless tobacco: Never Used  . Alcohol use No  . Drug use: No  . Sexual activity: No   Other Topics Concern  . None   Social History Narrative  . None     Family History:  The patient's family history includes Asthma in her brother; Cancer in her father; Diabetes in her brother, mother, and sister; Emphysema in her mother, sister, and sister; Heart attack in her sister; Rheumatic fever in her sister.   Review of Systems:   Please see the history of present illness.     General:  No chills, fever, night sweats or weight changes.  Cardiovascular:  No chest pain, dyspnea on exertion, edema, orthopnea, paroxysmal nocturnal dyspnea. Positive for palpitations. Dermatological: No rash, lesions/masses Respiratory: No cough, dyspnea Urologic: No hematuria, dysuria Abdominal:   No nausea, vomiting, diarrhea, bright red blood per rectum, melena, or hematemesis Neurologic:  No visual changes, wkns, changes in mental status. All other systems reviewed and are otherwise negative except as noted above.   Physical Exam:    VS:  BP (!) 155/70   Pulse 70   Ht _0  (1.6 m)   Wt 104 lb 6.4 oz (47.4 kg)   BMI 18.49 kg/m    General: Well developed, well nourished, elderly Caucasian female appearing in no acute distress. Head: Normocephalic, atraumatic, sclera non-icteric, no xanthomas, nares are without discharge.  Neck: No carotid bruits. JVD not elevated.  Lungs: Respirations regular and unlabored, without wheezes or rales.  Heart: Regular rate and rhythm. No S3 or S4.  No murmur, no rubs, or gallops appreciated. Abdomen: Soft, non-tender, non-distended with  normoactive bowel sounds. No hepatomegaly. No rebound/guarding. No obvious abdominal masses. Msk:  Strength and tone appear normal for age. No joint deformities or effusions. Extremities: No clubbing or cyanosis. Trace lower extremity edema bilaterally.  Distal pedal pulses are 2+ bilaterally. Neuro: Alert and oriented X 3. Moves all extremities spontaneously. No focal deficits noted. Psych:  Responds to questions appropriately with a normal affect. Skin: No rashes or lesions noted  Wt Readings from Last 3 Encounters:  12/27/16 104 lb 6.4 oz (47.4 kg)  10/04/16 104 lb (47.2 kg)  09/09/16 103 lb 3.2 oz (46.8 kg)     Studies/Labs Reviewed:   EKG:  EKG is ordered today.  The ekg ordered today demonstrates NSR, HR 70, LVH, and no acute ST or T-wave changes when compared to prior  tracings.   Recent Labs: 04/21/2016: B Natriuretic Peptide 422.0 10/04/2016: ALT 8; BUN 24; Creat 0.98; Hemoglobin 11.9; Magnesium 1.9; Platelets 221; Potassium 3.9; Sodium 139; TSH 2.71   Lipid Panel    Component Value Date/Time   CHOL 226 (H) 10/04/2016 1549   TRIG 94 10/04/2016 1549   HDL 113 10/04/2016 1549   CHOLHDL 2.0 10/04/2016 1549   VLDL 19 10/04/2016 1549   LDLCALC 94 10/04/2016 1549    Additional studies/ records that were reviewed today include:   Event Monitor: 09/2015 Sinus rhythm with pacs and brief PAT  Echocardiogram: 03/2016 Study Conclusions  - Left ventricle: The cavity size was normal. Wall thickness was   increased in a pattern of mild LVH. Systolic function was normal.   The estimated ejection fraction was in the range of 60% to 65%.   Wall motion was normal; there were no regional wall motion   abnormalities. Features are consistent with a pseudonormal left   ventricular filling pattern, with concomitant abnormal relaxation   and increased filling pressure (grade 2 diastolic dysfunction).   Doppler parameters are consistent with high ventricular filling   pressure. - Aortic  valve: Trileaflet; mildly thickened leaflets. There was   mild regurgitation. - Mitral valve: Mildly thickened leaflets . There was mild to   moderate regurgitation. - Left atrium: The atrium was mildly dilated. - Right atrium: The atrium was mildly dilated. - Tricuspid valve: Mildly thickened leaflets. There was moderate   regurgitation. - Pulmonary arteries: PA peak pressure: 55 mm Hg (S).  Assessment:    1. Palpitations   2. Chronic diastolic CHF (congestive heart failure) ([place])   3. Nonrheumatic aortic valve insufficiency   4. Essential hypertension   5. Chronic obstructive pulmonary disease, unspecified COPD type ([place])      Plan:   In order of problems listed above:  1. Palpitations - patient has a known history of PAC's, PVC's, and PAT with event monitor in 09/2015 showing mostly sinus rhythm with occasional PAC's and a brief episode of PAT.  - presents today reporting an episode of a "thumping" sensation occurring in her chest last Wednesday which lasted less than 1 minute and spontaneously improved with belching and Xanax. Denies any associated chest pain, dyspnea, lightheadedness, dizziness, dyspnea, or presyncope at that time. No recurrent episodes since.  - episode sounds most similar to known prior episodes of PAT. She is currently on low-dose Bisoprolol as she was switched from Atenolol to Bisoprolol last fall due to the national shortage at that time. She reports this has been "working well" and wishes to remain on her current dosing. We discussed that if she has recurrent palpitations, we can increase her Ziac from 2.5-6.25mg daily to 5.0-6.25mg daily. She will call if she has recurrent symptoms.   2. Chronic Diastolic CHF - echo in 41/9622 showed a preserved EF of 60-65% with Grade 2 DD.  - she does have lower extremity edema on examination today. Recommended she take her PO Lasix for the next 3 days then continue with PRN dosing. Recommended keeping track of daily  weights.   3. Aortic Insufficiency  - mild AI by echo in 03/2016. - consider repeat echo in 1-2 years.   4. HTN - BP elevated at 155/70 during initial check, at 148/72 when rechecked.  - reports having elevated BP readings when in the office, otherwise readings are well-controlled when checked at home (SBP in the 130's). I recommended she bring her home BP cuff in to be  checked against ours.  - continue Amlodipine 84m daily, Bisoprolol-HCTZ 2.5-6.25mg daily, and Losartan 538mdaily.   5. COPD - uses 2L [place] at night.  - followed by Pulmonology.   Medication Adjustments/Labs and Tests Ordered: Current medicines are reviewed at length with the patient today.  Concerns regarding medicines are outlined above.  Medication changes, Labs and Tests ordered today are listed in the Patient Instructions below. Patient Instructions  Medication Instructions: Your physician recommends that you continue on your current medications as directed. Please refer to the Current Medication list given to you today.   Follow-Up: Your physician wants you to follow-up in: 6 months with Dr. CrStanford BreedYou will receive a reminder letter in the mail two months in advance. If you don't receive a letter, please call our office to schedule the follow-up appointment.   Any Other Special Instructions will be listed below:  Please call if you experience worsening palpitations--[phone].   If you need a refill on your cardiac medications before your next appointment, please call your pharmacy.     SiArna MediciPAUtah4/10/2016 9:26 PM    [facility] [address]  [id number]: [phone]: [phone] CourtSuSanteerEast [place] [address]hone: [phone]

## 2016-12-27 ENCOUNTER — Ambulatory Visit (INDEPENDENT_AMBULATORY_CARE_PROVIDER_SITE_OTHER): Payer: Medicare Other | Admitting: Student

## 2016-12-27 ENCOUNTER — Encounter: Payer: Self-pay | Admitting: Student

## 2016-12-27 VITALS — BP 155/70 | HR 70 | Ht 63.0 in | Wt 104.4 lb

## 2016-12-27 DIAGNOSIS — J9621 Acute and chronic respiratory failure with hypoxia: Secondary | ICD-10-CM | POA: Diagnosis not present

## 2016-12-27 DIAGNOSIS — I10 Essential (primary) hypertension: Secondary | ICD-10-CM | POA: Diagnosis not present

## 2016-12-27 DIAGNOSIS — I351 Nonrheumatic aortic (valve) insufficiency: Secondary | ICD-10-CM | POA: Diagnosis not present

## 2016-12-27 DIAGNOSIS — R002 Palpitations: Secondary | ICD-10-CM

## 2016-12-27 DIAGNOSIS — I5032 Chronic diastolic (congestive) heart failure: Secondary | ICD-10-CM

## 2016-12-27 DIAGNOSIS — J449 Chronic obstructive pulmonary disease, unspecified: Secondary | ICD-10-CM | POA: Diagnosis not present

## 2016-12-27 DIAGNOSIS — J439 Emphysema, unspecified: Secondary | ICD-10-CM | POA: Diagnosis not present

## 2016-12-27 NOTE — Patient Instructions (Signed)
Medication Instructions: Your physician recommends that you continue on your current medications as directed. Please refer to the Current Medication list given to you today.   Follow-Up: Your physician wants you to follow-up in: 6 months with Dr. Stanford Breed. You will receive a reminder letter in the mail two months in advance. If you don't receive a letter, please call our office to schedule the follow-up appointment.   Any Other Special Instructions will be listed below:  Please call if you experience worsening palpitations--[phone].   If you need a refill on your cardiac medications before your next appointment, please call your pharmacy.

## 2017-01-03 DIAGNOSIS — J449 Chronic obstructive pulmonary disease, unspecified: Secondary | ICD-10-CM | POA: Diagnosis not present

## 2017-01-04 ENCOUNTER — Other Ambulatory Visit: Payer: Self-pay | Admitting: Physician Assistant

## 2017-01-06 DIAGNOSIS — H04123 Dry eye syndrome of bilateral lacrimal glands: Secondary | ICD-10-CM | POA: Diagnosis not present

## 2017-01-07 ENCOUNTER — Telehealth: Payer: Self-pay | Admitting: Student

## 2017-01-07 NOTE — Telephone Encounter (Signed)
Agree; if symptoms recur, event monitor. Kirk Ruths

## 2017-01-07 NOTE — Telephone Encounter (Signed)
Please call,pt says she is still having episodes where her heart is beating fast. She had 2 episodes already this morning.

## 2017-01-07 NOTE — Telephone Encounter (Signed)
Returned call to patient she stated she had episode of fast heart beat yesterday.Stated she had 2nd episode of fast heart beat that woke her up this morning.No fast heart beat at present.Stated she recently saw [place] PA who advised if she continued to have fast heart beat she would increase Bisoprolol/HCTZ.She is presently taking 2.5/6.25mg.After reviewing Brittany's 12/27/16 note she advised ok to increase Bisoprolol/HCTZ to 5/6.25 mg daily.Advised I will send in new prescription to pharmacy.Advised to call back if she continues to have fast heart beat.

## 2017-01-11 ENCOUNTER — Telehealth: Payer: Self-pay | Admitting: Student

## 2017-01-11 MED ORDER — BISOPROLOL-HYDROCHLOROTHIAZIDE 5-6.25 MG PO TABS
1.0000 | ORAL_TABLET | Freq: Every day | ORAL | 3 refills | Status: DC
Start: 2017-01-11 — End: 2017-04-12

## 2017-01-11 NOTE — Telephone Encounter (Signed)
New message    Pt is calling about her medication. She has a question and requesting a call.

## 2017-01-11 NOTE — Telephone Encounter (Signed)
Spoke to patient-she is currently taking bisoprolol-hctz 2.84m-6.25 mg one tablet daily.  (New rx not sent in for increased dose on 4/13).     New rx sent to preferred pharmacy for increased dose-patient aware and verbalized understanding.  Aware to let uKoreaknow if increased dose does not help symptoms.

## 2017-01-11 NOTE — Telephone Encounter (Signed)
Returned call to patient-reports she had OV with Bernerd Pho PA 3/28-was advised to call back if she continued to have palpitations or palpitations increased.  Reports she had had 3 episodes since visit-reports they last approximately 4 mins and will decrease when she gets up to walk around and cough.  BP has been running [id number] systolic.  Wondering if she needs to increase her medication that was discussed at [place].  OV NOTE:  We discussed that if she has recurrent palpitations, we can increase her Ziac from 2.5-6.25mg daily to 5.0-6.25mg daily. She will call if she has recurrent symptoms.    Advised I would route to [place] PA for review.

## 2017-01-11 NOTE — Telephone Encounter (Signed)
Telephone encounter from 01/07/2017 states she was advised to increase it at that time. Please confirm this with her. Would continue on increased dosing of 5.0/6.25mg daily. If still having palpitations 2 weeks out from increase, would consider a repeat event monitor at that time. Thanks.

## 2017-01-13 ENCOUNTER — Other Ambulatory Visit: Payer: Self-pay | Admitting: *Deleted

## 2017-01-20 ENCOUNTER — Other Ambulatory Visit: Payer: Self-pay | Admitting: Internal Medicine

## 2017-01-20 NOTE — Telephone Encounter (Signed)
Please call Andrea [name]

## 2017-01-24 ENCOUNTER — Ambulatory Visit (INDEPENDENT_AMBULATORY_CARE_PROVIDER_SITE_OTHER): Payer: Medicare Other | Admitting: Internal Medicine

## 2017-01-24 ENCOUNTER — Encounter: Payer: Self-pay | Admitting: Internal Medicine

## 2017-01-24 VITALS — BP 138/68 | HR 72 | Temp 97.7°F | Resp 16 | Ht 63.0 in | Wt 102.6 lb

## 2017-01-24 DIAGNOSIS — E039 Hypothyroidism, unspecified: Secondary | ICD-10-CM

## 2017-01-24 DIAGNOSIS — R7303 Prediabetes: Secondary | ICD-10-CM

## 2017-01-24 DIAGNOSIS — Z79899 Other long term (current) drug therapy: Secondary | ICD-10-CM | POA: Diagnosis not present

## 2017-01-24 DIAGNOSIS — E782 Mixed hyperlipidemia: Secondary | ICD-10-CM | POA: Diagnosis not present

## 2017-01-24 DIAGNOSIS — E559 Vitamin D deficiency, unspecified: Secondary | ICD-10-CM

## 2017-01-24 DIAGNOSIS — I10 Essential (primary) hypertension: Secondary | ICD-10-CM

## 2017-01-24 NOTE — Progress Notes (Signed)
This very nice 83 y.o. WWF presents for 3 month follow up with Hypertension, Hyperlipidemia, Pre-Diabetes, Hypothyroidism  and Vitamin D Deficiency.      Patient is treated for HTN (1990's)  & BP has been controlled at home. Today's BP was sl elevated and normalized on recheck.  138/68. Patient has had no complaints of any cardiac type chest pain, palpitations, dyspnea/orthopnea/PND, dizziness, claudication, or dependent edema.     Hyperlipidemia is controlled with diet & has hx/o statin intolerance. Patient denies myalgias or other med SE's. Last Lipids were at goal with very high HDL of 113 and LDL 94: Lab Results  Component Value Date   CHOL 226 (H) 10/04/2016   HDL 113 10/04/2016   LDLCALC 94 10/04/2016   TRIG 94 10/04/2016   CHOLHDL 2.0 10/04/2016      Pqatient has been on Thyroid replacment since 2014. Also, the patient has history of PreDiabetes (2012) and has had no symptoms of reactive hypoglycemia, diabetic polys, paresthesias or visual blurring.  Last A1c was not at goal: Lab Results  Component Value Date   HGBA1C 5.9 (H) 04/07/2016      Further, the patient also has history of Vitamin D Deficiency ("39" on treatment in 2009) and supplements vitamin D without any suspected side-effects. Last vitamin D was at goal:  Lab Results  Component Value Date   VD25OH 68 10/04/2016   Current Outpatient Prescriptions on File Prior to Visit  Medication Sig  . ALPRAZolam  0.5 MG tablet TAKE 1/2 TO ONE TABLET THREE TIMES DAILYFOR NERVES  . amLODipine  10 MG tablet Take 5 mg by mouth daily.   [name] VITAMIN C 500 MG Take 500 mg by mouth daily.    . bisoprolol-hctz 5-6.25  Take 1 tablet by mouth daily.  [name] VITAMIN D 1000 UNITS  Take 1,000 Units by mouth daily.   . furosemide 20 MG TAKE ONE TAB DAILY  . DUONEB Take 3 mLs by neb every 6 hrs as needed.  [name] levothyroxine  50 MCG  TAKE 1 TAB DAILY OR AS DIRECTED  . losartan 100 MG  Take 0.5 tab by mouth daily.  [name] omeprazole  40 MG  TAKE 1 CAP  EACH DAY  . triamcinolone crm 0.1 % Apply 1 application topically 2 (two) times daily.   Allergies  Allergen Reactions  . Vancomycin     Worsening kidney function  . Sulfa Antibiotics Palpitations    Unknown   PMHx:   Past Medical History:  Diagnosis Date  . Aortic regurgitation   . Bronchiectasis ([place])   . COPD (chronic obstructive pulmonary disease) ([place])   . DJD (degenerative joint disease)   . HLD (hyperlipidemia)   . HTN (hypertension)   . Mild mitral regurgitation by prior echocardiogram   . Mild tricuspid regurgitation   . Palpitations    a. has known history of PAC's and PVC's. b. 09/2015: monitor showed sinus rhythm with occasional PAC's and a brief episode of PAT.  [name] Thyroid disease    Immunization History  Administered Date(s) Administered  . Influenza Split 07/16/2013  . Influenza, High Dose Seasonal PF 06/25/2015, 06/10/2016  . Influenza,inj,Quad PF,36+ Mos 07/04/2014  . Pneumococcal Conjugate-13 02/26/2014  . Pneumococcal Polysaccharide-23 10/04/2016  . Td 06/26/2013   Past Surgical History:  Procedure Laterality Date  . ABDOMINAL HYSTERECTOMY  1993  . CATARACT EXTRACTION, BILATERAL     FHx:    Reviewed / unchanged  SHx:    Reviewed / unchanged  Systems Review:  Constitutional: Denies fever, chills, wt changes, headaches, insomnia, fatigue, night sweats, change in appetite. Eyes: Denies redness, blurred vision, diplopia, discharge, itchy, watery eyes.  ENT: Denies discharge, congestion, post nasal drip, epistaxis, sore throat, earache, hearing loss, dental pain, tinnitus, vertigo, sinus pain, snoring.  CV: Denies chest pain, palpitations, irregular heartbeat, syncope, dyspnea, diaphoresis, orthopnea, PND, claudication or edema. Respiratory: denies cough, dyspnea, DOE, pleurisy, hoarseness, laryngitis, wheezing.  Gastrointestinal: Denies dysphagia, odynophagia, heartburn, reflux, water brash, abdominal pain or cramps, nausea, vomiting, bloating, diarrhea,  constipation, hematemesis, melena, hematochezia  or hemorrhoids. Genitourinary: Denies dysuria, frequency, urgency, nocturia, hesitancy, discharge, hematuria or flank pain. Musculoskeletal: Denies arthralgias, myalgias, stiffness, jt. swelling, pain, limping or strain/sprain.  Skin: Denies pruritus, rash, hives, warts, acne, eczema or change in skin lesion(s). Neuro: No weakness, tremor, incoordination, spasms, paresthesia or pain. Psychiatric: Denies confusion, memory loss or sensory loss. Endo: Denies change in weight, skin or hair change.  Heme/Lymph: No excessive bleeding, bruising or enlarged lymph nodes.  Physical Exam  BP 138/68   Pulse 72   Temp 97.7 F (36.5 C)   Resp 16   Ht _0  (1.6 m)   Wt 102 lb 9.6 oz (46.5 kg)   BMI 18.17 kg/m   Appears well nourished, well groomed  and in no distress.  Eyes: PERRLA, EOMs, conjunctiva no swelling or erythema. Sinuses: No frontal/maxillary tenderness ENT/Mouth: EAC's clear, TM's nl w/o erythema, bulging. Nares clear w/o erythema, swelling, exudates. Oropharynx clear without erythema or exudates. Oral hygiene is good. Tongue normal, non obstructing. Hearing intact.  Neck: Supple. Thyroid nl. Car 2+/2+ without bruits, nodes or JVD. Chest: Respirations nl with BS clear & equal w/o rales, rhonchi, wheezing or stridor.  Cor: Heart sounds normal w/ regular rate and rhythm without sig. murmurs, gallops, clicks or rubs. Peripheral pulses normal and equal  without edema.  Abdomen: Soft & bowel sounds normal. Non-tender w/o guarding, rebound, hernias, masses or organomegaly.  Lymphatics: Unremarkable.  Musculoskeletal: Full ROM all peripheral extremities, joint stability, 5/5 strength and normal gait.  Skin: Warm, dry without exposed rashes, lesions or ecchymosis apparent.  Neuro: Cranial nerves intact, reflexes equal bilaterally. Sensory-motor testing grossly intact. Tendon reflexes grossly intact.  Pysch: Alert & oriented x 3.  Insight and  judgement nl & appropriate. No ideations.  Assessment and Plan:  1. Essential hypertension  - Continue medication, monitor blood pressure at home.  - Continue DASH diet. Reminder to go to the ER if any CP,  SOB, nausea, dizziness, severe HA, changes vision/speech,  left arm numbness and tingling and jaw pain.  - CBC with Differential/Platelet - BASIC METABOLIC PANEL WITH GFR - Magnesium - TSH  2. Hyperlipidemia, mixed  - Continue diet/meds, exercise,& lifestyle modifications.  - Continue monitor periodic cholesterol/liver & renal functions  - Hepatic function panel - Lipid panel - TSH  3. Prediabetes  - Continue diet, exercise, lifestyle modifications.  - Monitor appropriate labs.  - Hemoglobin A1c - Insulin, random  4. Vitamin D deficiency  - Continue supplementation.  - VITAMIN D 25 Hydroxy   5. Hypothyroidism  - TSH  6. Medication management  - CBC with Differential/Platelet - BASIC METABOLIC PANEL WITH GFR - Hepatic function panel - Magnesium - Lipid panel - TSH - Hemoglobin A1c - Insulin, random - VITAMIN D 25 Hydroxy       Discussed  regular exercise, BP monitoring, weight control to achieve/maintain BMI less than 25 and discussed med and SE's. Recommended labs to assess and monitor  clinical status with further disposition pending results of labs. Over 30 minutes of exam, counseling, chart review was performed.

## 2017-01-24 NOTE — Patient Instructions (Signed)
Recommend Adult Low Dose Aspirin or  coated  Aspirin 81 mg daily  To reduce risk of Colon Cancer 20 %,  Skin Cancer 26 % ,  Melanoma 46%  and  Pancreatic cancer 60% +++++++++++++++++++++++++ Vitamin D goal  is between 70-100.  Please make sure that you are taking your Vitamin D as directed.  It is very important as a natural anti-inflammatory  helping hair, skin, and nails, as well as reducing stroke and heart attack risk.  It helps your bones and helps with mood. It also decreases numerous cancer risks so please take it as directed.  Low Vit D is associated with a 200-300% higher risk for CANCER  and 200-300% higher risk for HEART   ATTACK  &  STROKE.   .....................................[name] It is also associated with higher death rate at younger ages,  autoimmune diseases like Rheumatoid arthritis, Lupus, Multiple Sclerosis.    Also many other serious conditions, like depression, Alzheimer's Dementia, infertility, muscle aches, fatigue, fibromyalgia - just to name a few. ++++++++++++++++++++ Recommend the book "The END of DIETING" by Dr Excell Seltzer  & the book "The END of DIABETES " by Dr Excell Seltzer At [email or web address] - get book & Audio CD's    Being diabetic has a  300% increased risk for heart attack, stroke, cancer, and alzheimer- type vascular dementia. It is very important that you work harder with diet by avoiding all foods that are white. Avoid white rice (brown & wild rice is OK), white potatoes (sweetpotatoes in moderation is OK), White bread or wheat bread or anything made out of white flour like bagels, donuts, rolls, buns, biscuits, cakes, pastries, cookies, pizza crust, and pasta (made from white flour & egg whites) - vegetarian pasta or spinach or wheat pasta is OK. Multigrain breads like Arnold's or Pepperidge Farm, or multigrain sandwich thins or flatbreads.  Diet, exercise and weight loss can reverse and cure diabetes in the early stages.  Diet, exercise and weight loss is  very important in the control and prevention of complications of diabetes which affects every system in your body, ie. Brain - dementia/stroke, eyes - glaucoma/blindness, heart - heart attack/heart failure, kidneys - dialysis, stomach - gastric paralysis, intestines - malabsorption, nerves - severe painful neuritis, circulation - gangrene & loss of a leg(s), and finally cancer and Alzheimers.    I recommend avoid fried & greasy foods,  sweets/candy, white rice (brown or wild rice or Quinoa is OK), white potatoes (sweet potatoes are OK) - anything made from white flour - bagels, doughnuts, rolls, buns, biscuits,white and wheat breads, pizza crust and traditional pasta made of white flour & egg white(vegetarian pasta or spinach or wheat pasta is OK).  Multi-grain bread is OK - like multi-grain flat bread or sandwich thins. Avoid alcohol in excess. Exercise is also important.    Eat all the vegetables you want - avoid meat, especially red meat and dairy - especially cheese.  Cheese is the most concentrated form of trans-fats which is the worst thing to clog up our arteries. Veggie cheese is OK which can be found in the fresh produce section at [employer] or [employer] or [employer]  +++++++++++++++++++++ DASH Eating Plan  DASH stands for "Dietary Approaches to Stop Hypertension."   The DASH eating plan is a healthy eating plan that has been shown to reduce high blood pressure (hypertension). Additional health benefits may include reducing the risk of type 2 diabetes mellitus, heart disease, and stroke. The DASH eating plan may also  help with weight loss. WHAT DO I NEED TO KNOW ABOUT THE DASH EATING PLAN? For the DASH eating plan, you will follow these general guidelines:  Choose foods with a percent daily value for sodium of less than 5% (as listed on the food label).  Use salt-free seasonings or herbs instead of table salt or sea salt.  Check with your health care provider or pharmacist before  using salt substitutes.  Eat lower-sodium products, often labeled as "lower sodium" or "no salt added."  Eat fresh foods.  Eat more vegetables, fruits, and low-fat dairy products.  Choose whole grains. Look for the word "whole" as the first word in the ingredient list.  Choose fish   Limit sweets, desserts, sugars, and sugary drinks.  Choose heart-healthy fats.  Eat veggie cheese   Eat more home-cooked food and less restaurant, buffet, and fast food.  Limit fried foods.  Cook foods using methods other than frying.  Limit canned vegetables. If you do use them, rinse them well to decrease the sodium.  When eating at a restaurant, ask that your food be prepared with less salt, or no salt if possible.                      WHAT FOODS CAN I EAT? Read Dr Fara Olden Fuhrman's books on The End of Dieting & The End of Diabetes  Grains Whole grain or whole wheat bread. Brown rice. Whole grain or whole wheat pasta. Quinoa, bulgur, and whole grain cereals. Low-sodium cereals. Corn or whole wheat flour tortillas. Whole grain cornbread. Whole grain crackers. Low-sodium crackers.  Vegetables Fresh or frozen vegetables (raw, steamed, roasted, or grilled). Low-sodium or reduced-sodium tomato and vegetable juices. Low-sodium or reduced-sodium tomato sauce and paste. Low-sodium or reduced-sodium canned vegetables.   Fruits All fresh, canned (in natural juice), or frozen fruits.  Protein Products  All fish and seafood.  Dried beans, peas, or lentils. Unsalted nuts and seeds. Unsalted canned beans.  Dairy Low-fat dairy products, such as skim or 1% milk, 2% or reduced-fat cheeses, low-fat ricotta or cottage cheese, or plain low-fat yogurt. Low-sodium or reduced-sodium cheeses.  Fats and Oils Tub margarines without trans fats. Light or reduced-fat mayonnaise and salad dressings (reduced sodium). Avocado. Safflower, olive, or canola oils. Natural peanut or almond butter.  Other Unsalted popcorn  and pretzels. The items listed above may not be a complete list of recommended foods or beverages. Contact your dietitian for more options.  +++++++++++++++  WHAT FOODS ARE NOT RECOMMENDED? Grains/ White flour or wheat flour White bread. White pasta. White rice. Refined cornbread. Bagels and croissants. Crackers that contain trans fat.  Vegetables  Creamed or fried vegetables. Vegetables in a . Regular canned vegetables. Regular canned tomato sauce and paste. Regular tomato and vegetable juices.  Fruits Dried fruits. Canned fruit in light or heavy syrup. Fruit juice.  Meat and Other Protein Products Meat in general - RED meat & White meat.  Fatty cuts of meat. Ribs, chicken wings, all processed meats as bacon, sausage, bologna, salami, fatback, hot dogs, bratwurst and packaged luncheon meats.  Dairy Whole or 2% milk, cream, half-and-half, and cream cheese. Whole-fat or sweetened yogurt. Full-fat cheeses or blue cheese. Non-dairy creamers and whipped toppings. Processed cheese, cheese spreads, or cheese curds.  Condiments Onion and garlic salt, seasoned salt, table salt, and sea salt. Canned and packaged gravies. Worcestershire sauce. Tartar sauce. Barbecue sauce. Teriyaki sauce. Soy sauce, including reduced sodium. Steak sauce. Fish sauce. Oyster sauce. Cocktail  sauce. Horseradish. Ketchup and mustard. Meat flavorings and tenderizers. Bouillon cubes. Hot sauce. Tabasco sauce. Marinades. Taco seasonings. Relishes.  Fats and Oils Butter, stick margarine, lard, shortening and bacon fat. Coconut, palm kernel, or palm oils. Regular salad dressings.  Pickles and olives. Salted popcorn and pretzels.  The items listed above may not be a complete list of foods and beverages to avoid.

## 2017-01-25 ENCOUNTER — Encounter: Payer: Self-pay | Admitting: *Deleted

## 2017-01-25 LAB — BASIC METABOLIC PANEL WITH GFR
BUN: 27 mg/dL — ABNORMAL HIGH (ref 7–25)
CALCIUM: 9.8 mg/dL (ref 8.6–10.4)
CO2: 33 mmol/L — ABNORMAL HIGH (ref 20–31)
Chloride: 98 mmol/L (ref 98–110)
Creat: 1.08 mg/dL — ABNORMAL HIGH (ref 0.60–0.88)
GFR, EST AFRICAN AMERICAN: 54 mL/min — AB (ref >=60)
GFR, EST NON AFRICAN AMERICAN: 47 mL/min — AB (ref >=60)
Glucose, Bld: 87 mg/dL (ref 65–99)
POTASSIUM: 4.4 mmol/L (ref 3.5–5.3)
Sodium: 139 mmol/L (ref 135–146)

## 2017-01-25 LAB — CBC WITH DIFFERENTIAL/PLATELET
Basophils Absolute: 0 cells/uL (ref 0–200)
Basophils Relative: 0 %
EOS PCT: 2 %
Eosinophils Absolute: 128 cells/uL (ref 15–500)
HCT: 38.4 % (ref 35.0–45.0)
Hemoglobin: 12.2 g/dL (ref 11.7–15.5)
LYMPHS PCT: 29 %
Lymphs Abs: 1856 cells/uL (ref 850–3900)
MCH: 29.4 pg (ref 27.0–33.0)
MCHC: 31.8 g/dL — AB (ref 32.0–36.0)
MCV: 92.5 fL (ref 80.0–100.0)
MONOS PCT: 8 %
MPV: 11.5 fL (ref 7.5–12.5)
Monocytes Absolute: 512 cells/uL (ref 200–950)
Neutro Abs: 3904 cells/uL (ref 1500–7800)
Neutrophils Relative %: 61 %
PLATELETS: 217 10*3/uL (ref 140–400)
RBC: 4.15 MIL/uL (ref 3.80–5.10)
RDW: 13 % (ref 11.0–15.0)
WBC: 6.4 10*3/uL (ref 3.8–10.8)

## 2017-01-25 LAB — HEPATIC FUNCTION PANEL
ALBUMIN: 4.2 g/dL (ref 3.6–5.1)
ALK PHOS: 72 U/L (ref 33–130)
ALT: 8 U/L (ref 6–29)
AST: 20 U/L (ref 10–35)
BILIRUBIN INDIRECT: 0.4 mg/dL (ref 0.2–1.2)
BILIRUBIN TOTAL: 0.5 mg/dL (ref 0.2–1.2)
Bilirubin, Direct: 0.1 mg/dL (ref <=0.2)
Total Protein: 7.3 g/dL (ref 6.1–8.1)

## 2017-01-25 LAB — HEMOGLOBIN A1C
Hgb A1c MFr Bld: 5.4 % (ref <5.7)
MEAN PLASMA GLUCOSE: 108 mg/dL

## 2017-01-25 LAB — LIPID PANEL
CHOLESTEROL: 224 mg/dL — AB (ref <200)
HDL: 106 mg/dL (ref >50)
LDL Cholesterol: 104 mg/dL — ABNORMAL HIGH (ref <100)
TRIGLYCERIDES: 72 mg/dL (ref <150)
Total CHOL/HDL Ratio: 2.1 Ratio (ref <5.0)
VLDL: 14 mg/dL (ref <30)

## 2017-01-25 LAB — VITAMIN D 25 HYDROXY (VIT D DEFICIENCY, FRACTURES): VIT D 25 HYDROXY: 52 ng/mL (ref 30–100)

## 2017-01-25 LAB — MAGNESIUM: Magnesium: 2 mg/dL (ref 1.5–2.5)

## 2017-01-25 LAB — INSULIN, RANDOM: INSULIN: 3.8 u[IU]/mL (ref 2.0–19.6)

## 2017-01-25 LAB — TSH: TSH: 1.41 mIU/L

## 2017-01-26 DIAGNOSIS — J9621 Acute and chronic respiratory failure with hypoxia: Secondary | ICD-10-CM | POA: Diagnosis not present

## 2017-01-26 DIAGNOSIS — J439 Emphysema, unspecified: Secondary | ICD-10-CM | POA: Diagnosis not present

## 2017-02-02 DIAGNOSIS — J449 Chronic obstructive pulmonary disease, unspecified: Secondary | ICD-10-CM | POA: Diagnosis not present

## 2017-02-04 DIAGNOSIS — H04123 Dry eye syndrome of bilateral lacrimal glands: Secondary | ICD-10-CM | POA: Diagnosis not present

## 2017-02-24 DIAGNOSIS — H5203 Hypermetropia, bilateral: Secondary | ICD-10-CM | POA: Diagnosis not present

## 2017-03-05 DIAGNOSIS — J449 Chronic obstructive pulmonary disease, unspecified: Secondary | ICD-10-CM | POA: Diagnosis not present

## 2017-03-30 ENCOUNTER — Inpatient Hospital Stay (HOSPITAL_COMMUNITY)
Admission: EM | Admit: 2017-03-30 | Discharge: 2017-04-12 | DRG: 388 | Disposition: A | Discharge status: Skilled Nursing Facility | Payer: Medicare Other | Attending: Internal Medicine | Admitting: Internal Medicine

## 2017-03-30 ENCOUNTER — Observation Stay (HOSPITAL_COMMUNITY): Payer: Medicare Other

## 2017-03-30 ENCOUNTER — Emergency Department (HOSPITAL_COMMUNITY): Payer: Medicare Other

## 2017-03-30 ENCOUNTER — Encounter (HOSPITAL_COMMUNITY): Payer: Self-pay | Admitting: *Deleted

## 2017-03-30 DIAGNOSIS — R1312 Dysphagia, oropharyngeal phase: Secondary | ICD-10-CM | POA: Diagnosis not present

## 2017-03-30 DIAGNOSIS — Z881 Allergy status to other antibiotic agents status: Secondary | ICD-10-CM | POA: Diagnosis not present

## 2017-03-30 DIAGNOSIS — Z978 Presence of other specified devices: Secondary | ICD-10-CM

## 2017-03-30 DIAGNOSIS — J690 Pneumonitis due to inhalation of food and vomit: Secondary | ICD-10-CM | POA: Diagnosis not present

## 2017-03-30 DIAGNOSIS — I5032 Chronic diastolic (congestive) heart failure: Secondary | ICD-10-CM | POA: Diagnosis not present

## 2017-03-30 DIAGNOSIS — I5033 Acute on chronic diastolic (congestive) heart failure: Secondary | ICD-10-CM | POA: Diagnosis present

## 2017-03-30 DIAGNOSIS — I481 Persistent atrial fibrillation: Secondary | ICD-10-CM | POA: Diagnosis present

## 2017-03-30 DIAGNOSIS — Z882 Allergy status to sulfonamides status: Secondary | ICD-10-CM

## 2017-03-30 DIAGNOSIS — Z4682 Encounter for fitting and adjustment of non-vascular catheter: Secondary | ICD-10-CM | POA: Diagnosis not present

## 2017-03-30 DIAGNOSIS — J9611 Chronic respiratory failure with hypoxia: Secondary | ICD-10-CM | POA: Diagnosis present

## 2017-03-30 DIAGNOSIS — J439 Emphysema, unspecified: Secondary | ICD-10-CM | POA: Diagnosis present

## 2017-03-30 DIAGNOSIS — E43 Unspecified severe protein-calorie malnutrition: Secondary | ICD-10-CM | POA: Diagnosis not present

## 2017-03-30 DIAGNOSIS — I110 Hypertensive heart disease with heart failure: Secondary | ICD-10-CM | POA: Diagnosis present

## 2017-03-30 DIAGNOSIS — J449 Chronic obstructive pulmonary disease, unspecified: Secondary | ICD-10-CM | POA: Diagnosis not present

## 2017-03-30 DIAGNOSIS — I083 Combined rheumatic disorders of mitral, aortic and tricuspid valves: Secondary | ICD-10-CM | POA: Diagnosis not present

## 2017-03-30 DIAGNOSIS — R1110 Vomiting, unspecified: Secondary | ICD-10-CM

## 2017-03-30 DIAGNOSIS — I10 Essential (primary) hypertension: Secondary | ICD-10-CM | POA: Diagnosis not present

## 2017-03-30 DIAGNOSIS — I4892 Unspecified atrial flutter: Secondary | ICD-10-CM | POA: Diagnosis present

## 2017-03-30 DIAGNOSIS — E038 Other specified hypothyroidism: Secondary | ICD-10-CM | POA: Diagnosis not present

## 2017-03-30 DIAGNOSIS — E871 Hypo-osmolality and hyponatremia: Secondary | ICD-10-CM | POA: Diagnosis present

## 2017-03-30 DIAGNOSIS — R059 Cough, unspecified: Secondary | ICD-10-CM

## 2017-03-30 DIAGNOSIS — I471 Supraventricular tachycardia: Secondary | ICD-10-CM | POA: Diagnosis not present

## 2017-03-30 DIAGNOSIS — Z681 Body mass index (BMI) 19 or less, adult: Secondary | ICD-10-CM

## 2017-03-30 DIAGNOSIS — Z79899 Other long term (current) drug therapy: Secondary | ICD-10-CM | POA: Diagnosis not present

## 2017-03-30 DIAGNOSIS — K565 Intestinal adhesions [bands], unspecified as to partial versus complete obstruction: Secondary | ICD-10-CM | POA: Diagnosis not present

## 2017-03-30 DIAGNOSIS — J438 Other emphysema: Secondary | ICD-10-CM | POA: Diagnosis not present

## 2017-03-30 DIAGNOSIS — E039 Hypothyroidism, unspecified: Secondary | ICD-10-CM | POA: Diagnosis present

## 2017-03-30 DIAGNOSIS — R0902 Hypoxemia: Secondary | ICD-10-CM | POA: Diagnosis not present

## 2017-03-30 DIAGNOSIS — R109 Unspecified abdominal pain: Secondary | ICD-10-CM | POA: Diagnosis not present

## 2017-03-30 DIAGNOSIS — E785 Hyperlipidemia, unspecified: Secondary | ICD-10-CM | POA: Diagnosis not present

## 2017-03-30 DIAGNOSIS — I498 Other specified cardiac arrhythmias: Secondary | ICD-10-CM | POA: Diagnosis not present

## 2017-03-30 DIAGNOSIS — E876 Hypokalemia: Secondary | ICD-10-CM | POA: Diagnosis not present

## 2017-03-30 DIAGNOSIS — J9610 Chronic respiratory failure, unspecified whether with hypoxia or hypercapnia: Secondary | ICD-10-CM | POA: Diagnosis not present

## 2017-03-30 DIAGNOSIS — Z87891 Personal history of nicotine dependence: Secondary | ICD-10-CM | POA: Diagnosis not present

## 2017-03-30 DIAGNOSIS — F419 Anxiety disorder, unspecified: Secondary | ICD-10-CM | POA: Diagnosis present

## 2017-03-30 DIAGNOSIS — I4719 Other supraventricular tachycardia: Secondary | ICD-10-CM | POA: Diagnosis present

## 2017-03-30 DIAGNOSIS — I509 Heart failure, unspecified: Secondary | ICD-10-CM | POA: Diagnosis not present

## 2017-03-30 DIAGNOSIS — R112 Nausea with vomiting, unspecified: Secondary | ICD-10-CM | POA: Diagnosis not present

## 2017-03-30 DIAGNOSIS — K56609 Unspecified intestinal obstruction, unspecified as to partial versus complete obstruction: Secondary | ICD-10-CM | POA: Diagnosis present

## 2017-03-30 DIAGNOSIS — I480 Paroxysmal atrial fibrillation: Secondary | ICD-10-CM | POA: Diagnosis not present

## 2017-03-30 DIAGNOSIS — R05 Cough: Secondary | ICD-10-CM

## 2017-03-30 LAB — COMPREHENSIVE METABOLIC PANEL
ALT: 12 U/L — AB (ref 14–54)
AST: 30 U/L (ref 15–41)
Albumin: 4.2 g/dL (ref 3.5–5.0)
Alkaline Phosphatase: 75 U/L (ref 38–126)
Anion gap: 13 (ref 5–15)
BUN: 31 mg/dL — ABNORMAL HIGH (ref 6–20)
CHLORIDE: 91 mmol/L — AB (ref 101–111)
CO2: 30 mmol/L (ref 22–32)
CREATININE: 1.05 mg/dL — AB (ref 0.44–1.00)
Calcium: 9.9 mg/dL (ref 8.9–10.3)
GFR calc non Af Amer: 47 mL/min — ABNORMAL LOW (ref >60)
GFR, EST AFRICAN AMERICAN: 54 mL/min — AB (ref >60)
Glucose, Bld: 122 mg/dL — ABNORMAL HIGH (ref 65–99)
Potassium: 4 mmol/L (ref 3.5–5.1)
Sodium: 134 mmol/L — ABNORMAL LOW (ref 135–145)
Total Bilirubin: 1 mg/dL (ref 0.3–1.2)
Total Protein: 8.3 g/dL — ABNORMAL HIGH (ref 6.5–8.1)

## 2017-03-30 LAB — CBC
HEMATOCRIT: 40.8 % (ref 36.0–46.0)
HEMOGLOBIN: 13.4 g/dL (ref 12.0–15.0)
MCH: 30.2 pg (ref 26.0–34.0)
MCHC: 32.8 g/dL (ref 30.0–36.0)
MCV: 92.1 fL (ref 78.0–100.0)
Platelets: 269 10*3/uL (ref 150–400)
RBC: 4.43 MIL/uL (ref 3.87–5.11)
RDW: 12.5 % (ref 11.5–15.5)
WBC: 13.6 10*3/uL — ABNORMAL HIGH (ref 4.0–10.5)

## 2017-03-30 LAB — URINALYSIS, ROUTINE W REFLEX MICROSCOPIC
BACTERIA UA: NONE SEEN
BILIRUBIN URINE: NEGATIVE
Glucose, UA: NEGATIVE mg/dL
Hgb urine dipstick: NEGATIVE
Ketones, ur: 5 mg/dL — AB
Leukocytes, UA: NEGATIVE
NITRITE: NEGATIVE
Protein, ur: 30 mg/dL — AB
SPECIFIC GRAVITY, URINE: 1.015 (ref 1.005–1.030)
Squamous Epithelial / LPF: NONE SEEN
pH: 7 (ref 5.0–8.0)

## 2017-03-30 LAB — LIPASE, BLOOD: Lipase: 47 U/L (ref 11–51)

## 2017-03-30 MED ORDER — ONDANSETRON HCL 4 MG/2ML IJ SOLN
4.0000 mg | Freq: Once | INTRAMUSCULAR | Status: AC
  Administered 2017-03-30: 4 mg via INTRAVENOUS
  Filled 2017-03-30: qty 2

## 2017-03-30 MED ORDER — LIDOCAINE HCL 2 % EX GEL
1.0000 "application " | Freq: Once | CUTANEOUS | Status: AC
  Administered 2017-03-30: 1 via TOPICAL
  Filled 2017-03-30: qty 10

## 2017-03-30 MED ORDER — ONDANSETRON HCL 4 MG/2ML IJ SOLN
4.0000 mg | Freq: Once | INTRAMUSCULAR | Status: AC | PRN
  Administered 2017-03-30: 4 mg via INTRAVENOUS

## 2017-03-30 MED ORDER — IOPAMIDOL (ISOVUE-300) INJECTION 61%
INTRAVENOUS | Status: AC
  Administered 2017-03-30: 30 mL
  Filled 2017-03-30: qty 30

## 2017-03-30 MED ORDER — ONDANSETRON HCL 4 MG/2ML IJ SOLN
INTRAMUSCULAR | Status: AC
  Filled 2017-03-30: qty 2

## 2017-03-30 MED ORDER — LORAZEPAM 2 MG/ML IJ SOLN
0.5000 mg | Freq: Once | INTRAMUSCULAR | Status: AC
  Administered 2017-03-30: 0.5 mg via INTRAVENOUS
  Filled 2017-03-30: qty 1

## 2017-03-30 MED ORDER — IOPAMIDOL (ISOVUE-300) INJECTION 61%
100.0000 mL | Freq: Once | INTRAVENOUS | Status: AC | PRN
  Administered 2017-03-30: 100 mL via INTRAVENOUS

## 2017-03-30 NOTE — ED Provider Notes (Signed)
[facility] DEPT Provider Note   CSN: [id number] Arrival date & time: 03/30/17  1715     History   Chief Complaint Chief Complaint  Patient presents with  . Abdominal Pain    HPI Andrea [name] is a 83 y.o. female.  Patient complains of abdominal distention and vomiting today   The history is provided by the patient and a relative. No language interpreter was used.  Abdominal Pain   This is a new problem. The current episode started 12 to 24 hours ago. The problem occurs constantly. The problem has not changed since onset.The pain is associated with an unknown factor. The pain is located in the epigastric region. The quality of the pain is aching. Associated symptoms include vomiting. Pertinent negatives include diarrhea, frequency, hematuria and headaches.    Past Medical History:  Diagnosis Date  . Aortic regurgitation   . Bronchiectasis ([place])   . COPD (chronic obstructive pulmonary disease) ([place])   . DJD (degenerative joint disease)   . HLD (hyperlipidemia)   . HTN (hypertension)   . Mild mitral regurgitation by prior echocardiogram   . Mild tricuspid regurgitation   . Palpitations    a. has known history of PAC's and PVC's. b. 09/2015: monitor showed sinus rhythm with occasional PAC's and a brief episode of PAT.  [name] Thyroid disease     Patient Active Problem List   Diagnosis Date Noted  . Chronic diastolic CHF (congestive heart failure) ([place]) 12/27/2016  . Pulmonary hypertension due to COPD ([place]) 10/04/2016  . Bilateral pleural effusion 04/20/2016  . Hyponatremia 04/19/2016  . Renal insufficiency 04/19/2016  . Tachycardia 04/19/2016  . Skin cancer 09/25/2015  . Malnutrition ([place]) 09/25/2015  . Hypothyroidism 09/25/2015  . Osteoporosis 09/25/2015  . Aortic insufficiency 09/04/2015  . Vitamin D deficiency 01/23/2014  . Medication management 01/23/2014  . Hyperlipidemia 08/10/2013  . Prediabetes 08/10/2013  . DJD (degenerative joint disease)   . Mild  mitral regurgitation by prior echocardiogram   . Essential hypertension 05/08/2010  . COPD with emphysema ([place]) 05/08/2010  . Lung nodules 05/08/2010    Past Surgical History:  Procedure Laterality Date  . ABDOMINAL HYSTERECTOMY  1993  . CATARACT EXTRACTION, BILATERAL      OB History    No data available       Home Medications    Prior to Admission medications   Medication Sig Start Date End Date Taking? Authorizing Provider  ALPRAZolam Duanne Moron) 0.5 MG tablet TAKE 1/2 TO ONE TABLET THREE TIMES DAILYFOR NERVES 01/20/17  Yes Unk Pinto, MD  amLODipine (NORVASC) 10 MG tablet Take 5 mg by mouth daily.    Yes [provider]  Ascorbic Acid (VITAMIN C) 500 MG tablet Take 500 mg by mouth daily.     Yes [provider]  bisoprolol-hydrochlorothiazide (ZIAC) 5-6.25 MG tablet Take 1 tablet by mouth daily. 01/11/17  Yes Strader, [place] M, PA-C  cholecalciferol (VITAMIN D) 1000 UNITS tablet Take 1,000 Units by mouth daily.    Yes [provider]  furosemide (LASIX) 20 MG tablet TAKE ONE TABLET (20MG TOTAL) BY MOUTH DAILY, WITH BREAKFAST, AS NEEDED FOR FLUID (LEG SWELLING). 09/25/16  Yes Unk Pinto, MD  levothyroxine (SYNTHROID, LEVOTHROID) 50 MCG tablet TAKE 1 TABLET BY MOUTH DAILY OR AS DIRECTED 09/03/16  Yes Unk Pinto, MD  losartan (COZAAR) 100 MG tablet Take 0.5 tablets (50 mg total) by mouth daily. 04/25/16  Yes Kathie Dike, MD  Magnesium 400 MG TABS Take 200 mg by mouth daily.  Yes [provider]  omeprazole (PRILOSEC) 40 MG capsule TAKE ONE (1) CAPSULE EACH DAY 01/04/17  Yes Unk Pinto, MD  ipratropium-albuterol (DUONEB) 0.5-2.5 (3) MG/3ML SOLN Take 3 mLs by nebulization every 6 (six) hours as needed. Patient not taking: Reported on 03/30/2017 04/25/16   Kathie Dike, MD    Family History Family History  Problem Relation Age of Onset  . Emphysema Mother   . Diabetes Mother   . Cancer Father   . Emphysema Sister   .  Emphysema Sister   . Rheumatic fever Sister   . Heart attack Sister   . Diabetes Sister   . Diabetes Brother   . Asthma Brother        as a child    Social History Social History  Substance Use Topics  . Smoking status: Former Smoker    Packs/day: 1.00    Years: 20.00    Types: Cigarettes    Quit date: 09/27/1974  . Smokeless tobacco: Never Used  . Alcohol use No     Allergies   Vancomycin and Sulfa antibiotics   Review of Systems Review of Systems  Constitutional: Negative for appetite change and fatigue.  HENT: Negative for congestion, ear discharge and sinus pressure.   Eyes: Negative for discharge.  Respiratory: Negative for cough.   Cardiovascular: Negative for chest pain.  Gastrointestinal: Positive for abdominal pain and vomiting. Negative for diarrhea.  Genitourinary: Negative for frequency and hematuria.  Musculoskeletal: Negative for back pain.  Skin: Negative for rash.  Neurological: Negative for seizures and headaches.  Psychiatric/Behavioral: Negative for hallucinations.     Physical Exam Updated Vital Signs BP (!) 141/74 (BP Location: Right Arm)   Pulse 92   Temp 98.9 F (37.2 C) (Temporal)   Resp 18   Ht _0  (1.6 m)   Wt 46.3 kg (102 lb)   SpO2 92%   BMI 18.07 kg/m   Physical Exam  Constitutional: She is oriented to person, place, and time. She appears well-developed.  HENT:  Head: Normocephalic.  Eyes: Conjunctivae and EOM are normal. No scleral icterus.  Neck: Neck supple. No thyromegaly present.  Cardiovascular: Normal rate and regular rhythm.  Exam reveals no gallop and no friction rub.   No murmur heard. Pulmonary/Chest: No stridor. She has no wheezes. She has no rales. She exhibits no tenderness.  Abdominal: She exhibits no distension. There is tenderness. There is no rebound.  Abdomen distended  Musculoskeletal: Normal range of motion. She exhibits no edema.  Lymphadenopathy:    She has no cervical adenopathy.  Neurological:  She is oriented to person, place, and time. She exhibits normal muscle tone. Coordination normal.  Skin: No rash noted. No erythema.  Psychiatric: She has a normal mood and affect. Her behavior is normal.     ED Treatments / Results  Labs (all labs ordered are listed, but only abnormal results are displayed) Labs Reviewed  COMPREHENSIVE METABOLIC PANEL - Abnormal; Notable for the following:       Result Value   Sodium 134 (*)    Chloride 91 (*)    Glucose, Bld 122 (*)    BUN 31 (*)    Creatinine, Ser 1.05 (*)    Total Protein 8.3 (*)    ALT 12 (*)    GFR calc non Af Amer 47 (*)    GFR calc Af Amer 54 (*)    All other components within normal limits  CBC - Abnormal; Notable for the following:  WBC 13.6 (*)    All other components within normal limits  URINALYSIS, ROUTINE W REFLEX MICROSCOPIC - Abnormal; Notable for the following:    Ketones, ur 5 (*)    Protein, ur 30 (*)    All other components within normal limits  LIPASE, BLOOD    EKG  EKG Interpretation None       Radiology Ct Abdomen Pelvis W Contrast  Result Date: 03/30/2017 CLINICAL DATA:  Abdominal pain with bloating.  Nausea and vomiting. EXAM: CT ABDOMEN AND PELVIS WITH CONTRAST TECHNIQUE: Multidetector CT imaging of the abdomen and pelvis was performed using the standard protocol following bolus administration of intravenous contrast. CONTRAST:  5m ISOVUE-300 IOPAMIDOL (ISOVUE-300) INJECTION 61%, 1020mISOVUE-300 IOPAMIDOL (ISOVUE-300) INJECTION 61% COMPARISON:  08/15/2007 FINDINGS: Lower chest: Tubular branching structures posterior lung bases suggest impacted airways but difficult to assess given the substantial breathing motion artifact. Hepatobiliary: 7 mm hypervascular lesion subcapsular left liver cannot be further characterized. Mild intrahepatic biliary duct prominence. Gallbladder unremarkable. Extrahepatic common bile duct measures 8 mm diameter, increased in the interval. Pancreas: No focal mass  lesion. No dilatation of the main duct. No intraparenchymal cyst. No peripancreatic edema. Spleen: No splenomegaly. No focal mass lesion. Adrenals/Urinary Tract: No adrenal nodule or mass. Cortical scarring noted both kidneys with small renal cysts noted bilaterally. No evidence for hydroureter. Bladder is distended. Stomach/Bowel: Stomach is mildly distended. Duodenum unremarkable. Small bowel loops in the lower abdomen and pelvis are fluid-filled and dilated. A prominently dilated loop in the mid pelvis measures up to 4.6 cm diameter. This dilated loop pin tracts down to the pelvis where a discrete transition point is identified in the right pelvis (see image 53 series 2 and also visible on sagittal image 52 of series 6). I cannot identify dilated small bowel beyond this transition zone to suggest a closed loop etiology. The terminal ileum is collapsed. The appendix is not visualized, but there is no edema or inflammation in the region of the cecum. Diverticuli are seen scattered along the entire length of the colon without CT findings of diverticulitis. Vascular/Lymphatic: There is abdominal aortic atherosclerosis without aneurysm. There is no gastrohepatic or hepatoduodenal ligament lymphadenopathy. No intraperitoneal or retroperitoneal lymphadenopathy. No pelvic sidewall lymphadenopathy. Reproductive: Uterus is surgically absent. There is no adnexal mass. Other: Free fluid is identified around the liver and spleen. Interloop mesenteric fluid is identified in the central small bowel mesentery. Musculoskeletal: Bone windows reveal no worrisome lytic or sclerotic osseous lesions. IMPRESSION: 1. Mechanical small bowel obstruction with small bowel loops dilated up to 4.6 cm diameter and a definite transition zone identified in the right pelvis. Although there is some question of twisting in the region of the transition zone, there is no second transition zone identified nor dilated small bowel after the transition  zone to suggest a closed loop obstruction. As such, imaging features likely related to an adhesion given the history of hysterectomy. There is fluid around the liver and spleen with interloop mesenteric fluid associated with the abnormal small bowel loops. No small bowel wall thickening or pneumatosis at this time to suggest overt bowel ischemia. 2. Probable airway impaction in both posterior lower lobes although not well assessed given motion artifact. Electronically Signed   By: ErMisty Stanley.D.   On: 03/30/2017 21:51    Procedures Procedures (including critical care time)  Medications Ordered in ED Medications  ondansetron (ZOFRAN) injection 4 mg (4 mg Intravenous Given 03/30/17 1848)  ondansetron (ZOFRAN) injection 4 mg (4 mg Intravenous  Given 03/30/17 1924)  iopamidol (ISOVUE-300) 61 % injection (30 mLs  Contrast Given 03/30/17 2119)  iopamidol (ISOVUE-300) 61 % injection 100 mL (100 mLs Intravenous Contrast Given 03/30/17 2119)     Initial Impression / Assessment and Plan / ED Course  I have reviewed the triage vital signs and the nursing notes.  Pertinent labs & imaging results that were available during my care of the patient were reviewed by me and considered in my medical decision making (see chart for details).     Patient with small bowel obstruction. Patient will be admitted by medicine and surgery will consult in the morning  Final Clinical Impressions(s) / ED Diagnoses   Final diagnoses:  SBO (small bowel obstruction) ([place])    New Prescriptions New Prescriptions   No medications on file     Milton Ferguson, MD 03/30/17 2255

## 2017-03-30 NOTE — H&P (Signed)
TR[facility] H&P    Patient Demographics:    Andrea [name], is a 83 y.o. female  MRN: [id number]  DOB - 10-09-1928  Admit Date - 03/30/2017  Referring MD/NP/PA: Dr Roderic Palau  Outpatient Primary MD for the patient is Unk Pinto, MD  Patient coming from: Home  Chief Complaint  Patient presents with  . Abdominal Pain      HPI:    Andrea [name]  is a 83 y.o. female, With history of bronchiectasis, hypothyroidism, hypertension, hyperlipidemia who came to hospital with complaints of abdominal pain which started yesterday. Patient also has been having retching and nausea, but vomited after she came to ED. Patient says that the pain is intermittent, colicky in nature. No aggravating or triggering factors.  Patient has remote history of hysterectomy in the past. She denies fever or chills. No dysuria. No chest pain. No shortness of breath.  In the ED, CT scan of the abdomen and pelvis was done which showed mechanical small bowel obstruction with small bowel loops dilatedup to 4.6 cm diameter and a definite transition zone identified in the right pelvis. General surgery was consulted by ED physician, who recommended NG tube insertion.    Review of systems:     All other systems reviewed and are negative.   With Past History of the following :    Past Medical History:  Diagnosis Date  . Aortic regurgitation   . Bronchiectasis ([place])   . COPD (chronic obstructive pulmonary disease) ([place])   . DJD (degenerative joint disease)   . HLD (hyperlipidemia)   . HTN (hypertension)   . Mild mitral regurgitation by prior echocardiogram   . Mild tricuspid regurgitation   . Palpitations    a. has known history of PAC's and PVC's. b. 09/2015: monitor showed sinus rhythm with occasional PAC's and a brief episode of PAT.  [name] Thyroid disease       Past Surgical History:  Procedure Laterality Date  . ABDOMINAL HYSTERECTOMY   1993  . CATARACT EXTRACTION, BILATERAL        Social History:      Social History  Substance Use Topics  . Smoking status: Former Smoker    Packs/day: 1.00    Years: 20.00    Types: Cigarettes    Quit date: 09/27/1974  . Smokeless tobacco: Never Used  . Alcohol use No       Family History :     Family History  Problem Relation Age of Onset  . Emphysema Mother   . Diabetes Mother   . Cancer Father   . Emphysema Sister   . Emphysema Sister   . Rheumatic fever Sister   . Heart attack Sister   . Diabetes Sister   . Diabetes Brother   . Asthma Brother        as a child      Home Medications:   Prior to Admission medications   Medication Sig Start Date End Date Taking? Authorizing Provider  ALPRAZolam (XANAX) 0.5 MG tablet TAKE 1/2 TO ONE TABLET THREE TIMES DAILYFOR NERVES  01/20/17  Yes Unk Pinto, MD  amLODipine (NORVASC) 10 MG tablet Take 5 mg by mouth daily.    Yes [provider]  Ascorbic Acid (VITAMIN C) 500 MG tablet Take 500 mg by mouth daily.     Yes [provider]  bisoprolol-hydrochlorothiazide (ZIAC) 5-6.25 MG tablet Take 1 tablet by mouth daily. 01/11/17  Yes Strader, [place] M, PA-C  cholecalciferol (VITAMIN D) 1000 UNITS tablet Take 1,000 Units by mouth daily.    Yes [provider]  furosemide (LASIX) 20 MG tablet TAKE ONE TABLET (20MG TOTAL) BY MOUTH DAILY, WITH BREAKFAST, AS NEEDED FOR FLUID (LEG SWELLING). 09/25/16  Yes Unk Pinto, MD  levothyroxine (SYNTHROID, LEVOTHROID) 50 MCG tablet TAKE 1 TABLET BY MOUTH DAILY OR AS DIRECTED 09/03/16  Yes Unk Pinto, MD  losartan (COZAAR) 100 MG tablet Take 0.5 tablets (50 mg total) by mouth daily. 04/25/16  Yes Kathie Dike, MD  Magnesium 400 MG TABS Take 200 mg by mouth daily.   Yes [provider]  omeprazole (PRILOSEC) 40 MG capsule TAKE ONE (1) CAPSULE EACH DAY 01/04/17  Yes Unk Pinto, MD  ipratropium-albuterol (DUONEB) 0.5-2.5 (3) MG/3ML SOLN Take  3 mLs by nebulization every 6 (six) hours as needed. Patient not taking: Reported on 03/30/2017 04/25/16   Kathie Dike, MD     Allergies:     Allergies  Allergen Reactions  . Vancomycin     Worsening kidney function  . Sulfa Antibiotics Palpitations    Unknown     Physical Exam:   Vitals  Blood pressure (!) 141/74, pulse 92, temperature 98.9 F (37.2 C), temperature source Temporal, resp. rate 18, height 5' 3" (1.6 m), weight 46.3 kg (102 lb), SpO2 92 %.  1.  General: Appears in no acute distress  2. Psychiatric:  Intact judgement and  insight, awake alert, oriented x 3.  3. Neurologic: No focal neurological deficits, all cranial nerves intact.Strength 5/5 all 4 extremities, sensation intact all 4 extremities, plantars down going.  4. Eyes :  anicteric sclerae, moist conjunctivae with no lid lag. PERRLA.  5. ENMT:  Oropharynx clear with moist mucous membranes and good dentition  6. Neck:  supple, no cervical lymphadenopathy appriciated, No thyromegaly  7. Respiratory : Normal respiratory effort, good air movement bilaterally,clear to  auscultation bilaterally  8. Cardiovascular : RRR, no gallops, rubs or murmurs, no leg edema  9. Gastrointestinal:  Positive bowel sounds, abdomen soft, non-tender to palpation,no hepatosplenomegaly, no rigidity or guarding       10. Skin:  No cyanosis, normal texture and turgor, no rash, lesions or ulcers  11.Musculoskeletal:  Good muscle tone,  joints appear normal , no effusions,  normal range of motion    Data Review:    CBC  Recent Labs Lab 03/30/17 1850  WBC 13.6*  HGB 13.4  HCT 40.8  PLT 269  MCV 92.1  MCH 30.2  MCHC 32.8  RDW 12.5   ------------------------------------------------------------------------------------------------------------------  Chemistries   Recent Labs Lab 03/30/17 1850  NA 134*  K 4.0  CL 91*  CO2 30  GLUCOSE 122*  BUN 31*  CREATININE 1.05*  CALCIUM 9.9  AST 30  ALT 12*   ALKPHOS 75  BILITOT 1.0   ------------------------------------------------------------------------------------------------------------------  ------------------------------------------------------------------------------------------------------------------ GFR: Estimated Creatinine Clearance: 28.1 mL/min (A) (by C-G formula based on SCr of 1.05 mg/dL (H)). Liver Function Tests:  Recent Labs Lab 03/30/17 1850  AST 30  ALT 12*  ALKPHOS 75  BILITOT 1.0  PROT 8.3*  ALBUMIN 4.2  Recent Labs Lab 03/30/17 1850  LIPASE 47   No results for input(s): AMMONIA in the last 168 hours. Coagulation Profile: No results for input(s): INR, PROTIME in the last 168 hours. Cardiac Enzymes: No results for input(s): CKTOTAL, CKMB, CKMBINDEX, TROPONINI in the last 168 hours. BNP (last 3 results) No results for input(s): PROBNP in the last 8760 hours. HbA1C: No results for input(s): HGBA1C in the last 72 hours. CBG: No results for input(s): GLUCAP in the last 168 hours. Lipid Profile: No results for input(s): CHOL, HDL, LDLCALC, TRIG, CHOLHDL, LDLDIRECT in the last 72 hours. Thyroid Function Tests: No results for input(s): TSH, T4TOTAL, FREET4, T3FREE, THYROIDAB in the last 72 hours. Anemia Panel: No results for input(s): VITAMINB12, FOLATE, FERRITIN, TIBC, IRON, RETICCTPCT in the last 72 hours.  --------------------------------------------------------------------------------------------------------------- Urine analysis:    Component Value Date/Time   COLORURINE YELLOW 03/30/2017 2130   APPEARANCEUR CLEAR 03/30/2017 2130   LABSPEC 1.015 03/30/2017 2130   PHURINE 7.0 03/30/2017 2130   GLUCOSEU NEGATIVE 03/30/2017 2130   HGBUR NEGATIVE 03/30/2017 2130   BILIRUBINUR NEGATIVE 03/30/2017 2130   KETONESUR 5 (A) 03/30/2017 2130   PROTEINUR 30 (A) 03/30/2017 2130   UROBILINOGEN 0.2 08/20/2014 1613   NITRITE NEGATIVE 03/30/2017 2130   LEUKOCYTESUR NEGATIVE 03/30/2017 2130       Imaging Results:    Ct Abdomen Pelvis W Contrast  Result Date: 03/30/2017 CLINICAL DATA:  Abdominal pain with bloating.  Nausea and vomiting. EXAM: CT ABDOMEN AND PELVIS WITH CONTRAST TECHNIQUE: Multidetector CT imaging of the abdomen and pelvis was performed using the standard protocol following bolus administration of intravenous contrast. CONTRAST:  30m ISOVUE-300 IOPAMIDOL (ISOVUE-300) INJECTION 61%, 1081mISOVUE-300 IOPAMIDOL (ISOVUE-300) INJECTION 61% COMPARISON:  08/15/2007 FINDINGS: Lower chest: Tubular branching structures posterior lung bases suggest impacted airways but difficult to assess given the substantial breathing motion artifact. Hepatobiliary: 7 mm hypervascular lesion subcapsular left liver cannot be further characterized. Mild intrahepatic biliary duct prominence. Gallbladder unremarkable. Extrahepatic common bile duct measures 8 mm diameter, increased in the interval. Pancreas: No focal mass lesion. No dilatation of the main duct. No intraparenchymal cyst. No peripancreatic edema. Spleen: No splenomegaly. No focal mass lesion. Adrenals/Urinary Tract: No adrenal nodule or mass. Cortical scarring noted both kidneys with small renal cysts noted bilaterally. No evidence for hydroureter. Bladder is distended. Stomach/Bowel: Stomach is mildly distended. Duodenum unremarkable. Small bowel loops in the lower abdomen and pelvis are fluid-filled and dilated. A prominently dilated loop in the mid pelvis measures up to 4.6 cm diameter. This dilated loop pin tracts down to the pelvis where a discrete transition point is identified in the right pelvis (see image 53 series 2 and also visible on sagittal image 52 of series 6). I cannot identify dilated small bowel beyond this transition zone to suggest a closed loop etiology. The terminal ileum is collapsed. The appendix is not visualized, but there is no edema or inflammation in the region of the cecum. Diverticuli are seen scattered along the entire  length of the colon without CT findings of diverticulitis. Vascular/Lymphatic: There is abdominal aortic atherosclerosis without aneurysm. There is no gastrohepatic or hepatoduodenal ligament lymphadenopathy. No intraperitoneal or retroperitoneal lymphadenopathy. No pelvic sidewall lymphadenopathy. Reproductive: Uterus is surgically absent. There is no adnexal mass. Other: Free fluid is identified around the liver and spleen. Interloop mesenteric fluid is identified in the central small bowel mesentery. Musculoskeletal: Bone windows reveal no worrisome lytic or sclerotic osseous lesions. IMPRESSION: 1. Mechanical small bowel obstruction with small bowel loops dilated  up to 4.6 cm diameter and a definite transition zone identified in the right pelvis. Although there is some question of twisting in the region of the transition zone, there is no second transition zone identified nor dilated small bowel after the transition zone to suggest a closed loop obstruction. As such, imaging features likely related to an adhesion given the history of hysterectomy. There is fluid around the liver and spleen with interloop mesenteric fluid associated with the abnormal small bowel loops. No small bowel wall thickening or pneumatosis at this time to suggest overt bowel ischemia. 2. Probable airway impaction in both posterior lower lobes although not well assessed given motion artifact. Electronically Signed   By: Misty Stanley M.D.   On: 03/30/2017 21:51       Assessment & Plan:    Active Problems:   Essential hypertension   Hypothyroidism   Chronic diastolic CHF (congestive heart failure) ([facility])   Small bowel obstruction ([place])   1. Small bowel obstruction-place under observation, will insert NG tube to intermittent suction, general surgery consulted by ED physician. Dr. Arnoldo Morale to see patient in a.m. Will obtain abdominal x-ray in a.m. IV normal saline at 100 mL per hour. 2. Hypothyroidism-start Synthroid 25 g IV  daily. Hold by mouth Synthroid. 3. Hypertension- start hydralazine 10 mg IV every 4 hours when necessary. 4. Chronic diastolic CHF-stable, euvolemic. Will hold Lasix at this time   DVT Prophylaxis-   Lovenox   AM Labs Ordered, also please review Full Orders  Family Communication: Admission, patients condition and plan of care including tests being ordered have been discussed with the patient And her daughters at bedside who indicate understanding and agree with the plan and Code Status.  Code Status: Full code  Admission status: Observation  Time spent in minutes : 60 minutes   Andrea [name] S M.D on 03/30/2017 at 11:09 PM  Between 7am to 7pm - Pager - [phone]. After 7pm go to [email or web address] - password [facility]  [facility] - Office  [phone]

## 2017-03-30 NOTE — ED Triage Notes (Signed)
Abdominal pin onset last night with abdominal bloating

## 2017-03-31 ENCOUNTER — Inpatient Hospital Stay (HOSPITAL_COMMUNITY): Payer: Medicare Other

## 2017-03-31 ENCOUNTER — Encounter (HOSPITAL_COMMUNITY): Payer: Self-pay

## 2017-03-31 DIAGNOSIS — I509 Heart failure, unspecified: Secondary | ICD-10-CM | POA: Diagnosis not present

## 2017-03-31 DIAGNOSIS — K5669 Other partial intestinal obstruction: Secondary | ICD-10-CM | POA: Diagnosis not present

## 2017-03-31 DIAGNOSIS — Z9981 Dependence on supplemental oxygen: Secondary | ICD-10-CM | POA: Diagnosis not present

## 2017-03-31 DIAGNOSIS — E038 Other specified hypothyroidism: Secondary | ICD-10-CM

## 2017-03-31 DIAGNOSIS — K56609 Unspecified intestinal obstruction, unspecified as to partial versus complete obstruction: Secondary | ICD-10-CM | POA: Diagnosis not present

## 2017-03-31 DIAGNOSIS — J438 Other emphysema: Secondary | ICD-10-CM | POA: Diagnosis not present

## 2017-03-31 DIAGNOSIS — I480 Paroxysmal atrial fibrillation: Secondary | ICD-10-CM | POA: Diagnosis not present

## 2017-03-31 DIAGNOSIS — K598 Other specified functional intestinal disorders: Secondary | ICD-10-CM | POA: Diagnosis not present

## 2017-03-31 DIAGNOSIS — R1312 Dysphagia, oropharyngeal phase: Secondary | ICD-10-CM | POA: Diagnosis not present

## 2017-03-31 DIAGNOSIS — E559 Vitamin D deficiency, unspecified: Secondary | ICD-10-CM | POA: Diagnosis not present

## 2017-03-31 DIAGNOSIS — Z681 Body mass index (BMI) 19 or less, adult: Secondary | ICD-10-CM | POA: Diagnosis not present

## 2017-03-31 DIAGNOSIS — J9611 Chronic respiratory failure with hypoxia: Secondary | ICD-10-CM | POA: Diagnosis present

## 2017-03-31 DIAGNOSIS — I4892 Unspecified atrial flutter: Secondary | ICD-10-CM | POA: Diagnosis present

## 2017-03-31 DIAGNOSIS — E039 Hypothyroidism, unspecified: Secondary | ICD-10-CM | POA: Diagnosis not present

## 2017-03-31 DIAGNOSIS — I5033 Acute on chronic diastolic (congestive) heart failure: Secondary | ICD-10-CM | POA: Diagnosis not present

## 2017-03-31 DIAGNOSIS — E876 Hypokalemia: Secondary | ICD-10-CM | POA: Diagnosis not present

## 2017-03-31 DIAGNOSIS — R112 Nausea with vomiting, unspecified: Secondary | ICD-10-CM | POA: Diagnosis not present

## 2017-03-31 DIAGNOSIS — R1110 Vomiting, unspecified: Secondary | ICD-10-CM | POA: Diagnosis not present

## 2017-03-31 DIAGNOSIS — E871 Hypo-osmolality and hyponatremia: Secondary | ICD-10-CM | POA: Diagnosis present

## 2017-03-31 DIAGNOSIS — M6281 Muscle weakness (generalized): Secondary | ICD-10-CM | POA: Diagnosis not present

## 2017-03-31 DIAGNOSIS — Z79899 Other long term (current) drug therapy: Secondary | ICD-10-CM | POA: Diagnosis not present

## 2017-03-31 DIAGNOSIS — I110 Hypertensive heart disease with heart failure: Secondary | ICD-10-CM | POA: Diagnosis present

## 2017-03-31 DIAGNOSIS — F419 Anxiety disorder, unspecified: Secondary | ICD-10-CM | POA: Diagnosis present

## 2017-03-31 DIAGNOSIS — Z87891 Personal history of nicotine dependence: Secondary | ICD-10-CM | POA: Diagnosis not present

## 2017-03-31 DIAGNOSIS — R109 Unspecified abdominal pain: Secondary | ICD-10-CM | POA: Diagnosis not present

## 2017-03-31 DIAGNOSIS — R05 Cough: Secondary | ICD-10-CM | POA: Diagnosis not present

## 2017-03-31 DIAGNOSIS — I5032 Chronic diastolic (congestive) heart failure: Secondary | ICD-10-CM | POA: Diagnosis not present

## 2017-03-31 DIAGNOSIS — I10 Essential (primary) hypertension: Secondary | ICD-10-CM | POA: Diagnosis not present

## 2017-03-31 DIAGNOSIS — Z882 Allergy status to sulfonamides status: Secondary | ICD-10-CM | POA: Diagnosis not present

## 2017-03-31 DIAGNOSIS — I498 Other specified cardiac arrhythmias: Secondary | ICD-10-CM | POA: Diagnosis not present

## 2017-03-31 DIAGNOSIS — Z881 Allergy status to other antibiotic agents status: Secondary | ICD-10-CM | POA: Diagnosis not present

## 2017-03-31 DIAGNOSIS — J690 Pneumonitis due to inhalation of food and vomit: Secondary | ICD-10-CM | POA: Diagnosis not present

## 2017-03-31 DIAGNOSIS — I471 Supraventricular tachycardia: Secondary | ICD-10-CM | POA: Diagnosis not present

## 2017-03-31 DIAGNOSIS — I083 Combined rheumatic disorders of mitral, aortic and tricuspid valves: Secondary | ICD-10-CM | POA: Diagnosis present

## 2017-03-31 DIAGNOSIS — R262 Difficulty in walking, not elsewhere classified: Secondary | ICD-10-CM | POA: Diagnosis not present

## 2017-03-31 DIAGNOSIS — I481 Persistent atrial fibrillation: Secondary | ICD-10-CM | POA: Diagnosis present

## 2017-03-31 DIAGNOSIS — Z4682 Encounter for fitting and adjustment of non-vascular catheter: Secondary | ICD-10-CM | POA: Diagnosis not present

## 2017-03-31 DIAGNOSIS — K565 Intestinal adhesions [bands], unspecified as to partial versus complete obstruction: Secondary | ICD-10-CM | POA: Diagnosis present

## 2017-03-31 DIAGNOSIS — R140 Abdominal distension (gaseous): Secondary | ICD-10-CM | POA: Diagnosis not present

## 2017-03-31 DIAGNOSIS — E43 Unspecified severe protein-calorie malnutrition: Secondary | ICD-10-CM | POA: Diagnosis not present

## 2017-03-31 DIAGNOSIS — J449 Chronic obstructive pulmonary disease, unspecified: Secondary | ICD-10-CM | POA: Diagnosis not present

## 2017-03-31 DIAGNOSIS — E785 Hyperlipidemia, unspecified: Secondary | ICD-10-CM | POA: Diagnosis present

## 2017-03-31 LAB — COMPREHENSIVE METABOLIC PANEL
ALK PHOS: 59 U/L (ref 38–126)
ALT: 9 U/L — AB (ref 14–54)
ANION GAP: 8 (ref 5–15)
AST: 19 U/L (ref 15–41)
Albumin: 3.3 g/dL — ABNORMAL LOW (ref 3.5–5.0)
BILIRUBIN TOTAL: 1 mg/dL (ref 0.3–1.2)
BUN: 28 mg/dL — ABNORMAL HIGH (ref 6–20)
CALCIUM: 8.7 mg/dL — AB (ref 8.9–10.3)
CO2: 32 mmol/L (ref 22–32)
CREATININE: 1.04 mg/dL — AB (ref 0.44–1.00)
Chloride: 94 mmol/L — ABNORMAL LOW (ref 101–111)
GFR calc Af Amer: 55 mL/min — ABNORMAL LOW (ref >60)
GFR calc non Af Amer: 47 mL/min — ABNORMAL LOW (ref >60)
Glucose, Bld: 102 mg/dL — ABNORMAL HIGH (ref 65–99)
Potassium: 3.8 mmol/L (ref 3.5–5.1)
Sodium: 134 mmol/L — ABNORMAL LOW (ref 135–145)
TOTAL PROTEIN: 6.4 g/dL — AB (ref 6.5–8.1)

## 2017-03-31 LAB — CBC
HCT: 37.2 % (ref 36.0–46.0)
HEMOGLOBIN: 11.8 g/dL — AB (ref 12.0–15.0)
MCH: 29.5 pg (ref 26.0–34.0)
MCHC: 31.7 g/dL (ref 30.0–36.0)
MCV: 93 fL (ref 78.0–100.0)
Platelets: 231 10*3/uL (ref 150–400)
RBC: 4 MIL/uL (ref 3.87–5.11)
RDW: 12.6 % (ref 11.5–15.5)
WBC: 11.9 10*3/uL — ABNORMAL HIGH (ref 4.0–10.5)

## 2017-03-31 MED ORDER — PHENOL 1.4 % MT LIQD
1.0000 | OROMUCOSAL | Status: DC | PRN
  Administered 2017-03-31 – 2017-04-01 (×2): 1 via OROMUCOSAL
  Filled 2017-03-31: qty 177

## 2017-03-31 MED ORDER — ONDANSETRON HCL 4 MG/2ML IJ SOLN
4.0000 mg | Freq: Four times a day (QID) | INTRAMUSCULAR | Status: DC | PRN
  Administered 2017-04-05 – 2017-04-06 (×3): 4 mg via INTRAVENOUS
  Filled 2017-03-31 (×3): qty 2

## 2017-03-31 MED ORDER — HYDRALAZINE HCL 20 MG/ML IJ SOLN: 10.0000 mg | INTRAMUSCULAR | Status: DC | PRN

## 2017-03-31 MED ORDER — LEVOTHYROXINE SODIUM 100 MCG IV SOLR
25.0000 ug | Freq: Every day | INTRAVENOUS | Status: DC
  Administered 2017-03-31 – 2017-04-05 (×6): 25 ug via INTRAVENOUS
  Filled 2017-03-31 (×9): qty 5

## 2017-03-31 MED ORDER — ENOXAPARIN SODIUM 30 MG/0.3ML ~~LOC~~ SOLN
30.0000 mg | SUBCUTANEOUS | Status: DC
  Administered 2017-03-31: 30 mg via SUBCUTANEOUS
  Filled 2017-03-31: qty 0.3

## 2017-03-31 MED ORDER — ONDANSETRON HCL 4 MG PO TABS: 4.0000 mg | ORAL_TABLET | Freq: Four times a day (QID) | ORAL | Status: DC | PRN

## 2017-03-31 MED ORDER — IPRATROPIUM-ALBUTEROL 0.5-2.5 (3) MG/3ML IN SOLN: 3.0000 mL | Freq: Four times a day (QID) | RESPIRATORY_TRACT | Status: DC | PRN

## 2017-03-31 MED ORDER — LORAZEPAM 2 MG/ML IJ SOLN
0.2500 mg | Freq: Three times a day (TID) | INTRAMUSCULAR | Status: DC | PRN
  Administered 2017-04-01 – 2017-04-03 (×4): 0.25 mg via INTRAVENOUS
  Filled 2017-03-31 (×4): qty 1

## 2017-03-31 MED ORDER — SODIUM CHLORIDE 0.9 % IV SOLN
INTRAVENOUS | Status: DC
  Administered 2017-03-31 – 2017-04-01 (×5): via INTRAVENOUS

## 2017-03-31 NOTE — Consult Note (Signed)
Reason for Consult: Small bowel obstruction Referring Physician: Dr. Zachery Dauer is an 83 y.o. female.  HPI: Patient is an 83 year old white female who presented to the hospital with worsening abdominal distention and pain. She did have nausea and vomiting. She does have a history of COPD, respiratory failure, diastolic congestive heart failure, hypothyroidism. This is her first episode. She has not been admitted for a bowel obstruction the past. CT scan of the abdomen revealed a small bowel obstruction possibly secondary to adhesive disease in the pelvis. The patient was admitted to the hospital for further evaluation and treatment. An NG tube was placed. She is currently resting comfortably. She denies any abdominal pain. She does not remember exactly when her last bowel movement was.  Past Medical History:  Diagnosis Date  . Aortic regurgitation   . Bronchiectasis ([place])   . COPD (chronic obstructive pulmonary disease) ([place])   . DJD (degenerative joint disease)   . HLD (hyperlipidemia)   . HTN (hypertension)   . Mild mitral regurgitation by prior echocardiogram   . Mild tricuspid regurgitation   . Palpitations    a. has known history of PAC's and PVC's. b. 09/2015: monitor showed sinus rhythm with occasional PAC's and a brief episode of PAT.  [name] Thyroid disease     Past Surgical History:  Procedure Laterality Date  . ABDOMINAL HYSTERECTOMY  1993  . CATARACT EXTRACTION, BILATERAL      Family History  Problem Relation Age of Onset  . Emphysema Mother   . Diabetes Mother   . Cancer Father   . Emphysema Sister   . Emphysema Sister   . Rheumatic fever Sister   . Heart attack Sister   . Diabetes Sister   . Diabetes Brother   . Asthma Brother        as a child    Social History:  reports that she quit smoking about 42 years ago. Her smoking use included Cigarettes. She has a 20.00 pack-year smoking history. She has never used smokeless tobacco. She reports that she does  not drink alcohol or use drugs.  Allergies:  Allergies  Allergen Reactions  . Vancomycin     Worsening kidney function  . Sulfa Antibiotics Palpitations    Unknown    Medications:  Scheduled: . enoxaparin (LOVENOX) injection  30 mg Subcutaneous Q24H  . levothyroxine  25 mcg Intravenous QAC breakfast    Results for orders placed or performed during the hospital encounter of 03/30/17 (from the past 48 hour(s))  Lipase, blood     Status: None   Collection Time: 03/30/17  6:50 PM  Result Value Ref Range   Lipase 47 11 - 51 U/L  Comprehensive metabolic panel     Status: Abnormal   Collection Time: 03/30/17  6:50 PM  Result Value Ref Range   Sodium 134 (L) 135 - 145 mmol/L   Potassium 4.0 3.5 - 5.1 mmol/L   Chloride 91 (L) 101 - 111 mmol/L   CO2 30 22 - 32 mmol/L   Glucose, Bld 122 (H) 65 - 99 mg/dL   BUN 31 (H) 6 - 20 mg/dL   Creatinine, Ser 1.05 (H) 0.44 - 1.00 mg/dL   Calcium 9.9 8.9 - 10.3 mg/dL   Total Protein 8.3 (H) 6.5 - 8.1 g/dL   Albumin 4.2 3.5 - 5.0 g/dL   AST 30 15 - 41 U/L   ALT 12 (L) 14 - 54 U/L   Alkaline Phosphatase 75 38 - 126 U/L  Total Bilirubin 1.0 0.3 - 1.2 mg/dL   GFR calc non Af Amer 47 (L) >60 mL/min   GFR calc Af Amer 54 (L) >60 mL/min    Comment: (NOTE) The eGFR has been calculated using the CKD EPI equation. This calculation has not been validated in all clinical situations. eGFR's persistently <60 mL/min signify possible Chronic Kidney Disease.    Anion gap 13 5 - 15  CBC     Status: Abnormal   Collection Time: 03/30/17  6:50 PM  Result Value Ref Range   WBC 13.6 (H) 4.0 - 10.5 K/uL   RBC 4.43 3.87 - 5.11 MIL/uL   Hemoglobin 13.4 12.0 - 15.0 g/dL   HCT 40.8 36.0 - 46.0 %   MCV 92.1 78.0 - 100.0 fL   MCH 30.2 26.0 - 34.0 pg   MCHC 32.8 30.0 - 36.0 g/dL   RDW 12.5 11.5 - 15.5 %   Platelets 269 150 - 400 K/uL  Urinalysis, Routine w reflex microscopic     Status: Abnormal   Collection Time: 03/30/17  9:30 PM  Result Value Ref Range    Color, Urine YELLOW YELLOW   APPearance CLEAR CLEAR   Specific Gravity, Urine 1.015 1.005 - 1.030   pH 7.0 5.0 - 8.0   Glucose, UA NEGATIVE NEGATIVE mg/dL   Hgb urine dipstick NEGATIVE NEGATIVE   Bilirubin Urine NEGATIVE NEGATIVE   Ketones, ur 5 (A) NEGATIVE mg/dL   Protein, ur 30 (A) NEGATIVE mg/dL   Nitrite NEGATIVE NEGATIVE   Leukocytes, UA NEGATIVE NEGATIVE   RBC / HPF 0-5 0 - 5 RBC/hpf   WBC, UA 0-5 0 - 5 WBC/hpf   Bacteria, UA NONE SEEN NONE SEEN   Squamous Epithelial / LPF NONE SEEN NONE SEEN  CBC     Status: Abnormal   Collection Time: 03/31/17  6:30 AM  Result Value Ref Range   WBC 11.9 (H) 4.0 - 10.5 K/uL   RBC 4.00 3.87 - 5.11 MIL/uL   Hemoglobin 11.8 (L) 12.0 - 15.0 g/dL   HCT 37.2 36.0 - 46.0 %   MCV 93.0 78.0 - 100.0 fL   MCH 29.5 26.0 - 34.0 pg   MCHC 31.7 30.0 - 36.0 g/dL   RDW 12.6 11.5 - 15.5 %   Platelets 231 150 - 400 K/uL  Comprehensive metabolic panel     Status: Abnormal   Collection Time: 03/31/17  6:30 AM  Result Value Ref Range   Sodium 134 (L) 135 - 145 mmol/L   Potassium 3.8 3.5 - 5.1 mmol/L   Chloride 94 (L) 101 - 111 mmol/L   CO2 32 22 - 32 mmol/L   Glucose, Bld 102 (H) 65 - 99 mg/dL   BUN 28 (H) 6 - 20 mg/dL   Creatinine, Ser 1.04 (H) 0.44 - 1.00 mg/dL   Calcium 8.7 (L) 8.9 - 10.3 mg/dL   Total Protein 6.4 (L) 6.5 - 8.1 g/dL   Albumin 3.3 (L) 3.5 - 5.0 g/dL   AST 19 15 - 41 U/L   ALT 9 (L) 14 - 54 U/L   Alkaline Phosphatase 59 38 - 126 U/L   Total Bilirubin 1.0 0.3 - 1.2 mg/dL   GFR calc non Af Amer 47 (L) >60 mL/min   GFR calc Af Amer 55 (L) >60 mL/min    Comment: (NOTE) The eGFR has been calculated using the CKD EPI equation. This calculation has not been validated in all clinical situations. eGFR's persistently <60 mL/min signify possible Chronic Kidney  Disease.    Anion gap 8 5 - 15    Ct Abdomen Pelvis W Contrast  Result Date: 03/30/2017 CLINICAL DATA:  Abdominal pain with bloating.  Nausea and vomiting. EXAM: CT ABDOMEN AND  PELVIS WITH CONTRAST TECHNIQUE: Multidetector CT imaging of the abdomen and pelvis was performed using the standard protocol following bolus administration of intravenous contrast. CONTRAST:  12m ISOVUE-300 IOPAMIDOL (ISOVUE-300) INJECTION 61%, 1056mISOVUE-300 IOPAMIDOL (ISOVUE-300) INJECTION 61% COMPARISON:  08/15/2007 FINDINGS: Lower chest: Tubular branching structures posterior lung bases suggest impacted airways but difficult to assess given the substantial breathing motion artifact. Hepatobiliary: 7 mm hypervascular lesion subcapsular left liver cannot be further characterized. Mild intrahepatic biliary duct prominence. Gallbladder unremarkable. Extrahepatic common bile duct measures 8 mm diameter, increased in the interval. Pancreas: No focal mass lesion. No dilatation of the main duct. No intraparenchymal cyst. No peripancreatic edema. Spleen: No splenomegaly. No focal mass lesion. Adrenals/Urinary Tract: No adrenal nodule or mass. Cortical scarring noted both kidneys with small renal cysts noted bilaterally. No evidence for hydroureter. Bladder is distended. Stomach/Bowel: Stomach is mildly distended. Duodenum unremarkable. Small bowel loops in the lower abdomen and pelvis are fluid-filled and dilated. A prominently dilated loop in the mid pelvis measures up to 4.6 cm diameter. This dilated loop pin tracts down to the pelvis where a discrete transition point is identified in the right pelvis (see image 53 series 2 and also visible on sagittal image 52 of series 6). I cannot identify dilated small bowel beyond this transition zone to suggest a closed loop etiology. The terminal ileum is collapsed. The appendix is not visualized, but there is no edema or inflammation in the region of the cecum. Diverticuli are seen scattered along the entire length of the colon without CT findings of diverticulitis. Vascular/Lymphatic: There is abdominal aortic atherosclerosis without aneurysm. There is no gastrohepatic or  hepatoduodenal ligament lymphadenopathy. No intraperitoneal or retroperitoneal lymphadenopathy. No pelvic sidewall lymphadenopathy. Reproductive: Uterus is surgically absent. There is no adnexal mass. Other: Free fluid is identified around the liver and spleen. Interloop mesenteric fluid is identified in the central small bowel mesentery. Musculoskeletal: Bone windows reveal no worrisome lytic or sclerotic osseous lesions. IMPRESSION: 1. Mechanical small bowel obstruction with small bowel loops dilated up to 4.6 cm diameter and a definite transition zone identified in the right pelvis. Although there is some question of twisting in the region of the transition zone, there is no second transition zone identified nor dilated small bowel after the transition zone to suggest a closed loop obstruction. As such, imaging features likely related to an adhesion given the history of hysterectomy. There is fluid around the liver and spleen with interloop mesenteric fluid associated with the abnormal small bowel loops. No small bowel wall thickening or pneumatosis at this time to suggest overt bowel ischemia. 2. Probable airway impaction in both posterior lower lobes although not well assessed given motion artifact. Electronically Signed   By: ErMisty Stanley.D.   On: 03/30/2017 21:51   Dg Chest Portable 1 View  Result Date: 03/30/2017 CLINICAL DATA:  Nasogastric tube placement. Generalized abdominal pain and bloating, acute onset. Initial encounter. EXAM: PORTABLE CHEST 1 VIEW COMPARISON:  Chest radiograph performed 06/10/2016 FINDINGS: The patient's enteric tube is seen extending below the diaphragm. The lungs are hyperexpanded, with biapical scarring. Vascular congestion is noted, with increased interstitial markings. Mild interstitial edema cannot be excluded. No pleural effusion or pneumothorax is seen. The cardiomediastinal silhouette is borderline normal in size. No acute osseous abnormalities  are seen. Mild  degenerative change is noted at the glenohumeral joints bilaterally. IMPRESSION: 1. Enteric tube noted extending below the diaphragm. 2. Lungs hyperexpanded, with biapical scarring, likely reflecting COPD. Underlying vascular congestion, with increased interstitial markings. Mild interstitial edema cannot be excluded. Electronically Signed   By: Garald Balding M.D.   On: 03/30/2017 23:55    ROS:  Pertinent items are noted in HPI.  Blood pressure (!) 138/56, pulse 99, temperature 97.6 F (36.4 C), temperature source Axillary, resp. rate 18, height _0  (1.6 m), weight 100 lb 15.5 oz (45.8 kg), SpO2 93 %. Physical Exam: Frail appearing white female in no acute distress. Head is normocephalic, atraumatic Neck is supple Lungs clear auscultation with breath sounds bilaterally Heart examination reveals a regular rate and rhythm without S3, S4, murmurs Abdomen is soft with lower abdominal distention noted. No rigidity noted. This is not tense. No tenderness is noted. No hernias are noted.  CT scan images personally reviewed Assessment/Plan: Impression: Small bowel obstruction, most likely secondary to adhesive disease. No need for acute surgical intervention at this time. Plan: Continue NG tube decompression for now. This was discussed with the patient and family.  Aviva Signs 03/31/2017, 10:21 AM

## 2017-03-31 NOTE — Care Management Note (Signed)
Case Management Note  Patient Details  Name: Andrea [name] MRN: [id number] Date of Birth: 1929/04/02  Subjective/Objective:                  Pt admitted with SBO. Pt from home with husband. Pt sleeping at this time but large family presence at the bedside. Family reports she is ind with ADL's at baseline. Has COPD but is not one oxygen at home pta. CM introduced myself, discussed I will be following and planning for DC, left my card with family in case they have questions or concerns.   Action/Plan: Anticipate pt will DC home, may need HH. CM will cont to follow and visit with pt once more alert.   Expected Discharge Date:     04/03/2017             Expected Discharge Plan:  [facility]  In-House Referral:  NA  Discharge planning Services  CM Consult  Post Acute Care Choice:  NA Choice offered to:  NA  Status of Service:  In process, will continue to follow  Sherald Barge, RN 03/31/2017, 11:20 AM

## 2017-03-31 NOTE — Progress Notes (Signed)
PROGRESS NOTE  Andrea [name] NKN:[id number] DOB: 1929-02-03 DOA: 03/30/2017 PCP: Unk Pinto, MD  Brief History:  83 year old female with a history of bronchiectasis/COPD, chronic respiratory failure on 2 L at night, hypertension, hyperlipidemia, diastolic CHF, hypothyroidism, and PAT presented with a one to two-day history of abdominal pain with associated nausea and vomiting. The patient denied any fevers, chills, chest pain, shortness breath, diarrhea, hematochezia, melena. CT of the abdomen and pelvis at the time of admission showed dilated small bowel loops up to 4.6 cm in the mid pelvis with a transition zone in the right pelvis. NG tube was inserted for decompression and Gen. surgery was consulted to assist with management.  Assessment/Plan: Small bowel obstruction -Likely due to adhesions -Continue NG tube decompression -Consult general surgery -remain NPO -continue IVF--decrease rate -am AXR  COPD/bronchiectasis/chronic respiratory failure with hypoxia -On 2 L nasal cannula and nighttime at home -Presently stable on 2 L  Chronic diastolic CHF -Daily weights -Decrease IV fluids -Dry weight approximately 104 pounds  Essential hypertension -Holding bisoprolol/HCTZ until able to tolerate po reliably -Holding amlodipine and losartan until able to reliably tolerate po -hydralazine IV prn SBP >180  Hypothyroidism -Continue Synthroid IV  Leukocytosis -Likely secondary to stress demargination -Patient is afebrile and hemodynamically stable -Remain off antibiotics    Disposition Plan:   Home in 2-3 days  Family Communication:   Daughter updated at bedside 7/5--Total time spent 35 minutes.  Greater than 50% spent face to face counseling and coordinating care.   Consultants:  General surgery  Code Status:  FULL   DVT Prophylaxis:  [place] Lovenox   Procedures: As Listed in Progress Note Above  Antibiotics: None    Subjective: Patient denies fevers,  chills, headache, chest pain, dyspnea, nausea, vomiting, diarrhea, abdominal pain, dysuria, hematuria, hematochezia, and melena.   Objective: Vitals:   03/30/17 1718 03/30/17 1939 03/31/17 0034 03/31/17 0523  BP: (!) 184/72 (!) 141/74 (!) 148/65 (!) 138/56  Pulse: 81 92 89 99  Resp: _0 Temp: 98.9 F (37.2 C)  98.9 F (37.2 C) 97.6 F (36.4 C)  TempSrc: Temporal  Oral Axillary  SpO2: 95% 92% (!) 89% 93%  Weight: 46.3 kg (102 lb)  45.8 kg (100 lb 15.5 oz)   Height: 5' 3" (1.6 m)  5' 3" (1.6 m)     Intake/Output Summary (Last 24 hours) at 03/31/17 0929 Last data filed at 03/31/17 0600  Gross per 24 hour  Intake                0 ml  Output              300 ml  Net             -300 ml   Weight change:  Exam:   General:  Pt is alert, follows commands appropriately, not in acute distress  HEENT: No icterus, No thrush, No neck mass, [place]/AT  Cardiovascular: RRR, S1/S2, no rubs, no gallops  Respiratory: Bibasilar crackles. No wheezing. Good air movement.  Abdomen: Soft/diminished BS, RLQ tender, non distended, no guarding  Extremities: No edema, No lymphangitis, No petechiae, No rashes, no synovitis   Data Reviewed: I have personally reviewed following labs and imaging studies Basic Metabolic Panel:  Recent Labs Lab 03/30/17 1850 03/31/17 0630  NA 134* 134*  K 4.0 3.8  CL 91* 94*  CO2 30 32  GLUCOSE 122* 102*  BUN 31* 28*  CREATININE 1.05* 1.04*  CALCIUM 9.9 8.7*   Liver Function Tests:  Recent Labs Lab 03/30/17 1850 03/31/17 0630  AST 30 19  ALT 12* 9*  ALKPHOS 75 59  BILITOT 1.0 1.0  PROT 8.3* 6.4*  ALBUMIN 4.2 3.3*    Recent Labs Lab 03/30/17 1850  LIPASE 47   No results for input(s): AMMONIA in the last 168 hours. Coagulation Profile: No results for input(s): INR, PROTIME in the last 168 hours. CBC:  Recent Labs Lab 03/30/17 1850 03/31/17 0630  WBC 13.6* 11.9*  HGB 13.4 11.8*  HCT 40.8 37.2  MCV 92.1 93.0  PLT 269 231    Cardiac Enzymes: No results for input(s): CKTOTAL, CKMB, CKMBINDEX, TROPONINI in the last 168 hours. BNP: Invalid input(s): POCBNP CBG: No results for input(s): GLUCAP in the last 168 hours. HbA1C: No results for input(s): HGBA1C in the last 72 hours. Urine analysis:    Component Value Date/Time   COLORURINE YELLOW 03/30/2017 2130   APPEARANCEUR CLEAR 03/30/2017 2130   LABSPEC 1.015 03/30/2017 2130   PHURINE 7.0 03/30/2017 2130   GLUCOSEU NEGATIVE 03/30/2017 2130   HGBUR NEGATIVE 03/30/2017 2130   BILIRUBINUR NEGATIVE 03/30/2017 2130   KETONESUR 5 (A) 03/30/2017 2130   PROTEINUR 30 (A) 03/30/2017 2130   UROBILINOGEN 0.2 08/20/2014 1613   NITRITE NEGATIVE 03/30/2017 2130   LEUKOCYTESUR NEGATIVE 03/30/2017 2130   Sepsis Labs: _0 (procalcitonin:4,lacticidven:4) )No results found for this or any previous visit (from the past 240 hour(s)).   Scheduled Meds: . enoxaparin (LOVENOX) injection  30 mg Subcutaneous Q24H  . levothyroxine  25 mcg Intravenous QAC breakfast   Continuous Infusions: . sodium chloride 100 mL/hr at 03/31/17 0208    Procedures/Studies: Ct Abdomen Pelvis W Contrast  Result Date: 03/30/2017 CLINICAL DATA:  Abdominal pain with bloating.  Nausea and vomiting. EXAM: CT ABDOMEN AND PELVIS WITH CONTRAST TECHNIQUE: Multidetector CT imaging of the abdomen and pelvis was performed using the standard protocol following bolus administration of intravenous contrast. CONTRAST:  40m ISOVUE-300 IOPAMIDOL (ISOVUE-300) INJECTION 61%, 1034mISOVUE-300 IOPAMIDOL (ISOVUE-300) INJECTION 61% COMPARISON:  08/15/2007 FINDINGS: Lower chest: Tubular branching structures posterior lung bases suggest impacted airways but difficult to assess given the substantial breathing motion artifact. Hepatobiliary: 7 mm hypervascular lesion subcapsular left liver cannot be further characterized. Mild intrahepatic biliary duct prominence. Gallbladder unremarkable. Extrahepatic common bile duct  measures 8 mm diameter, increased in the interval. Pancreas: No focal mass lesion. No dilatation of the main duct. No intraparenchymal cyst. No peripancreatic edema. Spleen: No splenomegaly. No focal mass lesion. Adrenals/Urinary Tract: No adrenal nodule or mass. Cortical scarring noted both kidneys with small renal cysts noted bilaterally. No evidence for hydroureter. Bladder is distended. Stomach/Bowel: Stomach is mildly distended. Duodenum unremarkable. Small bowel loops in the lower abdomen and pelvis are fluid-filled and dilated. A prominently dilated loop in the mid pelvis measures up to 4.6 cm diameter. This dilated loop pin tracts down to the pelvis where a discrete transition point is identified in the right pelvis (see image 53 series 2 and also visible on sagittal image 52 of series 6). I cannot identify dilated small bowel beyond this transition zone to suggest a closed loop etiology. The terminal ileum is collapsed. The appendix is not visualized, but there is no edema or inflammation in the region of the cecum. Diverticuli are seen scattered along the entire length of the colon without CT findings of diverticulitis. Vascular/Lymphatic: There is abdominal aortic atherosclerosis without aneurysm. There is no gastrohepatic or hepatoduodenal ligament lymphadenopathy.  No intraperitoneal or retroperitoneal lymphadenopathy. No pelvic sidewall lymphadenopathy. Reproductive: Uterus is surgically absent. There is no adnexal mass. Other: Free fluid is identified around the liver and spleen. Interloop mesenteric fluid is identified in the central small bowel mesentery. Musculoskeletal: Bone windows reveal no worrisome lytic or sclerotic osseous lesions. IMPRESSION: 1. Mechanical small bowel obstruction with small bowel loops dilated up to 4.6 cm diameter and a definite transition zone identified in the right pelvis. Although there is some question of twisting in the region of the transition zone, there is no  second transition zone identified nor dilated small bowel after the transition zone to suggest a closed loop obstruction. As such, imaging features likely related to an adhesion given the history of hysterectomy. There is fluid around the liver and spleen with interloop mesenteric fluid associated with the abnormal small bowel loops. No small bowel wall thickening or pneumatosis at this time to suggest overt bowel ischemia. 2. Probable airway impaction in both posterior lower lobes although not well assessed given motion artifact. Electronically Signed   By: Misty Stanley M.D.   On: 03/30/2017 21:51   Dg Chest Portable 1 View  Result Date: 03/30/2017 CLINICAL DATA:  Nasogastric tube placement. Generalized abdominal pain and bloating, acute onset. Initial encounter. EXAM: PORTABLE CHEST 1 VIEW COMPARISON:  Chest radiograph performed 06/10/2016 FINDINGS: The patient's enteric tube is seen extending below the diaphragm. The lungs are hyperexpanded, with biapical scarring. Vascular congestion is noted, with increased interstitial markings. Mild interstitial edema cannot be excluded. No pleural effusion or pneumothorax is seen. The cardiomediastinal silhouette is borderline normal in size. No acute osseous abnormalities are seen. Mild degenerative change is noted at the glenohumeral joints bilaterally. IMPRESSION: 1. Enteric tube noted extending below the diaphragm. 2. Lungs hyperexpanded, with biapical scarring, likely reflecting COPD. Underlying vascular congestion, with increased interstitial markings. Mild interstitial edema cannot be excluded. Electronically Signed   By: Garald Balding M.D.   On: 03/30/2017 23:55    Lena Gores, DO  [facility] Pager [phone]  If 7PM-7AM, please contact night-coverage [email or web address] Password [email or web address] 03/31/2017, 9:29 AM   LOS: 0 days

## 2017-04-01 ENCOUNTER — Inpatient Hospital Stay (HOSPITAL_COMMUNITY): Payer: Medicare Other

## 2017-04-01 LAB — BASIC METABOLIC PANEL
ANION GAP: 10 (ref 5–15)
BUN: 34 mg/dL — ABNORMAL HIGH (ref 6–20)
CALCIUM: 8.3 mg/dL — AB (ref 8.9–10.3)
CO2: 30 mmol/L (ref 22–32)
Chloride: 100 mmol/L — ABNORMAL LOW (ref 101–111)
Creatinine, Ser: 1.08 mg/dL — ABNORMAL HIGH (ref 0.44–1.00)
GFR, EST AFRICAN AMERICAN: 52 mL/min — AB (ref >60)
GFR, EST NON AFRICAN AMERICAN: 45 mL/min — AB (ref >60)
Glucose, Bld: 76 mg/dL (ref 65–99)
Potassium: 3.8 mmol/L (ref 3.5–5.1)
SODIUM: 140 mmol/L (ref 135–145)

## 2017-04-01 LAB — MAGNESIUM: MAGNESIUM: 1.8 mg/dL (ref 1.7–2.4)

## 2017-04-01 MED ORDER — MILK AND MOLASSES ENEMA
1.0000 | Freq: Once | RECTAL | Status: AC
  Administered 2017-04-01: 250 mL via RECTAL

## 2017-04-01 MED ORDER — POTASSIUM CHLORIDE IN NACL 20-0.9 MEQ/L-% IV SOLN
INTRAVENOUS | Status: DC
  Administered 2017-04-02: 10:00:00 via INTRAVENOUS

## 2017-04-01 MED ORDER — HYDRALAZINE HCL 20 MG/ML IJ SOLN
5.0000 mg | Freq: Three times a day (TID) | INTRAMUSCULAR | Status: DC | PRN
  Filled 2017-04-01: qty 1

## 2017-04-01 NOTE — Progress Notes (Signed)
PROGRESS NOTE  Andrea [name] OBS:[id number] DOB: 12/24/1928 DOA: 03/30/2017 PCP: Unk Pinto, MD  Brief History:  83 year old female with a history of bronchiectasis/COPD, chronic respiratory failure on 2 L at night, hypertension, hyperlipidemia, diastolic CHF, hypothyroidism, and PAT presented with a one to two-day history of abdominal pain with associated nausea and vomiting. The patient denied any fevers, chills, chest pain, shortness breath, diarrhea, hematochezia, melena. CT of the abdomen and pelvis at the time of admission showed dilated small bowel loops up to 4.6 cm in the mid pelvis with a transition zone in the right pelvis. NG tube was inserted for decompression and Gen. surgery was consulted to assist with management.  Assessment/Plan: Small bowel obstruction -Likely due to adhesions -Continue NG tube decompression-->1L output in past 24 hours -Appreciate general surgery-->continue nonsurgical management -remain NPO -continue IVF--decrease rate -7/6--personally reviewed AXR--persistent SBO  COPD/bronchiectasis/chronic respiratory failure with hypoxia -On 2 L nasal cannula and nighttime at home -Presently stable on 2 L  Chronic diastolic CHF -Daily weights -Decrease IV fluids -Dry weight approximately 104 pounds  Essential hypertension -Holding bisoprolol/HCTZ until able to tolerate po reliably -Holding amlodipine and losartan until able to reliably tolerate po -hydralazine IV prn SBP >180  Anxiety -IV ativan q 8 hrs prn until able to take po xanax  Hypothyroidism -Continue Synthroid IV  Leukocytosis -Likely secondary to stress demargination -Patient is afebrile and hemodynamically stable -Remain off antibiotics    Disposition Plan:   Home in 2-3 days  Family Communication:   Daughters updated at bedside 7/6--Total time spent 40 minutes.  Greater than 50% spent face to face counseling and coordinating care.   Consultants:  General  surgery  Code Status:  FULL   DVT Prophylaxis:  [place] Lovenox   Procedures: As Listed in Progress Note Above  Antibiotics: None  Subjective: Patient says her abdominal pain is a little better. There is no emesis in the last 24 hours. She is not passing any flatus nor she had a bowel movement. She denies any chest pain, shortness breath, headache, fever, chills.  Objective: Vitals:   03/31/17 2107 04/01/17 0641 04/01/17 0649 04/01/17 1450  BP: (!) 133/55 133/60  (!) 134/45  Pulse: 87 84  87  Resp: _0 Temp: 98.6 F (37 C) 98.2 F (36.8 C)  (!) 97.5 F (36.4 C)  TempSrc: Oral Oral  Oral  SpO2: 98% 97%  100%  Weight:   45.7 kg (100 lb 12 oz)   Height:        Intake/Output Summary (Last 24 hours) at 04/01/17 1727 Last data filed at 04/01/17 1400  Gross per 24 hour  Intake              980 ml  Output             2550 ml  Net            -1570 ml   Weight change: -0.567 kg (-1 lb 4 oz) Exam:   General:  Pt is alert, follows commands appropriately, not in acute distress  HEENT: No icterus, No thrush, No neck mass, [place]/AT  Cardiovascular: RRR, S1/S2, no rubs, no gallops  Respiratory: Points to her effort, but CTA bilaterally, no wheezing, no crackles, no rhonchi  Abdomen: Soft/+BS, non tender, non distended, no guarding  Extremities: No edema, No lymphangitis, No petechiae, No rashes, no synovitis   Data Reviewed: I have personally reviewed following labs and  imaging studies Basic Metabolic Panel:  Recent Labs Lab 03/30/17 1850 03/31/17 0630 04/01/17 0548  NA 134* 134* 140  K 4.0 3.8 3.8  CL 91* 94* 100*  CO2 30 32 30  GLUCOSE 122* 102* 76  BUN 31* 28* 34*  CREATININE 1.05* 1.04* 1.08*  CALCIUM 9.9 8.7* 8.3*  MG  --   --  1.8   Liver Function Tests:  Recent Labs Lab 03/30/17 1850 03/31/17 0630  AST 30 19  ALT 12* 9*  ALKPHOS 75 59  BILITOT 1.0 1.0  PROT 8.3* 6.4*  ALBUMIN 4.2 3.3*    Recent Labs Lab 03/30/17 1850  LIPASE 47    No results for input(s): AMMONIA in the last 168 hours. Coagulation Profile: No results for input(s): INR, PROTIME in the last 168 hours. CBC:  Recent Labs Lab 03/30/17 1850 03/31/17 0630  WBC 13.6* 11.9*  HGB 13.4 11.8*  HCT 40.8 37.2  MCV 92.1 93.0  PLT 269 231   Cardiac Enzymes: No results for input(s): CKTOTAL, CKMB, CKMBINDEX, TROPONINI in the last 168 hours. BNP: Invalid input(s): POCBNP CBG: No results for input(s): GLUCAP in the last 168 hours. HbA1C: No results for input(s): HGBA1C in the last 72 hours. Urine analysis:    Component Value Date/Time   COLORURINE YELLOW 03/30/2017 2130   APPEARANCEUR CLEAR 03/30/2017 2130   LABSPEC 1.015 03/30/2017 2130   PHURINE 7.0 03/30/2017 2130   GLUCOSEU NEGATIVE 03/30/2017 2130   HGBUR NEGATIVE 03/30/2017 2130   BILIRUBINUR NEGATIVE 03/30/2017 2130   KETONESUR 5 (A) 03/30/2017 2130   PROTEINUR 30 (A) 03/30/2017 2130   UROBILINOGEN 0.2 08/20/2014 1613   NITRITE NEGATIVE 03/30/2017 2130   LEUKOCYTESUR NEGATIVE 03/30/2017 2130   Sepsis Labs: _0 (procalcitonin:4,lacticidven:4) )No results found for this or any previous visit (from the past 240 hour(s)).   Scheduled Meds: . enoxaparin (LOVENOX) injection  30 mg Subcutaneous Q24H  . levothyroxine  25 mcg Intravenous QAC breakfast   Continuous Infusions: . sodium chloride 100 mL/hr at 04/01/17 1723    Procedures/Studies: Dg Abd 1 View  Result Date: 04/01/2017 CLINICAL DATA:  NG tube position. EXAM: ABDOMEN - 1 VIEW COMPARISON:  04/01/2017 FINDINGS: NG tube tip is in the mid stomach with the side port in the proximal stomach. IMPRESSION: NG tube tip in the mid stomach. Electronically Signed   By: Rolm Baptise M.D.   On: 04/01/2017 11:49   Ct Abdomen Pelvis W Contrast  Result Date: 03/30/2017 CLINICAL DATA:  Abdominal pain with bloating.  Nausea and vomiting. EXAM: CT ABDOMEN AND PELVIS WITH CONTRAST TECHNIQUE: Multidetector CT imaging of the abdomen and pelvis  was performed using the standard protocol following bolus administration of intravenous contrast. CONTRAST:  58m ISOVUE-300 IOPAMIDOL (ISOVUE-300) INJECTION 61%, 1042mISOVUE-300 IOPAMIDOL (ISOVUE-300) INJECTION 61% COMPARISON:  08/15/2007 FINDINGS: Lower chest: Tubular branching structures posterior lung bases suggest impacted airways but difficult to assess given the substantial breathing motion artifact. Hepatobiliary: 7 mm hypervascular lesion subcapsular left liver cannot be further characterized. Mild intrahepatic biliary duct prominence. Gallbladder unremarkable. Extrahepatic common bile duct measures 8 mm diameter, increased in the interval. Pancreas: No focal mass lesion. No dilatation of the main duct. No intraparenchymal cyst. No peripancreatic edema. Spleen: No splenomegaly. No focal mass lesion. Adrenals/Urinary Tract: No adrenal nodule or mass. Cortical scarring noted both kidneys with small renal cysts noted bilaterally. No evidence for hydroureter. Bladder is distended. Stomach/Bowel: Stomach is mildly distended. Duodenum unremarkable. Small bowel loops in the lower abdomen and pelvis are fluid-filled and dilated.  A prominently dilated loop in the mid pelvis measures up to 4.6 cm diameter. This dilated loop pin tracts down to the pelvis where a discrete transition point is identified in the right pelvis (see image 53 series 2 and also visible on sagittal image 52 of series 6). I cannot identify dilated small bowel beyond this transition zone to suggest a closed loop etiology. The terminal ileum is collapsed. The appendix is not visualized, but there is no edema or inflammation in the region of the cecum. Diverticuli are seen scattered along the entire length of the colon without CT findings of diverticulitis. Vascular/Lymphatic: There is abdominal aortic atherosclerosis without aneurysm. There is no gastrohepatic or hepatoduodenal ligament lymphadenopathy. No intraperitoneal or retroperitoneal  lymphadenopathy. No pelvic sidewall lymphadenopathy. Reproductive: Uterus is surgically absent. There is no adnexal mass. Other: Free fluid is identified around the liver and spleen. Interloop mesenteric fluid is identified in the central small bowel mesentery. Musculoskeletal: Bone windows reveal no worrisome lytic or sclerotic osseous lesions. IMPRESSION: 1. Mechanical small bowel obstruction with small bowel loops dilated up to 4.6 cm diameter and a definite transition zone identified in the right pelvis. Although there is some question of twisting in the region of the transition zone, there is no second transition zone identified nor dilated small bowel after the transition zone to suggest a closed loop obstruction. As such, imaging features likely related to an adhesion given the history of hysterectomy. There is fluid around the liver and spleen with interloop mesenteric fluid associated with the abnormal small bowel loops. No small bowel wall thickening or pneumatosis at this time to suggest overt bowel ischemia. 2. Probable airway impaction in both posterior lower lobes although not well assessed given motion artifact. Electronically Signed   By: Misty Stanley M.D.   On: 03/30/2017 21:51   Dg Chest Portable 1 View  Result Date: 03/30/2017 CLINICAL DATA:  Nasogastric tube placement. Generalized abdominal pain and bloating, acute onset. Initial encounter. EXAM: PORTABLE CHEST 1 VIEW COMPARISON:  Chest radiograph performed 06/10/2016 FINDINGS: The patient's enteric tube is seen extending below the diaphragm. The lungs are hyperexpanded, with biapical scarring. Vascular congestion is noted, with increased interstitial markings. Mild interstitial edema cannot be excluded. No pleural effusion or pneumothorax is seen. The cardiomediastinal silhouette is borderline normal in size. No acute osseous abnormalities are seen. Mild degenerative change is noted at the glenohumeral joints bilaterally. IMPRESSION: 1.  Enteric tube noted extending below the diaphragm. 2. Lungs hyperexpanded, with biapical scarring, likely reflecting COPD. Underlying vascular congestion, with increased interstitial markings. Mild interstitial edema cannot be excluded. Electronically Signed   By: Garald Balding M.D.   On: 03/30/2017 23:55   Dg Abd 2 Views  Result Date: 04/01/2017 CLINICAL DATA:  Followup small bowel obstruction. EXAM: ABDOMEN - 2 VIEW COMPARISON:  03/31/2017 FINDINGS: There are persistent loops of dilated central small bowel with air-fluid levels consistent with a partial small bowel obstruction, similar to the prior study. There is no free air. Nasogastric tube extends below the diaphragm into the mid stomach. IMPRESSION: 1. Persistent partial small bowel obstruction. No significant change from the prior study. No free air. Electronically Signed   By: Lajean Manes M.D.   On: 04/01/2017 08:42   Dg Abd 2 Views  Result Date: 03/31/2017 CLINICAL DATA:  Follow-up small bowel obstruction. EXAM: ABDOMEN - 2 VIEW COMPARISON:  03/30/2017 abdominal CT FINDINGS: An NG tube is identified with tip overlying the proximal-mid stomach. Dilated small bowel loops are again noted and  not significantly changed. There is no evidence of pneumoperitoneum. No other changes identified. IMPRESSION: Little significant change of dilated small bowel loops compatible with small bowel obstruction. No evidence of pneumoperitoneum. NG tube with tip overlying the proximal-mid stomach. Electronically Signed   By: Margarette Canada M.D.   On: 03/31/2017 21:12    Ruchama Kubicek, DO  [facility] Pager [phone]  If 7PM-7AM, please contact night-coverage [email or web address] Password [email or web address] 04/01/2017, 5:27 PM   LOS: 1 day

## 2017-04-01 NOTE — Care Management Important Message (Signed)
Important Message  Patient Details  Name: Andrea [name] MRN: [id number] Date of Birth: [birth year]   Medicare Important Message Given:  Yes    Sherald Barge, RN 04/01/2017, 1:39 PM

## 2017-04-01 NOTE — Progress Notes (Signed)
Results from enema one moderate size hard stool.

## 2017-04-01 NOTE — Progress Notes (Signed)
Subjective: No bowel movement or flatus yet. No significant abdominal pain.  Objective: Vital signs in last 24 hours: Temp:  [98.2 F (36.8 C)-98.6 F (37 C)] 98.2 F (36.8 C) (07/06 0641) Pulse Rate:  [84-87] 84 (07/06 0641) Resp:  [18] 18 (07/06 0641) BP: (133-143)/(55-60) 133/60 (07/06 0641) SpO2:  [97 %-98 %] 97 % (07/06 0641) Weight:  [100 lb 12 oz (45.7 kg)] 100 lb 12 oz (45.7 kg) (07/06 0649)    Intake/Output from previous day: 07/05 0701 - 07/06 0700 In: 1666.7 [I.V.:1666.7] Out: 1000 [Emesis/NG output:1000] Intake/Output this shift: Total I/O In: -  Out: 350 [Urine:350]  General appearance: alert, cooperative and no distress GI: , Distended. Occasional bowel sounds appreciated. No rigidity noted. No tenderness noted. KUB shows air in the colon. She still has dilated bowel. Lab Results:   Recent Labs  03/30/17 1850 03/31/17 0630  WBC 13.6* 11.9*  HGB 13.4 11.8*  HCT 40.8 37.2  PLT 269 231   BMET  Recent Labs  03/31/17 0630 04/01/17 0548  NA 134* 140  K 3.8 3.8  CL 94* 100*  CO2 32 30  GLUCOSE 102* 76  BUN 28* 34*  CREATININE 1.04* 1.08*  CALCIUM 8.7* 8.3*   PT/INR No results for input(s): LABPROT, INR in the last 72 hours.  Studies/Results: Ct Abdomen Pelvis W Contrast  Result Date: 03/30/2017 CLINICAL DATA:  Abdominal pain with bloating.  Nausea and vomiting. EXAM: CT ABDOMEN AND PELVIS WITH CONTRAST TECHNIQUE: Multidetector CT imaging of the abdomen and pelvis was performed using the standard protocol following bolus administration of intravenous contrast. CONTRAST:  64m ISOVUE-300 IOPAMIDOL (ISOVUE-300) INJECTION 61%, 1079mISOVUE-300 IOPAMIDOL (ISOVUE-300) INJECTION 61% COMPARISON:  08/15/2007 FINDINGS: Lower chest: Tubular branching structures posterior lung bases suggest impacted airways but difficult to assess given the substantial breathing motion artifact. Hepatobiliary: 7 mm hypervascular lesion subcapsular left liver cannot be further  characterized. Mild intrahepatic biliary duct prominence. Gallbladder unremarkable. Extrahepatic common bile duct measures 8 mm diameter, increased in the interval. Pancreas: No focal mass lesion. No dilatation of the main duct. No intraparenchymal cyst. No peripancreatic edema. Spleen: No splenomegaly. No focal mass lesion. Adrenals/Urinary Tract: No adrenal nodule or mass. Cortical scarring noted both kidneys with small renal cysts noted bilaterally. No evidence for hydroureter. Bladder is distended. Stomach/Bowel: Stomach is mildly distended. Duodenum unremarkable. Small bowel loops in the lower abdomen and pelvis are fluid-filled and dilated. A prominently dilated loop in the mid pelvis measures up to 4.6 cm diameter. This dilated loop pin tracts down to the pelvis where a discrete transition point is identified in the right pelvis (see image 53 series 2 and also visible on sagittal image 52 of series 6). I cannot identify dilated small bowel beyond this transition zone to suggest a closed loop etiology. The terminal ileum is collapsed. The appendix is not visualized, but there is no edema or inflammation in the region of the cecum. Diverticuli are seen scattered along the entire length of the colon without CT findings of diverticulitis. Vascular/Lymphatic: There is abdominal aortic atherosclerosis without aneurysm. There is no gastrohepatic or hepatoduodenal ligament lymphadenopathy. No intraperitoneal or retroperitoneal lymphadenopathy. No pelvic sidewall lymphadenopathy. Reproductive: Uterus is surgically absent. There is no adnexal mass. Other: Free fluid is identified around the liver and spleen. Interloop mesenteric fluid is identified in the central small bowel mesentery. Musculoskeletal: Bone windows reveal no worrisome lytic or sclerotic osseous lesions. IMPRESSION: 1. Mechanical small bowel obstruction with small bowel loops dilated up to 4.6 cm diameter  and a definite transition zone identified in the  right pelvis. Although there is some question of twisting in the region of the transition zone, there is no second transition zone identified nor dilated small bowel after the transition zone to suggest a closed loop obstruction. As such, imaging features likely related to an adhesion given the history of hysterectomy. There is fluid around the liver and spleen with interloop mesenteric fluid associated with the abnormal small bowel loops. No small bowel wall thickening or pneumatosis at this time to suggest overt bowel ischemia. 2. Probable airway impaction in both posterior lower lobes although not well assessed given motion artifact. Electronically Signed   By: Misty Stanley M.D.   On: 03/30/2017 21:51   Dg Chest Portable 1 View  Result Date: 03/30/2017 CLINICAL DATA:  Nasogastric tube placement. Generalized abdominal pain and bloating, acute onset. Initial encounter. EXAM: PORTABLE CHEST 1 VIEW COMPARISON:  Chest radiograph performed 06/10/2016 FINDINGS: The patient's enteric tube is seen extending below the diaphragm. The lungs are hyperexpanded, with biapical scarring. Vascular congestion is noted, with increased interstitial markings. Mild interstitial edema cannot be excluded. No pleural effusion or pneumothorax is seen. The cardiomediastinal silhouette is borderline normal in size. No acute osseous abnormalities are seen. Mild degenerative change is noted at the glenohumeral joints bilaterally. IMPRESSION: 1. Enteric tube noted extending below the diaphragm. 2. Lungs hyperexpanded, with biapical scarring, likely reflecting COPD. Underlying vascular congestion, with increased interstitial markings. Mild interstitial edema cannot be excluded. Electronically Signed   By: Garald Balding M.D.   On: 03/30/2017 23:55   Dg Abd 2 Views  Result Date: 04/01/2017 CLINICAL DATA:  Followup small bowel obstruction. EXAM: ABDOMEN - 2 VIEW COMPARISON:  03/31/2017 FINDINGS: There are persistent loops of dilated central  small bowel with air-fluid levels consistent with a partial small bowel obstruction, similar to the prior study. There is no free air. Nasogastric tube extends below the diaphragm into the mid stomach. IMPRESSION: 1. Persistent partial small bowel obstruction. No significant change from the prior study. No free air. Electronically Signed   By: Lajean Manes M.D.   On: 04/01/2017 08:42   Dg Abd 2 Views  Result Date: 03/31/2017 CLINICAL DATA:  Follow-up small bowel obstruction. EXAM: ABDOMEN - 2 VIEW COMPARISON:  03/30/2017 abdominal CT FINDINGS: An NG tube is identified with tip overlying the proximal-mid stomach. Dilated small bowel loops are again noted and not significantly changed. There is no evidence of pneumoperitoneum. No other changes identified. IMPRESSION: Little significant change of dilated small bowel loops compatible with small bowel obstruction. No evidence of pneumoperitoneum. NG tube with tip overlying the proximal-mid stomach. Electronically Signed   By: Margarette Canada M.D.   On: 03/31/2017 21:12    Anti-infectives: Anti-infectives    None      Assessment/Plan: Impression: Still with small bowel obstruction. She has had significant NG tube output over the past 24 hours. Will write for enema. Will continue to follow.  LOS: 1 day    Aviva Signs 04/01/2017

## 2017-04-02 ENCOUNTER — Inpatient Hospital Stay (HOSPITAL_COMMUNITY): Payer: Medicare Other

## 2017-04-02 DIAGNOSIS — I509 Heart failure, unspecified: Secondary | ICD-10-CM

## 2017-04-02 DIAGNOSIS — J9610 Chronic respiratory failure, unspecified whether with hypoxia or hypercapnia: Secondary | ICD-10-CM

## 2017-04-02 DIAGNOSIS — R0902 Hypoxemia: Secondary | ICD-10-CM

## 2017-04-02 DIAGNOSIS — I471 Supraventricular tachycardia: Secondary | ICD-10-CM

## 2017-04-02 LAB — CBC
HCT: 38 % (ref 36.0–46.0)
Hemoglobin: 11.6 g/dL — ABNORMAL LOW (ref 12.0–15.0)
MCH: 29.6 pg (ref 26.0–34.0)
MCHC: 30.5 g/dL (ref 30.0–36.0)
MCV: 96.9 fL (ref 78.0–100.0)
Platelets: 170 10*3/uL (ref 150–400)
RBC: 3.92 MIL/uL (ref 3.87–5.11)
RDW: 12.9 % (ref 11.5–15.5)
WBC: 14.2 10*3/uL — AB (ref 4.0–10.5)

## 2017-04-02 LAB — BASIC METABOLIC PANEL
Anion gap: 8 (ref 5–15)
BUN: 25 mg/dL — AB (ref 6–20)
CALCIUM: 8.4 mg/dL — AB (ref 8.9–10.3)
CO2: 32 mmol/L (ref 22–32)
Chloride: 105 mmol/L (ref 101–111)
Creatinine, Ser: 0.81 mg/dL (ref 0.44–1.00)
GFR calc non Af Amer: >60 mL/min (ref >60)
Glucose, Bld: 77 mg/dL (ref 65–99)
Potassium: 3.3 mmol/L — ABNORMAL LOW (ref 3.5–5.1)
SODIUM: 145 mmol/L (ref 135–145)

## 2017-04-02 LAB — PROCALCITONIN

## 2017-04-02 LAB — BRAIN NATRIURETIC PEPTIDE: B NATRIURETIC PEPTIDE 5: 1075 pg/mL — AB (ref 0.0–100.0)

## 2017-04-02 LAB — MAGNESIUM: Magnesium: 1.7 mg/dL (ref 1.7–2.4)

## 2017-04-02 MED ORDER — FUROSEMIDE 10 MG/ML IJ SOLN
40.0000 mg | Freq: Once | INTRAMUSCULAR | Status: AC
Start: 2017-04-02 — End: 2017-04-02
  Administered 2017-04-02: 40 mg via INTRAVENOUS
  Filled 2017-04-02: qty 4

## 2017-04-02 MED ORDER — ENOXAPARIN SODIUM 40 MG/0.4ML ~~LOC~~ SOLN
40.0000 mg | SUBCUTANEOUS | Status: DC
  Administered 2017-04-03: 40 mg via SUBCUTANEOUS
  Filled 2017-04-02: qty 0.4

## 2017-04-02 MED ORDER — METOPROLOL TARTRATE 5 MG/5ML IV SOLN: 5.0000 mg | INTRAVENOUS | Status: DC

## 2017-04-02 MED ORDER — METOPROLOL TARTRATE 5 MG/5ML IV SOLN
2.5000 mg | Freq: Four times a day (QID) | INTRAVENOUS | Status: DC
  Administered 2017-04-02 – 2017-04-05 (×14): 2.5 mg via INTRAVENOUS
  Filled 2017-04-02 (×14): qty 5

## 2017-04-02 MED ORDER — POTASSIUM CHLORIDE 2 MEQ/ML IV SOLN: INTRAVENOUS | Status: DC

## 2017-04-02 MED ORDER — DILTIAZEM LOAD VIA INFUSION
10.0000 mg | Freq: Once | INTRAVENOUS | Status: DC
  Filled 2017-04-02: qty 10

## 2017-04-02 MED ORDER — FUROSEMIDE 10 MG/ML IJ SOLN
40.0000 mg | Freq: Every day | INTRAMUSCULAR | Status: DC
  Administered 2017-04-03 – 2017-04-05 (×3): 40 mg via INTRAVENOUS
  Filled 2017-04-02 (×4): qty 4

## 2017-04-02 MED ORDER — POTASSIUM CHLORIDE 10 MEQ/100ML IV SOLN
10.0000 meq | INTRAVENOUS | Status: AC
  Administered 2017-04-02 (×4): 10 meq via INTRAVENOUS
  Filled 2017-04-02 (×4): qty 100

## 2017-04-02 MED ORDER — DILTIAZEM HCL 100 MG IV SOLR
5.0000 mg/h | INTRAVENOUS | Status: DC
  Administered 2017-04-02 – 2017-04-03 (×5): 15 mg/h via INTRAVENOUS
  Administered 2017-04-04: 10 mg/h via INTRAVENOUS
  Administered 2017-04-04: 5 mg/h via INTRAVENOUS
  Administered 2017-04-04 – 2017-04-05 (×2): 10 mg/h via INTRAVENOUS
  Filled 2017-04-02 (×9): qty 100

## 2017-04-02 MED ORDER — KCL IN DEXTROSE-NACL 20-5-0.45 MEQ/L-%-% IV SOLN
INTRAVENOUS | Status: DC
  Administered 2017-04-02: 11:00:00 via INTRAVENOUS

## 2017-04-02 MED ORDER — MAGNESIUM SULFATE 2 GM/50ML IV SOLN
2.0000 g | Freq: Once | INTRAVENOUS | Status: AC
Start: 2017-04-02 — End: 2017-04-02
  Administered 2017-04-02: 2 g via INTRAVENOUS
  Filled 2017-04-02: qty 50

## 2017-04-02 MED ORDER — DILTIAZEM HCL 100 MG IV SOLR: 5.0000 mg/h | INTRAVENOUS | Status: DC

## 2017-04-02 NOTE — Progress Notes (Signed)
Patient in ST 170 unsustained BP 110/62 asymptomatic no noted distress.reported to Dr Tat . No new orders given. Dr Tat states he will be coming to see the patient

## 2017-04-02 NOTE — Progress Notes (Signed)
Pt transported to ICU room 9.  Pt family aware of transfer to new room

## 2017-04-02 NOTE — Progress Notes (Addendum)
Noted patient had formed medium bowel movement, greenish in color. Patient states " she has been passing gas".  Patient ambulated to nurses station, [employer] called to inform this nurse, patient HR was in the 200.  MD notified.

## 2017-04-02 NOTE — Progress Notes (Addendum)
Pt is running AFib with rate sustaining in 150 - 160s.  BP 129/46 MAP 60.  Pt is resting in bed, asymptomatic.  Text paged Dr Tat via Shea Evans.   Dr Tat returned page. Verbal order for STAT EKG given.  Dr Tat to place orders to transfer pt with Cardizem Gtt

## 2017-04-02 NOTE — Progress Notes (Addendum)
PROGRESS NOTE  DANETT PALAZZO ELT:[id number] DOB: [birth year] DOA: 03/30/2017 PCP: Unk Pinto, MD  Brief History: 83 year old female with a history of bronchiectasis/COPD, chronic respiratory failure on 2 L at night, hypertension, hyperlipidemia, diastolic CHF, hypothyroidism, and PATpresented with a one to two-day history of abdominal pain with associated nausea and vomiting. The patient denied any fevers, chills, chest pain, shortness breath, diarrhea, hematochezia, melena. CT of the abdomen and pelvis at the time of admission showed dilated small bowel loops up to 4.6 cm in the mid pelvis with a transition zone in the right pelvis. NG tube was inserted for decompression and Gen. surgery was consulted to assist with management.  Assessment/Plan: Small bowel obstruction -Likely due to adhesions -Continue NG tube decompression-->800cc output in past 24 hours -Appreciate general surgery-->continue nonsurgical management -remain NPO -continue IVF--change to hypotonic fluid due to rising Na, add KCl to IVF -7/6--personally reviewed AXR--persistent SBO -am BMP  SVT/Atrial tachycardia -start IV lopressor -telemetry reviewed -04/22/2016 at EF 60-65%, grade 2 DD, no WMA, PASP 55  COPD/bronchiectasis/chronic respiratory failure with hypoxia -On 2 L nasal cannula and nighttime at home -Presently stable on 2 L  Acute on Chronic diastolic CHF -Daily weights -04/02/17 CXR--bilateral pleural effusions, increase vascular congestion -lasix IV 40 mg x 1 -Decrease IV fluids -Dry weight approximately 104 pounds  Essential hypertension -Holding bisoprolol/HCTZ until able to tolerate poreliably -Holding amlodipine and losartan until able to reliably tolerate po -hydralazine IV prn SBP >180  Anxiety -IV ativan q 8 hrs prn until able to take po xanax  Hypothyroidism -Continue Synthroid IV  Leukocytosis -Likely secondary to stress demargination -Patient is afebrile and  hemodynamically stable -Remain off antibiotics -UA neg for pyuria -am CBC    Disposition Plan: Home in 2-3 days  Family Communication: Daughters updatedat bedside 7/7--Total time spent 35 minutes.  Greater than 50% spent face to face counseling and coordinating care.    Consultants: General surgery  Code Status: FULL   DVT Prophylaxis: [place] Lovenox   Procedures: As Listed in Progress Note Above  Antibiotics: None   Subjective: Patient states that she has passed flatus. She had one bowel movement with the enema. Denies any chest pain, transplant, abdominal pain, hematochezia, melena. Denies headache, fevers, chills.  Patient has had some palpitations in the last 24 hours.  Objective: Vitals:   04/01/17 0649 04/01/17 1450 04/02/17 0527 04/02/17 0555  BP:  (!) 134/45 126/68 110/68  Pulse:  87 98   Resp:  19    Temp:  (!) 97.5 F (36.4 C) 98.8 F (37.1 C) 98 F (36.7 C)  TempSrc:  Oral Oral Oral  SpO2:  100% 99%   Weight: 45.7 kg (100 lb 12 oz)     Height:        Intake/Output Summary (Last 24 hours) at 04/02/17 0826 Last data filed at 04/02/17 0528  Gross per 24 hour  Intake              500 ml  Output             2351 ml  Net            -1851 ml   Weight change:  Exam:   General:  Pt is alert, follows commands appropriately, not in acute distress  HEENT: No icterus, No thrush, No neck mass, [place]/AT  Cardiovascular: RRR, S1/S2, no rubs, no gallops  Respiratory: Bibasilar crackles. No wheezing. Good air movement.  Abdomen:  Soft/+BS, non tender, non distended, no guarding  Extremities: trace LE edema, No lymphangitis, No petechiae, No rashes, no synovitis   Data Reviewed: I have personally reviewed following labs and imaging studies Basic Metabolic Panel:  Recent Labs Lab 03/30/17 1850 03/31/17 0630 04/01/17 0548 04/02/17 0605  NA 134* 134* 140 145  K 4.0 3.8 3.8 3.3*  CL 91* 94* 100* 105  CO2 30 32 30 32  GLUCOSE 122* 102* 76  77  BUN 31* 28* 34* 25*  CREATININE 1.05* 1.04* 1.08* 0.81  CALCIUM 9.9 8.7* 8.3* 8.4*  MG  --   --  1.8 1.7   Liver Function Tests:  Recent Labs Lab 03/30/17 1850 03/31/17 0630  AST 30 19  ALT 12* 9*  ALKPHOS 75 59  BILITOT 1.0 1.0  PROT 8.3* 6.4*  ALBUMIN 4.2 3.3*    Recent Labs Lab 03/30/17 1850  LIPASE 47   No results for input(s): AMMONIA in the last 168 hours. Coagulation Profile: No results for input(s): INR, PROTIME in the last 168 hours. CBC:  Recent Labs Lab 03/30/17 1850 03/31/17 0630 04/02/17 0605  WBC 13.6* 11.9* 14.2*  HGB 13.4 11.8* 11.6*  HCT 40.8 37.2 38.0  MCV 92.1 93.0 96.9  PLT 269 231 170   Cardiac Enzymes: No results for input(s): CKTOTAL, CKMB, CKMBINDEX, TROPONINI in the last 168 hours. BNP: Invalid input(s): POCBNP CBG: No results for input(s): GLUCAP in the last 168 hours. HbA1C: No results for input(s): HGBA1C in the last 72 hours. Urine analysis:    Component Value Date/Time   COLORURINE YELLOW 03/30/2017 2130   APPEARANCEUR CLEAR 03/30/2017 2130   LABSPEC 1.015 03/30/2017 2130   PHURINE 7.0 03/30/2017 2130   GLUCOSEU NEGATIVE 03/30/2017 2130   HGBUR NEGATIVE 03/30/2017 2130   BILIRUBINUR NEGATIVE 03/30/2017 2130   KETONESUR 5 (A) 03/30/2017 2130   PROTEINUR 30 (A) 03/30/2017 2130   UROBILINOGEN 0.2 08/20/2014 1613   NITRITE NEGATIVE 03/30/2017 2130   LEUKOCYTESUR NEGATIVE 03/30/2017 2130   Sepsis Labs: _0 (procalcitonin:4,lacticidven:4) )No results found for this or any previous visit (from the past 240 hour(s)).   Scheduled Meds: . [START ON 04/03/2017] enoxaparin (LOVENOX) injection  40 mg Subcutaneous Q24H  . levothyroxine  25 mcg Intravenous QAC breakfast   Continuous Infusions: . 0.9 % NaCl with KCl 20 mEq / L 75 mL/hr at 04/01/17 1840    Procedures/Studies: Dg Abd 1 View  Result Date: 04/01/2017 CLINICAL DATA:  NG tube position. EXAM: ABDOMEN - 1 VIEW COMPARISON:  04/01/2017 FINDINGS: NG tube tip is  in the mid stomach with the side port in the proximal stomach. IMPRESSION: NG tube tip in the mid stomach. Electronically Signed   By: Rolm Baptise M.D.   On: 04/01/2017 11:49   Ct Abdomen Pelvis W Contrast  Result Date: 03/30/2017 CLINICAL DATA:  Abdominal pain with bloating.  Nausea and vomiting. EXAM: CT ABDOMEN AND PELVIS WITH CONTRAST TECHNIQUE: Multidetector CT imaging of the abdomen and pelvis was performed using the standard protocol following bolus administration of intravenous contrast. CONTRAST:  65m ISOVUE-300 IOPAMIDOL (ISOVUE-300) INJECTION 61%, 1040mISOVUE-300 IOPAMIDOL (ISOVUE-300) INJECTION 61% COMPARISON:  08/15/2007 FINDINGS: Lower chest: Tubular branching structures posterior lung bases suggest impacted airways but difficult to assess given the substantial breathing motion artifact. Hepatobiliary: 7 mm hypervascular lesion subcapsular left liver cannot be further characterized. Mild intrahepatic biliary duct prominence. Gallbladder unremarkable. Extrahepatic common bile duct measures 8 mm diameter, increased in the interval. Pancreas: No focal mass lesion. No dilatation of the main  duct. No intraparenchymal cyst. No peripancreatic edema. Spleen: No splenomegaly. No focal mass lesion. Adrenals/Urinary Tract: No adrenal nodule or mass. Cortical scarring noted both kidneys with small renal cysts noted bilaterally. No evidence for hydroureter. Bladder is distended. Stomach/Bowel: Stomach is mildly distended. Duodenum unremarkable. Small bowel loops in the lower abdomen and pelvis are fluid-filled and dilated. A prominently dilated loop in the mid pelvis measures up to 4.6 cm diameter. This dilated loop pin tracts down to the pelvis where a discrete transition point is identified in the right pelvis (see image 53 series 2 and also visible on sagittal image 52 of series 6). I cannot identify dilated small bowel beyond this transition zone to suggest a closed loop etiology. The terminal ileum is  collapsed. The appendix is not visualized, but there is no edema or inflammation in the region of the cecum. Diverticuli are seen scattered along the entire length of the colon without CT findings of diverticulitis. Vascular/Lymphatic: There is abdominal aortic atherosclerosis without aneurysm. There is no gastrohepatic or hepatoduodenal ligament lymphadenopathy. No intraperitoneal or retroperitoneal lymphadenopathy. No pelvic sidewall lymphadenopathy. Reproductive: Uterus is surgically absent. There is no adnexal mass. Other: Free fluid is identified around the liver and spleen. Interloop mesenteric fluid is identified in the central small bowel mesentery. Musculoskeletal: Bone windows reveal no worrisome lytic or sclerotic osseous lesions. IMPRESSION: 1. Mechanical small bowel obstruction with small bowel loops dilated up to 4.6 cm diameter and a definite transition zone identified in the right pelvis. Although there is some question of twisting in the region of the transition zone, there is no second transition zone identified nor dilated small bowel after the transition zone to suggest a closed loop obstruction. As such, imaging features likely related to an adhesion given the history of hysterectomy. There is fluid around the liver and spleen with interloop mesenteric fluid associated with the abnormal small bowel loops. No small bowel wall thickening or pneumatosis at this time to suggest overt bowel ischemia. 2. Probable airway impaction in both posterior lower lobes although not well assessed given motion artifact. Electronically Signed   By: Misty Stanley M.D.   On: 03/30/2017 21:51   Dg Chest Portable 1 View  Result Date: 03/30/2017 CLINICAL DATA:  Nasogastric tube placement. Generalized abdominal pain and bloating, acute onset. Initial encounter. EXAM: PORTABLE CHEST 1 VIEW COMPARISON:  Chest radiograph performed 06/10/2016 FINDINGS: The patient's enteric tube is seen extending below the diaphragm. The  lungs are hyperexpanded, with biapical scarring. Vascular congestion is noted, with increased interstitial markings. Mild interstitial edema cannot be excluded. No pleural effusion or pneumothorax is seen. The cardiomediastinal silhouette is borderline normal in size. No acute osseous abnormalities are seen. Mild degenerative change is noted at the glenohumeral joints bilaterally. IMPRESSION: 1. Enteric tube noted extending below the diaphragm. 2. Lungs hyperexpanded, with biapical scarring, likely reflecting COPD. Underlying vascular congestion, with increased interstitial markings. Mild interstitial edema cannot be excluded. Electronically Signed   By: Garald Balding M.D.   On: 03/30/2017 23:55   Dg Abd 2 Views  Result Date: 04/01/2017 CLINICAL DATA:  Followup small bowel obstruction. EXAM: ABDOMEN - 2 VIEW COMPARISON:  03/31/2017 FINDINGS: There are persistent loops of dilated central small bowel with air-fluid levels consistent with a partial small bowel obstruction, similar to the prior study. There is no free air. Nasogastric tube extends below the diaphragm into the mid stomach. IMPRESSION: 1. Persistent partial small bowel obstruction. No significant change from the prior study. No free air. Electronically Signed  By: Lajean Manes M.D.   On: 04/01/2017 08:42   Dg Abd 2 Views  Result Date: 03/31/2017 CLINICAL DATA:  Follow-up small bowel obstruction. EXAM: ABDOMEN - 2 VIEW COMPARISON:  03/30/2017 abdominal CT FINDINGS: An NG tube is identified with tip overlying the proximal-mid stomach. Dilated small bowel loops are again noted and not significantly changed. There is no evidence of pneumoperitoneum. No other changes identified. IMPRESSION: Little significant change of dilated small bowel loops compatible with small bowel obstruction. No evidence of pneumoperitoneum. NG tube with tip overlying the proximal-mid stomach. Electronically Signed   By: Margarette Canada M.D.   On: 03/31/2017 21:12    Kataryna Mcquilkin,  Kasir Hallenbeck, DO  [facility] Pager [phone]  If 7PM-7AM, please contact night-coverage [email or web address] Password [email or web address] 04/02/2017, 8:26 AM   LOS: 2 days

## 2017-04-02 NOTE — Progress Notes (Signed)
This RN found 2 pills in a blister package, id as tylenol & phenylephrine.  No hospital ID on package.  No family in room to verify if theirs.  Placed in sharps container, verified by Barbra Sarks RN

## 2017-04-02 NOTE — Progress Notes (Signed)
Patient currently fluctuating bet 106-140 hr unsustained. Hsp paged via Amion and informed vitals wnl bp 108/66 no noted distress.

## 2017-04-02 NOTE — Progress Notes (Signed)
Page by RN for HR in 150-160 BP129/46 Pt asymptomatic and resting  EKG--personally reviewed--Afib with RVR, nonspecific ST-T changes  Start IV diltiazem drip after bolus of diltiazem 41m.  Transfer to stepdown  DTat

## 2017-04-02 NOTE — Progress Notes (Signed)
  Subjective: Patient resting comfortably. No abdominal pain noted. No bowel movement or flatus yet.  Objective: Vital signs in last 24 hours: Temp:  [97.5 F (36.4 C)-98.8 F (37.1 C)] 98 F (36.7 C) (07/07 0555) Pulse Rate:  [87-98] 98 (07/07 0527) Resp:  [19] 19 (07/06 1450) BP: (110-134)/(45-68) 110/68 (07/07 0555) SpO2:  [99 %-100 %] 99 % (07/07 0527) Last BM Date: 04/01/17  Intake/Output from previous day: 07/06 0701 - 07/07 0700 In: 500 [I.V.:500] Out: 2351 [Urine:1950; Emesis/NG output:400; Stool:1] Intake/Output this shift: No intake/output data recorded.  General appearance: cooperative and no distress GI: Soft with mild distention still present. Occasional bowel sounds appreciated. No rigidity or tenderness noted.  Lab Results:   Recent Labs  03/31/17 0630 04/02/17 0605  WBC 11.9* 14.2*  HGB 11.8* 11.6*  HCT 37.2 38.0  PLT 231 170   BMET  Recent Labs  04/01/17 0548 04/02/17 0605  NA 140 145  K 3.8 3.3*  CL 100* 105  CO2 30 32  GLUCOSE 76 77  BUN 34* 25*  CREATININE 1.08* 0.81  CALCIUM 8.3* 8.4*   PT/INR No results for input(s): LABPROT, INR in the last 72 hours.  Studies/Results: Dg Abd 1 View  Result Date: 04/01/2017 CLINICAL DATA:  NG tube position. EXAM: ABDOMEN - 1 VIEW COMPARISON:  04/01/2017 FINDINGS: NG tube tip is in the mid stomach with the side port in the proximal stomach. IMPRESSION: NG tube tip in the mid stomach. Electronically Signed   By: Rolm Baptise M.D.   On: 04/01/2017 11:49   Dg Abd 2 Views  Result Date: 04/01/2017 CLINICAL DATA:  Followup small bowel obstruction. EXAM: ABDOMEN - 2 VIEW COMPARISON:  03/31/2017 FINDINGS: There are persistent loops of dilated central small bowel with air-fluid levels consistent with a partial small bowel obstruction, similar to the prior study. There is no free air. Nasogastric tube extends below the diaphragm into the mid stomach. IMPRESSION: 1. Persistent partial small bowel obstruction. No  significant change from the prior study. No free air. Electronically Signed   By: Lajean Manes M.D.   On: 04/01/2017 08:42   Dg Abd 2 Views  Result Date: 03/31/2017 CLINICAL DATA:  Follow-up small bowel obstruction. EXAM: ABDOMEN - 2 VIEW COMPARISON:  03/30/2017 abdominal CT FINDINGS: An NG tube is identified with tip overlying the proximal-mid stomach. Dilated small bowel loops are again noted and not significantly changed. There is no evidence of pneumoperitoneum. No other changes identified. IMPRESSION: Little significant change of dilated small bowel loops compatible with small bowel obstruction. No evidence of pneumoperitoneum. NG tube with tip overlying the proximal-mid stomach. Electronically Signed   By: Margarette Canada M.D.   On: 03/31/2017 21:12    Anti-infectives: Anti-infectives    None      Assessment/Plan: Impression: Small bowel obstruction. NG tube output has decreased. Still awaiting return of bowel function. We'll continue to monitor. Patient having episodes of SVT.  LOS: 2 days    Aviva Signs 04/02/2017

## 2017-04-03 ENCOUNTER — Inpatient Hospital Stay (HOSPITAL_COMMUNITY): Payer: Medicare Other

## 2017-04-03 DIAGNOSIS — I498 Other specified cardiac arrhythmias: Secondary | ICD-10-CM

## 2017-04-03 DIAGNOSIS — J690 Pneumonitis due to inhalation of food and vomit: Secondary | ICD-10-CM

## 2017-04-03 DIAGNOSIS — I5033 Acute on chronic diastolic (congestive) heart failure: Secondary | ICD-10-CM | POA: Diagnosis present

## 2017-04-03 DIAGNOSIS — I480 Paroxysmal atrial fibrillation: Secondary | ICD-10-CM | POA: Diagnosis present

## 2017-04-03 LAB — ECHOCARDIOGRAM COMPLETE
AVLVOTPG: 4 mmHg
CHL CUP RV SYS PRESS: 42 mmHg
CHL CUP STROKE VOLUME: 40 mL
E decel time: 215 msec
EERAT: 11.35
FS: 43 % (ref 28–44)
Height: 63 in
IV/PV OW: 1.29
LA vol index: 32.7 mL/m2
LA vol: 46.2 mL
LADIAMINDEX: 2.55 cm/m2
LASIZE: 36 mm
LAVOLA4C: 37.9 mL
LEFT ATRIUM END SYS DIAM: 36 mm
LV E/e' medial: 11.35
LV PW d: 8.53 mm — AB (ref 0.6–1.1)
LV SIMPSON'S DISK: 69
LV dias vol: 58 mL (ref 46–106)
LV e' LATERAL: 8.59 cm/s
LV sys vol index: 13 mL/m2
LVDIAVOLIN: 41 mL/m2
LVEEAVG: 11.35
LVOT SV: 73 mL
LVOT VTI: 23.1 cm
LVOT area: 3.14 cm2
LVOTD: 20 mm
LVOTPV: 106 cm/s
LVSYSVOL: 18 mL
MV Dec: 215
MV Peak grad: 4 mmHg
MV pk A vel: 61.4 m/s
MVPKEVEL: 97.5 m/s
P 1/2 time: 362 ms
RV LATERAL S' VELOCITY: 13.2 cm/s
RV TAPSE: 19.6 mm
Reg peak vel: 314 cm/s
TDI e' lateral: 8.59
TDI e' medial: 7.51
TR max vel: 314 cm/s
Weight: 1601.42 oz

## 2017-04-03 LAB — BASIC METABOLIC PANEL
ANION GAP: 14 (ref 5–15)
BUN: 26 mg/dL — ABNORMAL HIGH (ref 6–20)
CALCIUM: 8.5 mg/dL — AB (ref 8.9–10.3)
CO2: 29 mmol/L (ref 22–32)
Chloride: 101 mmol/L (ref 101–111)
Creatinine, Ser: 0.98 mg/dL (ref 0.44–1.00)
GFR, EST AFRICAN AMERICAN: 59 mL/min — AB (ref >60)
GFR, EST NON AFRICAN AMERICAN: 51 mL/min — AB (ref >60)
GLUCOSE: 122 mg/dL — AB (ref 65–99)
POTASSIUM: 3.5 mmol/L (ref 3.5–5.1)
Sodium: 144 mmol/L (ref 135–145)

## 2017-04-03 LAB — CBC
HEMATOCRIT: 39 % (ref 36.0–46.0)
Hemoglobin: 12.1 g/dL (ref 12.0–15.0)
MCH: 30 pg (ref 26.0–34.0)
MCHC: 31 g/dL (ref 30.0–36.0)
MCV: 96.8 fL (ref 78.0–100.0)
Platelets: 202 10*3/uL (ref 150–400)
RBC: 4.03 MIL/uL (ref 3.87–5.11)
RDW: 12.9 % (ref 11.5–15.5)
WBC: 15.6 10*3/uL — ABNORMAL HIGH (ref 4.0–10.5)

## 2017-04-03 LAB — PROCALCITONIN: Procalcitonin: <0.1 ng/mL

## 2017-04-03 LAB — MRSA PCR SCREENING: MRSA BY PCR: NEGATIVE

## 2017-04-03 MED ORDER — SODIUM CHLORIDE 0.9 % IV SOLN
3.0000 g | Freq: Two times a day (BID) | INTRAVENOUS | Status: DC
  Administered 2017-04-03 – 2017-04-10 (×14): 3 g via INTRAVENOUS
  Filled 2017-04-03 (×16): qty 3

## 2017-04-03 MED ORDER — IPRATROPIUM-ALBUTEROL 0.5-2.5 (3) MG/3ML IN SOLN
3.0000 mL | Freq: Three times a day (TID) | RESPIRATORY_TRACT | Status: DC
  Administered 2017-04-03 – 2017-04-12 (×27): 3 mL via RESPIRATORY_TRACT
  Filled 2017-04-03 (×28): qty 3

## 2017-04-03 MED ORDER — POTASSIUM CHLORIDE 10 MEQ/100ML IV SOLN
10.0000 meq | INTRAVENOUS | Status: AC
  Administered 2017-04-03 (×2): 10 meq via INTRAVENOUS
  Filled 2017-04-03 (×2): qty 100

## 2017-04-03 MED ORDER — LORAZEPAM 2 MG/ML IJ SOLN: 0.2500 mg | Freq: Three times a day (TID) | INTRAMUSCULAR | Status: DC | PRN

## 2017-04-03 MED ORDER — ENOXAPARIN SODIUM 30 MG/0.3ML ~~LOC~~ SOLN
30.0000 mg | SUBCUTANEOUS | Status: DC
  Administered 2017-04-04 – 2017-04-09 (×6): 30 mg via SUBCUTANEOUS
  Filled 2017-04-03 (×10): qty 0.3

## 2017-04-03 NOTE — Progress Notes (Signed)
*  PRELIMINARY RESULTS* Echocardiogram 2D Echocardiogram has been performed.  Samuel Germany 04/03/2017, 10:48 AM

## 2017-04-03 NOTE — Progress Notes (Signed)
Pharmacy Antibiotic Note  Andrea [name] is a 83 y.o. female admitted on 03/30/2017 with aspiration pneumonia.  Pharmacy has been consulted for Unasyn  dosing.  Plan: Unasyn 3 GM IV every 12 hours Monitor renal function Labs per protocol F/U cultures  Height: _0  (160 cm) Weight: 100 lb 1.4 oz (45.4 kg) IBW/kg (Calculated) : 52.4  Temp (24hrs), Avg:97.7 F (36.5 C), Min:97.5 F (36.4 C), Max:98.1 F (36.7 C)   Recent Labs Lab 03/30/17 1850 03/31/17 0630 04/01/17 0548 04/02/17 0605 04/03/17 0320  WBC 13.6* 11.9*  --  14.2* 15.6*  CREATININE 1.05* 1.04* 1.08* 0.81 0.98    Estimated Creatinine Clearance: 29.5 mL/min (by C-G formula based on SCr of 0.98 mg/dL).    Allergies  Allergen Reactions  . Vancomycin     Worsening kidney function  . Sulfa Antibiotics Palpitations    Unknown    Antimicrobials this admission: Unasyn 7/8 >>    >>   Dose adjustments this admission:     Thank you for allowing pharmacy to be a part of this patient's care.  Chriss Czar 04/03/2017 1:13 PM

## 2017-04-03 NOTE — Progress Notes (Signed)
  Subjective: Patient transferred to the unit due to an SVT. She has had 3 bowel movements over the past 24 hours. She denies any abdominal pain.  Objective: Vital signs in last 24 hours: Temp:  [97.5 F (36.4 C)-98.2 F (36.8 C)] 97.8 F (36.6 C) (07/08 1107) Pulse Rate:  [75-144] 75 (07/08 1107) Resp:  [18-29] 20 (07/08 1107) BP: (97-132)/(41-87) 125/55 (07/08 1000) SpO2:  [88 %-99 %] 89 % (07/08 1107) Weight:  [100 lb 1.4 oz (45.4 kg)] 100 lb 1.4 oz (45.4 kg) (07/08 0500) Last BM Date: 04/02/17  Intake/Output from previous day: 07/07 0701 - 07/08 0700 In: 831.4 [I.V.:431.4; IV Piggyback:400] Out: 1000 [Urine:400; Emesis/NG output:600] Intake/Output this shift: Total I/O In: 385.8 [P.O.:240; I.V.:145.8] Out: -   General appearance: cooperative and no distress GI: soft, non-tender; bowel sounds normal; no masses,  no organomegaly  Lab Results:   Recent Labs  04/02/17 0605 04/03/17 0320  WBC 14.2* 15.6*  HGB 11.6* 12.1  HCT 38.0 39.0  PLT 170 202   BMET  Recent Labs  04/02/17 0605 04/03/17 0320  NA 145 144  K 3.3* 3.5  CL 105 101  CO2 32 29  GLUCOSE 77 122*  BUN 25* 26*  CREATININE 0.81 0.98  CALCIUM 8.4* 8.5*   PT/INR No results for input(s): LABPROT, INR in the last 72 hours.  Studies/Results: Dg Abd 1 View  Result Date: 04/01/2017 CLINICAL DATA:  NG tube position. EXAM: ABDOMEN - 1 VIEW COMPARISON:  04/01/2017 FINDINGS: NG tube tip is in the mid stomach with the side port in the proximal stomach. IMPRESSION: NG tube tip in the mid stomach. Electronically Signed   By: Rolm Baptise M.D.   On: 04/01/2017 11:49   Dg Chest Port 1 View  Result Date: 04/02/2017 CLINICAL DATA:  Cough. EXAM: PORTABLE CHEST 1 VIEW COMPARISON:  03/30/2017 FINDINGS: 2 frontal radiographs. Nasogastric tube extends beyond the inferior aspect of the film. Patient rotated to the right. Midline trachea. Normal heart size. Layering bilateral pleural effusions are new or significantly  increased. Greater on the right. No pneumothorax. progression of moderate interstitial edema. Development of bibasilar airspace disease. IMPRESSION: Worsened aeration, with new or increased moderate congestive heart failure. Development of bilateral pleural effusions and adjacent airspace disease, most likely atelectasis. Infection or aspiration cannot be excluded. Electronically Signed   By: Abigail Miyamoto M.D.   On: 04/02/2017 11:14   Dg Abd 2 Views  Result Date: 04/03/2017 CLINICAL DATA:  Patient with history of small bowel obstruction. EXAM: ABDOMEN - 2 VIEW COMPARISON:  Abdominal radiograph 04/01/2017 FINDINGS: Enteric tube tip and side-port project over the stomach. Oral contrast material within the colon. Stable gaseous distended loops of small bowel within the central abdomen. Heterogeneous opacities lung bases bilaterally. Small bilateral pleural effusions. IMPRESSION: Enteric tube tip and side-port project over the stomach. Unchanged gaseous distended loops of bowel within the central abdomen. Layering effusions and underlying opacities. Electronically Signed   By: Lovey Newcomer M.D.   On: 04/03/2017 09:13    Anti-infectives: Anti-infectives    None      Assessment/Plan: Impression: Partial small bowel obstruction, resolving. KUB today shows oral contrast material within the colon. She has had multiple bowel movements. No need for acute surgical prevention. We will remove NG tube. Start sips of clears.  LOS: 3 days    Aviva Signs 04/03/2017

## 2017-04-03 NOTE — Progress Notes (Signed)
PROGRESS NOTE  Andrea [name] LXB:[id number] DOB: May 18, 1929 DOA: 03/30/2017 PCP: Unk Pinto, MD  Brief History: 83 year old female with a history of bronchiectasis/COPD, chronic respiratory failure on 2 L at night, hypertension, hyperlipidemia, diastolic CHF, hypothyroidism, and PATpresented with a one to two-day history of abdominal pain with associated nausea and vomiting. The patient denied any fevers, chills, chest pain, shortness breath, diarrhea, hematochezia, melena. CT of the abdomen and pelvis at the time of admission showed dilated small bowel loops up to 4.6 cm in the mid pelvis with a transition zone in the right pelvis. NG tube was inserted for decompression and Gen. surgery was consulted to assist with management.  Assessment/Plan: Small bowel obstruction -Likely due to adhesions -Continue NG tube decompression-->600cc output in past 24 hours -7/7-7/8--3 BMs -Appreciategeneral surgery-->continue nonsurgical management -7/8--started clears -7/8 AXR--unchanged gaseous distention of loops of small bowel  Atrial fibrillation/Atrial tachycardia -04/02/17 evening--developed Afib RVR with HR 150-160s -continue IV lopressor until able to take po reliably -continue IV diltiazem -spontaneously converted to sinus am 04/04/17 -EKG personally reviewed -repeat echo -TSH -04/22/2016 at EF 60-65%, grade 2 DD, no WMA, PASP 55  Aspiration pneumonia -due to vomiting from SBO -start Unasyn  COPD/bronchiectasis/chronic respiratory failure with hypoxia -On 2 L nasal cannula and nighttime at home -Presently stable on 3 L -start duonebs  Acute on Chronic diastolic CHF -Daily weights -04/02/17 CXR--bilateral pleural effusions, increase vascular congestion -continue lasix 40 mg IV daily -Saline lock IV fluids -Dry weight approximately 104 pounds -repeat echo  Essential hypertension -Holding bisoprolol/HCTZ until able to tolerate poreliably -Holding amlodipine and  losartan until able to reliably tolerate po -hydralazine IV prn SBP >180  Anxiety -IV ativan q 8 hrs prn until able to take po xanax  Hypothyroidism -Continue Synthroid IV    Disposition Plan: Home in 2-3 days; remain in SDU Family Communication: Daughtersupdatedat bedside 7/8--Total time spent 35 minutes.  Greater than 50% spent face to face counseling and coordinating care.   Consultants: General surgery  Code Status: FULL   DVT Prophylaxis: [place] Lovenox   Procedures: As Listed in Progress Note Above  Antibiotics: Unasyn 04/03/17   Subjective:   Objective: Vitals:   04/03/17 0811 04/03/17 0900 04/03/17 1000 04/03/17 1107  BP:  (!) 119/57 (!) 125/55   Pulse:    75  Resp: (!) 23 (!) 23 (!) 29 20  Temp: 97.7 F (36.5 C)   97.8 F (36.6 C)  TempSrc: Oral   Oral  SpO2:    (!) 89%  Weight:      Height:        Intake/Output Summary (Last 24 hours) at 04/03/17 1250 Last data filed at 04/03/17 1000  Gross per 24 hour  Intake          1217.17 ml  Output              200 ml  Net          1017.17 ml   Weight change:  Exam:   General:  Pt is alert, follows commands appropriately, not in acute distress  HEENT: No icterus, No thrush, No neck mass, [place]/AT  Cardiovascular: RRR, S1/S2, no rubs, no gallops  Respiratory: CTA bilaterally, no wheezing, no crackles, no rhonchi  Abdomen: Soft/+BS, non tender, non distended, no guarding  Extremities: No edema, No lymphangitis, No petechiae, No rashes, no synovitis   Data Reviewed: I have personally reviewed following labs and imaging studies Basic Metabolic  Panel:  Recent Labs Lab 03/30/17 1850 03/31/17 0630 04/01/17 0548 04/02/17 0605 04/03/17 0320  NA 134* 134* 140 145 144  K 4.0 3.8 3.8 3.3* 3.5  CL 91* 94* 100* 105 101  CO2 30 32 30 32 29  GLUCOSE 122* 102* 76 77 122*  BUN 31* 28* 34* 25* 26*  CREATININE 1.05* 1.04* 1.08* 0.81 0.98  CALCIUM 9.9 8.7* 8.3* 8.4* 8.5*  MG  --   --   1.8 1.7  --    Liver Function Tests:  Recent Labs Lab 03/30/17 1850 03/31/17 0630  AST 30 19  ALT 12* 9*  ALKPHOS 75 59  BILITOT 1.0 1.0  PROT 8.3* 6.4*  ALBUMIN 4.2 3.3*    Recent Labs Lab 03/30/17 1850  LIPASE 47   No results for input(s): AMMONIA in the last 168 hours. Coagulation Profile: No results for input(s): INR, PROTIME in the last 168 hours. CBC:  Recent Labs Lab 03/30/17 1850 03/31/17 0630 04/02/17 0605 04/03/17 0320  WBC 13.6* 11.9* 14.2* 15.6*  HGB 13.4 11.8* 11.6* 12.1  HCT 40.8 37.2 38.0 39.0  MCV 92.1 93.0 96.9 96.8  PLT 269 231 170 202   Cardiac Enzymes: No results for input(s): CKTOTAL, CKMB, CKMBINDEX, TROPONINI in the last 168 hours. BNP: Invalid input(s): POCBNP CBG: No results for input(s): GLUCAP in the last 168 hours. HbA1C: No results for input(s): HGBA1C in the last 72 hours. Urine analysis:    Component Value Date/Time   COLORURINE YELLOW 03/30/2017 2130   APPEARANCEUR CLEAR 03/30/2017 2130   LABSPEC 1.015 03/30/2017 2130   PHURINE 7.0 03/30/2017 2130   GLUCOSEU NEGATIVE 03/30/2017 2130   HGBUR NEGATIVE 03/30/2017 2130   [place] NEGATIVE 03/30/2017 2130   KETONESUR 5 (A) 03/30/2017 2130   PROTEINUR 30 (A) 03/30/2017 2130   UROBILINOGEN 0.2 08/20/2014 1613   NITRITE NEGATIVE 03/30/2017 2130   LEUKOCYTESUR NEGATIVE 03/30/2017 2130   Sepsis Labs: _0 (procalcitonin:4,lacticidven:4) ) Recent Results (from the past 240 hour(s))  MRSA PCR Screening     Status: None   Collection Time: 04/03/17 10:39 AM  Result Value Ref Range Status   MRSA by PCR NEGATIVE NEGATIVE Final    Comment:        The GeneXpert MRSA Assay (FDA approved for NASAL specimens only), is one component of a comprehensive MRSA colonization surveillance program. It is not intended to diagnose MRSA infection nor to guide or monitor treatment for MRSA infections.      Scheduled Meds: . diltiazem  10 mg Intravenous Once  . [START ON  04/04/2017] enoxaparin (LOVENOX) injection  30 mg Subcutaneous Q24H  . furosemide  40 mg Intravenous Daily  . levothyroxine  25 mcg Intravenous QAC breakfast  . metoprolol tartrate  2.5 mg Intravenous Q6H   Continuous Infusions: . diltiazem (CARDIZEM) infusion 15 mg/hr (04/03/17 1222)    Procedures/Studies: Dg Abd 1 View  Result Date: 04/01/2017 CLINICAL DATA:  NG tube position. EXAM: ABDOMEN - 1 VIEW COMPARISON:  04/01/2017 FINDINGS: NG tube tip is in the mid stomach with the side port in the proximal stomach. IMPRESSION: NG tube tip in the mid stomach. Electronically Signed   By: Rolm Baptise M.D.   On: 04/01/2017 11:49   Ct Abdomen Pelvis W Contrast  Result Date: 03/30/2017 CLINICAL DATA:  Abdominal pain with bloating.  Nausea and vomiting. EXAM: CT ABDOMEN AND PELVIS WITH CONTRAST TECHNIQUE: Multidetector CT imaging of the abdomen and pelvis was performed using the standard protocol following bolus administration of intravenous contrast. CONTRAST:  69m ISOVUE-300 IOPAMIDOL (ISOVUE-300) INJECTION 61%, 1053mISOVUE-300 IOPAMIDOL (ISOVUE-300) INJECTION 61% COMPARISON:  08/15/2007 FINDINGS: Lower chest: Tubular branching structures posterior lung bases suggest impacted airways but difficult to assess given the substantial breathing motion artifact. Hepatobiliary: 7 mm hypervascular lesion subcapsular left liver cannot be further characterized. Mild intrahepatic biliary duct prominence. Gallbladder unremarkable. Extrahepatic common bile duct measures 8 mm diameter, increased in the interval. Pancreas: No focal mass lesion. No dilatation of the main duct. No intraparenchymal cyst. No peripancreatic edema. Spleen: No splenomegaly. No focal mass lesion. Adrenals/Urinary Tract: No adrenal nodule or mass. Cortical scarring noted both kidneys with small renal cysts noted bilaterally. No evidence for hydroureter. Bladder is distended. Stomach/Bowel: Stomach is mildly distended. Duodenum unremarkable. Small  bowel loops in the lower abdomen and pelvis are fluid-filled and dilated. A prominently dilated loop in the mid pelvis measures up to 4.6 cm diameter. This dilated loop pin tracts down to the pelvis where a discrete transition point is identified in the right pelvis (see image 53 series 2 and also visible on sagittal image 52 of series 6). I cannot identify dilated small bowel beyond this transition zone to suggest a closed loop etiology. The terminal ileum is collapsed. The appendix is not visualized, but there is no edema or inflammation in the region of the cecum. Diverticuli are seen scattered along the entire length of the colon without CT findings of diverticulitis. Vascular/Lymphatic: There is abdominal aortic atherosclerosis without aneurysm. There is no gastrohepatic or hepatoduodenal ligament lymphadenopathy. No intraperitoneal or retroperitoneal lymphadenopathy. No pelvic sidewall lymphadenopathy. Reproductive: Uterus is surgically absent. There is no adnexal mass. Other: Free fluid is identified around the liver and spleen. Interloop mesenteric fluid is identified in the central small bowel mesentery. Musculoskeletal: Bone windows reveal no worrisome lytic or sclerotic osseous lesions. IMPRESSION: 1. Mechanical small bowel obstruction with small bowel loops dilated up to 4.6 cm diameter and a definite transition zone identified in the right pelvis. Although there is some question of twisting in the region of the transition zone, there is no second transition zone identified nor dilated small bowel after the transition zone to suggest a closed loop obstruction. As such, imaging features likely related to an adhesion given the history of hysterectomy. There is fluid around the liver and spleen with interloop mesenteric fluid associated with the abnormal small bowel loops. No small bowel wall thickening or pneumatosis at this time to suggest overt bowel ischemia. 2. Probable airway impaction in both  posterior lower lobes although not well assessed given motion artifact. Electronically Signed   By: ErMisty Stanley.D.   On: 03/30/2017 21:51   Dg Chest Port 1 View  Result Date: 04/02/2017 CLINICAL DATA:  Cough. EXAM: PORTABLE CHEST 1 VIEW COMPARISON:  03/30/2017 FINDINGS: 2 frontal radiographs. Nasogastric tube extends beyond the inferior aspect of the film. Patient rotated to the right. Midline trachea. Normal heart size. Layering bilateral pleural effusions are new or significantly increased. Greater on the right. No pneumothorax. progression of moderate interstitial edema. Development of bibasilar airspace disease. IMPRESSION: Worsened aeration, with new or increased moderate congestive heart failure. Development of bilateral pleural effusions and adjacent airspace disease, most likely atelectasis. Infection or aspiration cannot be excluded. Electronically Signed   By: KyAbigail Miyamoto.D.   On: 04/02/2017 11:14   Dg Chest Portable 1 View  Result Date: 03/30/2017 CLINICAL DATA:  Nasogastric tube placement. Generalized abdominal pain and bloating, acute onset. Initial encounter. EXAM: PORTABLE CHEST 1 VIEW COMPARISON:  Chest radiograph performed  06/10/2016 FINDINGS: The patient's enteric tube is seen extending below the diaphragm. The lungs are hyperexpanded, with biapical scarring. Vascular congestion is noted, with increased interstitial markings. Mild interstitial edema cannot be excluded. No pleural effusion or pneumothorax is seen. The cardiomediastinal silhouette is borderline normal in size. No acute osseous abnormalities are seen. Mild degenerative change is noted at the glenohumeral joints bilaterally. IMPRESSION: 1. Enteric tube noted extending below the diaphragm. 2. Lungs hyperexpanded, with biapical scarring, likely reflecting COPD. Underlying vascular congestion, with increased interstitial markings. Mild interstitial edema cannot be excluded. Electronically Signed   By: Garald Balding M.D.    On: 03/30/2017 23:55   Dg Abd 2 Views  Result Date: 04/03/2017 CLINICAL DATA:  Patient with history of small bowel obstruction. EXAM: ABDOMEN - 2 VIEW COMPARISON:  Abdominal radiograph 04/01/2017 FINDINGS: Enteric tube tip and side-port project over the stomach. Oral contrast material within the colon. Stable gaseous distended loops of small bowel within the central abdomen. Heterogeneous opacities lung bases bilaterally. Small bilateral pleural effusions. IMPRESSION: Enteric tube tip and side-port project over the stomach. Unchanged gaseous distended loops of bowel within the central abdomen. Layering effusions and underlying opacities. Electronically Signed   By: Lovey Newcomer M.D.   On: 04/03/2017 09:13   Dg Abd 2 Views  Result Date: 04/01/2017 CLINICAL DATA:  Followup small bowel obstruction. EXAM: ABDOMEN - 2 VIEW COMPARISON:  03/31/2017 FINDINGS: There are persistent loops of dilated central small bowel with air-fluid levels consistent with a partial small bowel obstruction, similar to the prior study. There is no free air. Nasogastric tube extends below the diaphragm into the mid stomach. IMPRESSION: 1. Persistent partial small bowel obstruction. No significant change from the prior study. No free air. Electronically Signed   By: Lajean Manes M.D.   On: 04/01/2017 08:42   Dg Abd 2 Views  Result Date: 03/31/2017 CLINICAL DATA:  Follow-up small bowel obstruction. EXAM: ABDOMEN - 2 VIEW COMPARISON:  03/30/2017 abdominal CT FINDINGS: An NG tube is identified with tip overlying the proximal-mid stomach. Dilated small bowel loops are again noted and not significantly changed. There is no evidence of pneumoperitoneum. No other changes identified. IMPRESSION: Little significant change of dilated small bowel loops compatible with small bowel obstruction. No evidence of pneumoperitoneum. NG tube with tip overlying the proximal-mid stomach. Electronically Signed   By: Margarette Canada M.D.   On: 03/31/2017 21:12     Gara Kincade, DO  [facility] Pager [phone]  If 7PM-7AM, please contact night-coverage [email or web address] Password [email or web address] 04/03/2017, 12:50 PM   LOS: 3 days

## 2017-04-04 ENCOUNTER — Inpatient Hospital Stay (HOSPITAL_COMMUNITY): Payer: Medicare Other

## 2017-04-04 LAB — CBC
HCT: 38.2 % (ref 36.0–46.0)
HEMOGLOBIN: 11.8 g/dL — AB (ref 12.0–15.0)
MCH: 29.9 pg (ref 26.0–34.0)
MCHC: 30.9 g/dL (ref 30.0–36.0)
MCV: 97 fL (ref 78.0–100.0)
Platelets: 192 10*3/uL (ref 150–400)
RBC: 3.94 MIL/uL (ref 3.87–5.11)
RDW: 12.8 % (ref 11.5–15.5)
WBC: 16.4 10*3/uL — ABNORMAL HIGH (ref 4.0–10.5)

## 2017-04-04 LAB — BASIC METABOLIC PANEL
ANION GAP: 9 (ref 5–15)
BUN: 24 mg/dL — ABNORMAL HIGH (ref 6–20)
CHLORIDE: 98 mmol/L — AB (ref 101–111)
CO2: 40 mmol/L — AB (ref 22–32)
Calcium: 8.4 mg/dL — ABNORMAL LOW (ref 8.9–10.3)
Creatinine, Ser: 0.93 mg/dL (ref 0.44–1.00)
GFR calc non Af Amer: 54 mL/min — ABNORMAL LOW (ref >60)
Glucose, Bld: 151 mg/dL — ABNORMAL HIGH (ref 65–99)
Potassium: 3 mmol/L — ABNORMAL LOW (ref 3.5–5.1)
Sodium: 147 mmol/L — ABNORMAL HIGH (ref 135–145)

## 2017-04-04 LAB — PROCALCITONIN: Procalcitonin: 0.22 ng/mL

## 2017-04-04 LAB — MAGNESIUM: Magnesium: 1.4 mg/dL — ABNORMAL LOW (ref 1.7–2.4)

## 2017-04-04 LAB — GLUCOSE, CAPILLARY: Glucose-Capillary: 128 mg/dL — ABNORMAL HIGH (ref 65–99)

## 2017-04-04 LAB — TSH: TSH: 0.936 u[IU]/mL (ref 0.350–4.500)

## 2017-04-04 MED ORDER — PANTOPRAZOLE SODIUM 40 MG PO TBEC
40.0000 mg | DELAYED_RELEASE_TABLET | Freq: Every day | ORAL | Status: DC
  Administered 2017-04-04 – 2017-04-05 (×2): 40 mg via ORAL
  Filled 2017-04-04 (×2): qty 1

## 2017-04-04 MED ORDER — POTASSIUM CHLORIDE CRYS ER 20 MEQ PO TBCR
40.0000 meq | EXTENDED_RELEASE_TABLET | Freq: Once | ORAL | Status: AC
  Administered 2017-04-04: 40 meq via ORAL
  Filled 2017-04-04: qty 2

## 2017-04-04 MED ORDER — MAGNESIUM SULFATE 2 GM/50ML IV SOLN
2.0000 g | Freq: Once | INTRAVENOUS | Status: AC
Start: 2017-04-04 — End: 2017-04-04
  Administered 2017-04-04: 2 g via INTRAVENOUS
  Filled 2017-04-04: qty 50

## 2017-04-04 MED ORDER — POTASSIUM CHLORIDE 10 MEQ/100ML IV SOLN
10.0000 meq | INTRAVENOUS | Status: AC
  Administered 2017-04-04 (×2): 10 meq via INTRAVENOUS
  Filled 2017-04-04 (×2): qty 100

## 2017-04-04 MED ORDER — BISACODYL 10 MG RE SUPP
10.0000 mg | Freq: Every morning | RECTAL | Status: DC
  Administered 2017-04-04 – 2017-04-12 (×7): 10 mg via RECTAL
  Filled 2017-04-04 (×9): qty 1

## 2017-04-04 MED ORDER — ORAL CARE MOUTH RINSE
15.0000 mL | Freq: Two times a day (BID) | OROMUCOSAL | Status: DC
  Administered 2017-04-04 – 2017-04-12 (×14): 15 mL via OROMUCOSAL

## 2017-04-04 MED ORDER — SENNOSIDES-DOCUSATE SODIUM 8.6-50 MG PO TABS
1.0000 | ORAL_TABLET | Freq: Every day | ORAL | Status: DC
  Administered 2017-04-04 – 2017-04-11 (×7): 1 via ORAL
  Filled 2017-04-04 (×7): qty 1

## 2017-04-04 NOTE — Progress Notes (Addendum)
PROGRESS NOTE  Andrea [name]:[id number] DOB: 04-Dec-1928 DOA: 03/30/2017 PCP: Unk Pinto, MD  Brief History: 83 year old female with a history of bronchiectasis/COPD, chronic respiratory failure on 2 L at night, hypertension, hyperlipidemia, diastolic CHF, hypothyroidism, and PATpresented with a one to two-day history of abdominal pain with associated nausea and vomiting. The patient denied any fevers, chills, chest pain, shortness breath, diarrhea, hematochezia, melena. CT of the abdomen and pelvis at the time of admission showed dilated small bowel loops up to 4.6 cm in the mid pelvis with a transition zone in the right pelvis. NG tube was inserted for decompression and Gen. surgery was consulted to assist with management. Overall, her bowel function has improved, and her NG tube was discontinued. Her diet was advanced which she tolerated. Her hospitalization has been Located by atrial tachycardia/atrial fibrillation, acute on chronic diastolic CHF, and aspiration pneumonia.  Assessment/Plan: Small bowel obstruction -Likely due to adhesions -Continue NG tube decompression-->600ccoutput in past 24 hours -7/7-7/9--4 BMs -Appreciategeneral surgery-->continue nonsurgical management -7/9--started full liquids per surgery -7/8 AXR--unchanged gaseous distention of loops of small bowel  Atrial fibrillation/Atrial tachycardia -04/02/17 evening--developed Afib RVR with HR 150-160s -continue IV lopressor until able to take po reliably -cardizem drip turned off early am 7/9-->went back to atrial tach -restart diltiazem drip--will need conversion to po diltiazem prior to d/c drip -spontaneously converted to sinus am 04/04/17 -EKG personally reviewed -TSH--0.936 -04/22/2016 echo EF 60-65%, grade 2 DD, no WMA, PASP 55 -04/03/17 echo--EF 60-65%, no WMA, PASP 42  Aspiration pneumonia -due to vomiting from SBO -initially NG was inserted into trachea -start  Unasyn  Leukocytosis --WBC gradually climbing -UA and urine culture -recheck CXR -CT chest/abd/pelvis if worsens  COPD/bronchiectasis/chronic respiratory failure with hypoxia -On 2 L nasal cannula and nighttime at home -Presently stable on 3 L -start duonebs  Acute on Chronic diastolic CHF -Daily weights--NEG 5 lbs -04/02/17 CXR--bilateral pleural effusions, increase vascular congestion -continue lasix 40 mg IV daily -Saline lock IV fluids -Dry weight approximately 104 pounds -04/03/17 echo--EF 60-65%, no WMA, PASP 42  Hypokalemia/Hypomagnesemia -replete -recheck mag  Essential hypertension -Holding bisoprolol/HCTZ until able to tolerate poreliably -Holding amlodipine and losartan until able to reliably tolerate po -hydralazine IV prn SBP >180  Anxiety -IV ativan q 8 hrs prn until able to take po xanax  Hypothyroidism -Continue Synthroid IV    Disposition Plan: Home in 2-3 days; remain in SDU Family Communication: Daughtersupdatedat bedside 7/9--Total time spent 35 minutes. Greater than 50% spent face to face counseling and coordinating care.   Consultants: General surgery  Code Status: FULL   DVT Prophylaxis: [place] Lovenox   Procedures: As Listed in Progress Note Above  Antibiotics: Unasyn 04/03/17     Subjective: Patient denies fevers, chills, headache, chest pain, dyspnea, nausea, vomiting, diarrhea, abdominal pain, dysuria, hematuria, hematochezia, and melena.   Objective: Vitals:   04/04/17 0900 04/04/17 1000 04/04/17 1100 04/04/17 1200  BP: (!) 122/59 (!) 127/51 (!) 121/55 (!) 131/59  Pulse: 85 79 85 94  Resp: (!) _0 Temp:    98.3 F (36.8 C)  TempSrc:    Oral  SpO2: 98% 98% 97% 91%  Weight:      Height:        Intake/Output Summary (Last 24 hours) at 04/04/17 1318 Last data filed at 04/04/17 1000  Gross per 24 hour  Intake             1230 ml  Output              800 ml  Net              430 ml    Weight change: -0.7 kg (-1 lb 8.7 oz) Exam:   General:  Pt is alert, follows commands appropriately, not in acute distress  HEENT: No icterus, No thrush, No neck mass, [place]/AT  Cardiovascular: RRR, S1/S2, no rubs, no gallops  Respiratory: Bibasilar crackles, right greater than left. No wheezing.  Abdomen: Soft/+BS, non tender, non distended, no guarding  Extremities: trace LE edema, No lymphangitis, No petechiae, No rashes, no synovitis   Data Reviewed: I have personally reviewed following labs and imaging studies Basic Metabolic Panel:  Recent Labs Lab 03/31/17 0630 04/01/17 0548 04/02/17 0605 04/03/17 0320 04/04/17 0502  NA 134* 140 145 144 147*  K 3.8 3.8 3.3* 3.5 3.0*  CL 94* 100* 105 101 98*  CO2 32 30 32 29 40*  GLUCOSE 102* 76 77 122* 151*  BUN 28* 34* 25* 26* 24*  CREATININE 1.04* 1.08* 0.81 0.98 0.93  CALCIUM 8.7* 8.3* 8.4* 8.5* 8.4*  MG  --  1.8 1.7  --  1.4*   Liver Function Tests:  Recent Labs Lab 03/30/17 1850 03/31/17 0630  AST 30 19  ALT 12* 9*  ALKPHOS 75 59  BILITOT 1.0 1.0  PROT 8.3* 6.4*  ALBUMIN 4.2 3.3*    Recent Labs Lab 03/30/17 1850  LIPASE 47   No results for input(s): AMMONIA in the last 168 hours. Coagulation Profile: No results for input(s): INR, PROTIME in the last 168 hours. CBC:  Recent Labs Lab 03/30/17 1850 03/31/17 0630 04/02/17 0605 04/03/17 0320 04/04/17 0502  WBC 13.6* 11.9* 14.2* 15.6* 16.4*  HGB 13.4 11.8* 11.6* 12.1 11.8*  HCT 40.8 37.2 38.0 39.0 38.2  MCV 92.1 93.0 96.9 96.8 97.0  PLT 269 231 170 202 192   Cardiac Enzymes: No results for input(s): CKTOTAL, CKMB, CKMBINDEX, TROPONINI in the last 168 hours. BNP: Invalid input(s): POCBNP CBG:  Recent Labs Lab 04/04/17 0815  GLUCAP 128*   HbA1C: No results for input(s): HGBA1C in the last 72 hours. Urine analysis:    Component Value Date/Time   COLORURINE YELLOW 03/30/2017 2130   APPEARANCEUR CLEAR 03/30/2017 2130   LABSPEC 1.015  03/30/2017 2130   PHURINE 7.0 03/30/2017 2130   GLUCOSEU NEGATIVE 03/30/2017 2130   HGBUR NEGATIVE 03/30/2017 2130   [place] NEGATIVE 03/30/2017 2130   KETONESUR 5 (A) 03/30/2017 2130   PROTEINUR 30 (A) 03/30/2017 2130   UROBILINOGEN 0.2 08/20/2014 1613   NITRITE NEGATIVE 03/30/2017 2130   LEUKOCYTESUR NEGATIVE 03/30/2017 2130   Sepsis Labs: _0 (procalcitonin:4,lacticidven:4) ) Recent Results (from the past 240 hour(s))  MRSA PCR Screening     Status: None   Collection Time: 04/03/17 10:39 AM  Result Value Ref Range Status   MRSA by PCR NEGATIVE NEGATIVE Final    Comment:        The GeneXpert MRSA Assay (FDA approved for NASAL specimens only), is one component of a comprehensive MRSA colonization surveillance program. It is not intended to diagnose MRSA infection nor to guide or monitor treatment for MRSA infections.      Scheduled Meds: . bisacodyl  10 mg Rectal q morning - 10a  . diltiazem  10 mg Intravenous Once  . enoxaparin (LOVENOX) injection  30 mg Subcutaneous Q24H  . furosemide  40 mg Intravenous Daily  . ipratropium-albuterol  3 mL Nebulization Q8H  .  levothyroxine  25 mcg Intravenous QAC breakfast  . mouth rinse  15 mL Mouth Rinse BID  . metoprolol tartrate  2.5 mg Intravenous Q6H  . pantoprazole  40 mg Oral Daily  . senna-docusate  1 tablet Oral QHS   Continuous Infusions: . ampicillin-sulbactam (UNASYN) IV Stopped (04/04/17 0419)  . diltiazem (CARDIZEM) infusion Stopped (04/04/17 0300)    Procedures/Studies: Dg Abd 1 View  Result Date: 04/01/2017 CLINICAL DATA:  NG tube position. EXAM: ABDOMEN - 1 VIEW COMPARISON:  04/01/2017 FINDINGS: NG tube tip is in the mid stomach with the side port in the proximal stomach. IMPRESSION: NG tube tip in the mid stomach. Electronically Signed   By: Rolm Baptise M.D.   On: 04/01/2017 11:49   Ct Abdomen Pelvis W Contrast  Result Date: 03/30/2017 CLINICAL DATA:  Abdominal pain with bloating.  Nausea and  vomiting. EXAM: CT ABDOMEN AND PELVIS WITH CONTRAST TECHNIQUE: Multidetector CT imaging of the abdomen and pelvis was performed using the standard protocol following bolus administration of intravenous contrast. CONTRAST:  42m ISOVUE-300 IOPAMIDOL (ISOVUE-300) INJECTION 61%, 1044mISOVUE-300 IOPAMIDOL (ISOVUE-300) INJECTION 61% COMPARISON:  08/15/2007 FINDINGS: Lower chest: Tubular branching structures posterior lung bases suggest impacted airways but difficult to assess given the substantial breathing motion artifact. Hepatobiliary: 7 mm hypervascular lesion subcapsular left liver cannot be further characterized. Mild intrahepatic biliary duct prominence. Gallbladder unremarkable. Extrahepatic common bile duct measures 8 mm diameter, increased in the interval. Pancreas: No focal mass lesion. No dilatation of the main duct. No intraparenchymal cyst. No peripancreatic edema. Spleen: No splenomegaly. No focal mass lesion. Adrenals/Urinary Tract: No adrenal nodule or mass. Cortical scarring noted both kidneys with small renal cysts noted bilaterally. No evidence for hydroureter. Bladder is distended. Stomach/Bowel: Stomach is mildly distended. Duodenum unremarkable. Small bowel loops in the lower abdomen and pelvis are fluid-filled and dilated. A prominently dilated loop in the mid pelvis measures up to 4.6 cm diameter. This dilated loop pin tracts down to the pelvis where a discrete transition point is identified in the right pelvis (see image 53 series 2 and also visible on sagittal image 52 of series 6). I cannot identify dilated small bowel beyond this transition zone to suggest a closed loop etiology. The terminal ileum is collapsed. The appendix is not visualized, but there is no edema or inflammation in the region of the cecum. Diverticuli are seen scattered along the entire length of the colon without CT findings of diverticulitis. Vascular/Lymphatic: There is abdominal aortic atherosclerosis without aneurysm.  There is no gastrohepatic or hepatoduodenal ligament lymphadenopathy. No intraperitoneal or retroperitoneal lymphadenopathy. No pelvic sidewall lymphadenopathy. Reproductive: Uterus is surgically absent. There is no adnexal mass. Other: Free fluid is identified around the liver and spleen. Interloop mesenteric fluid is identified in the central small bowel mesentery. Musculoskeletal: Bone windows reveal no worrisome lytic or sclerotic osseous lesions. IMPRESSION: 1. Mechanical small bowel obstruction with small bowel loops dilated up to 4.6 cm diameter and a definite transition zone identified in the right pelvis. Although there is some question of twisting in the region of the transition zone, there is no second transition zone identified nor dilated small bowel after the transition zone to suggest a closed loop obstruction. As such, imaging features likely related to an adhesion given the history of hysterectomy. There is fluid around the liver and spleen with interloop mesenteric fluid associated with the abnormal small bowel loops. No small bowel wall thickening or pneumatosis at this time to suggest overt bowel ischemia. 2. Probable airway  impaction in both posterior lower lobes although not well assessed given motion artifact. Electronically Signed   By: Misty Stanley M.D.   On: 03/30/2017 21:51   Dg Chest Port 1 View  Result Date: 04/02/2017 CLINICAL DATA:  Cough. EXAM: PORTABLE CHEST 1 VIEW COMPARISON:  03/30/2017 FINDINGS: 2 frontal radiographs. Nasogastric tube extends beyond the inferior aspect of the film. Patient rotated to the right. Midline trachea. Normal heart size. Layering bilateral pleural effusions are new or significantly increased. Greater on the right. No pneumothorax. progression of moderate interstitial edema. Development of bibasilar airspace disease. IMPRESSION: Worsened aeration, with new or increased moderate congestive heart failure. Development of bilateral pleural effusions and  adjacent airspace disease, most likely atelectasis. Infection or aspiration cannot be excluded. Electronically Signed   By: Abigail Miyamoto M.D.   On: 04/02/2017 11:14   Dg Chest Portable 1 View  Result Date: 03/30/2017 CLINICAL DATA:  Nasogastric tube placement. Generalized abdominal pain and bloating, acute onset. Initial encounter. EXAM: PORTABLE CHEST 1 VIEW COMPARISON:  Chest radiograph performed 06/10/2016 FINDINGS: The patient's enteric tube is seen extending below the diaphragm. The lungs are hyperexpanded, with biapical scarring. Vascular congestion is noted, with increased interstitial markings. Mild interstitial edema cannot be excluded. No pleural effusion or pneumothorax is seen. The cardiomediastinal silhouette is borderline normal in size. No acute osseous abnormalities are seen. Mild degenerative change is noted at the glenohumeral joints bilaterally. IMPRESSION: 1. Enteric tube noted extending below the diaphragm. 2. Lungs hyperexpanded, with biapical scarring, likely reflecting COPD. Underlying vascular congestion, with increased interstitial markings. Mild interstitial edema cannot be excluded. Electronically Signed   By: Garald Balding M.D.   On: 03/30/2017 23:55   Dg Abd 2 Views  Result Date: 04/03/2017 CLINICAL DATA:  Patient with history of small bowel obstruction. EXAM: ABDOMEN - 2 VIEW COMPARISON:  Abdominal radiograph 04/01/2017 FINDINGS: Enteric tube tip and side-port project over the stomach. Oral contrast material within the colon. Stable gaseous distended loops of small bowel within the central abdomen. Heterogeneous opacities lung bases bilaterally. Small bilateral pleural effusions. IMPRESSION: Enteric tube tip and side-port project over the stomach. Unchanged gaseous distended loops of bowel within the central abdomen. Layering effusions and underlying opacities. Electronically Signed   By: Lovey Newcomer M.D.   On: 04/03/2017 09:13   Dg Abd 2 Views  Result Date:  04/01/2017 CLINICAL DATA:  Followup small bowel obstruction. EXAM: ABDOMEN - 2 VIEW COMPARISON:  03/31/2017 FINDINGS: There are persistent loops of dilated central small bowel with air-fluid levels consistent with a partial small bowel obstruction, similar to the prior study. There is no free air. Nasogastric tube extends below the diaphragm into the mid stomach. IMPRESSION: 1. Persistent partial small bowel obstruction. No significant change from the prior study. No free air. Electronically Signed   By: Lajean Manes M.D.   On: 04/01/2017 08:42   Dg Abd 2 Views  Result Date: 03/31/2017 CLINICAL DATA:  Follow-up small bowel obstruction. EXAM: ABDOMEN - 2 VIEW COMPARISON:  03/30/2017 abdominal CT FINDINGS: An NG tube is identified with tip overlying the proximal-mid stomach. Dilated small bowel loops are again noted and not significantly changed. There is no evidence of pneumoperitoneum. No other changes identified. IMPRESSION: Little significant change of dilated small bowel loops compatible with small bowel obstruction. No evidence of pneumoperitoneum. NG tube with tip overlying the proximal-mid stomach. Electronically Signed   By: Margarette Canada M.D.   On: 03/31/2017 21:12    Sharen Youngren, DO  [facility] Pager  [phone]  If 7PM-7AM, please contact night-coverage [email or web address] Password [email or web address] 04/04/2017, 1:18 PM   LOS: 4 days

## 2017-04-04 NOTE — Evaluation (Signed)
Clinical/Bedside Swallow Evaluation Patient Details  Name: Andrea [name] MRN: [id number] Date of Birth: 1928/10/18  Today's Date: 04/04/2017 Time: SLP Start Time (ACUTE ONLY): 1201 SLP Stop Time (ACUTE ONLY): 0355 SLP Time Calculation (min) (ACUTE ONLY): 43 min  Past Medical History:  Past Medical History:  Diagnosis Date  . Aortic regurgitation   . Bronchiectasis ([place])   . COPD (chronic obstructive pulmonary disease) ([place])   . DJD (degenerative joint disease)   . HLD (hyperlipidemia)   . HTN (hypertension)   . Mild mitral regurgitation by prior echocardiogram   . Mild tricuspid regurgitation   . Palpitations    a. has known history of PAC's and PVC's. b. 09/2015: monitor showed sinus rhythm with occasional PAC's and a brief episode of PAT.  [name] Thyroid disease    Past Surgical History:  Past Surgical History:  Procedure Laterality Date  . ABDOMINAL HYSTERECTOMY  1993  . CATARACT EXTRACTION, BILATERAL     HPI:  83 year old female with a history of bronchiectasis/COPD, chronic respiratory failure on 2 L at night, hypertension, hyperlipidemia, diastolic CHF, hypothyroidism, and PATpresented with a one to two-day history of abdominal pain with associated nausea and vomiting. The patient denied any fevers, chills, chest pain, shortness breath, diarrhea, hematochezia, melena. CT of the abdomen and pelvis at the time of admission showed dilated small bowel loops up to 4.6 cm in the mid pelvis with a transition zone in the right pelvis. NG tube was inserted for decompression and Gen. surgery was consulted to assist with management. Overall, her bowel function has improved, and her NG tube was discontinued 7/8. Her diet was advanced which she tolerated. Her hospitalization has been Located by atrial tachycardia/atrial fibrillation, acute on chronic diastolic CHF, and aspiration pneumonia. Pt and family report continued swallowing difficulty. ST will evaluate and treat as indicated.   Assessment  / Plan / Recommendation Clinical Impression  Pt was evaluated at bedside and was provided oral care after being positioned appropriately for swallowing. NG tube discontinued 7/8; oral cavity was dry and note dried dark brown places outside nose and on soft palate (unable to be cleared with toothette). Pt consumed ice chips without incident and the initial sips of water revealed no overt s/sx of aspiration although SLP noted consistent belching after each sip; on the 3rd cup sip she demonstrated an overt coughing and choking episode and a noted weak cough with difficulty clearing wetness. Pt continued to demonstrate wet vocal quality and weak coughing throughout the remainder of the evaluation. She demonstrated immediate coughing with NTL (difficult to differentiate residual wetness from thin trials and wetness from NTL). She consumed HTL with no overt s/sx of aspiration. She consumed pudding and jell-o textures with no overt s/sx of aspiration. Note poor coordination of belching, respiration and swallowing compounded by oral and possibly pharyngeal weakness. Recommend conservative diet of D1/puree' textures and Honey thick liquids pending MBS 7/10. Meds to be crushed in puree'. Noted possible s/sx of GERD which were reported to nursing; pt has not been treated for GERD since admission but is on daily treatment for "acid reflux" per Pt and family at home. Nursing will f/u regarding this concern. Family was extensively educated regarding diet and upcoming MBS 7/10. SLP Visit Diagnosis: Dysphagia, oropharyngeal phase (R13.12)    Aspiration Risk  Moderate aspiration risk    Diet Recommendation Dysphagia 1 (Puree);Honey-thick liquid   Liquid Administration via: Cup Medication Administration: Crushed with puree Supervision: Staff to assist with self feeding;Full supervision/cueing for compensatory strategies  Compensations: Minimize environmental distractions;Slow rate;Small sips/bites;Multiple dry swallows  after each bite/sip;Follow solids with liquid;Clear throat intermittently Postural Changes: Seated upright at 90 degrees;Remain upright for at least 30 minutes after po intake    Other  Recommendations Oral Care Recommendations: Oral care BID Other Recommendations: Order thickener from pharmacy;Remove water pitcher   Follow up Recommendations        Frequency and Duration min 2x/week  2 weeks       Prognosis Prognosis for Safe Diet Advancement: Fair      Swallow Study   General Date of Onset: 03/30/17 HPI: 83 year old female with a history of bronchiectasis/COPD, chronic respiratory failure on 2 L at night, hypertension, hyperlipidemia, diastolic CHF, hypothyroidism, and PATpresented with a one to two-day history of abdominal pain with associated nausea and vomiting. The patient denied any fevers, chills, chest pain, shortness breath, diarrhea, hematochezia, melena. CT of the abdomen and pelvis at the time of admission showed dilated small bowel loops up to 4.6 cm in the mid pelvis with a transition zone in the right pelvis. NG tube was inserted for decompression and Gen. surgery was consulted to assist with management. Overall, her bowel function has improved, and her NG tube was discontinued 7/8. Her diet was advanced which she tolerated. Her hospitalization has been Located by atrial tachycardia/atrial fibrillation, acute on chronic diastolic CHF, and aspiration pneumonia. Pt and family report continued swallowing difficulty. ST will evaluate and treat as indicated. Type of Study: Bedside Swallow Evaluation Previous Swallow Assessment: none Diet Prior to this Study: NPO Temperature Spikes Noted: No Respiratory Status: Nasal cannula History of Recent Intubation: No Behavior/Cognition: Alert;Cooperative;Pleasant mood;Requires cueing Oral Cavity Assessment: Dry;Dried secretions Oral Care Completed by SLP: Yes Oral Cavity - Dentition: Adequate natural dentition Vision: Functional for  self-feeding Self-Feeding Abilities: Able to feed self Patient Positioning: Upright in bed Baseline Vocal Quality: Normal Volitional Cough: Weak Volitional Swallow: Able to elicit    Oral/Motor/Sensory Function Overall Oral Motor/Sensory Function: Mild impairment   Ice Chips Ice chips: Within functional limits   Thin Liquid Thin Liquid: Impaired Presentation: Cup Oral Phase Impairments: Poor awareness of bolus Pharyngeal  Phase Impairments: Suspected delayed Swallow;Wet Vocal Quality;Cough - Immediate;Cough - Delayed    Nectar Thick Nectar Thick Liquid: Impaired Presentation: Cup Oral Phase Impairments: Reduced lingual movement/coordination;Poor awareness of bolus Pharyngeal Phase Impairments: Suspected delayed Swallow;Multiple swallows;Wet Vocal Quality;Cough - Immediate   Honey Thick Honey Thick Liquid: Within functional limits   Puree Puree: Impaired Presentation: Spoon Oral Phase Impairments: Reduced lingual movement/coordination Oral Phase Functional Implications: Prolonged oral transit;Oral residue   Solid   Mathilde Mcwherter H. Roddie Mc, CCC-SLP Speech Language Pathologist             Wende Bushy 04/04/2017,6:09 PM

## 2017-04-04 NOTE — Progress Notes (Signed)
  Subjective: Patient resting can't fully. No abdominal pain. Nursing staff noticing patient not swallowing well.  Objective: Vital signs in last 24 hours: Temp:  [97.5 F (36.4 C)-98 F (36.7 C)] 97.5 F (36.4 C) (07/09 0400) Pulse Rate:  [66-109] 71 (07/09 0700) Resp:  [13-35] 14 (07/09 0700) BP: (106-141)/(41-71) 116/54 (07/09 0700) SpO2:  [89 %-98 %] 97 % (07/09 0735) Weight:  [98 lb 8.7 oz (44.7 kg)] 98 lb 8.7 oz (44.7 kg) (07/09 0500) Last BM Date: 04/01/17  Intake/Output from previous day: 07/08 0701 - 07/09 0700 In: 1615.8 [P.O.:840; I.V.:375.8; IV Piggyback:400] Out: 600 [Urine:600] Intake/Output this shift: No intake/output data recorded.  General appearance: cooperative and no distress GI: soft, non-tender; bowel sounds normal; no masses,  no organomegaly  Lab Results:   Recent Labs  04/03/17 0320 04/04/17 0502  WBC 15.6* 16.4*  HGB 12.1 11.8*  HCT 39.0 38.2  PLT 202 192   BMET  Recent Labs  04/03/17 0320 04/04/17 0502  NA 144 147*  K 3.5 3.0*  CL 101 98*  CO2 29 40*  GLUCOSE 122* 151*  BUN 26* 24*  CREATININE 0.98 0.93  CALCIUM 8.5* 8.4*   PT/INR No results for input(s): LABPROT, INR in the last 72 hours.  Studies/Results: Dg Chest Port 1 View  Result Date: 04/02/2017 CLINICAL DATA:  Cough. EXAM: PORTABLE CHEST 1 VIEW COMPARISON:  03/30/2017 FINDINGS: 2 frontal radiographs. Nasogastric tube extends beyond the inferior aspect of the film. Patient rotated to the right. Midline trachea. Normal heart size. Layering bilateral pleural effusions are new or significantly increased. Greater on the right. No pneumothorax. progression of moderate interstitial edema. Development of bibasilar airspace disease. IMPRESSION: Worsened aeration, with new or increased moderate congestive heart failure. Development of bilateral pleural effusions and adjacent airspace disease, most likely atelectasis. Infection or aspiration cannot be excluded. Electronically Signed    By: Abigail Miyamoto M.D.   On: 04/02/2017 11:14   Dg Abd 2 Views  Result Date: 04/03/2017 CLINICAL DATA:  Patient with history of small bowel obstruction. EXAM: ABDOMEN - 2 VIEW COMPARISON:  Abdominal radiograph 04/01/2017 FINDINGS: Enteric tube tip and side-port project over the stomach. Oral contrast material within the colon. Stable gaseous distended loops of small bowel within the central abdomen. Heterogeneous opacities lung bases bilaterally. Small bilateral pleural effusions. IMPRESSION: Enteric tube tip and side-port project over the stomach. Unchanged gaseous distended loops of bowel within the central abdomen. Layering effusions and underlying opacities. Electronically Signed   By: Lovey Newcomer M.D.   On: 04/03/2017 09:13    Anti-infectives: Anti-infectives    Start     Dose/Rate Route Frequency Ordered Stop   04/03/17 1500  Ampicillin-Sulbactam (UNASYN) 3 g in sodium chloride 0.9 % 100 mL IVPB     3 g 200 mL/hr over 30 Minutes Intravenous Every 12 hours 04/03/17 1312        Assessment/Plan: Impression: Small bowel obstruction, resolved. Now dealing with probable aspiration pneumonia, SVT. Will get speech therapy consultation for swallowing difficulties. Full liquid diet.  LOS: 4 days    Aviva Signs 04/04/2017

## 2017-04-05 ENCOUNTER — Inpatient Hospital Stay (HOSPITAL_COMMUNITY): Payer: Medicare Other

## 2017-04-05 LAB — CBC WITH DIFFERENTIAL/PLATELET
BASOS PCT: 0 %
Basophils Absolute: 0 10*3/uL (ref 0.0–0.1)
EOS PCT: 0 %
Eosinophils Absolute: 0 10*3/uL (ref 0.0–0.7)
HEMATOCRIT: 39.7 % (ref 36.0–46.0)
Hemoglobin: 12.3 g/dL (ref 12.0–15.0)
Lymphocytes Relative: 10 %
Lymphs Abs: 1.1 10*3/uL (ref 0.7–4.0)
MCH: 30.1 pg (ref 26.0–34.0)
MCHC: 31 g/dL (ref 30.0–36.0)
MCV: 97.1 fL (ref 78.0–100.0)
MONO ABS: 0.5 10*3/uL (ref 0.1–1.0)
MONOS PCT: 5 %
NEUTROS ABS: 8.8 10*3/uL — AB (ref 1.7–7.7)
Neutrophils Relative %: 85 %
Platelets: 190 10*3/uL (ref 150–400)
RBC: 4.09 MIL/uL (ref 3.87–5.11)
RDW: 13 % (ref 11.5–15.5)
WBC: 10.4 10*3/uL (ref 4.0–10.5)

## 2017-04-05 LAB — BASIC METABOLIC PANEL
Anion gap: 7 (ref 5–15)
BUN: 25 mg/dL — ABNORMAL HIGH (ref 6–20)
CALCIUM: 8.9 mg/dL (ref 8.9–10.3)
CO2: 40 mmol/L — AB (ref 22–32)
CREATININE: 0.98 mg/dL (ref 0.44–1.00)
Chloride: 98 mmol/L — ABNORMAL LOW (ref 101–111)
GFR calc non Af Amer: 51 mL/min — ABNORMAL LOW (ref >60)
GFR, EST AFRICAN AMERICAN: 59 mL/min — AB (ref >60)
GLUCOSE: 123 mg/dL — AB (ref 65–99)
Potassium: 3.5 mmol/L (ref 3.5–5.1)
Sodium: 145 mmol/L (ref 135–145)

## 2017-04-05 LAB — MAGNESIUM: Magnesium: 1.9 mg/dL (ref 1.7–2.4)

## 2017-04-05 LAB — BRAIN NATRIURETIC PEPTIDE: B NATRIURETIC PEPTIDE 5: 433 pg/mL — AB (ref 0.0–100.0)

## 2017-04-05 MED ORDER — PANTOPRAZOLE SODIUM 40 MG IV SOLR
40.0000 mg | INTRAVENOUS | Status: DC
  Administered 2017-04-06 – 2017-04-12 (×7): 40 mg via INTRAVENOUS
  Filled 2017-04-05 (×7): qty 40

## 2017-04-05 MED ORDER — LEVOTHYROXINE SODIUM 100 MCG IV SOLR
25.0000 ug | Freq: Every day | INTRAVENOUS | Status: DC
  Administered 2017-04-06 – 2017-04-07 (×2): 25 ug via INTRAVENOUS
  Filled 2017-04-05 (×3): qty 5

## 2017-04-05 MED ORDER — METOPROLOL TARTRATE 5 MG/5ML IV SOLN
5.0000 mg | Freq: Four times a day (QID) | INTRAVENOUS | Status: DC
  Administered 2017-04-05 – 2017-04-07 (×8): 5 mg via INTRAVENOUS
  Filled 2017-04-05 (×8): qty 5

## 2017-04-05 MED ORDER — FUROSEMIDE 10 MG/ML IJ SOLN
40.0000 mg | Freq: Two times a day (BID) | INTRAMUSCULAR | Status: DC
  Administered 2017-04-05: 40 mg via INTRAVENOUS
  Filled 2017-04-05: qty 4

## 2017-04-05 MED ORDER — MILK AND MOLASSES ENEMA
1.0000 | Freq: Once | RECTAL | Status: AC
  Administered 2017-04-05: 250 mL via RECTAL

## 2017-04-05 MED ORDER — LEVOTHYROXINE SODIUM 25 MCG PO TABS: 50.0000 ug | ORAL_TABLET | Freq: Every day | ORAL | Status: DC

## 2017-04-05 MED ORDER — DILTIAZEM HCL 100 MG IV SOLR
5.0000 mg/h | INTRAVENOUS | Status: DC
  Administered 2017-04-05 – 2017-04-06 (×2): 5 mg/h via INTRAVENOUS
  Administered 2017-04-07: 10 mg/h via INTRAVENOUS
  Filled 2017-04-05 (×3): qty 100

## 2017-04-05 MED ORDER — SIMETHICONE 80 MG PO CHEW
80.0000 mg | CHEWABLE_TABLET | Freq: Two times a day (BID) | ORAL | Status: DC
  Administered 2017-04-05 – 2017-04-12 (×14): 80 mg via ORAL
  Filled 2017-04-05 (×14): qty 1

## 2017-04-05 MED ORDER — DILTIAZEM HCL 60 MG PO TABS
60.0000 mg | ORAL_TABLET | Freq: Four times a day (QID) | ORAL | Status: DC
  Administered 2017-04-05 (×2): 60 mg via ORAL
  Filled 2017-04-05 (×2): qty 1

## 2017-04-05 NOTE — Care Management Note (Signed)
Case Management Note  Patient Details  Name: Andrea [name] MRN: [id number] Date of Birth: Dec 08, 1928  If discussed at Long Length of Stay Meetings, dates discussed:  04/05/2017  Additional Comments:  Amaka Gluth, Chauncey Reading, RN 04/05/2017, 3:20 PM

## 2017-04-05 NOTE — Progress Notes (Addendum)
PROGRESS NOTE  Andrea [name]:[id number] DOB: 04-17-1929 DOA: 03/30/2017 PCP: Unk Pinto, MD Brief History: 83 year old female with a history of bronchiectasis/COPD, chronic respiratory failure on 2 L at night, hypertension, hyperlipidemia, diastolic CHF, hypothyroidism, and PATpresented with a one to two-day history of abdominal pain with associated nausea and vomiting. The patient denied any fevers, chills, chest pain, shortness breath, diarrhea, hematochezia, melena. CT of the abdomen and pelvis at the time of admission showed dilated small bowel loops up to 4.6 cm in the mid pelvis with a transition zone in the right pelvis. NG tube was inserted for decompression and Gen. surgery was consulted to assist with management. Overall, her bowel function has improved, and her NG tube was discontinued. Her diet was advanced which she tolerated. Her hospitalization has been complicated by atrial tachycardia/SVT, acute on chronic diastolic CHF, and aspiration pneumonia.  Assessment/Plan: Small bowel obstruction -Likely due to adhesions -resolving -Continue NG tube decompression-->600ccoutput in past 24 hours -7/7-7/9--4 BMs -Appreciategeneral surgery-->continue nonsurgical management -7/9--started full liquids per surgery -04/05/17--start dysphagia 2 diet with thin liquids -7/8 AXR--unchanged gaseous distention of loops of small bowel  SVT/Atrial tachycardia -04/02/17 evening--developed Afib RVR with HR 150-160s -spontaneously converted to sinus am 04/04/17 -continue IV lopressor  -cardizem drip turned off early am 7/9-->went back to atrial tach -restart diltiazem drip 04/04/17 late afternoom-->change to diltiazem 60 mg po q 6 hours -EKG personallyreviewed -TSH--0.936 -04/22/2016 echo EF 60-65%, grade 2 DD, no WMA, PASP 55 -04/03/17 echo--EF 60-65%, no WMA, PASP 42  Aspiration pneumonia -due to vomiting from SBO -initially NG was inserted into trachea -start  Unasyn  Acute on Chronic diastolic CHF -Daily weights--NEG 5 lbs -04/04/17 CXR--bilateral pleural effusions unchanged, improving vascular congestion -Increase lasix 40 mg IV QD-->BID 7/10 -Saline lockIV fluids -04/03/17 echo--EF 60-65%, no WMA, PASP 42  COPD/bronchiectasis/chronic respiratory failure with hypoxia -On 2 L nasal cannula and nighttime at home -Presently stable on 3L -start duonebs  Hypokalemia/Hypomagnesemia -repleted -recheck mag--1.9  Essential hypertension -Holding bisoprolol/HCTZ initially due to SBO -Holding amlodipine and losartan initially due to SBO -hydralazine IV prn SBP >180  Anxiety -IV ativan q 8 hrs prn until able to take po xanax  Hypothyroidism -Continue Synthroid IV    Disposition Plan: Home in 2-3 days; remain in SDU Family Communication: Daughtersupdatedat bedside 7/10--Total time spent 35 minutes. Greater than 50% spent face to face counseling and coordinating care.   Consultants: General surgery  Code Status: FULL   DVT Prophylaxis: [place] Lovenox   Procedures: As Listed in Progress Note Above  Antibiotics: Unasyn 04/03/17>>>    Subjective: Patient denies fevers, chills, headache, chest pain, dyspnea, nausea, vomiting, diarrhea, abdominal pain, dysuria, hematuria, hematochezia, and melena.   Objective: Vitals:   04/05/17 1130 04/05/17 1200 04/05/17 1230 04/05/17 1300  BP: 121/63 (!) 117/52 127/61 113/68  Pulse: 97 96 93 (!) 108  Resp: (!) 21 (!) _0 Temp:  98.1 F (36.7 C)    TempSrc:  Axillary    SpO2: 97% (!) 84% 97% 97%  Weight:      Height:        Intake/Output Summary (Last 24 hours) at 04/05/17 1426 Last data filed at 04/05/17 1200  Gross per 24 hour  Intake           581.49 ml  Output              350 ml  Net  231.49 ml   Weight change: -0.2 kg (-7.1 oz) Exam:   General:  Pt is alert, follows commands appropriately, not in acute distress  HEENT: No icterus, No  thrush, No neck mass, [place]/AT  Cardiovascular: RRR, S1/S2, no rubs, no gallops  Respiratory: bibasilar crackles, no wheeze  Abdomen: Soft/+BS, non tender, non distended, no guarding  Extremities: trace LE edema, No lymphangitis, No petechiae, No rashes, no synovitis   Data Reviewed: I have personally reviewed following labs and imaging studies Basic Metabolic Panel:  Recent Labs Lab 04/01/17 0548 04/02/17 0605 04/03/17 0320 04/04/17 0502 04/05/17 0359  NA 140 145 144 147* 145  K 3.8 3.3* 3.5 3.0* 3.5  CL 100* 105 101 98* 98*  CO2 30 32 29 40* 40*  GLUCOSE 76 77 122* 151* 123*  BUN 34* 25* 26* 24* 25*  CREATININE 1.08* 0.81 0.98 0.93 0.98  CALCIUM 8.3* 8.4* 8.5* 8.4* 8.9  MG 1.8 1.7  --  1.4* 1.9   Liver Function Tests:  Recent Labs Lab 03/30/17 1850 03/31/17 0630  AST 30 19  ALT 12* 9*  ALKPHOS 75 59  BILITOT 1.0 1.0  PROT 8.3* 6.4*  ALBUMIN 4.2 3.3*    Recent Labs Lab 03/30/17 1850  LIPASE 47   No results for input(s): AMMONIA in the last 168 hours. Coagulation Profile: No results for input(s): INR, PROTIME in the last 168 hours. CBC:  Recent Labs Lab 03/31/17 0630 04/02/17 0605 04/03/17 0320 04/04/17 0502 04/05/17 0359  WBC 11.9* 14.2* 15.6* 16.4* 10.4  NEUTROABS  --   --   --   --  8.8*  HGB 11.8* 11.6* 12.1 11.8* 12.3  HCT 37.2 38.0 39.0 38.2 39.7  MCV 93.0 96.9 96.8 97.0 97.1  PLT 231 170 202 192 190   Cardiac Enzymes: No results for input(s): CKTOTAL, CKMB, CKMBINDEX, TROPONINI in the last 168 hours. BNP: Invalid input(s): POCBNP CBG:  Recent Labs Lab 04/04/17 0815  GLUCAP 128*   HbA1C: No results for input(s): HGBA1C in the last 72 hours. Urine analysis:    Component Value Date/Time   COLORURINE YELLOW 03/30/2017 2130   APPEARANCEUR CLEAR 03/30/2017 2130   LABSPEC 1.015 03/30/2017 2130   PHURINE 7.0 03/30/2017 2130   GLUCOSEU NEGATIVE 03/30/2017 2130   HGBUR NEGATIVE 03/30/2017 2130   [place] NEGATIVE 03/30/2017 2130    KETONESUR 5 (A) 03/30/2017 2130   PROTEINUR 30 (A) 03/30/2017 2130   UROBILINOGEN 0.2 08/20/2014 1613   NITRITE NEGATIVE 03/30/2017 2130   LEUKOCYTESUR NEGATIVE 03/30/2017 2130   Sepsis Labs: _0 (procalcitonin:4,lacticidven:4) ) Recent Results (from the past 240 hour(s))  MRSA PCR Screening     Status: None   Collection Time: 04/03/17 10:39 AM  Result Value Ref Range Status   MRSA by PCR NEGATIVE NEGATIVE Final    Comment:        The GeneXpert MRSA Assay (FDA approved for NASAL specimens only), is one component of a comprehensive MRSA colonization surveillance program. It is not intended to diagnose MRSA infection nor to guide or monitor treatment for MRSA infections.      Scheduled Meds: . bisacodyl  10 mg Rectal q morning - 10a  . diltiazem  10 mg Intravenous Once  . enoxaparin (LOVENOX) injection  30 mg Subcutaneous Q24H  . furosemide  40 mg Intravenous Daily  . ipratropium-albuterol  3 mL Nebulization Q8H  . [START ON 04/06/2017] levothyroxine  50 mcg Oral QAC breakfast  . mouth rinse  15 mL Mouth Rinse BID  . metoprolol  tartrate  2.5 mg Intravenous Q6H  . pantoprazole  40 mg Oral Daily  . senna-docusate  1 tablet Oral QHS   Continuous Infusions: . ampicillin-sulbactam (UNASYN) IV Stopped (04/05/17 0305)  . diltiazem (CARDIZEM) infusion 7.5 mg/hr (04/05/17 1200)    Procedures/Studies: Dg Abd 1 View  Result Date: 04/01/2017 CLINICAL DATA:  NG tube position. EXAM: ABDOMEN - 1 VIEW COMPARISON:  04/01/2017 FINDINGS: NG tube tip is in the mid stomach with the side port in the proximal stomach. IMPRESSION: NG tube tip in the mid stomach. Electronically Signed   By: Rolm Baptise M.D.   On: 04/01/2017 11:49   Ct Abdomen Pelvis W Contrast  Result Date: 03/30/2017 CLINICAL DATA:  Abdominal pain with bloating.  Nausea and vomiting. EXAM: CT ABDOMEN AND PELVIS WITH CONTRAST TECHNIQUE: Multidetector CT imaging of the abdomen and pelvis was performed using the standard  protocol following bolus administration of intravenous contrast. CONTRAST:  73m ISOVUE-300 IOPAMIDOL (ISOVUE-300) INJECTION 61%, 1044mISOVUE-300 IOPAMIDOL (ISOVUE-300) INJECTION 61% COMPARISON:  08/15/2007 FINDINGS: Lower chest: Tubular branching structures posterior lung bases suggest impacted airways but difficult to assess given the substantial breathing motion artifact. Hepatobiliary: 7 mm hypervascular lesion subcapsular left liver cannot be further characterized. Mild intrahepatic biliary duct prominence. Gallbladder unremarkable. Extrahepatic common bile duct measures 8 mm diameter, increased in the interval. Pancreas: No focal mass lesion. No dilatation of the main duct. No intraparenchymal cyst. No peripancreatic edema. Spleen: No splenomegaly. No focal mass lesion. Adrenals/Urinary Tract: No adrenal nodule or mass. Cortical scarring noted both kidneys with small renal cysts noted bilaterally. No evidence for hydroureter. Bladder is distended. Stomach/Bowel: Stomach is mildly distended. Duodenum unremarkable. Small bowel loops in the lower abdomen and pelvis are fluid-filled and dilated. A prominently dilated loop in the mid pelvis measures up to 4.6 cm diameter. This dilated loop pin tracts down to the pelvis where a discrete transition point is identified in the right pelvis (see image 53 series 2 and also visible on sagittal image 52 of series 6). I cannot identify dilated small bowel beyond this transition zone to suggest a closed loop etiology. The terminal ileum is collapsed. The appendix is not visualized, but there is no edema or inflammation in the region of the cecum. Diverticuli are seen scattered along the entire length of the colon without CT findings of diverticulitis. Vascular/Lymphatic: There is abdominal aortic atherosclerosis without aneurysm. There is no gastrohepatic or hepatoduodenal ligament lymphadenopathy. No intraperitoneal or retroperitoneal lymphadenopathy. No pelvic sidewall  lymphadenopathy. Reproductive: Uterus is surgically absent. There is no adnexal mass. Other: Free fluid is identified around the liver and spleen. Interloop mesenteric fluid is identified in the central small bowel mesentery. Musculoskeletal: Bone windows reveal no worrisome lytic or sclerotic osseous lesions. IMPRESSION: 1. Mechanical small bowel obstruction with small bowel loops dilated up to 4.6 cm diameter and a definite transition zone identified in the right pelvis. Although there is some question of twisting in the region of the transition zone, there is no second transition zone identified nor dilated small bowel after the transition zone to suggest a closed loop obstruction. As such, imaging features likely related to an adhesion given the history of hysterectomy. There is fluid around the liver and spleen with interloop mesenteric fluid associated with the abnormal small bowel loops. No small bowel wall thickening or pneumatosis at this time to suggest overt bowel ischemia. 2. Probable airway impaction in both posterior lower lobes although not well assessed given motion artifact. Electronically Signed   By: ErRandall Hiss  Tery Sanfilippo M.D.   On: 03/30/2017 21:51   Dg Chest Port 1 View  Result Date: 04/04/2017 CLINICAL DATA:  Acute on chronic diastolic heart failure. EXAM: PORTABLE CHEST 1 VIEW COMPARISON:  Radiograph 04/02/2017, 03/30/2017 FINDINGS: Stable heart size and mediastinal contours. Stable hazy opacity at both lung bases likely comminution of pleural fluid and atelectasis. Pulmonary edema with slight improvement from prior exam. Biapical pleuroparenchymal scarring again seen. No pneumothorax or new confluent airspace disease. Chronic change in the right shoulder. IMPRESSION: Slight improvement in congestive heart failure with mild decrease in pulmonary edema. Bilateral pleural effusions are unchanged. Electronically Signed   By: Jeb Levering M.D.   On: 04/04/2017 16:01   Dg Chest Port 1  View  Result Date: 04/02/2017 CLINICAL DATA:  Cough. EXAM: PORTABLE CHEST 1 VIEW COMPARISON:  03/30/2017 FINDINGS: 2 frontal radiographs. Nasogastric tube extends beyond the inferior aspect of the film. Patient rotated to the right. Midline trachea. Normal heart size. Layering bilateral pleural effusions are new or significantly increased. Greater on the right. No pneumothorax. progression of moderate interstitial edema. Development of bibasilar airspace disease. IMPRESSION: Worsened aeration, with new or increased moderate congestive heart failure. Development of bilateral pleural effusions and adjacent airspace disease, most likely atelectasis. Infection or aspiration cannot be excluded. Electronically Signed   By: Abigail Miyamoto M.D.   On: 04/02/2017 11:14   Dg Chest Portable 1 View  Result Date: 03/30/2017 CLINICAL DATA:  Nasogastric tube placement. Generalized abdominal pain and bloating, acute onset. Initial encounter. EXAM: PORTABLE CHEST 1 VIEW COMPARISON:  Chest radiograph performed 06/10/2016 FINDINGS: The patient's enteric tube is seen extending below the diaphragm. The lungs are hyperexpanded, with biapical scarring. Vascular congestion is noted, with increased interstitial markings. Mild interstitial edema cannot be excluded. No pleural effusion or pneumothorax is seen. The cardiomediastinal silhouette is borderline normal in size. No acute osseous abnormalities are seen. Mild degenerative change is noted at the glenohumeral joints bilaterally. IMPRESSION: 1. Enteric tube noted extending below the diaphragm. 2. Lungs hyperexpanded, with biapical scarring, likely reflecting COPD. Underlying vascular congestion, with increased interstitial markings. Mild interstitial edema cannot be excluded. Electronically Signed   By: Garald Balding M.D.   On: 03/30/2017 23:55   Dg Abd 2 Views  Result Date: 04/03/2017 CLINICAL DATA:  Patient with history of small bowel obstruction. EXAM: ABDOMEN - 2 VIEW  COMPARISON:  Abdominal radiograph 04/01/2017 FINDINGS: Enteric tube tip and side-port project over the stomach. Oral contrast material within the colon. Stable gaseous distended loops of small bowel within the central abdomen. Heterogeneous opacities lung bases bilaterally. Small bilateral pleural effusions. IMPRESSION: Enteric tube tip and side-port project over the stomach. Unchanged gaseous distended loops of bowel within the central abdomen. Layering effusions and underlying opacities. Electronically Signed   By: Lovey Newcomer M.D.   On: 04/03/2017 09:13   Dg Abd 2 Views  Result Date: 04/01/2017 CLINICAL DATA:  Followup small bowel obstruction. EXAM: ABDOMEN - 2 VIEW COMPARISON:  03/31/2017 FINDINGS: There are persistent loops of dilated central small bowel with air-fluid levels consistent with a partial small bowel obstruction, similar to the prior study. There is no free air. Nasogastric tube extends below the diaphragm into the mid stomach. IMPRESSION: 1. Persistent partial small bowel obstruction. No significant change from the prior study. No free air. Electronically Signed   By: Lajean Manes M.D.   On: 04/01/2017 08:42   Dg Abd 2 Views  Result Date: 03/31/2017 CLINICAL DATA:  Follow-up small bowel obstruction. EXAM: ABDOMEN - 2  VIEW COMPARISON:  03/30/2017 abdominal CT FINDINGS: An NG tube is identified with tip overlying the proximal-mid stomach. Dilated small bowel loops are again noted and not significantly changed. There is no evidence of pneumoperitoneum. No other changes identified. IMPRESSION: Little significant change of dilated small bowel loops compatible with small bowel obstruction. No evidence of pneumoperitoneum. NG tube with tip overlying the proximal-mid stomach. Electronically Signed   By: Margarette Canada M.D.   On: 03/31/2017 21:12   Dg Swallowing Func-speech Pathology  Result Date: 04/05/2017 Objective Swallowing Evaluation: Type of Study: MBS-Modified Barium Swallow Study Patient  Details Name: SCOTTLYN MCHANEY MRN: [id number] Date of Birth: 06-Apr-1929 Today's Date: 04/05/2017 Time: SLP Start Time (ACUTE ONLY): 0805-SLP Stop Time (ACUTE ONLY): 0819 SLP Time Calculation (min) (ACUTE ONLY): 14 min Past Medical History: Past Medical History: Diagnosis Date . Aortic regurgitation  . Bronchiectasis ([place])  . COPD (chronic obstructive pulmonary disease) ([place])  . DJD (degenerative joint disease)  . HLD (hyperlipidemia)  . HTN (hypertension)  . Mild mitral regurgitation by prior echocardiogram  . Mild tricuspid regurgitation  . Palpitations   a. has known history of PAC's and PVC's. b. 09/2015: monitor showed sinus rhythm with occasional PAC's and a brief episode of PAT. [name] Thyroid disease  Past Surgical History: Past Surgical History: Procedure Laterality Date . ABDOMINAL HYSTERECTOMY  1993 . CATARACT EXTRACTION, BILATERAL   HPI: 83 year old female with a history of bronchiectasis/COPD, chronic respiratory failure on 2 L at night, hypertension, hyperlipidemia, diastolic CHF, hypothyroidism, and PATpresented with a one to two-day history of abdominal pain with associated nausea and vomiting. The patient denied any fevers, chills, chest pain, shortness breath, diarrhea, hematochezia, melena. CT of the abdomen and pelvis at the time of admission showed dilated small bowel loops up to 4.6 cm in the mid pelvis with a transition zone in the right pelvis. NG tube was inserted for decompression and Gen. surgery was consulted to assist with management. Overall, her bowel function has improved, and her NG tube was discontinued 7/8. Her diet was advanced which she tolerated. Her hospitalization has been Located by atrial tachycardia/atrial fibrillation, acute on chronic diastolic CHF, and aspiration pneumonia. Pt and family report continued swallowing difficulty. ST will evaluate and treat as indicated. No Data Recorded Assessment / Plan / Recommendation CHL IP CLINICAL IMPRESSIONS 04/05/2017 Clinical Impression Pt  presents with an inconsistent mild to moderate oropharyngeal dysphagia. Oral phase characterized by decreased bolus cohesion of solids and delayed oral transit. Swallow initiation delayed throughout PO trials, to the level of the valleculae with puree and solids, and to the level of the pyriform sinuses with thin and nectar thick liquids. Prominent vallecular residuals evidenced secondary to reduced tongue base retraction, which were increased with solids and thickened consistencies. Thin liquid alternation aided in clearing vallecular residuals.  Inconsistent penetration occured with thin liquids before the swallow that intermittently reached the level of the vocal cords and was cleared out of laryngeal vestibule intermittently. Volitional throat clear aided in expelling penetrates with cueing from SLP. Nectar thick liquids without penetration. As PO trials progressed pt did evidence cup and straw sips of thin liquids without penetration. Barium tablet administered with thin liquids. Pt displayed difficulty propelling tablet throughout pharynx, initially sticking in the valleculae, then the pyriform sinuses, ultimately requiring nectar thick liquids to clear through pharynx. As pt was struggling to pass pill throughout pharynx, silent aspiration of thin liquids occured after the swallow. Recommend chopped consistencies with thin liquids in isolation. Pt advised to avoid  mixed consistencies as airway protection appears compromised when two consistencies administered at the same time. Recommend medicines whole in puree (crush larger meds as needed). ST to follow up for diet tolerance and education with family for safe swallow strategies  SLP Visit Diagnosis Dysphagia, oropharyngeal phase (R13.12) Attention and concentration deficit following -- Frontal lobe and executive function deficit following -- Impact on safety and function Moderate aspiration risk   CHL IP TREATMENT RECOMMENDATION 04/05/2017 Treatment  Recommendations Therapy as outlined in treatment plan below   Prognosis 04/05/2017 Prognosis for Safe Diet Advancement Good Barriers to Reach Goals -- Barriers/Prognosis Comment -- CHL IP DIET RECOMMENDATION 04/05/2017 SLP Diet Recommendations Dysphagia 2 (Fine chop) solids;Thin liquid;No mixed consistencies Liquid Administration via -- Medication Administration Whole meds with puree Compensations Minimize environmental distractions;Slow rate;Small sips/bites;Multiple dry swallows after each bite/sip;Follow solids with liquid;Clear throat intermittently Postural Changes Remain semi-upright after after feeds/meals (Comment);Seated upright at 90 degrees   CHL IP OTHER RECOMMENDATIONS 04/05/2017 Recommended Consults -- Oral Care Recommendations Oral care BID Other Recommendations --   CHL IP FOLLOW UP RECOMMENDATIONS 04/05/2017 Follow up Recommendations 24 hour supervision/assistance   CHL IP FREQUENCY AND DURATION 04/05/2017 Speech Therapy Frequency (ACUTE ONLY) min 2x/week Treatment Duration 2 weeks      CHL IP ORAL PHASE 04/05/2017 Oral Phase Impaired Oral - Pudding Teaspoon -- Oral - Pudding Cup -- Oral - Honey Teaspoon -- Oral - Honey Cup -- Oral - Nectar Teaspoon Delayed oral transit;Lingual/palatal residue Oral - Nectar Cup Delayed oral transit;Lingual/palatal residue;Weak lingual manipulation Oral - Nectar Straw Weak lingual manipulation;Delayed oral transit;Lingual/palatal residue Oral - Thin Teaspoon Weak lingual manipulation;Delayed oral transit;Lingual/palatal residue Oral - Thin Cup Weak lingual manipulation;Delayed oral transit;Lingual/palatal residue Oral - Thin Straw Delayed oral transit;Weak lingual manipulation;Lingual/palatal residue Oral - Puree Delayed oral transit;Weak lingual manipulation;Lingual pumping Oral - Mech Soft -- Oral - Regular Lingual/palatal residue;Piecemeal swallowing;Delayed oral transit;Decreased bolus cohesion;Weak lingual manipulation Oral - Multi-Consistency -- Oral - Pill Lingual  pumping;Reduced posterior propulsion;Delayed oral transit;Piecemeal swallowing;Lingual/palatal residue Oral Phase - Comment --  CHL IP PHARYNGEAL PHASE 04/05/2017 Pharyngeal Phase Impaired Pharyngeal- Pudding Teaspoon -- Pharyngeal -- Pharyngeal- Pudding Cup -- Pharyngeal -- Pharyngeal- Honey Teaspoon -- Pharyngeal -- Pharyngeal- Honey Cup -- Pharyngeal -- Pharyngeal- Nectar Teaspoon Reduced tongue base retraction;Delayed swallow initiation-vallecula;Delayed swallow initiation-pyriform sinuses;Pharyngeal residue - valleculae Pharyngeal -- Pharyngeal- Nectar Cup Delayed swallow initiation-vallecula;Delayed swallow initiation-pyriform sinuses;Reduced tongue base retraction;Pharyngeal residue - valleculae Pharyngeal -- Pharyngeal- Nectar Straw -- Pharyngeal -- Pharyngeal- Thin Teaspoon Delayed swallow initiation-vallecula;Penetration/Aspiration during swallow;Penetration/Aspiration before swallow;Pharyngeal residue - valleculae;Reduced tongue base retraction;Reduced epiglottic inversion Pharyngeal Material enters airway, CONTACTS cords and then ejected out Pharyngeal- Thin Cup Delayed swallow initiation-vallecula;Reduced tongue base retraction;Pharyngeal residue - valleculae;Penetration/Aspiration during swallow;Penetration/Aspiration before swallow Pharyngeal Material enters airway, CONTACTS cords and not ejected out Pharyngeal- Thin Straw Penetration/Aspiration during swallow;Penetration/Aspiration before swallow;Delayed swallow initiation-vallecula;Reduced tongue base retraction;Pharyngeal residue - valleculae Pharyngeal Material enters airway, remains ABOVE vocal cords and not ejected out Pharyngeal- Puree Delayed swallow initiation-vallecula;Pharyngeal residue - valleculae;Reduced tongue base retraction Pharyngeal -- Pharyngeal- Mechanical Soft -- Pharyngeal -- Pharyngeal- Regular Delayed swallow initiation-vallecula;Pharyngeal residue - valleculae;Reduced tongue base retraction Pharyngeal -- Pharyngeal-  Multi-consistency -- Pharyngeal -- Pharyngeal- Pill Delayed swallow initiation-vallecula;Other (Comment);Penetration/Apiration after swallow Pharyngeal Material enters airway, passes BELOW cords and not ejected out despite cough attempt by patient Pharyngeal Comment --  No flowsheet data found. Arvil Chaco MA, CCC-SLP Acute Care Speech Language Pathologist  Levi Aland 04/05/2017, 9:48 AM  Dlynn Ranes, DO  [facility] Pager [phone]  If 7PM-7AM, please contact night-coverage [email or web address] Password [email or web address] 04/05/2017, 2:26 PM   LOS: 5 days

## 2017-04-05 NOTE — Progress Notes (Addendum)
Notified by RN that patient had one episode of emesis after lunch and vomited up meds.  Downgrade diet to clear liquids.   Obtain abd xray for am -am bmp cbc -change diltiazem back to IV -discussed with Dr. Aviva Signs -may need to place NG if vomits again -change essential meds to IV  DTat

## 2017-04-05 NOTE — Progress Notes (Signed)
Modified Barium Swallow Progress Note  Patient Details  Name: Andrea [name] MRN: [id number] Date of Birth: May 23, 1929  Today's Date: 04/05/2017  Modified Barium Swallow completed.  Full report located under Chart Review in the Imaging Section.  Brief recommendations include the following:  Clinical Impression  Pt presents with an inconsistent mild to moderate oropharyngeal dysphagia. Oral phase characterized by decreased bolus cohesion of solids and delayed oral transit. Swallow initiation delayed throughout PO trials, to the level of the valleculae with puree and solids, and to the level of the pyriform sinuses with thin and nectar thick liquids. Prominent vallecular residuals evidenced secondary to reduced tongue base retraction, which were increased with solids and thickened consistencies. Thin liquid alternation aided in clearing vallecular residuals.  Inconsistent penetration occured with thin liquids before the swallow that intermittently reached the level of the vocal cords and was cleared out of laryngeal vestibule intermittently. Volitional throat clear aided in expelling penetrates with cueing from SLP. Nectar thick liquids without penetration. As PO trials progressed pt did evidence cup and straw sips of thin liquids without penetration. Barium tablet administered with thin liquids. Pt displayed difficulty propeling tablet throughout pharynx, initially sticking in the valleculae, then the pyriform sinuses, ultimately requiring nectar thick liquids to propel through pharynx. As pt was struggling to pass pill throughout pharynx, silent aspiration of thin liquids occured after the swallow. Recommend chopped consistencies with thin liquids in isolation. Pt advised to avoid mixed consistencies as airway protection appears compromised when two consistencies administered at the same time. Recommend medicines whole in puree (crush larger meds as needed). ST to follow up for diet tolerance and education  with family for safe swallow strategies    Swallow Evaluation Recommendations       SLP Diet Recommendations: Dysphagia 2 (Fine chop) solids;Thin liquid;No mixed consistencies       Medication Administration: Whole meds with puree   Supervision: Full supervision/cueing for compensatory strategies;Staff to assist with self feeding   Compensations: Minimize environmental distractions;Slow rate;Small sips/bites;Multiple dry swallows after each bite/sip;Follow solids with liquid;Clear throat intermittently   Postural Changes: Remain semi-upright after after feeds/meals (Comment);Seated upright at 90 degrees   Oral Care Recommendations: Oral care BID       Arvil Chaco MA, CCC-SLP Acute Care Speech Language Pathologist    [place] 04/05/2017,9:51 AM

## 2017-04-06 LAB — CBC
HEMATOCRIT: 40.7 % (ref 36.0–46.0)
Hemoglobin: 12.4 g/dL (ref 12.0–15.0)
MCH: 30.2 pg (ref 26.0–34.0)
MCHC: 30.5 g/dL (ref 30.0–36.0)
MCV: 99.3 fL (ref 78.0–100.0)
PLATELETS: 218 10*3/uL (ref 150–400)
RBC: 4.1 MIL/uL (ref 3.87–5.11)
RDW: 13.1 % (ref 11.5–15.5)
WBC: 8.5 10*3/uL (ref 4.0–10.5)

## 2017-04-06 LAB — BASIC METABOLIC PANEL
Anion gap: 14 (ref 5–15)
BUN: 33 mg/dL — AB (ref 6–20)
CHLORIDE: 97 mmol/L — AB (ref 101–111)
CO2: 34 mmol/L — ABNORMAL HIGH (ref 22–32)
CREATININE: 1.14 mg/dL — AB (ref 0.44–1.00)
Calcium: 8.9 mg/dL (ref 8.9–10.3)
GFR, EST AFRICAN AMERICAN: 49 mL/min — AB (ref >60)
GFR, EST NON AFRICAN AMERICAN: 42 mL/min — AB (ref >60)
Glucose, Bld: 146 mg/dL — ABNORMAL HIGH (ref 65–99)
POTASSIUM: 3.6 mmol/L (ref 3.5–5.1)
SODIUM: 145 mmol/L (ref 135–145)

## 2017-04-06 MED ORDER — MORPHINE SULFATE (PF) 2 MG/ML IV SOLN
2.0000 mg | Freq: Four times a day (QID) | INTRAVENOUS | Status: DC | PRN
  Administered 2017-04-06: 2 mg via INTRAVENOUS
  Filled 2017-04-06: qty 1

## 2017-04-06 NOTE — Progress Notes (Signed)
Pharmacy Antibiotic Note  Andrea [name] is a 83 y.o. female admitted on 03/30/2017 with aspiration pneumonia.  Pharmacy has been consulted for Unasyn dosing.  Plan: Unasyn 3 GM IV every 12 hours Monitor renal function Labs per protocol F/U cultures  Height: _0  (160 cm) Weight: 98 lb 1.7 oz (44.5 kg) IBW/kg (Calculated) : 52.4  Temp (24hrs), Avg:98.1 F (36.7 C), Min:97.2 F (36.2 C), Max:98.9 F (37.2 C)   Recent Labs Lab 04/02/17 0605 04/03/17 0320 04/04/17 0502 04/05/17 0359 04/06/17 0448  WBC 14.2* 15.6* 16.4* 10.4 8.5  CREATININE 0.81 0.98 0.93 0.98 1.14*    Estimated Creatinine Clearance: 24.9 mL/min (A) (by C-G formula based on SCr of 1.14 mg/dL (H)).    Allergies  Allergen Reactions  . Vancomycin     Worsening kidney function  . Sulfa Antibiotics Palpitations    Unknown    Antimicrobials this admission: Unasyn 7/8 >>    Thank you for allowing pharmacy to be a part of this patient's care.  Excell Seltzer [place] 04/06/2017 8:54 AM

## 2017-04-06 NOTE — Progress Notes (Signed)
Pt has vomited about 200cc yellow emesis. MD made aware. Two attempts at replacing NGT unsuccessful as the patient is unable to tolerate the tube going down her throat without vomiting. Tried giving 9m morphine to the patient before 2nd attempt. This did help with the pain but did not help patient relax anymore during insertion. Will let Dr. JArnoldo Moraleknow in the AM. Continue to monitor pt, keep strictly NPO, and elevate head of bed to near 90 degrees.

## 2017-04-06 NOTE — Progress Notes (Signed)
Subjective: Patient more alert today. Had 3 bowel movements yesterday evening.  Objective: Vital signs in last 24 hours: Temp:  [97.2 F (36.2 C)-98.9 F (37.2 C)] 98.1 F (36.7 C) (07/11 0741) Pulse Rate:  [77-143] 79 (07/11 0741) Resp:  [15-25] 21 (07/11 0741) BP: (100-152)/(48-127) 125/53 (07/11 0700) SpO2:  [84 %-99 %] 98 % (07/11 0741) Weight:  [98 lb 1.7 oz (44.5 kg)] 98 lb 1.7 oz (44.5 kg) (07/11 0500) Last BM Date: 04/03/17  Intake/Output from previous day: 07/10 0701 - 07/11 0700 In: 304.8 [I.V.:104.8; IV Piggyback:200] Out: -  Intake/Output this shift: No intake/output data recorded.  General appearance: alert, cooperative and no distress GI: soft, non-tender; bowel sounds normal; no masses,  no organomegaly  Lab Results:   Recent Labs  04/05/17 0359 04/06/17 0448  WBC 10.4 8.5  HGB 12.3 12.4  HCT 39.7 40.7  PLT 190 218   BMET  Recent Labs  04/05/17 0359 04/06/17 0448  NA 145 145  K 3.5 3.6  CL 98* 97*  CO2 40* 34*  GLUCOSE 123* 146*  BUN 25* 33*  CREATININE 0.98 1.14*  CALCIUM 8.9 8.9   PT/INR No results for input(s): LABPROT, INR in the last 72 hours.  Studies/Results: Dg Chest Port 1 View  Result Date: 04/04/2017 CLINICAL DATA:  Acute on chronic diastolic heart failure. EXAM: PORTABLE CHEST 1 VIEW COMPARISON:  Radiograph 04/02/2017, 03/30/2017 FINDINGS: Stable heart size and mediastinal contours. Stable hazy opacity at both lung bases likely comminution of pleural fluid and atelectasis. Pulmonary edema with slight improvement from prior exam. Biapical pleuroparenchymal scarring again seen. No pneumothorax or new confluent airspace disease. Chronic change in the right shoulder. IMPRESSION: Slight improvement in congestive heart failure with mild decrease in pulmonary edema. Bilateral pleural effusions are unchanged. Electronically Signed   By: Jeb Levering M.D.   On: 04/04/2017 16:01   Dg Abd 2 Views  Result Date: 04/05/2017 CLINICAL  DATA:  Vomiting with nausea EXAM: ABDOMEN - 2 VIEW COMPARISON:  04/03/2017 FINDINGS: Small bilateral effusions with dense left lower lobe consolidation and patchy infiltrate at the right base. No definite free air beneath the diaphragm. Some contrast present in the colon. Contrast opacifies dilated small bowel in the lower abdomen and pelvis, measuring up to 4 cm. IMPRESSION: Dilated loops of small bowel containing contrast within the lower abdomen and pelvis consistent with bowel obstruction. There is a small amount of contrast present in the colon. Small bilateral effusions and left greater than right bibasilar infiltrates Electronically Signed   By: Donavan Foil M.D.   On: 04/05/2017 21:07   Dg Swallowing Func-speech Pathology  Result Date: 04/05/2017 Objective Swallowing Evaluation: Type of Study: MBS-Modified Barium Swallow Study Patient Details Name: ANIAYA BACHA MRN: [id number] Date of Birth: 1929/09/23 Today's Date: 04/05/2017 Time: SLP Start Time (ACUTE ONLY): 0805-SLP Stop Time (ACUTE ONLY): 0819 SLP Time Calculation (min) (ACUTE ONLY): 14 min Past Medical History: Past Medical History: Diagnosis Date . Aortic regurgitation  . Bronchiectasis ([place])  . COPD (chronic obstructive pulmonary disease) ([place])  . DJD (degenerative joint disease)  . HLD (hyperlipidemia)  . HTN (hypertension)  . Mild mitral regurgitation by prior echocardiogram  . Mild tricuspid regurgitation  . Palpitations   a. has known history of PAC's and PVC's. b. 09/2015: monitor showed sinus rhythm with occasional PAC's and a brief episode of PAT. [name] Thyroid disease  Past Surgical History: Past Surgical History: Procedure Laterality Date . ABDOMINAL HYSTERECTOMY  1993 . CATARACT EXTRACTION, BILATERAL  HPI: 83 year old female with a history of bronchiectasis/COPD, chronic respiratory failure on 2 L at night, hypertension, hyperlipidemia, diastolic CHF, hypothyroidism, and PATpresented with a one to two-day history of abdominal pain with  associated nausea and vomiting. The patient denied any fevers, chills, chest pain, shortness breath, diarrhea, hematochezia, melena. CT of the abdomen and pelvis at the time of admission showed dilated small bowel loops up to 4.6 cm in the mid pelvis with a transition zone in the right pelvis. NG tube was inserted for decompression and Gen. surgery was consulted to assist with management. Overall, her bowel function has improved, and her NG tube was discontinued 7/8. Her diet was advanced which she tolerated. Her hospitalization has been Located by atrial tachycardia/atrial fibrillation, acute on chronic diastolic CHF, and aspiration pneumonia. Pt and family report continued swallowing difficulty. ST will evaluate and treat as indicated. No Data Recorded Assessment / Plan / Recommendation CHL IP CLINICAL IMPRESSIONS 04/05/2017 Clinical Impression Pt presents with an inconsistent mild to moderate oropharyngeal dysphagia. Oral phase characterized by decreased bolus cohesion of solids and delayed oral transit. Swallow initiation delayed throughout PO trials, to the level of the valleculae with puree and solids, and to the level of the pyriform sinuses with thin and nectar thick liquids. Prominent vallecular residuals evidenced secondary to reduced tongue base retraction, which were increased with solids and thickened consistencies. Thin liquid alternation aided in clearing vallecular residuals.  Inconsistent penetration occured with thin liquids before the swallow that intermittently reached the level of the vocal cords and was cleared out of laryngeal vestibule intermittently. Volitional throat clear aided in expelling penetrates with cueing from SLP. Nectar thick liquids without penetration. As PO trials progressed pt did evidence cup and straw sips of thin liquids without penetration. Barium tablet administered with thin liquids. Pt displayed difficulty propelling tablet throughout pharynx, initially sticking in the  valleculae, then the pyriform sinuses, ultimately requiring nectar thick liquids to clear through pharynx. As pt was struggling to pass pill throughout pharynx, silent aspiration of thin liquids occured after the swallow. Recommend chopped consistencies with thin liquids in isolation. Pt advised to avoid mixed consistencies as airway protection appears compromised when two consistencies administered at the same time. Recommend medicines whole in puree (crush larger meds as needed). ST to follow up for diet tolerance and education with family for safe swallow strategies  SLP Visit Diagnosis Dysphagia, oropharyngeal phase (R13.12) Attention and concentration deficit following -- Frontal lobe and executive function deficit following -- Impact on safety and function Moderate aspiration risk   CHL IP TREATMENT RECOMMENDATION 04/05/2017 Treatment Recommendations Therapy as outlined in treatment plan below   Prognosis 04/05/2017 Prognosis for Safe Diet Advancement Good Barriers to Reach Goals -- Barriers/Prognosis Comment -- CHL IP DIET RECOMMENDATION 04/05/2017 SLP Diet Recommendations Dysphagia 2 (Fine chop) solids;Thin liquid;No mixed consistencies Liquid Administration via -- Medication Administration Whole meds with puree Compensations Minimize environmental distractions;Slow rate;Small sips/bites;Multiple dry swallows after each bite/sip;Follow solids with liquid;Clear throat intermittently Postural Changes Remain semi-upright after after feeds/meals (Comment);Seated upright at 90 degrees   CHL IP OTHER RECOMMENDATIONS 04/05/2017 Recommended Consults -- Oral Care Recommendations Oral care BID Other Recommendations --   CHL IP FOLLOW UP RECOMMENDATIONS 04/05/2017 Follow up Recommendations 24 hour supervision/assistance   CHL IP FREQUENCY AND DURATION 04/05/2017 Speech Therapy Frequency (ACUTE ONLY) min 2x/week Treatment Duration 2 weeks      CHL IP ORAL PHASE 04/05/2017 Oral Phase Impaired Oral - Pudding Teaspoon -- Oral -  Pudding Cup -- Oral -  Honey Teaspoon -- Oral - Honey Cup -- Oral - Nectar Teaspoon Delayed oral transit;Lingual/palatal residue Oral - Nectar Cup Delayed oral transit;Lingual/palatal residue;Weak lingual manipulation Oral - Nectar Straw Weak lingual manipulation;Delayed oral transit;Lingual/palatal residue Oral - Thin Teaspoon Weak lingual manipulation;Delayed oral transit;Lingual/palatal residue Oral - Thin Cup Weak lingual manipulation;Delayed oral transit;Lingual/palatal residue Oral - Thin Straw Delayed oral transit;Weak lingual manipulation;Lingual/palatal residue Oral - Puree Delayed oral transit;Weak lingual manipulation;Lingual pumping Oral - Mech Soft -- Oral - Regular Lingual/palatal residue;Piecemeal swallowing;Delayed oral transit;Decreased bolus cohesion;Weak lingual manipulation Oral - Multi-Consistency -- Oral - Pill Lingual pumping;Reduced posterior propulsion;Delayed oral transit;Piecemeal swallowing;Lingual/palatal residue Oral Phase - Comment --  CHL IP PHARYNGEAL PHASE 04/05/2017 Pharyngeal Phase Impaired Pharyngeal- Pudding Teaspoon -- Pharyngeal -- Pharyngeal- Pudding Cup -- Pharyngeal -- Pharyngeal- Honey Teaspoon -- Pharyngeal -- Pharyngeal- Honey Cup -- Pharyngeal -- Pharyngeal- Nectar Teaspoon Reduced tongue base retraction;Delayed swallow initiation-vallecula;Delayed swallow initiation-pyriform sinuses;Pharyngeal residue - valleculae Pharyngeal -- Pharyngeal- Nectar Cup Delayed swallow initiation-vallecula;Delayed swallow initiation-pyriform sinuses;Reduced tongue base retraction;Pharyngeal residue - valleculae Pharyngeal -- Pharyngeal- Nectar Straw -- Pharyngeal -- Pharyngeal- Thin Teaspoon Delayed swallow initiation-vallecula;Penetration/Aspiration during swallow;Penetration/Aspiration before swallow;Pharyngeal residue - valleculae;Reduced tongue base retraction;Reduced epiglottic inversion Pharyngeal Material enters airway, CONTACTS cords and then ejected out Pharyngeal- Thin Cup Delayed  swallow initiation-vallecula;Reduced tongue base retraction;Pharyngeal residue - valleculae;Penetration/Aspiration during swallow;Penetration/Aspiration before swallow Pharyngeal Material enters airway, CONTACTS cords and not ejected out Pharyngeal- Thin Straw Penetration/Aspiration during swallow;Penetration/Aspiration before swallow;Delayed swallow initiation-vallecula;Reduced tongue base retraction;Pharyngeal residue - valleculae Pharyngeal Material enters airway, remains ABOVE vocal cords and not ejected out Pharyngeal- Puree Delayed swallow initiation-vallecula;Pharyngeal residue - valleculae;Reduced tongue base retraction Pharyngeal -- Pharyngeal- Mechanical Soft -- Pharyngeal -- Pharyngeal- Regular Delayed swallow initiation-vallecula;Pharyngeal residue - valleculae;Reduced tongue base retraction Pharyngeal -- Pharyngeal- Multi-consistency -- Pharyngeal -- Pharyngeal- Pill Delayed swallow initiation-vallecula;Other (Comment);Penetration/Apiration after swallow Pharyngeal Material enters airway, passes BELOW cords and not ejected out despite cough attempt by patient Pharyngeal Comment --  No flowsheet data found. Arvil Chaco MA, CCC-SLP Acute Care Speech Language Pathologist  Levi Aland 04/05/2017, 9:48 AM               Anti-infectives: Anti-infectives    Start     Dose/Rate Route Frequency Ordered Stop   04/03/17 1500  Ampicillin-Sulbactam (UNASYN) 3 g in sodium chloride 0.9 % 100 mL IVPB     3 g 200 mL/hr over 30 Minutes Intravenous Every 12 hours 04/03/17 1312        Assessment/Plan: Impression: I think the patient's swallowing difficulties cause her to have episodes of emesis. It was reported that she became choked and had emesis. She has had extensive swallowing workup and will proceed with their recommendations of a dysphagia 2 diet. I do not think she has a significant bowel obstruction towards surgical intervention. She is at high risk for surgical intervention.  LOS: 6 days     Aviva Signs 04/06/2017

## 2017-04-06 NOTE — Progress Notes (Signed)
Patient and family are adamantly refusing NGT at this time. RN explained abdominal xray results and Midlevel provider's recommendations and orders to the family and the patient and they remain steadfast in not wanting NG tube to be placed. After explaining risks and benefits they all agree that should the patient begin to vomit again an NGT would be placed at that time. Patient tolerated milk and molasses enema well, produced two small bm's.

## 2017-04-06 NOTE — Progress Notes (Signed)
PROGRESS NOTE  Andrea [name] HXT:[id number] DOB: [birth year] DOA: 03/30/2017 PCP: Unk Pinto, MD Brief History: 83 year old female with a history of bronchiectasis/COPD, chronic respiratory failure on 2 L at night, hypertension, hyperlipidemia, diastolic CHF, hypothyroidism, and PATpresented with a one to two-day history of abdominal pain with associated nausea and vomiting. The patient denied any fevers, chills, chest pain, shortness breath, diarrhea, hematochezia, melena. CT of the abdomen and pelvis at the time of admission showed dilated small bowel loops up to 4.6 cm in the mid pelvis with a transition zone in the right pelvis. NG tube was inserted for decompression and Gen. surgery was consulted to assist with management. Overall, her bowel function has improved, and her NG tube was discontinued. Her diet was advanced which she tolerated. Her hospitalization has been complicated by atrial tachycardia/SVT, acute on chronic diastolic CHF, and aspiration pneumonia.  Assessment/Plan: Small bowel obstruction- Likely due to adhesions. Was resolving. Had an episode of vomiting 7/10. NG tube not placed, due to recurrence of vomiting during insertion. No Vomiting this a.m. - Gen. Surgery recs appreciated-swallowing difficulties likely cause of emesis. It was reported that she became choked and had emesis. Dont think she has a significant bowel obstruction towards surgical intervention. She is at high risk for surgical intervention - 3 bowel movements overnight, after enema -04/05/17--continue with dysphagia 2 diet with thin liquids, following speech therapy evaluation -7/10 AXR--unchanged gaseous distention of loops of small bowel - PT eval, 4 DC recommendations, patient stays at home.  SVT/Atrial tachycardia -04/02/17 evening--developed Afib RVR with HR 150-160s -spontaneously converted to sinus am 04/04/17 -Currently on IV Lopressor and diltiazem drip  - If tolerating already today  without vomiting switch to by mouth -TSH--0.936 -04/22/2016 echo EF 60-65%, grade 2 DD, no WMA, PASP 55 -04/03/17 echo--EF 60-65%, no WMA, PASP 42  Aspiration pneumonia -due to vomiting from SBO -initially NG was inserted into trachea -Continue Unasyn  Acute on Chronic diastolic CHF -Daily weights--NEG 5 lbs -04/04/17 CXR--bilateral pleural effusions unchanged, improving vascular congestion -Hold IV Lasix, 40 mg twice a day with mild increase in creatinine - 0.9 >> 1.14. - can Resume home dose Lasix a.m. with improving creatinine -Saline lockIV fluids -04/03/17 echo--EF 60-65%, no WMA, PASP 42  COPD/bronchiectasis/chronic respiratory failure with hypoxia- stable -On 2 L nasal cannula and nighttime at home -Presently stable on 2L - Continue duonebs  Hypokalemia/Hypomagnesemia -repleted -recheck mag--1.9  Essential hypertension -Holding bisoprolol/HCTZ initially due to SBO -Holding amlodipine and losartan initially due to SBO -hydralazine IV prn SBP >180  Anxiety -IV ativan q 8 hrs prn until able to take po xanax  Hypothyroidism -Continue Synthroid IV    Disposition Plan: Likely home  7/12. If tolerating by mouth without vomiting, switch IV medications to by mouth. Patient in ICU due to Cardizem drip, and prior A. fib with RVR Family Communication: Daughtersupdatedat bedside 7/10--Total time spent 35 minutes. Greater than 50% spent face to face counseling and coordinating care.  Consultants: General surgery  Code Status: FULL   DVT Prophylaxis: [place] Lovenox   Procedures: As Listed in Progress Note Above  Antibiotics: Unasyn 04/03/17>>>  Subjective: No complaints this a.m.. Vomiting last night had bowel movements 2-3 yesterday after enema. Feels better this morning no abdominal pain no bloating   Objective: Vitals:   04/06/17 0732 04/06/17 0741 04/06/17 0800 04/06/17 0900  BP:   (!) 124/49 (!) 122/50  Pulse:  79    Resp:  (!) 21  16 19    Temp:  98.1 F (36.7 C)    TempSrc:  Oral    SpO2: 98% 98%    Weight:      Height:        Intake/Output Summary (Last 24 hours) at 04/06/17 1044 Last data filed at 04/06/17 0600  Gross per 24 hour  Intake            264.8 ml  Output                0 ml  Net            264.8 ml   Weight change: 0 kg (0 lb) Exam:   General:  Pt is alert, follows commands appropriately, not in acute distress  HEENT: No icterus, No thrush, No neck mass, [place]/AT  Cardiovascular: RRR, S1/S2, no rubs, no gallops  Respiratory: no wheeze, normal work of breathing on 2 L O2  Abdomen: Soft/+BS, non tender, non distended, no guarding  Extremities: Trace LE edema, appears chronic  Data Reviewed: I have personally reviewed following labs and imaging studies Basic Metabolic Panel:  Recent Labs Lab 04/01/17 0548 04/02/17 0605 04/03/17 0320 04/04/17 0502 04/05/17 0359 04/06/17 0448  NA 140 145 144 147* 145 145  K 3.8 3.3* 3.5 3.0* 3.5 3.6  CL 100* 105 101 98* 98* 97*  CO2 30 32 29 40* 40* 34*  GLUCOSE 76 77 122* 151* 123* 146*  BUN 34* 25* 26* 24* 25* 33*  CREATININE 1.08* 0.81 0.98 0.93 0.98 1.14*  CALCIUM 8.3* 8.4* 8.5* 8.4* 8.9 8.9  MG 1.8 1.7  --  1.4* 1.9  --    Liver Function Tests:  Recent Labs Lab 03/30/17 1850 03/31/17 0630  AST 30 19  ALT 12* 9*  ALKPHOS 75 59  BILITOT 1.0 1.0  PROT 8.3* 6.4*  ALBUMIN 4.2 3.3*    Recent Labs Lab 03/30/17 1850  LIPASE 47    Recent Labs Lab 04/02/17 0605 04/03/17 0320 04/04/17 0502 04/05/17 0359 04/06/17 0448  WBC 14.2* 15.6* 16.4* 10.4 8.5  NEUTROABS  --   --   --  8.8*  --   HGB 11.6* 12.1 11.8* 12.3 12.4  HCT 38.0 39.0 38.2 39.7 40.7  MCV 96.9 96.8 97.0 97.1 99.3  PLT 170 202 192 190 218   Cardiac Enzymes: No results for input(s): CKTOTAL, CKMB, CKMBINDEX, TROPONINI in the last 168 hours. BNP: Invalid input(s): POCBNP CBG:  Recent Labs Lab 04/04/17 0815  GLUCAP 128*   HbA1C: No results for input(s): HGBA1C  in the last 72 hours. Urine analysis:    Component Value Date/Time   COLORURINE YELLOW 03/30/2017 2130   APPEARANCEUR CLEAR 03/30/2017 2130   LABSPEC 1.015 03/30/2017 2130   PHURINE 7.0 03/30/2017 2130   GLUCOSEU NEGATIVE 03/30/2017 2130   HGBUR NEGATIVE 03/30/2017 2130   [place] NEGATIVE 03/30/2017 2130   KETONESUR 5 (A) 03/30/2017 2130   PROTEINUR 30 (A) 03/30/2017 2130   UROBILINOGEN 0.2 08/20/2014 1613   NITRITE NEGATIVE 03/30/2017 2130   LEUKOCYTESUR NEGATIVE 03/30/2017 2130   Sepsis Labs: _0 (procalcitonin:4,lacticidven:4) ) Recent Results (from the past 240 hour(s))  MRSA PCR Screening     Status: None   Collection Time: 04/03/17 10:39 AM  Result Value Ref Range Status   MRSA by PCR NEGATIVE NEGATIVE Final    Comment:        The GeneXpert MRSA Assay (FDA approved for NASAL specimens only), is one component of a comprehensive MRSA colonization surveillance program.  It is not intended to diagnose MRSA infection nor to guide or monitor treatment for MRSA infections.      Scheduled Meds: . bisacodyl  10 mg Rectal q morning - 10a  . enoxaparin (LOVENOX) injection  30 mg Subcutaneous Q24H  . ipratropium-albuterol  3 mL Nebulization Q8H  . levothyroxine  25 mcg Intravenous Daily  . mouth rinse  15 mL Mouth Rinse BID  . metoprolol tartrate  5 mg Intravenous Q6H  . pantoprazole (PROTONIX) IV  40 mg Intravenous Q24H  . senna-docusate  1 tablet Oral QHS  . simethicone  80 mg Oral BID   Continuous Infusions: . ampicillin-sulbactam (UNASYN) IV Stopped (04/06/17 0340)  . diltiazem (CARDIZEM) infusion 5 mg/hr (04/05/17 2110)    Procedures/Studies: Dg Abd 1 View  Result Date: 04/01/2017 CLINICAL DATA:  NG tube position. EXAM: ABDOMEN - 1 VIEW COMPARISON:  04/01/2017 FINDINGS: NG tube tip is in the mid stomach with the side port in the proximal stomach. IMPRESSION: NG tube tip in the mid stomach. Electronically Signed   By: Rolm Baptise M.D.   On: 04/01/2017  11:49   Ct Abdomen Pelvis W Contrast  Result Date: 03/30/2017 CLINICAL DATA:  Abdominal pain with bloating.  Nausea and vomiting. EXAM: CT ABDOMEN AND PELVIS WITH CONTRAST TECHNIQUE: Multidetector CT imaging of the abdomen and pelvis was performed using the standard protocol following bolus administration of intravenous contrast. CONTRAST:  20m ISOVUE-300 IOPAMIDOL (ISOVUE-300) INJECTION 61%, 1024mISOVUE-300 IOPAMIDOL (ISOVUE-300) INJECTION 61% COMPARISON:  08/15/2007 FINDINGS: Lower chest: Tubular branching structures posterior lung bases suggest impacted airways but difficult to assess given the substantial breathing motion artifact. Hepatobiliary: 7 mm hypervascular lesion subcapsular left liver cannot be further characterized. Mild intrahepatic biliary duct prominence. Gallbladder unremarkable. Extrahepatic common bile duct measures 8 mm diameter, increased in the interval. Pancreas: No focal mass lesion. No dilatation of the main duct. No intraparenchymal cyst. No peripancreatic edema. Spleen: No splenomegaly. No focal mass lesion. Adrenals/Urinary Tract: No adrenal nodule or mass. Cortical scarring noted both kidneys with small renal cysts noted bilaterally. No evidence for hydroureter. Bladder is distended. Stomach/Bowel: Stomach is mildly distended. Duodenum unremarkable. Small bowel loops in the lower abdomen and pelvis are fluid-filled and dilated. A prominently dilated loop in the mid pelvis measures up to 4.6 cm diameter. This dilated loop pin tracts down to the pelvis where a discrete transition point is identified in the right pelvis (see image 53 series 2 and also visible on sagittal image 52 of series 6). I cannot identify dilated small bowel beyond this transition zone to suggest a closed loop etiology. The terminal ileum is collapsed. The appendix is not visualized, but there is no edema or inflammation in the region of the cecum. Diverticuli are seen scattered along the entire length of the  colon without CT findings of diverticulitis. Vascular/Lymphatic: There is abdominal aortic atherosclerosis without aneurysm. There is no gastrohepatic or hepatoduodenal ligament lymphadenopathy. No intraperitoneal or retroperitoneal lymphadenopathy. No pelvic sidewall lymphadenopathy. Reproductive: Uterus is surgically absent. There is no adnexal mass. Other: Free fluid is identified around the liver and spleen. Interloop mesenteric fluid is identified in the central small bowel mesentery. Musculoskeletal: Bone windows reveal no worrisome lytic or sclerotic osseous lesions. IMPRESSION: 1. Mechanical small bowel obstruction with small bowel loops dilated up to 4.6 cm diameter and a definite transition zone identified in the right pelvis. Although there is some question of twisting in the region of the transition zone, there is no second transition zone identified nor  dilated small bowel after the transition zone to suggest a closed loop obstruction. As such, imaging features likely related to an adhesion given the history of hysterectomy. There is fluid around the liver and spleen with interloop mesenteric fluid associated with the abnormal small bowel loops. No small bowel wall thickening or pneumatosis at this time to suggest overt bowel ischemia. 2. Probable airway impaction in both posterior lower lobes although not well assessed given motion artifact. Electronically Signed   By: Misty Stanley M.D.   On: 03/30/2017 21:51   Dg Chest Port 1 View  Result Date: 04/04/2017 CLINICAL DATA:  Acute on chronic diastolic heart failure. EXAM: PORTABLE CHEST 1 VIEW COMPARISON:  Radiograph 04/02/2017, 03/30/2017 FINDINGS: Stable heart size and mediastinal contours. Stable hazy opacity at both lung bases likely comminution of pleural fluid and atelectasis. Pulmonary edema with slight improvement from prior exam. Biapical pleuroparenchymal scarring again seen. No pneumothorax or new confluent airspace disease. Chronic change  in the right shoulder. IMPRESSION: Slight improvement in congestive heart failure with mild decrease in pulmonary edema. Bilateral pleural effusions are unchanged. Electronically Signed   By: Jeb Levering M.D.   On: 04/04/2017 16:01   Dg Chest Port 1 View  Result Date: 04/02/2017 CLINICAL DATA:  Cough. EXAM: PORTABLE CHEST 1 VIEW COMPARISON:  03/30/2017 FINDINGS: 2 frontal radiographs. Nasogastric tube extends beyond the inferior aspect of the film. Patient rotated to the right. Midline trachea. Normal heart size. Layering bilateral pleural effusions are new or significantly increased. Greater on the right. No pneumothorax. progression of moderate interstitial edema. Development of bibasilar airspace disease. IMPRESSION: Worsened aeration, with new or increased moderate congestive heart failure. Development of bilateral pleural effusions and adjacent airspace disease, most likely atelectasis. Infection or aspiration cannot be excluded. Electronically Signed   By: Abigail Miyamoto M.D.   On: 04/02/2017 11:14   Dg Chest Portable 1 View  Result Date: 03/30/2017 CLINICAL DATA:  Nasogastric tube placement. Generalized abdominal pain and bloating, acute onset. Initial encounter. EXAM: PORTABLE CHEST 1 VIEW COMPARISON:  Chest radiograph performed 06/10/2016 FINDINGS: The patient's enteric tube is seen extending below the diaphragm. The lungs are hyperexpanded, with biapical scarring. Vascular congestion is noted, with increased interstitial markings. Mild interstitial edema cannot be excluded. No pleural effusion or pneumothorax is seen. The cardiomediastinal silhouette is borderline normal in size. No acute osseous abnormalities are seen. Mild degenerative change is noted at the glenohumeral joints bilaterally. IMPRESSION: 1. Enteric tube noted extending below the diaphragm. 2. Lungs hyperexpanded, with biapical scarring, likely reflecting COPD. Underlying vascular congestion, with increased interstitial markings.  Mild interstitial edema cannot be excluded. Electronically Signed   By: Garald Balding M.D.   On: 03/30/2017 23:55   Dg Abd 2 Views  Result Date: 04/05/2017 CLINICAL DATA:  Vomiting with nausea EXAM: ABDOMEN - 2 VIEW COMPARISON:  04/03/2017 FINDINGS: Small bilateral effusions with dense left lower lobe consolidation and patchy infiltrate at the right base. No definite free air beneath the diaphragm. Some contrast present in the colon. Contrast opacifies dilated small bowel in the lower abdomen and pelvis, measuring up to 4 cm. IMPRESSION: Dilated loops of small bowel containing contrast within the lower abdomen and pelvis consistent with bowel obstruction. There is a small amount of contrast present in the colon. Small bilateral effusions and left greater than right bibasilar infiltrates Electronically Signed   By: Donavan Foil M.D.   On: 04/05/2017 21:07   Dg Abd 2 Views  Result Date: 04/03/2017 CLINICAL DATA:  Patient with  history of small bowel obstruction. EXAM: ABDOMEN - 2 VIEW COMPARISON:  Abdominal radiograph 04/01/2017 FINDINGS: Enteric tube tip and side-port project over the stomach. Oral contrast material within the colon. Stable gaseous distended loops of small bowel within the central abdomen. Heterogeneous opacities lung bases bilaterally. Small bilateral pleural effusions. IMPRESSION: Enteric tube tip and side-port project over the stomach. Unchanged gaseous distended loops of bowel within the central abdomen. Layering effusions and underlying opacities. Electronically Signed   By: Lovey Newcomer M.D.   On: 04/03/2017 09:13   Dg Abd 2 Views  Result Date: 04/01/2017 CLINICAL DATA:  Followup small bowel obstruction. EXAM: ABDOMEN - 2 VIEW COMPARISON:  03/31/2017 FINDINGS: There are persistent loops of dilated central small bowel with air-fluid levels consistent with a partial small bowel obstruction, similar to the prior study. There is no free air. Nasogastric tube extends below the diaphragm  into the mid stomach. IMPRESSION: 1. Persistent partial small bowel obstruction. No significant change from the prior study. No free air. Electronically Signed   By: Lajean Manes M.D.   On: 04/01/2017 08:42   Dg Abd 2 Views  Result Date: 03/31/2017 CLINICAL DATA:  Follow-up small bowel obstruction. EXAM: ABDOMEN - 2 VIEW COMPARISON:  03/30/2017 abdominal CT FINDINGS: An NG tube is identified with tip overlying the proximal-mid stomach. Dilated small bowel loops are again noted and not significantly changed. There is no evidence of pneumoperitoneum. No other changes identified. IMPRESSION: Little significant change of dilated small bowel loops compatible with small bowel obstruction. No evidence of pneumoperitoneum. NG tube with tip overlying the proximal-mid stomach. Electronically Signed   By: Margarette Canada M.D.   On: 03/31/2017 21:12   Dg Swallowing Func-speech Pathology  Result Date: 04/05/2017 Objective Swallowing Evaluation: Type of Study: MBS-Modified Barium Swallow Study Patient Details Name: Andrea [name] MRN: [id number] Date of Birth: July 06, 1929 Today's Date: 04/05/2017 Time: SLP Start Time (ACUTE ONLY): 0805-SLP Stop Time (ACUTE ONLY): 0819 SLP Time Calculation (min) (ACUTE ONLY): 14 min Past Medical History: Past Medical History: Diagnosis Date . Aortic regurgitation  . Bronchiectasis ([place])  . COPD (chronic obstructive pulmonary disease) ([place])  . DJD (degenerative joint disease)  . HLD (hyperlipidemia)  . HTN (hypertension)  . Mild mitral regurgitation by prior echocardiogram  . Mild tricuspid regurgitation  . Palpitations   a. has known history of PAC's and PVC's. b. 09/2015: monitor showed sinus rhythm with occasional PAC's and a brief episode of PAT. [name] Thyroid disease  Past Surgical History: Past Surgical History: Procedure Laterality Date . ABDOMINAL HYSTERECTOMY  1993 . CATARACT EXTRACTION, BILATERAL   HPI: 83 year old female with a history of bronchiectasis/COPD, chronic respiratory failure on 2  L at night, hypertension, hyperlipidemia, diastolic CHF, hypothyroidism, and PATpresented with a one to two-day history of abdominal pain with associated nausea and vomiting. The patient denied any fevers, chills, chest pain, shortness breath, diarrhea, hematochezia, melena. CT of the abdomen and pelvis at the time of admission showed dilated small bowel loops up to 4.6 cm in the mid pelvis with a transition zone in the right pelvis. NG tube was inserted for decompression and Gen. surgery was consulted to assist with management. Overall, her bowel function has improved, and her NG tube was discontinued 7/8. Her diet was advanced which she tolerated. Her hospitalization has been Located by atrial tachycardia/atrial fibrillation, acute on chronic diastolic CHF, and aspiration pneumonia. Pt and family report continued swallowing difficulty. ST will evaluate and treat as indicated. No Data Recorded Assessment / Plan /  Recommendation CHL IP CLINICAL IMPRESSIONS 04/05/2017 Clinical Impression Pt presents with an inconsistent mild to moderate oropharyngeal dysphagia. Oral phase characterized by decreased bolus cohesion of solids and delayed oral transit. Swallow initiation delayed throughout PO trials, to the level of the valleculae with puree and solids, and to the level of the pyriform sinuses with thin and nectar thick liquids. Prominent vallecular residuals evidenced secondary to reduced tongue base retraction, which were increased with solids and thickened consistencies. Thin liquid alternation aided in clearing vallecular residuals.  Inconsistent penetration occured with thin liquids before the swallow that intermittently reached the level of the vocal cords and was cleared out of laryngeal vestibule intermittently. Volitional throat clear aided in expelling penetrates with cueing from SLP. Nectar thick liquids without penetration. As PO trials progressed pt did evidence cup and straw sips of thin liquids without  penetration. Barium tablet administered with thin liquids. Pt displayed difficulty propelling tablet throughout pharynx, initially sticking in the valleculae, then the pyriform sinuses, ultimately requiring nectar thick liquids to clear through pharynx. As pt was struggling to pass pill throughout pharynx, silent aspiration of thin liquids occured after the swallow. Recommend chopped consistencies with thin liquids in isolation. Pt advised to avoid mixed consistencies as airway protection appears compromised when two consistencies administered at the same time. Recommend medicines whole in puree (crush larger meds as needed). ST to follow up for diet tolerance and education with family for safe swallow strategies  SLP Visit Diagnosis Dysphagia, oropharyngeal phase (R13.12) Attention and concentration deficit following -- Frontal lobe and executive function deficit following -- Impact on safety and function Moderate aspiration risk   CHL IP TREATMENT RECOMMENDATION 04/05/2017 Treatment Recommendations Therapy as outlined in treatment plan below   Prognosis 04/05/2017 Prognosis for Safe Diet Advancement Good Barriers to Reach Goals -- Barriers/Prognosis Comment -- CHL IP DIET RECOMMENDATION 04/05/2017 SLP Diet Recommendations Dysphagia 2 (Fine chop) solids;Thin liquid;No mixed consistencies Liquid Administration via -- Medication Administration Whole meds with puree Compensations Minimize environmental distractions;Slow rate;Small sips/bites;Multiple dry swallows after each bite/sip;Follow solids with liquid;Clear throat intermittently Postural Changes Remain semi-upright after after feeds/meals (Comment);Seated upright at 90 degrees   CHL IP OTHER RECOMMENDATIONS 04/05/2017 Recommended Consults -- Oral Care Recommendations Oral care BID Other Recommendations --   CHL IP FOLLOW UP RECOMMENDATIONS 04/05/2017 Follow up Recommendations 24 hour supervision/assistance   CHL IP FREQUENCY AND DURATION 04/05/2017 Speech Therapy  Frequency (ACUTE ONLY) min 2x/week Treatment Duration 2 weeks      CHL IP ORAL PHASE 04/05/2017 Oral Phase Impaired Oral - Pudding Teaspoon -- Oral - Pudding Cup -- Oral - Honey Teaspoon -- Oral - Honey Cup -- Oral - Nectar Teaspoon Delayed oral transit;Lingual/palatal residue Oral - Nectar Cup Delayed oral transit;Lingual/palatal residue;Weak lingual manipulation Oral - Nectar Straw Weak lingual manipulation;Delayed oral transit;Lingual/palatal residue Oral - Thin Teaspoon Weak lingual manipulation;Delayed oral transit;Lingual/palatal residue Oral - Thin Cup Weak lingual manipulation;Delayed oral transit;Lingual/palatal residue Oral - Thin Straw Delayed oral transit;Weak lingual manipulation;Lingual/palatal residue Oral - Puree Delayed oral transit;Weak lingual manipulation;Lingual pumping Oral - Mech Soft -- Oral - Regular Lingual/palatal residue;Piecemeal swallowing;Delayed oral transit;Decreased bolus cohesion;Weak lingual manipulation Oral - Multi-Consistency -- Oral - Pill Lingual pumping;Reduced posterior propulsion;Delayed oral transit;Piecemeal swallowing;Lingual/palatal residue Oral Phase - Comment --  CHL IP PHARYNGEAL PHASE 04/05/2017 Pharyngeal Phase Impaired Pharyngeal- Pudding Teaspoon -- Pharyngeal -- Pharyngeal- Pudding Cup -- Pharyngeal -- Pharyngeal- Honey Teaspoon -- Pharyngeal -- Pharyngeal- Honey Cup -- Pharyngeal -- Pharyngeal- Nectar Teaspoon Reduced tongue base retraction;Delayed swallow initiation-vallecula;Delayed swallow initiation-pyriform  sinuses;Pharyngeal residue - valleculae Pharyngeal -- Pharyngeal- Nectar Cup Delayed swallow initiation-vallecula;Delayed swallow initiation-pyriform sinuses;Reduced tongue base retraction;Pharyngeal residue - valleculae Pharyngeal -- Pharyngeal- Nectar Straw -- Pharyngeal -- Pharyngeal- Thin Teaspoon Delayed swallow initiation-vallecula;Penetration/Aspiration during swallow;Penetration/Aspiration before swallow;Pharyngeal residue - valleculae;Reduced  tongue base retraction;Reduced epiglottic inversion Pharyngeal Material enters airway, CONTACTS cords and then ejected out Pharyngeal- Thin Cup Delayed swallow initiation-vallecula;Reduced tongue base retraction;Pharyngeal residue - valleculae;Penetration/Aspiration during swallow;Penetration/Aspiration before swallow Pharyngeal Material enters airway, CONTACTS cords and not ejected out Pharyngeal- Thin Straw Penetration/Aspiration during swallow;Penetration/Aspiration before swallow;Delayed swallow initiation-vallecula;Reduced tongue base retraction;Pharyngeal residue - valleculae Pharyngeal Material enters airway, remains ABOVE vocal cords and not ejected out Pharyngeal- Puree Delayed swallow initiation-vallecula;Pharyngeal residue - valleculae;Reduced tongue base retraction Pharyngeal -- Pharyngeal- Mechanical Soft -- Pharyngeal -- Pharyngeal- Regular Delayed swallow initiation-vallecula;Pharyngeal residue - valleculae;Reduced tongue base retraction Pharyngeal -- Pharyngeal- Multi-consistency -- Pharyngeal -- Pharyngeal- Pill Delayed swallow initiation-vallecula;Other (Comment);Penetration/Apiration after swallow Pharyngeal Material enters airway, passes BELOW cords and not ejected out despite cough attempt by patient Pharyngeal Comment --  No flowsheet data found. Arvil Chaco MA, CCC-SLP Acute Care Speech Language Pathologist  Levi Aland 04/05/2017, 9:48 AM               Bethena Roys, MD [facility] Pager [phone]  If 7PM-7AM, please contact night-coverage [email or web address] Password [email or web address] 04/06/2017, 10:44 AM   LOS: 6 days

## 2017-04-06 NOTE — Evaluation (Signed)
Physical Therapy Evaluation Patient Details Name: Andrea [name] MRN: [id number] DOB: 09/05/1929 Today's Date: 04/06/2017   History of Present Illness  Andrea [name] is an 83yo white female who comes to [facility] on 7/4 p 2d ABD pain, found to have SBO. SBO is slowly resolving, being followed by surgery, and she is suspected to now have aspiration PNA followed by SLP. At baseline, pt was a community dwelling adult, fully independnent s AD.  PMH: COPD, DCHF, mild MR/TR, HTN, HLD. She was moved to step-down due to some tachycardia/SVT. At baseline she is on 2LPM overnight only.   Clinical Impression  Pt admitted with above diagnosis. Pt currently with functional limitations due to the deficits listed below (see "PT Problem List"). Upon entry, the patient is received semirecumbent in bed, 2 daughters present. The pt is awake and agreeable to participate. No acute distress noted at this time, pt only reporting some thirst. VSS during eval, however, pt requires 2LPM O2 for saturation >89%, whereas she dons O2 nocturnally at baseline. The pt is alert and oriented x3, pleasant, conversational, and following simple and multi-step commands consistently. Pt received on 2LPM O2, trialed on room air as per baseline, with desaturation to 86%, then 2LPM throughout evaluation, with noted saturation of >89%. Functional mobility assessment demonstrates moderate weakness, the pt now requiring maximal effort with demonstrated brady kinesia to perform bed mobility (with modified independence), as well as for transfers and AMB, which also require minA physical assistance for retropulsion/posterior lean. AMB is limited to 9f due to fatigue in legs. Pt not tolerating high-level balance assessment at this time, but never able to fully self correct retropulsion during OOB mobility, physical assistance required to avoid falling. Pt will benefit from skilled PT intervention to increase independence and safety with basic mobility in  preparation for discharge to the venue listed below.       Follow Up Recommendations SNF    Equipment Recommendations  Rolling walker with 5" wheels    Recommendations for Other Services       Precautions / Restrictions Precautions Precautions: Fall      Mobility  Bed Mobility Overal bed mobility: Modified Independent                Transfers Overall transfer level: Needs assistance Equipment used: 1 person hand held assist Transfers: Sit to/from Stand Sit to Stand: Min guard            Ambulation/Gait Ambulation/Gait assistance: Min assist Ambulation Distance (Feet): 20 Feet Assistive device: 1 person hand held assist Gait Pattern/deviations: Leaning posteriorly;Step-to pattern;Decreased step length - right;Decreased step length - left   Gait velocity interpretation: <1.8 ft/sec, indicative of risk for recurrent falls General Gait Details: very weak, slow, step-to gait.  Stairs            Wheelchair Mobility    Modified Rankin (Stroke Patients Only)       Balance Overall balance assessment: History of Falls;Needs assistance   Sitting balance-Leahy Scale: Good   Postural control: Posterior lean (in stance/during gait ) Standing balance support: Bilateral upper extremity supported;During functional activity Standing balance-Leahy Scale: Poor Standing balance comment: posterior lean              High level balance activites: Backward walking               Pertinent Vitals/Pain Pain Assessment: No/denies pain    Home Living Family/patient expects to be discharged to:: Private residence Living Arrangements: Spouse/significant other Available Help at  Discharge: Family Type of Home: House Home Access: Stairs to enter Entrance Stairs-Rails: None Entrance Stairs-Number of Steps: 3 Home Layout: One level Home Equipment: [place] - 2 wheels;Cane - single point Additional Comments: does not use DME at [facility]    Prior Function  Level of Independence: Independent         Comments: stopped driving in June 6295 due to eye exam      Hand Dominance        Extremity/Trunk Assessment   Upper Extremity Assessment Upper Extremity Assessment: Generalized weakness;Overall [facility] for tasks assessed    Lower Extremity Assessment Lower Extremity Assessment: Generalized weakness       Communication   Communication: No difficulties  Cognition Arousal/Alertness: Awake/alert Behavior During Therapy: WFL for tasks assessed/performed Overall Cognitive Status: Within Functional Limits for tasks assessed                                        General Comments      Exercises     Assessment/Plan    PT Assessment Patient needs continued PT services  PT Problem List         PT Treatment Interventions DME instruction;Functional mobility training;Therapeutic activities;Therapeutic exercise;Balance training;Patient/family education    PT Goals (Current goals can be found in the Care Plan section)  Acute Rehab PT Goals Patient Stated Goal: regain strength and independence  PT Goal Formulation: With patient Time For Goal Achievement: 04/20/17 Potential to Achieve Goals: Good    Frequency Min 2X/week   Barriers to discharge        Co-evaluation               AM-PAC PT "6 Clicks" Daily Activity  Outcome Measure Difficulty turning over in bed (including adjusting bedclothes, sheets and blankets)?: A Little Difficulty moving from lying on back to sitting on the side of the bed? : A Little Difficulty sitting down on and standing up from a chair with arms (e.g., wheelchair, bedside commode, etc,.)?: A Lot Help needed moving to and from a bed to chair (including a wheelchair)?: A Lot Help needed walking in hospital room?: A Lot Help needed climbing 3-5 steps with a railing? : Total 6 Click Score: 13    End of Session Equipment Utilized During Treatment: Gait belt;Oxygen Activity  Tolerance: Patient tolerated treatment well;Patient limited by fatigue Patient left: in chair;with call bell/phone within reach;with nursing/sitter in room;with family/visitor present Nurse Communication: Mobility status PT Visit Diagnosis: Unsteadiness on feet (R26.81);Difficulty in walking, not elsewhere classified (R26.2);Muscle weakness (generalized) (M62.81)    Time: 2841-3244 PT Time Calculation (min) (ACUTE ONLY): 23 min   Charges:   PT Evaluation $PT Eval Moderate Complexity: 1 Procedure     PT G Codes:      12:22 PM, 2017/05/04 Etta Grandchild, PT, DPT Physical Therapist - [place] [phone] (Office)    12:22 PM, 05-04-17 Etta Grandchild, PT, DPT Physical Therapist - [place] [phone] (Office)   Buccola,Allan C 2017-05-04, 12:11 PM

## 2017-04-07 ENCOUNTER — Inpatient Hospital Stay (HOSPITAL_COMMUNITY): Payer: Medicare Other

## 2017-04-07 LAB — BASIC METABOLIC PANEL
ANION GAP: 8 (ref 5–15)
BUN: 34 mg/dL — ABNORMAL HIGH (ref 6–20)
CHLORIDE: 96 mmol/L — AB (ref 101–111)
CO2: 41 mmol/L — AB (ref 22–32)
CREATININE: 1.04 mg/dL — AB (ref 0.44–1.00)
Calcium: 8.7 mg/dL — ABNORMAL LOW (ref 8.9–10.3)
GFR calc non Af Amer: 47 mL/min — ABNORMAL LOW (ref >60)
GFR, EST AFRICAN AMERICAN: 55 mL/min — AB (ref >60)
Glucose, Bld: 115 mg/dL — ABNORMAL HIGH (ref 65–99)
Potassium: 3.4 mmol/L — ABNORMAL LOW (ref 3.5–5.1)
Sodium: 145 mmol/L (ref 135–145)

## 2017-04-07 MED ORDER — POTASSIUM CHLORIDE 20 MEQ/15ML (10%) PO SOLN
40.0000 meq | Freq: Once | ORAL | Status: AC
  Administered 2017-04-07: 40 meq via ORAL
  Filled 2017-04-07: qty 30

## 2017-04-07 MED ORDER — FUROSEMIDE 20 MG PO TABS
20.0000 mg | ORAL_TABLET | Freq: Every day | ORAL | Status: DC
  Administered 2017-04-07 – 2017-04-11 (×5): 20 mg via ORAL
  Filled 2017-04-07 (×5): qty 1

## 2017-04-07 MED ORDER — DILTIAZEM HCL 60 MG PO TABS
60.0000 mg | ORAL_TABLET | Freq: Four times a day (QID) | ORAL | Status: DC
  Administered 2017-04-07 – 2017-04-10 (×12): 60 mg via ORAL
  Filled 2017-04-07 (×12): qty 1

## 2017-04-07 MED ORDER — ALPRAZOLAM 0.25 MG PO TABS
0.2500 mg | ORAL_TABLET | Freq: Three times a day (TID) | ORAL | Status: DC | PRN
  Administered 2017-04-08 – 2017-04-12 (×4): 0.25 mg via ORAL
  Filled 2017-04-07 (×4): qty 1

## 2017-04-07 MED ORDER — LEVOTHYROXINE SODIUM 50 MCG PO TABS
50.0000 ug | ORAL_TABLET | Freq: Every day | ORAL | Status: DC
  Administered 2017-04-08: 50 ug via ORAL
  Filled 2017-04-07: qty 1

## 2017-04-07 MED ORDER — DILTIAZEM HCL 60 MG PO TABS: 60.0000 mg | ORAL_TABLET | Freq: Four times a day (QID) | ORAL | Status: DC

## 2017-04-07 NOTE — Progress Notes (Signed)
  Speech Language Pathology Treatment: Dysphagia  Patient Details Name: Andrea [name] MRN: [id number] DOB: 16-Jun-1929 Today's Date: 04/07/2017 Time: 1001-1017 SLP Time Calculation (min) (ACUTE ONLY): 16 min  Assessment / Plan / Recommendation Clinical Impression  Skilled treatment with consumption of thin liquids via cup utilizing small sips and effortful swallow with minimal verbal cues provided by SLP; provided lengthy education to family re: esophageal symptoms, results of MBSS and aging swallow (presbyphagia); pt refused any solids despite encouragement d/t increased bloating and decreased appetite; no overt s/s of aspiration noted with small sips of thin liquids, but discussed s/s of aspiration d/t hx of GERD, recent dx of oropharyngeal dysphagia with Dysphagia 2/thin diet recommended.  Family indicated diet change to increase PO intake; ST will continue to f/u while in acute setting for diet tolerance/education for safety with swallowing.   HPI HPI: 83 y.o. female, With history of bronchiectasis, hypothyroidism, hypertension, hyperlipidemia who came to hospital with complaints of abdominal pain which started yesterday. Patient also has been having retching and nausea, but vomited after she came to ED. Patient says that the pain is intermittent, colicky in nature. No aggravating or triggering factors.      SLP Plan  Continue with current plan of care       Recommendations  Diet recommendations: Dysphagia 3 (mechanical soft);Thin liquid Liquids provided via: Cup Medication Administration: Whole meds with puree Supervision: Patient able to self feed;Intermittent supervision to cue for compensatory strategies Compensations: Slow rate;Small sips/bites;Clear throat intermittently;Effortful swallow                Oral Care Recommendations: Oral care BID Follow up Recommendations: Other (comment) (TBD) SLP Visit Diagnosis: Dysphagia, oropharyngeal phase (R13.12) Plan: Continue with  current plan of care                      Elvina Sidle, M.S., CCC-SLP 04/07/2017, 12:06 PM

## 2017-04-07 NOTE — Care Management Note (Addendum)
Case Management Note  Patient Details  Name: Andrea [name] MRN: [id number] Date of Birth: 03/07/1929   Status of Service:  In process, will continue to follow  If discussed at Long Length of Stay Meetings, dates discussed:  04/07/2017 Additional Comments: Recommended for SNF Israel Wunder, Chauncey Reading, RN 04/07/2017, 10:24 AM

## 2017-04-07 NOTE — Progress Notes (Signed)
Pt arrived to room 336 from ICU. Tele applied. No distress noted. Family at bedside.

## 2017-04-07 NOTE — NC FL2 (Signed)
[place] LEVEL OF CARE SCREENING TOOL     IDENTIFICATION  Patient Name: Andrea [name] Birthdate: 1929/02/07 Sex: female Admission Date (Current Location): 03/30/2017  [facility] and [place] Number:  [employer] and Address:  [place] [address] [place]      Provider Number: [id number]  Attending Physician Name and Address:  Bethena Roys, MD  Relative Name and Phone Number:       Current Level of Care: Hospital Recommended Level of Care: [place] Prior Approval Number:    Date Approved/Denied:   PASRR Number: [id number] ([id number] )  Discharge Plan: SNF    Current Diagnoses: Patient Active Problem List   Diagnosis Date Noted  . Aspiration pneumonitis ([place]) 04/03/2017  . Acute on chronic diastolic CHF (congestive heart failure) ([place]) 04/03/2017  . AF (paroxysmal atrial fibrillation) ([place]) 04/03/2017  . Atrial tachycardia ([place]) 04/02/2017  . Chronic respiratory failure with hypoxia ([place]) 03/31/2017  . Small bowel obstruction ([place]) 03/31/2017  . SBO (small bowel obstruction) ([place]) 03/30/2017  . Chronic diastolic CHF (congestive heart failure) ([place]) 12/27/2016  . Pulmonary hypertension due to COPD ([place]) 10/04/2016  . Bilateral pleural effusion 04/20/2016  . Hyponatremia 04/19/2016  . Renal insufficiency 04/19/2016  . Tachycardia 04/19/2016  . Skin cancer 09/25/2015  . Malnutrition ([place]) 09/25/2015  . Hypothyroidism 09/25/2015  . Osteoporosis 09/25/2015  . Aortic insufficiency 09/04/2015  . Vitamin D deficiency 01/23/2014  . Medication management 01/23/2014  . Hyperlipidemia 08/10/2013  . Prediabetes 08/10/2013  . DJD (degenerative joint disease)   . Mild mitral regurgitation by prior echocardiogram   . Essential hypertension 05/08/2010  . COPD with emphysema ([place]) 05/08/2010  . Lung nodules 05/08/2010    Orientation RESPIRATION BLADDER Height & Weight     Self, Time, Place,  Situation  O2 (2L) Incontinent Weight: 99 lb 3.3 oz (45 kg) Height:  _0  (160 cm)  BEHAVIORAL SYMPTOMS/MOOD NEUROLOGICAL BOWEL NUTRITION STATUS      Continent Diet (DYS 2)  AMBULATORY STATUS COMMUNICATION OF NEEDS Skin   Limited Assist Verbally Normal                       Personal Care Assistance Level of Assistance  Bathing, Feeding, Dressing Bathing Assistance: Limited assistance Feeding assistance: Independent Dressing Assistance: Limited assistance     Functional Limitations Info  Sight, Hearing, Speech Sight Info: Adequate Hearing Info: Adequate Speech Info: Adequate    SPECIAL CARE FACTORS FREQUENCY  PT (By licensed PT)     PT Frequency: 5x/week              Contractures Contractures Info: Not present    Additional Factors Info  Allergies, Code Status, Psychotropic Code Status Info: Full Allergies Info: Vancomycin, sulfa antibiotics,  Psychotropic Info: Xanax         Current Medications (04/07/2017):  This is the current hospital active medication list Current Facility-Administered Medications  Medication Dose Route Frequency Provider Last Rate Last Dose  . Ampicillin-Sulbactam (UNASYN) 3 g in sodium chloride 0.9 % 100 mL IVPB  3 g Intravenous Therisa Doyne, MD   Stopped at 04/07/17 [phone]  . bisacodyl (DULCOLAX) suppository 10 mg  10 mg Rectal q morning - 10a Aviva Signs, MD   10 mg at 04/07/17 0946  . diltiazem (CARDIZEM) 100 mg in dextrose 5 % 100 mL (1 mg/mL) infusion  5-15 mg/hr Intravenous Titrated Orson Eva, MD 10 mL/hr at 04/07/17 0800 10  mg/hr at 04/07/17 0800  . diltiazem (CARDIZEM) tablet 60 mg  60 mg Oral Q6H Emokpae, Ejiroghene E, MD   60 mg at 04/07/17 1136  . enoxaparin (LOVENOX) injection 30 mg  30 mg Subcutaneous Q24H Tat, David, MD   30 mg at 04/07/17 0021  . hydrALAZINE (APRESOLINE) injection 5 mg  5 mg Intravenous Q8H PRN Tat, David, MD      . ipratropium-albuterol (DUONEB) 0.5-2.5 (3) MG/3ML nebulizer solution 3 mL  3 mL  Nebulization Q6H PRN Darrick Meigs, Marge Duncans, MD      . ipratropium-albuterol (DUONEB) 0.5-2.5 (3) MG/3ML nebulizer solution 3 mL  3 mL Nebulization Q8H Orson Eva, MD   3 mL at 04/07/17 0746  . levothyroxine (SYNTHROID, LEVOTHROID) injection 25 mcg  25 mcg Intravenous Daily Tat, Shanon Brow, MD   25 mcg at 04/07/17 0947  . LORazepam (ATIVAN) injection 0.25 mg  0.25 mg Intravenous TID PRN Orson Eva, MD      . MEDLINE mouth rinse  15 mL Mouth Rinse BID Tat, David, MD   15 mL at 04/07/17 0946  . metoprolol tartrate (LOPRESSOR) injection 5 mg  5 mg Intravenous Q6H Orson Eva, MD   5 mg at 04/07/17 1142  . morphine 2 MG/ML injection 2 mg  2 mg Intravenous Q6H PRN Cristal Ford, DO   2 mg at 04/06/17 0346  . ondansetron (ZOFRAN) tablet 4 mg  4 mg Oral Q6H PRN Oswald Hillock, MD       Or  . ondansetron [facility]) injection 4 mg  4 mg Intravenous Q6H PRN Oswald Hillock, MD   4 mg at 04/06/17 2029  . pantoprazole (PROTONIX) injection 40 mg  40 mg Intravenous Q24H Orson Eva, MD   40 mg at 04/07/17 0818  . senna-docusate (Senokot-S) tablet 1 tablet  1 tablet Oral QHS Aviva Signs, MD   1 tablet at 04/06/17 2113  . simethicone (MYLICON) chewable tablet 80 mg  80 mg Oral BID Orson Eva, MD   80 mg at 04/07/17 7824     Discharge Medications: Please see discharge summary for a list of discharge medications.  Relevant Imaging Results:  Relevant Lab Results:   Additional Information [phone] Clydene Pugh, LCSW

## 2017-04-07 NOTE — Progress Notes (Signed)
PROGRESS NOTE  Andrea [name] LHT:[id number] DOB: 1929/02/08 DOA: 03/30/2017 PCP: Unk Pinto, MD Brief History: 83 year old female with a history of bronchiectasis/COPD, chronic respiratory failure on 2 L at night, hypertension, hyperlipidemia, diastolic CHF, hypothyroidism, and PATpresented with a one to two-day history of abdominal pain with associated nausea and vomiting. The patient denied any fevers, chills, chest pain, shortness breath, diarrhea, hematochezia, melena. CT of the abdomen and pelvis at the time of admission showed dilated small bowel loops up to 4.6 cm in the mid pelvis with a transition zone in the right pelvis. NG tube was inserted for decompression and Gen. surgery was consulted to assist with management. Overall, her bowel function has improved, and her NG tube was discontinued. Her diet was advanced which she tolerated. Her hospitalization has been complicated by atrial tachycardia/SVT, acute on chronic diastolic CHF, and aspiration pneumonia.  Assessment/Plan: Small bowel obstruction- Likely due to adhesions. No vomiting, having bowel movements. Abdominal bloating present. - Gen. Surgery recs appreciated- CT abdomen and pelvis a.m. to assess progress or lack there of - continue with dysphagia 2 diet with thin liquids, following speech therapy evaluation -7/10 AXR--unchanged gaseous distention of loops of small bowel - PT eval- SNF recommended - Social work consult. - Transfer to floor.  SVT/Atrial tachycardia- now rate controlled. Sinus.  -  DC the cardizem drip and IV Lopressor - Start by mouth Cardizem 60 mg every 6 hourly, consolidated dose tomorrow -TSH--0.936 -04/22/2016 echo EF 60-65%, grade 2 DD, no WMA, PASP 55 -04/03/17 echo--EF 60-65%, no WMA, PASP 42  Aspiration pneumonia-  Chest xray- opacity right base, ? Superimposed pneumonia left lower lobe. No fever, no leukocytosis. -due to vomiting from SBO -Continue Unasyn  Acute on Chronic  diastolic CHF -Daily weights--NEG 5 lbs -04/07/17 CXR-Pulmonary edema. -Hold IV Lasix, 40 mg twice a day with mild increase in creatinine - 0.9 >> 1.14. -  Resume home dose 28m  Lasix  with improving creatinine -Saline lockIV fluids -04/03/17 echo--EF 60-65%, no WMA, PASP 42, no remark on diastolic dysfunction.  COPD/bronchiectasis/chronic respiratory failure with hypoxia- stable -On 2 L nasal cannula nighttime at home -Presently stable on 2L - Continue duonebs  Hypokalemia/Hypomagnesemia -repleted -recheck mag--1.9  Essential hypertension -Holding bisoprolol/HCTZ initially due to SBO -Holding amlodipine and losartan initially due to SBO -hydralazine IV prn SBP >180 - Bp stable on new regimen of cardizem.  Anxiety -D/c IV ativan q 8 hrs prn, resume home xanax.  Hypothyroidism - D/c Iv, resume home synthroid   Disposition Plan: Pending surgery eval. Stable from medical standpoint.  Family Communication: Daughtersupdatedat bedside 7/10--Total time spent 35 minutes. Greater than 50% spent face to face counseling and coordinating care.  Consultants: General surgery  Code Status: FULL   DVT Prophylaxis: [place] Lovenox  Procedures: As Listed in Progress Note Above  Antibiotics: Unasyn 04/03/17>>>  Subjective: No complaints this a.m.. Vomiting last night had bowel movements 2-3 yesterday after enema. Feels better this morning no abdominal pain no bloating  Objective: Vitals:   04/07/17 0732 04/07/17 0746 04/07/17 0800 04/07/17 1123  BP: (!) 111/58  (!) 105/54   Pulse: (!) 108  (!) 122 (!) 103  Resp: _0 Temp: 98.2 F (36.8 C)   97.8 F (36.6 C)  TempSrc: Oral   Oral  SpO2: 100% 100% 99% 99%  Weight:      Height:        Intake/Output Summary (Last 24 hours) at  04/07/17 1223 Last data filed at 04/07/17 0800  Gross per 24 hour  Intake           749.25 ml  Output              600 ml  Net           149.25 ml   Weight change: 0.5 kg (1 lb  1.6 oz) Exam:   General:  Pt is alert, follows commands appropriately, not in acute distress  HEENT: No icterus, No thrush, No neck mass, [place]/AT  Cardiovascular: RRR, S1/S2, no rubs, no gallops  Respiratory: no wheeze, normal work of breathing on 2 L O2  Abdomen: Soft/+BS, non tender, non distended, no guarding  Extremities: Trace LE edema, appears chronic  Data Reviewed: I have personally reviewed following labs and imaging studies Basic Metabolic Panel:  Recent Labs Lab 04/01/17 0548 04/02/17 0605 04/03/17 0320 04/04/17 0502 04/05/17 0359 04/06/17 0448 04/07/17 0412  NA 140 145 144 147* 145 145 145  K 3.8 3.3* 3.5 3.0* 3.5 3.6 3.4*  CL 100* 105 101 98* 98* 97* 96*  CO2 30 32 29 40* 40* 34* 41*  GLUCOSE 76 77 122* 151* 123* 146* 115*  BUN 34* 25* 26* 24* 25* 33* 34*  CREATININE 1.08* 0.81 0.98 0.93 0.98 1.14* 1.04*  CALCIUM 8.3* 8.4* 8.5* 8.4* 8.9 8.9 8.7*  MG 1.8 1.7  --  1.4* 1.9  --   --     Recent Labs Lab 04/02/17 0605 04/03/17 0320 04/04/17 0502 04/05/17 0359 04/06/17 0448  WBC 14.2* 15.6* 16.4* 10.4 8.5  NEUTROABS  --   --   --  8.8*  --   HGB 11.6* 12.1 11.8* 12.3 12.4  HCT 38.0 39.0 38.2 39.7 40.7  MCV 96.9 96.8 97.0 97.1 99.3  PLT 170 202 192 190 218   CBG:  Recent Labs Lab 04/04/17 0815  GLUCAP 128*   Urine analysis:    Component Value Date/Time   COLORURINE YELLOW 03/30/2017 2130   APPEARANCEUR CLEAR 03/30/2017 2130   LABSPEC 1.015 03/30/2017 2130   PHURINE 7.0 03/30/2017 2130   GLUCOSEU NEGATIVE 03/30/2017 2130   HGBUR NEGATIVE 03/30/2017 2130   [place] NEGATIVE 03/30/2017 2130   KETONESUR 5 (A) 03/30/2017 2130   PROTEINUR 30 (A) 03/30/2017 2130   UROBILINOGEN 0.2 08/20/2014 1613   NITRITE NEGATIVE 03/30/2017 2130   LEUKOCYTESUR NEGATIVE 03/30/2017 2130   Sepsis Labs: _0 (procalcitonin:4,lacticidven:4) ) Recent Results (from the past 240 hour(s))  MRSA PCR Screening     Status: None   Collection Time: 04/03/17  10:39 AM  Result Value Ref Range Status   MRSA by PCR NEGATIVE NEGATIVE Final    Comment:        The GeneXpert MRSA Assay (FDA approved for NASAL specimens only), is one component of a comprehensive MRSA colonization surveillance program. It is not intended to diagnose MRSA infection nor to guide or monitor treatment for MRSA infections.      Scheduled Meds: . bisacodyl  10 mg Rectal q morning - 10a  . diltiazem  60 mg Oral Q6H  . enoxaparin (LOVENOX) injection  30 mg Subcutaneous Q24H  . ipratropium-albuterol  3 mL Nebulization Q8H  . levothyroxine  25 mcg Intravenous Daily  . mouth rinse  15 mL Mouth Rinse BID  . metoprolol tartrate  5 mg Intravenous Q6H  . pantoprazole (PROTONIX) IV  40 mg Intravenous Q24H  . senna-docusate  1 tablet Oral QHS  . simethicone  80 mg Oral  BID   Continuous Infusions: . ampicillin-sulbactam (UNASYN) IV Stopped (04/07/17 0426)  . diltiazem (CARDIZEM) infusion 10 mg/hr (04/07/17 0800)    Procedures/Studies: Dg Abd 1 View  Result Date: 04/01/2017 CLINICAL DATA:  NG tube position. EXAM: ABDOMEN - 1 VIEW COMPARISON:  04/01/2017 FINDINGS: NG tube tip is in the mid stomach with the side port in the proximal stomach. IMPRESSION: NG tube tip in the mid stomach. Electronically Signed   By: Rolm Baptise M.D.   On: 04/01/2017 11:49   Ct Abdomen Pelvis W Contrast  Result Date: 03/30/2017 CLINICAL DATA:  Abdominal pain with bloating.  Nausea and vomiting. EXAM: CT ABDOMEN AND PELVIS WITH CONTRAST TECHNIQUE: Multidetector CT imaging of the abdomen and pelvis was performed using the standard protocol following bolus administration of intravenous contrast. CONTRAST:  19m ISOVUE-300 IOPAMIDOL (ISOVUE-300) INJECTION 61%, 101mISOVUE-300 IOPAMIDOL (ISOVUE-300) INJECTION 61% COMPARISON:  08/15/2007 FINDINGS: Lower chest: Tubular branching structures posterior lung bases suggest impacted airways but difficult to assess given the substantial breathing motion artifact.  Hepatobiliary: 7 mm hypervascular lesion subcapsular left liver cannot be further characterized. Mild intrahepatic biliary duct prominence. Gallbladder unremarkable. Extrahepatic common bile duct measures 8 mm diameter, increased in the interval. Pancreas: No focal mass lesion. No dilatation of the main duct. No intraparenchymal cyst. No peripancreatic edema. Spleen: No splenomegaly. No focal mass lesion. Adrenals/Urinary Tract: No adrenal nodule or mass. Cortical scarring noted both kidneys with small renal cysts noted bilaterally. No evidence for hydroureter. Bladder is distended. Stomach/Bowel: Stomach is mildly distended. Duodenum unremarkable. Small bowel loops in the lower abdomen and pelvis are fluid-filled and dilated. A prominently dilated loop in the mid pelvis measures up to 4.6 cm diameter. This dilated loop pin tracts down to the pelvis where a discrete transition point is identified in the right pelvis (see image 53 series 2 and also visible on sagittal image 52 of series 6). I cannot identify dilated small bowel beyond this transition zone to suggest a closed loop etiology. The terminal ileum is collapsed. The appendix is not visualized, but there is no edema or inflammation in the region of the cecum. Diverticuli are seen scattered along the entire length of the colon without CT findings of diverticulitis. Vascular/Lymphatic: There is abdominal aortic atherosclerosis without aneurysm. There is no gastrohepatic or hepatoduodenal ligament lymphadenopathy. No intraperitoneal or retroperitoneal lymphadenopathy. No pelvic sidewall lymphadenopathy. Reproductive: Uterus is surgically absent. There is no adnexal mass. Other: Free fluid is identified around the liver and spleen. Interloop mesenteric fluid is identified in the central small bowel mesentery. Musculoskeletal: Bone windows reveal no worrisome lytic or sclerotic osseous lesions. IMPRESSION: 1. Mechanical small bowel obstruction with small bowel  loops dilated up to 4.6 cm diameter and a definite transition zone identified in the right pelvis. Although there is some question of twisting in the region of the transition zone, there is no second transition zone identified nor dilated small bowel after the transition zone to suggest a closed loop obstruction. As such, imaging features likely related to an adhesion given the history of hysterectomy. There is fluid around the liver and spleen with interloop mesenteric fluid associated with the abnormal small bowel loops. No small bowel wall thickening or pneumatosis at this time to suggest overt bowel ischemia. 2. Probable airway impaction in both posterior lower lobes although not well assessed given motion artifact. Electronically Signed   By: ErMisty Stanley.D.   On: 03/30/2017 21:51   Dg Chest Port 1 View  Result Date: 04/07/2017 CLINICAL DATA:  Vomiting with concern for aspiration pneumonitis EXAM: PORTABLE CHEST 1 VIEW COMPARISON:  April 04, 2017 FINDINGS: There is persistent interstitial pulmonary edema. There is consolidation in the left lower lobe with left pleural effusion stable. There is less airspace opacity in the right base compared to recent study. Small right pleural effusion is present. Heart is mildly enlarged with pulmonary venous hypertension. There is aortic atherosclerosis. No new opacity evident. No adenopathy. There is advanced arthropathy in the right shoulder, stable. IMPRESSION: Slightly less opacity in the right base, likely slightly less pulmonary edema. Changes of congestive heart failure overall persist. Question superimposed pneumonia left lower lobe given the degree of consolidation. Stable left pleural effusion. There is aortic atherosclerosis. No evident. No new opacity evident. Advanced arthropathy right shoulder. Aortic Atherosclerosis (ICD10-I70.0). Electronically Signed   By: Lowella Grip III M.D.   On: 04/07/2017 08:25   Dg Chest Port 1 View  Result Date:  04/04/2017 CLINICAL DATA:  Acute on chronic diastolic heart failure. EXAM: PORTABLE CHEST 1 VIEW COMPARISON:  Radiograph 04/02/2017, 03/30/2017 FINDINGS: Stable heart size and mediastinal contours. Stable hazy opacity at both lung bases likely comminution of pleural fluid and atelectasis. Pulmonary edema with slight improvement from prior exam. Biapical pleuroparenchymal scarring again seen. No pneumothorax or new confluent airspace disease. Chronic change in the right shoulder. IMPRESSION: Slight improvement in congestive heart failure with mild decrease in pulmonary edema. Bilateral pleural effusions are unchanged. Electronically Signed   By: Jeb Levering M.D.   On: 04/04/2017 16:01   Dg Chest Port 1 View  Result Date: 04/02/2017 CLINICAL DATA:  Cough. EXAM: PORTABLE CHEST 1 VIEW COMPARISON:  03/30/2017 FINDINGS: 2 frontal radiographs. Nasogastric tube extends beyond the inferior aspect of the film. Patient rotated to the right. Midline trachea. Normal heart size. Layering bilateral pleural effusions are new or significantly increased. Greater on the right. No pneumothorax. progression of moderate interstitial edema. Development of bibasilar airspace disease. IMPRESSION: Worsened aeration, with new or increased moderate congestive heart failure. Development of bilateral pleural effusions and adjacent airspace disease, most likely atelectasis. Infection or aspiration cannot be excluded. Electronically Signed   By: Abigail Miyamoto M.D.   On: 04/02/2017 11:14   Dg Chest Portable 1 View  Result Date: 03/30/2017 CLINICAL DATA:  Nasogastric tube placement. Generalized abdominal pain and bloating, acute onset. Initial encounter. EXAM: PORTABLE CHEST 1 VIEW COMPARISON:  Chest radiograph performed 06/10/2016 FINDINGS: The patient's enteric tube is seen extending below the diaphragm. The lungs are hyperexpanded, with biapical scarring. Vascular congestion is noted, with increased interstitial markings. Mild  interstitial edema cannot be excluded. No pleural effusion or pneumothorax is seen. The cardiomediastinal silhouette is borderline normal in size. No acute osseous abnormalities are seen. Mild degenerative change is noted at the glenohumeral joints bilaterally. IMPRESSION: 1. Enteric tube noted extending below the diaphragm. 2. Lungs hyperexpanded, with biapical scarring, likely reflecting COPD. Underlying vascular congestion, with increased interstitial markings. Mild interstitial edema cannot be excluded. Electronically Signed   By: Garald Balding M.D.   On: 03/30/2017 23:55   Dg Abd 2 Views  Result Date: 04/05/2017 CLINICAL DATA:  Vomiting with nausea EXAM: ABDOMEN - 2 VIEW COMPARISON:  04/03/2017 FINDINGS: Small bilateral effusions with dense left lower lobe consolidation and patchy infiltrate at the right base. No definite free air beneath the diaphragm. Some contrast present in the colon. Contrast opacifies dilated small bowel in the lower abdomen and pelvis, measuring up to 4 cm. IMPRESSION: Dilated loops of small bowel containing contrast within the  lower abdomen and pelvis consistent with bowel obstruction. There is a small amount of contrast present in the colon. Small bilateral effusions and left greater than right bibasilar infiltrates Electronically Signed   By: Donavan Foil M.D.   On: 04/05/2017 21:07   Dg Abd 2 Views  Result Date: 04/03/2017 CLINICAL DATA:  Patient with history of small bowel obstruction. EXAM: ABDOMEN - 2 VIEW COMPARISON:  Abdominal radiograph 04/01/2017 FINDINGS: Enteric tube tip and side-port project over the stomach. Oral contrast material within the colon. Stable gaseous distended loops of small bowel within the central abdomen. Heterogeneous opacities lung bases bilaterally. Small bilateral pleural effusions. IMPRESSION: Enteric tube tip and side-port project over the stomach. Unchanged gaseous distended loops of bowel within the central abdomen. Layering effusions and  underlying opacities. Electronically Signed   By: Lovey Newcomer M.D.   On: 04/03/2017 09:13   Dg Abd 2 Views  Result Date: 04/01/2017 CLINICAL DATA:  Followup small bowel obstruction. EXAM: ABDOMEN - 2 VIEW COMPARISON:  03/31/2017 FINDINGS: There are persistent loops of dilated central small bowel with air-fluid levels consistent with a partial small bowel obstruction, similar to the prior study. There is no free air. Nasogastric tube extends below the diaphragm into the mid stomach. IMPRESSION: 1. Persistent partial small bowel obstruction. No significant change from the prior study. No free air. Electronically Signed   By: Lajean Manes M.D.   On: 04/01/2017 08:42   Dg Abd 2 Views  Result Date: 03/31/2017 CLINICAL DATA:  Follow-up small bowel obstruction. EXAM: ABDOMEN - 2 VIEW COMPARISON:  03/30/2017 abdominal CT FINDINGS: An NG tube is identified with tip overlying the proximal-mid stomach. Dilated small bowel loops are again noted and not significantly changed. There is no evidence of pneumoperitoneum. No other changes identified. IMPRESSION: Little significant change of dilated small bowel loops compatible with small bowel obstruction. No evidence of pneumoperitoneum. NG tube with tip overlying the proximal-mid stomach. Electronically Signed   By: Margarette Canada M.D.   On: 03/31/2017 21:12   Dg Swallowing Func-speech Pathology  Result Date: 04/05/2017 Objective Swallowing Evaluation: Type of Study: MBS-Modified Barium Swallow Study Patient Details Name: ARIZA EVANS MRN: [id number] Date of Birth: 1929/06/28 Today's Date: 04/05/2017 Time: SLP Start Time (ACUTE ONLY): 0805-SLP Stop Time (ACUTE ONLY): 0819 SLP Time Calculation (min) (ACUTE ONLY): 14 min Past Medical History: Past Medical History: Diagnosis Date . Aortic regurgitation  . Bronchiectasis ([place])  . COPD (chronic obstructive pulmonary disease) ([place])  . DJD (degenerative joint disease)  . HLD (hyperlipidemia)  . HTN (hypertension)  . Mild mitral  regurgitation by prior echocardiogram  . Mild tricuspid regurgitation  . Palpitations   a. has known history of PAC's and PVC's. b. 09/2015: monitor showed sinus rhythm with occasional PAC's and a brief episode of PAT. [name] Thyroid disease  Past Surgical History: Past Surgical History: Procedure Laterality Date . ABDOMINAL HYSTERECTOMY  1993 . CATARACT EXTRACTION, BILATERAL   HPI: 83 year old female with a history of bronchiectasis/COPD, chronic respiratory failure on 2 L at night, hypertension, hyperlipidemia, diastolic CHF, hypothyroidism, and PATpresented with a one to two-day history of abdominal pain with associated nausea and vomiting. The patient denied any fevers, chills, chest pain, shortness breath, diarrhea, hematochezia, melena. CT of the abdomen and pelvis at the time of admission showed dilated small bowel loops up to 4.6 cm in the mid pelvis with a transition zone in the right pelvis. NG tube was inserted for decompression and Gen. surgery was consulted to assist with management.  Overall, her bowel function has improved, and her NG tube was discontinued 7/8. Her diet was advanced which she tolerated. Her hospitalization has been Located by atrial tachycardia/atrial fibrillation, acute on chronic diastolic CHF, and aspiration pneumonia. Pt and family report continued swallowing difficulty. ST will evaluate and treat as indicated. No Data Recorded Assessment / Plan / Recommendation CHL IP CLINICAL IMPRESSIONS 04/05/2017 Clinical Impression Pt presents with an inconsistent mild to moderate oropharyngeal dysphagia. Oral phase characterized by decreased bolus cohesion of solids and delayed oral transit. Swallow initiation delayed throughout PO trials, to the level of the valleculae with puree and solids, and to the level of the pyriform sinuses with thin and nectar thick liquids. Prominent vallecular residuals evidenced secondary to reduced tongue base retraction, which were increased with solids and  thickened consistencies. Thin liquid alternation aided in clearing vallecular residuals.  Inconsistent penetration occured with thin liquids before the swallow that intermittently reached the level of the vocal cords and was cleared out of laryngeal vestibule intermittently. Volitional throat clear aided in expelling penetrates with cueing from SLP. Nectar thick liquids without penetration. As PO trials progressed pt did evidence cup and straw sips of thin liquids without penetration. Barium tablet administered with thin liquids. Pt displayed difficulty propelling tablet throughout pharynx, initially sticking in the valleculae, then the pyriform sinuses, ultimately requiring nectar thick liquids to clear through pharynx. As pt was struggling to pass pill throughout pharynx, silent aspiration of thin liquids occured after the swallow. Recommend chopped consistencies with thin liquids in isolation. Pt advised to avoid mixed consistencies as airway protection appears compromised when two consistencies administered at the same time. Recommend medicines whole in puree (crush larger meds as needed). ST to follow up for diet tolerance and education with family for safe swallow strategies  SLP Visit Diagnosis Dysphagia, oropharyngeal phase (R13.12) Attention and concentration deficit following -- Frontal lobe and executive function deficit following -- Impact on safety and function Moderate aspiration risk   CHL IP TREATMENT RECOMMENDATION 04/05/2017 Treatment Recommendations Therapy as outlined in treatment plan below   Prognosis 04/05/2017 Prognosis for Safe Diet Advancement Good Barriers to Reach Goals -- Barriers/Prognosis Comment -- CHL IP DIET RECOMMENDATION 04/05/2017 SLP Diet Recommendations Dysphagia 2 (Fine chop) solids;Thin liquid;No mixed consistencies Liquid Administration via -- Medication Administration Whole meds with puree Compensations Minimize environmental distractions;Slow rate;Small sips/bites;Multiple  dry swallows after each bite/sip;Follow solids with liquid;Clear throat intermittently Postural Changes Remain semi-upright after after feeds/meals (Comment);Seated upright at 90 degrees   CHL IP OTHER RECOMMENDATIONS 04/05/2017 Recommended Consults -- Oral Care Recommendations Oral care BID Other Recommendations --   CHL IP FOLLOW UP RECOMMENDATIONS 04/05/2017 Follow up Recommendations 24 hour supervision/assistance   CHL IP FREQUENCY AND DURATION 04/05/2017 Speech Therapy Frequency (ACUTE ONLY) min 2x/week Treatment Duration 2 weeks      CHL IP ORAL PHASE 04/05/2017 Oral Phase Impaired Oral - Pudding Teaspoon -- Oral - Pudding Cup -- Oral - Honey Teaspoon -- Oral - Honey Cup -- Oral - Nectar Teaspoon Delayed oral transit;Lingual/palatal residue Oral - Nectar Cup Delayed oral transit;Lingual/palatal residue;Weak lingual manipulation Oral - Nectar Straw Weak lingual manipulation;Delayed oral transit;Lingual/palatal residue Oral - Thin Teaspoon Weak lingual manipulation;Delayed oral transit;Lingual/palatal residue Oral - Thin Cup Weak lingual manipulation;Delayed oral transit;Lingual/palatal residue Oral - Thin Straw Delayed oral transit;Weak lingual manipulation;Lingual/palatal residue Oral - Puree Delayed oral transit;Weak lingual manipulation;Lingual pumping Oral - Mech Soft -- Oral - Regular Lingual/palatal residue;Piecemeal swallowing;Delayed oral transit;Decreased bolus cohesion;Weak lingual manipulation Oral - Multi-Consistency -- Oral -  Pill Lingual pumping;Reduced posterior propulsion;Delayed oral transit;Piecemeal swallowing;Lingual/palatal residue Oral Phase - Comment --  CHL IP PHARYNGEAL PHASE 04/05/2017 Pharyngeal Phase Impaired Pharyngeal- Pudding Teaspoon -- Pharyngeal -- Pharyngeal- Pudding Cup -- Pharyngeal -- Pharyngeal- Honey Teaspoon -- Pharyngeal -- Pharyngeal- Honey Cup -- Pharyngeal -- Pharyngeal- Nectar Teaspoon Reduced tongue base retraction;Delayed swallow initiation-vallecula;Delayed swallow  initiation-pyriform sinuses;Pharyngeal residue - valleculae Pharyngeal -- Pharyngeal- Nectar Cup Delayed swallow initiation-vallecula;Delayed swallow initiation-pyriform sinuses;Reduced tongue base retraction;Pharyngeal residue - valleculae Pharyngeal -- Pharyngeal- Nectar Straw -- Pharyngeal -- Pharyngeal- Thin Teaspoon Delayed swallow initiation-vallecula;Penetration/Aspiration during swallow;Penetration/Aspiration before swallow;Pharyngeal residue - valleculae;Reduced tongue base retraction;Reduced epiglottic inversion Pharyngeal Material enters airway, CONTACTS cords and then ejected out Pharyngeal- Thin Cup Delayed swallow initiation-vallecula;Reduced tongue base retraction;Pharyngeal residue - valleculae;Penetration/Aspiration during swallow;Penetration/Aspiration before swallow Pharyngeal Material enters airway, CONTACTS cords and not ejected out Pharyngeal- Thin Straw Penetration/Aspiration during swallow;Penetration/Aspiration before swallow;Delayed swallow initiation-vallecula;Reduced tongue base retraction;Pharyngeal residue - valleculae Pharyngeal Material enters airway, remains ABOVE vocal cords and not ejected out Pharyngeal- Puree Delayed swallow initiation-vallecula;Pharyngeal residue - valleculae;Reduced tongue base retraction Pharyngeal -- Pharyngeal- Mechanical Soft -- Pharyngeal -- Pharyngeal- Regular Delayed swallow initiation-vallecula;Pharyngeal residue - valleculae;Reduced tongue base retraction Pharyngeal -- Pharyngeal- Multi-consistency -- Pharyngeal -- Pharyngeal- Pill Delayed swallow initiation-vallecula;Other (Comment);Penetration/Apiration after swallow Pharyngeal Material enters airway, passes BELOW cords and not ejected out despite cough attempt by patient Pharyngeal Comment --  No flowsheet data found. Arvil Chaco MA, CCC-SLP Acute Care Speech Language Pathologist  Levi Aland 04/05/2017, 9:48 AM               Bethena Roys, MD [facility] Pager  [phone]  If 7PM-7AM, please contact night-coverage [email or web address] Password [facility] 04/07/2017, 12:23 PM   LOS: 7 days

## 2017-04-07 NOTE — Progress Notes (Signed)
PT ALERT AND ORIENTED. HR 88 IN ATRIAL FIB. IV NSR X2 PATENT. O2 AT 2L/MIN VIA [place].DIMINISHED BREATH SOUNDS.ABDOMEN REMAINS DISTENDED.TRANSFERRING TO ROOM 336 ON TELEMETRY.[name] TRANSFER REPORT CALLED TO BONNIE RN ON 300.

## 2017-04-07 NOTE — Progress Notes (Signed)
Andrea [name] is a 83 y.o. female patient. 1. SBO (small bowel obstruction) ([place])   2. Small bowel obstruction ([place])   3. Patient has nasogastric tube   4. Cough   5. Acute on chronic diastolic CHF (congestive heart failure) (Andrea [name])   6. Vomiting    Past Medical History:  Diagnosis Date  . Aortic regurgitation   . Bronchiectasis (Andrea [name])   . COPD (chronic obstructive pulmonary disease) (Andrea [name])   . DJD (degenerative joint disease)   . HLD (hyperlipidemia)   . HTN (hypertension)   . Mild mitral regurgitation by prior echocardiogram   . Mild tricuspid regurgitation   . Palpitations    a. has known history of PAC's and PVC's. b. 09/2015: monitor showed sinus rhythm with occasional PAC's and a brief episode of PAT.  Andrea [name] Thyroid disease    Current Facility-Administered Medications  Medication Dose Route Frequency Provider Last Rate Last Dose  . Ampicillin-Sulbactam (UNASYN) 3 g in sodium chloride 0.9 % 100 mL IVPB  3 g Intravenous Therisa Doyne, MD   Stopped at 04/07/17 [phone]  . bisacodyl (DULCOLAX) suppository 10 mg  10 mg Rectal q morning - 10a Aviva Signs, MD   10 mg at 04/06/17 0900  . diltiazem (CARDIZEM) 100 mg in dextrose 5 % 100 mL (1 mg/mL) infusion  5-15 mg/hr Intravenous Titrated Orson Eva, MD 10 mL/hr at 04/07/17 0800 10 mg/hr at 04/07/17 0800  . enoxaparin (LOVENOX) injection 30 mg  30 mg Subcutaneous Q24H Tat, David, MD   30 mg at 04/07/17 0021  . hydrALAZINE (APRESOLINE) injection 5 mg  5 mg Intravenous Q8H PRN Tat, David, MD      . ipratropium-albuterol (DUONEB) 0.5-2.5 (3) MG/3ML nebulizer solution 3 mL  3 mL Nebulization Q6H PRN Darrick Meigs, Marge Duncans, MD      . ipratropium-albuterol (DUONEB) 0.5-2.5 (3) MG/3ML nebulizer solution 3 mL  3 mL Nebulization Q8H Orson Eva, MD   3 mL at 04/07/17 0746  . levothyroxine (SYNTHROID, LEVOTHROID) injection 25 mcg  25 mcg Intravenous Daily Tat, David, MD   25 mcg at 04/06/17 0900  . LORazepam (ATIVAN) injection 0.25 mg  0.25 mg Intravenous TID PRN  Orson Eva, MD      . MEDLINE mouth rinse  15 mL Mouth Rinse BID Tat, David, MD   15 mL at 04/06/17 2119  . metoprolol tartrate (LOPRESSOR) injection 5 mg  5 mg Intravenous Q6H Orson Eva, MD   5 mg at 04/07/17 0507  . morphine 2 MG/ML injection 2 mg  2 mg Intravenous Q6H PRN Cristal Ford, DO   2 mg at 04/06/17 0346  . ondansetron (ZOFRAN) tablet 4 mg  4 mg Oral Q6H PRN Oswald Hillock, MD       Or  . ondansetron [facility]) injection 4 mg  4 mg Intravenous Q6H PRN Oswald Hillock, MD   4 mg at 04/06/17 2029  . pantoprazole (PROTONIX) injection 40 mg  40 mg Intravenous Q24H Orson Eva, MD   40 mg at 04/07/17 0818  . senna-docusate (Senokot-S) tablet 1 tablet  1 tablet Oral QHS Aviva Signs, MD   1 tablet at 04/06/17 2113  . simethicone (MYLICON) chewable tablet 80 mg  80 mg Oral BID Orson Eva, MD   80 mg at 04/06/17 2113   Allergies  Allergen Reactions  . Vancomycin     Worsening kidney function  . Sulfa Antibiotics Palpitations    Unknown   Active Problems:   Essential hypertension   COPD  with emphysema (HCC)   Hypothyroidism   Hyponatremia   Chronic diastolic CHF (congestive heart failure) (HCC)   SBO (small bowel obstruction) (HCC)   Chronic respiratory failure with hypoxia (HCC)   Small bowel obstruction (HCC)   Atrial tachycardia (HCC)   Aspiration pneumonitis (HCC)   Acute on chronic diastolic CHF (congestive heart failure) (HCC)   AF (paroxysmal atrial fibrillation) (HCC)  Blood pressure (!) 105/54, pulse (!) 122, temperature 98.2 F (36.8 C), temperature source Oral, resp. rate 20, height _0  (1.6 m), weight 99 lb 3.3 oz (45 kg), SpO2 99 %.  Subjective No acute events overnight.  +BMs, min PO intake, still c/o of bloating but denies abd pain or nausea Objective  GEN: NAD HEENT: enopthalmia ABD: soft, distended, tympanic, no pain Assessment & Plan 83 y/o F with mult med problems and partial SBO -- encourage PO intake, family may bring similar food to dysphagia II  diet of patients choice.  If no increase in PO intake, she may need to start TPN. -- plan for CT a/p with po contrast in AM to assess progress or lack thereof.   -- Overall poor surgical candidate.  D/W patient and husband Andres [name] 04/07/2017

## 2017-04-08 ENCOUNTER — Inpatient Hospital Stay (HOSPITAL_COMMUNITY): Payer: Medicare Other

## 2017-04-08 DIAGNOSIS — E43 Unspecified severe protein-calorie malnutrition: Secondary | ICD-10-CM | POA: Diagnosis present

## 2017-04-08 MED ORDER — ENSURE ENLIVE PO LIQD
237.0000 mL | Freq: Two times a day (BID) | ORAL | Status: DC
  Administered 2017-04-08 – 2017-04-10 (×3): 237 mL via ORAL

## 2017-04-08 MED ORDER — MIRTAZAPINE 15 MG PO TABS
7.5000 mg | ORAL_TABLET | Freq: Every day | ORAL | Status: DC
  Administered 2017-04-09 – 2017-04-11 (×3): 7.5 mg via ORAL
  Filled 2017-04-08 (×4): qty 1

## 2017-04-08 MED ORDER — IOPAMIDOL (ISOVUE-300) INJECTION 61%
INTRAVENOUS | Status: AC
  Administered 2017-04-08: 30 mL
  Filled 2017-04-08: qty 30

## 2017-04-08 MED ORDER — LEVOTHYROXINE SODIUM 25 MCG PO TABS
25.0000 ug | ORAL_TABLET | Freq: Every day | ORAL | Status: DC
  Administered 2017-04-09 – 2017-04-12 (×4): 25 ug via ORAL
  Filled 2017-04-08 (×4): qty 1

## 2017-04-08 MED ORDER — IOPAMIDOL (ISOVUE-300) INJECTION 61%
100.0000 mL | Freq: Once | INTRAVENOUS | Status: AC | PRN
  Administered 2017-04-08: 80 mL via INTRAVENOUS

## 2017-04-08 NOTE — Progress Notes (Signed)
Andrea [name] is a 83 y.o. female patient. 1. SBO (small bowel obstruction) ([place])   2. Small bowel obstruction ([place])   3. Patient has nasogastric tube   4. Cough   5. Acute on chronic diastolic CHF (congestive heart failure) ([place])   6. Vomiting    Past Medical History:  Diagnosis Date  . Aortic regurgitation   . Bronchiectasis ([place])   . COPD (chronic obstructive pulmonary disease) ([place])   . DJD (degenerative joint disease)   . HLD (hyperlipidemia)   . HTN (hypertension)   . Mild mitral regurgitation by prior echocardiogram   . Mild tricuspid regurgitation   . Palpitations    a. has known history of PAC's and PVC's. b. 09/2015: monitor showed sinus rhythm with occasional PAC's and a brief episode of PAT.  [name] Thyroid disease    Current Facility-Administered Medications  Medication Dose Route Frequency Provider Last Rate Last Dose  . ALPRAZolam (XANAX) tablet 0.25 mg  0.25 mg Oral TID PRN Emokpae, Ejiroghene E, MD      . Ampicillin-Sulbactam (UNASYN) 3 g in sodium chloride 0.9 % 100 mL IVPB  3 g Intravenous Therisa Doyne, MD   Stopped at 04/08/17 0430  . bisacodyl (DULCOLAX) suppository 10 mg  10 mg Rectal q morning - 10a Aviva Signs, MD   Stopped at 04/08/17 [phone]  . diltiazem (CARDIZEM) tablet 60 mg  60 mg Oral Q6H Emokpae, Ejiroghene E, MD   60 mg at 04/08/17 0508  . enoxaparin (LOVENOX) injection 30 mg  30 mg Subcutaneous Q24H Tat, David, MD   30 mg at 04/08/17 0032  . furosemide (LASIX) tablet 20 mg  20 mg Oral Daily Emokpae, Ejiroghene E, MD   20 mg at 04/08/17 0949  . hydrALAZINE (APRESOLINE) injection 5 mg  5 mg Intravenous Q8H PRN Tat, David, MD      . ipratropium-albuterol (DUONEB) 0.5-2.5 (3) MG/3ML nebulizer solution 3 mL  3 mL Nebulization Q6H PRN Darrick Meigs, Marge Duncans, MD      . ipratropium-albuterol (DUONEB) 0.5-2.5 (3) MG/3ML nebulizer solution 3 mL  3 mL Nebulization Q8H Orson Eva, MD   3 mL at 04/08/17 0747  . levothyroxine (SYNTHROID, LEVOTHROID) tablet 50 mcg  50 mcg  Oral QAC breakfast Emokpae, Ejiroghene E, MD   50 mcg at 04/08/17 0837  . MEDLINE mouth rinse  15 mL Mouth Rinse BID Tat, David, MD   15 mL at 04/07/17 2140  . mirtazapine (REMERON) tablet 7.5 mg  7.5 mg Oral QHS Emokpae, Ejiroghene E, MD      . morphine 2 MG/ML injection 2 mg  2 mg Intravenous Q6H PRN Cristal Ford, DO   2 mg at 04/06/17 0346  . ondansetron (ZOFRAN) tablet 4 mg  4 mg Oral Q6H PRN Oswald Hillock, MD       Or  . ondansetron [facility]) injection 4 mg  4 mg Intravenous Q6H PRN Oswald Hillock, MD   4 mg at 04/06/17 2029  . pantoprazole (PROTONIX) injection 40 mg  40 mg Intravenous Q24H Orson Eva, MD   40 mg at 04/08/17 7793  . senna-docusate (Senokot-S) tablet 1 tablet  1 tablet Oral QHS Aviva Signs, MD   1 tablet at 04/07/17 2140  . simethicone (MYLICON) chewable tablet 80 mg  80 mg Oral BID Orson Eva, MD   80 mg at 04/08/17 0949   Allergies  Allergen Reactions  . Vancomycin     Worsening kidney function  . Sulfa Antibiotics Palpitations  Unknown   Active Problems:   Essential hypertension   COPD with emphysema (HCC)   Hypothyroidism   Hyponatremia   Chronic diastolic CHF (congestive heart failure) (HCC)   SBO (small bowel obstruction) (HCC)   Chronic respiratory failure with hypoxia (HCC)   Small bowel obstruction (HCC)   Atrial tachycardia (HCC)   Aspiration pneumonitis (HCC)   Acute on chronic diastolic CHF (congestive heart failure) (HCC)   AF (paroxysmal atrial fibrillation) (HCC)  Blood pressure (!) 124/40, pulse (!) 119, temperature 97.8 F (36.6 C), temperature source Oral, resp. rate 18, height _0  (1.6 m), weight 99 lb 10.4 oz (45.2 kg), SpO2 98 %.  Subjective: Symptoms:  Improved.  She reports weakness and anorexia.   Diet:  Poor intake.  No nausea or vomiting.   Pain:  She reports no pain.    Objective: General Appearance:  Comfortable and in no acute distress.   Vital signs: (most recent): Blood pressure (!) 124/40, pulse (!) 119,  temperature 97.8 F (36.6 C), temperature source Oral, resp. rate 18, height _1  (1.6 m), weight 99 lb 10.4 oz (45.2 kg), SpO2 98 %.   HEENT: Normal HEENT exam.   Lungs:  Normal effort.  She is not in respiratory distress.   Heart: Irregular rhythm.   Abdomen: Abdomen is soft and non-distended.  There is no abdominal tenderness.    Extremities: Normal range of motion.   Neurological: Patient is alert and oriented to person, place and time.  Normal strength.    Assessment:  Condition: In stable condition.  Improving.   (Resolving SBO).   Plan:  Advance diet as tolerated.  (CT reviewed.  Call Surgery if needed ).    [name] 04/08/2017

## 2017-04-08 NOTE — Care Management Important Message (Signed)
Important Message  Patient Details  Name: Andrea [name] MRN: [id number] Date of Birth: 1929/02/25   Medicare Important Message Given:  Yes    Sherald Barge, RN 04/08/2017, 10:51 AM

## 2017-04-08 NOTE — Progress Notes (Signed)
PT Cancellation Note  Patient Details Name: Andrea [name] MRN: [id number] DOB: 1929/01/18   Cancelled Treatment:    Reason Eval/Treat Not Completed: Medical issues which prohibited therapy (Chart reviewed, spoke to patient and family at bedside. Pt's HR remains in AF 130s-150s resting in bed, contraindicating OOB/exersion with PT at this time. Holding treatment. ) Will attempt treatment again at later date/time.  11:44 AM, 04/08/17 Etta Grandchild, PT, DPT Physical Therapist - [place] [phone] (Office)    Buccola,Allan C 04/08/2017, 11:43 AM

## 2017-04-08 NOTE — Clinical Social Work Note (Signed)
Clinical Social Work Assessment Late Entry for 04/07/17  Patient Details  Name: Andrea [name] MRN: [id number] Date of Birth: [birth year]  Date of referral:  04/07/17               Reason for consult:  Discharge Planning                Permission sought to share information with:    Permission granted to share information::     Name::        Agency::     Relationship::     Contact Information:  Patient's daughters [name] and [name] were at bedside.   Housing/Transportation Living arrangements for the past 2 months:  [place] of Information:  Adult Children Patient Interpreter Needed:  None Criminal Activity/Legal Involvement Pertinent to Current Situation/Hospitalization:  No - Comment as needed Significant Relationships:  Adult Children Lives with:  Self Do you feel safe going back to the place where you live?  Yes Need for family participation in patient care:  Yes (Comment)  Care giving concerns:  None identified at baseline.    Social Worker assessment / plan:  Patient lives alone. At baseline she is independent in ADLs and ambulation. Family is agreeable to SNF. LCSW provided both daughters of SNF lists.  Daughters advised that they would review the list and provided LCSW with their facility choices for information to be sent.  LCSW provided telephone contact number to patient's daughters.  Employment status:  Part-Time Nurse, adult PT Recommendations:  [place] / Referral to community resources:  [place]  Patient/Family's Response to care:  Family is agreeable for patient to go to SNF.   Patient/Family's Understanding of and Emotional Response to Diagnosis, Current Treatment, and Prognosis:  Family understands patient's diagnosis, treatment and prognosis and are hopeful that patient will return to her baseline.   Emotional Assessment Appearance:  Appears stated  age Attitude/Demeanor/Rapport:  Unable to Assess Affect (typically observed):  Unable to Assess Orientation:  Oriented to Self, Oriented to Place, Oriented to  Time, Oriented to Situation Alcohol / Substance use:  Not Applicable Psych involvement (Current and /or in the community):  No (Comment)  Discharge Needs  Concerns to be addressed:  Discharge Planning Concerns Readmission within the last 30 days:  No Current discharge risk:  None Barriers to Discharge:  No Barriers Identified   Ihor Gully, LCSW 04/08/2017, 10:09 AM

## 2017-04-08 NOTE — Clinical Social Work Placement (Signed)
   CLINICAL SOCIAL WORK PLACEMENT  NOTE  Date:  04/08/2017  Patient Details  Name: Andrea [name] MRN: [id number] Date of Birth: 18-Dec-1928  Clinical Social Work is seeking post-discharge placement for this patient at the [place] level of care (*CSW will initial, date and re-position this form in  chart as items are completed):  Yes   Patient/family provided with [place] Work Department's list of facilities offering this level of care within the geographic area requested by the patient (or if unable, by the patient's family).  Yes   Patient/family informed of their freedom to choose among providers that offer the needed level of care, that participate in Medicare, Medicaid or managed care program needed by the patient, have an available bed and are willing to accept the patient.  Yes   Patient/family informed of [facility]'s ownership interest in [facility] and [facility], as well as of the fact that they are under no obligation to receive care at these facilities.  PASRR submitted to EDS on 04/07/17     PASRR number received on 04/07/17     Existing PASRR number confirmed on       FL2 transmitted to all facilities in geographic area requested by pt/family on       FL2 transmitted to all facilities within larger geographic area on       Patient informed that his/her managed care company has contracts with or will negotiate with certain facilities, including the following:            Patient/family informed of bed offers received.  Patient chooses bed at       Physician recommends and patient chooses bed at      Patient to be transferred to   on  .  Patient to be transferred to facility by       Patient family notified on   of transfer.  Name of family member notified:        PHYSICIAN       Additional Comment:    _______________________________________________ Ihor Gully, LCSW 04/08/2017, 10:15 AM

## 2017-04-08 NOTE — Progress Notes (Signed)
PROGRESS NOTE  Andrea [name]:[id number] DOB: [birth year] DOA: 03/30/2017 PCP: Unk Pinto, MD Brief History: 83 year old female with a history of bronchiectasis/COPD, chronic respiratory failure on 2 L at night, hypertension, hyperlipidemia, diastolic CHF, hypothyroidism, and PATpresented with a one to two-day history of abdominal pain with associated nausea and vomiting. The patient denied any fevers, chills, chest pain, shortness breath, diarrhea, hematochezia, melena. CT of the abdomen and pelvis at the time of admission showed dilated small bowel loops up to 4.6 cm in the mid pelvis with a transition zone in the right pelvis. NG tube was inserted for decompression and Gen. surgery was consulted to assist with management. Overall, her bowel function has improved, and her NG tube was discontinued. Her diet was advanced which she tolerated. Her hospitalization has been complicated by atrial tachycardia/SVT, acute on chronic diastolic CHF, and aspiration pneumonia.  Assessment/Plan: Small bowel obstruction- Likely due to adhesions. No vomiting, having bowel movements. Abdominal bloating improved. . CT abdomen and pelvis with contrast 7/13- near-complete resolution of dilated small bowel loops since previous study  - Gen. Surgery recs appreciated - continue with dysphagia 2 diet with thin liquids, following speech therapy evaluation - PT eval- SNF recommended - Social work consulted, awaiting placement  SVT/Atrial tachycardia- intermittently heart rate increases. Asymptomatic -  Continue Cardizem 60 mg every 6 hourly, consolidated dose tomorrow -TSH--0.936 -04/22/2016 echo EF 60-65%, grade 2 DD, no WMA, PASP 55 -04/03/17 echo--EF 60-65%, no WMA, PASP 42 - Reduced dose of Synthroid from 50-20 considering low TSH, and synthroid can provoke cardiac arrhythmias.  Aspiration pneumonia-  Chest xray- opacity right base, ? Superimposed pneumonia left lower lobe. No fever, no  leukocytosis. CT abdomen- new small bilateral pleural effusions. L >R. Left lower lobe infiltrate versus atelectasis. -due to vomiting from SBO -Continue Unasyn X 7 days  Protein energy malnutrition, Anorexia-  - Mirtazapine - Nutritional consult appreciated - Ensure  Acute on Chronic diastolic CHF -7/41/42 CXR-Pulmonary edema. -  Resume home dose 5m  Lasix  with improving creatinine, had contrast exposure for abdominal Ct, will hold Lasix for now. -Saline lockIV fluids -04/03/17 echo--EF 60-65%, no WMA, PASP 42, no remark on diastolic dysfunction. - BMP a.m.  COPD/bronchiectasis/chronic respiratory failure with hypoxia- stable -On 2 L nasal cannula nighttime at home -Presently stable on 2L - Continue duonebs  Hypokalemia/Hypomagnesemia -repleted -recheck mag--1.9  Essential hypertension -Holding bisoprolol/HCTZ initially due to SBO -Holding amlodipine and losartan initially due to SBO -hydralazine IV prn SBP >180 - Bp stable on new regimen of cardizem.  Anxiety -D/c IV ativan q 8 hrs prn, resume home xanax.  Hypothyroidism - D/c Iv, resume home synthroid   Disposition Plan: Pending surgery eval. Stable from medical standpoint.  Family Communication: Daughtersupdatedat bedside 7/10--Total time spent 35 minutes. Greater than 50% spent face to face counseling and coordinating care.  Consultants: General surgery  Code Status: FULL   DVT Prophylaxis: [place] Lovenox  Procedures: As Listed in Progress Note Above  Antibiotics: Unasyn 04/03/17>>>  Subjective: No complaints this a.m.. Vomiting last night had bowel movements 2-3 yesterday after enema. Feels better this morning no abdominal pain no bloating  Objective: Vitals:   04/08/17 0535 04/08/17 0600 04/08/17 0747 04/08/17 1414  BP: (!) 124/40     Pulse: (!) 119     Resp: 18     Temp: 97.8 F (36.6 C)     TempSrc: Oral     SpO2: 100%  98% 93%  Weight:  45.2 kg (99 lb 10.4 oz)    Height:         Intake/Output Summary (Last 24 hours) at 04/08/17 2032 Last data filed at 04/08/17 1600  Gross per 24 hour  Intake              200 ml  Output              201 ml  Net               -1 ml   Weight change: 0.2 kg (7.1 oz) Exam:   General:  Pt is alert, follows commands appropriately, not in acute distress  HEENT: No icterus, No thrush, No neck mass, [place]/AT  Cardiovascular: RRR, S1/S2, no rubs, no gallops  Respiratory: no wheeze, normal work of breathing on 2 L O2  Abdomen: Soft/+BS, non tender, non distended, no guarding  Extremities: Trace LE edema, appears chronic  Data Reviewed: I have personally reviewed following labs and imaging studies Basic Metabolic Panel:  Recent Labs Lab 04/02/17 0605 04/03/17 0320 04/04/17 0502 04/05/17 0359 04/06/17 0448 04/07/17 0412  NA 145 144 147* 145 145 145  K 3.3* 3.5 3.0* 3.5 3.6 3.4*  CL 105 101 98* 98* 97* 96*  CO2 32 29 40* 40* 34* 41*  GLUCOSE 77 122* 151* 123* 146* 115*  BUN 25* 26* 24* 25* 33* 34*  CREATININE 0.81 0.98 0.93 0.98 1.14* 1.04*  CALCIUM 8.4* 8.5* 8.4* 8.9 8.9 8.7*  MG 1.7  --  1.4* 1.9  --   --     Recent Labs Lab 04/02/17 0605 04/03/17 0320 04/04/17 0502 04/05/17 0359 04/06/17 0448  WBC 14.2* 15.6* 16.4* 10.4 8.5  NEUTROABS  --   --   --  8.8*  --   HGB 11.6* 12.1 11.8* 12.3 12.4  HCT 38.0 39.0 38.2 39.7 40.7  MCV 96.9 96.8 97.0 97.1 99.3  PLT 170 202 192 190 218   CBG:  Recent Labs Lab 04/04/17 0815  GLUCAP 128*   Urine analysis:    Component Value Date/Time   COLORURINE YELLOW 03/30/2017 2130   APPEARANCEUR CLEAR 03/30/2017 2130   LABSPEC 1.015 03/30/2017 2130   PHURINE 7.0 03/30/2017 2130   GLUCOSEU NEGATIVE 03/30/2017 2130   HGBUR NEGATIVE 03/30/2017 2130   [place] NEGATIVE 03/30/2017 2130   KETONESUR 5 (A) 03/30/2017 2130   PROTEINUR 30 (A) 03/30/2017 2130   UROBILINOGEN 0.2 08/20/2014 1613   NITRITE NEGATIVE 03/30/2017 2130   LEUKOCYTESUR NEGATIVE 03/30/2017 2130    Sepsis Labs: _0 (procalcitonin:4,lacticidven:4) ) Recent Results (from the past 240 hour(s))  MRSA PCR Screening     Status: None   Collection Time: 04/03/17 10:39 AM  Result Value Ref Range Status   MRSA by PCR NEGATIVE NEGATIVE Final    Comment:        The GeneXpert MRSA Assay (FDA approved for NASAL specimens only), is one component of a comprehensive MRSA colonization surveillance program. It is not intended to diagnose MRSA infection nor to guide or monitor treatment for MRSA infections.      Scheduled Meds: . bisacodyl  10 mg Rectal q morning - 10a  . diltiazem  60 mg Oral Q6H  . enoxaparin (LOVENOX) injection  30 mg Subcutaneous Q24H  . feeding supplement (ENSURE ENLIVE)  237 mL Oral BID BM  . furosemide  20 mg Oral Daily  . ipratropium-albuterol  3 mL Nebulization Q8H  . [START ON 04/09/2017] levothyroxine  25 mcg Oral QAC breakfast  .  mouth rinse  15 mL Mouth Rinse BID  . mirtazapine  7.5 mg Oral QHS  . pantoprazole (PROTONIX) IV  40 mg Intravenous Q24H  . senna-docusate  1 tablet Oral QHS  . simethicone  80 mg Oral BID   Continuous Infusions: . ampicillin-sulbactam (UNASYN) IV Stopped (04/08/17 1519)    Procedures/Studies: Dg Abd 1 View  Result Date: 04/01/2017 CLINICAL DATA:  NG tube position. EXAM: ABDOMEN - 1 VIEW COMPARISON:  04/01/2017 FINDINGS: NG tube tip is in the mid stomach with the side port in the proximal stomach. IMPRESSION: NG tube tip in the mid stomach. Electronically Signed   By: Rolm Baptise M.D.   On: 04/01/2017 11:49   Ct Abdomen Pelvis W Contrast  Result Date: 04/08/2017 CLINICAL DATA:  Abdominal pain with nausea and vomiting. Small bowel obstruction. EXAM: CT ABDOMEN AND PELVIS WITH CONTRAST TECHNIQUE: Multidetector CT imaging of the abdomen and pelvis was performed using the standard protocol following bolus administration of intravenous contrast. CONTRAST:  63m ISOVUE-300 IOPAMIDOL (ISOVUE-300) INJECTION 61% COMPARISON:   03/30/2017 FINDINGS: Lower Chest: New small pleural effusions, left side greater than right. New left lower lobe atelectasis versus infiltrate. Mild compressive atelectasis in right lung base. Hepatobiliary: 1 cm homogeneous hypervascular lesion in the anterior left hepatic lobe shows no significant change since recent study. This most likely represents a tiny flash-filling hemangioma. Tiny sub-cm cyst again seen in the posterior left hepatic lobe. Gallbladder is unremarkable. Pancreas:  No mass or inflammatory changes. Spleen: Within normal limits in size and appearance. Adrenals/Urinary Tract: No masses identified. Tiny bilateral renal cysts again noted. No evidence of hydronephrosis. Stomach/Bowel: There has been near complete resolution of dilated bowel loops since prior study. Diverticulosis again seen involving the descending and sigmoid colon, however there is no evidence of diverticulitis. Vascular/Lymphatic: No pathologically enlarged lymph nodes. No abdominal aortic aneurysm. Aortic atherosclerosis. Reproductive: Prior hysterectomy noted. Adnexal regions are unremarkable in appearance. Other:  None. Musculoskeletal: No suspicious bone lesions identified. Advanced lumbar spondylosis and moderate dextroscoliosis again noted. IMPRESSION: Near complete resolution of dilated small bowel loops since previous study. Colonic diverticulosis, without radiographic evidence of diverticulitis. New small bilateral pleural effusions, left side greater than right, and left lower lobe atelectasis versus infiltrate. Aortic atherosclerosis. Electronically Signed   By: JEarle GellM.D.   On: 04/08/2017 09:40   Ct Abdomen Pelvis W Contrast  Result Date: 03/30/2017 CLINICAL DATA:  Abdominal pain with bloating.  Nausea and vomiting. EXAM: CT ABDOMEN AND PELVIS WITH CONTRAST TECHNIQUE: Multidetector CT imaging of the abdomen and pelvis was performed using the standard protocol following bolus administration of intravenous  contrast. CONTRAST:  326mISOVUE-300 IOPAMIDOL (ISOVUE-300) INJECTION 61%, 10076mSOVUE-300 IOPAMIDOL (ISOVUE-300) INJECTION 61% COMPARISON:  08/15/2007 FINDINGS: Lower chest: Tubular branching structures posterior lung bases suggest impacted airways but difficult to assess given the substantial breathing motion artifact. Hepatobiliary: 7 mm hypervascular lesion subcapsular left liver cannot be further characterized. Mild intrahepatic biliary duct prominence. Gallbladder unremarkable. Extrahepatic common bile duct measures 8 mm diameter, increased in the interval. Pancreas: No focal mass lesion. No dilatation of the main duct. No intraparenchymal cyst. No peripancreatic edema. Spleen: No splenomegaly. No focal mass lesion. Adrenals/Urinary Tract: No adrenal nodule or mass. Cortical scarring noted both kidneys with small renal cysts noted bilaterally. No evidence for hydroureter. Bladder is distended. Stomach/Bowel: Stomach is mildly distended. Duodenum unremarkable. Small bowel loops in the lower abdomen and pelvis are fluid-filled and dilated. A prominently dilated loop in the mid pelvis measures up to  4.6 cm diameter. This dilated loop pin tracts down to the pelvis where a discrete transition point is identified in the right pelvis (see image 53 series 2 and also visible on sagittal image 52 of series 6). I cannot identify dilated small bowel beyond this transition zone to suggest a closed loop etiology. The terminal ileum is collapsed. The appendix is not visualized, but there is no edema or inflammation in the region of the cecum. Diverticuli are seen scattered along the entire length of the colon without CT findings of diverticulitis. Vascular/Lymphatic: There is abdominal aortic atherosclerosis without aneurysm. There is no gastrohepatic or hepatoduodenal ligament lymphadenopathy. No intraperitoneal or retroperitoneal lymphadenopathy. No pelvic sidewall lymphadenopathy. Reproductive: Uterus is surgically  absent. There is no adnexal mass. Other: Free fluid is identified around the liver and spleen. Interloop mesenteric fluid is identified in the central small bowel mesentery. Musculoskeletal: Bone windows reveal no worrisome lytic or sclerotic osseous lesions. IMPRESSION: 1. Mechanical small bowel obstruction with small bowel loops dilated up to 4.6 cm diameter and a definite transition zone identified in the right pelvis. Although there is some question of twisting in the region of the transition zone, there is no second transition zone identified nor dilated small bowel after the transition zone to suggest a closed loop obstruction. As such, imaging features likely related to an adhesion given the history of hysterectomy. There is fluid around the liver and spleen with interloop mesenteric fluid associated with the abnormal small bowel loops. No small bowel wall thickening or pneumatosis at this time to suggest overt bowel ischemia. 2. Probable airway impaction in both posterior lower lobes although not well assessed given motion artifact. Electronically Signed   By: Misty Stanley M.D.   On: 03/30/2017 21:51   Dg Chest Port 1 View  Result Date: 04/07/2017 CLINICAL DATA:  Vomiting with concern for aspiration pneumonitis EXAM: PORTABLE CHEST 1 VIEW COMPARISON:  April 04, 2017 FINDINGS: There is persistent interstitial pulmonary edema. There is consolidation in the left lower lobe with left pleural effusion stable. There is less airspace opacity in the right base compared to recent study. Small right pleural effusion is present. Heart is mildly enlarged with pulmonary venous hypertension. There is aortic atherosclerosis. No new opacity evident. No adenopathy. There is advanced arthropathy in the right shoulder, stable. IMPRESSION: Slightly less opacity in the right base, likely slightly less pulmonary edema. Changes of congestive heart failure overall persist. Question superimposed pneumonia left lower lobe given  the degree of consolidation. Stable left pleural effusion. There is aortic atherosclerosis. No evident. No new opacity evident. Advanced arthropathy right shoulder. Aortic Atherosclerosis (ICD10-I70.0). Electronically Signed   By: Lowella Grip III M.D.   On: 04/07/2017 08:25   Dg Chest Port 1 View  Result Date: 04/04/2017 CLINICAL DATA:  Acute on chronic diastolic heart failure. EXAM: PORTABLE CHEST 1 VIEW COMPARISON:  Radiograph 04/02/2017, 03/30/2017 FINDINGS: Stable heart size and mediastinal contours. Stable hazy opacity at both lung bases likely comminution of pleural fluid and atelectasis. Pulmonary edema with slight improvement from prior exam. Biapical pleuroparenchymal scarring again seen. No pneumothorax or new confluent airspace disease. Chronic change in the right shoulder. IMPRESSION: Slight improvement in congestive heart failure with mild decrease in pulmonary edema. Bilateral pleural effusions are unchanged. Electronically Signed   By: Jeb Levering M.D.   On: 04/04/2017 16:01   Dg Chest Port 1 View  Result Date: 04/02/2017 CLINICAL DATA:  Cough. EXAM: PORTABLE CHEST 1 VIEW COMPARISON:  03/30/2017 FINDINGS: 2 frontal radiographs.  Nasogastric tube extends beyond the inferior aspect of the film. Patient rotated to the right. Midline trachea. Normal heart size. Layering bilateral pleural effusions are new or significantly increased. Greater on the right. No pneumothorax. progression of moderate interstitial edema. Development of bibasilar airspace disease. IMPRESSION: Worsened aeration, with new or increased moderate congestive heart failure. Development of bilateral pleural effusions and adjacent airspace disease, most likely atelectasis. Infection or aspiration cannot be excluded. Electronically Signed   By: Abigail Miyamoto M.D.   On: 04/02/2017 11:14   Dg Chest Portable 1 View  Result Date: 03/30/2017 CLINICAL DATA:  Nasogastric tube placement. Generalized abdominal pain and bloating,  acute onset. Initial encounter. EXAM: PORTABLE CHEST 1 VIEW COMPARISON:  Chest radiograph performed 06/10/2016 FINDINGS: The patient's enteric tube is seen extending below the diaphragm. The lungs are hyperexpanded, with biapical scarring. Vascular congestion is noted, with increased interstitial markings. Mild interstitial edema cannot be excluded. No pleural effusion or pneumothorax is seen. The cardiomediastinal silhouette is borderline normal in size. No acute osseous abnormalities are seen. Mild degenerative change is noted at the glenohumeral joints bilaterally. IMPRESSION: 1. Enteric tube noted extending below the diaphragm. 2. Lungs hyperexpanded, with biapical scarring, likely reflecting COPD. Underlying vascular congestion, with increased interstitial markings. Mild interstitial edema cannot be excluded. Electronically Signed   By: Garald Balding M.D.   On: 03/30/2017 23:55   Dg Abd 2 Views  Result Date: 04/05/2017 CLINICAL DATA:  Vomiting with nausea EXAM: ABDOMEN - 2 VIEW COMPARISON:  04/03/2017 FINDINGS: Small bilateral effusions with dense left lower lobe consolidation and patchy infiltrate at the right base. No definite free air beneath the diaphragm. Some contrast present in the colon. Contrast opacifies dilated small bowel in the lower abdomen and pelvis, measuring up to 4 cm. IMPRESSION: Dilated loops of small bowel containing contrast within the lower abdomen and pelvis consistent with bowel obstruction. There is a small amount of contrast present in the colon. Small bilateral effusions and left greater than right bibasilar infiltrates Electronically Signed   By: Donavan Foil M.D.   On: 04/05/2017 21:07   Dg Abd 2 Views  Result Date: 04/03/2017 CLINICAL DATA:  Patient with history of small bowel obstruction. EXAM: ABDOMEN - 2 VIEW COMPARISON:  Abdominal radiograph 04/01/2017 FINDINGS: Enteric tube tip and side-port project over the stomach. Oral contrast material within the colon. Stable  gaseous distended loops of small bowel within the central abdomen. Heterogeneous opacities lung bases bilaterally. Small bilateral pleural effusions. IMPRESSION: Enteric tube tip and side-port project over the stomach. Unchanged gaseous distended loops of bowel within the central abdomen. Layering effusions and underlying opacities. Electronically Signed   By: Lovey Newcomer M.D.   On: 04/03/2017 09:13   Dg Abd 2 Views  Result Date: 04/01/2017 CLINICAL DATA:  Followup small bowel obstruction. EXAM: ABDOMEN - 2 VIEW COMPARISON:  03/31/2017 FINDINGS: There are persistent loops of dilated central small bowel with air-fluid levels consistent with a partial small bowel obstruction, similar to the prior study. There is no free air. Nasogastric tube extends below the diaphragm into the mid stomach. IMPRESSION: 1. Persistent partial small bowel obstruction. No significant change from the prior study. No free air. Electronically Signed   By: Lajean Manes M.D.   On: 04/01/2017 08:42   Dg Abd 2 Views  Result Date: 03/31/2017 CLINICAL DATA:  Follow-up small bowel obstruction. EXAM: ABDOMEN - 2 VIEW COMPARISON:  03/30/2017 abdominal CT FINDINGS: An NG tube is identified with tip overlying the proximal-mid stomach. Dilated small  bowel loops are again noted and not significantly changed. There is no evidence of pneumoperitoneum. No other changes identified. IMPRESSION: Little significant change of dilated small bowel loops compatible with small bowel obstruction. No evidence of pneumoperitoneum. NG tube with tip overlying the proximal-mid stomach. Electronically Signed   By: Margarette Canada M.D.   On: 03/31/2017 21:12   Dg Swallowing Func-speech Pathology  Result Date: 04/05/2017 Objective Swallowing Evaluation: Type of Study: MBS-Modified Barium Swallow Study Patient Details Name: RAGHAD LORENZ MRN: [id number] Date of Birth: 11/19/1928 Today's Date: 04/05/2017 Time: SLP Start Time (ACUTE ONLY): 0805-SLP Stop Time (ACUTE ONLY):  0819 SLP Time Calculation (min) (ACUTE ONLY): 14 min Past Medical History: Past Medical History: Diagnosis Date . Aortic regurgitation  . Bronchiectasis ([place])  . COPD (chronic obstructive pulmonary disease) ([place])  . DJD (degenerative joint disease)  . HLD (hyperlipidemia)  . HTN (hypertension)  . Mild mitral regurgitation by prior echocardiogram  . Mild tricuspid regurgitation  . Palpitations   a. has known history of PAC's and PVC's. b. 09/2015: monitor showed sinus rhythm with occasional PAC's and a brief episode of PAT. [name] Thyroid disease  Past Surgical History: Past Surgical History: Procedure Laterality Date . ABDOMINAL HYSTERECTOMY  1993 . CATARACT EXTRACTION, BILATERAL   HPI: 83 year old female with a history of bronchiectasis/COPD, chronic respiratory failure on 2 L at night, hypertension, hyperlipidemia, diastolic CHF, hypothyroidism, and PATpresented with a one to two-day history of abdominal pain with associated nausea and vomiting. The patient denied any fevers, chills, chest pain, shortness breath, diarrhea, hematochezia, melena. CT of the abdomen and pelvis at the time of admission showed dilated small bowel loops up to 4.6 cm in the mid pelvis with a transition zone in the right pelvis. NG tube was inserted for decompression and Gen. surgery was consulted to assist with management. Overall, her bowel function has improved, and her NG tube was discontinued 7/8. Her diet was advanced which she tolerated. Her hospitalization has been Located by atrial tachycardia/atrial fibrillation, acute on chronic diastolic CHF, and aspiration pneumonia. Pt and family report continued swallowing difficulty. ST will evaluate and treat as indicated. No Data Recorded Assessment / Plan / Recommendation CHL IP CLINICAL IMPRESSIONS 04/05/2017 Clinical Impression Pt presents with an inconsistent mild to moderate oropharyngeal dysphagia. Oral phase characterized by decreased bolus cohesion of solids and delayed oral transit.  Swallow initiation delayed throughout PO trials, to the level of the valleculae with puree and solids, and to the level of the pyriform sinuses with thin and nectar thick liquids. Prominent vallecular residuals evidenced secondary to reduced tongue base retraction, which were increased with solids and thickened consistencies. Thin liquid alternation aided in clearing vallecular residuals.  Inconsistent penetration occured with thin liquids before the swallow that intermittently reached the level of the vocal cords and was cleared out of laryngeal vestibule intermittently. Volitional throat clear aided in expelling penetrates with cueing from SLP. Nectar thick liquids without penetration. As PO trials progressed pt did evidence cup and straw sips of thin liquids without penetration. Barium tablet administered with thin liquids. Pt displayed difficulty propelling tablet throughout pharynx, initially sticking in the valleculae, then the pyriform sinuses, ultimately requiring nectar thick liquids to clear through pharynx. As pt was struggling to pass pill throughout pharynx, silent aspiration of thin liquids occured after the swallow. Recommend chopped consistencies with thin liquids in isolation. Pt advised to avoid mixed consistencies as airway protection appears compromised when two consistencies administered at the same time. Recommend medicines whole in puree (  crush larger meds as needed). ST to follow up for diet tolerance and education with family for safe swallow strategies  SLP Visit Diagnosis Dysphagia, oropharyngeal phase (R13.12) Attention and concentration deficit following -- Frontal lobe and executive function deficit following -- Impact on safety and function Moderate aspiration risk   CHL IP TREATMENT RECOMMENDATION 04/05/2017 Treatment Recommendations Therapy as outlined in treatment plan below   Prognosis 04/05/2017 Prognosis for Safe Diet Advancement Good Barriers to Reach Goals -- Barriers/Prognosis  Comment -- CHL IP DIET RECOMMENDATION 04/05/2017 SLP Diet Recommendations Dysphagia 2 (Fine chop) solids;Thin liquid;No mixed consistencies Liquid Administration via -- Medication Administration Whole meds with puree Compensations Minimize environmental distractions;Slow rate;Small sips/bites;Multiple dry swallows after each bite/sip;Follow solids with liquid;Clear throat intermittently Postural Changes Remain semi-upright after after feeds/meals (Comment);Seated upright at 90 degrees   CHL IP OTHER RECOMMENDATIONS 04/05/2017 Recommended Consults -- Oral Care Recommendations Oral care BID Other Recommendations --   CHL IP FOLLOW UP RECOMMENDATIONS 04/05/2017 Follow up Recommendations 24 hour supervision/assistance   CHL IP FREQUENCY AND DURATION 04/05/2017 Speech Therapy Frequency (ACUTE ONLY) min 2x/week Treatment Duration 2 weeks      CHL IP ORAL PHASE 04/05/2017 Oral Phase Impaired Oral - Pudding Teaspoon -- Oral - Pudding Cup -- Oral - Honey Teaspoon -- Oral - Honey Cup -- Oral - Nectar Teaspoon Delayed oral transit;Lingual/palatal residue Oral - Nectar Cup Delayed oral transit;Lingual/palatal residue;Weak lingual manipulation Oral - Nectar Straw Weak lingual manipulation;Delayed oral transit;Lingual/palatal residue Oral - Thin Teaspoon Weak lingual manipulation;Delayed oral transit;Lingual/palatal residue Oral - Thin Cup Weak lingual manipulation;Delayed oral transit;Lingual/palatal residue Oral - Thin Straw Delayed oral transit;Weak lingual manipulation;Lingual/palatal residue Oral - Puree Delayed oral transit;Weak lingual manipulation;Lingual pumping Oral - Mech Soft -- Oral - Regular Lingual/palatal residue;Piecemeal swallowing;Delayed oral transit;Decreased bolus cohesion;Weak lingual manipulation Oral - Multi-Consistency -- Oral - Pill Lingual pumping;Reduced posterior propulsion;Delayed oral transit;Piecemeal swallowing;Lingual/palatal residue Oral Phase - Comment --  CHL IP PHARYNGEAL PHASE 04/05/2017  Pharyngeal Phase Impaired Pharyngeal- Pudding Teaspoon -- Pharyngeal -- Pharyngeal- Pudding Cup -- Pharyngeal -- Pharyngeal- Honey Teaspoon -- Pharyngeal -- Pharyngeal- Honey Cup -- Pharyngeal -- Pharyngeal- Nectar Teaspoon Reduced tongue base retraction;Delayed swallow initiation-vallecula;Delayed swallow initiation-pyriform sinuses;Pharyngeal residue - valleculae Pharyngeal -- Pharyngeal- Nectar Cup Delayed swallow initiation-vallecula;Delayed swallow initiation-pyriform sinuses;Reduced tongue base retraction;Pharyngeal residue - valleculae Pharyngeal -- Pharyngeal- Nectar Straw -- Pharyngeal -- Pharyngeal- Thin Teaspoon Delayed swallow initiation-vallecula;Penetration/Aspiration during swallow;Penetration/Aspiration before swallow;Pharyngeal residue - valleculae;Reduced tongue base retraction;Reduced epiglottic inversion Pharyngeal Material enters airway, CONTACTS cords and then ejected out Pharyngeal- Thin Cup Delayed swallow initiation-vallecula;Reduced tongue base retraction;Pharyngeal residue - valleculae;Penetration/Aspiration during swallow;Penetration/Aspiration before swallow Pharyngeal Material enters airway, CONTACTS cords and not ejected out Pharyngeal- Thin Straw Penetration/Aspiration during swallow;Penetration/Aspiration before swallow;Delayed swallow initiation-vallecula;Reduced tongue base retraction;Pharyngeal residue - valleculae Pharyngeal Material enters airway, remains ABOVE vocal cords and not ejected out Pharyngeal- Puree Delayed swallow initiation-vallecula;Pharyngeal residue - valleculae;Reduced tongue base retraction Pharyngeal -- Pharyngeal- Mechanical Soft -- Pharyngeal -- Pharyngeal- Regular Delayed swallow initiation-vallecula;Pharyngeal residue - valleculae;Reduced tongue base retraction Pharyngeal -- Pharyngeal- Multi-consistency -- Pharyngeal -- Pharyngeal- Pill Delayed swallow initiation-vallecula;Other (Comment);Penetration/Apiration after swallow Pharyngeal Material enters  airway, passes BELOW cords and not ejected out despite cough attempt by patient Pharyngeal Comment --  No flowsheet data found. Arvil Chaco MA, CCC-SLP Acute Care Speech Language Pathologist  Levi Aland 04/05/2017, 9:48 AM               Bethena Roys, MD [facility] Pager [phone]  If 7PM-7AM, please contact night-coverage [email or web address] Password  [email or web address] 04/08/2017, 8:32 PM   LOS: 8 days

## 2017-04-08 NOTE — Clinical Social Work Placement (Signed)
   CLINICAL SOCIAL WORK PLACEMENT  NOTE  Date:  04/08/2017  Patient Details  Name: KINZLY PIERRELOUIS MRN: [id number] Date of Birth: 12-30-1928  Clinical Social Work is seeking post-discharge placement for this patient at the [place] level of care (*CSW will initial, date and re-position this form in  chart as items are completed):  Yes   Patient/family provided with [facility] Work Department's list of facilities offering this level of care within the geographic area requested by the patient (or if unable, by the patient's family).  Yes   Patient/family informed of their freedom to choose among providers that offer the needed level of care, that participate in Medicare, Medicaid or managed care program needed by the patient, have an available bed and are willing to accept the patient.  Yes   Patient/family informed of [facility]'s ownership interest in [facility] and [facility], as well as of the fact that they are under no obligation to receive care at these facilities.  PASRR submitted to EDS on 04/07/17     PASRR number received on 04/07/17     Existing PASRR number confirmed on       FL2 transmitted to all facilities in geographic area requested by pt/family on       FL2 transmitted to all facilities within larger geographic area on       Patient informed that his/her managed care company has contracts with or will negotiate with certain facilities, including the following:        Yes   Patient/family informed of bed offers received.  Patient chooses bed at [facility]     Physician recommends and patient chooses bed at      Patient to be transferred to [facility] on  .  Patient to be transferred to facility by       Patient family notified on   of transfer.  Name of family member notified:        PHYSICIAN       Additional Comment:    _______________________________________________ Ihor Gully,  LCSW 04/08/2017, 2:21 PM

## 2017-04-08 NOTE — Progress Notes (Signed)
Initial Nutrition Assessment  DOCUMENTATION CODES:  Severe malnutrition in context of acute illness/injury, Underweight  INTERVENTION:  Took myriad of food requests. Helped pt set up meals for tonight and tomorrow. Left phone number for dietary department for more personalized one-on-one care  Ensure Enlive po BID, each supplement provides 350 kcal and 20 grams of protein   Will add whole Chock-milk on her trays.   Given resolution of SBO, If patient remains unable to meet 50% of need in coming 1-2 days, would recommend nutrition support. Would highly advocate for trialing NGT with small bore tube, prior to TPN initiation.   Enteral nutrition vastly preferred over Parenteral Nutrition due to its ability to preserve mucosal integrity/decrease risk of infection, support gut-associated lymphoid tissue, reduce risk of cholecystitis and support efficient substrate utilization with first pass metabolism.   NUTRITION DIAGNOSIS:  Inadequate oral intake related to dysphagia, inability to eat, acute illness, nausea, vomiting (SBO, resultant weakness) as evidenced by objective information to support minimal to no intake x 9 days and potentially 13-14 days with addition of PTA report from pt and wt loss of >2% x 1 week.   GOAL:  Patient will meet greater than or equal to 90% of their needs  MONITOR:  PO intake, Supplement acceptance, Diet advancement, Labs, I & O's  REASON FOR ASSESSMENT:  Consult Assessment of nutrition requirement/status + Poor po intake  ASSESSMENT:  83 y/o female PMHx Hypothyroidism, HLD, HTN, CHF, COPD. Initially presented with 1 day of abdominal pain, retching, nausea, vomiting. Worked up for small bowel obstruction. NGT inserted for decompression.   Hospital course: Pt had multiple bowel movements on 7/7 and her diet was advanced as follows: CL on 7/8 -> FL 7/9 -> D1 7/9 -> D2 7/10  Found to have aspiration PNA on 7/8. Placed on mechanically altered diets after SLP eval  that was brought on when pt was noted to not be swallowing well by nursing on 7/9.  Patient vomited again on 7/10 with abd imaging consistent with obstruction. Made NPO for ~24 hours.  Initially, family declined replacement of NGT, but as she continued to have episodes of vomiting, the tube was attempted to be placed 7/11, but ultimately unsuccessful as pt could not tolerate.  ---------------------------------------------  Per review of intake documentation, besides her 1 day of Clear liquids, she has not eaten much at all. She has been eating 10-15% of meals since placed on D2 diet.   Pt today reports that she actually has a very good appetite. The problem is that she is "too weak to eat". She says she had not eat 4-5 days PRIOR to even being admitted. This would indicate ~2 weeks without almost any significant intake.   We discussed potential interventions to help her eat better. She wasn't too fond of the Ensure at first, but she is agreeable to having it prepared as in a shake. She also enjoys choc milk. Will order this on her trays.   RD called dietary and helped plan the next few meals for the patient. We were limited by the D2 restrictions. The patient says she can chew fine and wishes her diet was regular. Hopefully will be able to advance as she gets stronger.   Her lowest weight in hospital was 98 lbs. She was admitted at 101/102 lbs. This would indicate a loss of >2% since admit, diagnosing her with severe malnutrition in acute context when paired with oral intake history.   Physical Exam: Severe muscle and fat wasting.  Devoid of all muscle/fat mass.   Labs: K: 3.4, BG:115, BUN/Creat:34/1.04 (creat improved), 4/30: A1C:5.4, BNP:433,  Meds: Ensure Enlive, Lasix, Remeron, ppi, Unasyn, Senokot, Zofran   Recent Labs Lab 04/02/17 0605  04/04/17 0502 04/05/17 0359 04/06/17 0448 04/07/17 0412  NA 145  < > 147* 145 145 145  K 3.3*  < > 3.0* 3.5 3.6 3.4*  CL 105  < > 98* 98* 97* 96*   CO2 32  < > 40* 40* 34* 41*  BUN 25*  < > 24* 25* 33* 34*  CREATININE 0.81  < > 0.93 0.98 1.14* 1.04*  CALCIUM 8.4*  < > 8.4* 8.9 8.9 8.7*  MG 1.7  --  1.4* 1.9  --   --   GLUCOSE 77  < > 151* 123* 146* 115*  < > = values in this interval not displayed.  Diet Order:  DIET DYS 2 Room service appropriate? Yes; Fluid consistency: Thin  Skin:  Reviewed, no issues  Last BM:  7/13  Height:  Ht Readings from Last 1 Encounters:  03/31/17 _0  (1.6 m)   Weight:  Wt Readings from Last 1 Encounters:  04/08/17 99 lb 10.4 oz (45.2 kg)   Wt Readings from Last 10 Encounters:  04/08/17 99 lb 10.4 oz (45.2 kg)  01/24/17 102 lb 9.6 oz (46.5 kg)  12/27/16 104 lb 6.4 oz (47.4 kg)  10/04/16 104 lb (47.2 kg)  09/09/16 103 lb 3.2 oz (46.8 kg)  08/17/16 99 lb 9.6 oz (45.2 kg)  06/10/16 102 lb 6.4 oz (46.4 kg)  05/05/16 100 lb 9.6 oz (45.6 kg)  04/19/16 99 lb 6.4 oz (45.1 kg)  04/19/16 98 lb (44.5 kg)   Ideal Body Weight:  52.27 kg  BMI:  Body mass index is 17.65 kg/m.  Estimated Nutritional Needs:  Kcal:  1550-1750 kcals (35-40 kcal/kg bw) Protein:  62-71 g pro (1.4-1.6 g/kg bw) Fluid:  >1.1 L (25 ml/kg bw)  EDUCATION NEEDS:  No education needs identified at this time  Burtis Junes RD, LDN, [place] Nutrition Pager: [phone] 04/08/2017 4:01 PM

## 2017-04-08 NOTE — Progress Notes (Addendum)
Central telemetry contacted this nurse, patient had a run of SVT Heart rate in 170's. Patient is asymptomatic, patient states she was doing some leg exercises and removing her socks. MD made aware

## 2017-04-09 LAB — BASIC METABOLIC PANEL
Anion gap: 6 (ref 5–15)
BUN: 24 mg/dL — AB (ref 6–20)
CALCIUM: 8.5 mg/dL — AB (ref 8.9–10.3)
CHLORIDE: 98 mmol/L — AB (ref 101–111)
CO2: 42 mmol/L — AB (ref 22–32)
CREATININE: 0.87 mg/dL (ref 0.44–1.00)
GFR calc non Af Amer: 59 mL/min — ABNORMAL LOW (ref >60)
Glucose, Bld: 101 mg/dL — ABNORMAL HIGH (ref 65–99)
Potassium: 3.4 mmol/L — ABNORMAL LOW (ref 3.5–5.1)
SODIUM: 146 mmol/L — AB (ref 135–145)

## 2017-04-09 LAB — TROPONIN I

## 2017-04-09 MED ORDER — POTASSIUM CHLORIDE 20 MEQ PO PACK
40.0000 meq | PACK | Freq: Two times a day (BID) | ORAL | Status: DC
  Administered 2017-04-09 – 2017-04-12 (×7): 40 meq via ORAL
  Filled 2017-04-09 (×7): qty 2

## 2017-04-09 MED ORDER — METOPROLOL TARTRATE 5 MG/5ML IV SOLN
5.0000 mg | INTRAVENOUS | Status: AC | PRN
  Administered 2017-04-09 (×3): 5 mg via INTRAVENOUS
  Filled 2017-04-09 (×3): qty 5

## 2017-04-09 NOTE — Progress Notes (Signed)
PT Cancellation Note  Patient Details Name: Andrea [name] MRN: [id number] DOB: 1929/09/24   Cancelled Treatment:    Reason Eval/Treat Not Completed: Medical issues which prohibited therapy (pt's HR remains in afib and around 120 at rest. Will check back at a later time.)    Geraldine Solar PT, DPT

## 2017-04-09 NOTE — Progress Notes (Addendum)
PROGRESS NOTE  ZAHARA REMBERT TTS:[id number] DOB: 06/09/1929 DOA: 03/30/2017 PCP: Unk Pinto, MD Brief History: 83 year old female with a history of bronchiectasis/COPD, chronic respiratory failure on 2 L at night, hypertension, hyperlipidemia, diastolic CHF, hypothyroidism, and PATpresented with a one to two-day history of abdominal pain with associated nausea and vomiting. The patient denied any fevers, chills, chest pain, shortness breath, diarrhea, hematochezia, melena. CT of the abdomen and pelvis at the time of admission showed dilated small bowel loops up to 4.6 cm in the mid pelvis with a transition zone in the right pelvis. NG tube was inserted for decompression and Gen. surgery was consulted to assist with management. Overall, her bowel function has improved, and her NG tube was discontinued. Her diet was advanced which she tolerated. Her hospitalization has been complicated by atrial tachycardia/SVT, acute on chronic diastolic CHF, and aspiration pneumonia.  Assessment/Plan: Small bowel obstruction- resolving. Likely due to adhesions. CT abdomen and pelvis with contrast 7/13- near-complete resolution of dilated small bowel loops since previous study  - Gen. Surgery recs appreciated - continue with dysphagia 2 diet with thin liquids, following speech therapy evaluation - PT eval- SNF recommended - Social work consulted, awaiting placement  SVT/Atrial tachycardia/ atrial flutter- intermittently heart rate increases. Asymptomatic.  -  Continue Cardizem 60 mg every 6 hourly, consolidated dose tomorrow -TSH--0.936 -04/22/2016 echo EF 60-65%, grade 2 DD, no WMA, PASP 55 -04/03/17 echo--EF 60-65%, no WMA, PASP 42 - Reduced dose of Synthroid from 50-20 considering low TSH, and synthroid can provoke cardiac arrhythmias. - EKG shows today atrial flutter, rates in the 110s, will need to discuss anticoagulation in the a.m. with family. Patient has a cardiologist saw Dr. Stanford Breed  07/2016. Per chart she has had several episodes of palpitations. CHADVASC score at least 3.  - Trop x3  Aspiration pneumonia-  Chest xray- opacity right base, ? Superimposed pneumonia left lower lobe. No fever, no leukocytosis. CT abdomen- new small bilateral pleural effusions. L >R. Left lower lobe infiltrate versus atelectasis. -due to vomiting from SBO -Continue Unasyn X 7 days  Protein energy malnutrition, Anorexia-  - Mirtazapine - Nutritional consult appreciated - Ensure  Acute on Chronic diastolic CHF- appears euvolemic. Sodium mildly up at 146. No IV fluids- she has mild pulmonary edema and pleural effusions. Encourage by mouth fluids.  -04/07/17 CXR-Pulmonary edema. -  Resume home dose 67m  Lasix  with improving creatinine, had contrast exposure for abdominal Ct,  -Saline lockIV fluids -04/03/17 echo--EF 60-65%, no WMA, PASP 42, no remark on diastolic dysfunction.  COPD/bronchiectasis/chronic respiratory failure with hypoxia- stable -On 2 L nasal cannula nighttime at home -Presently stable on 2L - Continue duonebs  Hypokalemia/Hypomagnesemia- k- 3.4 today. -repleted -recheck mag--1.9  Essential hypertension -Holding bisoprolol/HCTZ initially due to SBO -Holding amlodipine and losartan initially due to SBO -hydralazine IV prn SBP >180 - Bp stable on new regimen of cardizem.  Anxiety -D/c IV ativan q 8 hrs prn, resume home xanax.  Hypothyroidism - D/c Iv, resume home synthroid   Disposition Plan: Awaiting bed availability- SNF.  Family Communication: Family- significant other updated at bedside 7/14  Consultants: General surgery  Code Status: FULL   DVT Prophylaxis: [place] Lovenox  Procedures: As Listed in Progress Note Above  Antibiotics: Unasyn 04/03/17>>>  Subjective: No complaints this a.m.. Vomiting last night had bowel movements 2-3 yesterday after enema. Feels better this morning no abdominal pain no bloating  Objective: Vitals:    04/08/17 2231  04/09/17 0202 04/09/17 0506 04/09/17 0748  BP:   (!) 100/56   Pulse:   (!) 107   Resp:   18   Temp:   97.8 F (36.6 C)   TempSrc:   Oral   SpO2: 98%  99% 97%  Weight:  44.7 kg (98 lb 8.7 oz)    Height:        Intake/Output Summary (Last 24 hours) at 04/09/17 1334 Last data filed at 04/09/17 1015  Gross per 24 hour  Intake              200 ml  Output              151 ml  Net               49 ml   Weight change: -0.5 kg (-1 lb 1.6 oz) Exam:   General:  Pt is alert, follows commands appropriately, not in acute distress  HEENT: No icterus, No thrush, No neck mass, [place]/AT  Cardiovascular: RRR, S1/S2, no rubs, no gallops  Respiratory: no wheeze, normal work of breathing on 2 L O2  Abdomen: Soft/+BS, non tender, non distended, no guarding  Extremities: Trace LE edema, appears chronic  Data Reviewed: I have personally reviewed following labs and imaging studies Basic Metabolic Panel:  Recent Labs Lab 04/04/17 0502 04/05/17 0359 04/06/17 0448 04/07/17 0412 04/09/17 0630  NA 147* 145 145 145 146*  K 3.0* 3.5 3.6 3.4* 3.4*  CL 98* 98* 97* 96* 98*  CO2 40* 40* 34* 41* 42*  GLUCOSE 151* 123* 146* 115* 101*  BUN 24* 25* 33* 34* 24*  CREATININE 0.93 0.98 1.14* 1.04* 0.87  CALCIUM 8.4* 8.9 8.9 8.7* 8.5*  MG 1.4* 1.9  --   --   --     Recent Labs Lab 04/03/17 0320 04/04/17 0502 04/05/17 0359 04/06/17 0448  WBC 15.6* 16.4* 10.4 8.5  NEUTROABS  --   --  8.8*  --   HGB 12.1 11.8* 12.3 12.4  HCT 39.0 38.2 39.7 40.7  MCV 96.8 97.0 97.1 99.3  PLT 202 192 190 218   CBG:  Recent Labs Lab 04/04/17 0815  GLUCAP 128*   Urine analysis:    Component Value Date/Time   COLORURINE YELLOW 03/30/2017 2130   APPEARANCEUR CLEAR 03/30/2017 2130   LABSPEC 1.015 03/30/2017 2130   PHURINE 7.0 03/30/2017 2130   GLUCOSEU NEGATIVE 03/30/2017 2130   HGBUR NEGATIVE 03/30/2017 2130   BILIRUBINUR NEGATIVE 03/30/2017 2130   KETONESUR 5 (A) 03/30/2017 2130    PROTEINUR 30 (A) 03/30/2017 2130   UROBILINOGEN 0.2 08/20/2014 1613   NITRITE NEGATIVE 03/30/2017 2130   LEUKOCYTESUR NEGATIVE 03/30/2017 2130   Sepsis Labs: _0 (procalcitonin:4,lacticidven:4) ) Recent Results (from the past 240 hour(s))  MRSA PCR Screening     Status: None   Collection Time: 04/03/17 10:39 AM  Result Value Ref Range Status   MRSA by PCR NEGATIVE NEGATIVE Final    Comment:        The GeneXpert MRSA Assay (FDA approved for NASAL specimens only), is one component of a comprehensive MRSA colonization surveillance program. It is not intended to diagnose MRSA infection nor to guide or monitor treatment for MRSA infections.      Scheduled Meds: . bisacodyl  10 mg Rectal q morning - 10a  . diltiazem  60 mg Oral Q6H  . enoxaparin (LOVENOX) injection  30 mg Subcutaneous Q24H  . feeding supplement (ENSURE ENLIVE)  237 mL Oral BID BM  .  furosemide  20 mg Oral Daily  . ipratropium-albuterol  3 mL Nebulization Q8H  . levothyroxine  25 mcg Oral QAC breakfast  . mouth rinse  15 mL Mouth Rinse BID  . mirtazapine  7.5 mg Oral QHS  . pantoprazole (PROTONIX) IV  40 mg Intravenous Q24H  . potassium chloride  40 mEq Oral BID  . senna-docusate  1 tablet Oral QHS  . simethicone  80 mg Oral BID   Continuous Infusions: . ampicillin-sulbactam (UNASYN) IV Stopped (04/09/17 0152)    Procedures/Studies: Dg Abd 1 View  Result Date: 04/01/2017 CLINICAL DATA:  NG tube position. EXAM: ABDOMEN - 1 VIEW COMPARISON:  04/01/2017 FINDINGS: NG tube tip is in the mid stomach with the side port in the proximal stomach. IMPRESSION: NG tube tip in the mid stomach. Electronically Signed   By: Rolm Baptise M.D.   On: 04/01/2017 11:49   Ct Abdomen Pelvis W Contrast  Result Date: 04/08/2017 CLINICAL DATA:  Abdominal pain with nausea and vomiting. Small bowel obstruction. EXAM: CT ABDOMEN AND PELVIS WITH CONTRAST TECHNIQUE: Multidetector CT imaging of the abdomen and pelvis was performed  using the standard protocol following bolus administration of intravenous contrast. CONTRAST:  31m ISOVUE-300 IOPAMIDOL (ISOVUE-300) INJECTION 61% COMPARISON:  03/30/2017 FINDINGS: Lower Chest: New small pleural effusions, left side greater than right. New left lower lobe atelectasis versus infiltrate. Mild compressive atelectasis in right lung base. Hepatobiliary: 1 cm homogeneous hypervascular lesion in the anterior left hepatic lobe shows no significant change since recent study. This most likely represents a tiny flash-filling hemangioma. Tiny sub-cm cyst again seen in the posterior left hepatic lobe. Gallbladder is unremarkable. Pancreas:  No mass or inflammatory changes. Spleen: Within normal limits in size and appearance. Adrenals/Urinary Tract: No masses identified. Tiny bilateral renal cysts again noted. No evidence of hydronephrosis. Stomach/Bowel: There has been near complete resolution of dilated bowel loops since prior study. Diverticulosis again seen involving the descending and sigmoid colon, however there is no evidence of diverticulitis. Vascular/Lymphatic: No pathologically enlarged lymph nodes. No abdominal aortic aneurysm. Aortic atherosclerosis. Reproductive: Prior hysterectomy noted. Adnexal regions are unremarkable in appearance. Other:  None. Musculoskeletal: No suspicious bone lesions identified. Advanced lumbar spondylosis and moderate dextroscoliosis again noted. IMPRESSION: Near complete resolution of dilated small bowel loops since previous study. Colonic diverticulosis, without radiographic evidence of diverticulitis. New small bilateral pleural effusions, left side greater than right, and left lower lobe atelectasis versus infiltrate. Aortic atherosclerosis. Electronically Signed   By: JEarle GellM.D.   On: 04/08/2017 09:40   Ct Abdomen Pelvis W Contrast  Result Date: 03/30/2017 CLINICAL DATA:  Abdominal pain with bloating.  Nausea and vomiting. EXAM: CT ABDOMEN AND PELVIS WITH  CONTRAST TECHNIQUE: Multidetector CT imaging of the abdomen and pelvis was performed using the standard protocol following bolus administration of intravenous contrast. CONTRAST:  385mISOVUE-300 IOPAMIDOL (ISOVUE-300) INJECTION 61%, 10088mSOVUE-300 IOPAMIDOL (ISOVUE-300) INJECTION 61% COMPARISON:  08/15/2007 FINDINGS: Lower chest: Tubular branching structures posterior lung bases suggest impacted airways but difficult to assess given the substantial breathing motion artifact. Hepatobiliary: 7 mm hypervascular lesion subcapsular left liver cannot be further characterized. Mild intrahepatic biliary duct prominence. Gallbladder unremarkable. Extrahepatic common bile duct measures 8 mm diameter, increased in the interval. Pancreas: No focal mass lesion. No dilatation of the main duct. No intraparenchymal cyst. No peripancreatic edema. Spleen: No splenomegaly. No focal mass lesion. Adrenals/Urinary Tract: No adrenal nodule or mass. Cortical scarring noted both kidneys with small renal cysts noted bilaterally. No evidence for hydroureter.  Bladder is distended. Stomach/Bowel: Stomach is mildly distended. Duodenum unremarkable. Small bowel loops in the lower abdomen and pelvis are fluid-filled and dilated. A prominently dilated loop in the mid pelvis measures up to 4.6 cm diameter. This dilated loop pin tracts down to the pelvis where a discrete transition point is identified in the right pelvis (see image 53 series 2 and also visible on sagittal image 52 of series 6). I cannot identify dilated small bowel beyond this transition zone to suggest a closed loop etiology. The terminal ileum is collapsed. The appendix is not visualized, but there is no edema or inflammation in the region of the cecum. Diverticuli are seen scattered along the entire length of the colon without CT findings of diverticulitis. Vascular/Lymphatic: There is abdominal aortic atherosclerosis without aneurysm. There is no gastrohepatic or  hepatoduodenal ligament lymphadenopathy. No intraperitoneal or retroperitoneal lymphadenopathy. No pelvic sidewall lymphadenopathy. Reproductive: Uterus is surgically absent. There is no adnexal mass. Other: Free fluid is identified around the liver and spleen. Interloop mesenteric fluid is identified in the central small bowel mesentery. Musculoskeletal: Bone windows reveal no worrisome lytic or sclerotic osseous lesions. IMPRESSION: 1. Mechanical small bowel obstruction with small bowel loops dilated up to 4.6 cm diameter and a definite transition zone identified in the right pelvis. Although there is some question of twisting in the region of the transition zone, there is no second transition zone identified nor dilated small bowel after the transition zone to suggest a closed loop obstruction. As such, imaging features likely related to an adhesion given the history of hysterectomy. There is fluid around the liver and spleen with interloop mesenteric fluid associated with the abnormal small bowel loops. No small bowel wall thickening or pneumatosis at this time to suggest overt bowel ischemia. 2. Probable airway impaction in both posterior lower lobes although not well assessed given motion artifact. Electronically Signed   By: Misty Stanley M.D.   On: 03/30/2017 21:51   Dg Chest Port 1 View  Result Date: 04/07/2017 CLINICAL DATA:  Vomiting with concern for aspiration pneumonitis EXAM: PORTABLE CHEST 1 VIEW COMPARISON:  April 04, 2017 FINDINGS: There is persistent interstitial pulmonary edema. There is consolidation in the left lower lobe with left pleural effusion stable. There is less airspace opacity in the right base compared to recent study. Small right pleural effusion is present. Heart is mildly enlarged with pulmonary venous hypertension. There is aortic atherosclerosis. No new opacity evident. No adenopathy. There is advanced arthropathy in the right shoulder, stable. IMPRESSION: Slightly less opacity  in the right base, likely slightly less pulmonary edema. Changes of congestive heart failure overall persist. Question superimposed pneumonia left lower lobe given the degree of consolidation. Stable left pleural effusion. There is aortic atherosclerosis. No evident. No new opacity evident. Advanced arthropathy right shoulder. Aortic Atherosclerosis (ICD10-I70.0). Electronically Signed   By: Lowella Grip III M.D.   On: 04/07/2017 08:25   Dg Chest Port 1 View  Result Date: 04/04/2017 CLINICAL DATA:  Acute on chronic diastolic heart failure. EXAM: PORTABLE CHEST 1 VIEW COMPARISON:  Radiograph 04/02/2017, 03/30/2017 FINDINGS: Stable heart size and mediastinal contours. Stable hazy opacity at both lung bases likely comminution of pleural fluid and atelectasis. Pulmonary edema with slight improvement from prior exam. Biapical pleuroparenchymal scarring again seen. No pneumothorax or new confluent airspace disease. Chronic change in the right shoulder. IMPRESSION: Slight improvement in congestive heart failure with mild decrease in pulmonary edema. Bilateral pleural effusions are unchanged. Electronically Signed   By: Threasa Beards  Ehinger M.D.   On: 04/04/2017 16:01   Dg Chest Port 1 View  Result Date: 04/02/2017 CLINICAL DATA:  Cough. EXAM: PORTABLE CHEST 1 VIEW COMPARISON:  03/30/2017 FINDINGS: 2 frontal radiographs. Nasogastric tube extends beyond the inferior aspect of the film. Patient rotated to the right. Midline trachea. Normal heart size. Layering bilateral pleural effusions are new or significantly increased. Greater on the right. No pneumothorax. progression of moderate interstitial edema. Development of bibasilar airspace disease. IMPRESSION: Worsened aeration, with new or increased moderate congestive heart failure. Development of bilateral pleural effusions and adjacent airspace disease, most likely atelectasis. Infection or aspiration cannot be excluded. Electronically Signed   By: Abigail Miyamoto M.D.    On: 04/02/2017 11:14   Dg Chest Portable 1 View  Result Date: 03/30/2017 CLINICAL DATA:  Nasogastric tube placement. Generalized abdominal pain and bloating, acute onset. Initial encounter. EXAM: PORTABLE CHEST 1 VIEW COMPARISON:  Chest radiograph performed 06/10/2016 FINDINGS: The patient's enteric tube is seen extending below the diaphragm. The lungs are hyperexpanded, with biapical scarring. Vascular congestion is noted, with increased interstitial markings. Mild interstitial edema cannot be excluded. No pleural effusion or pneumothorax is seen. The cardiomediastinal silhouette is borderline normal in size. No acute osseous abnormalities are seen. Mild degenerative change is noted at the glenohumeral joints bilaterally. IMPRESSION: 1. Enteric tube noted extending below the diaphragm. 2. Lungs hyperexpanded, with biapical scarring, likely reflecting COPD. Underlying vascular congestion, with increased interstitial markings. Mild interstitial edema cannot be excluded. Electronically Signed   By: Garald Balding M.D.   On: 03/30/2017 23:55   Dg Abd 2 Views  Result Date: 04/05/2017 CLINICAL DATA:  Vomiting with nausea EXAM: ABDOMEN - 2 VIEW COMPARISON:  04/03/2017 FINDINGS: Small bilateral effusions with dense left lower lobe consolidation and patchy infiltrate at the right base. No definite free air beneath the diaphragm. Some contrast present in the colon. Contrast opacifies dilated small bowel in the lower abdomen and pelvis, measuring up to 4 cm. IMPRESSION: Dilated loops of small bowel containing contrast within the lower abdomen and pelvis consistent with bowel obstruction. There is a small amount of contrast present in the colon. Small bilateral effusions and left greater than right bibasilar infiltrates Electronically Signed   By: Donavan Foil M.D.   On: 04/05/2017 21:07   Dg Abd 2 Views  Result Date: 04/03/2017 CLINICAL DATA:  Patient with history of small bowel obstruction. EXAM: ABDOMEN - 2 VIEW  COMPARISON:  Abdominal radiograph 04/01/2017 FINDINGS: Enteric tube tip and side-port project over the stomach. Oral contrast material within the colon. Stable gaseous distended loops of small bowel within the central abdomen. Heterogeneous opacities lung bases bilaterally. Small bilateral pleural effusions. IMPRESSION: Enteric tube tip and side-port project over the stomach. Unchanged gaseous distended loops of bowel within the central abdomen. Layering effusions and underlying opacities. Electronically Signed   By: Lovey Newcomer M.D.   On: 04/03/2017 09:13   Dg Abd 2 Views  Result Date: 04/01/2017 CLINICAL DATA:  Followup small bowel obstruction. EXAM: ABDOMEN - 2 VIEW COMPARISON:  03/31/2017 FINDINGS: There are persistent loops of dilated central small bowel with air-fluid levels consistent with a partial small bowel obstruction, similar to the prior study. There is no free air. Nasogastric tube extends below the diaphragm into the mid stomach. IMPRESSION: 1. Persistent partial small bowel obstruction. No significant change from the prior study. No free air. Electronically Signed   By: Lajean Manes M.D.   On: 04/01/2017 08:42   Dg Abd 2 Views  Result Date: 03/31/2017 CLINICAL DATA:  Follow-up small bowel obstruction. EXAM: ABDOMEN - 2 VIEW COMPARISON:  03/30/2017 abdominal CT FINDINGS: An NG tube is identified with tip overlying the proximal-mid stomach. Dilated small bowel loops are again noted and not significantly changed. There is no evidence of pneumoperitoneum. No other changes identified. IMPRESSION: Little significant change of dilated small bowel loops compatible with small bowel obstruction. No evidence of pneumoperitoneum. NG tube with tip overlying the proximal-mid stomach. Electronically Signed   By: Margarette Canada M.D.   On: 03/31/2017 21:12   Dg Swallowing Func-speech Pathology  Result Date: 04/05/2017 Objective Swallowing Evaluation: Type of Study: MBS-Modified Barium Swallow Study Patient  Details Name: AVANA KREISER MRN: [id number] Date of Birth: 07-Feb-1929 Today's Date: 04/05/2017 Time: SLP Start Time (ACUTE ONLY): 0805-SLP Stop Time (ACUTE ONLY): 0819 SLP Time Calculation (min) (ACUTE ONLY): 14 min Past Medical History: Past Medical History: Diagnosis Date . Aortic regurgitation  . Bronchiectasis ([place])  . COPD (chronic obstructive pulmonary disease) ([place])  . DJD (degenerative joint disease)  . HLD (hyperlipidemia)  . HTN (hypertension)  . Mild mitral regurgitation by prior echocardiogram  . Mild tricuspid regurgitation  . Palpitations   a. has known history of PAC's and PVC's. b. 09/2015: monitor showed sinus rhythm with occasional PAC's and a brief episode of PAT. [name] Thyroid disease  Past Surgical History: Past Surgical History: Procedure Laterality Date . ABDOMINAL HYSTERECTOMY  1993 . CATARACT EXTRACTION, BILATERAL   HPI: 83 year old female with a history of bronchiectasis/COPD, chronic respiratory failure on 2 L at night, hypertension, hyperlipidemia, diastolic CHF, hypothyroidism, and PATpresented with a one to two-day history of abdominal pain with associated nausea and vomiting. The patient denied any fevers, chills, chest pain, shortness breath, diarrhea, hematochezia, melena. CT of the abdomen and pelvis at the time of admission showed dilated small bowel loops up to 4.6 cm in the mid pelvis with a transition zone in the right pelvis. NG tube was inserted for decompression and Gen. surgery was consulted to assist with management. Overall, her bowel function has improved, and her NG tube was discontinued 7/8. Her diet was advanced which she tolerated. Her hospitalization has been Located by atrial tachycardia/atrial fibrillation, acute on chronic diastolic CHF, and aspiration pneumonia. Pt and family report continued swallowing difficulty. ST will evaluate and treat as indicated. No Data Recorded Assessment / Plan / Recommendation CHL IP CLINICAL IMPRESSIONS 04/05/2017 Clinical Impression Pt  presents with an inconsistent mild to moderate oropharyngeal dysphagia. Oral phase characterized by decreased bolus cohesion of solids and delayed oral transit. Swallow initiation delayed throughout PO trials, to the level of the valleculae with puree and solids, and to the level of the pyriform sinuses with thin and nectar thick liquids. Prominent vallecular residuals evidenced secondary to reduced tongue base retraction, which were increased with solids and thickened consistencies. Thin liquid alternation aided in clearing vallecular residuals.  Inconsistent penetration occured with thin liquids before the swallow that intermittently reached the level of the vocal cords and was cleared out of laryngeal vestibule intermittently. Volitional throat clear aided in expelling penetrates with cueing from SLP. Nectar thick liquids without penetration. As PO trials progressed pt did evidence cup and straw sips of thin liquids without penetration. Barium tablet administered with thin liquids. Pt displayed difficulty propelling tablet throughout pharynx, initially sticking in the valleculae, then the pyriform sinuses, ultimately requiring nectar thick liquids to clear through pharynx. As pt was struggling to pass pill throughout pharynx, silent aspiration of thin liquids occured after  the swallow. Recommend chopped consistencies with thin liquids in isolation. Pt advised to avoid mixed consistencies as airway protection appears compromised when two consistencies administered at the same time. Recommend medicines whole in puree (crush larger meds as needed). ST to follow up for diet tolerance and education with family for safe swallow strategies  SLP Visit Diagnosis Dysphagia, oropharyngeal phase (R13.12) Attention and concentration deficit following -- Frontal lobe and executive function deficit following -- Impact on safety and function Moderate aspiration risk   CHL IP TREATMENT RECOMMENDATION 04/05/2017 Treatment  Recommendations Therapy as outlined in treatment plan below   Prognosis 04/05/2017 Prognosis for Safe Diet Advancement Good Barriers to Reach Goals -- Barriers/Prognosis Comment -- CHL IP DIET RECOMMENDATION 04/05/2017 SLP Diet Recommendations Dysphagia 2 (Fine chop) solids;Thin liquid;No mixed consistencies Liquid Administration via -- Medication Administration Whole meds with puree Compensations Minimize environmental distractions;Slow rate;Small sips/bites;Multiple dry swallows after each bite/sip;Follow solids with liquid;Clear throat intermittently Postural Changes Remain semi-upright after after feeds/meals (Comment);Seated upright at 90 degrees   CHL IP OTHER RECOMMENDATIONS 04/05/2017 Recommended Consults -- Oral Care Recommendations Oral care BID Other Recommendations --   CHL IP FOLLOW UP RECOMMENDATIONS 04/05/2017 Follow up Recommendations 24 hour supervision/assistance   [facility] IP FREQUENCY AND DURATION 04/05/2017 Speech Therapy Frequency (ACUTE ONLY) min 2x/week Treatment Duration 2 weeks      [facility] IP ORAL PHASE 04/05/2017 Oral Phase Impaired Oral - Pudding Teaspoon -- Oral - Pudding Cup -- Oral - Honey Teaspoon -- Oral - Honey Cup -- Oral - Nectar Teaspoon Delayed oral transit;Lingual/palatal residue Oral - Nectar Cup Delayed oral transit;Lingual/palatal residue;Weak lingual manipulation Oral - Nectar Straw Weak lingual manipulation;Delayed oral transit;Lingual/palatal residue Oral - Thin Teaspoon Weak lingual manipulation;Delayed oral transit;Lingual/palatal residue Oral - Thin Cup Weak lingual manipulation;Delayed oral transit;Lingual/palatal residue Oral - Thin Straw Delayed oral transit;Weak lingual manipulation;Lingual/palatal residue Oral - Puree Delayed oral transit;Weak lingual manipulation;Lingual pumping Oral - Mech Soft -- Oral - Regular Lingual/palatal residue;Piecemeal swallowing;Delayed oral transit;Decreased bolus cohesion;Weak lingual manipulation Oral - Multi-Consistency -- Oral - Pill Lingual  pumping;Reduced posterior propulsion;Delayed oral transit;Piecemeal swallowing;Lingual/palatal residue Oral Phase - Comment --  [facility] IP PHARYNGEAL PHASE 04/05/2017 Pharyngeal Phase Impaired Pharyngeal- Pudding Teaspoon -- Pharyngeal -- Pharyngeal- Pudding Cup -- Pharyngeal -- Pharyngeal- Honey Teaspoon -- Pharyngeal -- Pharyngeal- Honey Cup -- Pharyngeal -- Pharyngeal- Nectar Teaspoon Reduced tongue base retraction;Delayed swallow initiation-vallecula;Delayed swallow initiation-pyriform sinuses;Pharyngeal residue - valleculae Pharyngeal -- Pharyngeal- Nectar Cup Delayed swallow initiation-vallecula;Delayed swallow initiation-pyriform sinuses;Reduced tongue base retraction;Pharyngeal residue - valleculae Pharyngeal -- Pharyngeal- Nectar Straw -- Pharyngeal -- Pharyngeal- Thin Teaspoon Delayed swallow initiation-vallecula;Penetration/Aspiration during swallow;Penetration/Aspiration before swallow;Pharyngeal residue - valleculae;Reduced tongue base retraction;Reduced epiglottic inversion Pharyngeal Material enters airway, CONTACTS cords and then ejected out Pharyngeal- Thin Cup Delayed swallow initiation-vallecula;Reduced tongue base retraction;Pharyngeal residue - valleculae;Penetration/Aspiration during swallow;Penetration/Aspiration before swallow Pharyngeal Material enters airway, CONTACTS cords and not ejected out Pharyngeal- Thin Straw Penetration/Aspiration during swallow;Penetration/Aspiration before swallow;Delayed swallow initiation-vallecula;Reduced tongue base retraction;Pharyngeal residue - valleculae Pharyngeal Material enters airway, remains ABOVE vocal cords and not ejected out Pharyngeal- Puree Delayed swallow initiation-vallecula;Pharyngeal residue - valleculae;Reduced tongue base retraction Pharyngeal -- Pharyngeal- Mechanical Soft -- Pharyngeal -- Pharyngeal- Regular Delayed swallow initiation-vallecula;Pharyngeal residue - valleculae;Reduced tongue base retraction Pharyngeal -- Pharyngeal-  Multi-consistency -- Pharyngeal -- Pharyngeal- Pill Delayed swallow initiation-vallecula;Other (Comment);Penetration/Apiration after swallow Pharyngeal Material enters airway, passes BELOW cords and not ejected out despite cough attempt by patient Pharyngeal Comment --  No flowsheet data found. Arvil Chaco MA, [place]  [place] 04/05/2017, 9:48 AM               Bethena Roys, MD [facility] Pager [phone]  If 7PM-7AM, please contact night-coverage [email or web address] Password [email or web address] 04/09/2017, 1:34 PM   LOS: 9 days

## 2017-04-10 LAB — BASIC METABOLIC PANEL
Anion gap: 10 (ref 5–15)
BUN: 21 mg/dL — AB (ref 6–20)
CALCIUM: 8.7 mg/dL — AB (ref 8.9–10.3)
CO2: 37 mmol/L — ABNORMAL HIGH (ref 22–32)
CREATININE: 0.92 mg/dL (ref 0.44–1.00)
Chloride: 99 mmol/L — ABNORMAL LOW (ref 101–111)
GFR calc Af Amer: >60 mL/min (ref >60)
GFR, EST NON AFRICAN AMERICAN: 55 mL/min — AB (ref >60)
Glucose, Bld: 112 mg/dL — ABNORMAL HIGH (ref 65–99)
Potassium: 4.2 mmol/L (ref 3.5–5.1)
SODIUM: 146 mmol/L — AB (ref 135–145)

## 2017-04-10 LAB — TROPONIN I: Troponin I: <0.03 ng/mL (ref <0.03)

## 2017-04-10 MED ORDER — METOPROLOL TARTRATE 5 MG/5ML IV SOLN
5.0000 mg | Freq: Once | INTRAVENOUS | Status: AC
  Administered 2017-04-10: 5 mg via INTRAVENOUS
  Filled 2017-04-10: qty 5

## 2017-04-10 MED ORDER — DILTIAZEM HCL 60 MG PO TABS
90.0000 mg | ORAL_TABLET | Freq: Four times a day (QID) | ORAL | Status: DC
  Administered 2017-04-10 – 2017-04-12 (×9): 90 mg via ORAL
  Filled 2017-04-10 (×9): qty 1

## 2017-04-10 MED ORDER — METOPROLOL TARTRATE 25 MG PO TABS
25.0000 mg | ORAL_TABLET | Freq: Two times a day (BID) | ORAL | Status: DC
  Administered 2017-04-10 – 2017-04-11 (×3): 25 mg via ORAL
  Filled 2017-04-10 (×3): qty 1

## 2017-04-10 NOTE — Progress Notes (Addendum)
PROGRESS NOTE  Andrea [name] EQU:[id number] DOB: [birth year] DOA: 03/30/2017 PCP: Unk Pinto, MD Brief History: 83 year old female with a history of bronchiectasis/COPD, chronic respiratory failure on 2 L at night, hypertension, hyperlipidemia, diastolic CHF, hypothyroidism, and PATpresented with a one to two-day history of abdominal pain with associated nausea and vomiting. The patient denied any fevers, chills, chest pain, shortness breath, diarrhea, hematochezia, melena. CT of the abdomen and pelvis at the time of admission showed dilated small bowel loops up to 4.6 cm in the mid pelvis with a transition zone in the right pelvis. NG tube was inserted for decompression and Gen. surgery was consulted to assist with management. Overall, her bowel function has improved, and her NG tube was discontinued. Her diet was advanced which she tolerated. Her hospitalization has been complicated by atrial tachycardia/SVT, acute on chronic diastolic CHF, and aspiration pneumonia.  Assessment/Plan: Summary- 83 year old female admitted and managed initially for small bowel obstruction, which has resolved. Hospitalization has been complicated by episodes of SVT requiring transfer to ICU with initiation of Cardizem drip, aspiration pneumonia thought to be related to vomiting from SBO, now not in A. fib with RVR- rates 120s to 150s.Patient awaiting SNF bed availability over the weekend, but 7/15, patient in persistent A. Fib. Discussed anticoagulation with family, differing decision to daughter present tomorrow. Cardiology consult placed. Patient ready for discharge pending cardiology recommendations and improvement in heart rate.  Small bowel obstruction- resolving. Likely due to adhesions. CT abdomen and pelvis with contrast 7/13- near-complete resolution of dilated small bowel loops since previous study.   - Gen. Surgery recs appreciated - continue with dysphagia 2 diet with thin liquids, following  speech therapy evaluation - PT eval- SNF recommended - Social work consulted, awaiting placement  SVT/ atrial flutter/atrial fibrillation- history of palpitations. Initially intermittent Episodes of SVT, and then atria fib started 7/15. Presently in atria fib rates- 120s- 150s. CHADVASC score at least 3. Talked to patient and spouse, about anticoagulation. They are requesting differing decision making to daughter who will be present tomorrow.  - Increase Cardizem dose to 90 mg every 6 hourly - Start low-dose metoprolol 25 twice a day - Iv metop 54m x1. -TSH--0.936 -04/22/2016 echo EF 60-65%, grade 2 DD, no WMA, PASP 55 -04/03/17 echo--EF 60-65%, no WMA, PASP 42 - Continue Reduce dose Synthroid 268m considering low TSH, and synthroid can provoke cardiac arrhythmias. - EKG 7/14-atrial flutter, A. fib on telemetry today.    - Trop x3- negative - Cardiology consult.  Aspiration pneumonia-  Chest xray- opacity right base, ? Superimposed pneumonia left lower lobe. No fever, no leukocytosis. CT abdomen- new small bilateral pleural effusions. L >R. Left lower lobe infiltrate versus atelectasis. -due to vomiting from SBO -Completed Unasyn X 7 day  Protein energy malnutrition, Anorexia-  - Mirtazapine - Nutritional consult appreciated - Ensure  Acute on Chronic diastolic CHF-  Sodium mildly up at 146. No IV fluids- she has mild pulmonary edema and pleural effusions. Encourage by mouth fluids. O2 sats is greater than 91% on room air. She is on 2 L of O2 at night at home. -  Resume home dose 2064mLasix , had contrast exposure for abdominal Ct,  -04/03/17 echo--EF 60-65%, no WMA, PASP 42, no remark on diastolic dysfunction.  COPD/bronchiectasis/chronic respiratory failure with hypoxia- stable -On 2 L nasal cannula nighttime at home - Continue duonebs  Hypokalemia/Hypomagnesemia--repleted  Essential hypertension -Holding bisoprolol/HCTZ initially due to SBO -Holding amlodipine  and losartan  initially due to SBO -hydralazine IV prn SBP >180 - Bp stable on new regimen of cardizem, will add low dose metop.  Anxiety -D/c IV ativan q 8 hrs prn, resume home xanax.  Hypothyroidism - D/c Iv, resume home synthroid  I Disposition Plan: Pending cards evaluation am, if rates controlle can d/c to SNF when bed available.   Family Communication: Family- significant other updated at bedside 7/15  Consultants: General surgery Cardiology  Code Status: FULL   DVT Prophylaxis: Mildred Lovenox  Procedures: As Listed in Progress Note Above  Antibiotics: Unasyn 04/03/17>>7/15.  Subjective: No complaints this morning. No chest pain or shortness of breath. She denies palpitations, or awareness of heartbeat. Spouse present at bedside. Discussed anticoagulation patient's spouse is on eliquis, so he understands the medication and the risk.   Objective: Vitals:   04/10/17 0551 04/10/17 0757 04/10/17 1047 04/10/17 1232  BP: (!) 130/56  129/74   Pulse: (!) 124  (!) 138 (!) (P) 128  Resp: 18  20   Temp: 97.8 F (36.6 C)  98.5 F (36.9 C)   TempSrc: Oral  Axillary   SpO2: 98% (!) 88% 91%   Weight: 44.2 kg (97 lb 8 oz)     Height:        Intake/Output Summary (Last 24 hours) at 04/10/17 1342 Last data filed at 04/10/17 0459  Gross per 24 hour  Intake              200 ml  Output                0 ml  Net              200 ml   Weight change: -0.474 kg (-1 lb 0.7 oz) Exam:   General:  Pt is alert, follows commands appropriately, not in acute distress, thin.  HEENT: No icterus, No thrush, No neck mass, [place]/AT  Cardiovascular: Irregular rate, S1/S2, no rubs, no gallops  Respiratory: no wheeze, normal work of breathing on 2 L O2  Abdomen: Soft/+BS, non tender, non distended, no guarding  Extremities: No LE edema  Data Reviewed: I have personally reviewed following labs and imaging studies.  Basic Metabolic Panel:  Recent Labs Lab 04/04/17 0502 04/05/17 0359  04/06/17 0448 04/07/17 0412 04/09/17 0630 04/10/17 0144  NA 147* 145 145 145 146* 146*  K 3.0* 3.5 3.6 3.4* 3.4* 4.2  CL 98* 98* 97* 96* 98* 99*  CO2 40* 40* 34* 41* 42* 37*  GLUCOSE 151* 123* 146* 115* 101* 112*  BUN 24* 25* 33* 34* 24* 21*  CREATININE 0.93 0.98 1.14* 1.04* 0.87 0.92  CALCIUM 8.4* 8.9 8.9 8.7* 8.5* 8.7*  MG 1.4* 1.9  --   --   --   --     Recent Labs Lab 04/04/17 0502 04/05/17 0359 04/06/17 0448  WBC 16.4* 10.4 8.5  NEUTROABS  --  8.8*  --   HGB 11.8* 12.3 12.4  HCT 38.2 39.7 40.7  MCV 97.0 97.1 99.3  PLT 192 190 218   CBG:  Recent Labs Lab 04/04/17 0815  GLUCAP 128*   Urine analysis:    Component Value Date/Time   COLORURINE YELLOW 03/30/2017 2130   APPEARANCEUR CLEAR 03/30/2017 2130   LABSPEC 1.015 03/30/2017 2130   PHURINE 7.0 03/30/2017 2130   GLUCOSEU NEGATIVE 03/30/2017 2130   HGBUR NEGATIVE 03/30/2017 2130   BILIRUBINUR NEGATIVE 03/30/2017 2130   KETONESUR 5 (A) 03/30/2017 2130   PROTEINUR 30 (A)  03/30/2017 2130   UROBILINOGEN 0.2 08/20/2014 1613   NITRITE NEGATIVE 03/30/2017 2130   LEUKOCYTESUR NEGATIVE 03/30/2017 2130   Sepsis Labs: _0 (procalcitonin:4,lacticidven:4) ) Recent Results (from the past 240 hour(s))  MRSA PCR Screening     Status: None   Collection Time: 04/03/17 10:39 AM  Result Value Ref Range Status   MRSA by PCR NEGATIVE NEGATIVE Final    Comment:        The GeneXpert MRSA Assay (FDA approved for NASAL specimens only), is one component of a comprehensive MRSA colonization surveillance program. It is not intended to diagnose MRSA infection nor to guide or monitor treatment for MRSA infections.      Scheduled Meds: . bisacodyl  10 mg Rectal q morning - 10a  . diltiazem  90 mg Oral Q6H  . enoxaparin (LOVENOX) injection  30 mg Subcutaneous Q24H  . feeding supplement (ENSURE ENLIVE)  237 mL Oral BID BM  . furosemide  20 mg Oral Daily  . ipratropium-albuterol  3 mL Nebulization Q8H  .  levothyroxine  25 mcg Oral QAC breakfast  . mouth rinse  15 mL Mouth Rinse BID  . mirtazapine  7.5 mg Oral QHS  . pantoprazole (PROTONIX) IV  40 mg Intravenous Q24H  . potassium chloride  40 mEq Oral BID  . senna-docusate  1 tablet Oral QHS  . simethicone  80 mg Oral BID   Continuous Infusions: . ampicillin-sulbactam (UNASYN) IV Stopped (04/10/17 0443)    Procedures/Studies: Dg Abd 1 View  Result Date: 04/01/2017 CLINICAL DATA:  NG tube position. EXAM: ABDOMEN - 1 VIEW COMPARISON:  04/01/2017 FINDINGS: NG tube tip is in the mid stomach with the side port in the proximal stomach. IMPRESSION: NG tube tip in the mid stomach. Electronically Signed   By: Rolm Baptise M.D.   On: 04/01/2017 11:49   Ct Abdomen Pelvis W Contrast  Result Date: 04/08/2017 CLINICAL DATA:  Abdominal pain with nausea and vomiting. Small bowel obstruction. EXAM: CT ABDOMEN AND PELVIS WITH CONTRAST TECHNIQUE: Multidetector CT imaging of the abdomen and pelvis was performed using the standard protocol following bolus administration of intravenous contrast. CONTRAST:  69m ISOVUE-300 IOPAMIDOL (ISOVUE-300) INJECTION 61% COMPARISON:  03/30/2017 FINDINGS: Lower Chest: New small pleural effusions, left side greater than right. New left lower lobe atelectasis versus infiltrate. Mild compressive atelectasis in right lung base. Hepatobiliary: 1 cm homogeneous hypervascular lesion in the anterior left hepatic lobe shows no significant change since recent study. This most likely represents a tiny flash-filling hemangioma. Tiny sub-cm cyst again seen in the posterior left hepatic lobe. Gallbladder is unremarkable. Pancreas:  No mass or inflammatory changes. Spleen: Within normal limits in size and appearance. Adrenals/Urinary Tract: No masses identified. Tiny bilateral renal cysts again noted. No evidence of hydronephrosis. Stomach/Bowel: There has been near complete resolution of dilated bowel loops since prior study. Diverticulosis again  seen involving the descending and sigmoid colon, however there is no evidence of diverticulitis. Vascular/Lymphatic: No pathologically enlarged lymph nodes. No abdominal aortic aneurysm. Aortic atherosclerosis. Reproductive: Prior hysterectomy noted. Adnexal regions are unremarkable in appearance. Other:  None. Musculoskeletal: No suspicious bone lesions identified. Advanced lumbar spondylosis and moderate dextroscoliosis again noted. IMPRESSION: Near complete resolution of dilated small bowel loops since previous study. Colonic diverticulosis, without radiographic evidence of diverticulitis. New small bilateral pleural effusions, left side greater than right, and left lower lobe atelectasis versus infiltrate. Aortic atherosclerosis. Electronically Signed   By: JEarle GellM.D.   On: 04/08/2017 09:40   Ct Abdomen Pelvis  W Contrast  Result Date: 03/30/2017 CLINICAL DATA:  Abdominal pain with bloating.  Nausea and vomiting. EXAM: CT ABDOMEN AND PELVIS WITH CONTRAST TECHNIQUE: Multidetector CT imaging of the abdomen and pelvis was performed using the standard protocol following bolus administration of intravenous contrast. CONTRAST:  45m ISOVUE-300 IOPAMIDOL (ISOVUE-300) INJECTION 61%, 1038mISOVUE-300 IOPAMIDOL (ISOVUE-300) INJECTION 61% COMPARISON:  08/15/2007 FINDINGS: Lower chest: Tubular branching structures posterior lung bases suggest impacted airways but difficult to assess given the substantial breathing motion artifact. Hepatobiliary: 7 mm hypervascular lesion subcapsular left liver cannot be further characterized. Mild intrahepatic biliary duct prominence. Gallbladder unremarkable. Extrahepatic common bile duct measures 8 mm diameter, increased in the interval. Pancreas: No focal mass lesion. No dilatation of the main duct. No intraparenchymal cyst. No peripancreatic edema. Spleen: No splenomegaly. No focal mass lesion. Adrenals/Urinary Tract: No adrenal nodule or mass. Cortical scarring noted both  kidneys with small renal cysts noted bilaterally. No evidence for hydroureter. Bladder is distended. Stomach/Bowel: Stomach is mildly distended. Duodenum unremarkable. Small bowel loops in the lower abdomen and pelvis are fluid-filled and dilated. A prominently dilated loop in the mid pelvis measures up to 4.6 cm diameter. This dilated loop pin tracts down to the pelvis where a discrete transition point is identified in the right pelvis (see image 53 series 2 and also visible on sagittal image 52 of series 6). I cannot identify dilated small bowel beyond this transition zone to suggest a closed loop etiology. The terminal ileum is collapsed. The appendix is not visualized, but there is no edema or inflammation in the region of the cecum. Diverticuli are seen scattered along the entire length of the colon without CT findings of diverticulitis. Vascular/Lymphatic: There is abdominal aortic atherosclerosis without aneurysm. There is no gastrohepatic or hepatoduodenal ligament lymphadenopathy. No intraperitoneal or retroperitoneal lymphadenopathy. No pelvic sidewall lymphadenopathy. Reproductive: Uterus is surgically absent. There is no adnexal mass. Other: Free fluid is identified around the liver and spleen. Interloop mesenteric fluid is identified in the central small bowel mesentery. Musculoskeletal: Bone windows reveal no worrisome lytic or sclerotic osseous lesions. IMPRESSION: 1. Mechanical small bowel obstruction with small bowel loops dilated up to 4.6 cm diameter and a definite transition zone identified in the right pelvis. Although there is some question of twisting in the region of the transition zone, there is no second transition zone identified nor dilated small bowel after the transition zone to suggest a closed loop obstruction. As such, imaging features likely related to an adhesion given the history of hysterectomy. There is fluid around the liver and spleen with interloop mesenteric fluid associated  with the abnormal small bowel loops. No small bowel wall thickening or pneumatosis at this time to suggest overt bowel ischemia. 2. Probable airway impaction in both posterior lower lobes although not well assessed given motion artifact. Electronically Signed   By: ErMisty Stanley.D.   On: 03/30/2017 21:51   Dg Chest Port 1 View  Result Date: 04/07/2017 CLINICAL DATA:  Vomiting with concern for aspiration pneumonitis EXAM: PORTABLE CHEST 1 VIEW COMPARISON:  April 04, 2017 FINDINGS: There is persistent interstitial pulmonary edema. There is consolidation in the left lower lobe with left pleural effusion stable. There is less airspace opacity in the right base compared to recent study. Small right pleural effusion is present. Heart is mildly enlarged with pulmonary venous hypertension. There is aortic atherosclerosis. No new opacity evident. No adenopathy. There is advanced arthropathy in the right shoulder, stable. IMPRESSION: Slightly less opacity in the right base,  likely slightly less pulmonary edema. Changes of congestive heart failure overall persist. Question superimposed pneumonia left lower lobe given the degree of consolidation. Stable left pleural effusion. There is aortic atherosclerosis. No evident. No new opacity evident. Advanced arthropathy right shoulder. Aortic Atherosclerosis (ICD10-I70.0). Electronically Signed   By: Lowella Grip III M.D.   On: 04/07/2017 08:25   Dg Chest Port 1 View  Result Date: 04/04/2017 CLINICAL DATA:  Acute on chronic diastolic heart failure. EXAM: PORTABLE CHEST 1 VIEW COMPARISON:  Radiograph 04/02/2017, 03/30/2017 FINDINGS: Stable heart size and mediastinal contours. Stable hazy opacity at both lung bases likely comminution of pleural fluid and atelectasis. Pulmonary edema with slight improvement from prior exam. Biapical pleuroparenchymal scarring again seen. No pneumothorax or new confluent airspace disease. Chronic change in the right shoulder. IMPRESSION:  Slight improvement in congestive heart failure with mild decrease in pulmonary edema. Bilateral pleural effusions are unchanged. Electronically Signed   By: Jeb Levering M.D.   On: 04/04/2017 16:01   Dg Chest Port 1 View  Result Date: 04/02/2017 CLINICAL DATA:  Cough. EXAM: PORTABLE CHEST 1 VIEW COMPARISON:  03/30/2017 FINDINGS: 2 frontal radiographs. Nasogastric tube extends beyond the inferior aspect of the film. Patient rotated to the right. Midline trachea. Normal heart size. Layering bilateral pleural effusions are new or significantly increased. Greater on the right. No pneumothorax. progression of moderate interstitial edema. Development of bibasilar airspace disease. IMPRESSION: Worsened aeration, with new or increased moderate congestive heart failure. Development of bilateral pleural effusions and adjacent airspace disease, most likely atelectasis. Infection or aspiration cannot be excluded. Electronically Signed   By: Abigail Miyamoto M.D.   On: 04/02/2017 11:14   Dg Chest Portable 1 View  Result Date: 03/30/2017 CLINICAL DATA:  Nasogastric tube placement. Generalized abdominal pain and bloating, acute onset. Initial encounter. EXAM: PORTABLE CHEST 1 VIEW COMPARISON:  Chest radiograph performed 06/10/2016 FINDINGS: The patient's enteric tube is seen extending below the diaphragm. The lungs are hyperexpanded, with biapical scarring. Vascular congestion is noted, with increased interstitial markings. Mild interstitial edema cannot be excluded. No pleural effusion or pneumothorax is seen. The cardiomediastinal silhouette is borderline normal in size. No acute osseous abnormalities are seen. Mild degenerative change is noted at the glenohumeral joints bilaterally. IMPRESSION: 1. Enteric tube noted extending below the diaphragm. 2. Lungs hyperexpanded, with biapical scarring, likely reflecting COPD. Underlying vascular congestion, with increased interstitial markings. Mild interstitial edema cannot be  excluded. Electronically Signed   By: Garald Balding M.D.   On: 03/30/2017 23:55   Dg Abd 2 Views  Result Date: 04/05/2017 CLINICAL DATA:  Vomiting with nausea EXAM: ABDOMEN - 2 VIEW COMPARISON:  04/03/2017 FINDINGS: Small bilateral effusions with dense left lower lobe consolidation and patchy infiltrate at the right base. No definite free air beneath the diaphragm. Some contrast present in the colon. Contrast opacifies dilated small bowel in the lower abdomen and pelvis, measuring up to 4 cm. IMPRESSION: Dilated loops of small bowel containing contrast within the lower abdomen and pelvis consistent with bowel obstruction. There is a small amount of contrast present in the colon. Small bilateral effusions and left greater than right bibasilar infiltrates Electronically Signed   By: Donavan Foil M.D.   On: 04/05/2017 21:07   Dg Abd 2 Views  Result Date: 04/03/2017 CLINICAL DATA:  Patient with history of small bowel obstruction. EXAM: ABDOMEN - 2 VIEW COMPARISON:  Abdominal radiograph 04/01/2017 FINDINGS: Enteric tube tip and side-port project over the stomach. Oral contrast material within the colon. Stable  gaseous distended loops of small bowel within the central abdomen. Heterogeneous opacities lung bases bilaterally. Small bilateral pleural effusions. IMPRESSION: Enteric tube tip and side-port project over the stomach. Unchanged gaseous distended loops of bowel within the central abdomen. Layering effusions and underlying opacities. Electronically Signed   By: Lovey Newcomer M.D.   On: 04/03/2017 09:13   Dg Abd 2 Views  Result Date: 04/01/2017 CLINICAL DATA:  Followup small bowel obstruction. EXAM: ABDOMEN - 2 VIEW COMPARISON:  03/31/2017 FINDINGS: There are persistent loops of dilated central small bowel with air-fluid levels consistent with a partial small bowel obstruction, similar to the prior study. There is no free air. Nasogastric tube extends below the diaphragm into the mid stomach. IMPRESSION: 1.  Persistent partial small bowel obstruction. No significant change from the prior study. No free air. Electronically Signed   By: Lajean Manes M.D.   On: 04/01/2017 08:42   Dg Abd 2 Views  Result Date: 03/31/2017 CLINICAL DATA:  Follow-up small bowel obstruction. EXAM: ABDOMEN - 2 VIEW COMPARISON:  03/30/2017 abdominal CT FINDINGS: An NG tube is identified with tip overlying the proximal-mid stomach. Dilated small bowel loops are again noted and not significantly changed. There is no evidence of pneumoperitoneum. No other changes identified. IMPRESSION: Little significant change of dilated small bowel loops compatible with small bowel obstruction. No evidence of pneumoperitoneum. NG tube with tip overlying the proximal-mid stomach. Electronically Signed   By: Margarette Canada M.D.   On: 03/31/2017 21:12   Dg Swallowing Func-speech Pathology  Result Date: 04/05/2017 Objective Swallowing Evaluation: Type of Study: MBS-Modified Barium Swallow Study Patient Details Name: HENLI HEY MRN: [id number] Date of Birth: 12-06-1928 Today's Date: 04/05/2017 Time: SLP Start Time (ACUTE ONLY): 0805-SLP Stop Time (ACUTE ONLY): 0819 SLP Time Calculation (min) (ACUTE ONLY): 14 min Past Medical History: Past Medical History: Diagnosis Date . Aortic regurgitation  . Bronchiectasis ([place])  . COPD (chronic obstructive pulmonary disease) ([place])  . DJD (degenerative joint disease)  . HLD (hyperlipidemia)  . HTN (hypertension)  . Mild mitral regurgitation by prior echocardiogram  . Mild tricuspid regurgitation  . Palpitations   a. has known history of PAC's and PVC's. b. 09/2015: monitor showed sinus rhythm with occasional PAC's and a brief episode of PAT. [name] Thyroid disease  Past Surgical History: Past Surgical History: Procedure Laterality Date . ABDOMINAL HYSTERECTOMY  1993 . CATARACT EXTRACTION, BILATERAL   HPI: 83 year old female with a history of bronchiectasis/COPD, chronic respiratory failure on 2 L at night, hypertension,  hyperlipidemia, diastolic CHF, hypothyroidism, and PATpresented with a one to two-day history of abdominal pain with associated nausea and vomiting. The patient denied any fevers, chills, chest pain, shortness breath, diarrhea, hematochezia, melena. CT of the abdomen and pelvis at the time of admission showed dilated small bowel loops up to 4.6 cm in the mid pelvis with a transition zone in the right pelvis. NG tube was inserted for decompression and Gen. surgery was consulted to assist with management. Overall, her bowel function has improved, and her NG tube was discontinued 7/8. Her diet was advanced which she tolerated. Her hospitalization has been Located by atrial tachycardia/atrial fibrillation, acute on chronic diastolic CHF, and aspiration pneumonia. Pt and family report continued swallowing difficulty. ST will evaluate and treat as indicated. No Data Recorded Assessment / Plan / Recommendation CHL IP CLINICAL IMPRESSIONS 04/05/2017 Clinical Impression Pt presents with an inconsistent mild to moderate oropharyngeal dysphagia. Oral phase characterized by decreased bolus cohesion of solids and delayed oral transit. Swallow  initiation delayed throughout PO trials, to the level of the valleculae with puree and solids, and to the level of the pyriform sinuses with thin and nectar thick liquids. Prominent vallecular residuals evidenced secondary to reduced tongue base retraction, which were increased with solids and thickened consistencies. Thin liquid alternation aided in clearing vallecular residuals.  Inconsistent penetration occured with thin liquids before the swallow that intermittently reached the level of the vocal cords and was cleared out of laryngeal vestibule intermittently. Volitional throat clear aided in expelling penetrates with cueing from SLP. Nectar thick liquids without penetration. As PO trials progressed pt did evidence cup and straw sips of thin liquids without penetration. Barium tablet  administered with thin liquids. Pt displayed difficulty propelling tablet throughout pharynx, initially sticking in the valleculae, then the pyriform sinuses, ultimately requiring nectar thick liquids to clear through pharynx. As pt was struggling to pass pill throughout pharynx, silent aspiration of thin liquids occured after the swallow. Recommend chopped consistencies with thin liquids in isolation. Pt advised to avoid mixed consistencies as airway protection appears compromised when two consistencies administered at the same time. Recommend medicines whole in puree (crush larger meds as needed). ST to follow up for diet tolerance and education with family for safe swallow strategies  SLP Visit Diagnosis Dysphagia, oropharyngeal phase (R13.12) Attention and concentration deficit following -- Frontal lobe and executive function deficit following -- Impact on safety and function Moderate aspiration risk   [facility] IP TREATMENT RECOMMENDATION 04/05/2017 Treatment Recommendations Therapy as outlined in treatment plan below   Prognosis 04/05/2017 Prognosis for Safe Diet Advancement Good Barriers to Reach Goals -- Barriers/Prognosis Comment -- [facility] IP DIET RECOMMENDATION 04/05/2017 SLP Diet Recommendations Dysphagia 2 (Fine chop) solids;Thin liquid;No mixed consistencies Liquid Administration via -- Medication Administration Whole meds with puree Compensations Minimize environmental distractions;Slow rate;Small sips/bites;Multiple dry swallows after each bite/sip;Follow solids with liquid;Clear throat intermittently Postural Changes Remain semi-upright after after feeds/meals (Comment);Seated upright at 90 degrees   [facility] IP OTHER RECOMMENDATIONS 04/05/2017 Recommended Consults -- Oral Care Recommendations Oral care BID Other Recommendations --   [facility] IP FOLLOW UP RECOMMENDATIONS 04/05/2017 Follow up Recommendations 24 hour supervision/assistance   [facility] IP FREQUENCY AND DURATION 04/05/2017 Speech Therapy Frequency (ACUTE ONLY) min  2x/week Treatment Duration 2 weeks      [facility] IP ORAL PHASE 04/05/2017 Oral Phase Impaired Oral - Pudding Teaspoon -- Oral - Pudding Cup -- Oral - Honey Teaspoon -- Oral - Honey Cup -- Oral - Nectar Teaspoon Delayed oral transit;Lingual/palatal residue Oral - Nectar Cup Delayed oral transit;Lingual/palatal residue;Weak lingual manipulation Oral - Nectar Straw Weak lingual manipulation;Delayed oral transit;Lingual/palatal residue Oral - Thin Teaspoon Weak lingual manipulation;Delayed oral transit;Lingual/palatal residue Oral - Thin Cup Weak lingual manipulation;Delayed oral transit;Lingual/palatal residue Oral - Thin Straw Delayed oral transit;Weak lingual manipulation;Lingual/palatal residue Oral - Puree Delayed oral transit;Weak lingual manipulation;Lingual pumping Oral - Mech Soft -- Oral - Regular Lingual/palatal residue;Piecemeal swallowing;Delayed oral transit;Decreased bolus cohesion;Weak lingual manipulation Oral - Multi-Consistency -- Oral - Pill Lingual pumping;Reduced posterior propulsion;Delayed oral transit;Piecemeal swallowing;Lingual/palatal residue Oral Phase - Comment --  [facility] IP PHARYNGEAL PHASE 04/05/2017 Pharyngeal Phase Impaired Pharyngeal- Pudding Teaspoon -- Pharyngeal -- Pharyngeal- Pudding Cup -- Pharyngeal -- Pharyngeal- Honey Teaspoon -- Pharyngeal -- Pharyngeal- Honey Cup -- Pharyngeal -- Pharyngeal- Nectar Teaspoon Reduced tongue base retraction;Delayed swallow initiation-vallecula;Delayed swallow initiation-pyriform sinuses;Pharyngeal residue - valleculae Pharyngeal -- Pharyngeal- Nectar Cup Delayed swallow initiation-vallecula;Delayed swallow initiation-pyriform sinuses;Reduced tongue base retraction;Pharyngeal residue - valleculae Pharyngeal -- Pharyngeal- Nectar Straw -- Pharyngeal -- Pharyngeal- Thin Teaspoon  Delayed swallow initiation-vallecula;Penetration/Aspiration during swallow;Penetration/Aspiration before swallow;Pharyngeal residue - valleculae;Reduced tongue base  retraction;Reduced epiglottic inversion Pharyngeal Material enters airway, CONTACTS cords and then ejected out Pharyngeal- Thin Cup Delayed swallow initiation-vallecula;Reduced tongue base retraction;Pharyngeal residue - valleculae;Penetration/Aspiration during swallow;Penetration/Aspiration before swallow Pharyngeal Material enters airway, CONTACTS cords and not ejected out Pharyngeal- Thin Straw Penetration/Aspiration during swallow;Penetration/Aspiration before swallow;Delayed swallow initiation-vallecula;Reduced tongue base retraction;Pharyngeal residue - valleculae Pharyngeal Material enters airway, remains ABOVE vocal cords and not ejected out Pharyngeal- Puree Delayed swallow initiation-vallecula;Pharyngeal residue - valleculae;Reduced tongue base retraction Pharyngeal -- Pharyngeal- Mechanical Soft -- Pharyngeal -- Pharyngeal- Regular Delayed swallow initiation-vallecula;Pharyngeal residue - valleculae;Reduced tongue base retraction Pharyngeal -- Pharyngeal- Multi-consistency -- Pharyngeal -- Pharyngeal- Pill Delayed swallow initiation-vallecula;Other (Comment);Penetration/Apiration after swallow Pharyngeal Material enters airway, passes BELOW cords and not ejected out despite cough attempt by patient Pharyngeal Comment --  No flowsheet data found. Arvil Chaco MA, CCC-SLP Acute Care Speech Language Pathologist  Levi Aland 04/05/2017, 9:48 AM               Bethena Roys, MD [facility] Pager [phone]  If 7PM-7AM, please contact night-coverage [email or web address] Password [email or web address] 04/10/2017, 1:42 PM   LOS: 10 days

## 2017-04-11 ENCOUNTER — Telehealth: Payer: Self-pay | Admitting: Cardiology

## 2017-04-11 DIAGNOSIS — E43 Unspecified severe protein-calorie malnutrition: Secondary | ICD-10-CM

## 2017-04-11 MED ORDER — METOPROLOL TARTRATE 25 MG PO TABS
37.5000 mg | ORAL_TABLET | Freq: Two times a day (BID) | ORAL | Status: DC
  Administered 2017-04-11 – 2017-04-12 (×2): 37.5 mg via ORAL
  Filled 2017-04-11 (×2): qty 2

## 2017-04-11 NOTE — Progress Notes (Signed)
Progress Note    Andrea [name]  UGQ:[id number] DOB: 09/18/1929  DOA: 03/30/2017 PCP: Unk Pinto, MD    Brief Narrative:   Chief complaint: Follow-up SVT/atrial fibrillation  Medical records reviewed and are as summarized below:  Andrea [name] is an 83 y.o. female with a history of bronchiectasis/COPD, chronic respiratory failure on 2 L at night, hypertension, hyperlipidemia, diastolic CHF, hypothyroidism, and PATpresented with a one to two-day history of abdominal pain with associated nausea and vomiting. The patient denied any fevers, chills, chest pain, shortness breath, diarrhea, hematochezia, melena. CT of the abdomen and pelvis at the time of admission showed dilated small bowel loops up to 4.6 cm in the mid pelvis with a transition zone in the right pelvis. NG tube was inserted for decompression and Gen. surgery was consulted to assist with management. Overall, her bowel function has improved, and her NG tube was discontinued. Her diet was advanced which she tolerated. Her hospitalization has been complicated by atrial tachycardia/SVT, acute on chronic diastolic CHF, and aspiration pneumonia.  Assessment/Plan: Summary- 83 year old female admitted and managed initially for small bowel obstruction, which has resolved. Hospitalization has been complicated by episodes of SVT requiring transfer to ICU with initiation of Cardizem drip, aspiration pneumonia thought to be related to vomiting from SBO, now not in A. fib with RVR- rates 120s to 150s.Patient awaiting SNF bed availability over the weekend, but 7/15, patient in persistent A. Fib. Discussed anticoagulation with family, differing decision to daughter present tomorrow. Cardiology consult placed. Patient ready for discharge pending cardiology recommendations and improvement in heart rate.  Assessment/Plan:   Principal problem:  Small bowel obstruction Essentially resolved. CT of the abdomen done 04/08/17 showed near complete  resolution of dilated small bowel loops from prior study. Continue dysphagia 2 diet.  Active problems:  SVT/ atrial flutter/atrial fibrillation CHADSVASc=4. Blood thinners recommended by cardiologist however the patient has had reservations. Troponins negative 3. TSH WNL. Normal EF on 2-D echocardiogram. Continue Cardizem and metoprolol. Heart sounds continue to be irregular and tachycardic at times.  Aspiration pneumonia- Left greater than right infiltrate noted on imaging studies. Likely aspiration given recent SBO with vomiting. She has completed a seven-day course of Unasyn.  Protein energy malnutrition/Anorexia  Continue nutritional supplements and mirtazapine for appetite stimulation. Body mass index is 17.08 kg/m.  Acute on Chronic diastolic CHF Likely triggered by rapid A. fib. Normal EF on most recent echo. Lungs currently clear. Continue supplemental oxygen and Lasix. Appears to be compensated at present.  COPD/bronchiectasis/chronic respiratory failure with hypoxia- stable Patient remains on supplemental oxygen and respiratory status is currently stable.  Hypokalemia/Hypomagnesemia Monitor electrolytes and replete as needed.   Essential hypertension Continue Cardizem and metoprolol. Blood pressure controlled.  Anxiety Continue home dose of Xanax.  Hypothyroidism Continue home dose of Synthroid.   Family Communication/Anticipated D/C date and plan/Code Status   DVT prophylaxis: Lovenox ordered. Code Status: Full Code.  Family Communication: No family at the bedside. Disposition Plan: SNF when stable.   Medical Consultants:    Cardiology: Dr. Dorris Carnes   Anti-Infectives:    None  Subjective:   Denies shortness of breath, chest pain, nausea/vomiting.  Objective:    Vitals:   04/10/17 2258 04/10/17 2350 04/11/17 0614 04/11/17 0743  BP: 133/69  130/69   Pulse: (!) 104  (!) 102   Resp: 20  18   Temp: 98.3 F (36.8 C)  98.4 F (36.9 C)    TempSrc: Oral  Oral   SpO2:  97% 96% 98% 97%  Weight:   43.7 kg (96 lb 6.4 oz)   Height:       No intake or output data in the 24 hours ending 04/11/17 0953 Filed Weights   04/09/17 0202 04/10/17 0551 04/11/17 0614  Weight: 44.7 kg (98 lb 8.7 oz) 44.2 kg (97 lb 8 oz) 43.7 kg (96 lb 6.4 oz)    Exam: General exam: Cachectic female in no acute distress. Respiratory system: Lung sounds are diminished. No rales, occasional soft rhonchi left lower base. Cardiovascular system: Heart sounds are irregularly irregular and tachycardic. Gastrointestinal system: Abdomen is soft, non-tender, non-distended with normal bowel sounds. Central nervous system: Alert, non-focal. Extremities: No clubbing, edema or cyanosis. Skin: Warm and dry, scattered ecchymosis.  Psychiatry: Mood and affect normal. Insight/judgement impaired.  Data Reviewed:   I have personally reviewed following labs and imaging studies:  Labs: Labs show the following: Sodium 146, K 4.2, Cl 99, CO2 37, glucose 112, BUN 21, creatinine 0.92. CBC WNL. Troponins negative x 3. MRSA screen negative.   Procedures and diagnostic studies:  Dg Abd 1 View  Result Date: 04/01/2017 CLINICAL DATA:  NG tube position. EXAM: ABDOMEN - 1 VIEW COMPARISON:  04/01/2017 FINDINGS: NG tube tip is in the mid stomach with the side port in the proximal stomach. IMPRESSION: NG tube tip in the mid stomach. Electronically Signed   By: Rolm Baptise M.D.   On: 04/01/2017 11:49   Ct Abdomen Pelvis W Contrast  Result Date: 04/08/2017 CLINICAL DATA:  Abdominal pain with nausea and vomiting. Small bowel obstruction. EXAM: CT ABDOMEN AND PELVIS WITH CONTRAST TECHNIQUE: Multidetector CT imaging of the abdomen and pelvis was performed using the standard protocol following bolus administration of intravenous contrast. CONTRAST:  71m ISOVUE-300 IOPAMIDOL (ISOVUE-300) INJECTION 61% COMPARISON:  03/30/2017 FINDINGS: Lower Chest: New small pleural effusions, left side  greater than right. New left lower lobe atelectasis versus infiltrate. Mild compressive atelectasis in right lung base. Hepatobiliary: 1 cm homogeneous hypervascular lesion in the anterior left hepatic lobe shows no significant change since recent study. This most likely represents a tiny flash-filling hemangioma. Tiny sub-cm cyst again seen in the posterior left hepatic lobe. Gallbladder is unremarkable. Pancreas:  No mass or inflammatory changes. Spleen: Within normal limits in size and appearance. Adrenals/Urinary Tract: No masses identified. Tiny bilateral renal cysts again noted. No evidence of hydronephrosis. Stomach/Bowel: There has been near complete resolution of dilated bowel loops since prior study. Diverticulosis again seen involving the descending and sigmoid colon, however there is no evidence of diverticulitis. Vascular/Lymphatic: No pathologically enlarged lymph nodes. No abdominal aortic aneurysm. Aortic atherosclerosis. Reproductive: Prior hysterectomy noted. Adnexal regions are unremarkable in appearance. Other:  None. Musculoskeletal: No suspicious bone lesions identified. Advanced lumbar spondylosis and moderate dextroscoliosis again noted. IMPRESSION: Near complete resolution of dilated small bowel loops since previous study. Colonic diverticulosis, without radiographic evidence of diverticulitis. New small bilateral pleural effusions, left side greater than right, and left lower lobe atelectasis versus infiltrate. Aortic atherosclerosis. Electronically Signed   By: JEarle GellM.D.   On: 04/08/2017 09:40   Ct Abdomen Pelvis W Contrast  Result Date: 03/30/2017 CLINICAL DATA:  Abdominal pain with bloating.  Nausea and vomiting. EXAM: CT ABDOMEN AND PELVIS WITH CONTRAST TECHNIQUE: Multidetector CT imaging of the abdomen and pelvis was performed using the standard protocol following bolus administration of intravenous contrast. CONTRAST:  311mISOVUE-300 IOPAMIDOL (ISOVUE-300) INJECTION 61%,  10074mSOVUE-300 IOPAMIDOL (ISOVUE-300) INJECTION 61% COMPARISON:  08/15/2007 FINDINGS: Lower chest: Tubular  branching structures posterior lung bases suggest impacted airways but difficult to assess given the substantial breathing motion artifact. Hepatobiliary: 7 mm hypervascular lesion subcapsular left liver cannot be further characterized. Mild intrahepatic biliary duct prominence. Gallbladder unremarkable. Extrahepatic common bile duct measures 8 mm diameter, increased in the interval. Pancreas: No focal mass lesion. No dilatation of the main duct. No intraparenchymal cyst. No peripancreatic edema. Spleen: No splenomegaly. No focal mass lesion. Adrenals/Urinary Tract: No adrenal nodule or mass. Cortical scarring noted both kidneys with small renal cysts noted bilaterally. No evidence for hydroureter. Bladder is distended. Stomach/Bowel: Stomach is mildly distended. Duodenum unremarkable. Small bowel loops in the lower abdomen and pelvis are fluid-filled and dilated. A prominently dilated loop in the mid pelvis measures up to 4.6 cm diameter. This dilated loop pin tracts down to the pelvis where a discrete transition point is identified in the right pelvis (see image 53 series 2 and also visible on sagittal image 52 of series 6). I cannot identify dilated small bowel beyond this transition zone to suggest a closed loop etiology. The terminal ileum is collapsed. The appendix is not visualized, but there is no edema or inflammation in the region of the cecum. Diverticuli are seen scattered along the entire length of the colon without CT findings of diverticulitis. Vascular/Lymphatic: There is abdominal aortic atherosclerosis without aneurysm. There is no gastrohepatic or hepatoduodenal ligament lymphadenopathy. No intraperitoneal or retroperitoneal lymphadenopathy. No pelvic sidewall lymphadenopathy. Reproductive: Uterus is surgically absent. There is no adnexal mass. Other: Free fluid is identified around the  liver and spleen. Interloop mesenteric fluid is identified in the central small bowel mesentery. Musculoskeletal: Bone windows reveal no worrisome lytic or sclerotic osseous lesions. IMPRESSION: 1. Mechanical small bowel obstruction with small bowel loops dilated up to 4.6 cm diameter and a definite transition zone identified in the right pelvis. Although there is some question of twisting in the region of the transition zone, there is no second transition zone identified nor dilated small bowel after the transition zone to suggest a closed loop obstruction. As such, imaging features likely related to an adhesion given the history of hysterectomy. There is fluid around the liver and spleen with interloop mesenteric fluid associated with the abnormal small bowel loops. No small bowel wall thickening or pneumatosis at this time to suggest overt bowel ischemia. 2. Probable airway impaction in both posterior lower lobes although not well assessed given motion artifact. Electronically Signed   By: Misty Stanley M.D.   On: 03/30/2017 21:51   Dg Chest Port 1 View  Result Date: 04/07/2017 CLINICAL DATA:  Vomiting with concern for aspiration pneumonitis EXAM: PORTABLE CHEST 1 VIEW COMPARISON:  April 04, 2017 FINDINGS: There is persistent interstitial pulmonary edema. There is consolidation in the left lower lobe with left pleural effusion stable. There is less airspace opacity in the right base compared to recent study. Small right pleural effusion is present. Heart is mildly enlarged with pulmonary venous hypertension. There is aortic atherosclerosis. No new opacity evident. No adenopathy. There is advanced arthropathy in the right shoulder, stable. IMPRESSION: Slightly less opacity in the right base, likely slightly less pulmonary edema. Changes of congestive heart failure overall persist. Question superimposed pneumonia left lower lobe given the degree of consolidation. Stable left pleural effusion. There is aortic  atherosclerosis. No evident. No new opacity evident. Advanced arthropathy right shoulder. Aortic Atherosclerosis (ICD10-I70.0). Electronically Signed   By: Lowella Grip III M.D.   On: 04/07/2017 08:25   Dg Chest [facility] [address]  Result Date: 04/04/2017 CLINICAL DATA:  Acute on chronic diastolic heart failure. EXAM: PORTABLE CHEST 1 VIEW COMPARISON:  Radiograph 04/02/2017, 03/30/2017 FINDINGS: Stable heart size and mediastinal contours. Stable hazy opacity at both lung bases likely comminution of pleural fluid and atelectasis. Pulmonary edema with slight improvement from prior exam. Biapical pleuroparenchymal scarring again seen. No pneumothorax or new confluent airspace disease. Chronic change in the right shoulder. IMPRESSION: Slight improvement in congestive heart failure with mild decrease in pulmonary edema. Bilateral pleural effusions are unchanged. Electronically Signed   By: Jeb Levering M.D.   On: 04/04/2017 16:01   Dg Chest Port 1 View  Result Date: 04/02/2017 CLINICAL DATA:  Cough. EXAM: PORTABLE CHEST 1 VIEW COMPARISON:  03/30/2017 FINDINGS: 2 frontal radiographs. Nasogastric tube extends beyond the inferior aspect of the film. Patient rotated to the right. Midline trachea. Normal heart size. Layering bilateral pleural effusions are new or significantly increased. Greater on the right. No pneumothorax. progression of moderate interstitial edema. Development of bibasilar airspace disease. IMPRESSION: Worsened aeration, with new or increased moderate congestive heart failure. Development of bilateral pleural effusions and adjacent airspace disease, most likely atelectasis. Infection or aspiration cannot be excluded. Electronically Signed   By: Abigail Miyamoto M.D.   On: 04/02/2017 11:14   Dg Chest Portable 1 View  Result Date: 03/30/2017 CLINICAL DATA:  Nasogastric tube placement. Generalized abdominal pain and bloating, acute onset. Initial encounter. EXAM: PORTABLE CHEST 1 VIEW COMPARISON:   Chest radiograph performed 06/10/2016 FINDINGS: The patient's enteric tube is seen extending below the diaphragm. The lungs are hyperexpanded, with biapical scarring. Vascular congestion is noted, with increased interstitial markings. Mild interstitial edema cannot be excluded. No pleural effusion or pneumothorax is seen. The cardiomediastinal silhouette is borderline normal in size. No acute osseous abnormalities are seen. Mild degenerative change is noted at the glenohumeral joints bilaterally. IMPRESSION: 1. Enteric tube noted extending below the diaphragm. 2. Lungs hyperexpanded, with biapical scarring, likely reflecting COPD. Underlying vascular congestion, with increased interstitial markings. Mild interstitial edema cannot be excluded. Electronically Signed   By: Garald Balding M.D.   On: 03/30/2017 23:55   Dg Abd 2 Views  Result Date: 04/05/2017 CLINICAL DATA:  Vomiting with nausea EXAM: ABDOMEN - 2 VIEW COMPARISON:  04/03/2017 FINDINGS: Small bilateral effusions with dense left lower lobe consolidation and patchy infiltrate at the right base. No definite free air beneath the diaphragm. Some contrast present in the colon. Contrast opacifies dilated small bowel in the lower abdomen and pelvis, measuring up to 4 cm. IMPRESSION: Dilated loops of small bowel containing contrast within the lower abdomen and pelvis consistent with bowel obstruction. There is a small amount of contrast present in the colon. Small bilateral effusions and left greater than right bibasilar infiltrates Electronically Signed   By: Donavan Foil M.D.   On: 04/05/2017 21:07   Dg Abd 2 Views  Result Date: 04/03/2017 CLINICAL DATA:  Patient with history of small bowel obstruction. EXAM: ABDOMEN - 2 VIEW COMPARISON:  Abdominal radiograph 04/01/2017 FINDINGS: Enteric tube tip and side-port project over the stomach. Oral contrast material within the colon. Stable gaseous distended loops of small bowel within the central abdomen.  Heterogeneous opacities lung bases bilaterally. Small bilateral pleural effusions. IMPRESSION: Enteric tube tip and side-port project over the stomach. Unchanged gaseous distended loops of bowel within the central abdomen. Layering effusions and underlying opacities. Electronically Signed   By: Lovey Newcomer M.D.   On: 04/03/2017 09:13   Dg Abd 2 Views  Result Date:  04/01/2017 CLINICAL DATA:  Followup small bowel obstruction. EXAM: ABDOMEN - 2 VIEW COMPARISON:  03/31/2017 FINDINGS: There are persistent loops of dilated central small bowel with air-fluid levels consistent with a partial small bowel obstruction, similar to the prior study. There is no free air. Nasogastric tube extends below the diaphragm into the mid stomach. IMPRESSION: 1. Persistent partial small bowel obstruction. No significant change from the prior study. No free air. Electronically Signed   By: Lajean Manes M.D.   On: 04/01/2017 08:42   Dg Abd 2 Views  Result Date: 03/31/2017 CLINICAL DATA:  Follow-up small bowel obstruction. EXAM: ABDOMEN - 2 VIEW COMPARISON:  03/30/2017 abdominal CT FINDINGS: An NG tube is identified with tip overlying the proximal-mid stomach. Dilated small bowel loops are again noted and not significantly changed. There is no evidence of pneumoperitoneum. No other changes identified. IMPRESSION: Little significant change of dilated small bowel loops compatible with small bowel obstruction. No evidence of pneumoperitoneum. NG tube with tip overlying the proximal-mid stomach. Electronically Signed   By: Margarette Canada M.D.   On: 03/31/2017 21:12   Dg Swallowing Func-speech Pathology  Result Date: 04/05/2017 Objective Swallowing Evaluation: Type of Study: MBS-Modified Barium Swallow Study Patient Details Name: ARISTEA POSADA MRN: [id number] Date of Birth: 14-Nov-1928 Today's Date: 04/05/2017 Time: SLP Start Time (ACUTE ONLY): 0805-SLP Stop Time (ACUTE ONLY): 0819 SLP Time Calculation (min) (ACUTE ONLY): 14 min Past Medical  History: Past Medical History: Diagnosis Date . Aortic regurgitation  . Bronchiectasis ([place])  . COPD (chronic obstructive pulmonary disease) ([place])  . DJD (degenerative joint disease)  . HLD (hyperlipidemia)  . HTN (hypertension)  . Mild mitral regurgitation by prior echocardiogram  . Mild tricuspid regurgitation  . Palpitations   a. has known history of PAC's and PVC's. b. 09/2015: monitor showed sinus rhythm with occasional PAC's and a brief episode of PAT. [name] Thyroid disease  Past Surgical History: Past Surgical History: Procedure Laterality Date . ABDOMINAL HYSTERECTOMY  1993 . CATARACT EXTRACTION, BILATERAL   HPI: 83 year old female with a history of bronchiectasis/COPD, chronic respiratory failure on 2 L at night, hypertension, hyperlipidemia, diastolic CHF, hypothyroidism, and PATpresented with a one to two-day history of abdominal pain with associated nausea and vomiting. The patient denied any fevers, chills, chest pain, shortness breath, diarrhea, hematochezia, melena. CT of the abdomen and pelvis at the time of admission showed dilated small bowel loops up to 4.6 cm in the mid pelvis with a transition zone in the right pelvis. NG tube was inserted for decompression and Gen. surgery was consulted to assist with management. Overall, her bowel function has improved, and her NG tube was discontinued 7/8. Her diet was advanced which she tolerated. Her hospitalization has been Located by atrial tachycardia/atrial fibrillation, acute on chronic diastolic CHF, and aspiration pneumonia. Pt and family report continued swallowing difficulty. ST will evaluate and treat as indicated. No Data Recorded Assessment / Plan / Recommendation CHL IP CLINICAL IMPRESSIONS 04/05/2017 Clinical Impression Pt presents with an inconsistent mild to moderate oropharyngeal dysphagia. Oral phase characterized by decreased bolus cohesion of solids and delayed oral transit. Swallow initiation delayed throughout PO trials, to the level of  the valleculae with puree and solids, and to the level of the pyriform sinuses with thin and nectar thick liquids. Prominent vallecular residuals evidenced secondary to reduced tongue base retraction, which were increased with solids and thickened consistencies. Thin liquid alternation aided in clearing vallecular residuals.  Inconsistent penetration occured with thin liquids before the swallow that intermittently  reached the level of the vocal cords and was cleared out of laryngeal vestibule intermittently. Volitional throat clear aided in expelling penetrates with cueing from SLP. Nectar thick liquids without penetration. As PO trials progressed pt did evidence cup and straw sips of thin liquids without penetration. Barium tablet administered with thin liquids. Pt displayed difficulty propelling tablet throughout pharynx, initially sticking in the valleculae, then the pyriform sinuses, ultimately requiring nectar thick liquids to clear through pharynx. As pt was struggling to pass pill throughout pharynx, silent aspiration of thin liquids occured after the swallow. Recommend chopped consistencies with thin liquids in isolation. Pt advised to avoid mixed consistencies as airway protection appears compromised when two consistencies administered at the same time. Recommend medicines whole in puree (crush larger meds as needed). ST to follow up for diet tolerance and education with family for safe swallow strategies  SLP Visit Diagnosis Dysphagia, oropharyngeal phase (R13.12) Attention and concentration deficit following -- Frontal lobe and executive function deficit following -- Impact on safety and function Moderate aspiration risk   CHL IP TREATMENT RECOMMENDATION 04/05/2017 Treatment Recommendations Therapy as outlined in treatment plan below   Prognosis 04/05/2017 Prognosis for Safe Diet Advancement Good Barriers to Reach Goals -- Barriers/Prognosis Comment -- CHL IP DIET RECOMMENDATION 04/05/2017 SLP Diet  Recommendations Dysphagia 2 (Fine chop) solids;Thin liquid;No mixed consistencies Liquid Administration via -- Medication Administration Whole meds with puree Compensations Minimize environmental distractions;Slow rate;Small sips/bites;Multiple dry swallows after each bite/sip;Follow solids with liquid;Clear throat intermittently Postural Changes Remain semi-upright after after feeds/meals (Comment);Seated upright at 90 degrees   CHL IP OTHER RECOMMENDATIONS 04/05/2017 Recommended Consults -- Oral Care Recommendations Oral care BID Other Recommendations --   CHL IP FOLLOW UP RECOMMENDATIONS 04/05/2017 Follow up Recommendations 24 hour supervision/assistance   CHL IP FREQUENCY AND DURATION 04/05/2017 Speech Therapy Frequency (ACUTE ONLY) min 2x/week Treatment Duration 2 weeks      CHL IP ORAL PHASE 04/05/2017 Oral Phase Impaired Oral - Pudding Teaspoon -- Oral - Pudding Cup -- Oral - Honey Teaspoon -- Oral - Honey Cup -- Oral - Nectar Teaspoon Delayed oral transit;Lingual/palatal residue Oral - Nectar Cup Delayed oral transit;Lingual/palatal residue;Weak lingual manipulation Oral - Nectar Straw Weak lingual manipulation;Delayed oral transit;Lingual/palatal residue Oral - Thin Teaspoon Weak lingual manipulation;Delayed oral transit;Lingual/palatal residue Oral - Thin Cup Weak lingual manipulation;Delayed oral transit;Lingual/palatal residue Oral - Thin Straw Delayed oral transit;Weak lingual manipulation;Lingual/palatal residue Oral - Puree Delayed oral transit;Weak lingual manipulation;Lingual pumping Oral - Mech Soft -- Oral - Regular Lingual/palatal residue;Piecemeal swallowing;Delayed oral transit;Decreased bolus cohesion;Weak lingual manipulation Oral - Multi-Consistency -- Oral - Pill Lingual pumping;Reduced posterior propulsion;Delayed oral transit;Piecemeal swallowing;Lingual/palatal residue Oral Phase - Comment --  CHL IP PHARYNGEAL PHASE 04/05/2017 Pharyngeal Phase Impaired Pharyngeal- Pudding Teaspoon --  Pharyngeal -- Pharyngeal- Pudding Cup -- Pharyngeal -- Pharyngeal- Honey Teaspoon -- Pharyngeal -- Pharyngeal- Honey Cup -- Pharyngeal -- Pharyngeal- Nectar Teaspoon Reduced tongue base retraction;Delayed swallow initiation-vallecula;Delayed swallow initiation-pyriform sinuses;Pharyngeal residue - valleculae Pharyngeal -- Pharyngeal- Nectar Cup Delayed swallow initiation-vallecula;Delayed swallow initiation-pyriform sinuses;Reduced tongue base retraction;Pharyngeal residue - valleculae Pharyngeal -- Pharyngeal- Nectar Straw -- Pharyngeal -- Pharyngeal- Thin Teaspoon Delayed swallow initiation-vallecula;Penetration/Aspiration during swallow;Penetration/Aspiration before swallow;Pharyngeal residue - valleculae;Reduced tongue base retraction;Reduced epiglottic inversion Pharyngeal Material enters airway, CONTACTS cords and then ejected out Pharyngeal- Thin Cup Delayed swallow initiation-vallecula;Reduced tongue base retraction;Pharyngeal residue - valleculae;Penetration/Aspiration during swallow;Penetration/Aspiration before swallow Pharyngeal Material enters airway, CONTACTS cords and not ejected out Pharyngeal- Thin Straw Penetration/Aspiration during swallow;Penetration/Aspiration before swallow;Delayed swallow initiation-vallecula;Reduced tongue base retraction;Pharyngeal residue - valleculae  Pharyngeal Material enters airway, remains ABOVE vocal cords and not ejected out Pharyngeal- Puree Delayed swallow initiation-vallecula;Pharyngeal residue - valleculae;Reduced tongue base retraction Pharyngeal -- Pharyngeal- Mechanical Soft -- Pharyngeal -- Pharyngeal- Regular Delayed swallow initiation-vallecula;Pharyngeal residue - valleculae;Reduced tongue base retraction Pharyngeal -- Pharyngeal- Multi-consistency -- Pharyngeal -- Pharyngeal- Pill Delayed swallow initiation-vallecula;Other (Comment);Penetration/Apiration after swallow Pharyngeal Material enters airway, passes BELOW cords and not ejected out despite cough  attempt by patient Pharyngeal Comment --  No flowsheet data found. Arvil Chaco MA, CCC-SLP Acute Care Speech Language Pathologist  Levi Aland 04/05/2017, 9:48 AM              Medications:   . bisacodyl  10 mg Rectal q morning - 10a  . diltiazem  90 mg Oral Q6H  . enoxaparin (LOVENOX) injection  30 mg Subcutaneous Q24H  . feeding supplement (ENSURE ENLIVE)  237 mL Oral BID BM  . furosemide  20 mg Oral Daily  . ipratropium-albuterol  3 mL Nebulization Q8H  . levothyroxine  25 mcg Oral QAC breakfast  . mouth rinse  15 mL Mouth Rinse BID  . metoprolol tartrate  25 mg Oral BID  . mirtazapine  7.5 mg Oral QHS  . pantoprazole (PROTONIX) IV  40 mg Intravenous Q24H  . potassium chloride  40 mEq Oral BID  . senna-docusate  1 tablet Oral QHS  . simethicone  80 mg Oral BID   Continuous Infusions:  Medical decision making is moderately complex therefore this is a level 2 visit.  LOS: 11 days   [facility] Pager [phone]. If unable to reach me by pager, please call my cell phone at [phone].  *Please refer to [email or web address], password [email or web address] to get updated schedule on who will round on this patient, as hospitalists switch teams weekly. If 7PM-7AM, please contact night-coverage at [email or web address], password [email or web address] for any overnight needs.  04/11/2017, 9:53 AM

## 2017-04-11 NOTE — Telephone Encounter (Signed)
Echo with normal LV function and not significantly changed compared to previous [employer]

## 2017-04-11 NOTE — Telephone Encounter (Signed)
Please call,daughter needs to talk tou you about what is going on with the patient.

## 2017-04-11 NOTE — Consult Note (Signed)
Cardiology Consultation:   Patient ID: BRINDY HIGGINBOTHAM; [id number]; 10-31-1928   Admit date: 03/30/2017 Date of Consult: 04/11/2017  Primary Care Provider: Unk Pinto, MD Primary Cardiologist: Stanford Breed    Patient Profile:   Andrea [name] is a 83 y.o. female with a hx of PAC;s PVC's and PAT, Hypertension, hypothyroidism, and hyperlipidemia who is being seen today for the evaluation of atrial fib/fluutter, at the request of Dr. Rockne Menghini, Hospitalist Service.   History of Present Illness:   Andrea [name] presented to the hospital with complaints of abdominal pain on 43/32/9518 colicky in nature, was found to have a small bowel obstruction with small bowel loops dilated with a definite transition zone noted in the right pelvis. Has been seen by surgery, Dr. Arnoldo Morale on 04/03/2017 with follow-up KUB revealing resolving partial small bowel obstruction. The patient was transferred to ICU in the setting of SVT. The patient did have episodes of atrial fib with RVR heart rate of 50 and 150s to 160 bpm, pulse treated with IV Lopressor and IV diltiazem and spontaneously converted to normal sinus rhythm on 04/04/2017.  Unfortunate, the patient went back into atrial tachycardia the following day and diltiazem drip was restarted, with transition to by mouth diltiazem. I partially, the patient was unable to hold down fluids or medications due to frequent episodes of emesis. Patient also had a choking sensation. She was found to be high risk for surgical intervention concerning small bowel obstruction by surgery.  Small bowel obstruction has almost completely resolved, but she continues to have intermittent heart rate elevations, SVT, atrial tachycardia and atrial flutter. She is now able to take in by mouth diltiazem. Diltiazem has been increased to 90 mg every 6 hours, and she is started on low-dose metoprolol 25 mg twice a day. She is not currently on anticoagulation therapy wit the exception of VTE prophylaxis,  . CHADS VASC Score of 4. The patient adamantly refuses adequate ablation therapy as she has had issues with bleeding, severe bruising, epistaxis, and subcutaneous bleeding when she had been on anticoagulations therapy in the past.  She is recently been diagnosed with aspiration pneumonia. She continues on antibiotic therapy.    Past Medical History:  Diagnosis Date  . Aortic regurgitation   . Bronchiectasis ([place])   . COPD (chronic obstructive pulmonary disease) ([place])   . DJD (degenerative joint disease)   . HLD (hyperlipidemia)   . HTN (hypertension)   . Mild mitral regurgitation by prior echocardiogram   . Mild tricuspid regurgitation   . Palpitations    a. has known history of PAC's and PVC's. b. 09/2015: monitor showed sinus rhythm with occasional PAC's and a brief episode of PAT.  [name] Thyroid disease     Past Surgical History:  Procedure Laterality Date  . ABDOMINAL HYSTERECTOMY  1993  . CATARACT EXTRACTION, BILATERAL       Inpatient Medications: Scheduled Meds: . bisacodyl  10 mg Rectal q morning - 10a  . diltiazem  90 mg Oral Q6H  . enoxaparin (LOVENOX) injection  30 mg Subcutaneous Q24H  . feeding supplement (ENSURE ENLIVE)  237 mL Oral BID BM  . furosemide  20 mg Oral Daily  . ipratropium-albuterol  3 mL Nebulization Q8H  . levothyroxine  25 mcg Oral QAC breakfast  . mouth rinse  15 mL Mouth Rinse BID  . metoprolol tartrate  25 mg Oral BID  . mirtazapine  7.5 mg Oral QHS  . pantoprazole (PROTONIX) IV  40 mg Intravenous Q24H  .  potassium chloride  40 mEq Oral BID  . senna-docusate  1 tablet Oral QHS  . simethicone  80 mg Oral BID   Continuous Infusions:  PRN Meds: ALPRAZolam, hydrALAZINE, ipratropium-albuterol, morphine injection, ondansetron **OR** ondansetron (ZOFRAN) IV  Allergies:    Allergies  Allergen Reactions  . Vancomycin     Worsening kidney function  . Sulfa Antibiotics Palpitations    Unknown    Social History:   Social History   Social  History  . Marital status: Married    Spouse name: N/A  . Number of children: 5  . Years of education: N/A   Occupational History  . retired    Social History Main Topics  . Smoking status: Former Smoker    Packs/day: 1.00    Years: 20.00    Types: Cigarettes    Quit date: 09/27/1974  . Smokeless tobacco: Never Used  . Alcohol use No  . Drug use: No  . Sexual activity: No   Other Topics Concern  . Not on file   Social History Narrative  . No narrative on file    Family History:   The patient's family history includes Asthma in her brother; Cancer in her father; Diabetes in her brother, mother, and sister; Emphysema in her mother, sister, and sister; Heart attack in her sister; Rheumatic fever in her sister.  ROS:  Please see the history of present illness.  ROS  All other ROS reviewed and negative.     Physical Exam/Data:   Vitals:   04/10/17 2258 04/10/17 2350 04/11/17 0614 04/11/17 0743  BP: 133/69  130/69   Pulse: (!) 104  (!) 102   Resp: 20  18   Temp: 98.3 F (36.8 C)  98.4 F (36.9 C)   TempSrc: Oral  Oral   SpO2: 97% 96% 98% 97%  Weight:   96 lb 6.4 oz (43.7 kg)   Height:       No intake or output data in the 24 hours ending 04/11/17 1158 Filed Weights   04/09/17 0202 04/10/17 0551 04/11/17 0614  Weight: 98 lb 8.7 oz (44.7 kg) 97 lb 8 oz (44.2 kg) 96 lb 6.4 oz (43.7 kg)   Body mass index is 17.08 kg/m.  General:  Well nourished, well developed, in no acute distress. Thin, frail, emaciated HEENT: normal Lymph: no adenopathy Neck: no JVD Endocrine:  No thryomegaly Vascular: No carotid bruits; FA pulses 2+ bilaterally without bruits  Cardiac:  normal S1, S2; IRRR; tachycardic Lungs:  Diminished in the bases, with occasional coughing no wheezes some rhonchi noted in the lower left base Abd: soft, nontender, no hepatomegaly  Ext: no edema Musculoskeletal:  No deformities, BUE and BLE strength normal and equal Skin: warm and dry  Neuro:  CNs 2-12  intact, no focal abnormalities noted Psych:  Normal affect   EKG:  The EKG was personally reviewed and demonstrates:  Atrial fibrillation with RVR heart rate of 152 bpm Telemetry:  Telemetry was personally reviewed and demonstrates: Atrial fibrillation heart rates in the low 100s.  Relevant CV Studies: Echocardiogram 04/03/2017 Left ventricle: The cavity size was normal. Systolic function was   normal. The estimated ejection fraction was in the range of 60%   to 65%. Wall motion was normal; there were no regional wall   motion abnormalities. - Aortic valve: Mildly calcified annulus. Trileaflet; normal   thickness leaflets. There was mild regurgitation. - Mitral valve: Mildly calcified annulus. There was mild   regurgitation. - Tricuspid valve: There  was moderate regurgitation. - Pulmonary arteries: Systolic pressure was mildly to moderately   increased. PA peak pressure: 42 mm Hg (S).  Laboratory Data:  Chemistry Recent Labs Lab 04/07/17 0412 04/09/17 0630 04/10/17 0144  NA 145 146* 146*  Andrea 3.4* 3.4* 4.2  CL 96* 98* 99*  CO2 41* 42* 37*  GLUCOSE 115* 101* 112*  BUN 34* 24* 21*  CREATININE 1.04* 0.87 0.92  CALCIUM 8.7* 8.5* 8.7*  GFRNONAA 47* 59* 55*  GFRAA 55* >60 >60  ANIONGAP _0 No results for input(s): PROT, ALBUMIN, AST, ALT, ALKPHOS, BILITOT in the last 168 hours. Hematology Recent Labs Lab 04/05/17 0359 04/06/17 0448  WBC 10.4 8.5  RBC 4.09 4.10  HGB 12.3 12.4  HCT 39.7 40.7  MCV 97.1 99.3  MCH 30.1 30.2  MCHC 31.0 30.5  RDW 13.0 13.1  PLT 190 218   Cardiac Enzymes Recent Labs Lab 04/09/17 1911 04/10/17 0144 04/10/17 0642  TROPONINI <0.03 <0.03 <0.03   No results for input(s): TROPIPOC in the last 168 hours.  BNP Recent Labs Lab 04/05/17 0359  BNP 433.0*    DDimer No results for input(s): DDIMER in the last 168 hours.  Radiology/Studies:  Ct Abdomen Pelvis W Contrast  Result Date: 04/08/2017 CLINICAL DATA:  Abdominal pain with  nausea and vomiting. Small bowel obstruction. EXAM: CT ABDOMEN AND PELVIS WITH CONTRAST TECHNIQUE: Multidetector CT imaging of the abdomen and pelvis was performed using the standard protocol following bolus administration of intravenous contrast. CONTRAST:  45m ISOVUE-300 IOPAMIDOL (ISOVUE-300) INJECTION 61% COMPARISON:  03/30/2017 FINDINGS: Lower Chest: New small pleural effusions, left side greater than right. New left lower lobe atelectasis versus infiltrate. Mild compressive atelectasis in right lung base. Hepatobiliary: 1 cm homogeneous hypervascular lesion in the anterior left hepatic lobe shows no significant change since recent study. This most likely represents a tiny flash-filling hemangioma. Tiny sub-cm cyst again seen in the posterior left hepatic lobe. Gallbladder is unremarkable. Pancreas:  No mass or inflammatory changes. Spleen: Within normal limits in size and appearance. Adrenals/Urinary Tract: No masses identified. Tiny bilateral renal cysts again noted. No evidence of hydronephrosis. Stomach/Bowel: There has been near complete resolution of dilated bowel loops since prior study. Diverticulosis again seen involving the descending and sigmoid colon, however there is no evidence of diverticulitis. Vascular/Lymphatic: No pathologically enlarged lymph nodes. No abdominal aortic aneurysm. Aortic atherosclerosis. Reproductive: Prior hysterectomy noted. Adnexal regions are unremarkable in appearance. Other:  None. Musculoskeletal: No suspicious bone lesions identified. Advanced lumbar spondylosis and moderate dextroscoliosis again noted. IMPRESSION: Near complete resolution of dilated small bowel loops since previous study. Colonic diverticulosis, without radiographic evidence of diverticulitis. New small bilateral pleural effusions, left side greater than right, and left lower lobe atelectasis versus infiltrate. Aortic atherosclerosis. Electronically Signed   By: JEarle GellM.D.   On: 04/08/2017  09:40    Assessment and Plan:  1.PAF: Heart rate is not well-controlled currently in medication regimen. She has had A. fib with RVR brought hospitalization with one episode of normal sinus rhythm when she spontaneously converted on diltiazem drip. She is currently on diltiazem 90 mg every 6 hours, and metoprolol 25 mg daily. The patient adamantly refuses anticoagulation therapy.  Review of echocardiogram reveals normal LV systolic function with normal left atrial size. Consideration for cardioversion can be discussed however the patient will need to be on anticoagulation therapy prior to this. She has slowly refuses to have this medication.  Heart rate is multifactorial in the setting  of pneumonia, small bowel obstruction, and acute illness. She has had episodes of irregular heart rate and palpitation for years according to past medical records. Review of home medications has her on bisoprolol 5 mg, and Ativan 0.5 mg. She states that she takes Ativan when she feels her heart racing or palpating and it often resolves after taking it.  Blood pressure is stable, will increase metoprolol to 37.5 mg twice a day, for better heart rate control. Watching blood pressure carefully. With age and comorbidities would not set her on digoxin, amiodarone. Will not increase diltiazem at this time.  2. Hypertension: Currently well-controlled.  3. Severe deconditioning: Likely from acute illness, small bowel obstruction, malnutrition associated. She is beginning to feel stronger, is able to walk to the bathroom with help. Appetite is better that she is full quickly. She is on nutrition supplements.   Will be seen by Dr. Harrington Challenger after lunch. Further recommendations for medication adjustment will be made at that time. Early patient is stable.      Signed, Andrea Sims, NP  04/11/2017 11:58 AM   Pt seen and examined   I agree with findingas of Andrea [name]  I have amended note to reflect my findings Pt is an 83  yo admitted with partial SBO  She hais had recurrent atrial arrhythmias with atrial fib   She remains in atrial fib now   Rates 100s    On exam Lungs are CTA   Cardiac  Irreg irreg  No S3  Ext without edema Labs signif for TSH normal  Cr normal    CBC normal Echo normal  LVEF   LA normal     Agree with rate control  Increase in b blockade  Continiue CA blocker  I reviewed with pt and family  I would recomm anticoag given CHADSVASc of 4  Pt concerned about GI history but Iwould have close f/u clinically as well as with labs (CBC) every couple months If anticoagulate would recomm Eliquis 2.5 bid   HTN  Follow  Adequate controll  Dorris Carnes

## 2017-04-11 NOTE — Telephone Encounter (Signed)
Spoke with pt dtr, the patient is currently admitted at [facility] due to intestinal blockage. She has had an echocardiogram and they are having trouble getting her heart rate down so she can participate in rehab. They wanted to let dr Stanford Breed know and get his thoughts on the echo. Will forward for dr Stanford Breed review

## 2017-04-12 ENCOUNTER — Inpatient Hospital Stay
Admission: RE | Admit: 2017-04-12 | Discharge: 2017-04-30 | Disposition: A | Discharge status: Home / Self Care | Payer: Medicare Other | Source: Ambulatory Visit | Attending: Internal Medicine | Admitting: Internal Medicine

## 2017-04-12 DIAGNOSIS — K56609 Unspecified intestinal obstruction, unspecified as to partial versus complete obstruction: Secondary | ICD-10-CM | POA: Diagnosis not present

## 2017-04-12 DIAGNOSIS — I824Z2 Acute embolism and thrombosis of unspecified deep veins of left distal lower extremity: Principal | ICD-10-CM

## 2017-04-12 DIAGNOSIS — I5032 Chronic diastolic (congestive) heart failure: Secondary | ICD-10-CM | POA: Diagnosis not present

## 2017-04-12 DIAGNOSIS — E43 Unspecified severe protein-calorie malnutrition: Secondary | ICD-10-CM | POA: Diagnosis not present

## 2017-04-12 DIAGNOSIS — F419 Anxiety disorder, unspecified: Secondary | ICD-10-CM | POA: Diagnosis not present

## 2017-04-12 DIAGNOSIS — M6281 Muscle weakness (generalized): Secondary | ICD-10-CM | POA: Diagnosis not present

## 2017-04-12 DIAGNOSIS — I471 Supraventricular tachycardia: Secondary | ICD-10-CM | POA: Diagnosis not present

## 2017-04-12 DIAGNOSIS — R1312 Dysphagia, oropharyngeal phase: Secondary | ICD-10-CM | POA: Diagnosis not present

## 2017-04-12 DIAGNOSIS — J449 Chronic obstructive pulmonary disease, unspecified: Secondary | ICD-10-CM | POA: Diagnosis not present

## 2017-04-12 DIAGNOSIS — J438 Other emphysema: Secondary | ICD-10-CM | POA: Diagnosis not present

## 2017-04-12 DIAGNOSIS — I480 Paroxysmal atrial fibrillation: Secondary | ICD-10-CM | POA: Diagnosis not present

## 2017-04-12 DIAGNOSIS — E038 Other specified hypothyroidism: Secondary | ICD-10-CM | POA: Diagnosis not present

## 2017-04-12 DIAGNOSIS — I5033 Acute on chronic diastolic (congestive) heart failure: Secondary | ICD-10-CM | POA: Diagnosis not present

## 2017-04-12 DIAGNOSIS — R262 Difficulty in walking, not elsewhere classified: Secondary | ICD-10-CM | POA: Diagnosis not present

## 2017-04-12 DIAGNOSIS — E039 Hypothyroidism, unspecified: Secondary | ICD-10-CM | POA: Diagnosis not present

## 2017-04-12 DIAGNOSIS — R635 Abnormal weight gain: Secondary | ICD-10-CM | POA: Diagnosis not present

## 2017-04-12 DIAGNOSIS — Z9981 Dependence on supplemental oxygen: Secondary | ICD-10-CM | POA: Diagnosis not present

## 2017-04-12 DIAGNOSIS — Z87898 Personal history of other specified conditions: Secondary | ICD-10-CM

## 2017-04-12 DIAGNOSIS — I10 Essential (primary) hypertension: Secondary | ICD-10-CM | POA: Diagnosis not present

## 2017-04-12 DIAGNOSIS — R600 Localized edema: Secondary | ICD-10-CM | POA: Diagnosis not present

## 2017-04-12 DIAGNOSIS — E559 Vitamin D deficiency, unspecified: Secondary | ICD-10-CM | POA: Diagnosis not present

## 2017-04-12 DIAGNOSIS — J9611 Chronic respiratory failure with hypoxia: Secondary | ICD-10-CM | POA: Diagnosis not present

## 2017-04-12 DIAGNOSIS — J690 Pneumonitis due to inhalation of food and vomit: Secondary | ICD-10-CM | POA: Diagnosis not present

## 2017-04-12 MED ORDER — METOPROLOL TARTRATE 37.5 MG PO TABS: 37.5000 mg | ORAL_TABLET | Freq: Two times a day (BID) | ORAL | Status: DC

## 2017-04-12 MED ORDER — ENSURE ENLIVE PO LIQD: 237.0000 mL | Freq: Two times a day (BID) | ORAL | 12 refills | Status: DC

## 2017-04-12 MED ORDER — PANTOPRAZOLE SODIUM 40 MG PO TBEC: 40.0000 mg | DELAYED_RELEASE_TABLET | Freq: Every day | ORAL | Status: DC

## 2017-04-12 MED ORDER — MIRTAZAPINE 7.5 MG PO TABS: 7.5000 mg | ORAL_TABLET | Freq: Every day | ORAL | 0 refills | Status: DC

## 2017-04-12 MED ORDER — SIMETHICONE 80 MG PO CHEW: 80.0000 mg | CHEWABLE_TABLET | Freq: Two times a day (BID) | ORAL | 0 refills | Status: DC

## 2017-04-12 MED ORDER — BISACODYL 10 MG RE SUPP: 10.0000 mg | Freq: Every morning | RECTAL | 0 refills | Status: DC

## 2017-04-12 MED ORDER — DILTIAZEM HCL 90 MG PO TABS
90.0000 mg | ORAL_TABLET | Freq: Four times a day (QID) | ORAL | Status: DC
Start: 2017-04-12 — End: 2017-05-17

## 2017-04-12 MED ORDER — SENNOSIDES-DOCUSATE SODIUM 8.6-50 MG PO TABS: 1.0000 | ORAL_TABLET | Freq: Every day | ORAL | Status: DC

## 2017-04-12 MED ORDER — APIXABAN 2.5 MG PO TABS: 2.5000 mg | ORAL_TABLET | Freq: Two times a day (BID) | ORAL | Status: DC

## 2017-04-12 MED ORDER — ALPRAZOLAM 0.25 MG PO TABS: 0.2500 mg | ORAL_TABLET | Freq: Three times a day (TID) | ORAL | 0 refills | Status: DC | PRN

## 2017-04-12 NOTE — Telephone Encounter (Signed)
Left message for the dtr, aware echo looks good. Patient was seen by our group at [facility]. She is to call back if she has any other questions.

## 2017-04-12 NOTE — Discharge Instructions (Addendum)
Apixaban oral tablets What is this medicine? APIXABAN (a PIX a ban) is an anticoagulant (blood thinner). It is used to lower the chance of stroke in people with a medical condition called atrial fibrillation. It is also used to treat or prevent blood clots in the lungs or in the veins. This medicine may be used for other purposes; ask your health care provider or pharmacist if you have questions. COMMON BRAND NAME(S): Eliquis What should I tell my health care provider before I take this medicine? They need to know if you have any of these conditions: -bleeding disorders -bleeding in the brain -blood in your stools (black or tarry stools) or if you have blood in your vomit -history of stomach bleeding -kidney disease -liver disease -mechanical heart valve -an unusual or allergic reaction to apixaban, other medicines, foods, dyes, or preservatives -pregnant or trying to get pregnant -breast-feeding How should I use this medicine? Take this medicine by mouth with a glass of water. Follow the directions on the prescription label. You can take it with or without food. If it upsets your stomach, take it with food. Take your medicine at regular intervals. Do not take it more often than directed. Do not stop taking except on your doctor's advice. Stopping this medicine may increase your risk of a blot clot. Be sure to refill your prescription before you run out of medicine. Talk to your pediatrician regarding the use of this medicine in children. Special care may be needed. Overdosage: If you think you have taken too much of this medicine contact a poison control center or emergency room at once. NOTE: This medicine is only for you. Do not share this medicine with others. What if I miss a dose? If you miss a dose, take it as soon as you can. If it is almost time for your next dose, take only that dose. Do not take double or extra doses. What may interact with this medicine? This medicine may  interact with the following: -aspirin and aspirin-like medicines -certain medicines for fungal infections like ketoconazole and itraconazole -certain medicines for seizures like carbamazepine and phenytoin -certain medicines that treat or prevent blood clots like warfarin, enoxaparin, and dalteparin -clarithromycin -NSAIDs, medicines for pain and inflammation, like ibuprofen or naproxen -rifampin -ritonavir -St. John's wort This list may not describe all possible interactions. Give your health care provider a list of all the medicines, herbs, non-prescription drugs, or dietary supplements you use. Also tell them if you smoke, drink alcohol, or use illegal drugs. Some items may interact with your medicine. What should I watch for while using this medicine? Visit your doctor or health care professional for regular checks on your progress. Notify your doctor or health care professional and seek emergency treatment if you develop breathing problems; changes in vision; chest pain; severe, sudden headache; pain, swelling, warmth in the leg; trouble speaking; sudden numbness or weakness of the face, arm or leg. These can be signs that your condition has gotten worse. If you are going to have surgery or other procedure, tell your doctor that you are taking this medicine. What side effects may I notice from receiving this medicine? Side effects that you should report to your doctor or health care professional as soon as possible: -allergic reactions like skin rash, itching or hives, swelling of the face, lips, or tongue -signs and symptoms of bleeding such as bloody or black, tarry stools; red or dark-brown urine; spitting up blood or brown material that looks like coffee  grounds; red spots on the skin; unusual bruising or bleeding from the eye, gums, or nose This list may not describe all possible side effects. Call your doctor for medical advice about side effects. You may report side effects to FDA at  1-800-FDA-1088. Where should I keep my medicine? Keep out of the reach of children. Store at room temperature between 20 and 25 degrees C (68 and 77 degrees F). Throw away any unused medicine after the expiration date. NOTE: This sheet is a summary. It may not cover all possible information. If you have questions about this medicine, talk to your doctor, pharmacist, or health care provider.  2018 Elsevier/Gold Standard (2016-04-05 11:54:23)

## 2017-04-12 NOTE — Care Management Important Message (Signed)
Important Message  Patient Details  Name: THURMA PRIEGO MRN: [id number] Date of Birth: [birth year]   Medicare Important Message Given:  Yes    Tylor Gambrill, Chauncey [place], RN 04/12/2017, 1:11 PM

## 2017-04-12 NOTE — Care Management Note (Signed)
Case Management Note  Patient Details  Name: Andrea [name] MRN: [id number] Date of Birth: [birth year]   Status of Service:  In process, will continue to follow  If discussed at Long Length of Stay Meetings, dates discussed:   04/12/2017 Additional Comments:  Aniella Wandrey, Chauncey [place], RN 04/12/2017, 10:32 AM

## 2017-04-12 NOTE — Progress Notes (Signed)
Progress Note  Patient Name: Andrea [name] Date of Encounter: 04/12/2017  Primary Cardiologist: Stanford Breed  Subjective   No complaints of chest pain, rapid  HR, or abdominal pain.   Inpatient Medications    Scheduled Meds: . bisacodyl  10 mg Rectal q morning - 10a  . diltiazem  90 mg Oral Q6H  . enoxaparin (LOVENOX) injection  30 mg Subcutaneous Q24H  . feeding supplement (ENSURE ENLIVE)  237 mL Oral BID BM  . ipratropium-albuterol  3 mL Nebulization Q8H  . levothyroxine  25 mcg Oral QAC breakfast  . mouth rinse  15 mL Mouth Rinse BID  . metoprolol tartrate  37.5 mg Oral BID  . mirtazapine  7.5 mg Oral QHS  . pantoprazole (PROTONIX) IV  40 mg Intravenous Q24H  . potassium chloride  40 mEq Oral BID  . senna-docusate  1 tablet Oral QHS  . simethicone  80 mg Oral BID   Continuous Infusions:  PRN Meds: ALPRAZolam, hydrALAZINE, ipratropium-albuterol, morphine injection, ondansetron **OR** ondansetron (ZOFRAN) IV   Vital Signs    Vitals:   04/11/17 1558 04/11/17 2102 04/11/17 2305 04/12/17 0509  BP:  123/68  (!) 122/51  Pulse:  (!) 102  81  Resp:  16  16  Temp:  98.1 F (36.7 C)  97.9 F (36.6 C)  TempSrc:  Oral  Axillary  SpO2: 97% 94% 96% 95%  Weight:    95 lb 1.6 oz (43.1 kg)  Height:        Intake/Output Summary (Last 24 hours) at 04/12/17 1025 Last data filed at 04/12/17 0600  Gross per 24 hour  Intake              180 ml  Output                0 ml  Net              180 ml   Filed Weights   04/10/17 0551 04/11/17 0614 04/12/17 0509  Weight: 97 lb 8 oz (44.2 kg) 96 lb 6.4 oz (43.7 kg) 95 lb 1.6 oz (43.1 kg)    Telemetry    SR with short bursts of sinus tachycardia vs PAF.   Physical Exam   GEN: No acute distress.   Neck: No JVD Cardiac: RRR, 1/6 systolic murmurs, rubs, or gallops.  Respiratory: Diminished in the bases,R>L.   GI: Soft, nontender, non-distended  MS: No edema; No deformity. Neuro:  Nonfocal  Psych: Normal affect   Labs      Chemistry Recent Labs Lab 04/07/17 0412 04/09/17 0630 04/10/17 0144  NA 145 146* 146*  K 3.4* 3.4* 4.2  CL 96* 98* 99*  CO2 41* 42* 37*  GLUCOSE 115* 101* 112*  BUN 34* 24* 21*  CREATININE 1.04* 0.87 0.92  CALCIUM 8.7* 8.5* 8.7*  GFRNONAA 47* 59* 55*  GFRAA 55* >60 >60  ANIONGAP _0 Hematology Recent Labs Lab 04/06/17 0448  WBC 8.5  RBC 4.10  HGB 12.4  HCT 40.7  MCV 99.3  MCH 30.2  MCHC 30.5  RDW 13.1  PLT 218    Cardiac Enzymes Recent Labs Lab 04/09/17 1911 04/10/17 0144 04/10/17 0642  TROPONINI <0.03 <0.03 <0.03   No results for input(s): TROPIPOC in the last 168 hours.   BNPNo results for input(s): BNP, PROBNP in the last 168 hours.   DDimer No results for input(s): DDIMER in the last 168 hours.   Radiology  No results found.  Cardiac Studies   Echocardiogram 04/03/2017 Left ventricle: The cavity size was normal. Systolic function was normal. The estimated ejection fraction was in the range of 60% to 65%. Wall motion was normal; there were no regional wall motion abnormalities. - Aortic valve: Mildly calcified annulus. Trileaflet; normal thickness leaflets. There was mild regurgitation. - Mitral valve: Mildly calcified annulus. There was mild regurgitation. - Tricuspid valve: There was moderate regurgitation. - Pulmonary arteries: Systolic pressure was mildly to moderately increased. PA peak pressure: 42 mm Hg (S).    Patient Profile     83 y.o. female admitted with SBO obstruction, and PAF with RVR hx of PAC;s PVC's and PAT, Hypertension, hypothyroidism, and hyperlipidemia who is being seen today for the evaluation of atrial fib/fluutter.   Assessment & Plan    1. PAF:  Now in SR, with increased dose of metoprolol to 37.5 mg BID. She remains on diltiazem 90 mg Q 6 hours. Can consider transitioning to long acting once she is closer to discharge. Likely will need inpatient vs OP PT due to significant deconditioning.  Patient adamantly refuses anticoagulation therapy, with history of epistaxis, and excessive bruising  CHADS VASC Score of 4.   2. SBO: Resolved. Eating well, with increasing appetite.   3. Hypertension: Currently controlled without evidence of hypotension with increased dose of metoprolol.     Signed, Phill Myron. West Pugh, ANP, AACC   04/12/2017, 10:25 AM    The patient was seen and examined, and I agree with the history, physical exam, assessment and plan as documented above, with modifications as noted below. I have also personally reviewed all relevant documentation, old records, labs, and both radiographic and cardiovascular studies. I have also independently interpreted old and new ECG's.  83 yr old woman with PAF in context of SBO most recently. Currently maintaining sinus rhythm on diltiazem 90 mg q 6 hrs and metoprolol 37.5 mg bid. K. West Pugh and Dr. Harrington Challenger both discussed anticoagulation with both the patient and her family members. The patient is deferring to her daughters to make a decision. I agree with Dr. Harrington Challenger that low dose Eliquis would be appropriate.  Can consolidate short-acting diltiazem to long-acting once nearing discharge.  No further recommendations. Will sign off.   Kate Sable, MD, [facility]  04/12/2017 11:15 AM

## 2017-04-12 NOTE — Clinical Social Work Placement (Signed)
   CLINICAL SOCIAL WORK PLACEMENT  NOTE  Date:  04/12/2017  Patient Details  Name: Andrea [name] MRN: [id number] Date of Birth: [birth year]  Clinical Social Work is seeking post-discharge placement for this patient at the [place] level of care (*CSW will initial, date and re-position this form in  chart as items are completed):  Yes   Patient/family provided with [facility] Work Department's list of facilities offering this level of care within the geographic area requested by the patient (or if unable, by the patient's family).  Yes   Patient/family informed of their freedom to choose among providers that offer the needed level of care, that participate in Medicare, Medicaid or managed care program needed by the patient, have an available bed and are willing to accept the patient.  Yes   Patient/family informed of 's ownership interest in [facility] and [facility], as well as of the fact that they are under no obligation to receive care at these facilities.  PASRR submitted to EDS on 04/07/17     PASRR number received on 04/07/17     Existing PASRR number confirmed on       FL2 transmitted to all facilities in geographic area requested by pt/family on       FL2 transmitted to all facilities within larger geographic area on       Patient informed that his/her managed care company has contracts with or will negotiate with certain facilities, including the following:        Yes   Patient/family informed of bed offers received.  Patient chooses bed at [facility]     Physician recommends and patient chooses bed at      Patient to be transferred to [facility] on 04/12/17.  Patient to be transferred to facility by [facility] hospital staff     Patient family notified on 04/12/17 of transfer.  Name of family member notified:  dtr. Margarita Grizzle at bedside     PHYSICIAN       Additional Comment: LCSW sent updated  discharge summary to facility to indicated the addition of Eliquis.  LCSW signing off.   _______________________________________________ Ihor Gully, LCSW 04/12/2017, 2:06 PM

## 2017-04-12 NOTE — Discharge Summary (Addendum)
Physician Discharge Summary  Andrea [name]:[id number] DOB: 03/09/1929 DOA: 03/30/2017  PCP: Unk Pinto, MD  Admit date: 03/30/2017 Discharge date: 04/12/2017  Admitted From: Home Discharge disposition: [facility]   Recommendations for Outpatient Follow-Up:   1. Please obtain weekly CBC checks while on Eliquis.  Discharge Diagnosis:   Principal Problem:    Small bowel obstruction (HCC) Active Problems:    Essential hypertension    COPD with emphysema (HCC)    Hypothyroidism    Hyponatremia    Chronic diastolic CHF (congestive heart failure) (HCC)    Chronic respiratory failure with hypoxia (HCC)    Atrial tachycardia (HCC)    Aspiration pneumonitis (HCC)    Acute on chronic diastolic CHF (congestive heart failure) (HCC)    AF (paroxysmal atrial fibrillation) (HCC)    Protein-calorie malnutrition, severe  Discharge Condition: Improved.  Diet recommendation: Low sodium, heart healthy.    History of Present Illness:   Andrea [name] is an 83 y.o. female with a history of bronchiectasis/COPD, chronic respiratory failure on 2 L at night, hypertension, hyperlipidemia, diastolic CHF, hypothyroidism, and PATpresented with a one to two-day history of abdominal pain with associated nausea and vomiting. The patient denied any fevers, chills, chest pain, shortness breath, diarrhea, hematochezia, melena. CT of the abdomen and pelvis at the time of admission showed dilated small bowel loops up to 4.6 cm in the mid pelvis with a transition zone in the right pelvis. NG tube was inserted for decompression and Gen. surgery was consulted to assist with management. Overall, her bowel function has improved, and her NG tube was discontinued. Her diet was advanced which she tolerated. Her hospitalization has been complicated by atrial tachycardia/SVT, acute on chronic diastolic CHF, and aspiration pneumonia.   Hospital Course by Problem:   Principal problem:    Small bowel obstruction Essentially resolved. CT of the abdomen done 04/08/17 showed near complete resolution of dilated small bowel loops from prior study. Continue dysphagia 2 diet.  Active problems:  SVT/ atrial flutter/atrial fibrillation CHADSVASc=4. Blood thinners recommended by cardiologist however the patient has had reservations. Troponins negative 3. TSH WNL. Normal EF on 2-D echocardiogram. Continue Cardizem and metoprolol. Heart sounds continue to be irregular and tachycardic at times. Discussion held with daughter and patient at the bedside who consent to using Eliquis.  Risks/benefits discussed.  Aspiration pneumonia Left greater than right infiltrate noted on imaging studies. Likely aspiration given recent SBO with vomiting. She has completed a seven-day course of Unasyn.  Protein energy malnutrition/Anorexia  Continue nutritional supplements and mirtazapine for appetite stimulation. Body mass index is 17.08 kg/m.  Acute on Chronic diastolic CHF Likely triggered by rapid A. fib. Normal EF on most recent echo. Lungs currently clear. Continue supplemental oxygen. Appears to be compensated at present. May need to be diuresed on a when necessary basis.  COPD/bronchiectasis/chronic respiratory failure with hypoxia- stable Patient remains on supplemental oxygen and respiratory status is currently stable.  Hypokalemia/Hypomagnesemia Electrolytes stable.  Essential hypertension Continue Cardizem and metoprolol. Blood pressure controlled.  Anxiety Continue home dose of Xanax.  Hypothyroidism Continue home dose of Synthroid.  Medical Consultants:    Cardiology: Dr. Dorris Carnes   Discharge Exam:   Vitals:   04/12/17 1059 04/12/17 1319  BP: (!) 110/51 (!) 123/53  Pulse: 68 65  Resp:  18  Temp:  (!) 97.4 F (36.3 C)   Vitals:   04/12/17 0509 04/12/17 1045 04/12/17 1059 04/12/17 1319  BP: (!) 122/51  (!) 110/51 [name])  123/53  Pulse: 81  68 65  Resp: 16    18  Temp: 97.9 F (36.6 C)   (!) 97.4 F (36.3 C)  TempSrc: Axillary   Oral  SpO2: 95% (!) 87%  94%  Weight: 43.1 kg (95 lb 1.6 oz)     Height:        General exam: Appears calm and comfortable. Cachectic appearing with temporal muscle wasting and sunken eyes. Respiratory system: Clear to auscultation but diminished throughout. No respiratory distress. Cardiovascular system: S1 & S2 heard, heart sounds were regular today. No JVD,  rubs, gallops or clicks. No murmurs. Gastrointestinal system: Abdomen is nondistended, soft and nontender. No organomegaly or masses felt. Normal bowel sounds heard. Central nervous system: Alert. No focal neurological deficits. Extremities: No clubbing,  or cyanosis. No edema. Skin: No rashes, lesions or ulcers. Scattered ecchymosis. Psychiatry: Judgement and insight appear impaired. Mood & affect appropriate.    The results of significant diagnostics from this hospitalization (including imaging, microbiology, ancillary and laboratory) are listed below for reference.     Procedures and Diagnostic Studies:   Ct Abdomen Pelvis W Contrast  Result Date: 03/30/2017 CLINICAL DATA:  Abdominal pain with bloating.  Nausea and vomiting. EXAM: CT ABDOMEN AND PELVIS WITH CONTRAST TECHNIQUE: Multidetector CT imaging of the abdomen and pelvis was performed using the standard protocol following bolus administration of intravenous contrast. CONTRAST:  55m ISOVUE-300 IOPAMIDOL (ISOVUE-300) INJECTION 61%, 1059mISOVUE-300 IOPAMIDOL (ISOVUE-300) INJECTION 61% COMPARISON:  08/15/2007 FINDINGS: Lower chest: Tubular branching structures posterior lung bases suggest impacted airways but difficult to assess given the substantial breathing motion artifact. Hepatobiliary: 7 mm hypervascular lesion subcapsular left liver cannot be further characterized. Mild intrahepatic biliary duct prominence. Gallbladder unremarkable. Extrahepatic common bile duct measures 8 mm diameter, increased in  the interval. Pancreas: No focal mass lesion. No dilatation of the main duct. No intraparenchymal cyst. No peripancreatic edema. Spleen: No splenomegaly. No focal mass lesion. Adrenals/Urinary Tract: No adrenal nodule or mass. Cortical scarring noted both kidneys with small renal cysts noted bilaterally. No evidence for hydroureter. Bladder is distended. Stomach/Bowel: Stomach is mildly distended. Duodenum unremarkable. Small bowel loops in the lower abdomen and pelvis are fluid-filled and dilated. A prominently dilated loop in the mid pelvis measures up to 4.6 cm diameter. This dilated loop pin tracts down to the pelvis where a discrete transition point is identified in the right pelvis (see image 53 series 2 and also visible on sagittal image 52 of series 6). I cannot identify dilated small bowel beyond this transition zone to suggest a closed loop etiology. The terminal ileum is collapsed. The appendix is not visualized, but there is no edema or inflammation in the region of the cecum. Diverticuli are seen scattered along the entire length of the colon without CT findings of diverticulitis. Vascular/Lymphatic: There is abdominal aortic atherosclerosis without aneurysm. There is no gastrohepatic or hepatoduodenal ligament lymphadenopathy. No intraperitoneal or retroperitoneal lymphadenopathy. No pelvic sidewall lymphadenopathy. Reproductive: Uterus is surgically absent. There is no adnexal mass. Other: Free fluid is identified around the liver and spleen. Interloop mesenteric fluid is identified in the central small bowel mesentery. Musculoskeletal: Bone windows reveal no worrisome lytic or sclerotic osseous lesions. IMPRESSION: 1. Mechanical small bowel obstruction with small bowel loops dilated up to 4.6 cm diameter and a definite transition zone identified in the right pelvis. Although there is some question of twisting in the region of the transition zone, there is no second transition zone identified nor  dilated small bowel  after the transition zone to suggest a closed loop obstruction. As such, imaging features likely related to an adhesion given the history of hysterectomy. There is fluid around the liver and spleen with interloop mesenteric fluid associated with the abnormal small bowel loops. No small bowel wall thickening or pneumatosis at this time to suggest overt bowel ischemia. 2. Probable airway impaction in both posterior lower lobes although not well assessed given motion artifact. Electronically Signed   By: Misty Stanley M.D.   On: 03/30/2017 21:51   Dg Chest Portable 1 View  Result Date: 03/30/2017 CLINICAL DATA:  Nasogastric tube placement. Generalized abdominal pain and bloating, acute onset. Initial encounter. EXAM: PORTABLE CHEST 1 VIEW COMPARISON:  Chest radiograph performed 06/10/2016 FINDINGS: The patient's enteric tube is seen extending below the diaphragm. The lungs are hyperexpanded, with biapical scarring. Vascular congestion is noted, with increased interstitial markings. Mild interstitial edema cannot be excluded. No pleural effusion or pneumothorax is seen. The cardiomediastinal silhouette is borderline normal in size. No acute osseous abnormalities are seen. Mild degenerative change is noted at the glenohumeral joints bilaterally. IMPRESSION: 1. Enteric tube noted extending below the diaphragm. 2. Lungs hyperexpanded, with biapical scarring, likely reflecting COPD. Underlying vascular congestion, with increased interstitial markings. Mild interstitial edema cannot be excluded. Electronically Signed   By: Garald Balding M.D.   On: 03/30/2017 23:55   Dg Abd 2 Views  Result Date: 03/31/2017 CLINICAL DATA:  Follow-up small bowel obstruction. EXAM: ABDOMEN - 2 VIEW COMPARISON:  03/30/2017 abdominal CT FINDINGS: An NG tube is identified with tip overlying the proximal-mid stomach. Dilated small bowel loops are again noted and not significantly changed. There is no evidence of  pneumoperitoneum. No other changes identified. IMPRESSION: Little significant change of dilated small bowel loops compatible with small bowel obstruction. No evidence of pneumoperitoneum. NG tube with tip overlying the proximal-mid stomach. Electronically Signed   By: Margarette Canada M.D.   On: 03/31/2017 21:12     Labs:   Basic Metabolic Panel:  Recent Labs Lab 04/06/17 0448 04/07/17 0412 04/09/17 0630 04/10/17 0144  NA 145 145 146* 146*  K 3.6 3.4* 3.4* 4.2  CL 97* 96* 98* 99*  CO2 34* 41* 42* 37*  GLUCOSE 146* 115* 101* 112*  BUN 33* 34* 24* 21*  CREATININE 1.14* 1.04* 0.87 0.92  CALCIUM 8.9 8.7* 8.5* 8.7*   GFR Estimated Creatinine Clearance: 29.9 mL/min (by C-G formula based on SCr of 0.92 mg/dL). Liver Function Tests: No results for input(s): AST, ALT, ALKPHOS, BILITOT, PROT, ALBUMIN in the last 168 hours. No results for input(s): LIPASE, AMYLASE in the last 168 hours. No results for input(s): AMMONIA in the last 168 hours. Coagulation profile No results for input(s): INR, PROTIME in the last 168 hours.  CBC:  Recent Labs Lab 04/06/17 0448  WBC 8.5  HGB 12.4  HCT 40.7  MCV 99.3  PLT 218   Cardiac Enzymes:  Recent Labs Lab 04/09/17 1911 04/10/17 0144 04/10/17 0642  TROPONINI <0.03 <0.03 <0.03   BNP: Invalid input(s): POCBNP CBG: No results for input(s): GLUCAP in the last 168 hours. D-Dimer No results for input(s): DDIMER in the last 72 hours. Hgb A1c No results for input(s): HGBA1C in the last 72 hours. Lipid Profile No results for input(s): CHOL, HDL, LDLCALC, TRIG, CHOLHDL, LDLDIRECT in the last 72 hours. Thyroid function studies No results for input(s): TSH, T4TOTAL, T3FREE, THYROIDAB in the last 72 hours.  Invalid input(s): FREET3 Anemia work up No results for input(s): VITAMINB12,  FOLATE, FERRITIN, TIBC, IRON, RETICCTPCT in the last 72 hours. Microbiology Recent Results (from the past 240 hour(s))  MRSA PCR Screening     Status: None    Collection Time: 04/03/17 10:39 AM  Result Value Ref Range Status   MRSA by PCR NEGATIVE NEGATIVE Final    Comment:        The GeneXpert MRSA Assay (FDA approved for NASAL specimens only), is one component of a comprehensive MRSA colonization surveillance program. It is not intended to diagnose MRSA infection nor to guide or monitor treatment for MRSA infections.      Discharge Instructions:   Discharge Instructions    (HEART FAILURE PATIENTS) Call MD:  Anytime you have any of the following symptoms: 1) 3 pound weight gain in 24 hours or 5 pounds in 1 week 2) shortness of breath, with or without a dry hacking cough 3) swelling in the hands, feet or stomach 4) if you have to sleep on extra pillows at night in order to breathe.    Complete by:  As directed    Call MD for:  extreme fatigue    Complete by:  As directed    Call MD for:  persistant dizziness or light-headedness    Complete by:  As directed    Diet - low sodium heart healthy    Complete by:  As directed    Increase activity slowly    Complete by:  As directed      Allergies as of 04/12/2017      Reactions   Vancomycin    Worsening kidney function   Sulfa Antibiotics Palpitations   Unknown      Medication List    STOP taking these medications   amLODipine 10 MG tablet Commonly known as:  NORVASC   bisoprolol-hydrochlorothiazide 5-6.25 MG tablet Commonly known as:  ZIAC   furosemide 20 MG tablet Commonly known as:  LASIX   losartan 100 MG tablet Commonly known as:  COZAAR   Magnesium 400 MG Tabs     TAKE these medications   ALPRAZolam 0.25 MG tablet Commonly known as:  XANAX Take 1 tablet (0.25 mg total) by mouth 3 (three) times daily as needed for anxiety. What changed:  medication strength  See the new instructions.   apixaban 2.5 MG Tabs tablet Commonly known as:  ELIQUIS Take 1 tablet (2.5 mg total) by mouth 2 (two) times daily.   bisacodyl 10 MG suppository Commonly known as:   DULCOLAX Place 1 suppository (10 mg total) rectally every morning.   cholecalciferol 1000 units tablet Commonly known as:  VITAMIN D Take 1,000 Units by mouth daily.   diltiazem 90 MG tablet Commonly known as:  CARDIZEM Take 1 tablet (90 mg total) by mouth every 6 (six) hours.   feeding supplement (ENSURE ENLIVE) Liqd Take 237 mLs by mouth 2 (two) times daily between meals.   ipratropium-albuterol 0.5-2.5 (3) MG/3ML Soln Commonly known as:  DUONEB Take 3 mLs by nebulization every 6 (six) hours as needed.   levothyroxine 50 MCG tablet Commonly known as:  SYNTHROID, LEVOTHROID TAKE 1 TABLET BY MOUTH DAILY OR AS DIRECTED   Metoprolol Tartrate 37.5 MG Tabs Take 37.5 mg by mouth 2 (two) times daily.   mirtazapine 7.5 MG tablet Commonly known as:  REMERON Take 1 tablet (7.5 mg total) by mouth at bedtime.   omeprazole 40 MG capsule Commonly known as:  PRILOSEC TAKE ONE (1) CAPSULE EACH DAY   senna-docusate 8.6-50 MG tablet Commonly known as:  Senokot-S Take 1 tablet by mouth at bedtime.   simethicone 80 MG chewable tablet Commonly known as:  MYLICON Chew 1 tablet (80 mg total) by mouth 2 (two) times daily.   vitamin C 500 MG tablet Commonly known as:  ASCORBIC ACID Take 500 mg by mouth daily.      Contact information for after-discharge care    [facility] Follow up.   Specialty:  Skilled Nursing Facility Contact information: [address] [place] [phone]               Time coordinating discharge: 35 minutes.  Signed:  RAMA,CHRISTINA  Pager [phone] [facility] 04/12/2017, 1:55 PM

## 2017-04-12 NOTE — Progress Notes (Signed)
Report called to [facility], IV cath and PICC line removed. Patient tolerates well. No c/o pain at site or at this time.

## 2017-04-12 NOTE — Consult Note (Signed)
   [facility] CM Inpatient Consult   04/12/2017  Andrea [name] 04-25-1929 [id number]   Patient screened for potential [facility] services. Patient is on the [facility] registry as a benefit of their [employer] . Electronic medical record reveals patient's discharge plan is SNF. [facility] Care Management services not appropriate at this time. If patient's post hospital needs change please place a [facility] Care Management consult. For questions please contact:   Cybill Uriegas RN, [facility] Liaison  [phone]) Business Mobile [phone]) Toll free office

## 2017-04-13 ENCOUNTER — Encounter: Payer: Self-pay | Admitting: Internal Medicine

## 2017-04-13 ENCOUNTER — Non-Acute Institutional Stay (SKILLED_NURSING_FACILITY): Payer: Medicare Other | Admitting: Internal Medicine

## 2017-04-13 DIAGNOSIS — I5032 Chronic diastolic (congestive) heart failure: Secondary | ICD-10-CM

## 2017-04-13 DIAGNOSIS — K56609 Unspecified intestinal obstruction, unspecified as to partial versus complete obstruction: Secondary | ICD-10-CM

## 2017-04-13 DIAGNOSIS — E038 Other specified hypothyroidism: Secondary | ICD-10-CM | POA: Diagnosis not present

## 2017-04-13 DIAGNOSIS — I10 Essential (primary) hypertension: Secondary | ICD-10-CM

## 2017-04-13 DIAGNOSIS — I471 Supraventricular tachycardia: Secondary | ICD-10-CM | POA: Diagnosis not present

## 2017-04-13 DIAGNOSIS — E43 Unspecified severe protein-calorie malnutrition: Secondary | ICD-10-CM

## 2017-04-13 NOTE — Progress Notes (Signed)
Location:   [facility] Room Number: 124/P Place of Service:  SNF [phone]) Provider:  Billey Gosling, MD  Patient Care Team: Unk Pinto, MD as PCP - General (Internal Medicine) Deneise Lever, MD as Consulting Physician (Pulmonary Disease) Lelon Perla, MD as Consulting Physician (Cardiology) Inda Castle, MD as Consulting Physician (Gastroenterology) Rolm Bookbinder, MD as Consulting Physician (Dermatology)  Extended Emergency Contact Information Primary Emergency Contact: [name],[name] Address: [address]          [place] [address] [name] of [place] Phone: [phone] Mobile Phone: [phone] Relation: Spouse Secondary Emergency Contact: Charlene Brooke, [place] Phone: [phone] Mobile Phone: [phone] Relation: Daughter  Code Status:  Full Code Goals of care: Advanced Directive information Advanced Directives 04/13/2017  Does Patient Have a Medical Advance Directive? Yes  Type of Advance Directive (No Data)  Does patient want to make changes to medical advance directive? No - Patient declined  Copy of Universal in Chart? -  Would patient like information on creating a medical advance directive? No - Patient declined  Pre-existing out of facility DNR order (yellow form or pink MOST form) -     Chief Complaint  Patient presents with  . Acute Visit    acute visit   Status post hospitalization for small bowel obstruction  HPI:  Pt is a 83 y.o. female seen today for an acute visit for follow-up on hospitalization for small bowel obstruction.  Patient is an 83 year old female with a history of COPD chronic respiratory failure on chronic oxygen at night-hypertension-hyperlipidemia-diastolic CHF-hypothyroidism-and atrial tachycardia.  She presented to the hospital with a history of abdominal pain and nausea and vomiting.  CT of the abdomen  and pelvis showed a dilated small bowel loop in the mid pelvis with transition zone in the right pelvis-she did receive an NG tube for decompression and Gen. surgery was consulted for management.  Her bowel function improved her tube was removed diet was advanced and she tolerated it she's currently on a dysphagia 2 diet.  Hospitalization was complicated with atrial tachycardia SVT in acute on chronic diastolic CHF as well as aspiration pneumonia-  Regards aspiration pneumonia with left greater than right likely aspiration given recent small bowel obstruction with vomiting she did get a course of Unasyn.  In regards to atrial fibrillation atrial flutter-the thinners were recommended by cardiology initially patient was has noted but this was discussed with patient and her daughter at bedside by hospitalist and she has consented to using the Eloquistt is currently 2.5 mg twice a day.--She continues on Cardizem as well as Lopressor for rate control  She also was thought to have diastolic CHF triggered by atrial fibrillation she had a normal ejection fraction on most recent echo-compensated at present with recommendation for possible diuresis in the future when necessary if needed.  She also had a history of hypokalemia and low magnesium apparently this stabilized in the hospital most recent potassium was 4.2 this will need to be updated.  Regards other medical issues these apparently were stable she is on Synthroid with a history hypothyroidism TSH was 0.936 back on July 9.  Currently patient is resting comfortably in her wheelchair she has no acute complaints vital signs appear to be stable she is not complaining of increased shortness of breath beyond baseline-states she is having regular bowel movements  Past Medical History:  Diagnosis Date  . Aortic regurgitation   . Bronchiectasis ([place])   . COPD (chronic obstructive pulmonary disease) ([place])   . DJD (degenerative joint disease)     . HLD (hyperlipidemia)   . HTN (hypertension)   . Mild mitral regurgitation by prior echocardiogram   . Mild tricuspid regurgitation   . Palpitations    a. has known history of PAC's and PVC's. b. 09/2015: monitor showed sinus rhythm with occasional PAC's and a brief episode of PAT.  [name] Thyroid disease    Past Surgical History:  Procedure Laterality Date  . ABDOMINAL HYSTERECTOMY  1993  . CATARACT EXTRACTION, BILATERAL      Allergies  Allergen Reactions  . Vancomycin     Worsening kidney function  . Sulfa Antibiotics Palpitations    Unknown    Outpatient Encounter Prescriptions as of 04/13/2017  Medication Sig  . ALPRAZolam (XANAX) 0.25 MG tablet Take 0.25 mg by mouth 2 (two) times daily as needed for anxiety. Give for 14 days until 04/26/2017  . apixaban (ELIQUIS) 2.5 MG TABS tablet Take 1 tablet (2.5 mg total) by mouth 2 (two) times daily.  . Ascorbic Acid (VITAMIN C) 500 MG tablet Take 500 mg by mouth daily.    . bisacodyl (DULCOLAX) 10 MG suppository Place 1 suppository (10 mg total) rectally every morning.  . cholecalciferol (VITAMIN D) 1000 UNITS tablet Take 1,000 Units by mouth daily.   [name] diltiazem (CARDIZEM) 90 MG tablet Take 1 tablet (90 mg total) by mouth every 6 (six) hours.  . feeding supplement, ENSURE ENLIVE, (ENSURE ENLIVE) LIQD Take 237 mLs by mouth 2 (two) times daily between meals.  [name] ipratropium-albuterol (DUONEB) 0.5-2.5 (3) MG/3ML SOLN Take 3 mLs by nebulization every 6 (six) hours as needed.  [name] levothyroxine (SYNTHROID, LEVOTHROID) 50 MCG tablet TAKE 1 TABLET BY MOUTH DAILY OR AS DIRECTED  . metoprolol tartrate 37.5 MG TABS Take 37.5 mg by mouth 2 (two) times daily.  . mirtazapine (REMERON) 7.5 MG tablet Take 1 tablet (7.5 mg total) by mouth at bedtime.  [name] omeprazole (PRILOSEC) 40 MG capsule TAKE ONE (1) CAPSULE EACH DAY  . senna-docusate (SENOKOT-S) 8.6-50 MG tablet Take 1 tablet by mouth at bedtime.  . simethicone (MYLICON) 80 MG chewable tablet Chew 1  tablet (80 mg total) by mouth 2 (two) times daily.  . [DISCONTINUED] ALPRAZolam (XANAX) 0.25 MG tablet Take 1 tablet (0.25 mg total) by mouth 3 (three) times daily as needed for anxiety. (Patient taking differently: Take 0.25 mg by mouth 2 (two) times daily as needed for anxiety. )   No facility-administered encounter medications on file as of 04/13/2017.     Review of Systems   \General does not complaining of fever or chills.  Skin does not complain of rashes or itching.  Head ears eyes nose mouth and throat does not complaining of any visual changes or sore throat or difficulty swallowing.  Respiratory history of COPD and aspiration pneumonia still has occasional cough but is not complaining of increased shortness of breath beyond baseline has oxygen at night and appears to need this at times during the day as well.  Cardiac does not complaining of chest pain as fairly minimal lower extremity edema since this comes and goes.  GI is not complaining of abdominal discomfort does have history of small bowel obstruction says she is having regular bowel movements and has not had one today yet  GU does not complain of dysuria or burning with urination.  Musculoskeletal has generalized weakness but is not complaining of joint pain.  Neurologic does not complain of dizziness headache or syncope.  Psych has some history of anxiety but does not appear to be overtly anxious or depressed today continues to be in good spirits  \     Immunization History  Administered Date(s) Administered  . Influenza Split 07/16/2013  . Influenza, High Dose Seasonal PF 06/25/2015, 06/10/2016  . Influenza,inj,Quad PF,36+ Mos 07/04/2014  . Pneumococcal Conjugate-13 02/26/2014  . Pneumococcal Polysaccharide-23 10/04/2016  . Td 06/26/2013   Pertinent  Health Maintenance Due  Topic Date Due  . INFLUENZA VACCINE  04/27/2017  . DEXA SCAN  Completed  . PNA vac Low Risk Adult  Completed   Fall Risk   01/24/2017 09/09/2016 04/07/2016 12/30/2015 09/25/2015  Falls in the past year? _0    Functional Status Survey:     Temperature is 98.3 pulse 73 respirations 17 blood pressure 107/55--weight is 97 pounds Physical Exam in general this is a very pleasant frail elderly female in no distress sitting comfortably in her wheelchair.  Her skin is warm and dry.  Eyes pupils appear reactive to light sclerae and conjunctivae are clear visual acuity appears grossly intact she is.  Oropharynx is clear mucous membranes moist.  Chest she has very shallow air entry could not appreciate any overt congestion there is no labored breathing.  Heart is distant heart sounds from what I could ascertain regular rate and rhythm she has quite minimal lower extremity ankle edema pedal pulses are intact bilaterally.  Abdomen is soft somewhat protuberant nontender with positive bowel sounds.   Musculoskeletal does move all extremities 4 with general frailty.  Neurologic is grossly intact her speech is clear no lateralizing findings.  Psych she is alert and oriented pleasant and appropriate   Labs reviewed:  Recent Labs  04/24/16 0615 04/25/16 0602  04/02/17 0605  04/04/17 0502 04/05/17 0359  04/07/17 0412 04/09/17 0630 04/10/17 0144  NA 134* 138  < > 145  < > 147* 145  < > 145 146* 146*  K 4.3 4.2  < > 3.3*  < > 3.0* 3.5  < > 3.4* 3.4* 4.2  CL 99* 101  < > 105  < > 98* 98*  < > 96* 98* 99*  CO2 28 30  < > 32  < > 40* 40*  < > 41* 42* 37*  GLUCOSE 121* 108*  < > 77  < > 151* 123*  < > 115* 101* 112*  BUN 72* 64*  < > 25*  < > 24* 25*  < > 34* 24* 21*  CREATININE 2.13* 1.63*  < > 0.81  < > 0.93 0.98  < > 1.04* 0.87 0.92  CALCIUM 8.3* 8.7*  < > 8.4*  < > 8.4* 8.9  < > 8.7* 8.5* 8.7*  MG  --   --   < > 1.7  --  1.4* 1.9  --   --   --   --   PHOS 3.6 3.7  --   --   --   --   --   --   --   --   --   < > = values in this interval not displayed.  Recent Labs  01/24/17 1552 03/30/17 1850  03/31/17 0630  AST _1 ALT 8 12* 9*  ALKPHOS 72 75 59  BILITOT 0.5 1.0 1.0  PROT 7.3 8.3* 6.4*  ALBUMIN 4.2 4.2  3.3*    Recent Labs  10/04/16 1549 01/24/17 1552  04/04/17 0502 04/05/17 0359 04/06/17 0448  WBC 7.1 6.4  < > 16.4* 10.4 8.5  NEUTROABS 4,544 3,904  --   --  8.8*  --   HGB 11.9 12.2  < > 11.8* 12.3 12.4  HCT 37.8 38.4  < > 38.2 39.7 40.7  MCV 92.2 92.5  < > 97.0 97.1 99.3  PLT 221 217  < > 192 190 218  < > = values in this interval not displayed. Lab Results  Component Value Date   TSH 0.936 04/04/2017   Lab Results  Component Value Date   HGBA1C 5.4 01/24/2017   Lab Results  Component Value Date   CHOL 224 (H) 01/24/2017   HDL 106 01/24/2017   LDLCALC 104 (H) 01/24/2017   TRIG 72 01/24/2017   CHOLHDL 2.1 01/24/2017    Significant Diagnostic Results in last 30 days:  Dg Abd 1 View  Result Date: 04/01/2017 CLINICAL DATA:  NG tube position. EXAM: ABDOMEN - 1 VIEW COMPARISON:  04/01/2017 FINDINGS: NG tube tip is in the mid stomach with the side port in the proximal stomach. IMPRESSION: NG tube tip in the mid stomach. Electronically Signed   By: Rolm Baptise M.D.   On: 04/01/2017 11:49   Ct Abdomen Pelvis W Contrast  Result Date: 04/08/2017 CLINICAL DATA:  Abdominal pain with nausea and vomiting. Small bowel obstruction. EXAM: CT ABDOMEN AND PELVIS WITH CONTRAST TECHNIQUE: Multidetector CT imaging of the abdomen and pelvis was performed using the standard protocol following bolus administration of intravenous contrast. CONTRAST:  25m ISOVUE-300 IOPAMIDOL (ISOVUE-300) INJECTION 61% COMPARISON:  03/30/2017 FINDINGS: Lower Chest: New small pleural effusions, left side greater than right. New left lower lobe atelectasis versus infiltrate. Mild compressive atelectasis in right lung base. Hepatobiliary: 1 cm homogeneous hypervascular lesion in the anterior left hepatic lobe shows no significant change since recent study. This most likely represents a tiny  flash-filling hemangioma. Tiny sub-cm cyst again seen in the posterior left hepatic lobe. Gallbladder is unremarkable. Pancreas:  No mass or inflammatory changes. Spleen: Within normal limits in size and appearance. Adrenals/Urinary Tract: No masses identified. Tiny bilateral renal cysts again noted. No evidence of hydronephrosis. Stomach/Bowel: There has been near complete resolution of dilated bowel loops since prior study. Diverticulosis again seen involving the descending and sigmoid colon, however there is no evidence of diverticulitis. Vascular/Lymphatic: No pathologically enlarged lymph nodes. No abdominal aortic aneurysm. Aortic atherosclerosis. Reproductive: Prior hysterectomy noted. Adnexal regions are unremarkable in appearance. Other:  None. Musculoskeletal: No suspicious bone lesions identified. Advanced lumbar spondylosis and moderate dextroscoliosis again noted. IMPRESSION: Near complete resolution of dilated small bowel loops since previous study. Colonic diverticulosis, without radiographic evidence of diverticulitis. New small bilateral pleural effusions, left side greater than right, and left lower lobe atelectasis versus infiltrate. Aortic atherosclerosis. Electronically Signed   By: JEarle GellM.D.   On: 04/08/2017 09:40   Ct Abdomen Pelvis W Contrast  Result Date: 03/30/2017 CLINICAL DATA:  Abdominal pain with bloating.  Nausea and vomiting. EXAM: CT ABDOMEN AND PELVIS WITH CONTRAST TECHNIQUE: Multidetector CT imaging of the abdomen and pelvis was performed using the standard protocol following bolus administration of intravenous contrast. CONTRAST:  341mISOVUE-300 IOPAMIDOL (ISOVUE-300) INJECTION 61%, 10027mSOVUE-300 IOPAMIDOL (ISOVUE-300) INJECTION 61% COMPARISON:  08/15/2007 FINDINGS: Lower chest: Tubular branching structures posterior lung bases suggest impacted airways but difficult to assess given the substantial breathing motion artifact. Hepatobiliary: 7 mm hypervascular lesion  subcapsular  left liver cannot be further characterized. Mild intrahepatic biliary duct prominence. Gallbladder unremarkable. Extrahepatic common bile duct measures 8 mm diameter, increased in the interval. Pancreas: No focal mass lesion. No dilatation of the main duct. No intraparenchymal cyst. No peripancreatic edema. Spleen: No splenomegaly. No focal mass lesion. Adrenals/Urinary Tract: No adrenal nodule or mass. Cortical scarring noted both kidneys with small renal cysts noted bilaterally. No evidence for hydroureter. Bladder is distended. Stomach/Bowel: Stomach is mildly distended. Duodenum unremarkable. Small bowel loops in the lower abdomen and pelvis are fluid-filled and dilated. A prominently dilated loop in the mid pelvis measures up to 4.6 cm diameter. This dilated loop pin tracts down to the pelvis where a discrete transition point is identified in the right pelvis (see image 53 series 2 and also visible on sagittal image 52 of series 6). I cannot identify dilated small bowel beyond this transition zone to suggest a closed loop etiology. The terminal ileum is collapsed. The appendix is not visualized, but there is no edema or inflammation in the region of the cecum. Diverticuli are seen scattered along the entire length of the colon without CT findings of diverticulitis. Vascular/Lymphatic: There is abdominal aortic atherosclerosis without aneurysm. There is no gastrohepatic or hepatoduodenal ligament lymphadenopathy. No intraperitoneal or retroperitoneal lymphadenopathy. No pelvic sidewall lymphadenopathy. Reproductive: Uterus is surgically absent. There is no adnexal mass. Other: Free fluid is identified around the liver and spleen. Interloop mesenteric fluid is identified in the central small bowel mesentery. Musculoskeletal: Bone windows reveal no worrisome lytic or sclerotic osseous lesions. IMPRESSION: 1. Mechanical small bowel obstruction with small bowel loops dilated up to 4.6 cm diameter and a  definite transition zone identified in the right pelvis. Although there is some question of twisting in the region of the transition zone, there is no second transition zone identified nor dilated small bowel after the transition zone to suggest a closed loop obstruction. As such, imaging features likely related to an adhesion given the history of hysterectomy. There is fluid around the liver and spleen with interloop mesenteric fluid associated with the abnormal small bowel loops. No small bowel wall thickening or pneumatosis at this time to suggest overt bowel ischemia. 2. Probable airway impaction in both posterior lower lobes although not well assessed given motion artifact. Electronically Signed   By: Misty Stanley M.D.   On: 03/30/2017 21:51   Dg Chest Port 1 View  Result Date: 04/07/2017 CLINICAL DATA:  Vomiting with concern for aspiration pneumonitis EXAM: PORTABLE CHEST 1 VIEW COMPARISON:  April 04, 2017 FINDINGS: There is persistent interstitial pulmonary edema. There is consolidation in the left lower lobe with left pleural effusion stable. There is less airspace opacity in the right base compared to recent study. Small right pleural effusion is present. Heart is mildly enlarged with pulmonary venous hypertension. There is aortic atherosclerosis. No new opacity evident. No adenopathy. There is advanced arthropathy in the right shoulder, stable. IMPRESSION: Slightly less opacity in the right base, likely slightly less pulmonary edema. Changes of congestive heart failure overall persist. Question superimposed pneumonia left lower lobe given the degree of consolidation. Stable left pleural effusion. There is aortic atherosclerosis. No evident. No new opacity evident. Advanced arthropathy right shoulder. Aortic Atherosclerosis (ICD10-I70.0). Electronically Signed   By: Lowella Grip III M.D.   On: 04/07/2017 08:25   Dg Chest Port 1 View  Result Date: 04/04/2017 CLINICAL DATA:  Acute on chronic  diastolic heart failure. EXAM: PORTABLE CHEST 1 VIEW COMPARISON:  Radiograph 04/02/2017, 03/30/2017 FINDINGS:  Stable heart size and mediastinal contours. Stable hazy opacity at both lung bases likely comminution of pleural fluid and atelectasis. Pulmonary edema with slight improvement from prior exam. Biapical pleuroparenchymal scarring again seen. No pneumothorax or new confluent airspace disease. Chronic change in the right shoulder. IMPRESSION: Slight improvement in congestive heart failure with mild decrease in pulmonary edema. Bilateral pleural effusions are unchanged. Electronically Signed   By: Jeb Levering M.D.   On: 04/04/2017 16:01   Dg Chest Port 1 View  Result Date: 04/02/2017 CLINICAL DATA:  Cough. EXAM: PORTABLE CHEST 1 VIEW COMPARISON:  03/30/2017 FINDINGS: 2 frontal radiographs. Nasogastric tube extends beyond the inferior aspect of the film. Patient rotated to the right. Midline trachea. Normal heart size. Layering bilateral pleural effusions are new or significantly increased. Greater on the right. No pneumothorax. progression of moderate interstitial edema. Development of bibasilar airspace disease. IMPRESSION: Worsened aeration, with new or increased moderate congestive heart failure. Development of bilateral pleural effusions and adjacent airspace disease, most likely atelectasis. Infection or aspiration cannot be excluded. Electronically Signed   By: Abigail Miyamoto M.D.   On: 04/02/2017 11:14   Dg Chest Portable 1 View  Result Date: 03/30/2017 CLINICAL DATA:  Nasogastric tube placement. Generalized abdominal pain and bloating, acute onset. Initial encounter. EXAM: PORTABLE CHEST 1 VIEW COMPARISON:  Chest radiograph performed 06/10/2016 FINDINGS: The patient's enteric tube is seen extending below the diaphragm. The lungs are hyperexpanded, with biapical scarring. Vascular congestion is noted, with increased interstitial markings. Mild interstitial edema cannot be excluded. No pleural  effusion or pneumothorax is seen. The cardiomediastinal silhouette is borderline normal in size. No acute osseous abnormalities are seen. Mild degenerative change is noted at the glenohumeral joints bilaterally. IMPRESSION: 1. Enteric tube noted extending below the diaphragm. 2. Lungs hyperexpanded, with biapical scarring, likely reflecting COPD. Underlying vascular congestion, with increased interstitial markings. Mild interstitial edema cannot be excluded. Electronically Signed   By: Garald Balding M.D.   On: 03/30/2017 23:55   Dg Abd 2 Views  Result Date: 04/05/2017 CLINICAL DATA:  Vomiting with nausea EXAM: ABDOMEN - 2 VIEW COMPARISON:  04/03/2017 FINDINGS: Small bilateral effusions with dense left lower lobe consolidation and patchy infiltrate at the right base. No definite free air beneath the diaphragm. Some contrast present in the colon. Contrast opacifies dilated small bowel in the lower abdomen and pelvis, measuring up to 4 cm. IMPRESSION: Dilated loops of small bowel containing contrast within the lower abdomen and pelvis consistent with bowel obstruction. There is a small amount of contrast present in the colon. Small bilateral effusions and left greater than right bibasilar infiltrates Electronically Signed   By: Donavan Foil M.D.   On: 04/05/2017 21:07   Dg Abd 2 Views  Result Date: 04/03/2017 CLINICAL DATA:  Patient with history of small bowel obstruction. EXAM: ABDOMEN - 2 VIEW COMPARISON:  Abdominal radiograph 04/01/2017 FINDINGS: Enteric tube tip and side-port project over the stomach. Oral contrast material within the colon. Stable gaseous distended loops of small bowel within the central abdomen. Heterogeneous opacities lung bases bilaterally. Small bilateral pleural effusions. IMPRESSION: Enteric tube tip and side-port project over the stomach. Unchanged gaseous distended loops of bowel within the central abdomen. Layering effusions and underlying opacities. Electronically Signed   By:  Lovey Newcomer M.D.   On: 04/03/2017 09:13   Dg Abd 2 Views  Result Date: 04/01/2017 CLINICAL DATA:  Followup small bowel obstruction. EXAM: ABDOMEN - 2 VIEW COMPARISON:  03/31/2017 FINDINGS: There are persistent loops of dilated  central small bowel with air-fluid levels consistent with a partial small bowel obstruction, similar to the prior study. There is no free air. Nasogastric tube extends below the diaphragm into the mid stomach. IMPRESSION: 1. Persistent partial small bowel obstruction. No significant change from the prior study. No free air. Electronically Signed   By: Lajean Manes M.D.   On: 04/01/2017 08:42   Dg Abd 2 Views  Result Date: 03/31/2017 CLINICAL DATA:  Follow-up small bowel obstruction. EXAM: ABDOMEN - 2 VIEW COMPARISON:  03/30/2017 abdominal CT FINDINGS: An NG tube is identified with tip overlying the proximal-mid stomach. Dilated small bowel loops are again noted and not significantly changed. There is no evidence of pneumoperitoneum. No other changes identified. IMPRESSION: Little significant change of dilated small bowel loops compatible with small bowel obstruction. No evidence of pneumoperitoneum. NG tube with tip overlying the proximal-mid stomach. Electronically Signed   By: Margarette Canada M.D.   On: 03/31/2017 21:12   Dg Swallowing Func-speech Pathology  Result Date: 04/05/2017 Objective Swallowing Evaluation: Type of Study: MBS-Modified Barium Swallow Study Patient Details Name: Andrea [name] MRN: [id number] Date of Birth: June 05, 1929 Today's Date: 04/05/2017 Time: SLP Start Time (ACUTE ONLY): 0805-SLP Stop Time (ACUTE ONLY): 0819 SLP Time Calculation (min) (ACUTE ONLY): 14 min Past Medical History: Past Medical History: Diagnosis Date . Aortic regurgitation  . Bronchiectasis ([place])  . COPD (chronic obstructive pulmonary disease) ([place])  . DJD (degenerative joint disease)  . HLD (hyperlipidemia)  . HTN (hypertension)  . Mild mitral regurgitation by prior echocardiogram  . Mild  tricuspid regurgitation  . Palpitations   a. has known history of PAC's and PVC's. b. 09/2015: monitor showed sinus rhythm with occasional PAC's and a brief episode of PAT. [name] Thyroid disease  Past Surgical History: Past Surgical History: Procedure Laterality Date . ABDOMINAL HYSTERECTOMY  1993 . CATARACT EXTRACTION, BILATERAL   HPI: 83 year old female with a history of bronchiectasis/COPD, chronic respiratory failure on 2 L at night, hypertension, hyperlipidemia, diastolic CHF, hypothyroidism, and PATpresented with a one to two-day history of abdominal pain with associated nausea and vomiting. The patient denied any fevers, chills, chest pain, shortness breath, diarrhea, hematochezia, melena. CT of the abdomen and pelvis at the time of admission showed dilated small bowel loops up to 4.6 cm in the mid pelvis with a transition zone in the right pelvis. NG tube was inserted for decompression and Gen. surgery was consulted to assist with management. Overall, her bowel function has improved, and her NG tube was discontinued 7/8. Her diet was advanced which she tolerated. Her hospitalization has been Located by atrial tachycardia/atrial fibrillation, acute on chronic diastolic CHF, and aspiration pneumonia. Pt and family report continued swallowing difficulty. ST will evaluate and treat as indicated. No Data Recorded Assessment / Plan / Recommendation CHL IP CLINICAL IMPRESSIONS 04/05/2017 Clinical Impression Pt presents with an inconsistent mild to moderate oropharyngeal dysphagia. Oral phase characterized by decreased bolus cohesion of solids and delayed oral transit. Swallow initiation delayed throughout PO trials, to the level of the valleculae with puree and solids, and to the level of the pyriform sinuses with thin and nectar thick liquids. Prominent vallecular residuals evidenced secondary to reduced tongue base retraction, which were increased with solids and thickened consistencies. Thin liquid alternation aided  in clearing vallecular residuals.  Inconsistent penetration occured with thin liquids before the swallow that intermittently reached the level of the vocal cords and was cleared out of laryngeal vestibule intermittently. Volitional throat clear aided in expelling penetrates with  cueing from SLP. Nectar thick liquids without penetration. As PO trials progressed pt did evidence cup and straw sips of thin liquids without penetration. Barium tablet administered with thin liquids. Pt displayed difficulty propelling tablet throughout pharynx, initially sticking in the valleculae, then the pyriform sinuses, ultimately requiring nectar thick liquids to clear through pharynx. As pt was struggling to pass pill throughout pharynx, silent aspiration of thin liquids occured after the swallow. Recommend chopped consistencies with thin liquids in isolation. Pt advised to avoid mixed consistencies as airway protection appears compromised when two consistencies administered at the same time. Recommend medicines whole in puree (crush larger meds as needed). ST to follow up for diet tolerance and education with family for safe swallow strategies  SLP Visit Diagnosis Dysphagia, oropharyngeal phase (R13.12) Attention and concentration deficit following -- Frontal lobe and executive function deficit following -- Impact on safety and function Moderate aspiration risk   CHL IP TREATMENT RECOMMENDATION 04/05/2017 Treatment Recommendations Therapy as outlined in treatment plan below   Prognosis 04/05/2017 Prognosis for Safe Diet Advancement Good Barriers to Reach Goals -- Barriers/Prognosis Comment -- [facility]L IP DIET RECOMMENDATION 04/05/2017 SLP Diet Recommendations Dysphagia 2 (Fine chop) solids;Thin liquid;No mixed consistencies Liquid Administration via -- Medication Administration Whole meds with puree Compensations Minimize environmental distractions;Slow rate;Small sips/bites;Multiple dry swallows after each bite/sip;Follow solids with  liquid;Clear throat intermittently Postural Changes Remain semi-upright after after feeds/meals (Comment);Seated upright at 90 degrees   CHL IP OTHER RECOMMENDATIONS 04/05/2017 Recommended Consults -- Oral Care Recommendations Oral care BID Other Recommendations --   [facility]L IP FOLLOW UP RECOMMENDATIONS 04/05/2017 Follow up Recommendations 24 hour supervision/assistance   CHL IP FREQUENCY AND DURATION 04/05/2017 Speech Therapy Frequency (ACUTE ONLY) min 2x/week Treatment Duration 2 weeks      CHL IP ORAL PHASE 04/05/2017 Oral Phase Impaired Oral - Pudding Teaspoon -- Oral - Pudding Cup -- Oral - Honey Teaspoon -- Oral - Honey Cup -- Oral - Nectar Teaspoon Delayed oral transit;Lingual/palatal residue Oral - Nectar Cup Delayed oral transit;Lingual/palatal residue;Weak lingual manipulation Oral - Nectar Straw Weak lingual manipulation;Delayed oral transit;Lingual/palatal residue Oral - Thin Teaspoon Weak lingual manipulation;Delayed oral transit;Lingual/palatal residue Oral - Thin Cup Weak lingual manipulation;Delayed oral transit;Lingual/palatal residue Oral - Thin Straw Delayed oral transit;Weak lingual manipulation;Lingual/palatal residue Oral - Puree Delayed oral transit;Weak lingual manipulation;Lingual pumping Oral - Mech Soft -- Oral - Regular Lingual/palatal residue;Piecemeal swallowing;Delayed oral transit;Decreased bolus cohesion;Weak lingual manipulation Oral - Multi-Consistency -- Oral - Pill Lingual pumping;Reduced posterior propulsion;Delayed oral transit;Piecemeal swallowing;Lingual/palatal residue Oral Phase - Comment --  CHL IP PHARYNGEAL PHASE 04/05/2017 Pharyngeal Phase Impaired Pharyngeal- Pudding Teaspoon -- Pharyngeal -- Pharyngeal- Pudding Cup -- Pharyngeal -- Pharyngeal- Honey Teaspoon -- Pharyngeal -- Pharyngeal- Honey Cup -- Pharyngeal -- Pharyngeal- Nectar Teaspoon Reduced tongue base retraction;Delayed swallow initiation-vallecula;Delayed swallow initiation-pyriform sinuses;Pharyngeal residue -  valleculae Pharyngeal -- Pharyngeal- Nectar Cup Delayed swallow initiation-vallecula;Delayed swallow initiation-pyriform sinuses;Reduced tongue base retraction;Pharyngeal residue - valleculae Pharyngeal -- Pharyngeal- Nectar Straw -- Pharyngeal -- Pharyngeal- Thin Teaspoon Delayed swallow initiation-vallecula;Penetration/Aspiration during swallow;Penetration/Aspiration before swallow;Pharyngeal residue - valleculae;Reduced tongue base retraction;Reduced epiglottic inversion Pharyngeal Material enters airway, CONTACTS cords and then ejected out Pharyngeal- Thin Cup Delayed swallow initiation-vallecula;Reduced tongue base retraction;Pharyngeal residue - valleculae;Penetration/Aspiration during swallow;Penetration/Aspiration before swallow Pharyngeal Material enters airway, CONTACTS cords and not ejected out Pharyngeal- Thin Straw Penetration/Aspiration during swallow;Penetration/Aspiration before swallow;Delayed swallow initiation-vallecula;Reduced tongue base retraction;Pharyngeal residue - valleculae Pharyngeal Material enters airway, remains ABOVE vocal cords and not ejected out Pharyngeal- Puree Delayed swallow initiation-vallecula;Pharyngeal residue - valleculae;Reduced tongue base retraction  Pharyngeal -- Pharyngeal- Mechanical Soft -- Pharyngeal -- Pharyngeal- Regular Delayed swallow initiation-vallecula;Pharyngeal residue - valleculae;Reduced tongue base retraction Pharyngeal -- Pharyngeal- Multi-consistency -- Pharyngeal -- Pharyngeal- Pill Delayed swallow initiation-vallecula;Other (Comment);Penetration/Apiration after swallow Pharyngeal Material enters airway, passes BELOW cords and not ejected out despite cough attempt by patient Pharyngeal Comment --  No flowsheet data found. Arvil Chaco MA, CCC-SLP Acute Care Speech Language Pathologist  Levi Aland 04/05/2017, 9:48 AM               Assessment/Plan  #1 history of small bowel obstruction apparently this has resolved status post NG tube-she is  tolerating a dysphagia 2 diet.--Apparently having regular bowel movements continues on continues on Senokot as well as Dulcolax monitor for diarrhea  This is complicated with protein calorie malnutrition-supplements are being actively encouraged she is followed by dietary this will have to be monitored.--She is on Remeron it appears for appetite stimulation  #2 history of A. fib and atrial tachycardia she continues on diltiazem as well as Lopressor at this point rate appears to be controlled she is on Eliquis for anticoagulation-recommendations do a weekly CBC while she is on this.  #3-history of aspiration pneumonia with COPD she did receive a course of antibiotics in the hospital apparently this resolved per most recent chest x-ray-she continues on nebulizers when necessary-she has also on chronic oxygen at night and per discussion with therapist today may need this when necessary during the day as well.  #4-history of acute on chronic diastolic CHF again this was thought possibly secondary to atrial fibrillation-continue to monitor weights daily notify  provider of gain greater than 3 pounds she is on a beta blocker.  #5-history of hypertension so far this appears to be stable on Lopressor and Cardizem.  #6 history hypothyroidism TSH was within normal limits hospital she is on Synthroid.  #7 history of mild hypernatremia appears her baseline sodium has been in the 145-146 range in the hospital this will warrant updating as well.  #8 history of anxiety she continues on Xanax twice a day at this point will monitor.  #9 history of vitamin D deficiency she is on supplementation vitamin D level was 52 on lab done on 01/24/2017.  #10 history of low magnesium this appears to have normalized at 1.9 on most recent lab done on 04/05/2017.  Of note a CBC and BMP has been ordered for tomorrow for follow-up again of her potassium-as well as hemoglobin being on a Eliquis Recommendation is for a CBC  weekly while on the Eliquis   [id number]-of note greater than 40 minutes spent assessing patient-reviewing her chart-reviewing her labs-discussing her status with her daughter at bedside-and coordinating and formulating a plan of care for numerous diagnoses-of note greater than 50% of time spent coordinating plan of care

## 2017-04-14 ENCOUNTER — Encounter: Payer: Self-pay | Admitting: Internal Medicine

## 2017-04-14 ENCOUNTER — Non-Acute Institutional Stay (SKILLED_NURSING_FACILITY): Payer: Medicare Other | Admitting: Internal Medicine

## 2017-04-14 ENCOUNTER — Encounter (HOSPITAL_COMMUNITY)
Admission: RE | Admit: 2017-04-14 | Discharge: 2017-04-14 | Disposition: A | Discharge status: Home / Self Care | Payer: Medicare Other | Source: Skilled Nursing Facility | Attending: *Deleted | Admitting: *Deleted

## 2017-04-14 DIAGNOSIS — E559 Vitamin D deficiency, unspecified: Secondary | ICD-10-CM

## 2017-04-14 DIAGNOSIS — E038 Other specified hypothyroidism: Secondary | ICD-10-CM

## 2017-04-14 DIAGNOSIS — I5032 Chronic diastolic (congestive) heart failure: Secondary | ICD-10-CM | POA: Insufficient documentation

## 2017-04-14 DIAGNOSIS — I5033 Acute on chronic diastolic (congestive) heart failure: Secondary | ICD-10-CM | POA: Diagnosis not present

## 2017-04-14 DIAGNOSIS — K56609 Unspecified intestinal obstruction, unspecified as to partial versus complete obstruction: Secondary | ICD-10-CM | POA: Insufficient documentation

## 2017-04-14 DIAGNOSIS — N289 Disorder of kidney and ureter, unspecified: Secondary | ICD-10-CM

## 2017-04-14 DIAGNOSIS — E039 Hypothyroidism, unspecified: Secondary | ICD-10-CM | POA: Insufficient documentation

## 2017-04-14 DIAGNOSIS — J438 Other emphysema: Secondary | ICD-10-CM

## 2017-04-14 DIAGNOSIS — R262 Difficulty in walking, not elsewhere classified: Secondary | ICD-10-CM | POA: Insufficient documentation

## 2017-04-14 DIAGNOSIS — J439 Emphysema, unspecified: Secondary | ICD-10-CM | POA: Insufficient documentation

## 2017-04-14 DIAGNOSIS — I10 Essential (primary) hypertension: Secondary | ICD-10-CM | POA: Diagnosis not present

## 2017-04-14 DIAGNOSIS — I480 Paroxysmal atrial fibrillation: Secondary | ICD-10-CM

## 2017-04-14 DIAGNOSIS — M6281 Muscle weakness (generalized): Secondary | ICD-10-CM | POA: Insufficient documentation

## 2017-04-14 LAB — CBC WITH DIFFERENTIAL/PLATELET
Basophils Absolute: 0 10*3/uL (ref 0.0–0.1)
Basophils Relative: 0 %
EOS ABS: 0.2 10*3/uL (ref 0.0–0.7)
Eosinophils Relative: 2 %
HEMATOCRIT: 33.4 % — AB (ref 36.0–46.0)
HEMOGLOBIN: 10.7 g/dL — AB (ref 12.0–15.0)
LYMPHS ABS: 1.2 10*3/uL (ref 0.7–4.0)
LYMPHS PCT: 13 %
MCH: 30.3 pg (ref 26.0–34.0)
MCHC: 32 g/dL (ref 30.0–36.0)
MCV: 94.6 fL (ref 78.0–100.0)
MONOS PCT: 8 %
Monocytes Absolute: 0.7 10*3/uL (ref 0.1–1.0)
NEUTROS PCT: 77 %
Neutro Abs: 7.1 10*3/uL (ref 1.7–7.7)
Platelets: 259 10*3/uL (ref 150–400)
RBC: 3.53 MIL/uL — ABNORMAL LOW (ref 3.87–5.11)
RDW: 13 % (ref 11.5–15.5)
WBC: 9.3 10*3/uL (ref 4.0–10.5)

## 2017-04-14 LAB — BASIC METABOLIC PANEL
Anion gap: 7 (ref 5–15)
BUN: 18 mg/dL (ref 6–20)
CHLORIDE: 98 mmol/L — AB (ref 101–111)
CO2: 33 mmol/L — AB (ref 22–32)
CREATININE: 1.07 mg/dL — AB (ref 0.44–1.00)
Calcium: 8.4 mg/dL — ABNORMAL LOW (ref 8.9–10.3)
GFR calc Af Amer: 53 mL/min — ABNORMAL LOW (ref >60)
GFR calc non Af Amer: 46 mL/min — ABNORMAL LOW (ref >60)
GLUCOSE: 98 mg/dL (ref 65–99)
Potassium: 3.8 mmol/L (ref 3.5–5.1)
SODIUM: 138 mmol/L (ref 135–145)

## 2017-04-14 NOTE — Progress Notes (Signed)
Provider:Lyndee Herbst,Chirstine Defrain Location:    [facility] Room Number: 124/P Place of Service:  SNF (31)  PCP: Unk Pinto, MD Patient Care Team: Unk Pinto, MD as PCP - General (Internal Medicine) Deneise Lever, MD as Consulting Physician (Pulmonary Disease) Stanford Breed Denice Bors, MD as Consulting Physician (Cardiology) Inda Castle, MD as Consulting Physician (Gastroenterology) Rolm Bookbinder, MD as Consulting Physician (Dermatology)  Extended Emergency Contact Information Primary Emergency Contact: Mccasland,[name] Address: [phone]          [place] [address] [name] of [place] Phone: [phone] Mobile Phone: [phone] Relation: Spouse Secondary Emergency Contact: Charlene Brooke, Keddie [place] of [place] Phone: [phone] Mobile Phone: [phone] Relation: Daughter  Code Status: Full Code Goals of Care: Advanced Directive information Advanced Directives 04/14/2017  Does Patient Have a Medical Advance Directive? Yes  Type of Advance Directive (No Data)  Does patient want to make changes to medical advance directive? No - Patient declined  Copy of [place] in Chart? -  Would patient like information on creating a medical advance directive? No - Patient declined  Pre-existing out of facility DNR order (yellow form or pink MOST form) -      Chief Complaint  Patient presents with  . New Admit To SNF    HPI: Patient is a 83 y.o. female seen today for admission to SNF for therapy.  Patient has h/o COPD on Oxygen at night followed by Dr young, Hypertension, Hypothyroidism, Mild Aortic Insufficiency, Anxiety , Vit D deficiency, and PAT  She presented to ED with Abdominal pain, Nausea , vomiting. She was found to have Small Bowel Obstruction. She was managed Conservatively with NG tube, IV fluids NPO. eventually her bowel function improved and she also started to have Bowel movements She  also was tolerating PO Diet. In the hospital she also had episodes of rapid Atrial fibrillation and was started on Eliquis, and Cardizem, She did convert spontaneously to NSR on  She also was treated for ? Aspiration pneumonia with Unisyn. She was started on Dysphagic diet as she was having swallowing problems once NG was pulled. Patient went into CHF thought to be due to PAF.  Patient is doing well in facility. Did c/o Dry mouth and hoarseness. She is eating well and did move her bowels twice since she has been here. She is able to walk with the walker. No cough or SOB. She is eating well.    Past Medical History:  Diagnosis Date  . Aortic regurgitation   . Bronchiectasis ([place])   . COPD (chronic obstructive pulmonary disease) ([place])   . DJD (degenerative joint disease)   . HLD (hyperlipidemia)   . HTN (hypertension)   . Mild mitral regurgitation by prior echocardiogram   . Mild tricuspid regurgitation   . Palpitations    a. has known history of PAC's and PVC's. b. 09/2015: monitor showed sinus rhythm with occasional PAC's and a brief episode of PAT.  [name] Thyroid disease    Past Surgical History:  Procedure Laterality Date  . ABDOMINAL HYSTERECTOMY  1993  . CATARACT EXTRACTION, BILATERAL      reports that she quit smoking about 42 years ago. Her smoking use included Cigarettes. She has a 20.00 pack-year smoking history. She has never used smokeless tobacco. She reports that she does not drink alcohol or use drugs. Social History   Social History  . Marital status: Married  Spouse name: N/A  . Number of children: 5  . Years of education: N/A   Occupational History  . retired    Social History Main Topics  . Smoking status: Former Smoker    Packs/day: 1.00    Years: 20.00    Types: Cigarettes    Quit date: 09/27/1974  . Smokeless tobacco: Never Used  . Alcohol use No  . Drug use: No  . Sexual activity: No   Other Topics Concern  . Not on file   Social History  Narrative  . No narrative on file    Functional Status Survey:    Family History  Problem Relation Age of Onset  . Emphysema Mother   . Diabetes Mother   . Cancer Father   . Emphysema Sister   . Emphysema Sister   . Rheumatic fever Sister   . Heart attack Sister   . Diabetes Sister   . Diabetes Brother   . Asthma Brother        as a child    Health Maintenance  Topic Date Due  . INFLUENZA VACCINE  04/27/2017  . TETANUS/TDAP  06/27/2023  . DEXA SCAN  Completed  . PNA vac Low Risk Adult  Completed    Allergies  Allergen Reactions  . Vancomycin     Worsening kidney function  . Sulfa Antibiotics Palpitations    Unknown    Outpatient Encounter Prescriptions as of 04/14/2017  Medication Sig  . ALPRAZolam (XANAX) 0.25 MG tablet Take 0.25 mg by mouth 2 (two) times daily as needed for anxiety. Give for 14 days until 04/26/2017  . apixaban (ELIQUIS) 2.5 MG TABS tablet Take 1 tablet (2.5 mg total) by mouth 2 (two) times daily.  . Ascorbic Acid (VITAMIN C) 500 MG tablet Take 500 mg by mouth daily.    . bisacodyl (DULCOLAX) 10 MG suppository Place 1 suppository (10 mg total) rectally every morning.  . cholecalciferol (VITAMIN D) 1000 UNITS tablet Take 1,000 Units by mouth daily.   [name] diltiazem (CARDIZEM) 90 MG tablet Take 1 tablet (90 mg total) by mouth every 6 (six) hours.  . feeding supplement, ENSURE ENLIVE, (ENSURE ENLIVE) LIQD Take 237 mLs by mouth 2 (two) times daily between meals.  [name] ipratropium-albuterol (DUONEB) 0.5-2.5 (3) MG/3ML SOLN Take 3 mLs by nebulization every 6 (six) hours as needed.  [name] levothyroxine (SYNTHROID, LEVOTHROID) 50 MCG tablet TAKE 1 TABLET BY MOUTH DAILY OR AS DIRECTED  . metoprolol tartrate 37.5 MG TABS Take 37.5 mg by mouth 2 (two) times daily.  . mirtazapine (REMERON) 7.5 MG tablet Take 1 tablet (7.5 mg total) by mouth at bedtime.  [name] omeprazole (PRILOSEC) 40 MG capsule TAKE ONE (1) CAPSULE EACH DAY  . senna-docusate (SENOKOT-S) 8.6-50 MG tablet  Take 1 tablet by mouth at bedtime.  . simethicone (MYLICON) 80 MG chewable tablet Chew 1 tablet (80 mg total) by mouth 2 (two) times daily.   No facility-administered encounter medications on file as of 04/14/2017.      Review of Systems  Review of Systems  Constitutional: Negative for activity change, appetite change, chills, diaphoresis, fatigue and fever.  HENT: Negative for mouth sores, postnasal drip, rhinorrhea, sinus pain and sore throat.   Respiratory: Negative for apnea, cough, chest tightness, shortness of breath and wheezing.   Cardiovascular: Negative for chest pain, palpitations and leg swelling.  Gastrointestinal: Negative for abdominal distention, abdominal pain, constipation, diarrhea, nausea and vomiting.  Genitourinary: Negative for dysuria and frequency.  Musculoskeletal: Negative for arthralgias,  joint swelling and myalgias.  Skin: Negative for rash.  Neurological: Negative for dizziness, syncope, weakness, light-headedness and numbness.  Psychiatric/Behavioral: Negative for behavioral problems, confusion and sleep disturbance.     Vitals:   04/14/17 1017  BP: (!) 108/58  Pulse: 69  Resp: 17  Temp: 98.3 F (36.8 C)  TempSrc: Oral  SpO2: 90%   There is no height or weight on file to calculate BMI. Physical Exam  Constitutional: She is oriented to person, place, and time. She appears well-developed. She appears cachectic.  HENT:  Head: Normocephalic.  Mouth/Throat: Mucous membranes are dry.  Eyes: Pupils are equal, round, and reactive to light.  Neck: Neck supple.  Cardiovascular: Normal rate, regular rhythm and normal heart sounds.   No murmur heard. Pulmonary/Chest: Effort normal and breath sounds normal. No respiratory distress. She has no wheezes. She has no rales.  Abdominal: Soft. Bowel sounds are normal. She exhibits no distension. There is no tenderness. There is no rebound.  Musculoskeletal: She exhibits no edema.  Neurological: She is alert and  oriented to person, place, and time.  No focal deficit. Cannot move right arm due to arthritis  Skin: Skin is warm and dry.  Psychiatric: She has a normal mood and affect. Her behavior is normal. Thought content normal.    Labs reviewed: Basic Metabolic Panel:  Recent Labs  04/24/16 0615 04/25/16 0602  04/02/17 0605  04/04/17 0502 04/05/17 0359  04/09/17 0630 04/10/17 0144 04/14/17 0615  NA 134* 138  < > 145  < > 147* 145  < > 146* 146* 138  K 4.3 4.2  < > 3.3*  < > 3.0* 3.5  < > 3.4* 4.2 3.8  CL 99* 101  < > 105  < > 98* 98*  < > 98* 99* 98*  CO2 28 30  < > 32  < > 40* 40*  < > 42* 37* 33*  GLUCOSE 121* 108*  < > 77  < > 151* 123*  < > 101* 112* 98  BUN 72* 64*  < > 25*  < > 24* 25*  < > 24* 21* 18  CREATININE 2.13* 1.63*  < > 0.81  < > 0.93 0.98  < > 0.87 0.92 1.07*  CALCIUM 8.3* 8.7*  < > 8.4*  < > 8.4* 8.9  < > 8.5* 8.7* 8.4*  MG  --   --   < > 1.7  --  1.4* 1.9  --   --   --   --   PHOS 3.6 3.7  --   --   --   --   --   --   --   --   --   < > = values in this interval not displayed. Liver Function Tests:  Recent Labs  01/24/17 1552 03/30/17 1850 03/31/17 0630  AST _0 ALT 8 12* 9*  ALKPHOS 72 75 59  BILITOT 0.5 1.0 1.0  PROT 7.3 8.3* 6.4*  ALBUMIN 4.2 4.2 3.3*    Recent Labs  03/30/17 1850  LIPASE 47   No results for input(s): AMMONIA in the last 8760 hours. CBC:  Recent Labs  01/24/17 1552  04/05/17 0359 04/06/17 0448 04/14/17 0615  WBC 6.4  < > 10.4 8.5 9.3  NEUTROABS 3,904  --  8.8*  --  7.1  HGB 12.2  < > 12.3 12.4 10.7*  HCT 38.4  < > 39.7 40.7 33.4*  MCV 92.5  < > 97.1 99.3  94.6  PLT 217  < > 190 218 259  < > = values in this interval not displayed. Cardiac Enzymes:  Recent Labs  04/09/17 1911 04/10/17 0144 04/10/17 0642  TROPONINI <0.03 <0.03 <0.03   BNP: Invalid input(s): POCBNP Lab Results  Component Value Date   HGBA1C 5.4 01/24/2017   Lab Results  Component Value Date   TSH 0.936 04/04/2017   No results found  for: VITAMINB12 No results found for: FOLATE Lab Results  Component Value Date   IRON 107 09/17/2014   TIBC 323 09/17/2014   FERRITIN 123 09/17/2014    Imaging and Procedures obtained prior to SNF admission: No results found.  Assessment/Plan Small bowel obstruction [facility]) Patient doing well. She is taking PO and moving her Bowels. Continue on Dulcolax and senokot.   Acute on chronic diastolic CHF Worsening due to PAF. Patient was on PRN lasix at home which was stopped in the hospital.. Echo showed Normal with EF of 60%  Paroxysmal Atrial fibrillation She was started on Eliquis in the hospital Also on Cardizem and metoprolol. Patient daughter concerned about Bleeding as patient has h/o GI bleed in past.  Essential hypertension Controlled on Caredizem and metoprolol. Was taken off Losartan and Norvasc COPD On oxygen at night. She is on Nebs and stable   hypothyroidism TSH normal in the hospital Continue same dose of Synthyroid  Vitamin D deficiency On supplement  Renal insufficiency Creat was elevated initially and is now close to baseline  Malnutrition and Weight loss. D/W the daughter. Patient always has been small. Did loose some weight during this illness. No recent weight loss. Was started on Remeron in the hospital. Also is on Protein supplement. D/W therapy They will evaluate her and advance her diet as needed. Anxiety On low dose of Xanax   Family/ staff Communication:   Labs/tests ordered:  Total time spent in this patient care encounter was 45_ minutes; greater than 50% of the visit spent counseling patient and her daughter and coordinating care for problems addressed at this encounter.

## 2017-04-18 ENCOUNTER — Encounter (HOSPITAL_COMMUNITY)
Admission: RE | Admit: 2017-04-18 | Discharge: 2017-04-18 | Disposition: A | Discharge status: Home / Self Care | Payer: Medicare Other | Source: Skilled Nursing Facility | Attending: Internal Medicine | Admitting: Internal Medicine

## 2017-04-18 LAB — COMPREHENSIVE METABOLIC PANEL
ALBUMIN: 2.5 g/dL — AB (ref 3.5–5.0)
ALK PHOS: 58 U/L (ref 38–126)
ALT: 12 U/L — ABNORMAL LOW (ref 14–54)
ANION GAP: 8 (ref 5–15)
AST: 18 U/L (ref 15–41)
BUN: 21 mg/dL — ABNORMAL HIGH (ref 6–20)
CALCIUM: 8.8 mg/dL — AB (ref 8.9–10.3)
CO2: 32 mmol/L (ref 22–32)
Chloride: 99 mmol/L — ABNORMAL LOW (ref 101–111)
Creatinine, Ser: 1.09 mg/dL — ABNORMAL HIGH (ref 0.44–1.00)
GFR calc Af Amer: 52 mL/min — ABNORMAL LOW (ref >60)
GFR calc non Af Amer: 45 mL/min — ABNORMAL LOW (ref >60)
GLUCOSE: 88 mg/dL (ref 65–99)
Potassium: 3.7 mmol/L (ref 3.5–5.1)
SODIUM: 139 mmol/L (ref 135–145)
Total Bilirubin: 0.4 mg/dL (ref 0.3–1.2)
Total Protein: 5.8 g/dL — ABNORMAL LOW (ref 6.5–8.1)

## 2017-04-18 LAB — CBC
HCT: 33.6 % — ABNORMAL LOW (ref 36.0–46.0)
HEMOGLOBIN: 10.5 g/dL — AB (ref 12.0–15.0)
MCH: 29.3 pg (ref 26.0–34.0)
MCHC: 31.3 g/dL (ref 30.0–36.0)
MCV: 93.9 fL (ref 78.0–100.0)
Platelets: 361 10*3/uL (ref 150–400)
RBC: 3.58 MIL/uL — AB (ref 3.87–5.11)
RDW: 13.3 % (ref 11.5–15.5)
WBC: 8.1 10*3/uL (ref 4.0–10.5)

## 2017-04-20 ENCOUNTER — Encounter (HOSPITAL_COMMUNITY)
Admission: RE | Admit: 2017-04-20 | Discharge: 2017-04-20 | Disposition: A | Discharge status: Home / Self Care | Payer: Medicare Other | Source: Skilled Nursing Facility | Attending: *Deleted | Admitting: *Deleted

## 2017-04-20 DIAGNOSIS — E039 Hypothyroidism, unspecified: Secondary | ICD-10-CM | POA: Insufficient documentation

## 2017-04-20 DIAGNOSIS — R262 Difficulty in walking, not elsewhere classified: Secondary | ICD-10-CM | POA: Insufficient documentation

## 2017-04-20 DIAGNOSIS — M6281 Muscle weakness (generalized): Secondary | ICD-10-CM | POA: Insufficient documentation

## 2017-04-20 DIAGNOSIS — K56609 Unspecified intestinal obstruction, unspecified as to partial versus complete obstruction: Secondary | ICD-10-CM | POA: Insufficient documentation

## 2017-04-20 DIAGNOSIS — J439 Emphysema, unspecified: Secondary | ICD-10-CM | POA: Insufficient documentation

## 2017-04-22 ENCOUNTER — Non-Acute Institutional Stay (SKILLED_NURSING_FACILITY): Payer: Medicare Other | Admitting: Internal Medicine

## 2017-04-22 ENCOUNTER — Encounter: Payer: Self-pay | Admitting: Internal Medicine

## 2017-04-22 DIAGNOSIS — R635 Abnormal weight gain: Secondary | ICD-10-CM | POA: Diagnosis not present

## 2017-04-22 DIAGNOSIS — K56609 Unspecified intestinal obstruction, unspecified as to partial versus complete obstruction: Secondary | ICD-10-CM

## 2017-04-22 DIAGNOSIS — I5032 Chronic diastolic (congestive) heart failure: Secondary | ICD-10-CM | POA: Diagnosis not present

## 2017-04-22 DIAGNOSIS — I480 Paroxysmal atrial fibrillation: Secondary | ICD-10-CM

## 2017-04-22 NOTE — Progress Notes (Signed)
Location:   [facility] Room Number: 124/P Place of Service:  SNF [phone]) Provider:  Billey Gosling, MD  Patient Care Team: Unk Pinto, MD as PCP - General (Internal Medicine) Deneise Lever, MD as Consulting Physician (Pulmonary Disease) Lelon Perla, MD as Consulting Physician (Cardiology) Inda Castle, MD as Consulting Physician (Gastroenterology) Rolm Bookbinder, MD as Consulting Physician (Dermatology)  Extended Emergency Contact Information Primary Emergency Contact: Claxton,[name] Address: [address]          [place] [address] [name] of [place] Phone: [phone] Mobile Phone: [phone] Relation: Spouse Secondary Emergency Contact: Charlene Brooke,  [place] Phone: [phone] Mobile Phone: [phone] Relation: Daughter  Code Status:  Full Code Goals of care: Advanced Directive information Advanced Directives 04/22/2017  Does Patient Have a Medical Advance Directive? Yes  Type of Advance Directive (No Data)  Does patient want to make changes to medical advance directive? No - Patient declined  Copy of [place] in Chart? -  Would patient like information on creating a medical advance directive? No - Patient declined  Pre-existing out of facility DNR order (yellow form or pink MOST form) -     Chief Complaint  Patient presents with  . Acute Visit    Wt. Gain    HPI:   Pt is a 83 y.o. female seen today for an acute visit Weight gain.  Patient has a history of numerous medical issues including COPD on chronic oxygen hypertension hypothyroidism mild aortic insufficiency anxiety vitamin D deficiency and peripheral arterial disease.  She was hospitalized recently for a small bowel obstruction that was managed conservatively with IV fluids and an NG tube.  She also had rapid atrial fibrillation and was started on Eliquis--as well as  Cardizem she did convert spontaneously to normal sinus rhythm.  She was also treated for aspiration pneumonia with Dara Lords also went CHF this was thought secondary to the atrial fibrillation-talking with patient she has been on Lasix at times at home on a when necessary basis when she had increased edema-we've been monitoring her weights here and appears she's had some trending up with increased edema her weights are variable appears on admission she was 97 pounds and she is 102 pounds today.  Although there's been some variation over the past week-at one point she was 100.2 back on July 21.  However she has had some increased edema she has TED hose on-is not complaining of any shortness of breath or discomfort or chest pain.  She states usually at home she did 2-3 days of Lasix when this occurs.       Past Medical History:  Diagnosis Date  . Aortic regurgitation   . Bronchiectasis ([place])   . COPD (chronic obstructive pulmonary disease) ([place])   . DJD (degenerative joint disease)   . HLD (hyperlipidemia)   . HTN (hypertension)   . Mild mitral regurgitation by prior echocardiogram   . Mild tricuspid regurgitation   . Palpitations    a. has known history of PAC's and PVC's. b. 09/2015: monitor showed sinus rhythm with occasional PAC's and a brief episode of PAT.  [name] Thyroid disease    Past Surgical History:  Procedure Laterality Date  . ABDOMINAL HYSTERECTOMY  1993  . CATARACT EXTRACTION, BILATERAL      Allergies  Allergen Reactions  . Vancomycin     Worsening kidney function  .  Sulfa Antibiotics Palpitations    Unknown    Outpatient Encounter Prescriptions as of 04/22/2017  Medication Sig  . ALPRAZolam (XANAX) 0.25 MG tablet Take 0.25 mg by mouth 2 (two) times daily as needed for anxiety. Give for 14 days until 04/26/2017  . apixaban (ELIQUIS) 2.5 MG TABS tablet Take 1 tablet (2.5 mg total) by mouth 2 (two) times daily.  . Ascorbic Acid (VITAMIN C) 500 MG tablet Take 500  mg by mouth daily.    . bisacodyl (DULCOLAX) 10 MG suppository Place 1 suppository (10 mg total) rectally every morning.  . cholecalciferol (VITAMIN D) 1000 UNITS tablet Take 1,000 Units by mouth daily.   [name] diltiazem (CARDIZEM) 90 MG tablet Take 1 tablet (90 mg total) by mouth every 6 (six) hours.  . feeding supplement, ENSURE ENLIVE, (ENSURE ENLIVE) LIQD Take 237 mLs by mouth 2 (two) times daily between meals.  [name] ipratropium-albuterol (DUONEB) 0.5-2.5 (3) MG/3ML SOLN Take 3 mLs by nebulization every 6 (six) hours as needed.  [name] levothyroxine (SYNTHROID, LEVOTHROID) 50 MCG tablet TAKE 1 TABLET BY MOUTH DAILY OR AS DIRECTED  . metoprolol tartrate 37.5 MG TABS Take 37.5 mg by mouth 2 (two) times daily.  . mirtazapine (REMERON) 7.5 MG tablet Take 1 tablet (7.5 mg total) by mouth at bedtime.  [name] omeprazole (PRILOSEC) 40 MG capsule TAKE ONE (1) CAPSULE EACH DAY  . senna-docusate (SENOKOT-S) 8.6-50 MG tablet Take 1 tablet by mouth at bedtime.  . simethicone (MYLICON) 80 MG chewable tablet Chew 1 tablet (80 mg total) by mouth 2 (two) times daily.   No facility-administered encounter medications on file as of 04/22/2017.     Review of Systems   General she does not complaining of any fever or chills has gained some weight.  Skin does not complain of rashes or itching.  Head ears eyes nose mouth and throat denies visual changes or sore throat.  Her story has a history of COPD but is not really complaining of any increased shortness of breath-does not really complain of cough.  Cardiac does not complain of chest pain has some increased lower extremity edema  GI does not complain of any abdominal discomfort says she is having regular bowel movements does not complain of nausea or vomiting.  Muscle skeletal is not complaining of joint pain appears to have gained some strength.  Neurologic does not complain of dizziness headache or syncope.  Psych continues to be very pleasant does not complain of  depression or anxiety appears to be adapting well to the facility  Immunization History  Administered Date(s) Administered  . Influenza Split 07/16/2013  . Influenza, High Dose Seasonal PF 06/25/2015, 06/10/2016  . Influenza,inj,Quad PF,36+ Mos 07/04/2014  . Pneumococcal Conjugate-13 02/26/2014  . Pneumococcal Polysaccharide-23 10/04/2016  . Td 06/26/2013   Pertinent  Health Maintenance Due  Topic Date Due  . INFLUENZA VACCINE  04/27/2017  . DEXA SCAN  Completed  . PNA vac Low Risk Adult  Completed   Fall Risk  01/24/2017 09/09/2016 04/07/2016 12/30/2015 09/25/2015  Falls in the past year? _0    Functional Status Survey:    Vitals:   04/22/17 1446  BP: (!) 125/59  Pulse: 60  Resp: 20  Temp: 98 F (36.7 C)  TempSrc: Oral  Weight is 102 pounds  Physical Exam   In general this continues to be a very pleasant somewhat frail elderly female in no distress sitting comfortably on the side of her bed.  Her skin is warm  and dry.  Eyes visual acuity appears grossly intact.  Oropharynx is clear mucous membranes moist.  Chest continues with very shallow air entry but there is no labored breathing or overt congestion.  Heart continues to have distant heart sounds with regular rate and rhythm she has I would say trace to 1+ lower extremity edema this is increased from initial exam she has slightly more edema on the left versus right leg she says this is not new and chronic.--She has TED hose applied pedal pulses are palpable but reduced bilaterally.  Her abdomen is somewhat protuberant soft nontender with positive bowel sounds.  Muscle skeletal moves all extremities 4 with some weakness of her right arm with a history of arthritis is not new   Neurologic is grossly intact her speech is clear no lateralizing findings.  Psych she is alert and oriented pleasant and appropriate    Labs reviewed:  Recent Labs  04/24/16 0615 04/25/16 0602  04/02/17 0605   04/04/17 0502 04/05/17 0359  04/10/17 0144 04/14/17 0615 04/18/17 0600  NA 134* 138  < > 145  < > 147* 145  < > 146* 138 139  K 4.3 4.2  < > 3.3*  < > 3.0* 3.5  < > 4.2 3.8 3.7  CL 99* 101  < > 105  < > 98* 98*  < > 99* 98* 99*  CO2 28 30  < > 32  < > 40* 40*  < > 37* 33* 32  GLUCOSE 121* 108*  < > 77  < > 151* 123*  < > 112* 98 88  BUN 72* 64*  < > 25*  < > 24* 25*  < > 21* 18 21*  CREATININE 2.13* 1.63*  < > 0.81  < > 0.93 0.98  < > 0.92 1.07* 1.09*  CALCIUM 8.3* 8.7*  < > 8.4*  < > 8.4* 8.9  < > 8.7* 8.4* 8.8*  MG  --   --   < > 1.7  --  1.4* 1.9  --   --   --   --   PHOS 3.6 3.7  --   --   --   --   --   --   --   --   --   < > = values in this interval not displayed.  Recent Labs  03/30/17 1850 03/31/17 0630 04/18/17 0600  AST _0 ALT 12* 9* 12*  ALKPHOS 75 59 58  BILITOT 1.0 1.0 0.4  PROT 8.3* 6.4* 5.8*  ALBUMIN 4.2 3.3* 2.5*    Recent Labs  01/24/17 1552  04/05/17 0359 04/06/17 0448 04/14/17 0615 04/18/17 0600  WBC 6.4  < > 10.4 8.5 9.3 8.1  NEUTROABS 3,904  --  8.8*  --  7.1  --   HGB 12.2  < > 12.3 12.4 10.7* 10.5*  HCT 38.4  < > 39.7 40.7 33.4* 33.6*  MCV 92.5  < > 97.1 99.3 94.6 93.9  PLT 217  < > 190 218 259 361  < > = values in this interval not displayed. Lab Results  Component Value Date   TSH 0.936 04/04/2017   Lab Results  Component Value Date   HGBA1C 5.4 01/24/2017   Lab Results  Component Value Date   CHOL 224 (H) 01/24/2017   HDL 106 01/24/2017   LDLCALC 104 (H) 01/24/2017   TRIG 72 01/24/2017   CHOLHDL 2.1 01/24/2017    Significant Diagnostic Results in last 30  days:  Dg Abd 1 View  Result Date: 04/01/2017 CLINICAL DATA:  NG tube position. EXAM: ABDOMEN - 1 VIEW COMPARISON:  04/01/2017 FINDINGS: NG tube tip is in the mid stomach with the side port in the proximal stomach. IMPRESSION: NG tube tip in the mid stomach. Electronically Signed   By: Rolm Baptise M.D.   On: 04/01/2017 11:49   Ct Abdomen Pelvis W Contrast  Result  Date: 04/08/2017 CLINICAL DATA:  Abdominal pain with nausea and vomiting. Small bowel obstruction. EXAM: CT ABDOMEN AND PELVIS WITH CONTRAST TECHNIQUE: Multidetector CT imaging of the abdomen and pelvis was performed using the standard protocol following bolus administration of intravenous contrast. CONTRAST:  23m ISOVUE-300 IOPAMIDOL (ISOVUE-300) INJECTION 61% COMPARISON:  03/30/2017 FINDINGS: Lower Chest: New small pleural effusions, left side greater than right. New left lower lobe atelectasis versus infiltrate. Mild compressive atelectasis in right lung base. Hepatobiliary: 1 cm homogeneous hypervascular lesion in the anterior left hepatic lobe shows no significant change since recent study. This most likely represents a tiny flash-filling hemangioma. Tiny sub-cm cyst again seen in the posterior left hepatic lobe. Gallbladder is unremarkable. Pancreas:  No mass or inflammatory changes. Spleen: Within normal limits in size and appearance. Adrenals/Urinary Tract: No masses identified. Tiny bilateral renal cysts again noted. No evidence of hydronephrosis. Stomach/Bowel: There has been near complete resolution of dilated bowel loops since prior study. Diverticulosis again seen involving the descending and sigmoid colon, however there is no evidence of diverticulitis. Vascular/Lymphatic: No pathologically enlarged lymph nodes. No abdominal aortic aneurysm. Aortic atherosclerosis. Reproductive: Prior hysterectomy noted. Adnexal regions are unremarkable in appearance. Other:  None. Musculoskeletal: No suspicious bone lesions identified. Advanced lumbar spondylosis and moderate dextroscoliosis again noted. IMPRESSION: Near complete resolution of dilated small bowel loops since previous study. Colonic diverticulosis, without radiographic evidence of diverticulitis. New small bilateral pleural effusions, left side greater than right, and left lower lobe atelectasis versus infiltrate. Aortic atherosclerosis.  Electronically Signed   By: JEarle GellM.D.   On: 04/08/2017 09:40   Ct Abdomen Pelvis W Contrast  Result Date: 03/30/2017 CLINICAL DATA:  Abdominal pain with bloating.  Nausea and vomiting. EXAM: CT ABDOMEN AND PELVIS WITH CONTRAST TECHNIQUE: Multidetector CT imaging of the abdomen and pelvis was performed using the standard protocol following bolus administration of intravenous contrast. CONTRAST:  376mISOVUE-300 IOPAMIDOL (ISOVUE-300) INJECTION 61%, 10012mSOVUE-300 IOPAMIDOL (ISOVUE-300) INJECTION 61% COMPARISON:  08/15/2007 FINDINGS: Lower chest: Tubular branching structures posterior lung bases suggest impacted airways but difficult to assess given the substantial breathing motion artifact. Hepatobiliary: 7 mm hypervascular lesion subcapsular left liver cannot be further characterized. Mild intrahepatic biliary duct prominence. Gallbladder unremarkable. Extrahepatic common bile duct measures 8 mm diameter, increased in the interval. Pancreas: No focal mass lesion. No dilatation of the main duct. No intraparenchymal cyst. No peripancreatic edema. Spleen: No splenomegaly. No focal mass lesion. Adrenals/Urinary Tract: No adrenal nodule or mass. Cortical scarring noted both kidneys with small renal cysts noted bilaterally. No evidence for hydroureter. Bladder is distended. Stomach/Bowel: Stomach is mildly distended. Duodenum unremarkable. Small bowel loops in the lower abdomen and pelvis are fluid-filled and dilated. A prominently dilated loop in the mid pelvis measures up to 4.6 cm diameter. This dilated loop pin tracts down to the pelvis where a discrete transition point is identified in the right pelvis (see image 53 series 2 and also visible on sagittal image 52 of series 6). I cannot identify dilated small bowel beyond this transition zone to suggest a closed loop etiology. The  terminal ileum is collapsed. The appendix is not visualized, but there is no edema or inflammation in the region of the cecum.  Diverticuli are seen scattered along the entire length of the colon without CT findings of diverticulitis. Vascular/Lymphatic: There is abdominal aortic atherosclerosis without aneurysm. There is no gastrohepatic or hepatoduodenal ligament lymphadenopathy. No intraperitoneal or retroperitoneal lymphadenopathy. No pelvic sidewall lymphadenopathy. Reproductive: Uterus is surgically absent. There is no adnexal mass. Other: Free fluid is identified around the liver and spleen. Interloop mesenteric fluid is identified in the central small bowel mesentery. Musculoskeletal: Bone windows reveal no worrisome lytic or sclerotic osseous lesions. IMPRESSION: 1. Mechanical small bowel obstruction with small bowel loops dilated up to 4.6 cm diameter and a definite transition zone identified in the right pelvis. Although there is some question of twisting in the region of the transition zone, there is no second transition zone identified nor dilated small bowel after the transition zone to suggest a closed loop obstruction. As such, imaging features likely related to an adhesion given the history of hysterectomy. There is fluid around the liver and spleen with interloop mesenteric fluid associated with the abnormal small bowel loops. No small bowel wall thickening or pneumatosis at this time to suggest overt bowel ischemia. 2. Probable airway impaction in both posterior lower lobes although not well assessed given motion artifact. Electronically Signed   By: Misty Stanley M.D.   On: 03/30/2017 21:51   Dg Chest Port 1 View  Result Date: 04/07/2017 CLINICAL DATA:  Vomiting with concern for aspiration pneumonitis EXAM: PORTABLE CHEST 1 VIEW COMPARISON:  April 04, 2017 FINDINGS: There is persistent interstitial pulmonary edema. There is consolidation in the left lower lobe with left pleural effusion stable. There is less airspace opacity in the right base compared to recent study. Small right pleural effusion is present. Heart is  mildly enlarged with pulmonary venous hypertension. There is aortic atherosclerosis. No new opacity evident. No adenopathy. There is advanced arthropathy in the right shoulder, stable. IMPRESSION: Slightly less opacity in the right base, likely slightly less pulmonary edema. Changes of congestive heart failure overall persist. Question superimposed pneumonia left lower lobe given the degree of consolidation. Stable left pleural effusion. There is aortic atherosclerosis. No evident. No new opacity evident. Advanced arthropathy right shoulder. Aortic Atherosclerosis (ICD10-I70.0). Electronically Signed   By: Lowella Grip III M.D.   On: 04/07/2017 08:25   Dg Chest Port 1 View  Result Date: 04/04/2017 CLINICAL DATA:  Acute on chronic diastolic heart failure. EXAM: PORTABLE CHEST 1 VIEW COMPARISON:  Radiograph 04/02/2017, 03/30/2017 FINDINGS: Stable heart size and mediastinal contours. Stable hazy opacity at both lung bases likely comminution of pleural fluid and atelectasis. Pulmonary edema with slight improvement from prior exam. Biapical pleuroparenchymal scarring again seen. No pneumothorax or new confluent airspace disease. Chronic change in the right shoulder. IMPRESSION: Slight improvement in congestive heart failure with mild decrease in pulmonary edema. Bilateral pleural effusions are unchanged. Electronically Signed   By: Jeb Levering M.D.   On: 04/04/2017 16:01   Dg Chest Port 1 View  Result Date: 04/02/2017 CLINICAL DATA:  Cough. EXAM: PORTABLE CHEST 1 VIEW COMPARISON:  03/30/2017 FINDINGS: 2 frontal radiographs. Nasogastric tube extends beyond the inferior aspect of the film. Patient rotated to the right. Midline trachea. Normal heart size. Layering bilateral pleural effusions are new or significantly increased. Greater on the right. No pneumothorax. progression of moderate interstitial edema. Development of bibasilar airspace disease. IMPRESSION: Worsened aeration, with new or increased  moderate congestive  heart failure. Development of bilateral pleural effusions and adjacent airspace disease, most likely atelectasis. Infection or aspiration cannot be excluded. Electronically Signed   By: Abigail Miyamoto M.D.   On: 04/02/2017 11:14   Dg Chest Portable 1 View  Result Date: 03/30/2017 CLINICAL DATA:  Nasogastric tube placement. Generalized abdominal pain and bloating, acute onset. Initial encounter. EXAM: PORTABLE CHEST 1 VIEW COMPARISON:  Chest radiograph performed 06/10/2016 FINDINGS: The patient's enteric tube is seen extending below the diaphragm. The lungs are hyperexpanded, with biapical scarring. Vascular congestion is noted, with increased interstitial markings. Mild interstitial edema cannot be excluded. No pleural effusion or pneumothorax is seen. The cardiomediastinal silhouette is borderline normal in size. No acute osseous abnormalities are seen. Mild degenerative change is noted at the glenohumeral joints bilaterally. IMPRESSION: 1. Enteric tube noted extending below the diaphragm. 2. Lungs hyperexpanded, with biapical scarring, likely reflecting COPD. Underlying vascular congestion, with increased interstitial markings. Mild interstitial edema cannot be excluded. Electronically Signed   By: Garald Balding M.D.   On: 03/30/2017 23:55   Dg Abd 2 Views  Result Date: 04/05/2017 CLINICAL DATA:  Vomiting with nausea EXAM: ABDOMEN - 2 VIEW COMPARISON:  04/03/2017 FINDINGS: Small bilateral effusions with dense left lower lobe consolidation and patchy infiltrate at the right base. No definite free air beneath the diaphragm. Some contrast present in the colon. Contrast opacifies dilated small bowel in the lower abdomen and pelvis, measuring up to 4 cm. IMPRESSION: Dilated loops of small bowel containing contrast within the lower abdomen and pelvis consistent with bowel obstruction. There is a small amount of contrast present in the colon. Small bilateral effusions and left greater than right  bibasilar infiltrates Electronically Signed   By: Donavan Foil M.D.   On: 04/05/2017 21:07   Dg Abd 2 Views  Result Date: 04/03/2017 CLINICAL DATA:  Patient with history of small bowel obstruction. EXAM: ABDOMEN - 2 VIEW COMPARISON:  Abdominal radiograph 04/01/2017 FINDINGS: Enteric tube tip and side-port project over the stomach. Oral contrast material within the colon. Stable gaseous distended loops of small bowel within the central abdomen. Heterogeneous opacities lung bases bilaterally. Small bilateral pleural effusions. IMPRESSION: Enteric tube tip and side-port project over the stomach. Unchanged gaseous distended loops of bowel within the central abdomen. Layering effusions and underlying opacities. Electronically Signed   By: Lovey Newcomer M.D.   On: 04/03/2017 09:13   Dg Abd 2 Views  Result Date: 04/01/2017 CLINICAL DATA:  Followup small bowel obstruction. EXAM: ABDOMEN - 2 VIEW COMPARISON:  03/31/2017 FINDINGS: There are persistent loops of dilated central small bowel with air-fluid levels consistent with a partial small bowel obstruction, similar to the prior study. There is no free air. Nasogastric tube extends below the diaphragm into the mid stomach. IMPRESSION: 1. Persistent partial small bowel obstruction. No significant change from the prior study. No free air. Electronically Signed   By: Lajean Manes M.D.   On: 04/01/2017 08:42   Dg Abd 2 Views  Result Date: 03/31/2017 CLINICAL DATA:  Follow-up small bowel obstruction. EXAM: ABDOMEN - 2 VIEW COMPARISON:  03/30/2017 abdominal CT FINDINGS: An NG tube is identified with tip overlying the proximal-mid stomach. Dilated small bowel loops are again noted and not significantly changed. There is no evidence of pneumoperitoneum. No other changes identified. IMPRESSION: Little significant change of dilated small bowel loops compatible with small bowel obstruction. No evidence of pneumoperitoneum. NG tube with tip overlying the proximal-mid stomach.  Electronically Signed   By: Cleatis Polka.D.  On: 03/31/2017 21:12   Dg Swallowing Func-speech Pathology  Result Date: 04/05/2017 Objective Swallowing Evaluation: Type of Study: MBS-Modified Barium Swallow Study Patient Details Name: Andrea [name] MRN: [id number] Date of Birth: [birth year] Today's Date: 04/05/2017 Time: SLP Start Time (ACUTE ONLY): 0805-SLP Stop Time (ACUTE ONLY): 0819 SLP Time Calculation (min) (ACUTE ONLY): 14 min Past Medical History: Past Medical History: Diagnosis Date . Aortic regurgitation  . Bronchiectasis ([place])  . COPD (chronic obstructive pulmonary disease) ([place])  . DJD (degenerative joint disease)  . HLD (hyperlipidemia)  . HTN (hypertension)  . Mild mitral regurgitation by prior echocardiogram  . Mild tricuspid regurgitation  . Palpitations   a. has known history of PAC's and PVC's. b. 09/2015: monitor showed sinus rhythm with occasional PAC's and a brief episode of PAT. [name] Thyroid disease  Past Surgical History: Past Surgical History: Procedure Laterality Date . ABDOMINAL HYSTERECTOMY  1993 . CATARACT EXTRACTION, BILATERAL   HPI: 83 year old female with a history of bronchiectasis/COPD, chronic respiratory failure on 2 L at night, hypertension, hyperlipidemia, diastolic CHF, hypothyroidism, and PATpresented with a one to two-day history of abdominal pain with associated nausea and vomiting. The patient denied any fevers, chills, chest pain, shortness breath, diarrhea, hematochezia, melena. CT of the abdomen and pelvis at the time of admission showed dilated small bowel loops up to 4.6 cm in the mid pelvis with a transition zone in the right pelvis. NG tube was inserted for decompression and Gen. surgery was consulted to assist with management. Overall, her bowel function has improved, and her NG tube was discontinued 7/8. Her diet was advanced which she tolerated. Her hospitalization has been Located by atrial tachycardia/atrial fibrillation, acute on chronic diastolic CHF, and  aspiration pneumonia. Pt and family report continued swallowing difficulty. ST will evaluate and treat as indicated. No Data Recorded Assessment / Plan / Recommendation CHL IP CLINICAL IMPRESSIONS 04/05/2017 Clinical Impression Pt presents with an inconsistent mild to moderate oropharyngeal dysphagia. Oral phase characterized by decreased bolus cohesion of solids and delayed oral transit. Swallow initiation delayed throughout PO trials, to the level of the valleculae with puree and solids, and to the level of the pyriform sinuses with thin and nectar thick liquids. Prominent vallecular residuals evidenced secondary to reduced tongue base retraction, which were increased with solids and thickened consistencies. Thin liquid alternation aided in clearing vallecular residuals.  Inconsistent penetration occured with thin liquids before the swallow that intermittently reached the level of the vocal cords and was cleared out of laryngeal vestibule intermittently. Volitional throat clear aided in expelling penetrates with cueing from SLP. Nectar thick liquids without penetration. As PO trials progressed pt did evidence cup and straw sips of thin liquids without penetration. Barium tablet administered with thin liquids. Pt displayed difficulty propelling tablet throughout pharynx, initially sticking in the valleculae, then the pyriform sinuses, ultimately requiring nectar thick liquids to clear through pharynx. As pt was struggling to pass pill throughout pharynx, silent aspiration of thin liquids occured after the swallow. Recommend chopped consistencies with thin liquids in isolation. Pt advised to avoid mixed consistencies as airway protection appears compromised when two consistencies administered at the same time. Recommend medicines whole in puree (crush larger meds as needed). ST to follow up for diet tolerance and education with family for safe swallow strategies  SLP Visit Diagnosis Dysphagia, oropharyngeal phase  (R13.12) Attention and concentration deficit following -- Frontal lobe and executive function deficit following -- Impact on safety and function Moderate aspiration risk   [facility] IP TREATMENT RECOMMENDATION  04/05/2017 Treatment Recommendations Therapy as outlined in treatment plan below   Prognosis 04/05/2017 Prognosis for Safe Diet Advancement Good Barriers to Reach Goals -- Barriers/Prognosis Comment -- [facility] IP DIET RECOMMENDATION 04/05/2017 SLP Diet Recommendations Dysphagia 2 (Fine chop) solids;Thin liquid;No mixed consistencies Liquid Administration via -- Medication Administration Whole meds with puree Compensations Minimize environmental distractions;Slow rate;Small sips/bites;Multiple dry swallows after each bite/sip;Follow solids with liquid;Clear throat intermittently Postural Changes Remain semi-upright after after feeds/meals (Comment);Seated upright at 90 degrees   [facility] IP OTHER RECOMMENDATIONS 04/05/2017 Recommended Consults -- Oral Care Recommendations Oral care BID Other Recommendations --   [facility] IP FOLLOW UP RECOMMENDATIONS 04/05/2017 Follow up Recommendations 24 hour supervision/assistance   [facility] IP FREQUENCY AND DURATION 04/05/2017 Speech Therapy Frequency (ACUTE ONLY) min 2x/week Treatment Duration 2 weeks      [facility] IP ORAL PHASE 04/05/2017 Oral Phase Impaired Oral - Pudding Teaspoon -- Oral - Pudding Cup -- Oral - Honey Teaspoon -- Oral - Honey Cup -- Oral - Nectar Teaspoon Delayed oral transit;Lingual/palatal residue Oral - Nectar Cup Delayed oral transit;Lingual/palatal residue;Weak lingual manipulation Oral - Nectar Straw Weak lingual manipulation;Delayed oral transit;Lingual/palatal residue Oral - Thin Teaspoon Weak lingual manipulation;Delayed oral transit;Lingual/palatal residue Oral - Thin Cup Weak lingual manipulation;Delayed oral transit;Lingual/palatal residue Oral - Thin Straw Delayed oral transit;Weak lingual manipulation;Lingual/palatal residue Oral - Puree Delayed oral transit;Weak lingual  manipulation;Lingual pumping Oral - Mech Soft -- Oral - Regular Lingual/palatal residue;Piecemeal swallowing;Delayed oral transit;Decreased bolus cohesion;Weak lingual manipulation Oral - Multi-Consistency -- Oral - Pill Lingual pumping;Reduced posterior propulsion;Delayed oral transit;Piecemeal swallowing;Lingual/palatal residue Oral Phase - Comment --  [facility] IP PHARYNGEAL PHASE 04/05/2017 Pharyngeal Phase Impaired Pharyngeal- Pudding Teaspoon -- Pharyngeal -- Pharyngeal- Pudding Cup -- Pharyngeal -- Pharyngeal- Honey Teaspoon -- Pharyngeal -- Pharyngeal- Honey Cup -- Pharyngeal -- Pharyngeal- Nectar Teaspoon Reduced tongue base retraction;Delayed swallow initiation-vallecula;Delayed swallow initiation-pyriform sinuses;Pharyngeal residue - valleculae Pharyngeal -- Pharyngeal- Nectar Cup Delayed swallow initiation-vallecula;Delayed swallow initiation-pyriform sinuses;Reduced tongue base retraction;Pharyngeal residue - valleculae Pharyngeal -- Pharyngeal- Nectar Straw -- Pharyngeal -- Pharyngeal- Thin Teaspoon Delayed swallow initiation-vallecula;Penetration/Aspiration during swallow;Penetration/Aspiration before swallow;Pharyngeal residue - valleculae;Reduced tongue base retraction;Reduced epiglottic inversion Pharyngeal Material enters airway, CONTACTS cords and then ejected out Pharyngeal- Thin Cup Delayed swallow initiation-vallecula;Reduced tongue base retraction;Pharyngeal residue - valleculae;Penetration/Aspiration during swallow;Penetration/Aspiration before swallow Pharyngeal Material enters airway, CONTACTS cords and not ejected out Pharyngeal- Thin Straw Penetration/Aspiration during swallow;Penetration/Aspiration before swallow;Delayed swallow initiation-vallecula;Reduced tongue base retraction;Pharyngeal residue - valleculae Pharyngeal Material enters airway, remains ABOVE vocal cords and not ejected out Pharyngeal- Puree Delayed swallow initiation-vallecula;Pharyngeal residue - valleculae;Reduced tongue  base retraction Pharyngeal -- Pharyngeal- Mechanical Soft -- Pharyngeal -- Pharyngeal- Regular Delayed swallow initiation-vallecula;Pharyngeal residue - valleculae;Reduced tongue base retraction Pharyngeal -- Pharyngeal- Multi-consistency -- Pharyngeal -- Pharyngeal- Pill Delayed swallow initiation-vallecula;Other (Comment);Penetration/Apiration after swallow Pharyngeal Material enters airway, passes BELOW cords and not ejected out despite cough attempt by patient Pharyngeal Comment --  No flowsheet data found. Arvil Chaco MA, CCC-SLP Acute Care Speech Language Pathologist  Levi Aland 04/05/2017, 9:48 AM               Assessment/Plan  1 history of increased edema-weight gain with history of diastolic CHF-she does not appear to be clinically symptomatic however she has had it appears some mild weight gain as well as edema-she has been on Lasix before at home will give her 2 days of Lasix 20 mg a day as well as potassium 10 mEq a day and update a BMP as well as a BN P on Monday, July  30 I note she continues on a beta blocker   2 atrial fibrillation this appears rate controlled she is on diltiazem as well as Lopressor continues on Eliquis for anticoagulation.--Will update a CBC as well on Monday, July 30 90 on her hemoglobin which has been stable at 10.5 on lab done earlier this week  #3 history small bowel obstruction she appears to be making an unremarkable recovery again she was managed conservatively in the hospital.   [phone]

## 2017-04-25 ENCOUNTER — Encounter (HOSPITAL_COMMUNITY)
Admission: RE | Admit: 2017-04-25 | Discharge: 2017-04-25 | Disposition: A | Discharge status: Home / Self Care | Payer: Medicare Other | Source: Skilled Nursing Facility | Attending: *Deleted | Admitting: *Deleted

## 2017-04-25 LAB — CBC WITH DIFFERENTIAL/PLATELET
BASOS PCT: 0 %
Basophils Absolute: 0 10*3/uL (ref 0.0–0.1)
EOS ABS: 0.1 10*3/uL (ref 0.0–0.7)
Eosinophils Relative: 2 %
HEMATOCRIT: 32.9 % — AB (ref 36.0–46.0)
HEMOGLOBIN: 10.3 g/dL — AB (ref 12.0–15.0)
LYMPHS ABS: 1.6 10*3/uL (ref 0.7–4.0)
Lymphocytes Relative: 22 %
MCH: 30.2 pg (ref 26.0–34.0)
MCHC: 31.3 g/dL (ref 30.0–36.0)
MCV: 96.5 fL (ref 78.0–100.0)
MONOS PCT: 10 %
Monocytes Absolute: 0.8 10*3/uL (ref 0.1–1.0)
NEUTROS ABS: 4.7 10*3/uL (ref 1.7–7.7)
NEUTROS PCT: 66 %
Platelets: 290 10*3/uL (ref 150–400)
RBC: 3.41 MIL/uL — AB (ref 3.87–5.11)
RDW: 13.9 % (ref 11.5–15.5)
WBC: 7.2 10*3/uL (ref 4.0–10.5)

## 2017-04-25 LAB — BASIC METABOLIC PANEL
Anion gap: 7 (ref 5–15)
BUN: 22 mg/dL — ABNORMAL HIGH (ref 6–20)
CHLORIDE: 98 mmol/L — AB (ref 101–111)
CO2: 35 mmol/L — AB (ref 22–32)
Calcium: 8.4 mg/dL — ABNORMAL LOW (ref 8.9–10.3)
Creatinine, Ser: 1.05 mg/dL — ABNORMAL HIGH (ref 0.44–1.00)
GFR calc non Af Amer: 47 mL/min — ABNORMAL LOW (ref >60)
GFR, EST AFRICAN AMERICAN: 54 mL/min — AB (ref >60)
Glucose, Bld: 92 mg/dL (ref 65–99)
POTASSIUM: 3.8 mmol/L (ref 3.5–5.1)
SODIUM: 140 mmol/L (ref 135–145)

## 2017-04-25 LAB — BRAIN NATRIURETIC PEPTIDE: B Natriuretic Peptide: 329 pg/mL — ABNORMAL HIGH (ref 0.0–100.0)

## 2017-04-27 ENCOUNTER — Encounter: Payer: Self-pay | Admitting: Internal Medicine

## 2017-04-27 ENCOUNTER — Non-Acute Institutional Stay (SKILLED_NURSING_FACILITY): Payer: Medicare Other | Admitting: Internal Medicine

## 2017-04-27 DIAGNOSIS — I5032 Chronic diastolic (congestive) heart failure: Secondary | ICD-10-CM

## 2017-04-27 DIAGNOSIS — H1852 Epithelial (juvenile) corneal dystrophy: Secondary | ICD-10-CM

## 2017-04-27 DIAGNOSIS — H18599 Other hereditary corneal dystrophies, unspecified eye: Secondary | ICD-10-CM | POA: Insufficient documentation

## 2017-04-27 DIAGNOSIS — K56609 Unspecified intestinal obstruction, unspecified as to partial versus complete obstruction: Secondary | ICD-10-CM

## 2017-04-27 DIAGNOSIS — I480 Paroxysmal atrial fibrillation: Secondary | ICD-10-CM | POA: Diagnosis not present

## 2017-04-27 DIAGNOSIS — F419 Anxiety disorder, unspecified: Secondary | ICD-10-CM | POA: Diagnosis not present

## 2017-04-27 DIAGNOSIS — H18529 Epithelial (juvenile) corneal dystrophy, unspecified eye: Secondary | ICD-10-CM | POA: Insufficient documentation

## 2017-04-27 DIAGNOSIS — H1859 Other hereditary corneal dystrophies: Secondary | ICD-10-CM

## 2017-04-27 NOTE — Progress Notes (Signed)
Location:   [facility] Room Number: 124/P Place of Service:  SNF [phone]) Provider:  Billey Gosling, MD  Patient Care Team: Unk Pinto, MD as PCP - General (Internal Medicine) Deneise Lever, MD as Consulting Physician (Pulmonary Disease) Lelon Perla, MD as Consulting Physician (Cardiology) Inda Castle, MD as Consulting Physician (Gastroenterology) Rolm Bookbinder, MD as Consulting Physician (Dermatology)  Extended Emergency Contact Information Primary Emergency Contact: Owen,[name] Address: [address]          [place] [address] [name] of [place] Phone: [phone] Mobile Phone: [phone] Relation: Spouse Secondary Emergency Contact: Charlene Brooke, [place] Phone: [phone] Mobile Phone: [phone] Relation: Daughter  Code Status: Full Code  Goals of care: Advanced Directive information Advanced Directives 04/27/2017  Does Patient Have a Medical Advance Directive? Yes  Type of Advance Directive (No Data)  Does patient want to make changes to medical advance directive? No - Patient declined  Copy of [place] in Chart? -  Would patient like information on creating a medical advance directive? No - Patient declined  Pre-existing out of facility DNR order (yellow form or pink MOST form) -     Chief complaint-acute visit to follow up edema-weight gain-anxiety  HPI:  Pt is a 83 y.o. female seen today for an acute visit for follow-up of edema with weight gain-as well as anxiety.   Patient has a history of numerous medical issues including COPD on chronic oxygen hypertension hypothyroidism mild aortic insufficiency anxiety vitamin D deficiency and peripheral arterial disease.  She was hospitalized recently for a small bowel obstruction that was managed conservatively with IV fluids and an NG tube.  She also had rapid atrial fibrillation  and was started on Eliquis--as well as Cardizem she did convert spontaneously to normal sinus rhythm.  She was also treated for aspiration pneumonia with antibiotic and did go into CHF thought secondary to her atrial fibrillation.  She has been on Lasix at times before at home when she had increased edema.  We did give her a couple doses here as well secondary to some mild weight gain.  Her weight appears to be down about 2 pounds since we last saw her but she feels the edema is still somewhat persistent and would like to get a couple more doses of Lasix.  She does not complain of any increased shortness of breath or cough.  We did do a BMP on July 30 which was 329 which is relatively baseline with what was earlier in the month  She also has been on Xanax at night as needed to help with insomnia and anxiety-she says she has been on this long-term and has tolerated well and would benefit from it.  She has been on it until yesterday 0.25 mg twice a day but secondary to limitations with a 14 day course has been discontinued        Past Medical History:  Diagnosis Date  . Aortic regurgitation   . Bronchiectasis ([place])   . COPD (chronic obstructive pulmonary disease) ([place])   . DJD (degenerative joint disease)   . HLD (hyperlipidemia)   . HTN (hypertension)   . Mild mitral regurgitation by prior echocardiogram   . Mild tricuspid regurgitation   . Palpitations    a. has known history of PAC's and PVC's. b. 09/2015: monitor showed sinus rhythm with occasional PAC's and  a brief episode of PAT.  [name] Thyroid disease    Past Surgical History:  Procedure Laterality Date  . ABDOMINAL HYSTERECTOMY  1993  . CATARACT EXTRACTION, BILATERAL      Allergies  Allergen Reactions  . Vancomycin     Worsening kidney function  . Sulfa Antibiotics Palpitations    Unknown    Outpatient Encounter Prescriptions as of 04/27/2017  Medication Sig  . apixaban (ELIQUIS) 2.5 MG TABS tablet Take 1 tablet  (2.5 mg total) by mouth 2 (two) times daily.  . Ascorbic Acid (VITAMIN C) 500 MG tablet Take 500 mg by mouth daily.    . bisacodyl (DULCOLAX) 10 MG suppository Place 1 suppository (10 mg total) rectally every morning.  . cholecalciferol (VITAMIN D) 1000 UNITS tablet Take 1,000 Units by mouth daily.   [name] diltiazem (CARDIZEM) 90 MG tablet Take 1 tablet (90 mg total) by mouth every 6 (six) hours.  . feeding supplement, ENSURE ENLIVE, (ENSURE ENLIVE) LIQD Take 237 mLs by mouth 2 (two) times daily between meals.  [name] ipratropium-albuterol (DUONEB) 0.5-2.5 (3) MG/3ML SOLN Take 3 mLs by nebulization every 6 (six) hours as needed.  [name] levothyroxine (SYNTHROID, LEVOTHROID) 50 MCG tablet TAKE 1 TABLET BY MOUTH DAILY OR AS DIRECTED  . metoprolol tartrate 37.5 MG TABS Take 37.5 mg by mouth 2 (two) times daily.  . mirtazapine (REMERON) 7.5 MG tablet Take 1 tablet (7.5 mg total) by mouth at bedtime.  [name] omeprazole (PRILOSEC) 40 MG capsule TAKE ONE (1) CAPSULE EACH DAY  . senna-docusate (SENOKOT-S) 8.6-50 MG tablet Take 1 tablet by mouth at bedtime.  . simethicone (MYLICON) 80 MG chewable tablet Chew 1 tablet (80 mg total) by mouth 2 (two) times daily.  . [DISCONTINUED] ALPRAZolam (XANAX) 0.25 MG tablet Take 0.25 mg by mouth 2 (two) times daily as needed for anxiety. Give for 14 days until 04/26/2017   No facility-administered encounter medications on file as of 04/27/2017.     Review of Systems  General she does not complaining of any fever or chills her weight appears to be stabilized  Skin does not complain of rashes or itching.  Head ears eyes nose mouth and throat denies visual changes or sore throat.  Resp- has a history of COPD but is not really complaining of any increased shortness of breath-does not really complain of cough.  Cardiac does not complain of chest pain has some somewhat persistent lower extremity edema this does not appear to be increased from previous exam  GI does not complain of  any abdominal discomfort says she is having regular bowel movements does not complain of nausea or vomiting.  Muscle skeletal is not complaining of joint pain appears to have gained some strength.  Neurologic does not complain of dizziness headache or syncope.  Psych continues to be very pleasant does not complain of depression or anxiety appears to be adapting well to the facility--says she has been y anxious at night nursing staff reports this as well    Immunization History  Administered Date(s) Administered  . Influenza Split 07/16/2013  . Influenza, High Dose Seasonal PF 06/25/2015, 06/10/2016  . Influenza,inj,Quad PF,36+ Mos 07/04/2014  . Pneumococcal Conjugate-13 02/26/2014  . Pneumococcal Polysaccharide-23 10/04/2016  . Td 06/26/2013   Pertinent  Health Maintenance Due  Topic Date Due  . INFLUENZA VACCINE  08/27/2017 (Originally 04/27/2017)  . DEXA SCAN  Completed  . PNA vac Low Risk Adult  Completed   Fall Risk  01/24/2017 09/09/2016 04/07/2016 12/30/2015 09/25/2015  Falls  in the past year? _0    Functional Status Survey:   T-98.0  pulse of 62 respirations of 18 blood pressure taken manually 140/66 O2 sat is 90% on room air  Weight is 100.8  lbs  Physical Exam   In general this continues to be a very pleasant somewhat frail elderly female in no distress lying comfortably in bed.  Her skin is warm and dry.  Eyes visual acuity appears grossly intact.  Oropharynx is clear mucous membranes moist.  Chest continues with very shallow air entry but there is no labored breathing or overt congestion.  Heart continues to have distant heart sounds with regular rate and rhythm she has I would say trace to 1+ lower extremity edema -- slightly more edema on the left versus right leg she says this is not new and chronic. This is relatively baseline with previous exam-   Her abdomen is somewhat protuberant soft nontender with positive bowel sounds.  Muscle  skeletal moves all extremities 4 with some weakness of her right arm with a history of arthritis is not new   Neurologic is grossly intact her speech is clear no lateralizing findings.  Psych she is alert and oriented pleasant and appropriate We  Labs reviewed:  Recent Labs  04/02/17 0605  04/04/17 0502 04/05/17 0359  04/14/17 0615 04/18/17 0600 04/25/17 0430  NA 145  < > 147* 145  < > 138 139 140  K 3.3*  < > 3.0* 3.5  < > 3.8 3.7 3.8  CL 105  < > 98* 98*  < > 98* 99* 98*  CO2 32  < > 40* 40*  < > 33* 32 35*  GLUCOSE 77  < > 151* 123*  < > 98 88 92  BUN 25*  < > 24* 25*  < > 18 21* 22*  CREATININE 0.81  < > 0.93 0.98  < > 1.07* 1.09* 1.05*  CALCIUM 8.4*  < > 8.4* 8.9  < > 8.4* 8.8* 8.4*  MG 1.7  --  1.4* 1.9  --   --   --   --   < > = values in this interval not displayed.  Recent Labs  03/30/17 1850 03/31/17 0630 04/18/17 0600  AST _1 ALT 12* 9* 12*  ALKPHOS 75 59 58  BILITOT 1.0 1.0 0.4  PROT 8.3* 6.4* 5.8*  ALBUMIN 4.2 3.3* 2.5*    Recent Labs  04/05/17 0359  04/14/17 0615 04/18/17 0600 04/25/17 0430  WBC 10.4  < > 9.3 8.1 7.2  NEUTROABS 8.8*  --  7.1  --  4.7  HGB 12.3  < > 10.7* 10.5* 10.3*  HCT 39.7  < > 33.4* 33.6* 32.9*  MCV 97.1  < > 94.6 93.9 96.5  PLT 190  < > 259 361 290  < > = values in this interval not displayed. Lab Results  Component Value Date   TSH 0.936 04/04/2017   Lab Results  Component Value Date   HGBA1C 5.4 01/24/2017   Lab Results  Component Value Date   CHOL 224 (H) 01/24/2017   HDL 106 01/24/2017   LDLCALC 104 (H) 01/24/2017   TRIG 72 01/24/2017   CHOLHDL 2.1 01/24/2017    Significant Diagnostic Results in last 30 days:  Dg Abd 1 View  Result Date: 04/01/2017 CLINICAL DATA:  NG tube position. EXAM: ABDOMEN - 1 VIEW COMPARISON:  04/01/2017 FINDINGS: NG tube tip is in the mid stomach with  the side port in the proximal stomach. IMPRESSION: NG tube tip in the mid stomach. Electronically Signed   By: Rolm Baptise M.D.   On: 04/01/2017 11:49   Ct Abdomen Pelvis W Contrast  Result Date: 04/08/2017 CLINICAL DATA:  Abdominal pain with nausea and vomiting. Small bowel obstruction. EXAM: CT ABDOMEN AND PELVIS WITH CONTRAST TECHNIQUE: Multidetector CT imaging of the abdomen and pelvis was performed using the standard protocol following bolus administration of intravenous contrast. CONTRAST:  51m ISOVUE-300 IOPAMIDOL (ISOVUE-300) INJECTION 61% COMPARISON:  03/30/2017 FINDINGS: Lower Chest: New small pleural effusions, left side greater than right. New left lower lobe atelectasis versus infiltrate. Mild compressive atelectasis in right lung base. Hepatobiliary: 1 cm homogeneous hypervascular lesion in the anterior left hepatic lobe shows no significant change since recent study. This most likely represents a tiny flash-filling hemangioma. Tiny sub-cm cyst again seen in the posterior left hepatic lobe. Gallbladder is unremarkable. Pancreas:  No mass or inflammatory changes. Spleen: Within normal limits in size and appearance. Adrenals/Urinary Tract: No masses identified. Tiny bilateral renal cysts again noted. No evidence of hydronephrosis. Stomach/Bowel: There has been near complete resolution of dilated bowel loops since prior study. Diverticulosis again seen involving the descending and sigmoid colon, however there is no evidence of diverticulitis. Vascular/Lymphatic: No pathologically enlarged lymph nodes. No abdominal aortic aneurysm. Aortic atherosclerosis. Reproductive: Prior hysterectomy noted. Adnexal regions are unremarkable in appearance. Other:  None. Musculoskeletal: No suspicious bone lesions identified. Advanced lumbar spondylosis and moderate dextroscoliosis again noted. IMPRESSION: Near complete resolution of dilated small bowel loops since previous study. Colonic diverticulosis, without radiographic evidence of diverticulitis. New small bilateral pleural effusions, left side greater than right, and left  lower lobe atelectasis versus infiltrate. Aortic atherosclerosis. Electronically Signed   By: JEarle GellM.D.   On: 04/08/2017 09:40   Ct Abdomen Pelvis W Contrast  Result Date: 03/30/2017 CLINICAL DATA:  Abdominal pain with bloating.  Nausea and vomiting. EXAM: CT ABDOMEN AND PELVIS WITH CONTRAST TECHNIQUE: Multidetector CT imaging of the abdomen and pelvis was performed using the standard protocol following bolus administration of intravenous contrast. CONTRAST:  31mISOVUE-300 IOPAMIDOL (ISOVUE-300) INJECTION 61%, 10023mSOVUE-300 IOPAMIDOL (ISOVUE-300) INJECTION 61% COMPARISON:  08/15/2007 FINDINGS: Lower chest: Tubular branching structures posterior lung bases suggest impacted airways but difficult to assess given the substantial breathing motion artifact. Hepatobiliary: 7 mm hypervascular lesion subcapsular left liver cannot be further characterized. Mild intrahepatic biliary duct prominence. Gallbladder unremarkable. Extrahepatic common bile duct measures 8 mm diameter, increased in the interval. Pancreas: No focal mass lesion. No dilatation of the main duct. No intraparenchymal cyst. No peripancreatic edema. Spleen: No splenomegaly. No focal mass lesion. Adrenals/Urinary Tract: No adrenal nodule or mass. Cortical scarring noted both kidneys with small renal cysts noted bilaterally. No evidence for hydroureter. Bladder is distended. Stomach/Bowel: Stomach is mildly distended. Duodenum unremarkable. Small bowel loops in the lower abdomen and pelvis are fluid-filled and dilated. A prominently dilated loop in the mid pelvis measures up to 4.6 cm diameter. This dilated loop pin tracts down to the pelvis where a discrete transition point is identified in the right pelvis (see image 53 series 2 and also visible on sagittal image 52 of series 6). I cannot identify dilated small bowel beyond this transition zone to suggest a closed loop etiology. The terminal ileum is collapsed. The appendix is not visualized,  but there is no edema or inflammation in the region of the cecum. Diverticuli are seen scattered along the entire length of the colon  without CT findings of diverticulitis. Vascular/Lymphatic: There is abdominal aortic atherosclerosis without aneurysm. There is no gastrohepatic or hepatoduodenal ligament lymphadenopathy. No intraperitoneal or retroperitoneal lymphadenopathy. No pelvic sidewall lymphadenopathy. Reproductive: Uterus is surgically absent. There is no adnexal mass. Other: Free fluid is identified around the liver and spleen. Interloop mesenteric fluid is identified in the central small bowel mesentery. Musculoskeletal: Bone windows reveal no worrisome lytic or sclerotic osseous lesions. IMPRESSION: 1. Mechanical small bowel obstruction with small bowel loops dilated up to 4.6 cm diameter and a definite transition zone identified in the right pelvis. Although there is some question of twisting in the region of the transition zone, there is no second transition zone identified nor dilated small bowel after the transition zone to suggest a closed loop obstruction. As such, imaging features likely related to an adhesion given the history of hysterectomy. There is fluid around the liver and spleen with interloop mesenteric fluid associated with the abnormal small bowel loops. No small bowel wall thickening or pneumatosis at this time to suggest overt bowel ischemia. 2. Probable airway impaction in both posterior lower lobes although not well assessed given motion artifact. Electronically Signed   By: Misty Stanley M.D.   On: 03/30/2017 21:51   Dg Chest Port 1 View  Result Date: 04/07/2017 CLINICAL DATA:  Vomiting with concern for aspiration pneumonitis EXAM: PORTABLE CHEST 1 VIEW COMPARISON:  April 04, 2017 FINDINGS: There is persistent interstitial pulmonary edema. There is consolidation in the left lower lobe with left pleural effusion stable. There is less airspace opacity in the right base compared to  recent study. Small right pleural effusion is present. Heart is mildly enlarged with pulmonary venous hypertension. There is aortic atherosclerosis. No new opacity evident. No adenopathy. There is advanced arthropathy in the right shoulder, stable. IMPRESSION: Slightly less opacity in the right base, likely slightly less pulmonary edema. Changes of congestive heart failure overall persist. Question superimposed pneumonia left lower lobe given the degree of consolidation. Stable left pleural effusion. There is aortic atherosclerosis. No evident. No new opacity evident. Advanced arthropathy right shoulder. Aortic Atherosclerosis (ICD10-I70.0). Electronically Signed   By: Lowella Grip III M.D.   On: 04/07/2017 08:25   Dg Chest Port 1 View  Result Date: 04/04/2017 CLINICAL DATA:  Acute on chronic diastolic heart failure. EXAM: PORTABLE CHEST 1 VIEW COMPARISON:  Radiograph 04/02/2017, 03/30/2017 FINDINGS: Stable heart size and mediastinal contours. Stable hazy opacity at both lung bases likely comminution of pleural fluid and atelectasis. Pulmonary edema with slight improvement from prior exam. Biapical pleuroparenchymal scarring again seen. No pneumothorax or new confluent airspace disease. Chronic change in the right shoulder. IMPRESSION: Slight improvement in congestive heart failure with mild decrease in pulmonary edema. Bilateral pleural effusions are unchanged. Electronically Signed   By: Jeb Levering M.D.   On: 04/04/2017 16:01   Dg Chest Port 1 View  Result Date: 04/02/2017 CLINICAL DATA:  Cough. EXAM: PORTABLE CHEST 1 VIEW COMPARISON:  03/30/2017 FINDINGS: 2 frontal radiographs. Nasogastric tube extends beyond the inferior aspect of the film. Patient rotated to the right. Midline trachea. Normal heart size. Layering bilateral pleural effusions are new or significantly increased. Greater on the right. No pneumothorax. progression of moderate interstitial edema. Development of bibasilar airspace  disease. IMPRESSION: Worsened aeration, with new or increased moderate congestive heart failure. Development of bilateral pleural effusions and adjacent airspace disease, most likely atelectasis. Infection or aspiration cannot be excluded. Electronically Signed   By: Abigail Miyamoto M.D.   On: 04/02/2017 11:14  Dg Chest Portable 1 View  Result Date: 03/30/2017 CLINICAL DATA:  Nasogastric tube placement. Generalized abdominal pain and bloating, acute onset. Initial encounter. EXAM: PORTABLE CHEST 1 VIEW COMPARISON:  Chest radiograph performed 06/10/2016 FINDINGS: The patient's enteric tube is seen extending below the diaphragm. The lungs are hyperexpanded, with biapical scarring. Vascular congestion is noted, with increased interstitial markings. Mild interstitial edema cannot be excluded. No pleural effusion or pneumothorax is seen. The cardiomediastinal silhouette is borderline normal in size. No acute osseous abnormalities are seen. Mild degenerative change is noted at the glenohumeral joints bilaterally. IMPRESSION: 1. Enteric tube noted extending below the diaphragm. 2. Lungs hyperexpanded, with biapical scarring, likely reflecting COPD. Underlying vascular congestion, with increased interstitial markings. Mild interstitial edema cannot be excluded. Electronically Signed   By: Garald Balding M.D.   On: 03/30/2017 23:55   Dg Abd 2 Views  Result Date: 04/05/2017 CLINICAL DATA:  Vomiting with nausea EXAM: ABDOMEN - 2 VIEW COMPARISON:  04/03/2017 FINDINGS: Small bilateral effusions with dense left lower lobe consolidation and patchy infiltrate at the right base. No definite free air beneath the diaphragm. Some contrast present in the colon. Contrast opacifies dilated small bowel in the lower abdomen and pelvis, measuring up to 4 cm. IMPRESSION: Dilated loops of small bowel containing contrast within the lower abdomen and pelvis consistent with bowel obstruction. There is a small amount of contrast present in  the colon. Small bilateral effusions and left greater than right bibasilar infiltrates Electronically Signed   By: Donavan Foil M.D.   On: 04/05/2017 21:07   Dg Abd 2 Views  Result Date: 04/03/2017 CLINICAL DATA:  Patient with history of small bowel obstruction. EXAM: ABDOMEN - 2 VIEW COMPARISON:  Abdominal radiograph 04/01/2017 FINDINGS: Enteric tube tip and side-port project over the stomach. Oral contrast material within the colon. Stable gaseous distended loops of small bowel within the central abdomen. Heterogeneous opacities lung bases bilaterally. Small bilateral pleural effusions. IMPRESSION: Enteric tube tip and side-port project over the stomach. Unchanged gaseous distended loops of bowel within the central abdomen. Layering effusions and underlying opacities. Electronically Signed   By: Lovey Newcomer M.D.   On: 04/03/2017 09:13   Dg Abd 2 Views  Result Date: 04/01/2017 CLINICAL DATA:  Followup small bowel obstruction. EXAM: ABDOMEN - 2 VIEW COMPARISON:  03/31/2017 FINDINGS: There are persistent loops of dilated central small bowel with air-fluid levels consistent with a partial small bowel obstruction, similar to the prior study. There is no free air. Nasogastric tube extends below the diaphragm into the mid stomach. IMPRESSION: 1. Persistent partial small bowel obstruction. No significant change from the prior study. No free air. Electronically Signed   By: Lajean Manes M.D.   On: 04/01/2017 08:42   Dg Abd 2 Views  Result Date: 03/31/2017 CLINICAL DATA:  Follow-up small bowel obstruction. EXAM: ABDOMEN - 2 VIEW COMPARISON:  03/30/2017 abdominal CT FINDINGS: An NG tube is identified with tip overlying the proximal-mid stomach. Dilated small bowel loops are again noted and not significantly changed. There is no evidence of pneumoperitoneum. No other changes identified. IMPRESSION: Little significant change of dilated small bowel loops compatible with small bowel obstruction. No evidence of  pneumoperitoneum. NG tube with tip overlying the proximal-mid stomach. Electronically Signed   By: Margarette Canada M.D.   On: 03/31/2017 21:12   Dg Swallowing Func-speech Pathology  Result Date: 04/05/2017 Objective Swallowing Evaluation: Type of Study: MBS-Modified Barium Swallow Study Patient Details Name: FORREST DEMURO MRN: [id number] Date of Birth:  1929-09-23 Today's Date: 04/05/2017 Time: SLP Start Time (ACUTE ONLY): 0805-SLP Stop Time (ACUTE ONLY): 0819 SLP Time Calculation (min) (ACUTE ONLY): 14 min Past Medical History: Past Medical History: Diagnosis Date . Aortic regurgitation  . Bronchiectasis ([place])  . COPD (chronic obstructive pulmonary disease) ([place])  . DJD (degenerative joint disease)  . HLD (hyperlipidemia)  . HTN (hypertension)  . Mild mitral regurgitation by prior echocardiogram  . Mild tricuspid regurgitation  . Palpitations   a. has known history of PAC's and PVC's. b. 09/2015: monitor showed sinus rhythm with occasional PAC's and a brief episode of PAT. [name] Thyroid disease  Past Surgical History: Past Surgical History: Procedure Laterality Date . ABDOMINAL HYSTERECTOMY  1993 . CATARACT EXTRACTION, BILATERAL   HPI: 83 year old female with a history of bronchiectasis/COPD, chronic respiratory failure on 2 L at night, hypertension, hyperlipidemia, diastolic CHF, hypothyroidism, and PATpresented with a one to two-day history of abdominal pain with associated nausea and vomiting. The patient denied any fevers, chills, chest pain, shortness breath, diarrhea, hematochezia, melena. CT of the abdomen and pelvis at the time of admission showed dilated small bowel loops up to 4.6 cm in the mid pelvis with a transition zone in the right pelvis. NG tube was inserted for decompression and Gen. surgery was consulted to assist with management. Overall, her bowel function has improved, and her NG tube was discontinued 7/8. Her diet was advanced which she tolerated. Her hospitalization has been Located by atrial  tachycardia/atrial fibrillation, acute on chronic diastolic CHF, and aspiration pneumonia. Pt and family report continued swallowing difficulty. ST will evaluate and treat as indicated. No Data Recorded Assessment / Plan / Recommendation CH[facility] IP CLINICAL IMPRESSIONS 04/05/2017 Clinical Impression Pt presents with an inconsistent mild to moderate oropharyngeal dysphagia. Oral phase characterized by decreased bolus cohesion of solids and delayed oral transit. Swallow initiation delayed throughout PO trials, to the level of the valleculae with puree and solids, and to the level of the pyriform sinuses with thin and nectar thick liquids. Prominent vallecular residuals evidenced secondary to reduced tongue base retraction, which were increased with solids and thickened consistencies. Thin liquid alternation aided in clearing vallecular residuals.  Inconsistent penetration occured with thin liquids before the swallow that intermittently reached the level of the vocal cords and was cleared out of laryngeal vestibule intermittently. Volitional throat clear aided in expelling penetrates with cueing from SLP. Nectar thick liquids without penetration. As PO trials progressed pt did evidence cup and straw sips of thin liquids without penetration. Barium tablet administered with thin liquids. Pt displayed difficulty propelling tablet throughout pharynx, initially sticking in the valleculae, then the pyriform sinuses, ultimately requiring nectar thick liquids to clear through pharynx. As pt was struggling to pass pill throughout pharynx, silent aspiration of thin liquids occured after the swallow. Recommend chopped consistencies with thin liquids in isolation. Pt advised to avoid mixed consistencies as airway protection appears compromised when two consistencies administered at the same time. Recommend medicines whole in puree (crush larger meds as needed). ST to follow up for diet tolerance and education with family for safe  swallow strategies  SLP Visit Diagnosis Dysphagia, oropharyngeal phase (R13.12) Attention and concentration deficit following -- Frontal lobe and executive function deficit following -- Impact on safety and function Moderate aspiration risk   [facility] IP TREATMENT RECOMMENDATION 04/05/2017 Treatment Recommendations Therapy as outlined in treatment plan below   Prognosis 04/05/2017 Prognosis for Safe Diet Advancement Good Barriers to Reach Goals -- Barriers/Prognosis Comment -- [facility] IP DIET RECOMMENDATION 04/05/2017 SLP  Diet Recommendations Dysphagia 2 (Fine chop) solids;Thin liquid;No mixed consistencies Liquid Administration via -- Medication Administration Whole meds with puree Compensations Minimize environmental distractions;Slow rate;Small sips/bites;Multiple dry swallows after each bite/sip;Follow solids with liquid;Clear throat intermittently Postural Changes Remain semi-upright after after feeds/meals (Comment);Seated upright at 90 degrees   [facility] IP OTHER RECOMMENDATIONS 04/05/2017 Recommended Consults -- Oral Care Recommendations Oral care BID Other Recommendations --   [facility] IP FOLLOW UP RECOMMENDATIONS 04/05/2017 Follow up Recommendations 24 hour supervision/assistance   [facility] IP FREQUENCY AND DURATION 04/05/2017 Speech Therapy Frequency (ACUTE ONLY) min 2x/week Treatment Duration 2 weeks      [facility] IP ORAL PHASE 04/05/2017 Oral Phase Impaired Oral - Pudding Teaspoon -- Oral - Pudding Cup -- Oral - Honey Teaspoon -- Oral - Honey Cup -- Oral - Nectar Teaspoon Delayed oral transit;Lingual/palatal residue Oral - Nectar Cup Delayed oral transit;Lingual/palatal residue;Weak lingual manipulation Oral - Nectar Straw Weak lingual manipulation;Delayed oral transit;Lingual/palatal residue Oral - Thin Teaspoon Weak lingual manipulation;Delayed oral transit;Lingual/palatal residue Oral - Thin Cup Weak lingual manipulation;Delayed oral transit;Lingual/palatal residue Oral - Thin Straw Delayed oral transit;Weak lingual  manipulation;Lingual/palatal residue Oral - Puree Delayed oral transit;Weak lingual manipulation;Lingual pumping Oral - Mech Soft -- Oral - Regular Lingual/palatal residue;Piecemeal swallowing;Delayed oral transit;Decreased bolus cohesion;Weak lingual manipulation Oral - Multi-Consistency -- Oral - Pill Lingual pumping;Reduced posterior propulsion;Delayed oral transit;Piecemeal swallowing;Lingual/palatal residue Oral Phase - Comment --  [facility] IP PHARYNGEAL PHASE 04/05/2017 Pharyngeal Phase Impaired Pharyngeal- Pudding Teaspoon -- Pharyngeal -- Pharyngeal- Pudding Cup -- Pharyngeal -- Pharyngeal- Honey Teaspoon -- Pharyngeal -- Pharyngeal- Honey Cup -- Pharyngeal -- Pharyngeal- Nectar Teaspoon Reduced tongue base retraction;Delayed swallow initiation-vallecula;Delayed swallow initiation-pyriform sinuses;Pharyngeal residue - valleculae Pharyngeal -- Pharyngeal- Nectar Cup Delayed swallow initiation-vallecula;Delayed swallow initiation-pyriform sinuses;Reduced tongue base retraction;Pharyngeal residue - valleculae Pharyngeal -- Pharyngeal- Nectar Straw -- Pharyngeal -- Pharyngeal- Thin Teaspoon Delayed swallow initiation-vallecula;Penetration/Aspiration during swallow;Penetration/Aspiration before swallow;Pharyngeal residue - valleculae;Reduced tongue base retraction;Reduced epiglottic inversion Pharyngeal Material enters airway, CONTACTS cords and then ejected out Pharyngeal- Thin Cup Delayed swallow initiation-vallecula;Reduced tongue base retraction;Pharyngeal residue - valleculae;Penetration/Aspiration during swallow;Penetration/Aspiration before swallow Pharyngeal Material enters airway, CONTACTS cords and not ejected out Pharyngeal- Thin Straw Penetration/Aspiration during swallow;Penetration/Aspiration before swallow;Delayed swallow initiation-vallecula;Reduced tongue base retraction;Pharyngeal residue - valleculae Pharyngeal Material enters airway, remains ABOVE vocal cords and not ejected out Pharyngeal- Puree  Delayed swallow initiation-vallecula;Pharyngeal residue - valleculae;Reduced tongue base retraction Pharyngeal -- Pharyngeal- Mechanical Soft -- Pharyngeal -- Pharyngeal- Regular Delayed swallow initiation-vallecula;Pharyngeal residue - valleculae;Reduced tongue base retraction Pharyngeal -- Pharyngeal- Multi-consistency -- Pharyngeal -- Pharyngeal- Pill Delayed swallow initiation-vallecula;Other (Comment);Penetration/Apiration after swallow Pharyngeal Material enters airway, passes BELOW cords and not ejected out despite cough attempt by patient Pharyngeal Comment --  No flowsheet data found. Arvil Chaco MA, CCC-SLP Acute Care Speech Language Pathologist  Levi Aland 04/05/2017, 9:48 AM               Assessment/Plan   #1-history of edema--with history of diastolic CHF---this appears relatively baseline her weight appears to be stabilized at around 100 pounds-she has not had a preciptiousn weight gain and she does eat well-the edema is somewhat persistent and she has taken additional Lasix at home so will give her a couple more doses of Lasix 20 mg a day with 10 mEq of potassium-continue to monitor weights and clinical status which appears to be stable-update a metabolic panel tomorrow to ensure stability of her electrolytes-BMP on July 30 was elevated somewhat at 329 but this does not appear grossly increased from what it was previously.  #  2 anxiety-we will institute an order for Xanax 0.25 mg twice a day when necessary for 30 days since she appears sheneeds this especially more so at nighttime.  #3 history of A. fib this appears stable she is on diltiazem as well as Lopressor for rate control as well as Eliquis for anticoagulation   #4 history of small bowel obstruction she appears to have made a nice recovery from this she says she is having fairly regular bowel movements no abdominal discomfort nursing staff has not reported any issues  [phone]

## 2017-04-28 ENCOUNTER — Non-Acute Institutional Stay (SKILLED_NURSING_FACILITY): Payer: Medicare Other | Admitting: Internal Medicine

## 2017-04-28 ENCOUNTER — Encounter (HOSPITAL_COMMUNITY)
Admission: RE | Admit: 2017-04-28 | Discharge: 2017-04-28 | Disposition: A | Discharge status: Home / Self Care | Payer: Medicare Other | Source: Skilled Nursing Facility | Attending: Internal Medicine | Admitting: Internal Medicine

## 2017-04-28 ENCOUNTER — Encounter: Payer: Self-pay | Admitting: Internal Medicine

## 2017-04-28 DIAGNOSIS — J438 Other emphysema: Secondary | ICD-10-CM

## 2017-04-28 DIAGNOSIS — I480 Paroxysmal atrial fibrillation: Secondary | ICD-10-CM

## 2017-04-28 DIAGNOSIS — J449 Chronic obstructive pulmonary disease, unspecified: Secondary | ICD-10-CM | POA: Insufficient documentation

## 2017-04-28 DIAGNOSIS — I5032 Chronic diastolic (congestive) heart failure: Secondary | ICD-10-CM

## 2017-04-28 DIAGNOSIS — E038 Other specified hypothyroidism: Secondary | ICD-10-CM | POA: Diagnosis not present

## 2017-04-28 DIAGNOSIS — J690 Pneumonitis due to inhalation of food and vomit: Secondary | ICD-10-CM | POA: Insufficient documentation

## 2017-04-28 DIAGNOSIS — K56609 Unspecified intestinal obstruction, unspecified as to partial versus complete obstruction: Secondary | ICD-10-CM | POA: Diagnosis not present

## 2017-04-28 DIAGNOSIS — E43 Unspecified severe protein-calorie malnutrition: Secondary | ICD-10-CM | POA: Insufficient documentation

## 2017-04-28 DIAGNOSIS — J9611 Chronic respiratory failure with hypoxia: Secondary | ICD-10-CM | POA: Insufficient documentation

## 2017-04-28 DIAGNOSIS — J439 Emphysema, unspecified: Secondary | ICD-10-CM | POA: Insufficient documentation

## 2017-04-28 LAB — BASIC METABOLIC PANEL
ANION GAP: 8 (ref 5–15)
BUN: 27 mg/dL — AB (ref 6–20)
CO2: 35 mmol/L — ABNORMAL HIGH (ref 22–32)
CREATININE: 1.12 mg/dL — AB (ref 0.44–1.00)
Calcium: 9.1 mg/dL (ref 8.9–10.3)
Chloride: 95 mmol/L — ABNORMAL LOW (ref 101–111)
GFR calc Af Amer: 50 mL/min — ABNORMAL LOW (ref >60)
GFR, EST NON AFRICAN AMERICAN: 43 mL/min — AB (ref >60)
GLUCOSE: 91 mg/dL (ref 65–99)
Potassium: 4.1 mmol/L (ref 3.5–5.1)
Sodium: 138 mmol/L (ref 135–145)

## 2017-04-28 NOTE — Progress Notes (Signed)
Location:   [facility] Room Number: 124/P Place of Service:  SNF (31)  Provider: Granville Lewis  PCP: Unk Pinto, MD Patient Care Team: Unk Pinto, MD as PCP - General (Internal Medicine) Deneise Lever, MD as Consulting Physician (Pulmonary Disease) Lelon Perla, MD as Consulting Physician (Cardiology) Inda Castle, MD as Consulting Physician (Gastroenterology) Rolm Bookbinder, MD as Consulting Physician (Dermatology)  Extended Emergency Contact Information Primary Emergency Contact: Bellizzi,[name] Address: [phone] [facility] [address]          [place] [address] [name] of [place] Phone: [phone] Mobile Phone: [phone] Relation: Spouse Secondary Emergency Contact: Charlene Brooke, [place] of [place] Phone: [phone] Mobile Phone: [phone] Relation: Daughter  Code Status: Full Code Goals of care:  Advanced Directive information Advanced Directives 04/28/2017  Does Patient Have a Medical Advance Directive? Yes  Type of Advance Directive (No Data)  Does patient want to make changes to medical advance directive? No - Patient declined  Copy of [place] in Chart? -  Would patient like information on creating a medical advance directive? No - Patient declined  Pre-existing out of facility DNR order (yellow form or pink MOST form) -     Allergies  Allergen Reactions  . Vancomycin     Worsening kidney function  . Sulfa Antibiotics Palpitations    Unknown    Chief Complaint  Patient presents with  . Discharge Note    HPI:  83 y.o. female  for discharge from facility this weekend.  She is here for rehabilitation after having a small bowel obstruction which was managed conservatively with IV fluids and an NG tube.  She also had rapid ventricular fibrillation was started on aEliquis--as well as Cardizem-she converted spontaneously to normal sinus rhythm.  She was also  treated for aspiration pneumonia with Dara Lords did go into CHF this was thought secondary to her atrial fibrillation-she tells a she's been on Lasix at times at home and she has some increased edema-we have given her occasional doses of Lasix and actually we did started again yesterday secondary to concerns of some increased edema.  Her weight yesterday was 101 this apparently is down slightly from a previous weight but up about 4 pounds from her admission weight I suspect she is eating somewhat better as well which is contributing to this.  She will be going home with her daughters help her family is very supportive-she will need PT and OT for further strengthening as well as nursing support to help with her multiple medical issues-she is on chronic oxygen as well.  She will need a shower chair and Rollator walker secondary to some continued weakness and frailty.  Currently she has no acute complaints she continues to have some edema more so on the left versus the right she says the left is always somewhat increased with a previous history of fractures in the past of her left leg.  She did have labs done today which showed stable renal function with a creatinine 1.12 BUN of 27 sodium 138 potassium 4.1.  She has no acute complaints today is looking forward to going home    Past Medical History:  Diagnosis Date  . Aortic regurgitation   . Bronchiectasis ([place])   . COPD (chronic obstructive pulmonary disease) ([place])   . DJD (degenerative joint disease)   . HLD (hyperlipidemia)   . HTN (hypertension)   . Mild mitral regurgitation  by prior echocardiogram   . Mild tricuspid regurgitation   . Palpitations    a. has known history of PAC's and PVC's. b. 09/2015: monitor showed sinus rhythm with occasional PAC's and a brief episode of PAT.  [name] Thyroid disease     Past Surgical History:  Procedure Laterality Date  . ABDOMINAL HYSTERECTOMY  1993  . CATARACT EXTRACTION, BILATERAL         reports that she quit smoking about 42 years ago. Her smoking use included Cigarettes. She has a 20.00 pack-year smoking history. She has never used smokeless tobacco. She reports that she does not drink alcohol or use drugs. Social History   Social History  . Marital status: Married    Spouse name: N/A  . Number of children: 5  . Years of education: N/A   Occupational History  . retired    Social History Main Topics  . Smoking status: Former Smoker    Packs/day: 1.00    Years: 20.00    Types: Cigarettes    Quit date: 09/27/1974  . Smokeless tobacco: Never Used  . Alcohol use No  . Drug use: No  . Sexual activity: No   Other Topics Concern  . Not on file   Social History Narrative  . No narrative on file   Functional Status Survey:    Allergies  Allergen Reactions  . Vancomycin     Worsening kidney function  . Sulfa Antibiotics Palpitations    Unknown    Pertinent  Health Maintenance Due  Topic Date Due  . INFLUENZA VACCINE  08/27/2017 (Originally 04/27/2017)  . DEXA SCAN  Completed  . PNA vac Low Risk Adult  Completed    Medications: Outpatient Encounter Prescriptions as of 04/28/2017  Medication Sig  . ALPRAZolam (XANAX) 0.25 MG tablet Take 0.25 mg by mouth as needed for anxiety.  [name] apixaban (ELIQUIS) 2.5 MG TABS tablet Take 1 tablet (2.5 mg total) by mouth 2 (two) times daily.  . Ascorbic Acid (VITAMIN C) 500 MG tablet Take 500 mg by mouth daily.    . bisacodyl (DULCOLAX) 10 MG suppository Place 1 suppository (10 mg total) rectally every morning.  . cholecalciferol (VITAMIN D) 1000 UNITS tablet Take 1,000 Units by mouth daily.   [name] diltiazem (CARDIZEM) 90 MG tablet Take 1 tablet (90 mg total) by mouth every 6 (six) hours.  . feeding supplement, ENSURE ENLIVE, (ENSURE ENLIVE) LIQD Take 237 mLs by mouth 2 (two) times daily between meals.  . furosemide (LASIX) 20 MG tablet Take 20 mg by mouth daily. For 2 days stop 04/29/2017  . ipratropium-albuterol (DUONEB) 0.5-2.5  (3) MG/3ML SOLN Take 3 mLs by nebulization every 6 (six) hours as needed.  [name] levothyroxine (SYNTHROID, LEVOTHROID) 50 MCG tablet TAKE 1 TABLET BY MOUTH DAILY OR AS DIRECTED  . metoprolol tartrate 37.5 MG TABS Take 37.5 mg by mouth 2 (two) times daily.  . mirtazapine (REMERON) 7.5 MG tablet Take 1 tablet (7.5 mg total) by mouth at bedtime.  [name] omeprazole (PRILOSEC) 40 MG capsule TAKE ONE (1) CAPSULE EACH DAY  . potassium chloride (K-DUR,KLOR-CON) 10 MEQ tablet Take 10 mEq by mouth daily. For 2 days stop 04/29/2017  . senna-docusate (SENOKOT-S) 8.6-50 MG tablet Take 1 tablet by mouth at bedtime.  . simethicone (MYLICON) 80 MG chewable tablet Chew 1 tablet (80 mg total) by mouth 2 (two) times daily.   No facility-administered encounter medications on file as of 04/28/2017.      Review of Systems  General she does not complaining of any fever or chills her weight appears to be stabilized  Skin does not complain of rashes or itching.  Head ears eyes nose mouth and throat denies visual changes or sore throat.  Resp- has a history of COPD but is not really complaining of any increased shortness of breath-does not really complain of cough.  Cardiac does not complain of chest pain has some somewhat persistent lower extremity edema   GI does not complain of any abdominal discomfort says she is having regular bowel movements does not complain of nausea or vomiting.  Muscle skeletal continue with generalized weakness does not really complain of joint pain but she says sometimes she makes moves she feels a little discomfort in her low left thorax she attributes this to exercising muscle that she has not used in a while since she has been receiving therapy  Neurologic is not complaining of any dizziness headache or syncopal-type feelings.  Psych does not complaining of anxiety and depression actually had been in activities this new music and was eager to get back to listen to music Andrea [name]  for  Neurologic does not complain of dizziness headache or syncope.  Psych continues to be very pleasant does not complain of depression or anxiety appears to be adapting well to the facility--she was listening to music in activities and wanted to get back to listening to that-looking forward to going home     Vitals:   04/28/17 1113  BP: (!) 115/51  Pulse: 72  Resp: 18  Temp: (!) 97.5 F (36.4 C)  TempSrc: Oral   Weight is 101 pounds once weekly  Physical Exam   In general this continues to be a very pleasant somewhat frail elderly female in no distress sitting comfortably in her wheelchair  Her skin is warm and dry.  Eyes visual acuity appears grossly intact.  Oropharynx is clear mucous membranes moist.  Chest continues with  shallow air entry but there is no labored breathing or overt congestion.--  Heart Regular rate and rhythm with occasional irregular beats-she continues to have some lower extremity edema bit more on the left versus the right this appears to be somewhat persistent again we have started her back on Lasix-   Her abdomen is somewhat protuberant soft nontender with positive bowel sounds.  Muscle skeletal moves all extremities 4 with some weakness of her right arm with a history of arthritis is not new ambulates well and wheelchair   Neurologic is grossly intact her speech is clear no lateralizing findings.  Psych she is alert and oriented pleasant and appropriate  Labs reviewed: Basic Metabolic Panel:  Recent Labs  04/02/17 0605  04/04/17 0502 04/05/17 0359  04/18/17 0600 04/25/17 0430 04/28/17 0700  NA 145  < > 147* 145  < > 139 140 138  K 3.3*  < > 3.0* 3.5  < > 3.7 3.8 4.1  CL 105  < > 98* 98*  < > 99* 98* 95*  CO2 32  < > 40* 40*  < > 32 35* 35*  GLUCOSE 77  < > 151* 123*  < > 88 92 91  BUN 25*  < > 24* 25*  < > 21* 22* 27*  CREATININE 0.81  < > 0.93 0.98  < > 1.09* 1.05* 1.12*  CALCIUM 8.4*  < > 8.4* 8.9  < > 8.8*  8.4* 9.1  MG 1.7  --  1.4* 1.9  --   --   --   --   < > =  values in this interval not displayed. Liver Function Tests:  Recent Labs  03/30/17 1850 03/31/17 0630 04/18/17 0600  AST _0 ALT 12* 9* 12*  ALKPHOS 75 59 58  BILITOT 1.0 1.0 0.4  PROT 8.3* 6.4* 5.8*  ALBUMIN 4.2 3.3* 2.5*    Recent Labs  03/30/17 1850  LIPASE 47   No results for input(s): AMMONIA in the last 8760 hours. CBC:  Recent Labs  04/05/17 0359  04/14/17 0615 04/18/17 0600 04/25/17 0430  WBC 10.4  < > 9.3 8.1 7.2  NEUTROABS 8.8*  --  7.1  --  4.7  HGB 12.3  < > 10.7* 10.5* 10.3*  HCT 39.7  < > 33.4* 33.6* 32.9*  MCV 97.1  < > 94.6 93.9 96.5  PLT 190  < > 259 361 290  < > = values in this interval not displayed. Cardiac Enzymes:  Recent Labs  04/09/17 1911 04/10/17 0144 04/10/17 0642  TROPONINI <0.03 <0.03 <0.03   BNP: Invalid input(s): POCBNP CBG:  Recent Labs  04/04/17 0815  GLUCAP 128*    Procedures and Imaging Studies During Stay: Dg Abd 1 View  Result Date: 04/01/2017 CLINICAL DATA:  NG tube position. EXAM: ABDOMEN - 1 VIEW COMPARISON:  04/01/2017 FINDINGS: NG tube tip is in the mid stomach with the side port in the proximal stomach. IMPRESSION: NG tube tip in the mid stomach. Electronically Signed   By: Rolm Baptise M.D.   On: 04/01/2017 11:49   Ct Abdomen Pelvis W Contrast  Result Date: 04/08/2017 CLINICAL DATA:  Abdominal pain with nausea and vomiting. Small bowel obstruction. EXAM: CT ABDOMEN AND PELVIS WITH CONTRAST TECHNIQUE: Multidetector CT imaging of the abdomen and pelvis was performed using the standard protocol following bolus administration of intravenous contrast. CONTRAST:  83m ISOVUE-300 IOPAMIDOL (ISOVUE-300) INJECTION 61% COMPARISON:  03/30/2017 FINDINGS: Lower Chest: New small pleural effusions, left side greater than right. New left lower lobe atelectasis versus infiltrate. Mild compressive atelectasis in right lung base. Hepatobiliary: 1 cm homogeneous  hypervascular lesion in the anterior left hepatic lobe shows no significant change since recent study. This most likely represents a tiny flash-filling hemangioma. Tiny sub-cm cyst again seen in the posterior left hepatic lobe. Gallbladder is unremarkable. Pancreas:  No mass or inflammatory changes. Spleen: Within normal limits in size and appearance. Adrenals/Urinary Tract: No masses identified. Tiny bilateral renal cysts again noted. No evidence of hydronephrosis. Stomach/Bowel: There has been near complete resolution of dilated bowel loops since prior study. Diverticulosis again seen involving the descending and sigmoid colon, however there is no evidence of diverticulitis. Vascular/Lymphatic: No pathologically enlarged lymph nodes. No abdominal aortic aneurysm. Aortic atherosclerosis. Reproductive: Prior hysterectomy noted. Adnexal regions are unremarkable in appearance. Other:  None. Musculoskeletal: No suspicious bone lesions identified. Advanced lumbar spondylosis and moderate dextroscoliosis again noted. IMPRESSION: Near complete resolution of dilated small bowel loops since previous study. Colonic diverticulosis, without radiographic evidence of diverticulitis. New small bilateral pleural effusions, left side greater than right, and left lower lobe atelectasis versus infiltrate. Aortic atherosclerosis. Electronically Signed   By: JEarle GellM.D.   On: 04/08/2017 09:40   Ct Abdomen Pelvis W Contrast  Result Date: 03/30/2017 CLINICAL DATA:  Abdominal pain with bloating.  Nausea and vomiting. EXAM: CT ABDOMEN AND PELVIS WITH CONTRAST TECHNIQUE: Multidetector CT imaging of the abdomen and pelvis was performed using the standard protocol following bolus administration of intravenous contrast. CONTRAST:  360mISOVUE-300 IOPAMIDOL (ISOVUE-300) INJECTION 61%, 10062mSOVUE-300 IOPAMIDOL (ISOVUE-300) INJECTION 61%  COMPARISON:  08/15/2007 FINDINGS: Lower chest: Tubular branching structures posterior lung bases  suggest impacted airways but difficult to assess given the substantial breathing motion artifact. Hepatobiliary: 7 mm hypervascular lesion subcapsular left liver cannot be further characterized. Mild intrahepatic biliary duct prominence. Gallbladder unremarkable. Extrahepatic common bile duct measures 8 mm diameter, increased in the interval. Pancreas: No focal mass lesion. No dilatation of the main duct. No intraparenchymal cyst. No peripancreatic edema. Spleen: No splenomegaly. No focal mass lesion. Adrenals/Urinary Tract: No adrenal nodule or mass. Cortical scarring noted both kidneys with small renal cysts noted bilaterally. No evidence for hydroureter. Bladder is distended. Stomach/Bowel: Stomach is mildly distended. Duodenum unremarkable. Small bowel loops in the lower abdomen and pelvis are fluid-filled and dilated. A prominently dilated loop in the mid pelvis measures up to 4.6 cm diameter. This dilated loop pin tracts down to the pelvis where a discrete transition point is identified in the right pelvis (see image 53 series 2 and also visible on sagittal image 52 of series 6). I cannot identify dilated small bowel beyond this transition zone to suggest a closed loop etiology. The terminal ileum is collapsed. The appendix is not visualized, but there is no edema or inflammation in the region of the cecum. Diverticuli are seen scattered along the entire length of the colon without CT findings of diverticulitis. Vascular/Lymphatic: There is abdominal aortic atherosclerosis without aneurysm. There is no gastrohepatic or hepatoduodenal ligament lymphadenopathy. No intraperitoneal or retroperitoneal lymphadenopathy. No pelvic sidewall lymphadenopathy. Reproductive: Uterus is surgically absent. There is no adnexal mass. Other: Free fluid is identified around the liver and spleen. Interloop mesenteric fluid is identified in the central small bowel mesentery. Musculoskeletal: Bone windows reveal no worrisome lytic  or sclerotic osseous lesions. IMPRESSION: 1. Mechanical small bowel obstruction with small bowel loops dilated up to 4.6 cm diameter and a definite transition zone identified in the right pelvis. Although there is some question of twisting in the region of the transition zone, there is no second transition zone identified nor dilated small bowel after the transition zone to suggest a closed loop obstruction. As such, imaging features likely related to an adhesion given the history of hysterectomy. There is fluid around the liver and spleen with interloop mesenteric fluid associated with the abnormal small bowel loops. No small bowel wall thickening or pneumatosis at this time to suggest overt bowel ischemia. 2. Probable airway impaction in both posterior lower lobes although not well assessed given motion artifact. Electronically Signed   By: Misty Stanley M.D.   On: 03/30/2017 21:51   Dg Chest Port 1 View  Result Date: 04/07/2017 CLINICAL DATA:  Vomiting with concern for aspiration pneumonitis EXAM: PORTABLE CHEST 1 VIEW COMPARISON:  April 04, 2017 FINDINGS: There is persistent interstitial pulmonary edema. There is consolidation in the left lower lobe with left pleural effusion stable. There is less airspace opacity in the right base compared to recent study. Small right pleural effusion is present. Heart is mildly enlarged with pulmonary venous hypertension. There is aortic atherosclerosis. No new opacity evident. No adenopathy. There is advanced arthropathy in the right shoulder, stable. IMPRESSION: Slightly less opacity in the right base, likely slightly less pulmonary edema. Changes of congestive heart failure overall persist. Question superimposed pneumonia left lower lobe given the degree of consolidation. Stable left pleural effusion. There is aortic atherosclerosis. No evident. No new opacity evident. Advanced arthropathy right shoulder. Aortic Atherosclerosis (ICD10-I70.0). Electronically Signed   By:  Lowella Grip III M.D.   On: 04/07/2017  08:25   Dg Chest Port 1 View  Result Date: 04/04/2017 CLINICAL DATA:  Acute on chronic diastolic heart failure. EXAM: PORTABLE CHEST 1 VIEW COMPARISON:  Radiograph 04/02/2017, 03/30/2017 FINDINGS: Stable heart size and mediastinal contours. Stable hazy opacity at both lung bases likely comminution of pleural fluid and atelectasis. Pulmonary edema with slight improvement from prior exam. Biapical pleuroparenchymal scarring again seen. No pneumothorax or new confluent airspace disease. Chronic change in the right shoulder. IMPRESSION: Slight improvement in congestive heart failure with mild decrease in pulmonary edema. Bilateral pleural effusions are unchanged. Electronically Signed   By: Jeb Levering M.D.   On: 04/04/2017 16:01   Dg Chest Port 1 View  Result Date: 04/02/2017 CLINICAL DATA:  Cough. EXAM: PORTABLE CHEST 1 VIEW COMPARISON:  03/30/2017 FINDINGS: 2 frontal radiographs. Nasogastric tube extends beyond the inferior aspect of the film. Patient rotated to the right. Midline trachea. Normal heart size. Layering bilateral pleural effusions are new or significantly increased. Greater on the right. No pneumothorax. progression of moderate interstitial edema. Development of bibasilar airspace disease. IMPRESSION: Worsened aeration, with new or increased moderate congestive heart failure. Development of bilateral pleural effusions and adjacent airspace disease, most likely atelectasis. Infection or aspiration cannot be excluded. Electronically Signed   By: Abigail Miyamoto M.D.   On: 04/02/2017 11:14   Dg Chest Portable 1 View  Result Date: 03/30/2017 CLINICAL DATA:  Nasogastric tube placement. Generalized abdominal pain and bloating, acute onset. Initial encounter. EXAM: PORTABLE CHEST 1 VIEW COMPARISON:  Chest radiograph performed 06/10/2016 FINDINGS: The patient's enteric tube is seen extending below the diaphragm. The lungs are hyperexpanded, with biapical  scarring. Vascular congestion is noted, with increased interstitial markings. Mild interstitial edema cannot be excluded. No pleural effusion or pneumothorax is seen. The cardiomediastinal silhouette is borderline normal in size. No acute osseous abnormalities are seen. Mild degenerative change is noted at the glenohumeral joints bilaterally. IMPRESSION: 1. Enteric tube noted extending below the diaphragm. 2. Lungs hyperexpanded, with biapical scarring, likely reflecting COPD. Underlying vascular congestion, with increased interstitial markings. Mild interstitial edema cannot be excluded. Electronically Signed   By: Garald Balding M.D.   On: 03/30/2017 23:55   Dg Abd 2 Views  Result Date: 04/05/2017 CLINICAL DATA:  Vomiting with nausea EXAM: ABDOMEN - 2 VIEW COMPARISON:  04/03/2017 FINDINGS: Small bilateral effusions with dense left lower lobe consolidation and patchy infiltrate at the right base. No definite free air beneath the diaphragm. Some contrast present in the colon. Contrast opacifies dilated small bowel in the lower abdomen and pelvis, measuring up to 4 cm. IMPRESSION: Dilated loops of small bowel containing contrast within the lower abdomen and pelvis consistent with bowel obstruction. There is a small amount of contrast present in the colon. Small bilateral effusions and left greater than right bibasilar infiltrates Electronically Signed   By: Donavan Foil M.D.   On: 04/05/2017 21:07   Dg Abd 2 Views  Result Date: 04/03/2017 CLINICAL DATA:  Patient with history of small bowel obstruction. EXAM: ABDOMEN - 2 VIEW COMPARISON:  Abdominal radiograph 04/01/2017 FINDINGS: Enteric tube tip and side-port project over the stomach. Oral contrast material within the colon. Stable gaseous distended loops of small bowel within the central abdomen. Heterogeneous opacities lung bases bilaterally. Small bilateral pleural effusions. IMPRESSION: Enteric tube tip and side-port project over the stomach. Unchanged  gaseous distended loops of bowel within the central abdomen. Layering effusions and underlying opacities. Electronically Signed   By: Lovey Newcomer M.D.   On: 04/03/2017 09:13  Dg Abd 2 Views  Result Date: 04/01/2017 CLINICAL DATA:  Followup small bowel obstruction. EXAM: ABDOMEN - 2 VIEW COMPARISON:  03/31/2017 FINDINGS: There are persistent loops of dilated central small bowel with air-fluid levels consistent with a partial small bowel obstruction, similar to the prior study. There is no free air. Nasogastric tube extends below the diaphragm into the mid stomach. IMPRESSION: 1. Persistent partial small bowel obstruction. No significant change from the prior study. No free air. Electronically Signed   By: Lajean Manes M.D.   On: 04/01/2017 08:42   Dg Abd 2 Views  Result Date: 03/31/2017 CLINICAL DATA:  Follow-up small bowel obstruction. EXAM: ABDOMEN - 2 VIEW COMPARISON:  03/30/2017 abdominal CT FINDINGS: An NG tube is identified with tip overlying the proximal-mid stomach. Dilated small bowel loops are again noted and not significantly changed. There is no evidence of pneumoperitoneum. No other changes identified. IMPRESSION: Little significant change of dilated small bowel loops compatible with small bowel obstruction. No evidence of pneumoperitoneum. NG tube with tip overlying the proximal-mid stomach. Electronically Signed   By: Margarette Canada M.D.   On: 03/31/2017 21:12   Dg Swallowing Func-speech Pathology  Result Date: 04/05/2017 Objective Swallowing Evaluation: Type of Study: MBS-Modified Barium Swallow Study Patient Details Name: NATE PERRI MRN: [id number] Date of Birth: 28-Jan-1929 Today's Date: 04/05/2017 Time: SLP Start Time (ACUTE ONLY): 0805-SLP Stop Time (ACUTE ONLY): 0819 SLP Time Calculation (min) (ACUTE ONLY): 14 min Past Medical History: Past Medical History: Diagnosis Date . Aortic regurgitation  . Bronchiectasis ([place])  . COPD (chronic obstructive pulmonary disease) ([place])  . DJD  (degenerative joint disease)  . HLD (hyperlipidemia)  . HTN (hypertension)  . Mild mitral regurgitation by prior echocardiogram  . Mild tricuspid regurgitation  . Palpitations   a. has known history of PAC's and PVC's. b. 09/2015: monitor showed sinus rhythm with occasional PAC's and a brief episode of PAT. [name] Thyroid disease  Past Surgical History: Past Surgical History: Procedure Laterality Date . ABDOMINAL HYSTERECTOMY  1993 . CATARACT EXTRACTION, BILATERAL   HPI: 83 year old female with a history of bronchiectasis/COPD, chronic respiratory failure on 2 L at night, hypertension, hyperlipidemia, diastolic CHF, hypothyroidism, and PATpresented with a one to two-day history of abdominal pain with associated nausea and vomiting. The patient denied any fevers, chills, chest pain, shortness breath, diarrhea, hematochezia, melena. CT of the abdomen and pelvis at the time of admission showed dilated small bowel loops up to 4.6 cm in the mid pelvis with a transition zone in the right pelvis. NG tube was inserted for decompression and Gen. surgery was consulted to assist with management. Overall, her bowel function has improved, and her NG tube was discontinued 7/8. Her diet was advanced which she tolerated. Her hospitalization has been Located by atrial tachycardia/atrial fibrillation, acute on chronic diastolic CHF, and aspiration pneumonia. Pt and family report continued swallowing difficulty. ST will evaluate and treat as indicated. No Data Recorded Assessment / Plan / Recommendation CHL IP CLINICAL IMPRESSIONS 04/05/2017 Clinical Impression Pt presents with an inconsistent mild to moderate oropharyngeal dysphagia. Oral phase characterized by decreased bolus cohesion of solids and delayed oral transit. Swallow initiation delayed throughout PO trials, to the level of the valleculae with puree and solids, and to the level of the pyriform sinuses with thin and nectar thick liquids. Prominent vallecular residuals evidenced  secondary to reduced tongue base retraction, which were increased with solids and thickened consistencies. Thin liquid alternation aided in clearing vallecular residuals.  Inconsistent penetration occured with  thin liquids before the swallow that intermittently reached the level of the vocal cords and was cleared out of laryngeal vestibule intermittently. Volitional throat clear aided in expelling penetrates with cueing from SLP. Nectar thick liquids without penetration. As PO trials progressed pt did evidence cup and straw sips of thin liquids without penetration. Barium tablet administered with thin liquids. Pt displayed difficulty propelling tablet throughout pharynx, initially sticking in the valleculae, then the pyriform sinuses, ultimately requiring nectar thick liquids to clear through pharynx. As pt was struggling to pass pill throughout pharynx, silent aspiration of thin liquids occured after the swallow. Recommend chopped consistencies with thin liquids in isolation. Pt advised to avoid mixed consistencies as airway protection appears compromised when two consistencies administered at the same time. Recommend medicines whole in puree (crush larger meds as needed). ST to follow up for diet tolerance and education with family for safe swallow strategies  SLP Visit Diagnosis Dysphagia, oropharyngeal phase (R13.12) Attention and concentration deficit following -- Frontal lobe and executive function deficit following -- Impact on safety and function Moderate aspiration risk   CHL IP TREATMENT RECOMMENDATION 04/05/2017 Treatment Recommendations Therapy as outlined in treatment plan below   Prognosis 04/05/2017 Prognosis for Safe Diet Advancement Good Barriers to Reach Goals -- Barriers/Prognosis Comment -- [facility] IP DIET RECOMMENDATION 04/05/2017 SLP Diet Recommendations Dysphagia 2 (Fine chop) solids;Thin liquid;No mixed consistencies Liquid Administration via -- Medication Administration Whole meds with puree  Compensations Minimize environmental distractions;Slow rate;Small sips/bites;Multiple dry swallows after each bite/sip;Follow solids with liquid;Clear throat intermittently Postural Changes Remain semi-upright after after feeds/meals (Comment);Seated upright at 90 degrees   CHL IP OTHER RECOMMENDATIONS 04/05/2017 Recommended Consults -- Oral Care Recommendations Oral care BID Other Recommendations --   [facility] IP FOLLOW UP RECOMMENDATIONS 04/05/2017 Follow up Recommendations 24 hour supervision/assistance   [facility] IP FREQUENCY AND DURATION 04/05/2017 Speech Therapy Frequency (ACUTE ONLY) min 2x/week Treatment Duration 2 weeks      CHL IP ORAL PHASE 04/05/2017 Oral Phase Impaired Oral - Pudding Teaspoon -- Oral - Pudding Cup -- Oral - Honey Teaspoon -- Oral - Honey Cup -- Oral - Nectar Teaspoon Delayed oral transit;Lingual/palatal residue Oral - Nectar Cup Delayed oral transit;Lingual/palatal residue;Weak lingual manipulation Oral - Nectar Straw Weak lingual manipulation;Delayed oral transit;Lingual/palatal residue Oral - Thin Teaspoon Weak lingual manipulation;Delayed oral transit;Lingual/palatal residue Oral - Thin Cup Weak lingual manipulation;Delayed oral transit;Lingual/palatal residue Oral - Thin Straw Delayed oral transit;Weak lingual manipulation;Lingual/palatal residue Oral - Puree Delayed oral transit;Weak lingual manipulation;Lingual pumping Oral - Mech Soft -- Oral - Regular Lingual/palatal residue;Piecemeal swallowing;Delayed oral transit;Decreased bolus cohesion;Weak lingual manipulation Oral - Multi-Consistency -- Oral - Pill Lingual pumping;Reduced posterior propulsion;Delayed oral transit;Piecemeal swallowing;Lingual/palatal residue Oral Phase - Comment --  CHL IP PHARYNGEAL PHASE 04/05/2017 Pharyngeal Phase Impaired Pharyngeal- Pudding Teaspoon -- Pharyngeal -- Pharyngeal- Pudding Cup -- Pharyngeal -- Pharyngeal- Honey Teaspoon -- Pharyngeal -- Pharyngeal- Honey Cup -- Pharyngeal -- Pharyngeal- Nectar Teaspoon  Reduced tongue base retraction;Delayed swallow initiation-vallecula;Delayed swallow initiation-pyriform sinuses;Pharyngeal residue - valleculae Pharyngeal -- Pharyngeal- Nectar Cup Delayed swallow initiation-vallecula;Delayed swallow initiation-pyriform sinuses;Reduced tongue base retraction;Pharyngeal residue - valleculae Pharyngeal -- Pharyngeal- Nectar Straw -- Pharyngeal -- Pharyngeal- Thin Teaspoon Delayed swallow initiation-vallecula;Penetration/Aspiration during swallow;Penetration/Aspiration before swallow;Pharyngeal residue - valleculae;Reduced tongue base retraction;Reduced epiglottic inversion Pharyngeal Material enters airway, CONTACTS cords and then ejected out Pharyngeal- Thin Cup Delayed swallow initiation-vallecula;Reduced tongue base retraction;Pharyngeal residue - valleculae;Penetration/Aspiration during swallow;Penetration/Aspiration before swallow Pharyngeal Material enters airway, CONTACTS cords and not ejected out Pharyngeal- Thin Straw Penetration/Aspiration during swallow;Penetration/Aspiration before swallow;Delayed swallow  initiation-vallecula;Reduced tongue base retraction;Pharyngeal residue - valleculae Pharyngeal Material enters airway, remains ABOVE vocal cords and not ejected out Pharyngeal- Puree Delayed swallow initiation-vallecula;Pharyngeal residue - valleculae;Reduced tongue base retraction Pharyngeal -- Pharyngeal- Mechanical Soft -- Pharyngeal -- Pharyngeal- Regular Delayed swallow initiation-vallecula;Pharyngeal residue - valleculae;Reduced tongue base retraction Pharyngeal -- Pharyngeal- Multi-consistency -- Pharyngeal -- Pharyngeal- Pill Delayed swallow initiation-vallecula;Other (Comment);Penetration/Apiration after swallow Pharyngeal Material enters airway, passes BELOW cords and not ejected out despite cough attempt by patient Pharyngeal Comment --  No flowsheet data found. Arvil Chaco MA, CCC-SLP Acute Care Speech Language Pathologist  Levi Aland 04/05/2017, 9:48  AM               Assessment/Plan:     1 history of small bowel obstruction--she appears to have made a good recovery from this she is on Senokot and Dulcolax-she reports regular bowel movements does not complaining of any abdominal pain-will monitor will need follow-up by primary care provider.  #2-history of edema with diastolic CHF she has been started on a short course of Lasix will continue the Lasix 20 mg a day indefinitely of note she is about to go home so this will need close follow-up by primary care provider also home health will have to draw lab first laboratory day next week to keep an eye on her renal function and electrolytes labs today show stability.  Clinically she appears to be stable but the edema is persistent.  Also will order a venous Doppler of her left leg rule out DVT she is already on  Eliquis  but would like to double check to make sure this is stable before she goes home.  #3 history of-AFIB--his appears rate controlled she has a few irregular beats at times but rate appears to be controlled she is on diltiazem as well as Lopressor-again she is on Eliquis for anticoagulation.  #4-history of COPD she continues on duo nebs as needed-this does not appear to really have been in issue during her stay here her  status has been stable.  #5 history of anxiety we have resumed her Xanax secondary mostly to nighttime anxiety and insomnia she apparently has been on this at home and tolerated well.  #6-history of protein calorie malnutrition nd apparently poor appetite she is on Remeron at this point will monitor she has gained a small amount away I suspect some of this may be edema related some of this may be   appetite related.  #7 history of hypothyroidism she continues on Synthroid TSH was within normal limits in the hospital will need follow-up by primary care provider.  #8-history of aspiration pneumonia again she was treated for this in hospital appear successfully she  does have coexistent COPD she does have when necessary nebulizers does not report any increased cough from baseline-does not really complain of shortness of breath.--She does continue on oxygen  Again we'll continue her on Lasix 20 mg a day-will update another metabolic panel on Saturday before discharge-also will need to update by home health on Monday-to ensure stability of her electrolytes and renal function and will need expedient follow-up by primary care provider.  Again she will need PT and OT for strengthening as well as nursing support to follow her multiple medical issues-also will need of Rollator walker as well as a shower chair secondary to continued frailty and fall risk. She also continues on oxygen  Also will obtain a venous Doppler of left leg before discharge rule out any DVT       #  9 history of vitamin D deficiency she is on supplementation level was 52 on lab done in April 2018.  #10 history of hypertension this appears stable on diltiazem as well as Lopressor,   [id number] note greater than 30 minutes spent on this discharge summary-greater than 50% of time spent coordinating plan of care for numerous diagnoses

## 2017-04-29 ENCOUNTER — Ambulatory Visit (HOSPITAL_COMMUNITY)
Admit: 2017-04-29 | Discharge: 2017-04-29 | Disposition: A | Discharge status: Home / Self Care | Payer: Medicare Other | Attending: Internal Medicine | Admitting: Internal Medicine

## 2017-04-29 DIAGNOSIS — R600 Localized edema: Secondary | ICD-10-CM | POA: Diagnosis not present

## 2017-04-30 ENCOUNTER — Encounter (HOSPITAL_COMMUNITY)
Admission: RE | Admit: 2017-04-30 | Discharge: 2017-04-30 | Disposition: A | Discharge status: Home / Self Care | Payer: Medicare Other | Source: Skilled Nursing Facility | Attending: *Deleted | Admitting: *Deleted

## 2017-04-30 LAB — BASIC METABOLIC PANEL
Anion gap: 6 (ref 5–15)
BUN: 36 mg/dL — ABNORMAL HIGH (ref 6–20)
CHLORIDE: 98 mmol/L — AB (ref 101–111)
CO2: 36 mmol/L — ABNORMAL HIGH (ref 22–32)
Calcium: 8.6 mg/dL — ABNORMAL LOW (ref 8.9–10.3)
Creatinine, Ser: 1.21 mg/dL — ABNORMAL HIGH (ref 0.44–1.00)
GFR calc non Af Amer: 39 mL/min — ABNORMAL LOW (ref >60)
GFR, EST AFRICAN AMERICAN: 46 mL/min — AB (ref >60)
Glucose, Bld: 95 mg/dL (ref 65–99)
Potassium: 4.2 mmol/L (ref 3.5–5.1)
Sodium: 140 mmol/L (ref 135–145)

## 2017-05-02 ENCOUNTER — Other Ambulatory Visit (HOSPITAL_COMMUNITY)
Admission: RE | Admit: 2017-05-02 | Discharge: 2017-05-02 | Disposition: A | Discharge status: Home / Self Care | Payer: Medicare Other | Source: Ambulatory Visit | Attending: Internal Medicine | Admitting: Internal Medicine

## 2017-05-02 DIAGNOSIS — I471 Supraventricular tachycardia: Secondary | ICD-10-CM | POA: Diagnosis not present

## 2017-05-02 DIAGNOSIS — J690 Pneumonitis due to inhalation of food and vomit: Secondary | ICD-10-CM | POA: Diagnosis not present

## 2017-05-02 DIAGNOSIS — K56609 Unspecified intestinal obstruction, unspecified as to partial versus complete obstruction: Secondary | ICD-10-CM | POA: Diagnosis not present

## 2017-05-02 DIAGNOSIS — S0120XD Unspecified open wound of nose, subsequent encounter: Secondary | ICD-10-CM | POA: Diagnosis not present

## 2017-05-02 DIAGNOSIS — E871 Hypo-osmolality and hyponatremia: Secondary | ICD-10-CM | POA: Diagnosis not present

## 2017-05-02 DIAGNOSIS — I110 Hypertensive heart disease with heart failure: Secondary | ICD-10-CM | POA: Diagnosis not present

## 2017-05-02 DIAGNOSIS — J449 Chronic obstructive pulmonary disease, unspecified: Secondary | ICD-10-CM | POA: Diagnosis not present

## 2017-05-02 DIAGNOSIS — E039 Hypothyroidism, unspecified: Secondary | ICD-10-CM | POA: Diagnosis not present

## 2017-05-02 DIAGNOSIS — Z7901 Long term (current) use of anticoagulants: Secondary | ICD-10-CM | POA: Diagnosis not present

## 2017-05-02 DIAGNOSIS — I480 Paroxysmal atrial fibrillation: Secondary | ICD-10-CM | POA: Diagnosis not present

## 2017-05-02 DIAGNOSIS — Z87891 Personal history of nicotine dependence: Secondary | ICD-10-CM | POA: Diagnosis not present

## 2017-05-02 DIAGNOSIS — I5032 Chronic diastolic (congestive) heart failure: Secondary | ICD-10-CM | POA: Diagnosis not present

## 2017-05-02 DIAGNOSIS — Z9981 Dependence on supplemental oxygen: Secondary | ICD-10-CM | POA: Diagnosis not present

## 2017-05-02 DIAGNOSIS — E43 Unspecified severe protein-calorie malnutrition: Secondary | ICD-10-CM | POA: Diagnosis not present

## 2017-05-02 DIAGNOSIS — J9612 Chronic respiratory failure with hypercapnia: Secondary | ICD-10-CM | POA: Diagnosis not present

## 2017-05-02 LAB — BASIC METABOLIC PANEL
ANION GAP: 10 (ref 5–15)
BUN: 33 mg/dL — ABNORMAL HIGH (ref 6–20)
CALCIUM: 9.3 mg/dL (ref 8.9–10.3)
CO2: 32 mmol/L (ref 22–32)
CREATININE: 1.19 mg/dL — AB (ref 0.44–1.00)
Chloride: 101 mmol/L (ref 101–111)
GFR calc non Af Amer: 40 mL/min — ABNORMAL LOW (ref >60)
GFR, EST AFRICAN AMERICAN: 47 mL/min — AB (ref >60)
Glucose, Bld: 87 mg/dL (ref 65–99)
POTASSIUM: 4.3 mmol/L (ref 3.5–5.1)
SODIUM: 143 mmol/L (ref 135–145)

## 2017-05-03 ENCOUNTER — Ambulatory Visit: Payer: Self-pay | Admitting: Internal Medicine

## 2017-05-03 DIAGNOSIS — E871 Hypo-osmolality and hyponatremia: Secondary | ICD-10-CM | POA: Diagnosis not present

## 2017-05-03 DIAGNOSIS — I471 Supraventricular tachycardia: Secondary | ICD-10-CM | POA: Diagnosis not present

## 2017-05-03 DIAGNOSIS — I480 Paroxysmal atrial fibrillation: Secondary | ICD-10-CM | POA: Diagnosis not present

## 2017-05-03 DIAGNOSIS — Z9981 Dependence on supplemental oxygen: Secondary | ICD-10-CM | POA: Diagnosis not present

## 2017-05-03 DIAGNOSIS — K56609 Unspecified intestinal obstruction, unspecified as to partial versus complete obstruction: Secondary | ICD-10-CM | POA: Diagnosis not present

## 2017-05-03 DIAGNOSIS — I5032 Chronic diastolic (congestive) heart failure: Secondary | ICD-10-CM | POA: Diagnosis not present

## 2017-05-03 DIAGNOSIS — Z7901 Long term (current) use of anticoagulants: Secondary | ICD-10-CM | POA: Diagnosis not present

## 2017-05-03 DIAGNOSIS — J449 Chronic obstructive pulmonary disease, unspecified: Secondary | ICD-10-CM | POA: Diagnosis not present

## 2017-05-03 DIAGNOSIS — E43 Unspecified severe protein-calorie malnutrition: Secondary | ICD-10-CM | POA: Diagnosis not present

## 2017-05-03 DIAGNOSIS — I110 Hypertensive heart disease with heart failure: Secondary | ICD-10-CM | POA: Diagnosis not present

## 2017-05-03 DIAGNOSIS — J9612 Chronic respiratory failure with hypercapnia: Secondary | ICD-10-CM | POA: Diagnosis not present

## 2017-05-03 DIAGNOSIS — S0120XD Unspecified open wound of nose, subsequent encounter: Secondary | ICD-10-CM | POA: Diagnosis not present

## 2017-05-03 DIAGNOSIS — E039 Hypothyroidism, unspecified: Secondary | ICD-10-CM | POA: Diagnosis not present

## 2017-05-03 DIAGNOSIS — Z87891 Personal history of nicotine dependence: Secondary | ICD-10-CM | POA: Diagnosis not present

## 2017-05-03 NOTE — Progress Notes (Signed)
NO SHOW

## 2017-05-05 ENCOUNTER — Encounter: Payer: Self-pay | Admitting: Internal Medicine

## 2017-05-05 ENCOUNTER — Telehealth: Payer: Self-pay | Admitting: Internal Medicine

## 2017-05-05 ENCOUNTER — Ambulatory Visit (INDEPENDENT_AMBULATORY_CARE_PROVIDER_SITE_OTHER): Payer: Medicare Other | Admitting: Internal Medicine

## 2017-05-05 VITALS — BP 142/56 | HR 64 | Temp 97.7°F | Resp 16 | Ht 63.0 in | Wt 101.4 lb

## 2017-05-05 DIAGNOSIS — K56609 Unspecified intestinal obstruction, unspecified as to partial versus complete obstruction: Secondary | ICD-10-CM | POA: Diagnosis not present

## 2017-05-05 DIAGNOSIS — J9612 Chronic respiratory failure with hypercapnia: Secondary | ICD-10-CM | POA: Diagnosis not present

## 2017-05-05 DIAGNOSIS — I110 Hypertensive heart disease with heart failure: Secondary | ICD-10-CM | POA: Diagnosis not present

## 2017-05-05 DIAGNOSIS — E876 Hypokalemia: Secondary | ICD-10-CM

## 2017-05-05 DIAGNOSIS — I4891 Unspecified atrial fibrillation: Secondary | ICD-10-CM | POA: Diagnosis not present

## 2017-05-05 DIAGNOSIS — J690 Pneumonitis due to inhalation of food and vomit: Secondary | ICD-10-CM | POA: Diagnosis not present

## 2017-05-05 DIAGNOSIS — J449 Chronic obstructive pulmonary disease, unspecified: Secondary | ICD-10-CM | POA: Diagnosis not present

## 2017-05-05 DIAGNOSIS — Z87891 Personal history of nicotine dependence: Secondary | ICD-10-CM | POA: Diagnosis not present

## 2017-05-05 DIAGNOSIS — Z9981 Dependence on supplemental oxygen: Secondary | ICD-10-CM | POA: Diagnosis not present

## 2017-05-05 DIAGNOSIS — R7303 Prediabetes: Secondary | ICD-10-CM | POA: Diagnosis not present

## 2017-05-05 DIAGNOSIS — I10 Essential (primary) hypertension: Secondary | ICD-10-CM | POA: Diagnosis not present

## 2017-05-05 DIAGNOSIS — E871 Hypo-osmolality and hyponatremia: Secondary | ICD-10-CM | POA: Diagnosis not present

## 2017-05-05 DIAGNOSIS — S0120XD Unspecified open wound of nose, subsequent encounter: Secondary | ICD-10-CM | POA: Diagnosis not present

## 2017-05-05 DIAGNOSIS — I480 Paroxysmal atrial fibrillation: Secondary | ICD-10-CM | POA: Diagnosis not present

## 2017-05-05 DIAGNOSIS — I4892 Unspecified atrial flutter: Secondary | ICD-10-CM

## 2017-05-05 DIAGNOSIS — I5033 Acute on chronic diastolic (congestive) heart failure: Secondary | ICD-10-CM

## 2017-05-05 DIAGNOSIS — I5032 Chronic diastolic (congestive) heart failure: Secondary | ICD-10-CM | POA: Diagnosis not present

## 2017-05-05 DIAGNOSIS — J9611 Chronic respiratory failure with hypoxia: Secondary | ICD-10-CM

## 2017-05-05 DIAGNOSIS — Z7901 Long term (current) use of anticoagulants: Secondary | ICD-10-CM | POA: Diagnosis not present

## 2017-05-05 DIAGNOSIS — I471 Supraventricular tachycardia: Secondary | ICD-10-CM | POA: Diagnosis not present

## 2017-05-05 DIAGNOSIS — Z79899 Other long term (current) drug therapy: Secondary | ICD-10-CM

## 2017-05-05 DIAGNOSIS — E039 Hypothyroidism, unspecified: Secondary | ICD-10-CM | POA: Diagnosis not present

## 2017-05-05 DIAGNOSIS — E559 Vitamin D deficiency, unspecified: Secondary | ICD-10-CM

## 2017-05-05 DIAGNOSIS — E43 Unspecified severe protein-calorie malnutrition: Secondary | ICD-10-CM | POA: Diagnosis not present

## 2017-05-05 LAB — CBC WITH DIFFERENTIAL/PLATELET
BASOS ABS: 0 {cells}/uL (ref 0–200)
Basophils Relative: 0 %
EOS ABS: 237 {cells}/uL (ref 15–500)
Eosinophils Relative: 3 %
HEMATOCRIT: 36.5 % (ref 35.0–45.0)
HEMOGLOBIN: 11.6 g/dL — AB (ref 11.7–15.5)
LYMPHS ABS: 2133 {cells}/uL (ref 850–3900)
Lymphocytes Relative: 27 %
MCH: 30 pg (ref 27.0–33.0)
MCHC: 31.8 g/dL — AB (ref 32.0–36.0)
MCV: 94.3 fL (ref 80.0–100.0)
MPV: 11.1 fL (ref 7.5–12.5)
Monocytes Absolute: 711 cells/uL (ref 200–950)
Monocytes Relative: 9 %
NEUTROS PCT: 61 %
Neutro Abs: 4819 cells/uL (ref 1500–7800)
Platelets: 239 10*3/uL (ref 140–400)
RBC: 3.87 MIL/uL (ref 3.80–5.10)
RDW: 14.5 % (ref 11.0–15.0)
WBC: 7.9 10*3/uL (ref 3.8–10.8)

## 2017-05-05 NOTE — Telephone Encounter (Signed)
That will be on our desk
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
## 2017-05-05 NOTE — Patient Instructions (Signed)
Recommend Adult Low Dose Aspirin or  coated  Aspirin 81 mg daily  To reduce risk of Colon Cancer 20 %,  Skin Cancer 26 % ,  Melanoma 46%  and  Pancreatic cancer 60% +++++++++++++++++++++++++ Vitamin D goal  is between 70-100.  Please make sure that you are taking your Vitamin D as directed.  It is very important as a natural anti-inflammatory  helping hair, skin, and nails, as well as reducing stroke and heart attack risk.  It helps your bones and helps with mood. It also decreases numerous cancer risks so please take it as directed.  Low Vit D is associated with a 200-300% higher risk for CANCER  and 200-300% higher risk for HEART   ATTACK  &  STROKE.   ...................................... It is also associated with higher death rate at younger ages,  autoimmune diseases like Rheumatoid arthritis, Lupus, Multiple Sclerosis.    Also many other serious conditions, like depression, Alzheimer's Dementia, infertility, muscle aches, fatigue, fibromyalgia - just to name a few. ++++++++++++++++++++ Recommend the book "The END of DIETING" by Dr Joel Fuhrman  & the book "The END of DIABETES " by Dr Joel Fuhrman At [employer] - get book & Audio CD's    Being diabetic has a  300% increased risk for heart attack, stroke, cancer, and alzheimer- type vascular dementia. It is very important that you work harder with diet by avoiding all foods that are white. Avoid white rice (brown & wild rice is OK), white potatoes (sweetpotatoes in moderation is OK), White bread or wheat bread or anything made out of white flour like bagels, donuts, rolls, buns, biscuits, cakes, pastries, cookies, pizza crust, and pasta (made from white flour & egg whites) - vegetarian pasta or spinach or wheat pasta is OK. Multigrain breads like Arnold's or Pepperidge Farm, or multigrain sandwich thins or flatbreads.  Diet, exercise and weight loss can reverse and cure diabetes in the early stages.  Diet, exercise and weight loss is  very important in the control and prevention of complications of diabetes which affects every system in your body, ie. Brain - dementia/stroke, eyes - glaucoma/blindness, heart - heart attack/heart failure, kidneys - dialysis, stomach - gastric paralysis, intestines - malabsorption, nerves - severe painful neuritis, circulation - gangrene & loss of a leg(s), and finally cancer and Alzheimers.    I recommend avoid fried & greasy foods,  sweets/candy, white rice (brown or wild rice or Quinoa is OK), white potatoes (sweet potatoes are OK) - anything made from white flour - bagels, doughnuts, rolls, buns, biscuits,white and wheat breads, pizza crust and traditional pasta made of white flour & egg white(vegetarian pasta or spinach or wheat pasta is OK).  Multi-grain bread is OK - like multi-grain flat bread or sandwich thins. Avoid alcohol in excess. Exercise is also important.    Eat all the vegetables you want - avoid meat, especially red meat and dairy - especially cheese.  Cheese is the most concentrated form of trans-fats which is the worst thing to clog up our arteries. Veggie cheese is OK which can be found in the fresh produce section at [employer] or [employer] or [employer]  +++++++++++++++++++++ DASH Eating Plan  DASH stands for "Dietary Approaches to Stop Hypertension."   The DASH eating plan is a healthy eating plan that has been shown to reduce high blood pressure (hypertension). Additional health benefits may include reducing the risk of type 2 diabetes mellitus, heart disease, and stroke. The DASH eating plan may also   help with weight loss. WHAT DO I NEED TO KNOW ABOUT THE DASH EATING PLAN? For the DASH eating plan, you will follow these general guidelines:  Choose foods with a percent daily value for sodium of less than 5% (as listed on the food label).  Use salt-free seasonings or herbs instead of table salt or sea salt.  Check with your health care provider or pharmacist before  using salt substitutes.  Eat lower-sodium products, often labeled as "lower sodium" or "no salt added."  Eat fresh foods.  Eat more vegetables, fruits, and low-fat dairy products.  Choose whole grains. Look for the word "whole" as the first word in the ingredient list.  Choose fish   Limit sweets, desserts, sugars, and sugary drinks.  Choose heart-healthy fats.  Eat veggie cheese   Eat more home-cooked food and less restaurant, buffet, and fast food.  Limit fried foods.  Cook foods using methods other than frying.  Limit canned vegetables. If you do use them, rinse them well to decrease the sodium.  When eating at a restaurant, ask that your food be prepared with less salt, or no salt if possible.                      WHAT FOODS CAN I EAT? Read Dr Fara Olden Fuhrman's books on The End of Dieting & The End of Diabetes  Grains Whole grain or whole wheat bread. Brown rice. Whole grain or whole wheat pasta. Quinoa, bulgur, and whole grain cereals. Low-sodium cereals. Corn or whole wheat flour tortillas. Whole grain cornbread. Whole grain crackers. Low-sodium crackers.  Vegetables Fresh or frozen vegetables (raw, steamed, roasted, or grilled). Low-sodium or reduced-sodium tomato and vegetable juices. Low-sodium or reduced-sodium tomato sauce and paste. Low-sodium or reduced-sodium canned vegetables.   Fruits All fresh, canned (in natural juice), or frozen fruits.  Protein Products  All fish and seafood.  Dried beans, peas, or lentils. Unsalted nuts and seeds. Unsalted canned beans.  Dairy Low-fat dairy products, such as skim or 1% milk, 2% or reduced-fat cheeses, low-fat ricotta or cottage cheese, or plain low-fat yogurt. Low-sodium or reduced-sodium cheeses.  Fats and Oils Tub margarines without trans fats. Light or reduced-fat mayonnaise and salad dressings (reduced sodium). Avocado. Safflower, olive, or canola oils. Natural peanut or almond butter.  Other Unsalted popcorn  and pretzels. The items listed above may not be a complete list of recommended foods or beverages. Contact your dietitian for more options.  +++++++++++++++  WHAT FOODS ARE NOT RECOMMENDED? Grains/ White flour or wheat flour White bread. White pasta. White rice. Refined cornbread. Bagels and croissants. Crackers that contain trans fat.  Vegetables  Creamed or fried vegetables. Vegetables in a . Regular canned vegetables. Regular canned tomato sauce and paste. Regular tomato and vegetable juices.  Fruits Dried fruits. Canned fruit in light or heavy syrup. Fruit juice.  Meat and Other Protein Products Meat in general - RED meat & White meat.  Fatty cuts of meat. Ribs, chicken wings, all processed meats as bacon, sausage, bologna, salami, fatback, hot dogs, bratwurst and packaged luncheon meats.  Dairy Whole or 2% milk, cream, half-and-half, and cream cheese. Whole-fat or sweetened yogurt. Full-fat cheeses or blue cheese. Non-dairy creamers and whipped toppings. Processed cheese, cheese spreads, or cheese curds.  Condiments Onion and garlic salt, seasoned salt, table salt, and sea salt. Canned and packaged gravies. Worcestershire sauce. Tartar sauce. Barbecue sauce. Teriyaki sauce. Soy sauce, including reduced sodium. Steak sauce. Fish sauce. Oyster sauce. Cocktail  sauce. Horseradish. Ketchup and mustard. Meat flavorings and tenderizers. Bouillon cubes. Hot sauce. Tabasco sauce. Marinades. Taco seasonings. Relishes.  Fats and Oils Butter, stick margarine, lard, shortening and bacon fat. Coconut, palm kernel, or palm oils. Regular salad dressings.  Pickles and olives. Salted popcorn and pretzels.  The items listed above may not be a complete list of foods and beverages to avoid.

## 2017-05-05 NOTE — Telephone Encounter (Signed)
Daughter, Andrea [name] called and requested Rx for Oxygen for mother. Andrea [name] is going to visit in [place], she needs an rx faxed to [place] for Oxygen setup. Will be leaving Thursday August 16th. Please advise/  [facility] in [place], [place].   Fax [phone],

## 2017-05-06 ENCOUNTER — Telehealth: Payer: Self-pay | Admitting: Internal Medicine

## 2017-05-06 ENCOUNTER — Encounter: Payer: Self-pay | Admitting: *Deleted

## 2017-05-06 LAB — BASIC METABOLIC PANEL WITH GFR
BUN: 25 mg/dL (ref 7–25)
CALCIUM: 9.5 mg/dL (ref 8.6–10.4)
CO2: 31 mmol/L (ref 20–32)
CREATININE: 1.11 mg/dL — AB (ref 0.60–0.88)
Chloride: 96 mmol/L — ABNORMAL LOW (ref 98–110)
GFR, Est African American: 52 mL/min — ABNORMAL LOW (ref >=60)
GFR, Est Non African American: 45 mL/min — ABNORMAL LOW (ref >=60)
GLUCOSE: 91 mg/dL (ref 65–99)
Potassium: 4.5 mmol/L (ref 3.5–5.3)
Sodium: 139 mmol/L (ref 135–146)

## 2017-05-06 LAB — MAGNESIUM: Magnesium: 1.7 mg/dL (ref 1.5–2.5)

## 2017-05-06 NOTE — Telephone Encounter (Signed)
lvm for [facility], and faxed rx, demographics, note, and insurance card for O2 order while in [place]. Mailed hard copy of rx to patient.

## 2017-05-09 DIAGNOSIS — S0120XD Unspecified open wound of nose, subsequent encounter: Secondary | ICD-10-CM | POA: Diagnosis not present

## 2017-05-09 DIAGNOSIS — E039 Hypothyroidism, unspecified: Secondary | ICD-10-CM | POA: Diagnosis not present

## 2017-05-09 DIAGNOSIS — J9612 Chronic respiratory failure with hypercapnia: Secondary | ICD-10-CM | POA: Diagnosis not present

## 2017-05-09 DIAGNOSIS — K56609 Unspecified intestinal obstruction, unspecified as to partial versus complete obstruction: Secondary | ICD-10-CM | POA: Diagnosis not present

## 2017-05-09 DIAGNOSIS — Z87891 Personal history of nicotine dependence: Secondary | ICD-10-CM | POA: Diagnosis not present

## 2017-05-09 DIAGNOSIS — I480 Paroxysmal atrial fibrillation: Secondary | ICD-10-CM | POA: Diagnosis not present

## 2017-05-09 DIAGNOSIS — I110 Hypertensive heart disease with heart failure: Secondary | ICD-10-CM | POA: Diagnosis not present

## 2017-05-09 DIAGNOSIS — E43 Unspecified severe protein-calorie malnutrition: Secondary | ICD-10-CM | POA: Diagnosis not present

## 2017-05-09 DIAGNOSIS — Z7901 Long term (current) use of anticoagulants: Secondary | ICD-10-CM | POA: Diagnosis not present

## 2017-05-09 DIAGNOSIS — I5032 Chronic diastolic (congestive) heart failure: Secondary | ICD-10-CM | POA: Diagnosis not present

## 2017-05-09 DIAGNOSIS — Z9981 Dependence on supplemental oxygen: Secondary | ICD-10-CM | POA: Diagnosis not present

## 2017-05-09 DIAGNOSIS — E871 Hypo-osmolality and hyponatremia: Secondary | ICD-10-CM | POA: Diagnosis not present

## 2017-05-09 DIAGNOSIS — J449 Chronic obstructive pulmonary disease, unspecified: Secondary | ICD-10-CM | POA: Diagnosis not present

## 2017-05-09 DIAGNOSIS — I471 Supraventricular tachycardia: Secondary | ICD-10-CM | POA: Diagnosis not present

## 2017-05-09 NOTE — Telephone Encounter (Signed)
error

## 2017-05-10 DIAGNOSIS — I5032 Chronic diastolic (congestive) heart failure: Secondary | ICD-10-CM | POA: Diagnosis not present

## 2017-05-10 DIAGNOSIS — I480 Paroxysmal atrial fibrillation: Secondary | ICD-10-CM | POA: Diagnosis not present

## 2017-05-10 DIAGNOSIS — S0120XD Unspecified open wound of nose, subsequent encounter: Secondary | ICD-10-CM | POA: Diagnosis not present

## 2017-05-10 DIAGNOSIS — E039 Hypothyroidism, unspecified: Secondary | ICD-10-CM | POA: Diagnosis not present

## 2017-05-10 DIAGNOSIS — Z7901 Long term (current) use of anticoagulants: Secondary | ICD-10-CM | POA: Diagnosis not present

## 2017-05-10 DIAGNOSIS — E871 Hypo-osmolality and hyponatremia: Secondary | ICD-10-CM | POA: Diagnosis not present

## 2017-05-10 DIAGNOSIS — Z9981 Dependence on supplemental oxygen: Secondary | ICD-10-CM | POA: Diagnosis not present

## 2017-05-10 DIAGNOSIS — E43 Unspecified severe protein-calorie malnutrition: Secondary | ICD-10-CM | POA: Diagnosis not present

## 2017-05-10 DIAGNOSIS — K56609 Unspecified intestinal obstruction, unspecified as to partial versus complete obstruction: Secondary | ICD-10-CM | POA: Diagnosis not present

## 2017-05-10 DIAGNOSIS — J9612 Chronic respiratory failure with hypercapnia: Secondary | ICD-10-CM | POA: Diagnosis not present

## 2017-05-10 DIAGNOSIS — I110 Hypertensive heart disease with heart failure: Secondary | ICD-10-CM | POA: Diagnosis not present

## 2017-05-10 DIAGNOSIS — Z87891 Personal history of nicotine dependence: Secondary | ICD-10-CM | POA: Diagnosis not present

## 2017-05-10 DIAGNOSIS — I471 Supraventricular tachycardia: Secondary | ICD-10-CM | POA: Diagnosis not present

## 2017-05-10 DIAGNOSIS — J449 Chronic obstructive pulmonary disease, unspecified: Secondary | ICD-10-CM | POA: Diagnosis not present

## 2017-05-11 DIAGNOSIS — E43 Unspecified severe protein-calorie malnutrition: Secondary | ICD-10-CM | POA: Diagnosis not present

## 2017-05-11 DIAGNOSIS — I110 Hypertensive heart disease with heart failure: Secondary | ICD-10-CM | POA: Diagnosis not present

## 2017-05-11 DIAGNOSIS — I480 Paroxysmal atrial fibrillation: Secondary | ICD-10-CM | POA: Diagnosis not present

## 2017-05-11 DIAGNOSIS — E871 Hypo-osmolality and hyponatremia: Secondary | ICD-10-CM | POA: Diagnosis not present

## 2017-05-11 DIAGNOSIS — K56609 Unspecified intestinal obstruction, unspecified as to partial versus complete obstruction: Secondary | ICD-10-CM | POA: Diagnosis not present

## 2017-05-11 DIAGNOSIS — Z87891 Personal history of nicotine dependence: Secondary | ICD-10-CM | POA: Diagnosis not present

## 2017-05-11 DIAGNOSIS — E039 Hypothyroidism, unspecified: Secondary | ICD-10-CM | POA: Diagnosis not present

## 2017-05-11 DIAGNOSIS — I471 Supraventricular tachycardia: Secondary | ICD-10-CM | POA: Diagnosis not present

## 2017-05-11 DIAGNOSIS — J449 Chronic obstructive pulmonary disease, unspecified: Secondary | ICD-10-CM | POA: Diagnosis not present

## 2017-05-11 DIAGNOSIS — I5032 Chronic diastolic (congestive) heart failure: Secondary | ICD-10-CM | POA: Diagnosis not present

## 2017-05-11 DIAGNOSIS — J9612 Chronic respiratory failure with hypercapnia: Secondary | ICD-10-CM | POA: Diagnosis not present

## 2017-05-11 DIAGNOSIS — S0120XD Unspecified open wound of nose, subsequent encounter: Secondary | ICD-10-CM | POA: Diagnosis not present

## 2017-05-11 DIAGNOSIS — Z9981 Dependence on supplemental oxygen: Secondary | ICD-10-CM | POA: Diagnosis not present

## 2017-05-11 DIAGNOSIS — Z7901 Long term (current) use of anticoagulants: Secondary | ICD-10-CM | POA: Diagnosis not present

## 2017-05-12 ENCOUNTER — Other Ambulatory Visit: Payer: Self-pay | Admitting: Internal Medicine

## 2017-05-16 ENCOUNTER — Ambulatory Visit: Payer: Self-pay | Admitting: Physician Assistant

## 2017-05-17 ENCOUNTER — Encounter: Payer: Self-pay | Admitting: Cardiology

## 2017-05-17 ENCOUNTER — Ambulatory Visit (INDEPENDENT_AMBULATORY_CARE_PROVIDER_SITE_OTHER): Payer: Medicare Other | Admitting: Cardiology

## 2017-05-17 VITALS — BP 158/60 | HR 61 | Ht 63.0 in | Wt 102.0 lb

## 2017-05-17 DIAGNOSIS — I480 Paroxysmal atrial fibrillation: Secondary | ICD-10-CM | POA: Diagnosis not present

## 2017-05-17 DIAGNOSIS — I5032 Chronic diastolic (congestive) heart failure: Secondary | ICD-10-CM

## 2017-05-17 DIAGNOSIS — I10 Essential (primary) hypertension: Secondary | ICD-10-CM

## 2017-05-17 MED ORDER — DILTIAZEM HCL ER COATED BEADS 360 MG PO CP24: 360.0000 mg | ORAL_CAPSULE | Freq: Every day | ORAL | 3 refills | Status: DC

## 2017-05-17 NOTE — Patient Instructions (Signed)
Medication Instructions:   STOP DILTIAZEM WHEN CURRENT BOTTLE EMPTY  START DILTIAZEM CD 360 MG ONCE DAILY WHEN OTHER DILTIAZEM COMPLETE  Lab work: Your physician recommends that you return for lab work in: [place]:  Your physician wants you to follow-up in: [place] will receive a reminder letter in the mail two months in advance. If you don't receive a letter, please call our office to schedule the follow-up appointment.   If you need a refill on your cardiac medications before your next appointment, please call your pharmacy.
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
## 2017-05-24 DIAGNOSIS — I480 Paroxysmal atrial fibrillation: Secondary | ICD-10-CM | POA: Diagnosis not present

## 2017-05-24 LAB — BASIC METABOLIC PANEL
BUN / CREAT RATIO: 19 (ref 12–28)
BUN: 19 mg/dL (ref 8–27)
CALCIUM: 9.3 mg/dL (ref 8.7–10.3)
CO2: 31 mmol/L — ABNORMAL HIGH (ref 20–29)
Chloride: 93 mmol/L — ABNORMAL LOW (ref 96–106)
Creatinine, Ser: 1.02 mg/dL — ABNORMAL HIGH (ref 0.57–1.00)
GFR calc Af Amer: 58 mL/min/{1.73_m2} — ABNORMAL LOW (ref >59)
GFR, EST NON AFRICAN AMERICAN: 50 mL/min/{1.73_m2} — AB (ref >59)
Glucose: 97 mg/dL (ref 65–99)
POTASSIUM: 4.5 mmol/L (ref 3.5–5.2)
Sodium: 138 mmol/L (ref 134–144)

## 2017-05-26 ENCOUNTER — Telehealth: Payer: Self-pay | Admitting: Cardiology

## 2017-05-26 DIAGNOSIS — I480 Paroxysmal atrial fibrillation: Secondary | ICD-10-CM

## 2017-05-26 MED ORDER — APIXABAN 2.5 MG PO TABS: 2.5000 mg | ORAL_TABLET | Freq: Two times a day (BID) | ORAL | 3 refills | Status: DC

## 2017-05-26 MED ORDER — METOPROLOL TARTRATE 37.5 MG PO TABS: 37.5000 mg | ORAL_TABLET | Freq: Two times a day (BID) | ORAL | 3 refills | Status: DC

## 2017-05-26 MED ORDER — DILTIAZEM HCL ER COATED BEADS 360 MG PO CP24: 360.0000 mg | ORAL_CAPSULE | Freq: Every day | ORAL | 3 refills | Status: DC

## 2017-05-26 NOTE — Telephone Encounter (Signed)
New message      *STAT* If patient is at the pharmacy, call can be transferred to refill team.   1. Which medications need to be refilled? (please list name of each medication and dose if known)   apixaban (ELIQUIS) 2.5 MG TABS tablet Take 1 tablet (2.5 mg total) by mouth 2 (two) times daily.   diltiazem (CARDIZEM CD) 360 MG 24 hr capsule Take 1 capsule (360 mg total) by mouth daily.   metoprolol tartrate 37.5 MG TABS Take 37.5 mg by mouth 2 (two) times daily.     2. Which pharmacy/location (including street and city if local pharmacy) is medication to be sent to?[facility]  3. Do they need a 30 day or 90 day supply?  [place]

## 2017-05-26 NOTE — Telephone Encounter (Signed)
Refill sent to the pharmacy electronically.  

## 2017-05-31 ENCOUNTER — Other Ambulatory Visit: Payer: Self-pay | Admitting: Cardiology

## 2017-05-31 ENCOUNTER — Telehealth: Payer: Self-pay | Admitting: Cardiology

## 2017-05-31 MED ORDER — DILTIAZEM HCL 90 MG PO TABS: 90.0000 mg | ORAL_TABLET | Freq: Four times a day (QID) | ORAL | 3 refills | Status: DC

## 2017-05-31 MED ORDER — METOPROLOL TARTRATE 37.5 MG PO TABS
37.5000 mg | ORAL_TABLET | Freq: Two times a day (BID) | ORAL | 3 refills | Status: DC
Start: 2017-05-31 — End: 2017-09-21

## 2017-05-31 NOTE — Telephone Encounter (Signed)
Spoke with pt, she reports her average heart rate runs in the 70's. She reports yesterday she felt bad all day and feels like she tolerates the short acting diltiazem better. She wants to go back to diltiazem 90 mg every 6 hours. Will make dr Stanford Breed aware

## 2017-05-31 NOTE — Telephone Encounter (Signed)
Medication Detail    Disp Refills Start End   Metoprolol Tartrate 37.5 MG TABS 180 tablet 3 05/31/2017    Sig - Route: Take 37.5 mg by mouth 2 (two) times daily. - Oral   Sent to pharmacy as: Metoprolol Tartrate 37.5 MG Tab   E-Prescribing Status: Receipt confirmed by pharmacy (05/31/2017 12:38 PM EDT)   Pharmacy   [place], Lily

## 2017-05-31 NOTE — Telephone Encounter (Signed)
Spoke with pt, Aware of dr Jacalyn Lefevre recommendations. New script sent to the pharmacy

## 2017-05-31 NOTE — Telephone Encounter (Signed)
Ok to [employer] cd and resume cardizem 90 mg q [place]

## 2017-05-31 NOTE — Telephone Encounter (Signed)
Pt c/o medication issue:  1. Name of Medication: diltiazen   2. How are you currently taking this medication (dosage and times per day)? 380m 1x day  3. Are you having a reaction (difficulty breathing--STAT)? no  4. What is your medication issue? Pt verbalized her 103/51  Hr 149  She wants to take it 4x a day an not once a day

## 2017-05-31 NOTE — Telephone Encounter (Signed)
New message    Andrea [name] from [facility] is calling.  Pt c/o medication issue:  1. Name of Medication: diltaizem   2. How are you currently taking this medication (dosage and times per day)? 90 mg  3. Are you having a reaction (difficulty breathing--STAT)? no  4. What is your medication issue? Last month prescription was different, he is calling to confirm the dose they just received.

## 2017-05-31 NOTE — Telephone Encounter (Signed)
Reviewed Dr Stanford Breed and Arna Snipe notes (see other note from today regarding this change). Called pharmacy to communicate recommendations/rationale for change. Spoke to [place] who voiced understanding and thanks for call.

## 2017-05-31 NOTE — Telephone Encounter (Signed)
New Message      *STAT* If patient is at the pharmacy, call can be transferred to refill team.   1. Which medications need to be refilled? (please list name of each medication and dose if known)  Metoprolol Tartrate 37.5 MG TABS Take 37.5 mg by mouth 2 (two) times daily.     2. Which pharmacy/location (including street and city if local pharmacy) is medication to be sent to? [facility]   3. Do they need a 30 day or 90 day supply? [facility]

## 2017-06-02 ENCOUNTER — Telehealth: Payer: Self-pay | Admitting: Cardiology

## 2017-06-02 NOTE — Telephone Encounter (Signed)
Please call,pt says the Diltiazem is making her dizzy.

## 2017-06-02 NOTE — Telephone Encounter (Signed)
Spoke with pt, she has changed back to the diltiazem 90 mg qid. She reports that it is making her feel faint. Her bp is 114 and she reports her heart rate is 49 to 50 and her next dose of diltiazem is due at 6 pm. Advised the patient to check her heart rate prior to taking any diltiazem and if it is below 60 not to take it for tonight. Will discuss with dr Stanford Breed tomorrow and call her back. Pt agreed with this plan.

## 2017-06-03 MED ORDER — DILTIAZEM HCL 60 MG PO TABS: 60.0000 mg | ORAL_TABLET | Freq: Four times a day (QID) | ORAL | 6 refills | Status: DC

## 2017-06-03 NOTE — Telephone Encounter (Signed)
Spoke with pt, per dr Stanford Breed she will decrease the dose of diltiazem to 60 mg qid. She will continue to watch her bp and pulse and call with problems. Follow up scheduled with APP.

## 2017-06-03 NOTE — Telephone Encounter (Signed)
Spoke with pt, she did not take any more diltiazem last night. This morning her bp was 160/84 with pulse in the 70"s, she has taken both the diltiazem and metoprolol. Will discuss with dr Stanford Breed

## 2017-06-05 DIAGNOSIS — J449 Chronic obstructive pulmonary disease, unspecified: Secondary | ICD-10-CM | POA: Diagnosis not present

## 2017-06-09 ENCOUNTER — Ambulatory Visit (INDEPENDENT_AMBULATORY_CARE_PROVIDER_SITE_OTHER): Payer: Medicare Other | Admitting: Physician Assistant

## 2017-06-09 VITALS — BP 138/60 | HR 65 | Ht 63.0 in | Wt 101.0 lb

## 2017-06-09 DIAGNOSIS — I480 Paroxysmal atrial fibrillation: Secondary | ICD-10-CM

## 2017-06-09 DIAGNOSIS — E441 Mild protein-calorie malnutrition: Secondary | ICD-10-CM | POA: Diagnosis not present

## 2017-06-09 DIAGNOSIS — I351 Nonrheumatic aortic (valve) insufficiency: Secondary | ICD-10-CM | POA: Diagnosis not present

## 2017-06-09 DIAGNOSIS — I10 Essential (primary) hypertension: Secondary | ICD-10-CM | POA: Diagnosis not present

## 2017-06-09 NOTE — Patient Instructions (Addendum)
Medication Instructions:   STOP potassium chloride  Labwork:   none  Testing/Procedures:  none  Follow-Up:  With Dr. Stanford Breed in 3 months  You may drink Ensure as needed for improvement of nutrition. One bottle a day, in addition to your regular food and fluid intake, is recommended.  If you need a refill on your cardiac medications before your next appointment, please call your pharmacy.

## 2017-06-09 NOTE — Progress Notes (Signed)
Cardiology Office Note    Date:  06/11/2017   ID:  Andrea [name], DOB 12/22/1928, MRN [id number]  PCP:  Unk Pinto, MD  Cardiologist:  Dr Stanford Breed   Chief Complaint  Patient presents with  . Follow-up    seen for Dr. Stanford Breed    History of Present Illness:  Andrea [name] is a 83 y.o. female with PMH of HTN, palpitation, AI and PAF on eliquis. Myoview October 2009 showed EF 73% and normal perfusion. CardioNet in February 2014 showed sinus rhythm, PACs and PVCs with occasional junctional escape beats and pauses greater than 3 seconds. Cardizem was discontinued and the Norvasc added. Her monitored in January 2017 shows sinus rhythm with PACs and brief PAT. Echocardiogram in July 2018 shows normal LV function, mild AI, mild MR, moderate tricuspid regurgitation, mild-to-moderately elevated pulmonary pressure. Venous Dopplers in August 2018 showed no DVT. Patient recently had small bowel obstruction and developed SVT/atrial fibrillation and started on and accommodation. He was converted to sinus rhythm with metoprolol and Cardizem. She was placed on eliquis. Since her last visit in August, she has contacted the cardiology service on 05/31/2014 complaining of feeling bad on long-acting diltiazem, he has since been transitioned to short-acting diltiazem 90 mg 4 times a day. She contacted the cardiology office again on 9/6 complaining of presyncope and dizziness.  Patient presents today for cardiology office visit. She brought her blood pressure diary. Ever since her short-acting diltiazem has been decreased down to 60 mg 4 times a day, her dizziness has improved. She continued have weakness. She is very cachectic. Her weight has not significantly changed. I encouraged her to increase activity. She is worried about drinking ensure which might increased her cholesterol, however at this point I am more worried about her losing more weight and deconditioning. She has not had any significant abdominal  discomfort. She says she is largely sedentary at this point. She does not have significant lower extremity edema, orthopnea or PND. She has not been taking the potassium supplement listed on her medication, it appears this is only a temporary medication for 2 days. Latest labs shows stable potassium level, I have removed this medication from her medication list.   Past Medical History:  Diagnosis Date  . Aortic regurgitation   . Bronchiectasis ([place])   . COPD (chronic obstructive pulmonary disease) ([place])   . DJD (degenerative joint disease)   . HLD (hyperlipidemia)   . HTN (hypertension)   . Mild mitral regurgitation by prior echocardiogram   . Mild tricuspid regurgitation   . Palpitations    a. has known history of PAC's and PVC's. b. 09/2015: monitor showed sinus rhythm with occasional PAC's and a brief episode of PAT.  [name] Thyroid disease     Past Surgical History:  Procedure Laterality Date  . ABDOMINAL HYSTERECTOMY  1993  . CATARACT EXTRACTION, BILATERAL      Current Medications: Outpatient Medications Prior to Visit  Medication Sig Dispense Refill  . ALPRAZolam (XANAX) 0.25 MG tablet Take 0.25 mg by mouth as needed for anxiety.    [name] apixaban (ELIQUIS) 2.5 MG TABS tablet Take 1 tablet (2.5 mg total) by mouth 2 (two) times daily. 180 tablet 3  . Ascorbic Acid (VITAMIN C) 500 MG tablet Take 500 mg by mouth daily.      . cholecalciferol (VITAMIN D) 1000 UNITS tablet Take 1,000 Units by mouth daily.     [name] diltiazem (CARDIZEM) 60 MG tablet Take 1 tablet (60 mg total) by  mouth 4 (four) times daily. 120 tablet 6  . feeding supplement, ENSURE ENLIVE, (ENSURE ENLIVE) LIQD Take 237 mLs by mouth 2 (two) times daily between meals. 237 mL 12  . furosemide (LASIX) 20 MG tablet Take 20 mg by mouth as needed. For 2 days stop 04/29/2017     . ipratropium-albuterol (DUONEB) 0.5-2.5 (3) MG/3ML SOLN Take 3 mLs by nebulization every 6 (six) hours as needed. 360 mL 1  . levothyroxine (SYNTHROID,  LEVOTHROID) 50 MCG tablet TAKE 1 TABLET BY MOUTH DAILY OR AS DIRECTED 90 tablet 1  . Metoprolol Tartrate 37.5 MG TABS Take 37.5 mg by mouth 2 (two) times daily. 180 tablet 3  . mirtazapine (REMERON) 7.5 MG tablet Take 1 tablet (7.5 mg total) by mouth at bedtime.  0  . potassium chloride (K-DUR,KLOR-CON) 10 MEQ tablet Take 10 mEq by mouth daily. For 2 days stop 04/29/2017    . bisacodyl (DULCOLAX) 10 MG suppository Place 1 suppository (10 mg total) rectally every morning. 12 suppository 0  . omeprazole (PRILOSEC) 40 MG capsule TAKE ONE (1) CAPSULE EACH DAY (Patient not taking: Reported on 06/09/2017) 90 capsule 1   No facility-administered medications prior to visit.      Allergies:   Vancomycin and Sulfa antibiotics   Social History   Social History  . Marital status: Married    Spouse name: N/A  . Number of children: 5  . Years of education: N/A   Occupational History  . retired    Social History Main Topics  . Smoking status: Former Smoker    Packs/day: 1.00    Years: 20.00    Types: Cigarettes    Quit date: 09/27/1974  . Smokeless tobacco: Never Used  . Alcohol use No  . Drug use: No  . Sexual activity: No   Other Topics Concern  . None   Social History Narrative  . None     Family History:  The patient's family history includes Asthma in her brother; Cancer in her father; Diabetes in her brother, mother, and sister; Emphysema in her mother, sister, and sister; Heart attack in her sister; Rheumatic fever in her sister.   ROS:   Please see the history of present illness.    ROS All other systems reviewed and are negative.   PHYSICAL EXAM:   VS:  BP 138/60   Pulse 65   Ht _0  (1.6 m)   Wt 101 lb (45.8 kg)   BMI 17.89 kg/m    GEN: Cachectic, thin, frail, no acute distress  HEENT: normal  Neck: no JVD, carotid bruits, or masses Cardiac: RRR; no murmurs, rubs, or gallops,no edema  Respiratory:  clear to auscultation bilaterally, normal work of breathing GI:  soft, nontender, nondistended, + BS MS: no deformity or atrophy  Skin: warm and dry, no rash Neuro:  Alert and Oriented x 3, Strength and sensation are intact Psych: euthymic mood, full affect  Wt Readings from Last 3 Encounters:  06/09/17 101 lb (45.8 kg)  05/17/17 102 lb (46.3 kg)  05/05/17 101 lb 6.4 oz (46 kg)      Studies/Labs Reviewed:   EKG:  EKG is ordered today.  The ekg ordered today demonstrates Normal sinus rhythm with PACs, otherwise no ischemia.  Recent Labs: 04/04/2017: TSH 0.936 04/18/2017: ALT 12 04/25/2017: B Natriuretic Peptide 329.0 05/05/2017: Hemoglobin 11.6; Magnesium 1.7; Platelets 239 05/24/2017: BUN 19; Creatinine, Ser 1.02; Potassium 4.5; Sodium 138   Lipid Panel    Component Value Date/Time  CHOL 224 (H) 01/24/2017 1552   TRIG 72 01/24/2017 1552   HDL 106 01/24/2017 1552   CHOLHDL 2.1 01/24/2017 1552   VLDL 14 01/24/2017 1552   LDLCALC 104 (H) 01/24/2017 1552    Additional studies/ records that were reviewed today include:   Echo 04/03/2017 LV EF: 60% -   65%  Study Conclusions  - Left ventricle: The cavity size was normal. Systolic function was   normal. The estimated ejection fraction was in the range of 60%   to 65%. Wall motion was normal; there were no regional wall   motion abnormalities. - Aortic valve: Mildly calcified annulus. Trileaflet; normal   thickness leaflets. There was mild regurgitation. - Mitral valve: Mildly calcified annulus. There was mild   regurgitation. - Tricuspid valve: There was moderate regurgitation. - Pulmonary arteries: Systolic pressure was mildly to moderately   increased. PA peak pressure: 42 mm Hg (S).   ASSESSMENT:    1. AF (paroxysmal atrial fibrillation) ([place])   2. Essential hypertension   3. Nonrheumatic aortic valve insufficiency   4. Mild protein-calorie malnutrition ([place])      PLAN:  In order of problems listed above:  1. Paroxysmal atrial fibrillation on eliquis: Maintaining sinus  rhythm based on today's EKG. Ever since she lowers the diltiazem dose to 60 mg 3 times a day, her dizziness has significantly improved. We'll continue on current dose.  2. Hypertension: Blood pressure stable on current medication  3. Mild aortic insufficiency and mild mitral regurg: Continue monitoring, no symptom. Seen on last echocardiogram 7/80/2018  4. Mild malnutrition: Patient is a cachectic, weight is less than 60 kg. She is worried drinking Ensure will raise her lipid file, I am more worried about her small size and protein loss    Medication Adjustments/Labs and Tests Ordered: Current medicines are reviewed at length with the patient today.  Concerns regarding medicines are outlined above.  Medication changes, Labs and Tests ordered today are listed in the Patient Instructions below. Patient Instructions  Medication Instructions:   STOP potassium chloride  Labwork:   none  Testing/Procedures:  none  Follow-Up:  With Dr. Stanford Breed in 3 months  You may drink Ensure as needed for improvement of nutrition. One bottle a day, in addition to your regular food and fluid intake, is recommended.  If you need a refill on your cardiac medications before your next appointment, please call your pharmacy.      Hilbert Corrigan, [place]  06/11/2017 12:59 PM    [address], [place], [place]  [address] Phone: [phone]; Fax: [phone]

## 2017-06-11 ENCOUNTER — Encounter: Payer: Self-pay | Admitting: Physician Assistant

## 2017-06-19 ENCOUNTER — Encounter (HOSPITAL_COMMUNITY): Payer: Self-pay

## 2017-06-19 ENCOUNTER — Emergency Department (HOSPITAL_COMMUNITY)
Admission: EM | Admit: 2017-06-19 | Discharge: 2017-06-19 | Disposition: A | Discharge status: Home / Self Care | Payer: Medicare Other | Attending: Emergency Medicine | Admitting: Emergency Medicine

## 2017-06-19 ENCOUNTER — Ambulatory Visit (HOSPITAL_COMMUNITY)
Admission: EM | Admit: 2017-06-19 | Discharge: 2017-06-19 | Disposition: A | Discharge status: Home / Self Care | Payer: Medicare Other | Attending: Family Medicine | Admitting: Family Medicine

## 2017-06-19 ENCOUNTER — Encounter (HOSPITAL_COMMUNITY): Payer: Self-pay | Admitting: Family Medicine

## 2017-06-19 DIAGNOSIS — S81811D Laceration without foreign body, right lower leg, subsequent encounter: Secondary | ICD-10-CM | POA: Diagnosis not present

## 2017-06-19 DIAGNOSIS — W19XXXD Unspecified fall, subsequent encounter: Secondary | ICD-10-CM | POA: Insufficient documentation

## 2017-06-19 DIAGNOSIS — Z23 Encounter for immunization: Secondary | ICD-10-CM | POA: Diagnosis not present

## 2017-06-19 DIAGNOSIS — I110 Hypertensive heart disease with heart failure: Secondary | ICD-10-CM | POA: Diagnosis not present

## 2017-06-19 DIAGNOSIS — Z79899 Other long term (current) drug therapy: Secondary | ICD-10-CM | POA: Diagnosis not present

## 2017-06-19 DIAGNOSIS — E039 Hypothyroidism, unspecified: Secondary | ICD-10-CM | POA: Diagnosis not present

## 2017-06-19 DIAGNOSIS — Z87891 Personal history of nicotine dependence: Secondary | ICD-10-CM | POA: Insufficient documentation

## 2017-06-19 DIAGNOSIS — I5032 Chronic diastolic (congestive) heart failure: Secondary | ICD-10-CM | POA: Insufficient documentation

## 2017-06-19 DIAGNOSIS — S81811A Laceration without foreign body, right lower leg, initial encounter: Secondary | ICD-10-CM | POA: Diagnosis not present

## 2017-06-19 DIAGNOSIS — Z7901 Long term (current) use of anticoagulants: Secondary | ICD-10-CM | POA: Diagnosis not present

## 2017-06-19 DIAGNOSIS — J449 Chronic obstructive pulmonary disease, unspecified: Secondary | ICD-10-CM | POA: Insufficient documentation

## 2017-06-19 MED ORDER — TETANUS-DIPHTH-ACELL PERTUSSIS 5-2.5-18.5 LF-MCG/0.5 IM SUSP
0.5000 mL | Freq: Once | INTRAMUSCULAR | Status: AC
  Administered 2017-06-19: 0.5 mL via INTRAMUSCULAR
  Filled 2017-06-19: qty 0.5

## 2017-06-19 NOTE — ED Notes (Signed)
Dressing changed in triage and pressure dressing applied.

## 2017-06-19 NOTE — ED Notes (Signed)
Got into a antique car, tore shin, went to urgent care  Steri stripped and glued Home Started bleeding again To ER  On blood thinner

## 2017-06-19 NOTE — Discharge Instructions (Signed)
We got the wound edges as close together is recurred without tearing more skin.  Keep the dressing on until your recheck on Tuesday or Wednesday  Please call Dr. Melford Aase to find out when her last tetanus shot was.

## 2017-06-19 NOTE — ED Triage Notes (Addendum)
Patient was seen at urgent care for skin tear. States when patient got home wound began to bleed again. Pressure dressing applied to right lower leg. Patient denies pain. No new injury. Unknown last tetanus shot.  Patient taking Eliquis.

## 2017-06-19 NOTE — Discharge Instructions (Signed)
Keep leg elevated and follow up in 2 days for recheck

## 2017-06-19 NOTE — ED Provider Notes (Signed)
[facility]    CSN: [id number] Arrival date & time: 06/19/17  1600     History   Chief Complaint Chief Complaint  Patient presents with  . Extremity Laceration    HPI Andrea [name] is a 83 y.o. female.   Pt reports she lac to RLE onset 1530... EMT came out and wrapped her... Was told by them she "degloved" her skin  She slipped while getting out of a car  Pt is on Elequis daily  Last tetanus = w/in years.   A&O x4... NAD... Brought back on wheelchair      Past Medical History:  Diagnosis Date  . Aortic regurgitation   . Bronchiectasis ([place])   . COPD (chronic obstructive pulmonary disease) ([place])   . DJD (degenerative joint disease)   . HLD (hyperlipidemia)   . HTN (hypertension)   . Mild mitral regurgitation by prior echocardiogram   . Mild tricuspid regurgitation   . Palpitations    a. has known history of PAC's and PVC's. b. 09/2015: monitor showed sinus rhythm with occasional PAC's and a brief episode of PAT.  [name] Thyroid disease     Patient Active Problem List   Diagnosis Date Noted  . Anterior basement membrane dystrophy 04/27/2017  . Anterior corneal dystrophy 04/27/2017  . Protein-calorie malnutrition, severe 04/08/2017  . Aspiration pneumonitis ([place]) 04/03/2017  . Acute on chronic diastolic CHF (congestive heart failure) ([place]) 04/03/2017  . AF (paroxysmal atrial fibrillation) ([place]) 04/03/2017  . Atrial tachycardia ([place]) 04/02/2017  . Chronic respiratory failure with hypoxia ([place]) 03/31/2017  . Small bowel obstruction ([place]) 03/31/2017  . SBO (small bowel obstruction) ([place]) 03/30/2017  . Chronic diastolic CHF (congestive heart failure) ([place]) 12/27/2016  . Pulmonary hypertension due to COPD ([place]) 10/04/2016  . Bilateral pleural effusion 04/20/2016  . Hyponatremia 04/19/2016  . Renal insufficiency 04/19/2016  . Tachycardia 04/19/2016  . Skin cancer 09/25/2015  . Malnutrition ([place]) 09/25/2015  . Hypothyroidism 09/25/2015  .  Osteoporosis 09/25/2015  . Aortic insufficiency 09/04/2015  . Pseudophakia 07/31/2014  . Vitamin D deficiency 01/23/2014  . Medication management 01/23/2014  . Hyperlipidemia 08/10/2013  . Prediabetes 08/10/2013  . Bilateral dry eyes 09/08/2011  . Exposure keratopathy 09/08/2011  . DJD (degenerative joint disease)   . Mild mitral regurgitation by prior echocardiogram   . Essential hypertension 05/08/2010  . COPD with emphysema ([place]) 05/08/2010  . Lung nodules 05/08/2010    Past Surgical History:  Procedure Laterality Date  . ABDOMINAL HYSTERECTOMY  1993  . CATARACT EXTRACTION, BILATERAL      OB History    No data available       Home Medications    Prior to Admission medications   Medication Sig Start Date End Date Taking? Authorizing Provider  ALPRAZolam Duanne Moron) 0.25 MG tablet Take 0.25 mg by mouth as needed for anxiety.   Yes [provider]  apixaban (ELIQUIS) 2.5 MG TABS tablet Take 1 tablet (2.5 mg total) by mouth 2 (two) times daily. 05/26/17  Yes Lelon Perla, MD  Ascorbic Acid (VITAMIN C) 500 MG tablet Take 500 mg by mouth daily.     Yes [provider]  cholecalciferol (VITAMIN D) 1000 UNITS tablet Take 1,000 Units by mouth daily.    Yes [provider]  diltiazem (CARDIZEM) 60 MG tablet Take 1 tablet (60 mg total) by mouth 4 (four) times daily. 06/03/17  Yes Lelon Perla, MD  feeding supplement, ENSURE ENLIVE, (ENSURE ENLIVE) LIQD Take 237 mLs by mouth 2 (  two) times daily between meals. 04/12/17  Yes Rama, Venetia Maxon, MD  furosemide (LASIX) 20 MG tablet Take 20 mg by mouth as needed. For 2 days stop 04/29/2017    Yes [provider]  ipratropium-albuterol (DUONEB) 0.5-2.5 (3) MG/3ML SOLN Take 3 mLs by nebulization every 6 (six) hours as needed. 04/25/16  Yes Kathie Dike, MD  levothyroxine (SYNTHROID, LEVOTHROID) 50 MCG tablet TAKE 1 TABLET BY MOUTH DAILY OR AS DIRECTED 09/03/16  Yes Unk Pinto, MD  Metoprolol Tartrate  37.5 MG TABS Take 37.5 mg by mouth 2 (two) times daily. 05/31/17  Yes Lelon Perla, MD  mirtazapine (REMERON) 7.5 MG tablet Take 1 tablet (7.5 mg total) by mouth at bedtime. 04/12/17  Yes Rama, Venetia Maxon, MD    Family History Family History  Problem Relation Age of Onset  . Emphysema Mother   . Diabetes Mother   . Cancer Father   . Emphysema Sister   . Emphysema Sister   . Rheumatic fever Sister   . Heart attack Sister   . Diabetes Sister   . Diabetes Brother   . Asthma Brother        as a child    Social History Social History  Substance Use Topics  . Smoking status: Former Smoker    Packs/day: 1.00    Years: 20.00    Types: Cigarettes    Quit date: 09/27/1974  . Smokeless tobacco: Never Used  . Alcohol use No     Allergies   Vancomycin and Sulfa antibiotics   Review of Systems Review of Systems  Skin: Positive for wound.  All other systems reviewed and are negative.    Physical Exam Triage Vital Signs ED Triage Vitals  Enc Vitals Group     BP 06/19/17 1613 (!) 178/79     Pulse Rate 06/19/17 1613 64     Resp 06/19/17 1613 20     Temp 06/19/17 1613 98.1 F (36.7 C)     Temp Source 06/19/17 1613 Oral     SpO2 06/19/17 1613 94 %     Weight --      Height --      Head Circumference --      Peak Flow --      Pain Score 06/19/17 1615 5     Pain Loc --      Pain Edu? --      Excl. in [place]? --    No data found.   Updated Vital Signs BP (!) 178/79 (BP Location: Right Arm)   Pulse 64   Temp 98.1 F (36.7 C) (Oral)   Resp 20   SpO2 94%    Physical Exam  Constitutional: She is oriented to person, place, and time. She appears well-developed and well-nourished.  HENT:  Right Ear: External ear normal.  Left Ear: External ear normal.  Mouth/Throat: Oropharynx is clear and moist.  Eyes: Pupils are equal, round, and reactive to light. Conjunctivae are normal.  Neck: Normal range of motion. Neck supple.  Pulmonary/Chest: Effort normal.    Musculoskeletal: Normal range of motion.  Neurological: She is alert and oriented to person, place, and time.  Skin: Skin is warm.  30 cm vee shaped downward laceration involving the lower 1/2 of the right shin.  Psychiatric: She has a normal mood and affect.  Nursing note and vitals reviewed.    UC Treatments / Results  Labs (all labs ordered are listed, but only abnormal results are displayed) Labs Reviewed - No  data to display  EKG  EKG Interpretation None       Radiology No results found.  Procedures .Marland KitchenLaceration Repair Date/Time: 06/19/2017 4:54 PM Performed by: Robyn Haber Authorized by: Robyn Haber   Consent:    Consent obtained:  Verbal   Consent given by:  Patient   Risks discussed:  Infection and need for additional repair   Alternatives discussed:  No treatment Anesthesia (see MAR for exact dosages):    Anesthesia method:  Local infiltration   Local anesthetic:  Lidocaine 2% WITH epi Laceration details:    Location:  Leg   Leg location:  R lower leg   Length (cm):  30   Depth (mm):  5 Repair type:    Repair type:  Simple Pre-procedure details:    Preparation:  Patient was prepped and draped in usual sterile fashion Exploration:    Hemostasis achieved with:  Direct pressure   Wound extent: no areolar tissue violation noted, no fascia violation noted, no foreign bodies/material noted, no muscle damage noted, no nerve damage noted, no tendon damage noted, no underlying fracture noted and no vascular damage noted     Contaminated: no   Treatment:    Area cleansed with:  Betadine   Amount of cleaning:  Standard   Irrigation solution:  Sterile saline   Visualized foreign bodies/material removed: no   Skin repair:    Repair method:  Tissue adhesive and Steri-Strips Approximation:    Approximation:  Loose   [place] border: poorly aligned   Post-procedure details:    Dressing:  Bulky dressing   Patient tolerance of procedure:  Tolerated  well, no immediate complications   (including critical care time)  Medications Ordered in UC Medications - No data to display   Initial Impression / Assessment and Plan / UC Course  I have reviewed the triage vital signs and the nursing notes.  Pertinent labs & imaging results that were available during my care of the patient were reviewed by me and considered in my medical decision making (see chart for details).     Final Clinical Impressions(s) / UC Diagnoses   Final diagnoses:  Laceration of right lower extremity, initial encounter    New Prescriptions New Prescriptions   No medications on file     Controlled Substance Prescriptions [place] Controlled Substance Registry consulted? Not Applicable   Robyn Haber, MD 06/19/17 [phone]

## 2017-06-19 NOTE — ED Provider Notes (Signed)
[facility] DEPT Provider Note   CSN: [id number] Arrival date & time: 06/19/17  1815     History   Chief Complaint Chief Complaint  Patient presents with  . Laceration    HPI Andrea [name] is a 83 y.o. female.  Patient fell today and had a laceration to her right heel. It was fixed at an urgent care. And when she got home she was standing up and started bleeding again.   The history is provided by the patient.  Wound Check  This is a new problem. The current episode started 3 to 5 hours ago. The problem occurs constantly. The problem has been resolved. Pertinent negatives include no chest pain, no abdominal pain and no headaches. Nothing aggravates the symptoms. Nothing relieves the symptoms.    Past Medical History:  Diagnosis Date  . Aortic regurgitation   . Bronchiectasis ([place])   . COPD (chronic obstructive pulmonary disease) ([place])   . DJD (degenerative joint disease)   . HLD (hyperlipidemia)   . HTN (hypertension)   . Mild mitral regurgitation by prior echocardiogram   . Mild tricuspid regurgitation   . Palpitations    a. has known history of PAC's and PVC's. b. 09/2015: monitor showed sinus rhythm with occasional PAC's and a brief episode of PAT.  [name] Thyroid disease     Patient Active Problem List   Diagnosis Date Noted  . Anterior basement membrane dystrophy 04/27/2017  . Anterior corneal dystrophy 04/27/2017  . Protein-calorie malnutrition, severe 04/08/2017  . Aspiration pneumonitis ([place]) 04/03/2017  . Acute on chronic diastolic CHF (congestive heart failure) ([place]) 04/03/2017  . AF (paroxysmal atrial fibrillation) ([place]) 04/03/2017  . Atrial tachycardia ([place]) 04/02/2017  . Chronic respiratory failure with hypoxia ([place]) 03/31/2017  . Small bowel obstruction ([place]) 03/31/2017  . SBO (small bowel obstruction) ([place]) 03/30/2017  . Chronic diastolic CHF (congestive heart failure) ([place]) 12/27/2016  . Pulmonary hypertension due to COPD ([place]) 10/04/2016  .  Bilateral pleural effusion 04/20/2016  . Hyponatremia 04/19/2016  . Renal insufficiency 04/19/2016  . Tachycardia 04/19/2016  . Skin cancer 09/25/2015  . Malnutrition ([place]) 09/25/2015  . Hypothyroidism 09/25/2015  . Osteoporosis 09/25/2015  . Aortic insufficiency 09/04/2015  . Pseudophakia 07/31/2014  . Vitamin D deficiency 01/23/2014  . Medication management 01/23/2014  . Hyperlipidemia 08/10/2013  . Prediabetes 08/10/2013  . Bilateral dry eyes 09/08/2011  . Exposure keratopathy 09/08/2011  . DJD (degenerative joint disease)   . Mild mitral regurgitation by prior echocardiogram   . Essential hypertension 05/08/2010  . COPD with emphysema ([place]) 05/08/2010  . Lung nodules 05/08/2010    Past Surgical History:  Procedure Laterality Date  . ABDOMINAL HYSTERECTOMY  1993  . CATARACT EXTRACTION, BILATERAL      OB History    No data available       Home Medications    Prior to Admission medications   Medication Sig Start Date End Date Taking? Authorizing Provider  ALPRAZolam Duanne Moron) 0.25 MG tablet Take 0.25 mg by mouth as needed for anxiety.    [provider]  apixaban (ELIQUIS) 2.5 MG TABS tablet Take 1 tablet (2.5 mg total) by mouth 2 (two) times daily. 05/26/17   Lelon Perla, MD  Ascorbic Acid (VITAMIN C) 500 MG tablet Take 500 mg by mouth daily.      [provider]  cholecalciferol (VITAMIN D) 1000 UNITS tablet Take 1,000 Units by mouth daily.     [provider]  diltiazem (CARDIZEM) 60 MG tablet Take 1  tablet (60 mg total) by mouth 4 (four) times daily. 06/03/17   Lelon Perla, MD  feeding supplement, ENSURE ENLIVE, (ENSURE ENLIVE) LIQD Take 237 mLs by mouth 2 (two) times daily between meals. 04/12/17   Rama, Venetia Maxon, MD  furosemide (LASIX) 20 MG tablet Take 20 mg by mouth as needed. For 2 days stop 04/29/2017     [provider]  ipratropium-albuterol (DUONEB) 0.5-2.5 (3) MG/3ML SOLN Take 3 mLs by nebulization every 6 (six)  hours as needed. 04/25/16   Kathie Dike, MD  levothyroxine (SYNTHROID, LEVOTHROID) 50 MCG tablet TAKE 1 TABLET BY MOUTH DAILY OR AS DIRECTED 09/03/16   Unk Pinto, MD  Metoprolol Tartrate 37.5 MG TABS Take 37.5 mg by mouth 2 (two) times daily. 05/31/17   Lelon Perla, MD  mirtazapine (REMERON) 7.5 MG tablet Take 1 tablet (7.5 mg total) by mouth at bedtime. 04/12/17   Rama, Venetia Maxon, MD    Family History Family History  Problem Relation Age of Onset  . Emphysema Mother   . Diabetes Mother   . Cancer Father   . Emphysema Sister   . Emphysema Sister   . Rheumatic fever Sister   . Heart attack Sister   . Diabetes Sister   . Diabetes Brother   . Asthma Brother        as a child    Social History Social History  Substance Use Topics  . Smoking status: Former Smoker    Packs/day: 1.00    Years: 20.00    Types: Cigarettes    Quit date: 09/27/1974  . Smokeless tobacco: Never Used  . Alcohol use No     Allergies   Vancomycin and Sulfa antibiotics   Review of Systems Review of Systems  Constitutional: Negative for appetite change and fatigue.  HENT: Negative for congestion, ear discharge and sinus pressure.   Eyes: Negative for discharge.  Respiratory: Negative for cough.   Cardiovascular: Negative for chest pain.  Gastrointestinal: Negative for abdominal pain and diarrhea.  Genitourinary: Negative for frequency and hematuria.  Musculoskeletal: Negative for back pain.       Laceration right leg  Skin: Negative for rash.  Neurological: Negative for seizures and headaches.  Psychiatric/Behavioral: Negative for hallucinations.     Physical Exam Updated Vital Signs BP (!) 170/65   Pulse 65   Temp 97.6 F (36.4 C) (Oral)   Resp 18   Ht _0  (1.6 m)   Wt 45.8 kg (101 lb)   SpO2 99%   BMI 17.89 kg/m   Physical Exam  Constitutional: She is oriented to person, place, and time. She appears well-developed.  HENT:  Head: Normocephalic.  Eyes: Conjunctivae  are normal.  Neck: No tracheal deviation present.  Cardiovascular:  No murmur heard. Musculoskeletal: Normal range of motion.  Neurological: She is oriented to person, place, and time.  Skin: Skin is warm.  Patient has a laceration to her right leg that was closed with Steri-Strips. Bleeding is now controlled with a pressure dressing  Psychiatric: She has a normal mood and affect.     ED Treatments / Results  Labs (all labs ordered are listed, but only abnormal results are displayed) Labs Reviewed - No data to display  EKG  EKG Interpretation None       Radiology No results found.  Procedures Procedures (including critical care time)  Medications Ordered in ED Medications  Tdap (BOOSTRIX) injection 0.5 mL (0.5 mLs Intramuscular Given 06/19/17 1919)  Initial Impression / Assessment and Plan / ED Course  I have reviewed the triage vital signs and the nursing notes.  Pertinent labs & imaging results that were available during my care of the patient were reviewed by me and considered in my medical decision making (see chart for details).     Patient will follow-up with her doctor in 2 days for recheck. She will try to stay off her leg as much as possible until that  Final Clinical Impressions(s) / ED Diagnoses   Final diagnoses:  Laceration of right lower extremity, subsequent encounter    New Prescriptions New Prescriptions   No medications on file     Milton Ferguson, MD 06/19/17 1929

## 2017-06-19 NOTE — ED Triage Notes (Addendum)
Pt reports she lac to RLE onset 1530... EMT came out and wrapped her... Was told by them she "degloved" her skin  She slipped while getting out of a car  Pt is on Elequis daily  Last tetanus = w/in years.   A&O x4... NAD... Brought back on wheelchair

## 2017-06-20 ENCOUNTER — Telehealth: Payer: Self-pay | Admitting: Cardiology

## 2017-06-20 NOTE — Telephone Encounter (Signed)
New message      Pt c/o medication issue:  1. Name of Medication: eliquis  2. How are you currently taking this medication (dosage and times per day)? 2.46m 3. Are you having a reaction (difficulty breathing--STAT)?  no 4. What is your medication issue? Pt states that yesterday afternoon, she hurt her leg and it began bleeding really bad.  She went to the ER.  She did not take her eliquis last night or this am.  Calling to see when she should restart the medication.  Please call

## 2017-06-20 NOTE — Telephone Encounter (Signed)
Pt called. She had an accident yesterday where she cut open her leg, had skin tear along her shin.  She went to urgent care yesterday, they applied dressing and instructed her to return to urgent care tomorrow or Wednesday for a dressing check.   She was advised to hold her Eliquis and hasn't taken any since yesterday AM.  Pt seeking advice on when safe to resume this medication. She's concerned because she can't check under the wound dressing and doesn't know if there is still any drainage or bleeding.   Informed her I would seek advice from Dr. Stanford Breed.

## 2017-06-20 NOTE — Telephone Encounter (Signed)
Spoke with pt, Aware of dr crenshaw's recommendations.  °

## 2017-06-20 NOTE — Telephone Encounter (Signed)
Hold apixaban until wound not bleeding and reevaluated by urgent care; would hold at least 3 days Andrea [name]

## 2017-06-21 ENCOUNTER — Encounter: Payer: Self-pay | Admitting: Adult Health

## 2017-06-21 ENCOUNTER — Ambulatory Visit (INDEPENDENT_AMBULATORY_CARE_PROVIDER_SITE_OTHER): Payer: Medicare Other | Admitting: Adult Health

## 2017-06-21 VITALS — BP 130/74 | HR 76 | Temp 97.9°F | Resp 14 | Ht 63.0 in | Wt 100.0 lb

## 2017-06-21 DIAGNOSIS — S81811D Laceration without foreign body, right lower leg, subsequent encounter: Secondary | ICD-10-CM

## 2017-06-21 DIAGNOSIS — Z7901 Long term (current) use of anticoagulants: Secondary | ICD-10-CM

## 2017-06-21 NOTE — Progress Notes (Signed)
Assessment and Plan:  Andrea [name] was seen today for follow-up.  Diagnoses and all orders for this visit:  Laceration of lower extremity, right, subsequent encounter  Rewrapped in office today with nonadherent dressing/ABD/ACE wrap.   Continue to keep dry for next several days; will follow up to recheck in 2-3 days.   May apply bacitracin/triple antibiotic ointment, nonadherent dressing, then wrap with ACE wrap   Instructed to monitor for signs of infection, or call the office with any new/concerning changes.   Current use of long term anticoagulation  Encouraged her to restart elequis as prescribed.    Encouraged to follow up with compression stocking appointment to help prevent ankle edema, provided information on constipation and discussed use of OTC agents; instructed to call the office should she have 3+ days without a bowel movement, or if she experiences severe abdominal pain, nausea/vomiting, thin stools or liquid stools.   Over 20 minutes of exam, counseling, chart review, and critical decision making was performed.   Future Appointments Date Time Provider [place]  08/16/2017 2:30 PM Unk Pinto, MD [facility] None  09/22/2017 3:40 PM Lelon Perla, MD [facility]  10/05/2017 3:00 PM Vicie Mutters, PA-C [facility] None    ------------------------------------------------------------------------------------------------------------------   HPI 83 y.o.female presents accompanied by her daughter for follow up of a RLE laceration sustained 9/23 while getting out of a car. She was seen by urgent care who place steri strips, but then experienced bleeding of the area and followed up at the ER where they placed a pressure dressing and instructed for her to follow up today for a dressing change. Denies fevers/chills, pain except with pressure over wound.   Patient on elequis for atrial fibrillation. Was instructed by ER to stop medication for 2-3 days;  encouraged her to restart this. She also has chronic bilateral lower extremity edema for which she has been instructed to utilize compression hose in the past; reiterated this, and encouraged her and her daughter to visit the local specialty store.   The patient states she also continues to experience hard stools after being discharged with a bowel obstruction, but reports she is having bowel movements every day. Denies abdominal pain/N/V/melena/hematochezia.   Past Medical History:  Diagnosis Date  . Aortic regurgitation   . Bronchiectasis ([place])   . COPD (chronic obstructive pulmonary disease) ([place])   . DJD (degenerative joint disease)   . HLD (hyperlipidemia)   . HTN (hypertension)   . Mild mitral regurgitation by prior echocardiogram   . Mild tricuspid regurgitation   . Palpitations    a. has known history of PAC's and PVC's. b. 09/2015: monitor showed sinus rhythm with occasional PAC's and a brief episode of PAT.  Andrea [name] Thyroid disease      Allergies  Allergen Reactions  . Vancomycin     Worsening kidney function  . Sulfa Antibiotics Palpitations    Unknown    Current Outpatient Prescriptions on File Prior to Visit  Medication Sig  . ALPRAZolam (XANAX) 0.25 MG tablet Take 0.25 mg by mouth as needed for anxiety.  Andrea [name] apixaban (ELIQUIS) 2.5 MG TABS tablet Take 1 tablet (2.5 mg total) by mouth 2 (two) times daily.  . Ascorbic Acid (VITAMIN C) 500 MG tablet Take 500 mg by mouth daily.    . cholecalciferol (VITAMIN D) 1000 UNITS tablet Take 1,000 Units by mouth daily.   Andrea [name] diltiazem (CARDIZEM) 60 MG tablet Take 1 tablet (60 mg total) by mouth 4 (four) times daily.  . feeding supplement, ENSURE  ENLIVE, (ENSURE ENLIVE) LIQD Take 237 mLs by mouth 2 (two) times daily between meals.  . furosemide (LASIX) 20 MG tablet Take 20 mg by mouth as needed. For 2 days stop 04/29/2017   . ipratropium-albuterol (DUONEB) 0.5-2.5 (3) MG/3ML SOLN Take 3 mLs by nebulization every 6 (six) hours as needed.  Andrea [name]  levothyroxine (SYNTHROID, LEVOTHROID) 50 MCG tablet TAKE 1 TABLET BY MOUTH DAILY OR AS DIRECTED  . Metoprolol Tartrate 37.5 MG TABS Take 37.5 mg by mouth 2 (two) times daily.  . mirtazapine (REMERON) 7.5 MG tablet Take 1 tablet (7.5 mg total) by mouth at bedtime.   No current facility-administered medications on file prior to visit.     ROS: all negative except above.   Physical Exam:  BP 130/74   Pulse 76   Temp 97.9 F (36.6 C)   Resp 14   Ht _0  (1.6 m)   Wt 100 lb (45.4 kg)   SpO2 95%   BMI 17.71 kg/m   General Appearance: Well nourished, in no apparent distress. Respiratory: Respiratory effort normal, BS equal bilaterally without rales, rhonchi, wheezing or stridor.  Cardio: RRR with no MRGs. Brisk peripheral pulses bilateral nonpitting edema to ankles.  Abdomen: Soft, + BS.  Non tender, no guarding, rebound, hernias, masses. Lymphatics: Non tender without lymphadenopathy.  Musculoskeletal: Slow gait assisted by cane.  Skin: Warm, dry. Right anterior lower leg with a large U-shaped laceration; edges approximated by steri strips. Extensive ecchymosis to area and ankle below; no injection, not excessively warm to touch or other indicators of infection present. No active bleeding.  Neuro: Cranial nerves intact. Normal muscle tone, no cerebellar symptoms. Sensation intact.  Psych: Awake and oriented X 3, normal affect, Insight and Judgment appropriate.   Dressing of bacitracin ointment, non-adherent dressing, ABD pad applied with tape, light pressure applied with ACE wrap.     Izora Ribas, NP 5:11 PM [facility]

## 2017-06-21 NOTE — Patient Instructions (Signed)
Keep the area dry for a few more days, can apply a bacitracin or triple antibiotic ointment, followed by a non-adherent dressing/ace wrap for a few more days, then follow up late Thursday or Friday for another check to make sure there is no infection.    Constipation, Adult Constipation is when a person has fewer bowel movements in a week than normal, has difficulty having a bowel movement, or has stools that are dry, hard, or larger than normal. Constipation may be caused by an underlying condition. It may become worse with age if a person takes certain medicines and does not take in enough fluids. Follow these instructions at home: Eating and drinking   Eat foods that have a lot of fiber, such as fresh fruits and vegetables, whole grains, and beans.  Limit foods that are high in fat, low in fiber, or overly processed, such as french fries, hamburgers, cookies, candies, and soda.  Drink enough fluid to keep your urine clear or pale yellow. General instructions  Exercise regularly or as told by your health care provider.  Go to the restroom when you have the urge to go. Do not hold it in.  Take over-the-counter and prescription medicines only as told by your health care provider. These include any fiber supplements.  Practice pelvic floor retraining exercises, such as deep breathing while relaxing the lower abdomen and pelvic floor relaxation during bowel movements.  Watch your condition for any changes.  Keep all follow-up visits as told by your health care provider. This is important. Contact a health care provider if:  You have pain that gets worse.  You have a fever.  You do not have a bowel movement after 4 days.  You vomit.  You are not hungry.  You lose weight.  You are bleeding from the anus.  You have thin, pencil-like stools. Get help right away if:  You have a fever and your symptoms suddenly get worse.  You leak stool or have blood in your stool.  Your  abdomen is bloated.  You have severe pain in your abdomen.  You feel dizzy or you faint. This information is not intended to replace advice given to you by your health care provider. Make sure you discuss any questions you have with your health care provider. Document Released: 06/11/2004 Document Revised: 04/02/2016 Document Reviewed: 03/03/2016 Elsevier Interactive Patient Education  2017 [employer].

## 2017-06-22 DIAGNOSIS — I700 Atherosclerosis of aorta: Secondary | ICD-10-CM | POA: Insufficient documentation

## 2017-06-22 NOTE — Progress Notes (Signed)
Assessment and Plan:  Gordon was seen today for follow-up.  Diagnoses and all orders for this visit:  Laceration of lower extremity, right, subsequent encounter -     cephALEXin (KEFLEX) 500 MG capsule; Take 1 capsule (500 mg total) by mouth 3 (three) times daily. -     AMB referral to wound care center  Wound dressing instructions and ER precautions provided for progressing signs of infection, fever, or confusion over the weekend. Starting antibiotic today, will make urgent referral to ambulatory wound care center.   Further disposition pending results of labs. Discussed med's effects and SE's.   Over 15 minutes of exam, counseling, chart review, and critical decision making was performed.   Future Appointments Date Time Provider [place]  08/16/2017 2:30 PM Unk Pinto, MD [facility] None  09/22/2017 3:40 PM Lelon Perla, MD [facility]  10/05/2017 3:00 PM Vicie Mutters, PA-C [facility] None    ------------------------------------------------------------------------------------------------------------------   HPI 83 y.o.female presents presents with her husband for follow up to a right shin laceration that she sustained 4 days ago and was seen at an urgent care, then an ER for continued bleeding issues. We saw her in office 2 days ago, and today are following up to reapply a dressing and to monitor for signs of infection.  Patient denies increased pain, signs of infection, fever, chills, noting injection/discharge of the wound, confusion or other changes in mental status.   The wound has developed some signs of infection- area is injected, warm to touch, with some purulent discharge present; consulted Dr. Melford Aase and he is in agreement that an antibiotic should be initiated and a referral be made to a wound care clinic. We will see her back on Monday (in 4 days), and ER precautions will be provided for over the weekend.   Past Medical History:  Diagnosis Date   . Aortic regurgitation   . Bronchiectasis ([place])   . COPD (chronic obstructive pulmonary disease) ([place])   . DJD (degenerative joint disease)   . HLD (hyperlipidemia)   . HTN (hypertension)   . Mild mitral regurgitation by prior echocardiogram   . Mild tricuspid regurgitation   . Palpitations    a. has known history of PAC's and PVC's. b. 09/2015: monitor showed sinus rhythm with occasional PAC's and a brief episode of PAT.  [name] Thyroid disease      Allergies  Allergen Reactions  . Vancomycin     Worsening kidney function  . Sulfa Antibiotics Palpitations    Unknown    Current Outpatient Prescriptions on File Prior to Visit  Medication Sig  . ALPRAZolam (XANAX) 0.25 MG tablet Take 0.25 mg by mouth as needed for anxiety.  [name] apixaban (ELIQUIS) 2.5 MG TABS tablet Take 1 tablet (2.5 mg total) by mouth 2 (two) times daily.  . Ascorbic Acid (VITAMIN C) 500 MG tablet Take 500 mg by mouth daily.    . cholecalciferol (VITAMIN D) 1000 UNITS tablet Take 1,000 Units by mouth daily.   [name] diltiazem (CARDIZEM) 60 MG tablet Take 1 tablet (60 mg total) by mouth 4 (four) times daily.  . feeding supplement, ENSURE ENLIVE, (ENSURE ENLIVE) LIQD Take 237 mLs by mouth 2 (two) times daily between meals.  . furosemide (LASIX) 20 MG tablet Take 20 mg by mouth as needed. For 2 days stop 04/29/2017   . ipratropium-albuterol (DUONEB) 0.5-2.5 (3) MG/3ML SOLN Take 3 mLs by nebulization every 6 (six) hours as needed.  [name] levothyroxine (SYNTHROID, LEVOTHROID) 50 MCG tablet TAKE 1  TABLET BY MOUTH DAILY OR AS DIRECTED  . Metoprolol Tartrate 37.5 MG TABS Take 37.5 mg by mouth 2 (two) times daily.   No current facility-administered medications on file prior to visit.     ROS: all negative except above.   Physical Exam:  BP 130/60   Pulse 66   Temp 97.7 F (36.5 C)   Resp 14   Ht _0  (1.6 m)   Wt 103 lb (46.7 kg)   SpO2 94%   BMI 18.25 kg/m   General Appearance: Well nourished, in no apparent  distress. Neck: Supple. Respiratory: Respiratory effort normal, BS equal bilaterally without rales, rhonchi, wheezing or stridor.  Cardio: RRR with no MRGs. Brisk peripheral pulses with bilateral ankle nonpitting edema.  Lymphatics: Non tender without lymphadenopathy.  Musculoskeletal: Full ROM, 5/5 strength, normal gait.  Skin: Overall warm/dry; right shin wound with steri strips intact; absorbant dressing with moderate serosanguinous discharge; wound appears injected with extensive ecchymosis, two small areas blackened/wet with bubbles, some purulent discharge present. Surrounding skin mildly injected but not notably warm.  Neuro: Normal muscle tone,  Sensation intact.  Psych: Awake and oriented X 3, normal affect, Insight and Judgment appropriate.     Izora Ribas, NP 11:54 AM [facility]

## 2017-06-23 ENCOUNTER — Ambulatory Visit (INDEPENDENT_AMBULATORY_CARE_PROVIDER_SITE_OTHER): Payer: Medicare Other | Admitting: Adult Health

## 2017-06-23 ENCOUNTER — Encounter: Payer: Self-pay | Admitting: Adult Health

## 2017-06-23 VITALS — BP 130/60 | HR 66 | Temp 97.7°F | Resp 14 | Ht 63.0 in | Wt 103.0 lb

## 2017-06-23 DIAGNOSIS — S81811D Laceration without foreign body, right lower leg, subsequent encounter: Secondary | ICD-10-CM | POA: Diagnosis not present

## 2017-06-23 MED ORDER — CEPHALEXIN 500 MG PO CAPS: 500.0000 mg | ORAL_CAPSULE | Freq: Three times a day (TID) | ORAL | 0 refills | Status: DC

## 2017-06-23 NOTE — Patient Instructions (Signed)
We are prescribing you Keflex 500 mg three times a day - take with food or glass of milk.  Change the dressing daily- may apply triple antibiotic ointment or bacitracin ointment, apply a non-adherent dressing directly to the wound, a gauze/absorbant dressing on top, then tape/ACE to hold in place.   Continue to monitor the wound and watch for redness, heat, pus coming out; if you notice redness spreading, or develop a fever or feel bad, or if the patient is not acting herself, we recommend that you go to the ER.    Wound Infection A wound infection happens when germs start to grow in the wound. Germs that cause wound infections are most commonly bacteria. Other types of infections can occur as well. In some cases, infection can cause the wound to break open. Wound infections need treatment. If a wound infection is left untreated, complications can occur. This may include an infection in your bloodstream (sepsis) or in a bone (osteomyelitis). What are the causes? This condition is caused by germs growing in the wound. What increases the risk? The following factors may make you more likely to develop this condition:  Having a weak body defense system (immune system).  Having diabetes.  Taking steroid medicines for a long time (chronic use).  Smoking.  Older age.  Being overweight.  What are the signs or symptoms? Symptoms of this condition include:  Having more redness, swelling, or pain at the wound site.  Having more blood, pus, or fluid at the wound site.  A bad smell coming from a wound or bandage (dressing).  Having a fever.  Feeling tired or fatigued.  How is this diagnosed? This condition is diagnosed with a medical history and physical exam. You may also have blood tests. How is this treated? This condition is treated with an antibiotic medicine. The infection should improve 24-48 hours after you start antibiotics. Any redness around the wound should stop spreading,  and the wound should be less painful. Follow these instructions at home: Medicines  Take or apply over-the-counter and prescription medicines only as told by your health care provider.  If you were prescribed antibiotic medicine, take or apply it as told by your health care provider. Do not stop using the antibiotic even if your condition improves. Wound care  Clean the wound each day or as told by your health care provider. ? Wash the wound with mild soap and water. ? Rinse the wound with water to remove all soap. ? Pat the wound dry with a clean towel. Do not rub it.  Follow instructions from your health care provider about how to take care of your wound. Make sure you: ? Wash your hands with soap and water before you change your dressing. If soap and water are not available, use hand sanitizer. ? Change your dressing as told by your health care provider. ? Leave stitches (sutures), skin glue, or adhesive strips in place, if this applies. These skin closures may need to stay in place for 2 weeks or longer. If adhesive strip edges start to loosen and curl up, you may trim the loose edges. Do not remove adhesive strips completely unless your health care provider tells you to do that. Some wounds are left open to heal on their own.  Check your wound every day for signs of infection. Watch for: ? More redness, swelling, or pain. ? More fluid or blood. ? Warmth. ? Pus or a bad smell. General instructions  Keep the dressing  dry until your health care provider says it can be removed.  Do not take baths, swim, use a hot tub, or do anything that would put your wound underwater until your health care provider approves.  Raise (elevate) the injured area above the level of your heart while you are sitting or lying down.  Do not scratch or pick at the wound.  Keep all follow-up visits as told by your health care provider. This is important. Contact a health care provider if:  Your pain is  not controlled with medicine.  You have more redness, swelling, or pain around your wound.  You have more fluid or blood coming from your wound.  Your wound feels warm to the touch.  You have pus coming from your wound.  You continue to notice a bad smell coming from your wound or your dressing.  Your wound that was closed breaks open. Get help right away if:  You have a red streak going away from your wound.  You have a fever. This information is not intended to replace advice given to you by your health care provider. Make sure you discuss any questions you have with your health care provider. Document Released: 06/12/2003 Document Revised: 02/25/2016 Document Reviewed: 03/03/2015 Elsevier Interactive Patient Education  2018 [employer].

## 2017-06-27 ENCOUNTER — Encounter (HOSPITAL_COMMUNITY): Payer: Self-pay | Admitting: Emergency Medicine

## 2017-06-27 ENCOUNTER — Emergency Department (HOSPITAL_COMMUNITY): Payer: Medicare Other

## 2017-06-27 ENCOUNTER — Encounter: Payer: Self-pay | Admitting: Adult Health

## 2017-06-27 ENCOUNTER — Inpatient Hospital Stay (HOSPITAL_COMMUNITY)
Admission: EM | Admit: 2017-06-27 | Discharge: 2017-06-30 | DRG: 570 | Disposition: A | Discharge status: Home Health | Payer: Medicare Other | Attending: Internal Medicine | Admitting: Internal Medicine

## 2017-06-27 ENCOUNTER — Encounter (HOSPITAL_COMMUNITY): Payer: Self-pay | Admitting: *Deleted

## 2017-06-27 ENCOUNTER — Emergency Department (HOSPITAL_COMMUNITY)
Admission: EM | Admit: 2017-06-27 | Discharge: 2017-06-27 | Disposition: A | Discharge status: Home / Self Care | Payer: Medicare Other | Source: Home / Self Care

## 2017-06-27 ENCOUNTER — Ambulatory Visit (INDEPENDENT_AMBULATORY_CARE_PROVIDER_SITE_OTHER): Payer: Medicare Other | Admitting: Adult Health

## 2017-06-27 VITALS — BP 170/72 | HR 72 | Temp 98.1°F | Resp 16 | Ht 63.0 in | Wt 104.2 lb

## 2017-06-27 DIAGNOSIS — L03115 Cellulitis of right lower limb: Secondary | ICD-10-CM | POA: Diagnosis not present

## 2017-06-27 DIAGNOSIS — E43 Unspecified severe protein-calorie malnutrition: Secondary | ICD-10-CM | POA: Diagnosis present

## 2017-06-27 DIAGNOSIS — I96 Gangrene, not elsewhere classified: Secondary | ICD-10-CM | POA: Diagnosis not present

## 2017-06-27 DIAGNOSIS — Z9841 Cataract extraction status, right eye: Secondary | ICD-10-CM | POA: Diagnosis not present

## 2017-06-27 DIAGNOSIS — I480 Paroxysmal atrial fibrillation: Secondary | ICD-10-CM | POA: Diagnosis present

## 2017-06-27 DIAGNOSIS — I4891 Unspecified atrial fibrillation: Secondary | ICD-10-CM | POA: Diagnosis not present

## 2017-06-27 DIAGNOSIS — S81811D Laceration without foreign body, right lower leg, subsequent encounter: Secondary | ICD-10-CM

## 2017-06-27 DIAGNOSIS — I5032 Chronic diastolic (congestive) heart failure: Secondary | ICD-10-CM | POA: Diagnosis present

## 2017-06-27 DIAGNOSIS — I272 Pulmonary hypertension, unspecified: Secondary | ICD-10-CM | POA: Diagnosis not present

## 2017-06-27 DIAGNOSIS — Z9181 History of falling: Secondary | ICD-10-CM

## 2017-06-27 DIAGNOSIS — I482 Chronic atrial fibrillation: Secondary | ICD-10-CM | POA: Diagnosis not present

## 2017-06-27 DIAGNOSIS — Z681 Body mass index (BMI) 19 or less, adult: Secondary | ICD-10-CM | POA: Diagnosis not present

## 2017-06-27 DIAGNOSIS — I110 Hypertensive heart disease with heart failure: Secondary | ICD-10-CM | POA: Diagnosis present

## 2017-06-27 DIAGNOSIS — Z9842 Cataract extraction status, left eye: Secondary | ICD-10-CM

## 2017-06-27 DIAGNOSIS — L089 Local infection of the skin and subcutaneous tissue, unspecified: Secondary | ICD-10-CM

## 2017-06-27 DIAGNOSIS — Z90710 Acquired absence of both cervix and uterus: Secondary | ICD-10-CM | POA: Diagnosis not present

## 2017-06-27 DIAGNOSIS — I471 Supraventricular tachycardia: Secondary | ICD-10-CM | POA: Diagnosis not present

## 2017-06-27 DIAGNOSIS — E785 Hyperlipidemia, unspecified: Secondary | ICD-10-CM | POA: Diagnosis present

## 2017-06-27 DIAGNOSIS — Z23 Encounter for immunization: Secondary | ICD-10-CM

## 2017-06-27 DIAGNOSIS — Z7901 Long term (current) use of anticoagulants: Secondary | ICD-10-CM

## 2017-06-27 DIAGNOSIS — S81801A Unspecified open wound, right lower leg, initial encounter: Secondary | ICD-10-CM

## 2017-06-27 DIAGNOSIS — S81801S Unspecified open wound, right lower leg, sequela: Secondary | ICD-10-CM | POA: Diagnosis not present

## 2017-06-27 DIAGNOSIS — E782 Mixed hyperlipidemia: Secondary | ICD-10-CM | POA: Diagnosis present

## 2017-06-27 DIAGNOSIS — J449 Chronic obstructive pulmonary disease, unspecified: Secondary | ICD-10-CM

## 2017-06-27 DIAGNOSIS — L03119 Cellulitis of unspecified part of limb: Secondary | ICD-10-CM | POA: Diagnosis not present

## 2017-06-27 DIAGNOSIS — Z882 Allergy status to sulfonamides status: Secondary | ICD-10-CM

## 2017-06-27 DIAGNOSIS — Z881 Allergy status to other antibiotic agents status: Secondary | ICD-10-CM

## 2017-06-27 DIAGNOSIS — Z9981 Dependence on supplemental oxygen: Secondary | ICD-10-CM | POA: Diagnosis not present

## 2017-06-27 DIAGNOSIS — Z825 Family history of asthma and other chronic lower respiratory diseases: Secondary | ICD-10-CM

## 2017-06-27 DIAGNOSIS — F419 Anxiety disorder, unspecified: Secondary | ICD-10-CM | POA: Diagnosis present

## 2017-06-27 DIAGNOSIS — Z87891 Personal history of nicotine dependence: Secondary | ICD-10-CM | POA: Diagnosis not present

## 2017-06-27 DIAGNOSIS — J439 Emphysema, unspecified: Secondary | ICD-10-CM | POA: Diagnosis present

## 2017-06-27 DIAGNOSIS — M7989 Other specified soft tissue disorders: Secondary | ICD-10-CM | POA: Diagnosis not present

## 2017-06-27 DIAGNOSIS — Z79899 Other long term (current) drug therapy: Secondary | ICD-10-CM

## 2017-06-27 DIAGNOSIS — I2723 Pulmonary hypertension due to lung diseases and hypoxia: Secondary | ICD-10-CM | POA: Diagnosis not present

## 2017-06-27 DIAGNOSIS — J9611 Chronic respiratory failure with hypoxia: Secondary | ICD-10-CM | POA: Diagnosis not present

## 2017-06-27 DIAGNOSIS — Z8249 Family history of ischemic heart disease and other diseases of the circulatory system: Secondary | ICD-10-CM

## 2017-06-27 DIAGNOSIS — Z01818 Encounter for other preprocedural examination: Secondary | ICD-10-CM

## 2017-06-27 DIAGNOSIS — Z5321 Procedure and treatment not carried out due to patient leaving prior to being seen by health care provider: Secondary | ICD-10-CM

## 2017-06-27 DIAGNOSIS — I083 Combined rheumatic disorders of mitral, aortic and tricuspid valves: Secondary | ICD-10-CM | POA: Diagnosis present

## 2017-06-27 DIAGNOSIS — E039 Hypothyroidism, unspecified: Secondary | ICD-10-CM | POA: Diagnosis present

## 2017-06-27 DIAGNOSIS — Z9119 Patient's noncompliance with other medical treatment and regimen: Secondary | ICD-10-CM | POA: Diagnosis not present

## 2017-06-27 DIAGNOSIS — T148XXA Other injury of unspecified body region, initial encounter: Secondary | ICD-10-CM

## 2017-06-27 DIAGNOSIS — I2789 Other specified pulmonary heart diseases: Secondary | ICD-10-CM | POA: Diagnosis not present

## 2017-06-27 DIAGNOSIS — Z833 Family history of diabetes mellitus: Secondary | ICD-10-CM

## 2017-06-27 DIAGNOSIS — J438 Other emphysema: Secondary | ICD-10-CM | POA: Diagnosis not present

## 2017-06-27 DIAGNOSIS — I10 Essential (primary) hypertension: Secondary | ICD-10-CM | POA: Diagnosis not present

## 2017-06-27 DIAGNOSIS — E038 Other specified hypothyroidism: Secondary | ICD-10-CM | POA: Diagnosis not present

## 2017-06-27 DIAGNOSIS — M79605 Pain in left leg: Secondary | ICD-10-CM | POA: Diagnosis present

## 2017-06-27 LAB — COMPREHENSIVE METABOLIC PANEL
ALBUMIN: 3.3 g/dL — AB (ref 3.5–5.0)
ALK PHOS: 71 U/L (ref 38–126)
ALT: 13 U/L — ABNORMAL LOW (ref 14–54)
ANION GAP: 6 (ref 5–15)
AST: 19 U/L (ref 15–41)
BUN: 19 mg/dL (ref 6–20)
CO2: 32 mmol/L (ref 22–32)
Calcium: 9.1 mg/dL (ref 8.9–10.3)
Chloride: 99 mmol/L — ABNORMAL LOW (ref 101–111)
Creatinine, Ser: 0.91 mg/dL (ref 0.44–1.00)
GFR calc Af Amer: >60 mL/min (ref >60)
GFR calc non Af Amer: 56 mL/min — ABNORMAL LOW (ref >60)
GLUCOSE: 98 mg/dL (ref 65–99)
POTASSIUM: 4.3 mmol/L (ref 3.5–5.1)
SODIUM: 137 mmol/L (ref 135–145)
Total Bilirubin: 0.6 mg/dL (ref 0.3–1.2)
Total Protein: 7.3 g/dL (ref 6.5–8.1)

## 2017-06-27 LAB — CBC WITH DIFFERENTIAL/PLATELET
Basophils Absolute: 0 10*3/uL (ref 0.0–0.1)
Basophils Relative: 0 %
EOS PCT: 1 %
Eosinophils Absolute: 0.1 10*3/uL (ref 0.0–0.7)
HEMATOCRIT: 34.5 % — AB (ref 36.0–46.0)
Hemoglobin: 10.6 g/dL — ABNORMAL LOW (ref 12.0–15.0)
LYMPHS PCT: 20 %
Lymphs Abs: 2.1 10*3/uL (ref 0.7–4.0)
MCH: 29.8 pg (ref 26.0–34.0)
MCHC: 30.7 g/dL (ref 30.0–36.0)
MCV: 96.9 fL (ref 78.0–100.0)
MONO ABS: 0.8 10*3/uL (ref 0.1–1.0)
MONOS PCT: 8 %
NEUTROS ABS: 7.6 10*3/uL (ref 1.7–7.7)
Neutrophils Relative %: 71 %
Platelets: 267 10*3/uL (ref 150–400)
RBC: 3.56 MIL/uL — ABNORMAL LOW (ref 3.87–5.11)
RDW: 13.2 % (ref 11.5–15.5)
WBC: 10.7 10*3/uL — ABNORMAL HIGH (ref 4.0–10.5)

## 2017-06-27 LAB — I-STAT CG4 LACTIC ACID, ED: Lactic Acid, Venous: 0.55 mmol/L (ref 0.5–1.9)

## 2017-06-27 MED ORDER — CLINDAMYCIN PHOSPHATE 600 MG/50ML IV SOLN
600.0000 mg | Freq: Once | INTRAVENOUS | Status: AC
Start: 2017-06-27 — End: 2017-06-27
  Administered 2017-06-27: 600 mg via INTRAVENOUS
  Filled 2017-06-27: qty 50

## 2017-06-27 NOTE — Patient Instructions (Addendum)
Follow up with general surgery this afternoon at 2 pm, appointment with wound center on 10/10. Information for both appointments have been provided to you as printouts.

## 2017-06-27 NOTE — ED Triage Notes (Signed)
Pt sent from [place] surgery. Pt has wound to rle x 1 week. Was told she needed 24 hr iv abx prior to surgery. Nad. A/o.

## 2017-06-27 NOTE — Progress Notes (Signed)
Assessment and Plan:  Andrea [name] was seen today for follow-up.  Diagnoses and all orders for this visit:  Laceration of lower extremity, right, subsequent encounter, infected  Wound to right anterior shin, approx 10cm x10 cm area skin flap secured by steri strips; signs of infection at last visit prior to the weekend, keflex initiated and wound center referral was provided. Seen today with progressing signs of infection (none systemic) with purulent dischage/ necrotic tissue present- patient is also not following recommendations for cleaning/changing dressings on wound. Wound center unable to see until 10/10- anticipate need for I&D and significant treatment prior to then to avoid a negative outcome, consulted Dr. Melford Aase who is in agreement with plan. Same-day referral for general surgery obtained for this afternoon per daughter request, simple absorbant dressing reapplied.     Further disposition pending results of labs. Discussed med's effects and SE's.   Over 20 minutes of exam, counseling, chart review, and critical decision making was performed.   Future Appointments Date Time Provider [place]  07/06/2017 8:00 AM Ricard Dillon, MD [facility] None  08/16/2017 2:30 PM Unk Pinto, MD [facility] None  09/22/2017 3:40 PM Lelon Perla, MD [facility]  10/05/2017 3:00 PM Vicie Mutters, PA-C [facility] None    ------------------------------------------------------------------------------------------------------------------   HPI  BP (!) 170/72   Pulse 72   Temp 98.1 F (36.7 C)   Resp 16   Ht _0  (1.6 m)   Wt 104 lb 3.2 oz (47.3 kg)   BMI 18.46 kg/m   83 y.o.female presents for continued follow up of a R shin wound- U shaped, approximately 10x10 cm area of skin "flap" - sustained while getting out of a car on 9/23. Was seen at an urgent care where they placed steri strips and a simple dressing. The patient is on elequis for a-fib, noted the dressed wound  was bleeding and followed up with the ER that same day, where a pressure dressing was placed.   She followed up at our office 2 days later on 9/25 for a wound check/dressing change. Edges of wound appeared poorly approximated but remained secure with steris. No signs of infection at this time. Followed up 2 days later prior to the weekend, at which time signs of local infection present- warm/injected, purulent discharge, with bubbling present around the wound. Keflex started; instructions given to uncover/assess wound/irrigate/redress over the weekend. Wound center referral made at this time.   Patient presents for follow up today with her husband and daughter who is very concerned, states she believes the infection is worsening. The patient reports she did not remove the dressing to assess or change it over the weekend but has been taking the antibiotic without difficulty. She denies systemic symptoms- fever, chills, weakness, nausea, changes in appetite, confusion. She endorses she is now having pain with weight bearing and with rotation of right ankle.   Wound infection is progressing;  Wound with moderate amount  purulent discharge in absorbant dressing, yellow/black appearance of tissue bed, surrounding area erythematous and warm to touch. General surgery referral for debridement discussed with Dr. Melford Aase, he is in agreement, appointment made for this afternoon. Simple absorbant dressing reapplied.   Patient with extensive cardiovascular history; recently discharged from SNF after hospitalization through ED for acute on chronic heart failure and small bowel obstruction.   Past Medical History:  Diagnosis Date  . Aortic insufficiency 09/04/2015  . Aortic regurgitation   . Bronchiectasis ([place])   . COPD (chronic obstructive pulmonary disease) ([place])   .  DJD (degenerative joint disease)   . HLD (hyperlipidemia)   . HTN (hypertension)   . Mild mitral regurgitation by prior echocardiogram   . Mild  mitral regurgitation by prior echocardiogram   . Mild tricuspid regurgitation   . Palpitations    a. has known history of PAC's and PVC's. b. 09/2015: monitor showed sinus rhythm with occasional PAC's and a brief episode of PAT.  [name] Thyroid disease      Allergies  Allergen Reactions  . Vancomycin     Worsening kidney function  . Sulfa Antibiotics Palpitations    Unknown    Current Outpatient Prescriptions on File Prior to Visit  Medication Sig  . ALPRAZolam (XANAX) 0.25 MG tablet Take 0.25 mg by mouth as needed for anxiety.  [name] apixaban (ELIQUIS) 2.5 MG TABS tablet Take 1 tablet (2.5 mg total) by mouth 2 (two) times daily.  . Ascorbic Acid (VITAMIN C) 500 MG tablet Take 500 mg by mouth daily.    . cephALEXin (KEFLEX) 500 MG capsule Take 1 capsule (500 mg total) by mouth 3 (three) times daily.  . cholecalciferol (VITAMIN D) 1000 UNITS tablet Take 1,000 Units by mouth daily.   [name] diltiazem (CARDIZEM) 60 MG tablet Take 1 tablet (60 mg total) by mouth 4 (four) times daily.  . feeding supplement, ENSURE ENLIVE, (ENSURE ENLIVE) LIQD Take 237 mLs by mouth 2 (two) times daily between meals.  . furosemide (LASIX) 20 MG tablet Take 20 mg by mouth as needed. For 2 days stop 04/29/2017   . ipratropium-albuterol (DUONEB) 0.5-2.5 (3) MG/3ML SOLN Take 3 mLs by nebulization every 6 (six) hours as needed.  [name] levothyroxine (SYNTHROID, LEVOTHROID) 50 MCG tablet TAKE 1 TABLET BY MOUTH DAILY OR AS DIRECTED  . Metoprolol Tartrate 37.5 MG TABS Take 37.5 mg by mouth 2 (two) times daily.   No current facility-administered medications on file prior to visit.     ROS: Review of Systems  Constitutional: Negative.  Negative for chills, diaphoresis, fever and malaise/fatigue.  Respiratory: Negative for cough, shortness of breath and wheezing.   Cardiovascular: Positive for leg swelling (No worse than baseline- bilateral). Negative for chest pain, palpitations and orthopnea.  Gastrointestinal: Negative for abdominal  pain, diarrhea, nausea and vomiting.  Genitourinary: Negative.   Musculoskeletal: Positive for joint pain (New right ankle pain with rotation). Negative for myalgias.  Skin: Negative.   Neurological: Negative for dizziness, tingling, sensory change, speech change, weakness and headaches.  Endo/Heme/Allergies: Bruises/bleeds easily.  Psychiatric/Behavioral: Negative.  Negative for memory loss.     Physical Exam:  BP (!) 170/72   Pulse 72   Temp 98.1 F (36.7 C)   Resp 16   Ht _0  (1.6 m)   Wt 104 lb 3.2 oz (47.3 kg)   BMI 18.46 kg/m   General Appearance: Well nourished, in no apparent distress. Eyes: PERRLA ENT/Mouth: Hearing normal.  Neck: Supple.  Respiratory: Respiratory effort normal, BS equal bilaterally without rales, rhonchi, wheezing or stridor.  Cardio: Irregularly irregular without murmurs noted; bilateral ankles with 2+ pitting edema, thready pedal/anterior tibial pulses consistent with baseline.  Abdomen: Soft, + BS.  Non tender, no palpable masses.  Musculoskeletal: Pain to right ankle/ lateral lower leg with passive/active ROM, antalgic slow gait with cane.  Skin: Right shin wound with moderate purulent discharge to absorbant dressing, poorly approximated wound edges with steris present but beginning to peel off. Exposed wound beds are yellow/black, moist.  Majority of anterior lower leg is injected and warm to touch.  Neuro:  Normal muscle tone, no cerebellar symptoms. Sensation intact.  Psych: Awake and oriented X 3, normal affect, Insight and Judgment appropriate.     Izora Ribas, NP 12:17 PM [facility]

## 2017-06-27 NOTE — ED Notes (Addendum)
Applied xeroform gauze to right leg wound.

## 2017-06-27 NOTE — ED Provider Notes (Signed)
Andrea [name] DEPT Provider Note   CSN: [id number] Arrival date & time: 06/27/17  1719     History   Chief Complaint Chief Complaint  Patient presents with  . Wound Infection    HPI Andrea [name] is a 83 y.o. female.  Patient is an 36 roll female with a history of aortic insufficiency, COPD, hypertension, hyperlipidemia, CHF, paroxysmal atrial fibrillation who is chronically anticoagulated presenting today for worsening wound on her right lower extremity. Patient hit her shinon a door approximately 2 weeks ago resulting in a skin tear and laceration. This was initially Steri-Stripped at urgent care but due to ongoing bleeding she was seen in the emergency department over a week ago. At that time a compression dressing was placed and she was told to follow-up with her doctor in 3 days. She did follow-up with her PCP there was concern for the possibility for developing cellulitis. She was placed on Keflex. Apparently the wound continues to look worse so she saw surgery today for further evaluation. Despite taking Keflex appeared that the cellulitis was worsening and the wound had necrotic tissue. The tissue was removed in the office and she was told to go to the emergency room for admission and IV antibiotics. Patient denies any systemic symptoms but does complain of pain and swelling of the right lower leg.  No fever, shortness of breath, chest pain, cough, abdominal pain, nausea or vomitg   The history is provided by the patient, a relative and medical records.    Past Medical History:  Diagnosis Date  . Aortic insufficiency 09/04/2015  . Aortic regurgitation   . Bronchiectasis ([place])   . COPD (chronic obstructive pulmonary disease) ([place])   . DJD (degenerative joint disease)   . HLD (hyperlipidemia)   . HTN (hypertension)   . Mild mitral regurgitation by prior echocardiogram   . Mild mitral regurgitation by prior echocardiogram   . Mild tricuspid regurgitation   . Palpitations    a. has known history of PAC's and PVC's. b. 09/2015: monitor showed sinus rhythm with occasional PAC's and a brief episode of PAT.  [name] Thyroid disease     Patient Active Problem List   Diagnosis Date Noted  . Cellulitis of right lower extremity 06/27/2017  . Aortic atherosclerosis ([place]) 06/22/2017  . Anterior basement membrane dystrophy 04/27/2017  . Anterior corneal dystrophy 04/27/2017  . Protein-calorie malnutrition, severe ([place]) 04/08/2017  . AF (paroxysmal atrial fibrillation) ([place]) 04/03/2017  . Atrial tachycardia ([place]) 04/02/2017  . Chronic respiratory failure with hypoxia ([place]) 03/31/2017  . Chronic diastolic CHF (congestive heart failure) ([place]) 12/27/2016  . Pulmonary hypertension due to COPD ([place]) 10/04/2016  . Renal insufficiency 04/19/2016  . Skin cancer 09/25/2015  . Hypothyroidism 09/25/2015  . Osteoporosis 09/25/2015  . Pseudophakia 07/31/2014  . Vitamin D deficiency 01/23/2014  . Medication management 01/23/2014  . Hyperlipidemia 08/10/2013  . Prediabetes 08/10/2013  . Bilateral dry eyes 09/08/2011  . Exposure keratopathy 09/08/2011  . DJD (degenerative joint disease)   . Essential hypertension 05/08/2010  . COPD with emphysema ([place]) 05/08/2010  . Lung nodules 05/08/2010    Past Surgical History:  Procedure Laterality Date  . ABDOMINAL HYSTERECTOMY  1993  . CATARACT EXTRACTION, BILATERAL      OB History    No data available       Home Medications    Prior to Admission medications   Medication Sig Start Date End Date Taking? Authorizing Provider  ALPRAZolam Duanne Moron) 0.25 MG tablet Take 0.25 mg by mouth  at bedtime.    Yes [provider]  apixaban (ELIQUIS) 2.5 MG TABS tablet Take 1 tablet (2.5 mg total) by mouth 2 (two) times daily. 05/26/17  Yes Lelon Perla, MD  Ascorbic Acid (VITAMIN C) 500 MG tablet Take 500 mg by mouth daily.     Yes [provider]  cephALEXin (KEFLEX) 500 MG capsule Take 1 capsule (500 mg total) by mouth 3  (three) times daily. 06/23/17 07/03/17 Yes Liane Comber, NP  cholecalciferol (VITAMIN D) 1000 UNITS tablet Take 1,000 Units by mouth daily.    Yes [provider]  diltiazem (CARDIZEM) 90 MG tablet Take 90 mg by mouth 4 (four) times daily. 05/31/17  Yes [provider]  feeding supplement, ENSURE ENLIVE, (ENSURE ENLIVE) LIQD Take 237 mLs by mouth 2 (two) times daily between meals. 04/12/17  Yes Rama, Venetia Maxon, MD  furosemide (LASIX) 20 MG tablet Take 20 mg by mouth daily as needed for fluid.    Yes [provider]  ipratropium-albuterol (DUONEB) 0.5-2.5 (3) MG/3ML SOLN Take 3 mLs by nebulization every 6 (six) hours as needed. 04/25/16  Yes Memon, Jolaine Artist, MD  levothyroxine (SYNTHROID, LEVOTHROID) 50 MCG tablet TAKE 1 TABLET BY MOUTH DAILY OR AS DIRECTED Patient taking differently: TAKE 1 TABLET BY MOUTH DAILY 09/03/16  Yes Unk Pinto, MD  Metoprolol Tartrate 37.5 MG TABS Take 37.5 mg by mouth 2 (two) times daily. 05/31/17  Yes Lelon Perla, MD  diltiazem (CARDIZEM) 60 MG tablet Take 1 tablet (60 mg total) by mouth 4 (four) times daily. Patient not taking: Reported on 06/27/2017 06/03/17   Lelon Perla, MD    Family History Family History  Problem Relation Age of Onset  . Emphysema Mother   . Diabetes Mother   . Cancer Father   . Emphysema Sister   . Emphysema Sister   . Rheumatic fever Sister   . Heart attack Sister   . Diabetes Sister   . Diabetes Brother   . Asthma Brother        as a child    Social History Social History  Substance Use Topics  . Smoking status: Former Smoker    Packs/day: 1.00    Years: 20.00    Types: Cigarettes    Quit date: 09/27/1974  . Smokeless tobacco: Never Used  . Alcohol use No     Allergies   Vancomycin and Sulfa antibiotics   Review of Systems Review of Systems  All other systems reviewed and are negative.    Physical Exam Updated Vital Signs BP (!) 164/63 (BP Location: Right Arm)   Pulse 64    Temp (!) 97.5 F (36.4 C) (Oral)   Resp 18   SpO2 91%   Physical Exam  Constitutional: She is oriented to person, place, and time. She appears well-developed and well-nourished. No distress.  HENT:  Head: Normocephalic and atraumatic.  Mouth/Throat: Oropharynx is clear and moist.  Eyes: Pupils are equal, round, and reactive to light. Conjunctivae and EOM are normal.  Neck: Normal range of motion. Neck supple.  Cardiovascular: Normal rate, regular rhythm and intact distal pulses.   No murmur heard. Pulmonary/Chest: Effort normal and breath sounds normal. No respiratory distress. She has no wheezes. She has no rales.  Musculoskeletal: Normal range of motion. She exhibits no edema or tenderness.  Neurological: She is alert and oriented to person, place, and time.  Skin: Skin is warm and dry. No rash noted. No erythema.  Open wound on the right  mid shin with surrounding erythema, warmth and edema. Mild necrotic tissue on the edges of the wound. Middle of the wound with pink wound bed  Psychiatric: She has a normal mood and affect. Her behavior is normal.  Nursing note and vitals reviewed.    ED Treatments / Results  Labs (all labs ordered are listed, but only abnormal results are displayed) Labs Reviewed - No data to display  EKG  EKG Interpretation None       Radiology Dg Tibia/fibula Right  Result Date: 06/27/2017 CLINICAL DATA:  Patient with right lower extremity wound for 1 week. Lower tibia and fibula swelling. Initial encounter. EXAM: RIGHT TIBIA AND FIBULA - 2 VIEW COMPARISON:  None. FINDINGS: Normal anatomic alignment. No evidence for acute fracture or dislocation. Bandaging material overlying the anterior distal lower extremity. IMPRESSION: No acute osseous abnormality. Electronically Signed   By: Lovey Newcomer M.D.   On: 06/27/2017 18:26    Procedures Procedures (including critical care time)  Medications Ordered in ED Medications  clindamycin (CLEOCIN) IVPB 600 mg  (0 mg Intravenous Stopped 06/27/17 2318)     Initial Impression / Assessment and Plan / ED Course  I have reviewed the triage vital signs and the nursing notes.  Pertinent labs & imaging results that were available during my care of the patient were reviewed by me and considered in my medical decision making (see chart for details).     Patient presenting with cellulitis requiring admission for IV antibiotics. Patient had labs done in the [facility] but due to the weight they decided to come here. Patient has a white count of 13,000 but normal BMP and lactate. She had an x-ray of her tib-fib without acute findings. Patient has an allergy to vancomycin as it caused renal failure tissue started on clindamycin. Will admit for further care.  Final Clinical Impressions(s) / ED Diagnoses   Final diagnoses:  Cellulitis of right lower extremity  Wound infection    New Prescriptions New Prescriptions   No medications on file     Blanchie Dessert, MD 06/27/17 2334

## 2017-06-27 NOTE — ED Triage Notes (Signed)
Pt sent here by her NP for increasing infection to RLE despite treatment with Keflex.

## 2017-06-27 NOTE — Progress Notes (Signed)
Andrea [name] 06/27/2017 2:31 PM Location: [facility] Patient #: [id number] DOB: Sep 13, 1929 Married / Language: English / Race: White Female   History of Present Illness The patient is a 83 year old female who presents for wound check. Past medical history of A. fib on Eliquis and chronic bilateral lower extremity edema. She sustained a right lower extremity laceration on 06/19/17 while getting out of a car and was seen by urgent care afterwards who placed Steri-Strips on a large U shaped skin flap. After returning home, she experienced bleeding to the area and went to the ER where they placed a pressure dressing and she was advised her to follow-up with her PCP, Liane Comber and hold Eliquis for at least 3 days or until the wound stops bleeding per cardiologist, Dr. Stanford Breed.  The following are her office visits with Liane Comber, NP 06/21/17 - Area was re-wrapped and advised to use bacitracin/triple antibiotic ointment, non-adherent dressing, and Ace bandage. She advised her to restart Eliquis.  06/23/17 - Warmth and purulent drainage developed in the area so she was given Keflex and a referral to the wound care clinic 06/27/17 - More purulent drainage and necrotic tissue seen on exam. New pain with weight-bearing and ankle rotation.  Referred to [facility] for further evaluation because her wound care center appointment is not until 07/06/17  Today, she is presenting to Urgent office with her husband and daughter. She states the area has been wrapped up so she is unsure if the redness has spread in the past few days. She states she still has a few days left of Keflex. States there is one area of tenderness and the remaining area is numb. She also complains of pain near her lateral malleolus and numbness in her right foot. Denies fever/chills. She took her last dose of Eliquis at lunch today.    Past Surgical History Cataract Surgery  Bilateral. Hysterectomy (not due to  cancer) - Complete   Diagnostic Studies History  Colonoscopy  1-5 years ago Mammogram  1-3 years ago  Allergies Vancomycin HCl *Anti-infective Agents  Sulfa Drugs  Allergies Reconciled   Medication History  ALPRAZolam (0.5MG Tablet, Oral) Active. Bisoprolol-Hydrochlorothiazide (5-6.25MG Tablet, Oral) Active. Cephalexin (500MG Capsule, Oral) Active. DilTIAZem HCl (60MG Tablet, Oral) Active. DilTIAZem HCl (90MG Tablet, Oral) Active. DilTIAZem HCl ER Beads (360MG Capsule ER 24HR, Oral) Active. Eliquis (2.5MG Tablet, Oral) Active. Losartan Potassium (100MG Tablet, Oral) Active. Metoprolol Tartrate (25MG Tablet, Oral) Active. Omeprazole (40MG Capsule DR, Oral) Active. Medications Reconciled  Social History No alcohol use  No caffeine use  No drug use  Tobacco use  Former smoker.  Family History Arthritis  Daughter. Cancer  Father. Hypertension  Mother. Thyroid problems  Daughter.  Pregnancy / Birth History Age of menopause  53-50 Gravida  7 Length (months) of breastfeeding  3-6 Maternal age  44-25 Para  44  Other Problems  Anxiety Disorder  Arthritis  Atrial Fibrillation  Back Pain  Congestive Heart Failure  Diverticulosis  Gastroesophageal Reflux Disease  High blood pressure  Home Oxygen Use  Hypercholesterolemia  Thyroid Disease    Review of Systems  General Not Present- Appetite Loss, Chills, Fatigue, Fever, Night Sweats, Weight Gain and Weight Loss. Skin Present- New Lesions and Non-Healing Wounds. Not Present- Change in Wart/Mole, Dryness, Hives, Jaundice, Rash and Ulcer. HEENT Present- Hearing Loss, Seasonal Allergies and Visual Disturbances. Not Present- Earache, Hoarseness, Nose Bleed, Oral Ulcers, Ringing in the Ears, Sinus Pain, Sore Throat, Wears glasses/contact lenses and Yellow Eyes.  Respiratory Present- Snoring. Not Present- Bloody sputum, Chronic Cough, Difficulty Breathing and Wheezing. Breast Not  Present- Breast Mass, Breast Pain, Nipple Discharge and Skin Changes. Cardiovascular Present- Palpitations, Rapid Heart Rate, Shortness of Breath and Swelling of Extremities. Not Present- Chest Pain, Difficulty Breathing Lying Down and Leg Cramps. Gastrointestinal Present- Bloating, Bloody Stool, Change in Bowel Habits and Excessive gas. Not Present- Abdominal Pain, Chronic diarrhea, Constipation, Difficulty Swallowing, Gets full quickly at meals, Hemorrhoids, Indigestion, Nausea, Rectal Pain and Vomiting. Female Genitourinary Not Present- Frequency, Nocturia, Painful Urination, Pelvic Pain and Urgency. Musculoskeletal Present- Back Pain, Joint Pain, Joint Stiffness, Muscle Weakness and Swelling of Extremities. Not Present- Muscle Pain. Neurological Not Present- Decreased Memory, Fainting, Headaches, Numbness, Seizures, Tingling, Tremor, Trouble walking and Weakness. Psychiatric Present- Anxiety. Not Present- Bipolar, Change in Sleep Pattern, Depression, Fearful and Frequent crying. Endocrine Present- Cold Intolerance. Not Present- Excessive Hunger, Hair Changes, Heat Intolerance, Hot flashes and New Diabetes. Hematology Present- Blood Thinners, Easy Bruising, Excessive bleeding and Gland problems. Not Present- HIV and Persistent Infections.  Vitals  06/27/2017 2:33 PM Weight: 104.6 lb Height: 63in Body Surface Area: 1.47 m Body Mass Index: 18.53 kg/m  Temp.: 98.28F  Pulse: 71 (Regular)  BP: 130/70 (Sitting, Left Arm, Standard)   Physical Exam  GENERAL: Thin, elderly female in no acute distress Hard of hearing Walks with a cane  EYES: Pupils equal, lids normal  EXTERNAL EARS: Intact, no masses or lesions EXTERNAL NOSE: Intact, no masses or lesions MOUTH: Lips - no lesions Dentition - normal for age  RESPIRATORY: Normal effort, no use of accessory muscles  MUSCULOSKELETAL: Walks with a cane Grossly normal ROM upper extremities Grossly normal ROM lower  extremities  SKIN: Warm and dry Not diaphoretic  PSYCHIATRIC: Normal mood and affect  Integumentary Right lower extremity: Anterior shin - 10 cm x 10 cm skin flap with areas of sloughing necrosis and 3-4 steristrips in place Skin flap is adherent to overlying soft tissue Erythema to large area of lower leg - outlined with a marking pen No bleeding at this time  The Steri-Strips were removed The area was scrubbed with Betadine and a superficial layer of skin was removed revealing sloughing necrosis No bleeding observed Area was covered with wet-to-dry dressing and wrapped with Kerlix and Ace bandage   Assessment & Plan WOUND OF RIGHT LOWER EXTREMITY -Dr. Dema Severin also evaluated the patient with me. After cleaning the wound with betadine and visualizing some areas of sloughing necrosis, it was advised that the patient go to the ER to receive 24 hours of IV antibiotics and monitoring for spreading erythema. If she does not need urgent debridement, but her symptoms do not improve, she may need to be evaluated by the wound care service in the hospital. The family and patient provided verbal understanding and will go to [facility] ER from here.  Signed electronically by Kellie Shropshire, PA-C (06/27/2017 3:48 PM)

## 2017-06-27 NOTE — H&P (Signed)
History and Physical    CRYSTEN KAMAN WLS:[id number] DOB: 06/30/1929 DOA: 06/27/2017  PCP: Unk Pinto, MD   Patient coming from: Home.  I have personally briefly reviewed patient's old medical records in [place]  Chief Complaint: RLE wound.  HPI: Andrea [name] is a 83 y.o. female with medical history significant of aortic insufficiency, mild mitral and tricuspid regurgitation, COPD, grade 2 diastolic dysfunction by 8115 echocardiogram, benign HDL hyperlipidemia, hypertension, chronic atrial fibrillation with a shot by a score of at least 5, hypothyroidism was coming to the emergency department after being referred from [place] [facility] by Dr. Dema Severin for inpatient treatment of RLE wound that she sustained last week that has not responded to oral antibiotic therapy. She initially went to [facility] ED, but due to the long wait and since she lives locally came to [facility] ED. She denies fever, chills, sore throat, worsening dyspnea, chest pain, palpitations, dizziness, diaphoresis, lower extremity edema, PND or orthopnea. No abdominal pain nausea, emesis, diarrhea, constipation, melena or hematochezia. No polyuria, polydipsia or blurred vision. Denies dysuria, frequency or hematuria.  ED Course: Initial vital signs at [facility] ED were temperature 97.70F, pulse 76, respirations 19, blood pressure 175/64 mmHg and O2 sat of 94% on room air. She received clindamycin 600 mg IVPB.  Lab work done earlier today shows WBC of 10.7 with 71% neutrophils, hemoglobin 10.6 g/dL and platelets 267. Her chemistry shows a chloride of 99, carbon dioxide of 32 and lactic acid of 0.55 mmol/L. Hepatic function showed a mildly decreased albumin at 3.3 g/dL. All other values are unremarkable. An x-ray of her right lower extremity did not show fractures or any osseous pathology.   Review of Systems: As per HPI otherwise 10 point review of systems negative.    Past Medical History:  Diagnosis Date  . Aortic  insufficiency 09/04/2015  . Aortic regurgitation   . Bronchiectasis ([place])   . COPD (chronic obstructive pulmonary disease) ([place])   . DJD (degenerative joint disease)   . HLD (hyperlipidemia)   . HTN (hypertension)   . Mild mitral regurgitation by prior echocardiogram   . Mild mitral regurgitation by prior echocardiogram   . Mild tricuspid regurgitation   . Palpitations    a. has known history of PAC's and PVC's. b. 09/2015: monitor showed sinus rhythm with occasional PAC's and a brief episode of PAT.  [name] Thyroid disease     Past Surgical History:  Procedure Laterality Date  . ABDOMINAL HYSTERECTOMY  1993  . CATARACT EXTRACTION, BILATERAL       reports that she quit smoking about 42 years ago. Her smoking use included Cigarettes. She has a 20.00 pack-year smoking history. She has never used smokeless tobacco. She reports that she does not drink alcohol or use drugs.  Allergies  Allergen Reactions  . Vancomycin     Worsening kidney function  . Sulfa Antibiotics Palpitations    Unknown    Family History  Problem Relation Age of Onset  . Emphysema Mother   . Diabetes Mother   . Cancer Father   . Emphysema Sister   . Emphysema Sister   . Rheumatic fever Sister   . Heart attack Sister   . Diabetes Sister   . Diabetes Brother   . Asthma Brother        as a child    Prior to Admission medications   Medication Sig Start Date End Date Taking? Authorizing Provider  ALPRAZolam Duanne Moron) 0.25 MG tablet Take 0.25  mg by mouth at bedtime.    Yes [provider]  apixaban (ELIQUIS) 2.5 MG TABS tablet Take 1 tablet (2.5 mg total) by mouth 2 (two) times daily. 05/26/17  Yes Lelon Perla, MD  Ascorbic Acid (VITAMIN C) 500 MG tablet Take 500 mg by mouth daily.     Yes [provider]  cephALEXin (KEFLEX) 500 MG capsule Take 1 capsule (500 mg total) by mouth 3 (three) times daily. 06/23/17 07/03/17 Yes Liane Comber, NP  cholecalciferol (VITAMIN D) 1000 UNITS tablet  Take 1,000 Units by mouth daily.    Yes [provider]  diltiazem (CARDIZEM) 90 MG tablet Take 90 mg by mouth 4 (four) times daily. 05/31/17  Yes [provider]  feeding supplement, ENSURE ENLIVE, (ENSURE ENLIVE) LIQD Take 237 mLs by mouth 2 (two) times daily between meals. 04/12/17  Yes Rama, Venetia Maxon, MD  furosemide (LASIX) 20 MG tablet Take 20 mg by mouth daily as needed for fluid.    Yes [provider]  ipratropium-albuterol (DUONEB) 0.5-2.5 (3) MG/3ML SOLN Take 3 mLs by nebulization every 6 (six) hours as needed. 04/25/16  Yes Memon, Jolaine Artist, MD  levothyroxine (SYNTHROID, LEVOTHROID) 50 MCG tablet TAKE 1 TABLET BY MOUTH DAILY OR AS DIRECTED Patient taking differently: TAKE 1 TABLET BY MOUTH DAILY 09/03/16  Yes Unk Pinto, MD  Metoprolol Tartrate 37.5 MG TABS Take 37.5 mg by mouth 2 (two) times daily. 05/31/17  Yes Lelon Perla, MD  diltiazem (CARDIZEM) 60 MG tablet Take 1 tablet (60 mg total) by mouth 4 (four) times daily. Patient not taking: Reported on 06/27/2017 06/03/17   Lelon Perla, MD    Physical Exam: Vitals:   06/27/17 1748 06/27/17 2154  BP: (!) 175/64 (!) 164/63  Pulse: 76 64  Resp: 19 18  Temp: (!) 97.4 F (36.3 C) (!) 97.5 F (36.4 C)  TempSrc: Oral Oral  SpO2: 94% 91%    Constitutional: Frail, underweight, pleasant elderly female in NAD, calm, comfortable Eyes: PERRL, lids and conjunctivae normal ENMT: Mucous membranes are moist. Posterior pharynx clear of any exudate or lesions. Neck: normal, supple, no masses, no thyromegaly Respiratory: clear to auscultation bilaterally, no wheezing, no crackles. Normal respiratory effort. No accessory muscle use.  Cardiovascular: Regular rate and rhythm, no murmurs / rubs / gallops. No extremity edema. 2+ pedal pulses. No carotid bruits.  Abdomen: no tenderness, no masses palpated. No hepatosplenomegaly. Bowel sounds positive.  Musculoskeletal: no clubbing / cyanosis. Good ROM, no  contractures. Normal muscle tone.  Skin: Right pretibial area wound with calor, surrounding erythema and edema. Positive serosanguineous discharge and necrotic skin tissue mostly on the medial area of the wound. Please see images below. Neurologic: CN 2-12 grossly intact. Sensation intact, DTR normal. Strength 5/5 in all 4.  Psychiatric: Normal judgment and insight. Alert and oriented x 4. Normal mood.        Labs on Admission: I have personally reviewed following labs and imaging studies  CBC:  Recent Labs Lab 06/27/17 1611  WBC 10.7*  NEUTROABS 7.6  HGB 10.6*  HCT 34.5*  MCV 96.9  PLT 829   Basic Metabolic Panel:  Recent Labs Lab 06/27/17 1611  NA 137  K 4.3  CL 99*  CO2 32  GLUCOSE 98  BUN 19  CREATININE 0.91  CALCIUM 9.1   GFR: Estimated Creatinine Clearance: 33.1 mL/min (by C-G formula based on SCr of 0.91 mg/dL). Liver Function Tests:  Recent Labs Lab 06/27/17 1611  AST 19  ALT  13*  ALKPHOS 71  BILITOT 0.6  PROT 7.3  ALBUMIN 3.3*   No results for input(s): LIPASE, AMYLASE in the last 168 hours. No results for input(s): AMMONIA in the last 168 hours. Coagulation Profile: No results for input(s): INR, PROTIME in the last 168 hours. Cardiac Enzymes: No results for input(s): CKTOTAL, CKMB, CKMBINDEX, TROPONINI in the last 168 hours. BNP (last 3 results) No results for input(s): PROBNP in the last 8760 hours. HbA1C: No results for input(s): HGBA1C in the last 72 hours. CBG: No results for input(s): GLUCAP in the last 168 hours. Lipid Profile: No results for input(s): CHOL, HDL, LDLCALC, TRIG, CHOLHDL, LDLDIRECT in the last 72 hours. Thyroid Function Tests: No results for input(s): TSH, T4TOTAL, FREET4, T3FREE, THYROIDAB in the last 72 hours. Anemia Panel: No results for input(s): VITAMINB12, FOLATE, FERRITIN, TIBC, IRON, RETICCTPCT in the last 72 hours. Urine analysis:    Component Value Date/Time   COLORURINE YELLOW 03/30/2017 2130    APPEARANCEUR CLEAR 03/30/2017 2130   LABSPEC 1.015 03/30/2017 2130   PHURINE 7.0 03/30/2017 2130   GLUCOSEU NEGATIVE 03/30/2017 2130   HGBUR NEGATIVE 03/30/2017 2130   BILIRUBINUR NEGATIVE 03/30/2017 2130   KETONESUR 5 (A) 03/30/2017 2130   PROTEINUR 30 (A) 03/30/2017 2130   UROBILINOGEN 0.2 08/20/2014 1613   NITRITE NEGATIVE 03/30/2017 2130   LEUKOCYTESUR NEGATIVE 03/30/2017 2130    Radiological Exams on Admission: Dg Tibia/fibula Right  Result Date: 06/27/2017 CLINICAL DATA:  Patient with right lower extremity wound for 1 week. Lower tibia and fibula swelling. Initial encounter. EXAM: RIGHT TIBIA AND FIBULA - 2 VIEW COMPARISON:  None. FINDINGS: Normal anatomic alignment. No evidence for acute fracture or dislocation. Bandaging material overlying the anterior distal lower extremity. IMPRESSION: No acute osseous abnormality. Electronically Signed   By: Lovey Newcomer M.D.   On: 06/27/2017 18:26    04/03/2017 Echocardiogram complete  ------------------------------------------------------------------- LV EF: 60% -   65%  ------------------------------------------------------------------- Indications:      CHF - 428.0.  ------------------------------------------------------------------- History:   PMH:  Atrial tachycardia.  Chronic obstructive pulmonary disease.  PMH:  Mild mitral regurgitation. Aortic insufficiency  Risk factors:  Hypertension. Dyslipidemia.  ------------------------------------------------------------------- Study Conclusions  - Left ventricle: The cavity size was normal. Systolic function was   normal. The estimated ejection fraction was in the range of 60%   to 65%. Wall motion was normal; there were no regional wall   motion abnormalities. - Aortic valve: Mildly calcified annulus. Trileaflet; normal   thickness leaflets. There was mild regurgitation. - Mitral valve: Mildly calcified annulus. There was mild   regurgitation. - Tricuspid valve: There was  moderate regurgitation. - Pulmonary arteries: Systolic pressure was mildly to moderately   increased. PA peak pressure: 42 mm Hg (S).  EKG: Independently reviewed.   Assessment/Plan Principal Problem:   Cellulitis of right lower extremity Admit to MedSurg/inpatient. Ceftriaxone 1 g IV PB every 24 hours. Consult wound care. Consult general surgery in a.m. for further evaluation.  Active Problems:   Essential hypertension Continue Cardizem 90 mg by mouth 4 times a day. Continue metoprolol tartrate 37.5 mg by mouth twice a day. Monitor blood pressure.    COPD with emphysema ([place]) Supplemental oxygen as needed. DuoNeb every 6 hours as needed for bronchospasm.    Hyperlipidemia Not on medical treatment. The patient's LDL is 102 and HDL 104 mg/dL.    Hypothyroidism Continue levothyroxine 50 g by mouth daily. Follow-up TSH as needed.    Pulmonary hypertension due to COPD ([place]) Continue  Cardizem.    AF (paroxysmal atrial fibrillation) ([facility]) CHA?DS?-VASc Score of at least 5. Continue apixaban 2.5 mg by mouth twice a day Continue Cardizem 90 mg by mouth 4 times a day. Continue metoprolol tartrate 37.5 mg by mouth twice a day.    DVT prophylaxis: On Apixaban. Code Status: Full code.  Family Communication: None at bedside. Disposition Plan: Admit for IV antibiotic therapy and wound care. Consults called:  Admission status: Inpatient/MedSurg.   Reubin Milan MD [facility] Pager [phone].  If 7PM-7AM, please contact night-coverage [email or web address] Password [email or web address]  06/27/2017, 11:49 PM

## 2017-06-27 NOTE — ED Triage Notes (Signed)
[facility] called [facility], states pt is being sent here for 24 hr admission and IV abx

## 2017-06-28 ENCOUNTER — Inpatient Hospital Stay (HOSPITAL_COMMUNITY): Payer: Medicare Other

## 2017-06-28 DIAGNOSIS — E038 Other specified hypothyroidism: Secondary | ICD-10-CM

## 2017-06-28 DIAGNOSIS — I2723 Pulmonary hypertension due to lung diseases and hypoxia: Secondary | ICD-10-CM

## 2017-06-28 DIAGNOSIS — L03119 Cellulitis of unspecified part of limb: Secondary | ICD-10-CM

## 2017-06-28 DIAGNOSIS — J9611 Chronic respiratory failure with hypoxia: Secondary | ICD-10-CM

## 2017-06-28 DIAGNOSIS — I4891 Unspecified atrial fibrillation: Secondary | ICD-10-CM

## 2017-06-28 DIAGNOSIS — E782 Mixed hyperlipidemia: Secondary | ICD-10-CM

## 2017-06-28 DIAGNOSIS — I480 Paroxysmal atrial fibrillation: Secondary | ICD-10-CM

## 2017-06-28 DIAGNOSIS — J449 Chronic obstructive pulmonary disease, unspecified: Secondary | ICD-10-CM

## 2017-06-28 DIAGNOSIS — I10 Essential (primary) hypertension: Secondary | ICD-10-CM

## 2017-06-28 DIAGNOSIS — J438 Other emphysema: Secondary | ICD-10-CM

## 2017-06-28 DIAGNOSIS — I2789 Other specified pulmonary heart diseases: Secondary | ICD-10-CM

## 2017-06-28 DIAGNOSIS — L02419 Cutaneous abscess of limb, unspecified: Secondary | ICD-10-CM

## 2017-06-28 LAB — CBC WITH DIFFERENTIAL/PLATELET
BASOS PCT: 0 %
Basophils Absolute: 0 10*3/uL (ref 0.0–0.1)
EOS ABS: 0.1 10*3/uL (ref 0.0–0.7)
Eosinophils Relative: 2 %
HEMATOCRIT: 31.3 % — AB (ref 36.0–46.0)
HEMOGLOBIN: 9.6 g/dL — AB (ref 12.0–15.0)
Lymphocytes Relative: 20 %
Lymphs Abs: 1.5 10*3/uL (ref 0.7–4.0)
MCH: 30.3 pg (ref 26.0–34.0)
MCHC: 30.7 g/dL (ref 30.0–36.0)
MCV: 98.7 fL (ref 78.0–100.0)
MONOS PCT: 12 %
Monocytes Absolute: 0.9 10*3/uL (ref 0.1–1.0)
NEUTROS ABS: 4.8 10*3/uL (ref 1.7–7.7)
NEUTROS PCT: 66 %
Platelets: 217 10*3/uL (ref 150–400)
RBC: 3.17 MIL/uL — AB (ref 3.87–5.11)
RDW: 12.7 % (ref 11.5–15.5)
WBC: 7.3 10*3/uL (ref 4.0–10.5)

## 2017-06-28 LAB — BASIC METABOLIC PANEL
ANION GAP: 6 (ref 5–15)
BUN: 22 mg/dL — ABNORMAL HIGH (ref 6–20)
CALCIUM: 8.5 mg/dL — AB (ref 8.9–10.3)
CHLORIDE: 99 mmol/L — AB (ref 101–111)
CO2: 32 mmol/L (ref 22–32)
Creatinine, Ser: 0.72 mg/dL (ref 0.44–1.00)
GFR calc non Af Amer: >60 mL/min (ref >60)
Glucose, Bld: 88 mg/dL (ref 65–99)
Potassium: 4.1 mmol/L (ref 3.5–5.1)
SODIUM: 137 mmol/L (ref 135–145)

## 2017-06-28 LAB — PROTIME-INR
INR: 1.14
Prothrombin Time: 14.5 seconds (ref 11.4–15.2)

## 2017-06-28 MED ORDER — METOPROLOL TARTRATE 25 MG PO TABS
37.5000 mg | ORAL_TABLET | Freq: Two times a day (BID) | ORAL | Status: DC
  Administered 2017-06-28 – 2017-06-30 (×6): 37.5 mg via ORAL
  Filled 2017-06-28 (×6): qty 2

## 2017-06-28 MED ORDER — LEVOTHYROXINE SODIUM 50 MCG PO TABS
50.0000 ug | ORAL_TABLET | Freq: Every day | ORAL | Status: DC
  Administered 2017-06-28 – 2017-06-30 (×2): 50 ug via ORAL
  Filled 2017-06-28 (×2): qty 1

## 2017-06-28 MED ORDER — CLINDAMYCIN PHOSPHATE 600 MG/50ML IV SOLN
600.0000 mg | Freq: Three times a day (TID) | INTRAVENOUS | Status: DC
  Administered 2017-06-28 – 2017-06-30 (×6): 600 mg via INTRAVENOUS
  Filled 2017-06-28 (×10): qty 50

## 2017-06-28 MED ORDER — IPRATROPIUM-ALBUTEROL 0.5-2.5 (3) MG/3ML IN SOLN: 3.0000 mL | Freq: Four times a day (QID) | RESPIRATORY_TRACT | Status: DC | PRN

## 2017-06-28 MED ORDER — OXYCODONE-ACETAMINOPHEN 5-325 MG PO TABS: 0.5000 | ORAL_TABLET | ORAL | Status: DC | PRN

## 2017-06-28 MED ORDER — CHLORHEXIDINE GLUCONATE CLOTH 2 % EX PADS
6.0000 | MEDICATED_PAD | Freq: Once | CUTANEOUS | Status: AC
  Administered 2017-06-29: 6 via TOPICAL

## 2017-06-28 MED ORDER — ENSURE ENLIVE PO LIQD
237.0000 mL | Freq: Two times a day (BID) | ORAL | Status: DC
  Administered 2017-06-28 – 2017-06-29 (×3): 237 mL via ORAL

## 2017-06-28 MED ORDER — FAMOTIDINE 20 MG PO TABS
20.0000 mg | ORAL_TABLET | Freq: Two times a day (BID) | ORAL | Status: DC
  Administered 2017-06-28 – 2017-06-30 (×6): 20 mg via ORAL
  Filled 2017-06-28 (×6): qty 1

## 2017-06-28 MED ORDER — VITAMIN D 1000 UNITS PO TABS
1000.0000 [IU] | ORAL_TABLET | Freq: Every day | ORAL | Status: DC
  Administered 2017-06-28 – 2017-06-30 (×2): 1000 [IU] via ORAL
  Filled 2017-06-28 (×2): qty 1

## 2017-06-28 MED ORDER — ONDANSETRON HCL 4 MG PO TABS: 4.0000 mg | ORAL_TABLET | Freq: Four times a day (QID) | ORAL | Status: DC | PRN

## 2017-06-28 MED ORDER — CHLORHEXIDINE GLUCONATE CLOTH 2 % EX PADS
6.0000 | MEDICATED_PAD | Freq: Once | CUTANEOUS | Status: AC
  Administered 2017-06-28: 6 via TOPICAL

## 2017-06-28 MED ORDER — ONDANSETRON HCL 4 MG/2ML IJ SOLN: 4.0000 mg | Freq: Four times a day (QID) | INTRAMUSCULAR | Status: DC | PRN

## 2017-06-28 MED ORDER — CEFTRIAXONE SODIUM 1 G IJ SOLR
1.0000 g | INTRAMUSCULAR | Status: DC
  Administered 2017-06-28 – 2017-06-29 (×2): 1 g via INTRAVENOUS
  Filled 2017-06-28 (×4): qty 10

## 2017-06-28 MED ORDER — DILTIAZEM HCL 60 MG PO TABS
90.0000 mg | ORAL_TABLET | Freq: Four times a day (QID) | ORAL | Status: DC
  Administered 2017-06-28 – 2017-06-30 (×8): 90 mg via ORAL
  Filled 2017-06-28 (×8): qty 1

## 2017-06-28 MED ORDER — ALPRAZOLAM 0.25 MG PO TABS
0.2500 mg | ORAL_TABLET | Freq: Every day | ORAL | Status: DC
  Administered 2017-06-28 – 2017-06-30 (×3): 0.25 mg via ORAL
  Filled 2017-06-28 (×3): qty 1

## 2017-06-28 MED ORDER — FUROSEMIDE 20 MG PO TABS: 20.0000 mg | ORAL_TABLET | Freq: Every day | ORAL | Status: DC | PRN

## 2017-06-28 MED ORDER — APIXABAN 2.5 MG PO TABS
2.5000 mg | ORAL_TABLET | Freq: Two times a day (BID) | ORAL | Status: DC
  Administered 2017-06-28: 2.5 mg via ORAL
  Filled 2017-06-28: qty 1

## 2017-06-28 NOTE — Progress Notes (Signed)
Wound care performed. Pt tolerated well.

## 2017-06-28 NOTE — ED Notes (Signed)
Call to 3A.  RN to call me back
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
## 2017-06-28 NOTE — ED Notes (Signed)
Admitting MD is at the bedside 

## 2017-06-28 NOTE — Progress Notes (Signed)
Pt's home meds sent to pharmacy via [facility]. Pt signed home medication form. Home meds consisted of two pill boxes. Medications inside unknown.  Margaret Pyle, RN
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
## 2017-06-29 ENCOUNTER — Encounter (HOSPITAL_COMMUNITY)
Admission: EM | Disposition: A | Discharge status: Home Health | Payer: Self-pay | Source: Home / Self Care | Attending: Internal Medicine

## 2017-06-29 ENCOUNTER — Inpatient Hospital Stay (HOSPITAL_COMMUNITY): Payer: Medicare Other | Admitting: Certified Registered Nurse Anesthetist

## 2017-06-29 ENCOUNTER — Encounter (HOSPITAL_COMMUNITY): Payer: Self-pay | Admitting: *Deleted

## 2017-06-29 DIAGNOSIS — S81801A Unspecified open wound, right lower leg, initial encounter: Secondary | ICD-10-CM

## 2017-06-29 DIAGNOSIS — L089 Local infection of the skin and subcutaneous tissue, unspecified: Secondary | ICD-10-CM

## 2017-06-29 HISTORY — PX: WOUND DEBRIDEMENT: SHX247

## 2017-06-29 LAB — CBC WITH DIFFERENTIAL/PLATELET
Basophils Absolute: 0 10*3/uL (ref 0.0–0.1)
Basophils Relative: 0 %
Eosinophils Absolute: 0.1 10*3/uL (ref 0.0–0.7)
Eosinophils Relative: 1 %
HCT: 31.3 % — ABNORMAL LOW (ref 36.0–46.0)
HEMOGLOBIN: 9.7 g/dL — AB (ref 12.0–15.0)
LYMPHS PCT: 17 %
Lymphs Abs: 1.5 10*3/uL (ref 0.7–4.0)
MCH: 30 pg (ref 26.0–34.0)
MCHC: 31 g/dL (ref 30.0–36.0)
MCV: 96.9 fL (ref 78.0–100.0)
MONOS PCT: 13 %
Monocytes Absolute: 1.1 10*3/uL — ABNORMAL HIGH (ref 0.1–1.0)
NEUTROS ABS: 6.3 10*3/uL (ref 1.7–7.7)
NEUTROS PCT: 69 %
PLATELETS: 231 10*3/uL (ref 150–400)
RBC: 3.23 MIL/uL — ABNORMAL LOW (ref 3.87–5.11)
RDW: 12.6 % (ref 11.5–15.5)
WBC: 9 10*3/uL (ref 4.0–10.5)

## 2017-06-29 SURGERY — DEBRIDEMENT, WOUND
Anesthesia: Monitor Anesthesia Care | Laterality: Right

## 2017-06-29 MED ORDER — PROPOFOL 10 MG/ML IV BOLUS
INTRAVENOUS | Status: AC
  Filled 2017-06-29: qty 20

## 2017-06-29 MED ORDER — OXYCODONE HCL 5 MG PO TABS: 5.0000 mg | ORAL_TABLET | ORAL | Status: DC | PRN

## 2017-06-29 MED ORDER — LIDOCAINE-EPINEPHRINE (PF) 1 %-1:200000 IJ SOLN
INTRAMUSCULAR | Status: DC | PRN
  Administered 2017-06-29: 9 mL

## 2017-06-29 MED ORDER — LIDOCAINE HCL (PF) 1 % IJ SOLN
INTRAMUSCULAR | Status: AC
  Filled 2017-06-29: qty 5

## 2017-06-29 MED ORDER — ZOLPIDEM TARTRATE 5 MG PO TABS
5.0000 mg | ORAL_TABLET | Freq: Once | ORAL | Status: AC
  Administered 2017-06-29: 5 mg via ORAL
  Filled 2017-06-29: qty 1

## 2017-06-29 MED ORDER — ACETAMINOPHEN 500 MG PO TABS
1000.0000 mg | ORAL_TABLET | Freq: Four times a day (QID) | ORAL | Status: DC
  Administered 2017-06-29 – 2017-06-30 (×2): 1000 mg via ORAL
  Filled 2017-06-29 (×2): qty 2

## 2017-06-29 MED ORDER — PROPOFOL 500 MG/50ML IV EMUL
INTRAVENOUS | Status: DC | PRN
  Administered 2017-06-29: 50 ug/kg/min via INTRAVENOUS

## 2017-06-29 MED ORDER — LIDOCAINE HCL (PF) 1 % IJ SOLN
INTRAMUSCULAR | Status: DC | PRN
  Administered 2017-06-29: 20 mg

## 2017-06-29 MED ORDER — MIDAZOLAM HCL 2 MG/2ML IJ SOLN
INTRAMUSCULAR | Status: AC
  Filled 2017-06-29: qty 2

## 2017-06-29 MED ORDER — PHENYLEPHRINE 40 MCG/ML (10ML) SYRINGE FOR IV PUSH (FOR BLOOD PRESSURE SUPPORT)
PREFILLED_SYRINGE | INTRAVENOUS | Status: AC
  Filled 2017-06-29: qty 10

## 2017-06-29 MED ORDER — LIDOCAINE-EPINEPHRINE (PF) 1 %-1:200000 IJ SOLN
INTRAMUSCULAR | Status: AC
  Filled 2017-06-29: qty 30

## 2017-06-29 MED ORDER — TRAMADOL HCL 50 MG PO TABS: 50.0000 mg | ORAL_TABLET | Freq: Four times a day (QID) | ORAL | Status: DC | PRN

## 2017-06-29 MED ORDER — MIDAZOLAM HCL 2 MG/2ML IJ SOLN
1.0000 mg | INTRAMUSCULAR | Status: AC
  Administered 2017-06-29: 1 mg via INTRAVENOUS

## 2017-06-29 MED ORDER — LACTATED RINGERS IV SOLN
INTRAVENOUS | Status: DC
  Administered 2017-06-29: 10:00:00 via INTRAVENOUS

## 2017-06-29 MED ORDER — MORPHINE SULFATE (PF) 2 MG/ML IV SOLN: 1.0000 mg | INTRAVENOUS | Status: DC | PRN

## 2017-06-29 MED ORDER — SODIUM CHLORIDE 0.9 % IR SOLN
Status: DC | PRN
  Administered 2017-06-29: 1000 mL

## 2017-06-29 MED ORDER — FENTANYL CITRATE (PF) 100 MCG/2ML IJ SOLN: 25.0000 ug | INTRAMUSCULAR | Status: DC | PRN

## 2017-06-29 SURGICAL SUPPLY — 32 items
BAG HAMPER (MISCELLANEOUS) ×2 IMPLANT
BNDG GAUZE ELAST 4 BULKY (GAUZE/BANDAGES/DRESSINGS) ×2 IMPLANT
CLOTH BEACON ORANGE TIMEOUT ST (SAFETY) ×2 IMPLANT
COVER LIGHT HANDLE STERIS (MISCELLANEOUS) ×4 IMPLANT
DECANTER SPIKE VIAL GLASS SM (MISCELLANEOUS) ×2 IMPLANT
ELECT REM PT RETURN 9FT ADLT (ELECTROSURGICAL) ×2
ELECTRODE REM PT RTRN 9FT ADLT (ELECTROSURGICAL) ×1 IMPLANT
GAUZE SPONGE 4X4 12PLY STRL (GAUZE/BANDAGES/DRESSINGS) IMPLANT
GAUZE XEROFORM 5X9 LF (GAUZE/BANDAGES/DRESSINGS) ×2 IMPLANT
GLOVE BIO SURGEON STRL SZ 6.5 (GLOVE) ×2 IMPLANT
GLOVE BIOGEL PI IND STRL 6.5 (GLOVE) ×1 IMPLANT
GLOVE BIOGEL PI IND STRL 7.0 (GLOVE) ×1 IMPLANT
GLOVE BIOGEL PI IND STRL 7.5 (GLOVE) ×1 IMPLANT
GLOVE BIOGEL PI INDICATOR 6.5 (GLOVE) ×1
GLOVE BIOGEL PI INDICATOR 7.0 (GLOVE) ×1
GLOVE BIOGEL PI INDICATOR 7.5 (GLOVE) ×1
GLOVE ECLIPSE 6.5 STRL STRAW (GLOVE) ×2 IMPLANT
GLOVE SURG SS PI 7.5 STRL IVOR (GLOVE) ×4 IMPLANT
GOWN STRL REUS W/TWL LRG LVL3 (GOWN DISPOSABLE) ×4 IMPLANT
KIT ROOM TURNOVER APOR (KITS) ×2 IMPLANT
MANIFOLD NEPTUNE II (INSTRUMENTS) ×2 IMPLANT
NEEDLE HYPO 25X1 1.5 SAFETY (NEEDLE) ×2 IMPLANT
NS IRRIG 1000ML POUR BTL (IV SOLUTION) ×2 IMPLANT
PACK BASIC LIMB (CUSTOM PROCEDURE TRAY) ×2 IMPLANT
PAD ABD 5X9 TENDERSORB (GAUZE/BANDAGES/DRESSINGS) ×2 IMPLANT
PAD ARMBOARD 7.5X6 YLW CONV (MISCELLANEOUS) ×2 IMPLANT
SET BASIN LINEN APH (SET/KITS/TRAYS/PACK) ×2 IMPLANT
SPONGE LAP 18X18 X RAY DECT (DISPOSABLE) ×2 IMPLANT
SYR BULB IRRIGATION 50ML (SYRINGE) ×2 IMPLANT
SYR CONTROL 10ML LL (SYRINGE) ×2 IMPLANT
TAPE SURG TRANSPORE 1 IN (GAUZE/BANDAGES/DRESSINGS) ×1 IMPLANT
TAPE SURGICAL TRANSPORE 1 IN (GAUZE/BANDAGES/DRESSINGS) ×1

## 2017-06-29 NOTE — Progress Notes (Signed)
PROGRESS NOTE    Andrea [name]  CLT:[id number] DOB: 08/15/1929 DOA: 06/27/2017 PCP: Unk Pinto, MD     Brief Narrative:  83 year old man admitted from home on 10/1 due to her right lower extremity wound. Has a history of aortic insufficiency, COPD, diastolic CHF, hyperlipidemia, hypertension, chronic atrial fibrillation on Eliquis, hypothyroidism. Was referred to the hospital by surgery for inpatient treatment of her right lower extremity wound that she sustained last week. She initially went to [facility] ED but due to the long wait and came to [facility] EDD. She was seen in consultation by surgery, Dr. Constance Haw, who performed an operative I and D today with debridement of necrotic tissue.   Assessment & Plan:   Principal Problem:   Cellulitis of right lower extremity Active Problems:   Essential hypertension   COPD with emphysema (HCC)   Hyperlipidemia   Hypothyroidism   Pulmonary hypertension due to COPD (HCC)   Chronic respiratory failure with hypoxia (HCC)   AF (paroxysmal atrial fibrillation) (HCC)   Protein-calorie malnutrition, severe (HCC)   Traumatic open wound of right lower leg with infection   Right leg wound with necrosis and cellulitis -Status post I and D by Dr. Constance Haw today with debridement of necrotic tissue. -Plan to continue clindamycin as he has shown some improvement. Unsure why he is on Rocephin, will discontinue.  Atrial fibrillation -Rate controlled, currently in sinus rhythm, continue Eliquis for anticoagulation and diltiazem and metoprolol for rate control.  Chronic diastolic CHF -Compensated at present. Continue metoprolol.  Hypothyroidism -Continue Synthroid  Hypertension -Well-controlled, continue metoprolol and diltiazem   DVT prophylaxis: Eliquis Code Status: Full code Family Communication: Patient only Disposition Plan: Likely discharge home in 48-72 hours  Consultants:   Surgery, Dr. Constance Haw  Procedures:   Surgical  debridement of necrotic tissue on 10/3  Antimicrobials:  Anti-infectives    Start     Dose/Rate Route Frequency Ordered Stop   06/28/17 1100  clindamycin (CLEOCIN) IVPB 600 mg     600 mg 100 mL/hr over 30 Minutes Intravenous Every 8 hours 06/28/17 0926     06/28/17 0115  cefTRIAXone (ROCEPHIN) 1 g in dextrose 5 % 50 mL IVPB     1 g 100 mL/hr over 30 Minutes Intravenous Every 24 hours 06/28/17 0111     06/27/17 2215  clindamycin (CLEOCIN) IVPB 600 mg     600 mg 100 mL/hr over 30 Minutes Intravenous  Once 06/27/17 2214 06/27/17 2318       Subjective: Feels okay, some pain postop  Objective: Vitals:   06/29/17 1200 06/29/17 1215 06/29/17 1231 06/29/17 1509  BP: (!) 160/69 126/65 (!) 152/55 (!) 104/47  Pulse: 66 69 69 (!) 55  Resp: _0 Temp:   (!) 97.5 F (36.4 C) 98 F (36.7 C)  TempSrc:   Oral Oral  SpO2: 99% 100% 96% 92%  Weight:      Height:        Intake/Output Summary (Last 24 hours) at 06/29/17 1801 Last data filed at 06/29/17 1452  Gross per 24 hour  Intake              770 ml  Output                5 ml  Net              765 ml   Filed Weights   06/28/17 0100 06/29/17 0539  Weight: 46.5 kg (102 lb 8.2 oz)  46.5 kg (102 lb 8.6 oz)    Examination:  General exam: Alert, awake, oriented x 3 Respiratory system: Clear to auscultation. Respiratory effort normal. Cardiovascular system:RRR. No murmurs, rubs, gallops. Gastrointestinal system: Abdomen is nondistended, soft and nontender. No organomegaly or masses felt. Normal bowel sounds heard. Central nervous system: Alert and oriented. No focal neurological deficits. Extremities: Right foot wrapped in clean dressing. Skin: No rashes, lesions or ulcers Psychiatry: Judgement and insight appear normal. Mood & affect appropriate.     Data Reviewed: I have personally reviewed following labs and imaging studies  CBC:  Recent Labs Lab 06/27/17 1611 06/28/17 0510 06/29/17 0429  WBC 10.7* 7.3 9.0    NEUTROABS 7.6 4.8 6.3  HGB 10.6* 9.6* 9.7*  HCT 34.5* 31.3* 31.3*  MCV 96.9 98.7 96.9  PLT 267 217 371   Basic Metabolic Panel:  Recent Labs Lab 06/27/17 1611 06/28/17 0510  NA 137 137  K 4.3 4.1  CL 99* 99*  CO2 32 32  GLUCOSE 98 88  BUN 19 22*  CREATININE 0.91 0.72  CALCIUM 9.1 8.5*   GFR: Estimated Creatinine Clearance: 37.1 mL/min (by C-G formula based on SCr of 0.72 mg/dL). Liver Function Tests:  Recent Labs Lab 06/27/17 1611  AST 19  ALT 13*  ALKPHOS 71  BILITOT 0.6  PROT 7.3  ALBUMIN 3.3*   No results for input(s): LIPASE, AMYLASE in the last 168 hours. No results for input(s): AMMONIA in the last 168 hours. Coagulation Profile:  Recent Labs Lab 06/28/17 1427  INR 1.14   Cardiac Enzymes: No results for input(s): CKTOTAL, CKMB, CKMBINDEX, TROPONINI in the last 168 hours. BNP (last 3 results) No results for input(s): PROBNP in the last 8760 hours. HbA1C: No results for input(s): HGBA1C in the last 72 hours. CBG: No results for input(s): GLUCAP in the last 168 hours. Lipid Profile: No results for input(s): CHOL, HDL, LDLCALC, TRIG, CHOLHDL, LDLDIRECT in the last 72 hours. Thyroid Function Tests: No results for input(s): TSH, T4TOTAL, FREET4, T3FREE, THYROIDAB in the last 72 hours. Anemia Panel: No results for input(s): VITAMINB12, FOLATE, FERRITIN, TIBC, IRON, RETICCTPCT in the last 72 hours. Urine analysis:    Component Value Date/Time   COLORURINE YELLOW 03/30/2017 2130   APPEARANCEUR CLEAR 03/30/2017 2130   LABSPEC 1.015 03/30/2017 2130   PHURINE 7.0 03/30/2017 2130   GLUCOSEU NEGATIVE 03/30/2017 2130   HGBUR NEGATIVE 03/30/2017 2130   BILIRUBINUR NEGATIVE 03/30/2017 2130   KETONESUR 5 (A) 03/30/2017 2130   PROTEINUR 30 (A) 03/30/2017 2130   UROBILINOGEN 0.2 08/20/2014 1613   NITRITE NEGATIVE 03/30/2017 2130   LEUKOCYTESUR NEGATIVE 03/30/2017 2130   Sepsis Labs: _0 (procalcitonin:4,lacticidven:4)  )No results found for this  or any previous visit (from the past 240 hour(s)).       Radiology Studies: Chest 2 View  Result Date: 06/28/2017 CLINICAL DATA:  Preoperative x-ray. EXAM: CHEST  2 VIEW COMPARISON:  Chest x-ray dated April 07, 2017. FINDINGS: The cardiomediastinal silhouette is normal in size. Atherosclerotic calcification of the aortic arch. Mild pulmonary vascular congestion. Hyperinflated lungs with scattered pleuroparenchymal scarring and prominent emphysematous changes. Unchanged small left-greater-than-right pleural effusions. Left basilar airspace opacity appears slightly improved. No pneumothorax. No acute osseous abnormality. IMPRESSION: 1. Severe COPD. Improving airspace disease in the left lower lobe, which may reflect resolving pneumonia or atelectasis. 2. Unchanged mild pulmonary vascular congestion and small left greater than right pleural effusions. No overt edema. Electronically Signed   By: Titus Dubin M.D.   On: 06/28/2017 15:37  Dg Tibia/fibula Right  Result Date: 06/27/2017 CLINICAL DATA:  Patient with right lower extremity wound for 1 week. Lower tibia and fibula swelling. Initial encounter. EXAM: RIGHT TIBIA AND FIBULA - 2 VIEW COMPARISON:  None. FINDINGS: Normal anatomic alignment. No evidence for acute fracture or dislocation. Bandaging material overlying the anterior distal lower extremity. IMPRESSION: No acute osseous abnormality. Electronically Signed   By: Lovey Newcomer M.D.   On: 06/27/2017 18:26        Scheduled Meds: . acetaminophen  1,000 mg Oral Q6H  . ALPRAZolam  0.25 mg Oral QHS  . cholecalciferol  1,000 Units Oral Daily  . diltiazem  90 mg Oral QID  . famotidine  20 mg Oral BID  . feeding supplement (ENSURE ENLIVE)  237 mL Oral BID BM  . levothyroxine  50 mcg Oral QAC breakfast  . metoprolol tartrate  37.5 mg Oral BID   Continuous Infusions: . cefTRIAXone (ROCEPHIN)  IV Stopped (06/29/17 0252)  . clindamycin (CLEOCIN) IV 600 mg (06/29/17 1713)     LOS: 2 days      Time spent: 35 minutes. Greater than 50% of this time was spent in direct contact with the patient coordinating care.     Lelon Frohlich, MD [facility] Pager [phone]  If 7PM-7AM, please contact night-coverage [email or web address] Password [email or web address] 06/29/2017, 6:01 PM

## 2017-06-29 NOTE — H&P (View-Only) (Signed)
Andrea [name]  Reason for Consult: Right leg wound, cellulitis  Referring Physician: Dr. Carles Collet  Chief Complaint    Wound Infection      Andrea [name] is a 83 y.o. female.  HPI: Andrea [name] is a very pleasant 83 yo with multiple medical issues including A Fib, COPD, on home O2 at night.  She is on Eliquis for the A fib, and reports that she got a laceration on her leg over 1 week ago while getting out of the car.  She was seen and placed on antibiotics, but this has continued to worsen with cellulitis extending around her wound.  She was seen by [facility] and told to go to the ED for IV antibiotics for the cellulitis.  No debridement was felt to need to be immediate when they saw the patient.   Past Medical History:  Diagnosis Date  . Aortic insufficiency 09/04/2015  . Aortic regurgitation   . Bronchiectasis ([place])   . COPD (chronic obstructive pulmonary disease) ([place])   . DJD (degenerative joint disease)   . HLD (hyperlipidemia)   . HTN (hypertension)   . Mild mitral regurgitation by prior echocardiogram   . Mild mitral regurgitation by prior echocardiogram   . Mild tricuspid regurgitation   . Palpitations    a. has known history of PAC's and PVC's. b. 09/2015: monitor showed sinus rhythm with occasional PAC's and a brief episode of PAT.  [name] Thyroid disease     Past Surgical History:  Procedure Laterality Date  . ABDOMINAL HYSTERECTOMY  1993  . CATARACT EXTRACTION, BILATERAL      Family History  Problem Relation Age of Onset  . Emphysema Mother   . Diabetes Mother   . Cancer Father   . Emphysema Sister   . Emphysema Sister   . Rheumatic fever Sister   . Heart attack Sister   . Diabetes Sister   . Diabetes Brother   . Asthma Brother        as a child    Social History  Substance Use Topics  . Smoking status: Former Smoker    Packs/day: 1.00    Years: 20.00    Types: Cigarettes    Quit date: 09/27/1974  . Smokeless tobacco: Never Used   . Alcohol use No    Medications:  I have reviewed the patient's current medications. Scheduled: . ALPRAZolam  0.25 mg Oral QHS  . apixaban  2.5 mg Oral BID  . cholecalciferol  1,000 Units Oral Daily  . diltiazem  90 mg Oral QID  . famotidine  20 mg Oral BID  . feeding supplement (ENSURE ENLIVE)  237 mL Oral BID BM  . levothyroxine  50 mcg Oral QAC breakfast  . metoprolol tartrate  37.5 mg Oral BID   Continuous: . cefTRIAXone (ROCEPHIN)  IV Stopped (06/28/17 0254)  . clindamycin (CLEOCIN) IV Stopped (06/28/17 1140)   HFW:YOVZCHYIFO, ipratropium-albuterol, ondansetron **OR** ondansetron (ZOFRAN) IV, oxyCODONE-acetaminophen   Allergies  Allergen Reactions  . Vancomycin     Worsening kidney function  . Sulfa Antibiotics Palpitations    Unknown    ROS:  A comprehensive review of systems was negative except for: Musculoskeletal: positive for erthyema and swelling, drainage right leg  Blood pressure (!) 122/59, pulse 64, temperature 98.2 F (36.8 C), temperature source Oral, resp. rate 18, height _0  (1.6 m), weight 102 lb 8.2 oz (46.5 kg), SpO2 92 %. Physical Exam  Constitutional: She is oriented to person, place, and time and  well-developed, well-nourished, and in no distress. She appears cachectic.  HENT:  Head: Normocephalic.  Cardiovascular: Normal rate and regular rhythm.   Pulmonary/Chest: Effort normal and breath sounds normal.  Abdominal: Soft. Bowel sounds are normal. She exhibits no distension. There is no tenderness.  Musculoskeletal: Normal range of motion.  Neurological: She is alert and oriented to person, place, and time.  Skin: Skin is warm. There is erythema.  Right shin wound, measuring 10X8cm, necrotic tissue, areas of hypertrophy centrally with various depths of wound  Psychiatric: Mood, memory, affect and judgment normal.  Vitals reviewed.   Results: Results for orders placed or performed during the hospital encounter of 06/27/17 (from the past 48  hour(s))  Basic metabolic panel     Status: Abnormal   Collection Time: 06/28/17  5:10 AM  Result Value Ref Range   Sodium 137 135 - 145 mmol/L   Potassium 4.1 3.5 - 5.1 mmol/L   Chloride 99 (L) 101 - 111 mmol/L   CO2 32 22 - 32 mmol/L   Glucose, Bld 88 65 - 99 mg/dL   BUN 22 (H) 6 - 20 mg/dL   Creatinine, Ser 0.72 0.44 - 1.00 mg/dL   Calcium 8.5 (L) 8.9 - 10.3 mg/dL   GFR calc non Af Amer >60 >60 mL/min   GFR calc Af Amer >60 >60 mL/min    Comment: (NOTE) The eGFR has been calculated using the CKD EPI equation. This calculation has not been validated in all clinical situations. eGFR's persistently <60 mL/min signify possible Chronic Kidney Disease.    Anion gap 6 5 - 15  CBC WITH DIFFERENTIAL     Status: Abnormal   Collection Time: 06/28/17  5:10 AM  Result Value Ref Range   WBC 7.3 4.0 - 10.5 K/uL   RBC 3.17 (L) 3.87 - 5.11 MIL/uL   Hemoglobin 9.6 (L) 12.0 - 15.0 g/dL   HCT 31.3 (L) 36.0 - 46.0 %   MCV 98.7 78.0 - 100.0 fL   MCH 30.3 26.0 - 34.0 pg   MCHC 30.7 30.0 - 36.0 g/dL   RDW 12.7 11.5 - 15.5 %   Platelets 217 150 - 400 K/uL   Neutrophils Relative % 66 %   Neutro Abs 4.8 1.7 - 7.7 K/uL   Lymphocytes Relative 20 %   Lymphs Abs 1.5 0.7 - 4.0 K/uL   Monocytes Relative 12 %   Monocytes Absolute 0.9 0.1 - 1.0 K/uL   Eosinophils Relative 2 %   Eosinophils Absolute 0.1 0.0 - 0.7 K/uL   Basophils Relative 0 %   Basophils Absolute 0.0 0.0 - 0.1 K/uL    Dg Tibia/fibula Right  Result Date: 06/27/2017 CLINICAL DATA:  Patient with right lower extremity wound for 1 week. Lower tibia and fibula swelling. Initial encounter. EXAM: RIGHT TIBIA AND FIBULA - 2 VIEW COMPARISON:  None. FINDINGS: Normal anatomic alignment. No evidence for acute fracture or dislocation. Bandaging material overlying the anterior distal lower extremity. IMPRESSION: No acute osseous abnormality. Electronically Signed   By: Drew  Davis M.D.   On: 06/27/2017 18:26     Assessment & Plan:  Andrea [name] is a 83 y.o. female with a right leg wound over the shin with necrotic areas, cellulitis improving since her antibiotics were started.  Given duration of the wound and cellulitis believe that it needs some local debridement to get to clean base and assess depth.  If concern for depth to bone, will likely need MRI, but at this time no   evidence of this extension.   I think this wound will be quicker to heal and easier to manage with debridement.  - OR tomorrow for debridement under MAC, have spoken with Dr. Patsey Berthold and discussed if need deeper debridement will have to wait 48 hrs for spinal anesthesia given Eliquis - NPO midnight, CXR and EKG ordered, coags ordered  - Hold eliquis - Updated Dr. Carles Collet.   All questions were answered to the satisfaction of the patient.  The risk and benefits of right leg debridement were discussed including but not limited to bleeding, need for further debridement.  After careful consideration, JENNINE PEDDY has decided to proceed with debridement.    [name] 06/28/2017, 1:31 PM

## 2017-06-29 NOTE — Interval H&P Note (Signed)
History and Physical Interval Note:  06/29/2017 10:27 AM  Andrea [name]  has presented today for surgery, with the diagnosis of necrotic right leg wound  The various methods of treatment have been discussed with the patient and family. After consideration of risks, benefits and other options for treatment, the patient has consented to  Procedure(s): RIGHT LEG DEBRIDEMENT (Right) as a surgical intervention .  The patient's history has been reviewed, patient examined, no change in status, stable for surgery.  I have reviewed the patient's chart and labs.  Questions were answered to the patient's satisfaction.     [name]

## 2017-06-29 NOTE — Anesthesia Preprocedure Evaluation (Signed)
Anesthesia Evaluation  Patient identified by MRN, date of birth, ID band Patient awake    Reviewed: Allergy & Precautions, NPO status , Patient's Chart, lab work & pertinent test results  Airway Mallampati: I  TM Distance: >3 FB     Dental  (+) Teeth Intact   Pulmonary COPD (emphysema), former smoker,    breath sounds clear to auscultation       Cardiovascular hypertension, +CHF  + dysrhythmias Atrial Fibrillation + Valvular Problems/Murmurs AI and MR  Rhythm:Regular Rate:Normal     Neuro/Psych    GI/Hepatic negative GI ROS,   Endo/Other  Hypothyroidism   Renal/GU Renal disease     Musculoskeletal   Abdominal   Peds  Hematology   Anesthesia Other Findings   Reproductive/Obstetrics                             Anesthesia Physical Anesthesia Plan  ASA: III  Anesthesia Plan: MAC   Post-op Pain Management:    Induction: Intravenous  PONV Risk Score and Plan:   Airway Management Planned: Natural Airway and Simple Face Mask  Additional Equipment:   Intra-op Plan:   Post-operative Plan:   Informed Consent: I have reviewed the patients History and Physical, chart, labs and discussed the procedure including the risks, benefits and alternatives for the proposed anesthesia with the patient or authorized representative who has indicated his/her understanding and acceptance.     Plan Discussed with:   Anesthesia Plan Comments:         Anesthesia Quick Evaluation

## 2017-06-29 NOTE — Progress Notes (Signed)
Pt fell. Vitals stable. MD notified. No witness. Pt found on floor.

## 2017-06-29 NOTE — Op Note (Addendum)
[facility] Operative Note  06/29/17  Preoperative Diagnosis: Traumatic right leg necrotic infected wound   Postoperative Diagnosis: Same   Procedure(s) Performed: Sharp debridement of right leg wound, 8X5X0.5 cm superficial (40 sq cm)    Surgeon: Lanell Matar. Constance Haw, MD   Assistants: No qualified resident was available   Anesthesia: Monitored Anesthesia Care   Anesthesiologist: Dr. Patsey Berthold, MD    Specimens: None   Estimated Blood Loss: Minimal   Blood Replacement: None   Wound Class: Dirty   Complications: None    Operative Indications: Andrea [name] is a 83 yo with multiple medical co-morbidities on Eliquis for A fib, who presented with right leg cellulitis and wound on the anterior shin after a fall from a car where she hit her leg and cut the area. She bled from this wound and went to urgent care where she was given antibiotics and attempted closure.  The wound continued to worse and the cellulitis increased.  She was admitted to the hospital for IV antibiotics, and surgery was called for evaluation. Given the extent of the cellulitis and the location of the wound with unknown depth, it was recommended that the patient's wound get debridement to aid in the healing process and identify if the bone or periosteum was violated.  The patient agreed and decided to proceed with the surgery after the risk and benefits were discussed.  We held her Eliquis so the operation would be 24 hours after her prior dose.    Findings: Right leg wound, measuring 8X5X0.5 cm with areas of necrotic adipose tissue and necrotic skin.  No periosteum or bone exposed.   Procedure: The patient was taken to the operating room and placed supine. Monitored anesthesia care was induced. Intravenous antibiotics were not administered as the patient is on IV antibiotics scheduled.   The right leg was prepared and draped in the usual sterile fashion.   The area was infiltrated with 1% lidocaine with  epinephrine, and the wound was cleaned with a scrub brush and saline.  There was areas of necrotic skin and adipose tissue at the base of the wound that was debrided with a scalpel sharply.  The edges of the wound were cleaned up with sharp debridement with the scissors.  The area was inspected and no bone was exposed.  Areas of bleeding were cauterized.  There was healthy adipose and subcutaneous tissue in the final wound bed.    Final inspection revealed acceptable hemostasis. All counts were correct at the end of the case.  A xeroform dressing with ABD pad and kerilix was applied. The patient was awakened from anesthesia and taken to PACU in stable condition.    Curlene Labrum, MD [facility] [address] [place], [place] [address] [phone] (office)

## 2017-06-29 NOTE — Transfer of Care (Signed)
Immediate Anesthesia Transfer of Care Note  Patient: Andrea [name]  Procedure(s) Performed: RIGHT LOWER  LEG DEBRIDEMENT (Right )  Patient Location: PACU  Anesthesia Type:MAC  Level of Consciousness: awake, alert , oriented and patient cooperative  Airway & Oxygen Therapy: Patient Spontanous Breathing and Patient connected to nasal cannula oxygen  Post-op Assessment: Report given to RN and Post -op Vital signs reviewed and stable  Post vital signs: Reviewed and stable  Last Vitals:  Vitals:   06/29/17 0539 06/29/17 1028  BP: 138/60 (!) 163/68  Pulse: 60   Resp: 16 (!) 27  Temp: 36.8 C 36.5 C  SpO2: 91% 98%    Last Pain:  Vitals:   06/29/17 1028  TempSrc: Oral  PainSc:       Patients Stated Pain Goal: 8 (33/43/56 8616)  Complications: No apparent anesthesia complications

## 2017-06-30 ENCOUNTER — Encounter (HOSPITAL_COMMUNITY): Payer: Self-pay | Admitting: General Surgery

## 2017-06-30 DIAGNOSIS — Z23 Encounter for immunization: Secondary | ICD-10-CM | POA: Diagnosis not present

## 2017-06-30 LAB — CBC WITH DIFFERENTIAL/PLATELET
BASOS PCT: 0 %
Basophils Absolute: 0 10*3/uL (ref 0.0–0.1)
Eosinophils Absolute: 0.1 10*3/uL (ref 0.0–0.7)
Eosinophils Relative: 2 %
HCT: 33.6 % — ABNORMAL LOW (ref 36.0–46.0)
HEMOGLOBIN: 10.7 g/dL — AB (ref 12.0–15.0)
Lymphocytes Relative: 22 %
Lymphs Abs: 1.6 10*3/uL (ref 0.7–4.0)
MCH: 30.6 pg (ref 26.0–34.0)
MCHC: 31.8 g/dL (ref 30.0–36.0)
MCV: 96 fL (ref 78.0–100.0)
MONOS PCT: 10 %
Monocytes Absolute: 0.7 10*3/uL (ref 0.1–1.0)
NEUTROS ABS: 4.9 10*3/uL (ref 1.7–7.7)
NEUTROS PCT: 66 %
Platelets: 262 10*3/uL (ref 150–400)
RBC: 3.5 MIL/uL — AB (ref 3.87–5.11)
RDW: 13.3 % (ref 11.5–15.5)
WBC: 7.3 10*3/uL (ref 4.0–10.5)

## 2017-06-30 MED ORDER — INFLUENZA VAC SPLIT HIGH-DOSE 0.5 ML IM SUSY
0.5000 mL | PREFILLED_SYRINGE | INTRAMUSCULAR | Status: AC
  Administered 2017-06-30: 0.5 mL via INTRAMUSCULAR
  Filled 2017-06-30: qty 0.5

## 2017-06-30 MED ORDER — CLINDAMYCIN HCL 300 MG PO CAPS: 300.0000 mg | ORAL_CAPSULE | Freq: Three times a day (TID) | ORAL | 0 refills | Status: DC

## 2017-06-30 NOTE — Care Management Note (Signed)
Case Management Note  Patient Details  Name: Andrea [name] MRN: [id number] Date of Birth: 1928-12-26     Expected Discharge Date:       06/30/2017           Expected Discharge Plan:  [place]  In-House Referral:     Discharge planning Services  CM Consult  Post Acute Care Choice:  Home Health Choice offered to:  Patient  DME Arranged:    DME Agency:     HH Arranged:  RN [place] Agency:  [facility]  Status of Service:  Completed, signed off  If discussed at [place] of Stay Meetings, dates discussed:    Additional Comments: Patient discharging home today. Will need home health for help with wound dressing changes. Discussed with patient, offered choice of home health agencies. She would like [facility]. Discussed with patient that [facility] is usually 3 times a week, not daily and not multiple visits per day. Family will need to learn and assist with dressing changes. She does have 2 daughters, one lives next door. Patient did state "that it will not be a catastrophe if it only gets changed once a day". RN updated.  Mercedies Ganesh, Chauncey [place], RN 06/30/2017, 11:01 AM

## 2017-06-30 NOTE — Progress Notes (Signed)
Andrea [name] discharged Home per MD order.  Discharge instructions reviewed and discussed with the patient, all questions and concerns answered. Copy of instructions and scripts given to patient. Taught pt and daughter how to change pt's dressing to right lower leg. Understanding verbalized.  Allergies as of 06/30/2017      Reactions   Vancomycin    Worsening kidney function   Sulfa Antibiotics Palpitations   Unknown      Medication List    STOP taking these medications   cephALEXin 500 MG capsule Commonly known as:  KEFLEX     TAKE these medications   ALPRAZolam 0.25 MG tablet Commonly known as:  XANAX Take 0.25 mg by mouth at bedtime.   apixaban 2.5 MG Tabs tablet Commonly known as:  ELIQUIS Take 1 tablet (2.5 mg total) by mouth 2 (two) times daily.   cholecalciferol 1000 units tablet Commonly known as:  VITAMIN D Take 1,000 Units by mouth daily.   clindamycin 300 MG capsule Commonly known as:  CLEOCIN Take 1 capsule (300 mg total) by mouth 3 (three) times daily.   diltiazem 90 MG tablet Commonly known as:  CARDIZEM Take 90 mg by mouth 4 (four) times daily. What changed:  Another medication with the same name was removed. Continue taking this medication, and follow the directions you see here.   feeding supplement (ENSURE ENLIVE) Liqd Take 237 mLs by mouth 2 (two) times daily between meals.   furosemide 20 MG tablet Commonly known as:  LASIX Take 20 mg by mouth daily as needed for fluid.   ipratropium-albuterol 0.5-2.5 (3) MG/3ML Soln Commonly known as:  DUONEB Take 3 mLs by nebulization every 6 (six) hours as needed.   levothyroxine 50 MCG tablet Commonly known as:  SYNTHROID, LEVOTHROID TAKE 1 TABLET BY MOUTH DAILY OR AS DIRECTED What changed:  See the new instructions.   Metoprolol Tartrate 37.5 MG Tabs Take 37.5 mg by mouth 2 (two) times daily.   vitamin C 500 MG tablet Commonly known as:  ASCORBIC ACID Take 500 mg by mouth daily.       Patients  skin is clean, dry and intact, no evidence of skin break down. IV site discontinued and catheter remains intact. Site without signs and symptoms of complications. Dressing and pressure applied.  Patient escorted to car in a wheelchair,  no distress noted upon discharge.  Andrea [name] 06/30/2017 5:22 PM

## 2017-06-30 NOTE — Anesthesia Postprocedure Evaluation (Signed)
Anesthesia Post Note  Patient: Andrea [name]  Procedure(s) Performed: RIGHT LOWER  LEG DEBRIDEMENT (Right )  Patient location during evaluation: Nursing Unit Anesthesia Type: MAC Level of consciousness: awake and alert, oriented and patient cooperative Pain management: pain level controlled Vital Signs Assessment: post-procedure vital signs reviewed and stable Respiratory status: spontaneous breathing, nonlabored ventilation and respiratory function stable Cardiovascular status: blood pressure returned to baseline Postop Assessment: no apparent nausea or vomiting Anesthetic complications: no     Last Vitals:  Vitals:   06/29/17 2139 06/30/17 0410  BP: (!) 145/53 (!) 124/52  Pulse: 76 68  Resp: 20 20  Temp: 36.5 C 36.8 C  SpO2: 94% 97%    Last Pain:  Vitals:   06/30/17 0410  TempSrc: Oral  PainSc:                  Kaz Auld J

## 2017-06-30 NOTE — Progress Notes (Signed)
[facility] Progress Note  1 Day Post-Op  Subjective: Doing well. No complaints. Ate breakfast. Worried about the dressing changes needing to be twice daily.   Objective: Vital signs in last 24 hours: Temp:  [97.5 F (36.4 C)-98.2 F (36.8 C)] 98.2 F (36.8 C) (10/04 0410) Pulse Rate:  [55-76] 68 (10/04 0410) Resp:  [16-27] 20 (10/04 0410) BP: (104-163)/(47-69) 124/52 (10/04 0410) SpO2:  [92 %-100 %] 97 % (10/04 0410) Weight:  [104 lb (47.2 kg)] 104 lb (47.2 kg) (10/04 0500) Last BM Date: 06/28/17  Intake/Output from previous day: 10/03 0701 - 10/04 0700 In: 860 [P.O.:560; I.V.:300] Out: 705 [Urine:700; Blood:5] Intake/Output this shift: Total I/O In: 240 [P.O.:240] Out: 600 [Urine:600]  General appearance: alert, cooperative and no distress Extremities: right leg with reduced erythema, 8X5X0.5cm wound with no necrotic tissue, healthy, deeper portion with some sloughing. drainage     Lab Results:   Recent Labs  06/29/17 0429 06/30/17 0611  WBC 9.0 7.3  HGB 9.7* 10.7*  HCT 31.3* 33.6*  PLT 231 262   BMET  Recent Labs  06/27/17 1611 06/28/17 0510  NA 137 137  K 4.3 4.1  CL 99* 99*  CO2 32 32  GLUCOSE 98 88  BUN 19 22*  CREATININE 0.91 0.72  CALCIUM 9.1 8.5*   PT/INR  Recent Labs  06/28/17 1427  LABPROT 14.5  INR 1.14     Anti-infectives: Anti-infectives    Start     Dose/Rate Route Frequency Ordered Stop   06/28/17 1100  clindamycin (CLEOCIN) IVPB 600 mg     600 mg 100 mL/hr over 30 Minutes Intravenous Every 8 hours 06/28/17 0926     06/28/17 0115  cefTRIAXone (ROCEPHIN) 1 g in dextrose 5 % 50 mL IVPB  Status:  Discontinued     1 g 100 mL/hr over 30 Minutes Intravenous Every 24 hours 06/28/17 0111 06/29/17 1805   06/27/17 2215  clindamycin (CLEOCIN) IVPB 600 mg     600 mg 100 mL/hr over 30 Minutes Intravenous  Once 06/27/17 2214 06/27/17 2318      Assessment/Plan: Ms. Bales is an 83 yo with right infected leg wound  s/p RIGHT LOWER LEG DEBRIDEMENT. She is doing well. She tolerated her dressing change.   -Can switch to oral antibiotics  -Twice daily dressing changes:   LEAVE THE XEROFORM IN PLACE AND DO NOT REMOVE OR GET WET.   Treating this portion of wound like skin graft site, very superficial, so will allow to dry and trim.   Place damp to dry gauze into the deeper medial portion of wound and dry gauze over and loosely wrap. -Home health requested by patient and family. For now will need twice daily packing of the deeper portion of wound.  -Once home health and family able to do this, then I can follow up as outpatient for wound checks.    LOS: 3 days    [name] 06/30/2017

## 2017-06-30 NOTE — Discharge Summary (Signed)
Physician Discharge Summary  Andrea [name] SJG:[id number] DOB: 11/13/1928 DOA: 06/27/2017  PCP: Unk Pinto, MD  Admit date: 06/27/2017 Discharge date: 06/30/2017  Time spent: 45 minutes  Recommendations for Outpatient Follow-up:  -To be discharged home today. -Home health RN will be arranged for wound care. -To continue clindamycin for 10 days. -Follow-up with Dr. Constance Haw as scheduled by her office.   Discharge Diagnoses:  Principal Problem:   Cellulitis of right lower extremity Active Problems:   Essential hypertension   COPD with emphysema (HCC)   Hyperlipidemia   Hypothyroidism   Pulmonary hypertension due to COPD (HCC)   Chronic respiratory failure with hypoxia (HCC)   AF (paroxysmal atrial fibrillation) (HCC)   Protein-calorie malnutrition, severe (HCC)   Traumatic open wound of right lower leg with infection   Discharge Condition: Stable and improved  Filed Weights   06/28/17 0100 06/29/17 0539 06/30/17 0500  Weight: 46.5 kg (102 lb 8.2 oz) 46.5 kg (102 lb 8.6 oz) 47.2 kg (104 lb)    History of present illness:  As per Dr. Olevia Bowens on 10/1: Andrea [name] is a 83 y.o. female with medical history significant of aortic insufficiency, mild mitral and tricuspid regurgitation, COPD, grade 2 diastolic dysfunction by 6546 echocardiogram, benign HDL hyperlipidemia, hypertension, chronic atrial fibrillation with a shot by a score of at least 5, hypothyroidism was coming to the emergency department after being referred from [place] surgery by Dr. Dema Severin for inpatient treatment of RLE wound that she sustained last week that has not responded to oral antibiotic therapy. She initially went to [facility] ED, but due to the long wait and since she lives locally came to [facility] ED. She denies fever, chills, sore throat, worsening dyspnea, chest pain, palpitations, dizziness, diaphoresis, lower extremity edema, PND or orthopnea. No abdominal pain nausea, emesis, diarrhea,  constipation, melena or hematochezia. No polyuria, polydipsia or blurred vision. Denies dysuria, frequency or hematuria.  ED Course: Initial vital signs at [facility] ED were temperature 97.48F, pulse 76, respirations 19, blood pressure 175/64 mmHg and O2 sat of 94% on room air. She received clindamycin 600 mg IVPB.  Lab work done earlier today shows WBC of 10.7 with 71% neutrophils, hemoglobin 10.6 g/dL and platelets 267. Her chemistry shows a chloride of 99, carbon dioxide of 32 and lactic acid of 0.55 mmol/L. Hepatic function showed a mildly decreased albumin at 3.3 g/dL. All other values are unremarkable. An x-ray of her right lower extremity did not show fractures or any osseous pathology.   Hospital Course:   Right leg wound with necrosis and cellulitis -Status post I and D by Dr. Constance Haw on 10/3 with debridement of necrotic tissue. -Plan to continue clindamycin as she has shown improvement. Plan for 10 days of treatment.  Atrial fibrillation -Rate controlled, currently in sinus rhythm, continue Eliquis for anticoagulation and diltiazem and metoprolol for rate control. -She has had multiple recent falls, would recommend further discussions with cardiologist in regards to risk benefit of anticoagulation. Will continue Eliquis for now.  Chronic diastolic CHF -Compensated at present. Continue metoprolol.  Hypothyroidism -Continue Synthroid  Hypertension -Well-controlled, continue metoprolol and diltiazem  Procedures:  Surgical debridement of right lower extremity wound on 10/3   Consultations:  Surgery, Dr. Constance Haw  Discharge Instructions  Discharge Instructions    Diet - low sodium heart healthy    Complete by:  As directed    Increase activity slowly    Complete by:  As directed  Allergies as of 06/30/2017      Reactions   Vancomycin    Worsening kidney function   Sulfa Antibiotics Palpitations   Unknown      Medication List    STOP taking these  medications   cephALEXin 500 MG capsule Commonly known as:  KEFLEX     TAKE these medications   ALPRAZolam 0.25 MG tablet Commonly known as:  XANAX Take 0.25 mg by mouth at bedtime.   apixaban 2.5 MG Tabs tablet Commonly known as:  ELIQUIS Take 1 tablet (2.5 mg total) by mouth 2 (two) times daily.   cholecalciferol 1000 units tablet Commonly known as:  VITAMIN D Take 1,000 Units by mouth daily.   clindamycin 300 MG capsule Commonly known as:  CLEOCIN Take 1 capsule (300 mg total) by mouth 3 (three) times daily.   diltiazem 90 MG tablet Commonly known as:  CARDIZEM Take 90 mg by mouth 4 (four) times daily. What changed:  Another medication with the same name was removed. Continue taking this medication, and follow the directions you see here.   feeding supplement (ENSURE ENLIVE) Liqd Take 237 mLs by mouth 2 (two) times daily between meals.   furosemide 20 MG tablet Commonly known as:  LASIX Take 20 mg by mouth daily as needed for fluid.   ipratropium-albuterol 0.5-2.5 (3) MG/3ML Soln Commonly known as:  DUONEB Take 3 mLs by nebulization every 6 (six) hours as needed.   levothyroxine 50 MCG tablet Commonly known as:  SYNTHROID, LEVOTHROID TAKE 1 TABLET BY MOUTH DAILY OR AS DIRECTED What changed:  See the new instructions.   Metoprolol Tartrate 37.5 MG Tabs Take 37.5 mg by mouth 2 (two) times daily.   vitamin C 500 MG tablet Commonly known as:  ASCORBIC ACID Take 500 mg by mouth daily.      Allergies  Allergen Reactions  . Vancomycin     Worsening kidney function  . Sulfa Antibiotics Palpitations    Unknown   Follow-up Information    [facility] Follow up.   Why:  home health nurse Contact information: [address] [place]       Unk Pinto, MD. Schedule an appointment as soon as possible for a visit in 2 week(s).   Specialty:  Internal Medicine Contact information: [address] [place] [address] [phone]            The results of significant diagnostics from this hospitalization (including imaging, microbiology, ancillary and laboratory) are listed below for reference.    Significant Diagnostic Studies: Chest 2 View  Result Date: 06/28/2017 CLINICAL DATA:  Preoperative x-ray. EXAM: CHEST  2 VIEW COMPARISON:  Chest x-ray dated April 07, 2017. FINDINGS: The cardiomediastinal silhouette is normal in size. Atherosclerotic calcification of the aortic arch. Mild pulmonary vascular congestion. Hyperinflated lungs with scattered pleuroparenchymal scarring and prominent emphysematous changes. Unchanged small left-greater-than-right pleural effusions. Left basilar airspace opacity appears slightly improved. No pneumothorax. No acute osseous abnormality. IMPRESSION: 1. Severe COPD. Improving airspace disease in the left lower lobe, which may reflect resolving pneumonia or atelectasis. 2. Unchanged mild pulmonary vascular congestion and small left greater than right pleural effusions. No overt edema. Electronically Signed   By: Titus Dubin M.D.   On: 06/28/2017 15:37   Dg Tibia/fibula Right  Result Date: 06/27/2017 CLINICAL DATA:  Patient with right lower extremity wound for 1 week. Lower tibia and fibula swelling. Initial encounter. EXAM: RIGHT TIBIA AND FIBULA -  2 VIEW COMPARISON:  None. FINDINGS: Normal anatomic alignment. No evidence for acute fracture or dislocation. Bandaging material overlying the anterior distal lower extremity. IMPRESSION: No acute osseous abnormality. Electronically Signed   By: Lovey Newcomer M.D.   On: 06/27/2017 18:26    Microbiology: No results found for this or any previous visit (from the past 240 hour(s)).   Labs: Basic Metabolic Panel:  Recent Labs Lab 06/27/17 1611 06/28/17 0510  NA 137 137  K 4.3 4.1  CL 99* 99*  CO2 32 32  GLUCOSE 98 88  BUN 19 22*  CREATININE 0.91 0.72  CALCIUM 9.1 8.5*   Liver  Function Tests:  Recent Labs Lab 06/27/17 1611  AST 19  ALT 13*  ALKPHOS 71  BILITOT 0.6  PROT 7.3  ALBUMIN 3.3*   No results for input(s): LIPASE, AMYLASE in the last 168 hours. No results for input(s): AMMONIA in the last 168 hours. CBC:  Recent Labs Lab 06/27/17 1611 06/28/17 0510 06/29/17 0429 06/30/17 0611  WBC 10.7* 7.3 9.0 7.3  NEUTROABS 7.6 4.8 6.3 4.9  HGB 10.6* 9.6* 9.7* 10.7*  HCT 34.5* 31.3* 31.3* 33.6*  MCV 96.9 98.7 96.9 96.0  PLT 267 217 231 262   Cardiac Enzymes: No results for input(s): CKTOTAL, CKMB, CKMBINDEX, TROPONINI in the last 168 hours. BNP: BNP (last 3 results)  Recent Labs  04/02/17 1207 04/05/17 0359 04/25/17 0430  BNP 1,075.0* 433.0* 329.0*    ProBNP (last 3 results) No results for input(s): PROBNP in the last 8760 hours.  CBG: No results for input(s): GLUCAP in the last 168 hours.     SignedLelon Frohlich  [facility] Pager: [phone] 06/30/2017, 11:34 AM

## 2017-06-30 NOTE — Care Management Important Message (Signed)
Important Message  Patient Details  Name: Andrea [name] MRN: [id number] Date of Birth: November 28, 1928   Medicare Important Message Given:  Yes    Keltie Labell, Chauncey [place], RN 06/30/2017, 1:57 PM

## 2017-07-01 DIAGNOSIS — I351 Nonrheumatic aortic (valve) insufficiency: Secondary | ICD-10-CM | POA: Diagnosis not present

## 2017-07-01 DIAGNOSIS — J449 Chronic obstructive pulmonary disease, unspecified: Secondary | ICD-10-CM | POA: Diagnosis not present

## 2017-07-01 DIAGNOSIS — I2723 Pulmonary hypertension due to lung diseases and hypoxia: Secondary | ICD-10-CM | POA: Diagnosis not present

## 2017-07-01 DIAGNOSIS — I340 Nonrheumatic mitral (valve) insufficiency: Secondary | ICD-10-CM | POA: Diagnosis not present

## 2017-07-01 DIAGNOSIS — Z7901 Long term (current) use of anticoagulants: Secondary | ICD-10-CM | POA: Diagnosis not present

## 2017-07-01 DIAGNOSIS — I480 Paroxysmal atrial fibrillation: Secondary | ICD-10-CM | POA: Diagnosis not present

## 2017-07-01 DIAGNOSIS — Z9981 Dependence on supplemental oxygen: Secondary | ICD-10-CM | POA: Diagnosis not present

## 2017-07-01 DIAGNOSIS — S81811D Laceration without foreign body, right lower leg, subsequent encounter: Secondary | ICD-10-CM | POA: Diagnosis not present

## 2017-07-01 DIAGNOSIS — E43 Unspecified severe protein-calorie malnutrition: Secondary | ICD-10-CM | POA: Diagnosis not present

## 2017-07-01 DIAGNOSIS — J9611 Chronic respiratory failure with hypoxia: Secondary | ICD-10-CM | POA: Diagnosis not present

## 2017-07-01 DIAGNOSIS — L03115 Cellulitis of right lower limb: Secondary | ICD-10-CM | POA: Diagnosis not present

## 2017-07-01 DIAGNOSIS — I361 Nonrheumatic tricuspid (valve) insufficiency: Secondary | ICD-10-CM | POA: Diagnosis not present

## 2017-07-01 DIAGNOSIS — I10 Essential (primary) hypertension: Secondary | ICD-10-CM | POA: Diagnosis not present

## 2017-07-01 DIAGNOSIS — Z87891 Personal history of nicotine dependence: Secondary | ICD-10-CM | POA: Diagnosis not present

## 2017-07-04 ENCOUNTER — Telehealth: Payer: Self-pay | Admitting: *Deleted

## 2017-07-04 DIAGNOSIS — Z9981 Dependence on supplemental oxygen: Secondary | ICD-10-CM | POA: Diagnosis not present

## 2017-07-04 DIAGNOSIS — S81811D Laceration without foreign body, right lower leg, subsequent encounter: Secondary | ICD-10-CM | POA: Diagnosis not present

## 2017-07-04 NOTE — Telephone Encounter (Signed)
CALLED SPOKE TO PATIENT SHE ASKED TO SCHEDULE AFTER HER SURGEON APPT THIS Thursday. SCHEDULED PT FOR Monday 07/11/17 AT 10:30

## 2017-07-05 DIAGNOSIS — I10 Essential (primary) hypertension: Secondary | ICD-10-CM | POA: Diagnosis not present

## 2017-07-05 DIAGNOSIS — J449 Chronic obstructive pulmonary disease, unspecified: Secondary | ICD-10-CM | POA: Diagnosis not present

## 2017-07-05 DIAGNOSIS — S81811D Laceration without foreign body, right lower leg, subsequent encounter: Secondary | ICD-10-CM | POA: Diagnosis not present

## 2017-07-05 DIAGNOSIS — E43 Unspecified severe protein-calorie malnutrition: Secondary | ICD-10-CM | POA: Diagnosis not present

## 2017-07-05 DIAGNOSIS — I5032 Chronic diastolic (congestive) heart failure: Secondary | ICD-10-CM | POA: Diagnosis not present

## 2017-07-05 DIAGNOSIS — I351 Nonrheumatic aortic (valve) insufficiency: Secondary | ICD-10-CM | POA: Diagnosis not present

## 2017-07-05 DIAGNOSIS — L03115 Cellulitis of right lower limb: Secondary | ICD-10-CM | POA: Diagnosis not present

## 2017-07-05 DIAGNOSIS — J9611 Chronic respiratory failure with hypoxia: Secondary | ICD-10-CM | POA: Diagnosis not present

## 2017-07-05 DIAGNOSIS — Z87891 Personal history of nicotine dependence: Secondary | ICD-10-CM | POA: Diagnosis not present

## 2017-07-05 DIAGNOSIS — I2723 Pulmonary hypertension due to lung diseases and hypoxia: Secondary | ICD-10-CM | POA: Diagnosis not present

## 2017-07-05 DIAGNOSIS — I480 Paroxysmal atrial fibrillation: Secondary | ICD-10-CM | POA: Diagnosis not present

## 2017-07-05 DIAGNOSIS — I361 Nonrheumatic tricuspid (valve) insufficiency: Secondary | ICD-10-CM | POA: Diagnosis not present

## 2017-07-05 DIAGNOSIS — I340 Nonrheumatic mitral (valve) insufficiency: Secondary | ICD-10-CM | POA: Diagnosis not present

## 2017-07-05 DIAGNOSIS — Z7901 Long term (current) use of anticoagulants: Secondary | ICD-10-CM | POA: Diagnosis not present

## 2017-07-05 DIAGNOSIS — Z9981 Dependence on supplemental oxygen: Secondary | ICD-10-CM | POA: Diagnosis not present

## 2017-07-06 ENCOUNTER — Ambulatory Visit: Payer: Medicare Other | Admitting: Internal Medicine

## 2017-07-06 DIAGNOSIS — J9621 Acute and chronic respiratory failure with hypoxia: Secondary | ICD-10-CM | POA: Diagnosis not present

## 2017-07-06 DIAGNOSIS — J439 Emphysema, unspecified: Secondary | ICD-10-CM | POA: Diagnosis not present

## 2017-07-07 ENCOUNTER — Ambulatory Visit (INDEPENDENT_AMBULATORY_CARE_PROVIDER_SITE_OTHER): Payer: Self-pay | Admitting: General Surgery

## 2017-07-07 ENCOUNTER — Encounter: Payer: Self-pay | Admitting: General Surgery

## 2017-07-07 VITALS — BP 140/57 | HR 66 | Temp 97.5°F | Resp 18 | Ht 63.0 in | Wt 103.0 lb

## 2017-07-07 DIAGNOSIS — Z5189 Encounter for other specified aftercare: Secondary | ICD-10-CM

## 2017-07-07 MED ORDER — TRAMADOL HCL 50 MG PO TABS: 50.0000 mg | ORAL_TABLET | Freq: Four times a day (QID) | ORAL | 0 refills | Status: DC | PRN

## 2017-07-07 MED ORDER — AMOXICILLIN-POT CLAVULANATE 875-125 MG PO TABS: 1.0000 | ORAL_TABLET | Freq: Two times a day (BID) | ORAL | 0 refills | Status: DC

## 2017-07-07 NOTE — Patient Instructions (Signed)
Place xeroform on the area previously being packed. Elevated legs. Place dry dressing over xeroform, and do not remove the xeroform. Take your lasix every day for next week.

## 2017-07-08 ENCOUNTER — Telehealth: Payer: Self-pay | Admitting: Internal Medicine

## 2017-07-08 NOTE — Progress Notes (Signed)
Encounter 07/07/2017     [facility]   HPI:  83 y.o. Female presents to clinic for follow up evaluation of her right leg wound that she suffered a few weeks ago and required IV antibiotics and operative debridement for last week.  The patient has been doing fair. She has been taking the clindamycin. The wound with the xeroform has stayed dry and the damp to dry medial portion of the wound is healing nicely. Her daughters have been helping her care for the wound.   Review of Systems:  No fevers or chills Tolerating dressing changes  All other review of systems: otherwise negative   Vital Signs:  BP (!) 140/57   Pulse 66   Temp (!) 97.5 F (36.4 C)   Resp 18   Ht _0  (1.6 m)   Wt 103 lb (46.7 kg)   BMI 18.25 kg/m    Physical Exam:  Physical Exam  Constitutional: She is oriented to person, place, and time and well-developed, well-nourished, and in no distress.  Cardiovascular: Normal rate.   Pulmonary/Chest: Effort normal.  Musculoskeletal: Normal range of motion.  Neurological: She is alert and oriented to person, place, and time.  Skin:  Right leg wound dry without drainage, xeroform dry and adherent, trimmed. Medial portion more shallow. No drainage. Some erythema on skin surrounding leg and edema.         Laboratory studies: None   Imaging:  None   Assessment:  83 y.o. yo Female with a traumatic infected right leg wound s/p IV antibiotics and debridement in the OR. She is doing fair and tolerating her dressing changes. Since the medial portion is getting more shallow, will plan to place xeroform on this area and allow the whole wound to dry out.  This will act as a barrier (similar to barrier used for skin harvest site for a graft).  Will slowly trim the xeroform as the wound heels in below and scabs over.  Plan:  - Changed to augmentin for the cellulitis that remains   - Take lasix daily for next week to see if helps with edema, usually takes  PRN - Xeroform to the medial portion, daughter agrees she understands how and where to place this  - Following the xeroform, leave everything in place, leave dry, and place loose kerlix around leg daily.   Future Appointments Date Time Provider [place]  07/11/2017 10:30 AM Liane Comber, NP [facility] None  07/14/2017 1:15 PM [name], MD [facility] None  08/16/2017 2:30 PM Unk Pinto, MD [facility] None  09/22/2017 3:40 PM Lelon Perla, MD [facility]  10/05/2017 3:00 PM Vicie Mutters, PA-C [facility] None    All of the above recommendations were discussed with the patient and patient's family, and all of patient's and family's questions were answered to their expressed satisfaction.  Curlene Labrum, MD [facility] [address] [place], [place] [address] [phone] (office)

## 2017-07-08 NOTE — Telephone Encounter (Signed)
[facility] called to advise Called to advise Andrea [name] has no power and declined Nursing for today,

## 2017-07-10 NOTE — Progress Notes (Deleted)
Hospital follow up  Assessment and Plan: Hospital visit follow up for:  Diagnoses and all orders for this visit:  Traumatic open wound of right lower leg with infection, sequela  Cellulitis of right lower extremity    There are no discontinued medications.  Over 40 minutes of exam, counseling, chart review, and complex, high/moderate level critical decision making was performed this visit.   Future Appointments Date Time Provider [place]  07/11/2017 10:30 AM Liane Comber, NP [facility] None  07/14/2017 1:15 PM [name], MD [facility] None  08/16/2017 2:30 PM Unk Pinto, MD [facility] None  09/22/2017 3:40 PM Lelon Perla, MD [facility]  10/05/2017 3:00 PM Vicie Mutters, PA-C [facility] None      HPI 83 y.o.female presents for follow up for transition from recent hospitalization or SNIF stay. Admit date to the hospital was 06/27/17, patient was discharged from the hospital on 06/30/17 and our clinical staff contacted the office the day after discharge to set up a follow up appointment. The discharge summary, medications, and diagnostic test results were reviewed before meeting with the patient. The patient was admitted for   Recommendations for Outpatient Follow-up:  -To be discharged home today. -Home health RN will be arranged for wound care. -To continue clindamycin for 10 days. -Follow-up with Dr. Constance Haw as scheduled by her office.  Dr. Constance Haw 07/07/2017: for follow up evaluation of her right leg wound that she suffered a few weeks ago and required IV antibiotics and operative debridement for last week.  The patient has been doing fair. She has been taking the clindamycin. The wound with the xeroform has stayed dry and the damp to dry medial portion of the wound is healing nicely. Her daughters have been helping her care for the wound.   Home health is involved.   Images while in the hospital: Chest 2 View  Result Date:  06/28/2017 CLINICAL DATA:  Preoperative x-ray. EXAM: CHEST  2 VIEW COMPARISON:  Chest x-ray dated April 07, 2017. FINDINGS: The cardiomediastinal silhouette is normal in size. Atherosclerotic calcification of the aortic arch. Mild pulmonary vascular congestion. Hyperinflated lungs with scattered pleuroparenchymal scarring and prominent emphysematous changes. Unchanged small left-greater-than-right pleural effusions. Left basilar airspace opacity appears slightly improved. No pneumothorax. No acute osseous abnormality. IMPRESSION: 1. Severe COPD. Improving airspace disease in the left lower lobe, which may reflect resolving pneumonia or atelectasis. 2. Unchanged mild pulmonary vascular congestion and small left greater than right pleural effusions. No overt edema. Electronically Signed   By: Titus Dubin M.D.   On: 06/28/2017 15:37   Dg Tibia/fibula Right  Result Date: 06/27/2017 CLINICAL DATA:  Patient with right lower extremity wound for 1 week. Lower tibia and fibula swelling. Initial encounter. EXAM: RIGHT TIBIA AND FIBULA - 2 VIEW COMPARISON:  None. FINDINGS: Normal anatomic alignment. No evidence for acute fracture or dislocation. Bandaging material overlying the anterior distal lower extremity. IMPRESSION: No acute osseous abnormality. Electronically Signed   By: Lovey Newcomer M.D.   On: 06/27/2017 18:26    Past Medical History:  Diagnosis Date  . Aortic insufficiency 09/04/2015  . Aortic regurgitation   . Bronchiectasis ([place])   . COPD (chronic obstructive pulmonary disease) ([place])   . DJD (degenerative joint disease)   . HLD (hyperlipidemia)   . HTN (hypertension)   . Mild mitral regurgitation by prior echocardiogram   . Mild mitral regurgitation by prior echocardiogram   . Mild tricuspid regurgitation   . Palpitations    a. has known history  of PAC's and PVC's. b. 09/2015: monitor showed sinus rhythm with occasional PAC's and a brief episode of PAT.  [name] Thyroid disease      Allergies   Allergen Reactions  . Vancomycin     Worsening kidney function  . Sulfa Antibiotics Palpitations    Unknown      Current Outpatient Prescriptions on File Prior to Visit  Medication Sig Dispense Refill  . ALPRAZolam (XANAX) 0.25 MG tablet Take 0.25 mg by mouth at bedtime.     [name] amoxicillin-clavulanate (AUGMENTIN) 875-125 MG tablet Take 1 tablet by mouth 2 (two) times daily. 10 tablet 0  . apixaban (ELIQUIS) 2.5 MG TABS tablet Take 1 tablet (2.5 mg total) by mouth 2 (two) times daily. 180 tablet 3  . Ascorbic Acid (VITAMIN C) 500 MG tablet Take 500 mg by mouth daily.      . cholecalciferol (VITAMIN D) 1000 UNITS tablet Take 1,000 Units by mouth daily.     . clindamycin (CLEOCIN) 300 MG capsule Take 1 capsule (300 mg total) by mouth 3 (three) times daily. 30 capsule 0  . diltiazem (CARDIZEM) 90 MG tablet Take 90 mg by mouth 4 (four) times daily.    . feeding supplement, ENSURE ENLIVE, (ENSURE ENLIVE) LIQD Take 237 mLs by mouth 2 (two) times daily between meals. 237 mL 12  . furosemide (LASIX) 20 MG tablet Take 20 mg by mouth daily as needed for fluid.     [name] ipratropium-albuterol (DUONEB) 0.5-2.5 (3) MG/3ML SOLN Take 3 mLs by nebulization every 6 (six) hours as needed. 360 mL 1  . levothyroxine (SYNTHROID, LEVOTHROID) 50 MCG tablet TAKE 1 TABLET BY MOUTH DAILY OR AS DIRECTED (Patient taking differently: TAKE 1 TABLET BY MOUTH DAILY) 90 tablet 1  . Metoprolol Tartrate 37.5 MG TABS Take 37.5 mg by mouth 2 (two) times daily. 180 tablet 3  . traMADol (ULTRAM) 50 MG tablet Take 1 tablet (50 mg total) by mouth every 6 (six) hours as needed. 20 tablet 0   No current facility-administered medications on file prior to visit.     ROS: all negative except above.   Physical Exam: There were no vitals filed for this visit. There were no vitals taken for this visit. General Appearance: Well nourished, in no apparent distress. Eyes: PERRLA, EOMs, conjunctiva no swelling or erythema Sinuses: No  Frontal/maxillary tenderness ENT/Mouth: Ext aud canals clear, TMs without erythema, bulging. No erythema, swelling, or exudate on post pharynx.  Tonsils not swollen or erythematous. Hearing normal.  Neck: Supple, thyroid normal.  Respiratory: Respiratory effort normal, BS equal bilaterally without rales, rhonchi, wheezing or stridor.  Cardio: RRR with no MRGs. Brisk peripheral pulses without edema.  Abdomen: Soft, + BS.  Non tender, no guarding, rebound, hernias, masses. Lymphatics: Non tender without lymphadenopathy.  Musculoskeletal: Full ROM, 5/5 strength, normal gait.  Skin: Warm, dry without rashes, lesions, ecchymosis.  Neuro: Cranial nerves intact. Normal muscle tone, no cerebellar symptoms. Sensation intact.  Psych: Awake and oriented X 3, normal affect, Insight and Judgment appropriate.     Izora Ribas, NP 3:36 PM [facility]

## 2017-07-11 ENCOUNTER — Ambulatory Visit: Payer: Self-pay | Admitting: Adult Health

## 2017-07-12 DIAGNOSIS — Z9981 Dependence on supplemental oxygen: Secondary | ICD-10-CM | POA: Diagnosis not present

## 2017-07-12 DIAGNOSIS — Z7901 Long term (current) use of anticoagulants: Secondary | ICD-10-CM | POA: Diagnosis not present

## 2017-07-12 DIAGNOSIS — S81811D Laceration without foreign body, right lower leg, subsequent encounter: Secondary | ICD-10-CM | POA: Diagnosis not present

## 2017-07-12 DIAGNOSIS — J9611 Chronic respiratory failure with hypoxia: Secondary | ICD-10-CM | POA: Diagnosis not present

## 2017-07-12 DIAGNOSIS — I340 Nonrheumatic mitral (valve) insufficiency: Secondary | ICD-10-CM | POA: Diagnosis not present

## 2017-07-12 DIAGNOSIS — I361 Nonrheumatic tricuspid (valve) insufficiency: Secondary | ICD-10-CM | POA: Diagnosis not present

## 2017-07-12 DIAGNOSIS — I351 Nonrheumatic aortic (valve) insufficiency: Secondary | ICD-10-CM | POA: Diagnosis not present

## 2017-07-12 DIAGNOSIS — L03115 Cellulitis of right lower limb: Secondary | ICD-10-CM | POA: Diagnosis not present

## 2017-07-12 DIAGNOSIS — I10 Essential (primary) hypertension: Secondary | ICD-10-CM | POA: Diagnosis not present

## 2017-07-12 DIAGNOSIS — I2723 Pulmonary hypertension due to lung diseases and hypoxia: Secondary | ICD-10-CM | POA: Diagnosis not present

## 2017-07-12 DIAGNOSIS — J449 Chronic obstructive pulmonary disease, unspecified: Secondary | ICD-10-CM | POA: Diagnosis not present

## 2017-07-12 DIAGNOSIS — Z87891 Personal history of nicotine dependence: Secondary | ICD-10-CM | POA: Diagnosis not present

## 2017-07-12 DIAGNOSIS — E43 Unspecified severe protein-calorie malnutrition: Secondary | ICD-10-CM | POA: Diagnosis not present

## 2017-07-12 DIAGNOSIS — I480 Paroxysmal atrial fibrillation: Secondary | ICD-10-CM | POA: Diagnosis not present

## 2017-07-14 ENCOUNTER — Other Ambulatory Visit (HOSPITAL_COMMUNITY)
Admission: RE | Admit: 2017-07-14 | Discharge: 2017-07-14 | Disposition: A | Discharge status: Home / Self Care | Payer: Medicare Other | Source: Ambulatory Visit | Attending: General Surgery | Admitting: General Surgery

## 2017-07-14 ENCOUNTER — Encounter: Payer: Self-pay | Admitting: General Surgery

## 2017-07-14 ENCOUNTER — Ambulatory Visit (INDEPENDENT_AMBULATORY_CARE_PROVIDER_SITE_OTHER): Payer: Self-pay | Admitting: General Surgery

## 2017-07-14 ENCOUNTER — Other Ambulatory Visit: Payer: Self-pay | Admitting: General Surgery

## 2017-07-14 ENCOUNTER — Telehealth: Payer: Self-pay | Admitting: General Surgery

## 2017-07-14 ENCOUNTER — Ambulatory Visit (HOSPITAL_COMMUNITY)
Admission: RE | Admit: 2017-07-14 | Discharge: 2017-07-14 | Disposition: A | Discharge status: Home / Self Care | Payer: Medicare Other | Source: Ambulatory Visit | Attending: General Surgery | Admitting: General Surgery

## 2017-07-14 VITALS — BP 134/53 | HR 68 | Temp 99.6°F | Resp 18 | Ht 63.0 in | Wt 105.0 lb

## 2017-07-14 DIAGNOSIS — M79661 Pain in right lower leg: Secondary | ICD-10-CM | POA: Insufficient documentation

## 2017-07-14 DIAGNOSIS — M7989 Other specified soft tissue disorders: Secondary | ICD-10-CM

## 2017-07-14 DIAGNOSIS — Z09 Encounter for follow-up examination after completed treatment for conditions other than malignant neoplasm: Secondary | ICD-10-CM | POA: Insufficient documentation

## 2017-07-14 DIAGNOSIS — R600 Localized edema: Secondary | ICD-10-CM | POA: Diagnosis not present

## 2017-07-14 LAB — BASIC METABOLIC PANEL
Anion gap: 9 (ref 5–15)
BUN: 23 mg/dL — ABNORMAL HIGH (ref 6–20)
CALCIUM: 9.3 mg/dL (ref 8.9–10.3)
CHLORIDE: 97 mmol/L — AB (ref 101–111)
CO2: 33 mmol/L — ABNORMAL HIGH (ref 22–32)
CREATININE: 0.91 mg/dL (ref 0.44–1.00)
GFR calc non Af Amer: 56 mL/min — ABNORMAL LOW (ref >60)
Glucose, Bld: 90 mg/dL (ref 65–99)
Potassium: 4.2 mmol/L (ref 3.5–5.1)
SODIUM: 139 mmol/L (ref 135–145)

## 2017-07-14 MED ORDER — SILVER SULFADIAZINE 1 % EX CREA: TOPICAL_CREAM | CUTANEOUS | 1 refills | Status: DC

## 2017-07-14 NOTE — Patient Instructions (Addendum)
Place silvadene cream and dry gauze to open part of wound daily. Prior to reapplication, clean area with saline and gauze. Silvadene does have sulfa in the medication. Your allergy is palpations.  It is topical and should not cause the same issues, but if you have any issues please notify us or in emergency go to the ED.  Keep foot elevated as much as possible. [place] ordered to rule out blood clot. Will let you know the time/date. Labs ordered to assess kidney function given lasix use in last week.

## 2017-07-14 NOTE — Telephone Encounter (Signed)
Lasix BID for three days. Silvadene to medial portion of wound. No DVT on [place].  Curlene Labrum, MD [facility] [address] [place], [place] [address] [phone] (office)

## 2017-07-14 NOTE — Progress Notes (Signed)
[facility] Note   HPI:  83 y.o. Female presents to clinic for post-op follow-up evaluation of her right traumatic leg wound s/p debridement.  The patient has been having more swelling in the last few days and reports being on her feet more because her husband is in the hospital.  The xeroform was applied to the medial aspect of the wound but this has been draining somewhat and not dried up.  She has no fevers or chills. She completed her antibiotics. She has been taking lasix 43m daily.    Review of Systems:  Some increased right lower extremity swelling  All other review of systems: otherwise negative   Vital Signs:  BP (!) 134/53   Pulse 68   Temp 99.6 F (37.6 C)   Resp 18   Ht _0  (1.6 m)   Wt 105 lb (47.6 kg)   BMI 18.60 kg/m    Physical Exam:  Physical Exam  Constitutional: She is oriented to person, place, and time and well-developed, well-nourished, and in no distress.  Pulmonary/Chest: Effort normal.  Musculoskeletal:  Right leg wound with lateral portion dry and xeroform dry and trimmed, edges with scabbing, minimal erythema, medial portion with some fibrinous exudate. Xeroform not adherent medially. Removed xeroform on this portion  Neurological: She is alert and oriented to person, place, and time.  Skin: Skin is warm and dry.    Laboratory studies:   Recent Labs  07/14/17 1458  NA 139  K 4.2  CL 97*  CO2 33*  GLUCOSE 90  BUN 23*  CREATININE 0.91  CALCIUM 9.3    Studies/Results: [place]Venous Img Lower Unilateral Right  Result Date: 07/14/2017 CLINICAL DATA:  Pain and edema right lower extremity. Prior right lower extremity wound debridement. EXAM: RIGHT LOWER EXTREMITY VENOUS DOPPLER ULTRASOUND TECHNIQUE: Gray-scale sonography with graded compression, as well as color Doppler and duplex ultrasound were performed to evaluate the lower extremity deep venous systems from the level of the common femoral vein and including the common  femoral, femoral, profunda femoral, popliteal and calf veins including the posterior tibial, peroneal and gastrocnemius veins when visible. The superficial great saphenous vein was also interrogated. Spectral Doppler was utilized to evaluate flow at rest and with distal augmentation maneuvers in the common femoral, femoral and popliteal veins. COMPARISON:  None. FINDINGS: Contralateral Common Femoral Vein: Respiratory phasicity is normal and symmetric with the symptomatic side. No evidence of thrombus. Normal compressibility. Common Femoral Vein: No evidence of thrombus. Normal compressibility, respiratory phasicity and response to augmentation. Saphenofemoral Junction: No evidence of thrombus. Normal compressibility and flow on color Doppler imaging. Profunda Femoral Vein: No evidence of thrombus. Normal compressibility and flow on color Doppler imaging. Femoral Vein: No evidence of thrombus. Normal compressibility, respiratory phasicity and response to augmentation. Popliteal Vein: No evidence of thrombus. Normal compressibility, respiratory phasicity and response to augmentation. Calf Veins: Limited visualization . No evidence of thrombus. Normal compressibility and flow on color Doppler imaging. Superficial Great Saphenous Vein: No evidence of thrombus. Normal compressibility. Other Findings:  None. IMPRESSION: No evidence of deep venous thrombosis. Electronically Signed   By: TMarcello Moores Register   On: 07/14/2017 15:00    Assessment:  83y.o. yo Female with right leg wound from trauma s/p debridement with slow healing. Patient has been changing the dry dressing daily. And xeroform on medial portion not adherent. This was removed and area under with some granulation and some fibrinous material.    Plan:  - Silvadene to the  medial portion of wound that is exposed, keep dry otherwise and clean off silvadene between dressing changes   - [place] ordered given the swelling- negative for DVT on the right - Cr 0.91,  will get patient to take lasix 52m twice daily for next 3 days and see if improves swelling and elevate leg as much as possible  - Will call patient and let her know the results and plan   - Follow up in 1 week   LCurlene Labrum MD [facility] [phone](office)

## 2017-07-18 DIAGNOSIS — Z9981 Dependence on supplemental oxygen: Secondary | ICD-10-CM | POA: Diagnosis not present

## 2017-07-18 DIAGNOSIS — S81811D Laceration without foreign body, right lower leg, subsequent encounter: Secondary | ICD-10-CM | POA: Diagnosis not present

## 2017-07-19 ENCOUNTER — Telehealth: Payer: Self-pay | Admitting: General Surgery

## 2017-07-19 DIAGNOSIS — I340 Nonrheumatic mitral (valve) insufficiency: Secondary | ICD-10-CM | POA: Diagnosis not present

## 2017-07-19 DIAGNOSIS — E43 Unspecified severe protein-calorie malnutrition: Secondary | ICD-10-CM | POA: Diagnosis not present

## 2017-07-19 DIAGNOSIS — I351 Nonrheumatic aortic (valve) insufficiency: Secondary | ICD-10-CM | POA: Diagnosis not present

## 2017-07-19 DIAGNOSIS — J9611 Chronic respiratory failure with hypoxia: Secondary | ICD-10-CM | POA: Diagnosis not present

## 2017-07-19 DIAGNOSIS — I2723 Pulmonary hypertension due to lung diseases and hypoxia: Secondary | ICD-10-CM | POA: Diagnosis not present

## 2017-07-19 DIAGNOSIS — L03115 Cellulitis of right lower limb: Secondary | ICD-10-CM | POA: Diagnosis not present

## 2017-07-19 DIAGNOSIS — I361 Nonrheumatic tricuspid (valve) insufficiency: Secondary | ICD-10-CM | POA: Diagnosis not present

## 2017-07-19 DIAGNOSIS — Z9981 Dependence on supplemental oxygen: Secondary | ICD-10-CM | POA: Diagnosis not present

## 2017-07-19 DIAGNOSIS — I10 Essential (primary) hypertension: Secondary | ICD-10-CM | POA: Diagnosis not present

## 2017-07-19 DIAGNOSIS — S81811D Laceration without foreign body, right lower leg, subsequent encounter: Secondary | ICD-10-CM | POA: Diagnosis not present

## 2017-07-19 DIAGNOSIS — Z7901 Long term (current) use of anticoagulants: Secondary | ICD-10-CM | POA: Diagnosis not present

## 2017-07-19 DIAGNOSIS — I480 Paroxysmal atrial fibrillation: Secondary | ICD-10-CM | POA: Diagnosis not present

## 2017-07-19 DIAGNOSIS — Z87891 Personal history of nicotine dependence: Secondary | ICD-10-CM | POA: Diagnosis not present

## 2017-07-19 DIAGNOSIS — J449 Chronic obstructive pulmonary disease, unspecified: Secondary | ICD-10-CM | POA: Diagnosis not present

## 2017-07-19 MED ORDER — AMOXICILLIN-POT CLAVULANATE 875-125 MG PO TABS: 1.0000 | ORAL_TABLET | Freq: Two times a day (BID) | ORAL | 0 refills | Status: DC

## 2017-07-19 NOTE — Telephone Encounter (Signed)
Andrea [name], home health RN called regarding concern for redness of the leg. The patient thinks it is about the same, but given the concern will place back on Augmentin for next 10 days.   Will see patient Thursday. If getting worse she will call.  Curlene Labrum, MD [facility] [address] [place], [place] [address] [phone] (office)

## 2017-07-21 ENCOUNTER — Ambulatory Visit (INDEPENDENT_AMBULATORY_CARE_PROVIDER_SITE_OTHER): Payer: Self-pay | Admitting: General Surgery

## 2017-07-21 ENCOUNTER — Encounter: Payer: Self-pay | Admitting: General Surgery

## 2017-07-21 VITALS — BP 140/57 | HR 63 | Temp 98.2°F | Resp 18 | Ht 63.0 in | Wt 102.0 lb

## 2017-07-21 DIAGNOSIS — L089 Local infection of the skin and subcutaneous tissue, unspecified: Secondary | ICD-10-CM

## 2017-07-21 DIAGNOSIS — S81801S Unspecified open wound, right lower leg, sequela: Principal | ICD-10-CM

## 2017-07-21 NOTE — Progress Notes (Signed)
[facility] Note   HPI:  83 y.o. Female presents to clinic for a wound check. I was called by her home health RN concerned about redness in her leg and swelling. Started the patient on another course of Augmentin for possible celluilitis earlier this week until seeing her today. She denise any fevers or chills, they have been doing the silvadene cream. She took her lasix twice daily for 3 days with some improvement in the leg swelling. [place] last visit was negative for DVT and labs with Cr 0.91.   Review of Systems:  No fevers or chills Some drainage from medial portion of wound, redness and swelling leg Pain in leg  All other review of systems: otherwise negative   Vital Signs:  BP (!) 140/57   Pulse 63   Temp 98.2 F (36.8 C)   Resp 18   Ht _0  (1.6 m)   Wt 102 lb (46.3 kg)   BMI 18.07 kg/m    Physical Exam:  Physical Exam  Constitutional: She is oriented to person, place, and time and well-developed, well-nourished, and in no distress.  Cardiovascular: Normal rate.   Pulmonary/Chest: Effort normal.  Musculoskeletal: Normal range of motion. She exhibits edema and tenderness.  Blanching redness around wound, removed xerofrom from lateral aspect, and scab revealed, medial portion with some drainage, tender around medial portion of wound  Neurological: She is alert and oriented to person, place, and time.  Skin: Skin is warm and dry.  Vitals reviewed.     Laboratory studies: None   Imaging:  [place]- RLQ   FINDINGS: Contralateral Common Femoral Vein: Respiratory phasicity is normal and symmetric with the symptomatic side. No evidence of thrombus. Normal compressibility.  Common Femoral Vein: No evidence of thrombus. Normal compressibility, respiratory phasicity and response to augmentation.  Saphenofemoral Junction: No evidence of thrombus. Normal compressibility and flow on color Doppler imaging.  Profunda Femoral Vein: No evidence of thrombus.  Normal compressibility and flow on color Doppler imaging.  Femoral Vein: No evidence of thrombus. Normal compressibility, respiratory phasicity and response to augmentation.  Popliteal Vein: No evidence of thrombus. Normal compressibility, respiratory phasicity and response to augmentation.  Calf Veins: Limited visualization . No evidence of thrombus. Normal compressibility and flow on color Doppler imaging.  Superficial Great Saphenous Vein: No evidence of thrombus. Normal compressibility.  Other Findings:  None.  IMPRESSION: No evidence of deep venous thrombosis.  Assessment:  83 y.o. yo Female with a traumatic right leg wound that is slowing healing and some degree of stasis dermatitis/ swelling.  I do not think the majority of this redness is cellulitis, but will have her complete the course of the Augmentin for now.  Plan:  - Will have patient's daughter wash leg with soap an water, pat the area dry, clean off dead flanking skin - Apply silvadene to whole area after shower and apply dry gauze  - Complete Augmentin - Wrap leg from foot to knee with ace wrap for swelling - Lasix 61m for next 4 days - Will plan to get labs for Cr next week   Future Appointments Date Time Provider [place] 07/26/2017 11:15 AM BVirl Cagey MD RS-RS None  08/16/2017 2:30 PM MUnk Pinto MD GAAM-GAAIM None  09/22/2017 3:40 PM CLelon Perla MD [facility] 10/05/2017 3:00 PM CVicie Mutters PA-C GAAM-GAA[facility] None     All of the above recommendations were discussed with the patient and patient's family, and all of patient's and family's  questions were answered to their expressed satisfaction.  Curlene Labrum, MD [facility] [address] [place],  [address] [phone] (office)

## 2017-07-21 NOTE — Patient Instructions (Signed)
Wash leg with soap and water and get dry skin off.  You can shower daily. Pat the wound dry.  Place silvadene cream on the wound and wrap with dry gauze. Wrap ace wrap around foot to lower leg below knee and elevate foot as much as possible. Take 40 mg of lasix for the next 4 days until seeing me in the clinic again.

## 2017-07-25 DIAGNOSIS — I351 Nonrheumatic aortic (valve) insufficiency: Secondary | ICD-10-CM | POA: Diagnosis not present

## 2017-07-25 DIAGNOSIS — Z9981 Dependence on supplemental oxygen: Secondary | ICD-10-CM | POA: Diagnosis not present

## 2017-07-25 DIAGNOSIS — Z7901 Long term (current) use of anticoagulants: Secondary | ICD-10-CM | POA: Diagnosis not present

## 2017-07-25 DIAGNOSIS — J449 Chronic obstructive pulmonary disease, unspecified: Secondary | ICD-10-CM | POA: Diagnosis not present

## 2017-07-25 DIAGNOSIS — I340 Nonrheumatic mitral (valve) insufficiency: Secondary | ICD-10-CM | POA: Diagnosis not present

## 2017-07-25 DIAGNOSIS — I480 Paroxysmal atrial fibrillation: Secondary | ICD-10-CM | POA: Diagnosis not present

## 2017-07-25 DIAGNOSIS — L03115 Cellulitis of right lower limb: Secondary | ICD-10-CM | POA: Diagnosis not present

## 2017-07-25 DIAGNOSIS — J9611 Chronic respiratory failure with hypoxia: Secondary | ICD-10-CM | POA: Diagnosis not present

## 2017-07-25 DIAGNOSIS — S81811D Laceration without foreign body, right lower leg, subsequent encounter: Secondary | ICD-10-CM | POA: Diagnosis not present

## 2017-07-25 DIAGNOSIS — I361 Nonrheumatic tricuspid (valve) insufficiency: Secondary | ICD-10-CM | POA: Diagnosis not present

## 2017-07-25 DIAGNOSIS — Z87891 Personal history of nicotine dependence: Secondary | ICD-10-CM | POA: Diagnosis not present

## 2017-07-25 DIAGNOSIS — I10 Essential (primary) hypertension: Secondary | ICD-10-CM | POA: Diagnosis not present

## 2017-07-25 DIAGNOSIS — I2723 Pulmonary hypertension due to lung diseases and hypoxia: Secondary | ICD-10-CM | POA: Diagnosis not present

## 2017-07-25 DIAGNOSIS — E43 Unspecified severe protein-calorie malnutrition: Secondary | ICD-10-CM | POA: Diagnosis not present

## 2017-07-26 ENCOUNTER — Encounter: Payer: Self-pay | Admitting: General Surgery

## 2017-07-26 ENCOUNTER — Ambulatory Visit (INDEPENDENT_AMBULATORY_CARE_PROVIDER_SITE_OTHER): Payer: Self-pay | Admitting: General Surgery

## 2017-07-26 VITALS — BP 135/59 | HR 63 | Temp 98.7°F | Resp 18 | Ht 63.0 in | Wt 101.0 lb

## 2017-07-26 DIAGNOSIS — N289 Disorder of kidney and ureter, unspecified: Secondary | ICD-10-CM

## 2017-07-26 DIAGNOSIS — Z09 Encounter for follow-up examination after completed treatment for conditions other than malignant neoplasm: Secondary | ICD-10-CM | POA: Diagnosis not present

## 2017-07-26 DIAGNOSIS — M7989 Other specified soft tissue disorders: Secondary | ICD-10-CM

## 2017-07-26 DIAGNOSIS — S81801D Unspecified open wound, right lower leg, subsequent encounter: Secondary | ICD-10-CM

## 2017-07-26 DIAGNOSIS — L089 Local infection of the skin and subcutaneous tissue, unspecified: Secondary | ICD-10-CM

## 2017-07-26 NOTE — Patient Instructions (Signed)
Silvadene twice daily to the leg wound. Keep elevated and wrapped. Lasix 66m daily for now. Labs to check kidney function today.

## 2017-07-26 NOTE — Progress Notes (Signed)
[facility] Note   HPI:  83 y.o. Female presents to clinic for wound check. She is doing well. She took 35m of lasix for the last 4 days to help with swelling. She has been wrapping the right leg.  She has seen improvement in the wound.   Review of Systems:  No Fevers, chills + improved swelling on right  All other review of systems: otherwise negative   Vital Signs:  BP (!) 135/59   Pulse 63   Temp 98.7 F (37.1 C)   Resp 18   Ht _0  (1.6 m)   Wt 101 lb (45.8 kg)   BMI 17.89 kg/m    Physical Exam:  Physical Exam  Constitutional: She is oriented to person, place, and time and well-developed, well-nourished, and in no distress.  Cardiovascular: Normal rate.   Pulmonary/Chest: Effort normal.  Musculoskeletal: Normal range of motion.  Right leg wound with small fibrinous area medial, improved swelling and erythema on inferior portion   Neurological: She is alert and oriented to person, place, and time.  Vitals reviewed.     Laboratory studies: BMP ordered today    Imaging:  None   Assessment:  83y.o. yo Female with right leg traumatic leg wound s/p debridement. Following for wound healing that is slowed.   Plan:  - Finish antibiotic   - Lasix 236mdaily for now - BMP to assess kidney function given the Lasix need  - Silvadene twice daily and wrap leg with ace/ and elevate  - Follow up 2 weeks - Will call with BMP results   Future Appointments Date Time Provider [place]11/15/2018 2:00 PM BrVirl CageyMD RS-RS None  08/16/2017 2:30 PM McUnk PintoMD GAAM-GAAIM None  09/22/2017 3:40 PM CrLelon PerlaMD [facility]/05/2018 3:00 PM CoVicie MuttersPA-C GAAM-GAAIM None     All of the above recommendations were discussed with the patient and patient's family, and all of patient's and family's questions were answered to their expressed satisfaction.  LiCurlene LabrumMD [facility] [phone])

## 2017-07-27 ENCOUNTER — Telehealth: Payer: Self-pay | Admitting: General Surgery

## 2017-07-27 LAB — BASIC METABOLIC PANEL
BUN/Creatinine Ratio: 19 (ref 12–28)
BUN: 20 mg/dL (ref 8–27)
CALCIUM: 9.5 mg/dL (ref 8.7–10.3)
CO2: 29 mmol/L (ref 20–29)
CREATININE: 1.04 mg/dL — AB (ref 0.57–1.00)
Chloride: 92 mmol/L — ABNORMAL LOW (ref 96–106)
GFR calc Af Amer: 56 mL/min/{1.73_m2} — ABNORMAL LOW (ref >59)
GFR, EST NON AFRICAN AMERICAN: 49 mL/min/{1.73_m2} — AB (ref >59)
GLUCOSE: 89 mg/dL (ref 65–99)
Potassium: 4.9 mmol/L (ref 3.5–5.2)
Sodium: 139 mmol/L (ref 134–144)

## 2017-07-27 NOTE — Telephone Encounter (Signed)
Cr up slightly to 1.04. Have been doing more lasix to help with edema with leg wound. Has had Cr to 1.2 in past. Given the increase, holding on lasix for now. She can take PRN as she has been in the past.  Called and updated patient.   Curlene Labrum, MD [facility] [address] [place], [place] [address] [phone] (office)

## 2017-08-01 ENCOUNTER — Other Ambulatory Visit: Payer: Self-pay

## 2017-08-01 NOTE — Patient Outreach (Signed)
[facility]) Care Management  08/01/2017  IDIL MASLANKA 03-20-1929 [id number]   Medication Adherence call to Mrs. Keirston Saephanh patient is showing past due under [facility] [employer]on Losartan 100 mg spoke to patient she is no longer taking this medication doctor Jolaine Artist Memom took her off.  [facility] Management Direct Dial [phone]  Fax [phone] [email or web address]

## 2017-08-02 ENCOUNTER — Telehealth: Payer: Self-pay | Admitting: General Surgery

## 2017-08-02 DIAGNOSIS — L03115 Cellulitis of right lower limb: Secondary | ICD-10-CM | POA: Diagnosis not present

## 2017-08-02 DIAGNOSIS — Z87891 Personal history of nicotine dependence: Secondary | ICD-10-CM | POA: Diagnosis not present

## 2017-08-02 DIAGNOSIS — I10 Essential (primary) hypertension: Secondary | ICD-10-CM | POA: Diagnosis not present

## 2017-08-02 DIAGNOSIS — I480 Paroxysmal atrial fibrillation: Secondary | ICD-10-CM | POA: Diagnosis not present

## 2017-08-02 DIAGNOSIS — I351 Nonrheumatic aortic (valve) insufficiency: Secondary | ICD-10-CM | POA: Diagnosis not present

## 2017-08-02 DIAGNOSIS — I361 Nonrheumatic tricuspid (valve) insufficiency: Secondary | ICD-10-CM | POA: Diagnosis not present

## 2017-08-02 DIAGNOSIS — J9611 Chronic respiratory failure with hypoxia: Secondary | ICD-10-CM | POA: Diagnosis not present

## 2017-08-02 DIAGNOSIS — Z9981 Dependence on supplemental oxygen: Secondary | ICD-10-CM | POA: Diagnosis not present

## 2017-08-02 DIAGNOSIS — Z7901 Long term (current) use of anticoagulants: Secondary | ICD-10-CM | POA: Diagnosis not present

## 2017-08-02 DIAGNOSIS — S81811D Laceration without foreign body, right lower leg, subsequent encounter: Secondary | ICD-10-CM | POA: Diagnosis not present

## 2017-08-02 DIAGNOSIS — E43 Unspecified severe protein-calorie malnutrition: Secondary | ICD-10-CM | POA: Diagnosis not present

## 2017-08-02 DIAGNOSIS — J449 Chronic obstructive pulmonary disease, unspecified: Secondary | ICD-10-CM | POA: Diagnosis not present

## 2017-08-02 DIAGNOSIS — I2723 Pulmonary hypertension due to lung diseases and hypoxia: Secondary | ICD-10-CM | POA: Diagnosis not present

## 2017-08-02 DIAGNOSIS — I340 Nonrheumatic mitral (valve) insufficiency: Secondary | ICD-10-CM | POA: Diagnosis not present

## 2017-08-02 NOTE — Telephone Encounter (Signed)
Patient with b/l leg swelling. Told her to take her lasix as she normally would and not to do the 72m which we had done for the wound swelling.  She understands. Will follow up as scheduled.  LCurlene Labrum MD [facility]SLincoln [place] [phone](office)

## 2017-08-05 DIAGNOSIS — J449 Chronic obstructive pulmonary disease, unspecified: Secondary | ICD-10-CM | POA: Diagnosis not present

## 2017-08-09 ENCOUNTER — Other Ambulatory Visit: Payer: Self-pay | Admitting: Internal Medicine

## 2017-08-11 ENCOUNTER — Encounter: Payer: Self-pay | Admitting: General Surgery

## 2017-08-11 ENCOUNTER — Ambulatory Visit (INDEPENDENT_AMBULATORY_CARE_PROVIDER_SITE_OTHER): Payer: Medicare Other | Admitting: General Surgery

## 2017-08-11 VITALS — BP 133/56 | HR 64 | Temp 98.9°F | Resp 18 | Ht 63.0 in | Wt 109.0 lb

## 2017-08-11 DIAGNOSIS — L089 Local infection of the skin and subcutaneous tissue, unspecified: Secondary | ICD-10-CM

## 2017-08-11 DIAGNOSIS — S81801D Unspecified open wound, right lower leg, subsequent encounter: Secondary | ICD-10-CM

## 2017-08-11 NOTE — Progress Notes (Signed)
[facility] Note   HPI:  83 y.o. Female presents to clinic for a wound check. She has been taking her lasix PRN, and notes continued swelling in the right leg although improved.  She denies any redness but reports that the area drains some.   Review of Systems:  No fevers or chills  All other review of systems: otherwise negative   Vital Signs:  BP (!) 133/56   Pulse 64   Temp 98.9 F (37.2 C)   Resp 18   Ht _0  (1.6 m)   Wt 109 lb (49.4 kg)   BMI 19.31 kg/m    Physical Exam:  Physical Exam  Constitutional: She is well-developed, well-nourished, and in no distress.  Cardiovascular: Normal rate and regular rhythm.  Pulmonary/Chest: Effort normal.  Musculoskeletal:  Dry skin on right leg, some swelling, lateral wound dry with eschar, medial portion with some granulation tissue, minimal fibrinous drainage  Vitals reviewed.    Procedure- given the granulation, I took two silvernitrate sticks to the area to help limit hypertrophy of the granulation and scar. The skin of the lower leg was cleaned with saline and attempts at removing dry skin was performed. An unna boot was placed on the right lower extremity to aid in reducing the edema in the leg and to help with healing given that this lesion is superficial at this point   Laboratory studies: None   Imaging:  None   Assessment:  83 y.o. yo Female with a traumatic wound of the right leg who continues to heal slowly.  She has edema in the leg and this is preventing further healing at this time. We have tried to double her lasix but her creatinine elevated with this maneuver.  She does not keep an ace bandage on the area like she needs to per her daughter.  Plan:  - Unnaboot placed to see if we can aid in decreasing the venous congestion   - Lasix every other day as previously doing  - If she has significant trouble with the unna boot she can have her daughter remove it, and her daughter is able and willing     Future Appointments  Date Time Provider [place]  08/16/2017 11:45 AM [name], MD RS-RS None  08/16/2017  2:30 PM Unk Pinto, MD GA[facility] None  09/22/2017  3:40 PM Lelon Perla, MD [facility]  10/05/2017  3:00 PM Vicie Mutters, PA-C GA[facility] None   All of the above recommendations were discussed with the patient and patient's family, and all of patient's and family's questions were answered to their expressed satisfaction.  Curlene Labrum, MD [facility] [address] [place], [place] [address] [phone] (office)

## 2017-08-12 ENCOUNTER — Other Ambulatory Visit: Payer: Self-pay | Admitting: Internal Medicine

## 2017-08-16 ENCOUNTER — Ambulatory Visit (INDEPENDENT_AMBULATORY_CARE_PROVIDER_SITE_OTHER): Payer: Medicare Other | Admitting: Physician Assistant

## 2017-08-16 ENCOUNTER — Encounter: Payer: Self-pay | Admitting: Physician Assistant

## 2017-08-16 ENCOUNTER — Ambulatory Visit (INDEPENDENT_AMBULATORY_CARE_PROVIDER_SITE_OTHER): Payer: Medicare Other | Admitting: General Surgery

## 2017-08-16 ENCOUNTER — Ambulatory Visit: Payer: Self-pay | Admitting: Internal Medicine

## 2017-08-16 VITALS — BP 134/56 | HR 69 | Temp 97.9°F | Wt 106.8 lb

## 2017-08-16 VITALS — BP 141/58 | HR 63 | Temp 98.7°F | Resp 18 | Ht 63.0 in | Wt 105.0 lb

## 2017-08-16 DIAGNOSIS — J449 Chronic obstructive pulmonary disease, unspecified: Secondary | ICD-10-CM

## 2017-08-16 DIAGNOSIS — Z0001 Encounter for general adult medical examination with abnormal findings: Secondary | ICD-10-CM | POA: Diagnosis not present

## 2017-08-16 DIAGNOSIS — L089 Local infection of the skin and subcutaneous tissue, unspecified: Secondary | ICD-10-CM

## 2017-08-16 DIAGNOSIS — J438 Other emphysema: Secondary | ICD-10-CM

## 2017-08-16 DIAGNOSIS — E43 Unspecified severe protein-calorie malnutrition: Secondary | ICD-10-CM

## 2017-08-16 DIAGNOSIS — B370 Candidal stomatitis: Secondary | ICD-10-CM

## 2017-08-16 DIAGNOSIS — Z79899 Other long term (current) drug therapy: Secondary | ICD-10-CM | POA: Diagnosis not present

## 2017-08-16 DIAGNOSIS — J9611 Chronic respiratory failure with hypoxia: Secondary | ICD-10-CM

## 2017-08-16 DIAGNOSIS — A09 Infectious gastroenteritis and colitis, unspecified: Secondary | ICD-10-CM | POA: Diagnosis not present

## 2017-08-16 DIAGNOSIS — I5032 Chronic diastolic (congestive) heart failure: Secondary | ICD-10-CM | POA: Diagnosis not present

## 2017-08-16 DIAGNOSIS — I471 Supraventricular tachycardia: Secondary | ICD-10-CM

## 2017-08-16 DIAGNOSIS — I10 Essential (primary) hypertension: Secondary | ICD-10-CM | POA: Diagnosis not present

## 2017-08-16 DIAGNOSIS — E611 Iron deficiency: Secondary | ICD-10-CM

## 2017-08-16 DIAGNOSIS — R6889 Other general symptoms and signs: Secondary | ICD-10-CM | POA: Diagnosis not present

## 2017-08-16 DIAGNOSIS — Z7189 Other specified counseling: Secondary | ICD-10-CM | POA: Diagnosis not present

## 2017-08-16 DIAGNOSIS — I480 Paroxysmal atrial fibrillation: Secondary | ICD-10-CM | POA: Diagnosis not present

## 2017-08-16 DIAGNOSIS — S81801D Unspecified open wound, right lower leg, subsequent encounter: Secondary | ICD-10-CM

## 2017-08-16 DIAGNOSIS — Z0000 Encounter for general adult medical examination without abnormal findings: Secondary | ICD-10-CM

## 2017-08-16 DIAGNOSIS — C4490 Unspecified malignant neoplasm of skin, unspecified: Secondary | ICD-10-CM

## 2017-08-16 DIAGNOSIS — R918 Other nonspecific abnormal finding of lung field: Secondary | ICD-10-CM

## 2017-08-16 DIAGNOSIS — E785 Hyperlipidemia, unspecified: Secondary | ICD-10-CM

## 2017-08-16 DIAGNOSIS — I2723 Pulmonary hypertension due to lung diseases and hypoxia: Secondary | ICD-10-CM | POA: Diagnosis not present

## 2017-08-16 DIAGNOSIS — E038 Other specified hypothyroidism: Secondary | ICD-10-CM | POA: Diagnosis not present

## 2017-08-16 DIAGNOSIS — L03115 Cellulitis of right lower limb: Secondary | ICD-10-CM

## 2017-08-16 DIAGNOSIS — N289 Disorder of kidney and ureter, unspecified: Secondary | ICD-10-CM

## 2017-08-16 DIAGNOSIS — E782 Mixed hyperlipidemia: Secondary | ICD-10-CM

## 2017-08-16 DIAGNOSIS — M19019 Primary osteoarthritis, unspecified shoulder: Secondary | ICD-10-CM

## 2017-08-16 DIAGNOSIS — M810 Age-related osteoporosis without current pathological fracture: Secondary | ICD-10-CM

## 2017-08-16 DIAGNOSIS — E559 Vitamin D deficiency, unspecified: Secondary | ICD-10-CM

## 2017-08-16 DIAGNOSIS — I700 Atherosclerosis of aorta: Secondary | ICD-10-CM

## 2017-08-16 LAB — BASIC METABOLIC PANEL WITH GFR
BUN / CREAT RATIO: 15 (calc) (ref 6–22)
BUN: 13 mg/dL (ref 7–25)
CHLORIDE: 100 mmol/L (ref 98–110)
CO2: 34 mmol/L — ABNORMAL HIGH (ref 20–32)
Calcium: 8.9 mg/dL (ref 8.6–10.4)
Creat: 0.89 mg/dL — ABNORMAL HIGH (ref 0.60–0.88)
GFR, EST AFRICAN AMERICAN: 68 mL/min/{1.73_m2} (ref >=60)
GFR, Est Non African American: 59 mL/min/{1.73_m2} — ABNORMAL LOW (ref >=60)
GLUCOSE: 96 mg/dL (ref 65–99)
POTASSIUM: 4.4 mmol/L (ref 3.5–5.3)
SODIUM: 141 mmol/L (ref 135–146)

## 2017-08-16 LAB — CBC WITH DIFFERENTIAL/PLATELET
BASOS PCT: 0.2 %
Basophils Absolute: 26 cells/uL (ref 0–200)
EOS ABS: 145 {cells}/uL (ref 15–500)
Eosinophils Relative: 1.1 %
HCT: 35.4 % (ref 35.0–45.0)
Hemoglobin: 11.6 g/dL — ABNORMAL LOW (ref 11.7–15.5)
Lymphs Abs: 1769 cells/uL (ref 850–3900)
MCH: 29 pg (ref 27.0–33.0)
MCHC: 32.8 g/dL (ref 32.0–36.0)
MCV: 88.5 fL (ref 80.0–100.0)
MONOS PCT: 6.7 %
MPV: 10.9 fL (ref 7.5–12.5)
NEUTROS PCT: 78.6 %
Neutro Abs: 10375 cells/uL — ABNORMAL HIGH (ref 1500–7800)
PLATELETS: 336 10*3/uL (ref 140–400)
RBC: 4 10*6/uL (ref 3.80–5.10)
RDW: 11.5 % (ref 11.0–15.0)
TOTAL LYMPHOCYTE: 13.4 %
WBC mixed population: 884 cells/uL (ref 200–950)
WBC: 13.2 10*3/uL — AB (ref 3.8–10.8)

## 2017-08-16 LAB — TSH: TSH: 3.96 m[IU]/L (ref 0.40–4.50)

## 2017-08-16 LAB — LIPID PANEL
Cholesterol: 211 mg/dL — ABNORMAL HIGH (ref <200)
HDL: 70 mg/dL (ref >50)
LDL CHOLESTEROL (CALC): 121 mg/dL — AB
Non-HDL Cholesterol (Calc): 141 mg/dL (calc) — ABNORMAL HIGH (ref <130)
Total CHOL/HDL Ratio: 3 (calc) (ref <5.0)
Triglycerides: 97 mg/dL (ref <150)

## 2017-08-16 LAB — HEPATIC FUNCTION PANEL
AG Ratio: 0.9 (calc) — ABNORMAL LOW (ref 1.0–2.5)
ALKALINE PHOSPHATASE (APISO): 78 U/L (ref 33–130)
ALT: 8 U/L (ref 6–29)
AST: 14 U/L (ref 10–35)
Albumin: 3.3 g/dL — ABNORMAL LOW (ref 3.6–5.1)
BILIRUBIN DIRECT: 0.1 mg/dL (ref 0.0–0.2)
BILIRUBIN INDIRECT: 0.2 mg/dL (ref 0.2–1.2)
GLOBULIN: 3.7 g/dL (ref 1.9–3.7)
TOTAL PROTEIN: 7 g/dL (ref 6.1–8.1)
Total Bilirubin: 0.3 mg/dL (ref 0.2–1.2)

## 2017-08-16 LAB — IRON, TOTAL/TOTAL IRON BINDING CAP
%SAT: 9 % — AB (ref 11–50)
IRON: 30 ug/dL — AB (ref 45–160)
TIBC: 320 ug/dL (ref 250–450)

## 2017-08-16 LAB — MAGNESIUM: MAGNESIUM: 1.7 mg/dL (ref 1.5–2.5)

## 2017-08-16 MED ORDER — NYSTATIN 100000 UNIT/ML MT SUSP: OROMUCOSAL | 0 refills | Status: DC

## 2017-08-16 NOTE — Patient Instructions (Signed)
Try to do 3 glasses of water a day  Picture a ball on your leg and let the ball goes toward you  Try to do 1 ensure a day  Go to the ER if any chest pain, shortness of breath, nausea, dizziness, severe HA, changes vision/speech  VENOUS INSUFFICIENCY Our lower leg venous system is not the most reliable, the heart does NOT pump fluid up, there is a valve system.  The muscles of the leg squeeze and the blood moves up and a valve opens and close, then they squeeze, blood moves up and valves open and closes keeping the blood moving towards the heart.  Lots can go wrong with this valve system.  If someone is sitting or standing without movement, everyone will get swelling.  THINGS TO DO:  Do not stand or sit in one position for long periods of time. Do not sit with your legs crossed. Rest with your legs raised during the day.  Your legs have to be higher than your heart so that gravity will force the valves to open, so please really elevate your legs.   Wear elastic stockings or support hose. Do not wear other tight, encircling garments around the legs, pelvis, or waist.  [employer]  has a wide variety of well priced compression stockings. [place], [place] [id number] [id number] has a good cheap selection, I like the socks, they are not as hard to get on  Walk as much as possible to increase blood flow.  Raise the foot of your bed at night with 2-inch blocks.  SEEK MEDICAL CARE IF:   The skin around your ankle starts to break down.  You have pain, redness, tenderness, or hard swelling developing in your leg over a vein.  You are uncomfortable due to leg pain.  If you ever have shortness of breath with exertion or chest pain go to the ER.

## 2017-08-16 NOTE — Progress Notes (Signed)
6 MONTH FOLLOW UP AND MEDICARE WELLNESS  Assessment and Plan:  Thrush -     nystatin (MYCOSTATIN) 100000 UNIT/ML suspension; 5 ml four times a day, retain in mouth as long as possible (Swish and Spit).  Use for 48 hours after symptoms resolve. With recent ABX  Diarrhea of infectious origin -     Clostridium difficile culture-fecal - with multiple recent ABX and hospitilization rule out cdiff - + hemoccult, will refer to GI -Continue elliquis for now, check CBC with questionable recent melana  Essential hypertension - continue medications, DASH diet, exercise and monitor at home. Call if greater than 130/80.  -     CBC with Differential/Platelet -     BASIC METABOLIC PANEL WITH GFR -     Hepatic function panel -     TSH  Pulmonary hypertension due to COPD (HCC) No triggers, well controlled symptoms, cont to monitor/follow up/O2  Chronic diastolic CHF (congestive heart failure) (HCC) Weight stable, monitor  Atrial tachycardia (HCC) Continue meds  Aortic atherosclerosis (HCC) Control blood pressure, cholesterol, glucose, increase exercise.   AF (paroxysmal atrial fibrillation) (HCC) Continue elliquis for now, check CBC with questionable recent melana  Other emphysema (HCC) No triggers, well controlled symptoms, cont to monitor  Chronic respiratory failure with hypoxia ([place]) cont to monitor/follow up/O2  Other specified hypothyroidism -     TSH  Medication management -     Magnesium  Hyperlipidemia, unspecified hyperlipidemia type -     Lipid panel  Iron deficiency -     Iron,Total/Total Iron Binding Cap  Encounter for Medicare annual wellness exam 1 year  Advanced care planning/counseling discussion Discussed advanced care planning with patient and daughter, given information to start converstation PAtient states has not thought of it before At least 30 mins discussed with the patient and/or family at the visit.   Skin cancer Monitor  Osteoporosis,  unspecified osteoporosis type, unspecified pathological fracture presence Monitor  Renal insufficiency Increase fluids, avoid NSAIDS, monitor sugars, will monitor  Osteoarthritis of shoulder, unspecified laterality, unspecified osteoarthritis type Monitor  Vitamin D deficiency Supplement  Traumatic open wound of right lower leg with infection, subsequent encounter Following with gen surgeon, saw today  Protein-calorie malnutrition, severe ([place]) Samples, get on ensure once daily  Lung nodules Monitor  Cellulitis of right lower extremity Following with gen surgeon, saw today  Discussed med's effects and SE's. Screening labs and tests as requested with regular follow-up as recommended. Future Appointments  Date Time Provider [place]  08/30/2017 11:15 AM Andrea Cagey, MD [facility] None  09/22/2017  3:40 PM Andrea Perla, MD [facility]  10/05/2017  3:00 PM Andrea Mutters, PA-C [facility] None    Plan:   During the course of the visit the patient was educated and counseled about appropriate screening and preventive services including:    Pneumococcal vaccine   Prevnar 13  Influenza vaccine  Td vaccine  Screening electrocardiogram  Bone densitometry screening  Colorectal cancer screening  Diabetes screening  Glaucoma screening  Nutrition counseling   Advanced directives: requested    HPI  83 y.o. Andrea [name]  presents for 6 month follow up and medicare wellness.   She complains of jelly/loose stools x 1 week with weakness, having 3 BMs a day low volume, in the beginning had 10 stools, admits to poor PO intake. Has had SEVERAL antibiotics in the past month due to ulcer on his leg. Has had weakness, only drink 16 oz a day. Only taken 1 lasix in  past two weeks. Has been dark but has taken 2 doses of pepto, states BM is still black.  Has needed her O2 in the afternoon as well. No fever, chills. SHE IS ON ELIQUIS, 2.5 BID and has only been 1 a day  due to dark stools.   Feels mouth is dry and rough.   Has ulcer on right leg,had debridgement 06/29/2017, following surgeon, saw surgeon today, Dr. Constance [name], has had unna boot x 2 weeks.    Her blood pressure has been controlled at home, today their BP is BP: (!) 134/56  Recheck x 2 better.  She does workout. She denies chest pain, shortness of breath, dizziness.  She has COPD and follows with Dr. Annamaria [name],  she is now on O2 at night but not during the day.   She also follows with Dr. Stanford [name] for aortic insuff. She has history of peripheral edema, is on 1/2 amlodipine, and she is on lasix as needed.  Lab Results  Component Value Date   SEGBTDVV 61 (L) 07/26/2017   She is following Dr. Scherrie Andrea [name] for her eyes, and is driving at this time, taking driving test yearly.  She is not on cholesterol medication and denies myalgias. Her cholesterol is at goal. The cholesterol last visit was:   Lab Results  Component Value Date   CHOL 224 (H) 01/24/2017   HDL 106 01/24/2017   LDLCALC 104 (H) 01/24/2017   TRIG 72 01/24/2017   CHOLHDL 2.1 01/24/2017    She has been working on diet and exercise for prediabetes, and denies paresthesia of the feet, polydipsia, polyuria and visual disturbances. Last A1C in the office was:  Lab Results  Component Value Date   HGBA1C 5.4 01/24/2017   Patient is on Vitamin D supplement.   Lab Results  Component Value Date   VD25OH 52 01/24/2017     She is on thyroid medication. Her medication was changed last visit, increase dose to 1/5 pill daily except 1 pill on Friday and Sunday.  Lab Results  Component Value Date   TSH 0.936 04/04/2017   BMI is Body mass index is 18.92 kg/m., she is working on diet and exercise. Wt Readings from Last 3 Encounters:  08/16/17 106 lb 12.8 oz (48.4 kg)  08/11/17 109 lb (49.4 kg)  07/26/17 101 lb (45.8 kg)    Current Medications:  Current Outpatient Medications on File Prior to Visit  Medication Sig Dispense Refill  .  ALPRAZolam (XANAX) 0.25 MG tablet Take 0.25 mg by mouth at bedtime.     [name] apixaban (ELIQUIS) 2.5 MG TABS tablet Take 1 tablet (2.5 mg total) by mouth 2 (two) times daily. 180 tablet 3  . Ascorbic Acid (VITAMIN C) 500 MG tablet Take 500 mg by mouth daily.      . cholecalciferol (VITAMIN D) 1000 UNITS tablet Take 1,000 Units by mouth daily.     . clindamycin (CLEOCIN) 300 MG capsule Take 1 capsule (300 mg total) by mouth 3 (three) times daily. 30 capsule 0  . diltiazem (CARDIZEM) 90 MG tablet Take 90 mg by mouth 4 (four) times daily.    . feeding supplement, ENSURE ENLIVE, (ENSURE ENLIVE) LIQD Take 237 mLs by mouth 2 (two) times daily between meals. 237 mL 12  . furosemide (LASIX) 20 MG tablet Take 20 mg by mouth daily as needed for fluid.     [name] ipratropium-albuterol (DUONEB) 0.5-2.5 (3) MG/3ML SOLN Take 3 mLs by nebulization every 6 (six) hours as needed. 360 mL 1  .  levothyroxine (SYNTHROID, LEVOTHROID) 50 MCG tablet TAKE ONE TABLET BY MOUTH ONCE A DAY AS DIRECTED 90 tablet 1  . Metoprolol Tartrate 37.5 MG TABS Take 37.5 mg by mouth 2 (two) times daily. 180 tablet 3  . silver sulfADIAZINE (SILVADENE) 1 % cream Apply to affected area daily 50 g 1  . traMADol (ULTRAM) 50 MG tablet Take 1 tablet (50 mg total) by mouth every 6 (six) hours as needed. 20 tablet 0  . amoxicillin-clavulanate (AUGMENTIN) 875-125 MG tablet Take 1 tablet by mouth 2 (two) times daily. 20 tablet 0   No current facility-administered medications on file prior to visit.    Health Maintenance:   Immunization History  Administered Date(s) Administered  . Influenza Split 07/16/2013  . Influenza, High Dose Seasonal PF 06/25/2015, 06/10/2016, 06/30/2017  . Influenza,inj,Quad PF,6+ Mos 07/04/2014  . Pneumococcal Conjugate-13 02/26/2014  . Pneumococcal Polysaccharide-23 10/04/2016  . Td 06/26/2013  . Tdap 06/19/2017   Last colonoscopy: 2008, declines another Last mammogram: 07/06/2013, declines another Last pap smear/pelvic  exam: remote, declines another DEXA: 07/06/2013 + osteoporosis CXR 05/2016 Echo 03/2016 Dr. Stanford [name]  Prior vaccinations: TD or Tdap: 2014 Influenza: 05/2017 Pneumococcal: 2008 Prevnar 13: 02/2014 Shingles/Zostavax: declines  1. [facility]- here for primary care 2. Dr. Scherrie Andrea [name], eye doctor, last visit April 2017 3. unknown, dentist, last visit 2 years Patient Care Team: Unk Pinto, MD as PCP - General (Internal Medicine) Deneise Lever, MD as Consulting Physician (Pulmonary Disease) Andrea Perla, MD as Consulting Physician (Cardiology) Inda Castle, MD as Consulting Physician (Gastroenterology) Rolm Bookbinder, MD as Consulting Physician (Dermatology)  Medical History:  Past Medical History:  Diagnosis Date  . Aortic insufficiency 09/04/2015  . Aortic regurgitation   . Bronchiectasis ([place])   . COPD (chronic obstructive pulmonary disease) ([place])   . DJD (degenerative joint disease)   . HLD (hyperlipidemia)   . HTN (hypertension)   . Mild mitral regurgitation by prior echocardiogram   . Mild mitral regurgitation by prior echocardiogram   . Mild tricuspid regurgitation   . Palpitations    a. has known history of PAC's and PVC's. b. 09/2015: monitor showed sinus rhythm with occasional PAC's and a brief episode of PAT.  [name] Thyroid disease    Allergies Allergies  Allergen Reactions  . Vancomycin     Worsening kidney function  . Sulfa Antibiotics Palpitations    Unknown    SURGICAL HISTORY She  has a past surgical history that includes Abdominal hysterectomy (1993); Cataract extraction, bilateral; and Wound debridement (Right, 06/29/2017). FAMILY HISTORY Her family history includes Asthma in her brother; Cancer in her father; Diabetes in her brother, mother, and sister; Emphysema in her mother, sister, and sister; Heart attack in her sister; Rheumatic fever in her sister. SOCIAL HISTORY She  reports that she quit  smoking about 42 years ago. Her smoking use included cigarettes. She has a 20.00 pack-year smoking history. she has never used smokeless tobacco. She reports that she does not drink alcohol or use drugs.   MEDICARE WELLNESS OBJECTIVES: Physical activity: Current Exercise Habits: The patient does not participate in regular exercise at present Cardiac risk factors: Cardiac Risk Factors include: advanced age (>27mn, >>18women);dyslipidemia;hypertension;sedentary lifestyle Depression/mood screen:   Depression [facility] 08/16/2017  Decreased Interest 0  Down, Depressed, Hopeless 0  PHQ - 2 Score 0    ADLs:  In your present state of health, do you have any difficulty performing the following activities: 08/16/2017 06/28/2017  Hearing?  Y N  Vision? N N  Difficulty concentrating or making decisions? N N  Walking or climbing stairs? Y N  Dressing or bathing? N N  Doing errands, shopping? Y N  Some recent data might be hidden     Cognitive Testing  Alert? Yes  Normal Appearance?Yes  Oriented to person? Yes  Place? Yes   Time? Yes  Recall of three objects?  Yes  Can perform simple calculations? Yes  Displays appropriate judgment?Yes  Can read the correct time from a watch face?Yes  EOL planning: Does Patient Have a Medical Advance Directive?: No Would patient like information on creating a medical advance directive?: Yes (MAU/Ambulatory/Procedural Areas - Information given)(will think about it, long discussion with daughter and patient)   Review of Systems  Constitutional: Negative for chills, fever and malaise/fatigue.  HENT: Positive for hearing loss (going to see ENT). Negative for congestion, ear discharge and sore throat.   Respiratory: Negative for cough, shortness of breath and wheezing.   Cardiovascular: Negative for chest pain, palpitations and leg swelling.  Gastrointestinal: Negative for blood in stool, constipation, diarrhea and melena.  Genitourinary: Negative.    Skin: Negative.   Neurological: Negative for dizziness, sensory change, loss of consciousness and headaches.  Psychiatric/Behavioral: Negative for depression. The patient is not nervous/anxious and does not have insomnia.      Physical Exam: Estimated body mass index is 18.92 kg/m as calculated from the following:   Height as of 08/11/17: _0  (1.6 m).   Weight as of this encounter: 106 lb 12.8 oz (48.4 kg). BP (!) 134/56   Pulse 69   Temp 97.9 F (36.6 C)   Wt 106 lb 12.8 oz (48.4 kg)   SpO2 91%   BMI 18.92 kg/m  General Appearance: Malnourished/underweight in no apparent distress.  Eyes: PERRLA, EOMs, conjunctiva no swelling or erythema, normal fundi and vessels.  Sinuses: No Frontal/maxillary tenderness  ENT/Mouth: Ext aud canals clear on the right but with cerumen impaction on the left, normal light reflex with TMs without erythema, bulging. Good dentition. No erythema, swelling, or exudate on post pharynx. Tonsils not swollen or erythematous. Hearing decreased Neck: Supple, thyroid normal. No bruits  Respiratory: Respiratory effort normal, decreased breathsounds, BS equal bilaterally without rales, rhonchi, wheezing or stridor.  Cardio: RRR with holosystolic 2/6, prominent S2. Brisk peripheral pulses with 3+ edema, right leg wrapped.   Chest: symmetric, with normal excursions and percussion.   Abdomen: Soft, nontender, no guarding, rebound, hernias, masses, or organomegaly. .  Lymphatics: Non tender without lymphadenopathy.  Genitourinary: no masses, loose sphincter, + hemmocult, no hemorrhoids Musculoskeletal: Full ROM all peripheral extremities,4/5 strength, and antalgic gait with cane Skin: Warm, dry without rashes, lesions, ecchymosis. Neuro: Cranial nerves intact, reflexes equal bilaterally. Normal muscle tone, no cerebellar symptoms. [name]  Psych: Awake and oriented X 3, normal affect, Insight and Judgment appropriate.    Medicare Attestation I have personally  reviewed: The patient's medical and social history Their use of alcohol, tobacco or illicit drugs Their current medications and supplements The patient's functional ability including ADLs,fall risks, home safety risks, cognitive, and hearing and visual impairment Diet and physical activities Evidence for depression or mood disorders  The patient's weight, height, BMI, and visual acuity have been recorded in the chart.  I have made referrals, counseling, and provided education to the patient based on review of the above and I have provided the patient with a written personalized care plan for preventive services.     Andrea Andrea [name]  3:16 PM [facility]

## 2017-08-17 ENCOUNTER — Telehealth: Payer: Self-pay | Admitting: Cardiology

## 2017-08-17 ENCOUNTER — Encounter: Payer: Self-pay | Admitting: General Surgery

## 2017-08-17 DIAGNOSIS — A09 Infectious gastroenteritis and colitis, unspecified: Secondary | ICD-10-CM | POA: Diagnosis not present

## 2017-08-17 NOTE — Telephone Encounter (Signed)
New Message     Pt c/o medication issue:  1. Name of Medication: eliquis   2. How are you currently taking this medication (dosage and times per day)? 1 tab daily  3. Are you having a reaction (difficulty breathing--STAT)?  yes  4. What is your medication issue?  Blood in stool and now has diarrhea

## 2017-08-17 NOTE — Progress Notes (Signed)
[facility] Note   HPI:  83 y.o. Female presents to clinic for recent of her wound. I placed an Unna boot last week, and I wanted to evaluate her wound again. She has decreased swelling in the leg, but really minimal change in the wound. The medial portion continues to have some drainage and granulation.   The patient complains of some diarrhea and is seeing her PCP regarding this issue and her weakness. She is looking more frail today.  She thinks they are getting stool samples which is good given her antibiotic history and risk of C dif.   Review of Systems:  Weak and feels dehydrated Diarrhea All other review of systems: otherwise negative   Vital Signs:  BP (!) 141/58   Pulse 63   Temp 98.7 F (37.1 C)   Resp 18   Ht _0  (1.6 m)   Wt 105 lb (47.6 kg)   BMI 18.60 kg/m    Physical Exam:   Physical Exam  Constitutional: She is oriented to person, place, and time. She appears cachectic. No distress.  HENT:  Head: Normocephalic.  Cardiovascular: Normal rate.  Pulmonary/Chest: Effort normal.  Musculoskeletal:  Right leg wound with decreased swelling, dry eschar lateral and draining wound superficial medial  Neurological: She is alert and oriented to person, place, and time.  Skin: Skin is warm.  Psychiatric: Mood, memory, affect and judgment normal.  Vitals reviewed.     Laboratory studies: None   Imaging:  None   Assessment:  83 y.o. yo Female with a slow healing right lower extremity wound from trauma. I tried to see if an Unna boot and reducing the edema would provide any additional aid in healing this more quickly.  The swelling is better but the wound is minimally changed.  I do not see any need in replacing the Unna boot given that the patient does not like the constriction and we did not get a significant change.  Plan:  - Silvadene daily to medial aspect and keep dry otherwise, can wash with dial soap and water  - Elevate and wrap as much  as possible  - Follow up with PCP regarding diarrhea and other issues  - Will see in a few weeks and if no big improvements will likely need to be referred to the wound center for possible further care/ hyperbaric oxygen treatment   Future Appointments  Date Time Provider [place]  08/30/2017 11:15 AM [name], MD [facility] None  09/22/2017  3:40 PM Lelon Perla, MD [facility]  11/16/2017 11:00 AM Vicie Mutters, PA-C [facility] None   All of the above recommendations were discussed with the patient and patient's family, and all of patient's and family's questions were answered to their expressed satisfaction.  Curlene Labrum, MD [facility] [address] [place], [place] [address] [phone] (office)

## 2017-08-17 NOTE — Telephone Encounter (Signed)
Per Internal Medicine note yesterday patient is to continue taking Eliquis for now - trying to r/u C Diff  Also noted, Hgb improved from 10.7 to 11.6 (yesterday)  Patient agreed on continuation of eliquis, she will HOLD dose if amount of blood in stool increased.  Patient was encouraged to call clinic back if additional symptoms noted.

## 2017-08-19 ENCOUNTER — Emergency Department (HOSPITAL_COMMUNITY): Payer: Medicare Other

## 2017-08-19 ENCOUNTER — Emergency Department (HOSPITAL_COMMUNITY)
Admission: EM | Admit: 2017-08-19 | Discharge: 2017-08-19 | Disposition: A | Discharge status: Home / Self Care | Payer: Medicare Other | Attending: Emergency Medicine | Admitting: Emergency Medicine

## 2017-08-19 ENCOUNTER — Other Ambulatory Visit: Payer: Self-pay

## 2017-08-19 ENCOUNTER — Encounter (HOSPITAL_COMMUNITY): Payer: Self-pay | Admitting: Emergency Medicine

## 2017-08-19 DIAGNOSIS — E039 Hypothyroidism, unspecified: Secondary | ICD-10-CM | POA: Insufficient documentation

## 2017-08-19 DIAGNOSIS — Z85828 Personal history of other malignant neoplasm of skin: Secondary | ICD-10-CM | POA: Diagnosis not present

## 2017-08-19 DIAGNOSIS — I110 Hypertensive heart disease with heart failure: Secondary | ICD-10-CM | POA: Insufficient documentation

## 2017-08-19 DIAGNOSIS — J449 Chronic obstructive pulmonary disease, unspecified: Secondary | ICD-10-CM | POA: Insufficient documentation

## 2017-08-19 DIAGNOSIS — Z87891 Personal history of nicotine dependence: Secondary | ICD-10-CM | POA: Insufficient documentation

## 2017-08-19 DIAGNOSIS — Z7901 Long term (current) use of anticoagulants: Secondary | ICD-10-CM | POA: Diagnosis not present

## 2017-08-19 DIAGNOSIS — R197 Diarrhea, unspecified: Secondary | ICD-10-CM | POA: Diagnosis not present

## 2017-08-19 DIAGNOSIS — E860 Dehydration: Secondary | ICD-10-CM | POA: Insufficient documentation

## 2017-08-19 DIAGNOSIS — Z79899 Other long term (current) drug therapy: Secondary | ICD-10-CM | POA: Insufficient documentation

## 2017-08-19 DIAGNOSIS — I5032 Chronic diastolic (congestive) heart failure: Secondary | ICD-10-CM | POA: Diagnosis not present

## 2017-08-19 DIAGNOSIS — K5730 Diverticulosis of large intestine without perforation or abscess without bleeding: Secondary | ICD-10-CM | POA: Diagnosis not present

## 2017-08-19 LAB — URINALYSIS, ROUTINE W REFLEX MICROSCOPIC
BILIRUBIN URINE: NEGATIVE
Glucose, UA: 50 mg/dL — AB
HGB URINE DIPSTICK: NEGATIVE
KETONES UR: NEGATIVE mg/dL
NITRITE: NEGATIVE
PROTEIN: 30 mg/dL — AB
SPECIFIC GRAVITY, URINE: 1.015 (ref 1.005–1.030)
pH: 6 (ref 5.0–8.0)

## 2017-08-19 LAB — BASIC METABOLIC PANEL
Anion gap: 9 (ref 5–15)
BUN: 20 mg/dL (ref 6–20)
CHLORIDE: 97 mmol/L — AB (ref 101–111)
CO2: 32 mmol/L (ref 22–32)
CREATININE: 0.91 mg/dL (ref 0.44–1.00)
Calcium: 9.1 mg/dL (ref 8.9–10.3)
GFR calc non Af Amer: 56 mL/min — ABNORMAL LOW (ref >60)
Glucose, Bld: 103 mg/dL — ABNORMAL HIGH (ref 65–99)
POTASSIUM: 3.2 mmol/L — AB (ref 3.5–5.1)
SODIUM: 138 mmol/L (ref 135–145)

## 2017-08-19 LAB — CBC WITH DIFFERENTIAL/PLATELET
BASOS PCT: 0 %
Basophils Absolute: 0 10*3/uL (ref 0.0–0.1)
Eosinophils Absolute: 0.1 10*3/uL (ref 0.0–0.7)
Eosinophils Relative: 0 %
HEMATOCRIT: 37.5 % (ref 36.0–46.0)
HEMOGLOBIN: 11.2 g/dL — AB (ref 12.0–15.0)
LYMPHS ABS: 1.6 10*3/uL (ref 0.7–4.0)
Lymphocytes Relative: 9 %
MCH: 28.7 pg (ref 26.0–34.0)
MCHC: 29.9 g/dL — AB (ref 30.0–36.0)
MCV: 96.2 fL (ref 78.0–100.0)
MONOS PCT: 7 %
Monocytes Absolute: 1.2 10*3/uL — ABNORMAL HIGH (ref 0.1–1.0)
NEUTROS ABS: 14.6 10*3/uL — AB (ref 1.7–7.7)
NEUTROS PCT: 84 %
Platelets: 322 10*3/uL (ref 150–400)
RBC: 3.9 MIL/uL (ref 3.87–5.11)
RDW: 13.1 % (ref 11.5–15.5)
WBC: 17.5 10*3/uL — AB (ref 4.0–10.5)

## 2017-08-19 MED ORDER — SODIUM CHLORIDE 0.9 % IV BOLUS (SEPSIS)
1000.0000 mL | Freq: Once | INTRAVENOUS | Status: AC
  Administered 2017-08-19: 1000 mL via INTRAVENOUS

## 2017-08-19 MED ORDER — SILVER SULFADIAZINE 1 % EX CREA
TOPICAL_CREAM | Freq: Once | CUTANEOUS | Status: AC
  Administered 2017-08-19: 17:00:00 via TOPICAL
  Filled 2017-08-19: qty 50

## 2017-08-19 MED ORDER — IOPAMIDOL (ISOVUE-300) INJECTION 61%
80.0000 mL | Freq: Once | INTRAVENOUS | Status: AC | PRN
  Administered 2017-08-19: 80 mL via INTRAVENOUS

## 2017-08-19 MED ORDER — METRONIDAZOLE 500 MG PO TABS
500.0000 mg | ORAL_TABLET | Freq: Once | ORAL | Status: AC
  Administered 2017-08-19: 500 mg via ORAL
  Filled 2017-08-19: qty 1

## 2017-08-19 MED ORDER — METRONIDAZOLE 500 MG PO TABS: 500.0000 mg | ORAL_TABLET | Freq: Two times a day (BID) | ORAL | 0 refills | Status: DC

## 2017-08-19 NOTE — ED Provider Notes (Signed)
[facility] EMERGENCY DEPARTMENT Provider Note   CSN: [id number] Arrival date & time: 08/19/17  1331     History   Chief Complaint Chief Complaint  Patient presents with  . Diarrhea    HPI Andrea [name] is a 83 y.o. female.  Pt presents to the ED today for continued diarrhea for the past week.  Pt has been on antibiotics multiple times for a wound to her right lower leg.  The pt did go to pcp's office 2 days ago and a stool specimen was sent for c.diff.  Results have not yet come back.  The pt has had blood and mucous in her stool.  Pt is on Eliquis for a.fib.  Pt told her daughter that she wanted to die last night.  Pt said she has diarrhea about every hour.  The pt has not had any vomiting, but no appetite.   CHA2DS2/VAS Stroke Risk Points      5 >= 2 Points: High Risk  1 - 1.99 Points: Medium Risk  0 Points: Low Risk    The patient's score has not changed in the past year.:  No Change     Details    This score determines the patient's risk of having a stroke if the  patient has atrial fibrillation.       Points Metrics  1 Has Congestive Heart Failure:  Yes   0 Has Vascular Disease:  No   1 Has Hypertension:  Yes   2 Age:  69   0 Has Diabetes:  No   0 Had Stroke:  No  Had TIA:  No  Had thromboembolism:  No   1 Female:  Yes                Past Medical History:  Diagnosis Date  . Aortic insufficiency 09/04/2015  . Aortic regurgitation   . Bronchiectasis ([place])   . COPD (chronic obstructive pulmonary disease) ([place])   . DJD (degenerative joint disease)   . HLD (hyperlipidemia)   . HTN (hypertension)   . Mild mitral regurgitation by prior echocardiogram   . Mild mitral regurgitation by prior echocardiogram   . Mild tricuspid regurgitation   . Palpitations    a. has known history of PAC's and PVC's. b. 09/2015: monitor showed sinus rhythm with occasional PAC's and a brief episode of PAT.  [name] Thyroid disease     Patient Active Problem List   Diagnosis  Date Noted  . Traumatic open wound of right lower leg with infection   . Cellulitis of right lower extremity 06/27/2017  . Aortic atherosclerosis ([place]) 06/22/2017  . Anterior basement membrane dystrophy 04/27/2017  . Anterior corneal dystrophy 04/27/2017  . Protein-calorie malnutrition, severe ([place]) 04/08/2017  . AF (paroxysmal atrial fibrillation) ([place]) 04/03/2017  . Atrial tachycardia ([place]) 04/02/2017  . Chronic respiratory failure with hypoxia ([place]) 03/31/2017  . Chronic diastolic CHF (congestive heart failure) ([place]) 12/27/2016  . Pulmonary hypertension due to COPD ([place]) 10/04/2016  . Renal insufficiency 04/19/2016  . Skin cancer 09/25/2015  . Hypothyroidism 09/25/2015  . Osteoporosis 09/25/2015  . Vitamin D deficiency 01/23/2014  . Medication management 01/23/2014  . Hyperlipidemia 08/10/2013  . Bilateral dry eyes 09/08/2011  . Exposure keratopathy 09/08/2011  . DJD (degenerative joint disease)   . Essential hypertension 05/08/2010  . COPD with emphysema ([place]) 05/08/2010  . Lung nodules 05/08/2010    Past Surgical History:  Procedure Laterality Date  . ABDOMINAL HYSTERECTOMY  1993  . CATARACT EXTRACTION, BILATERAL    .  WOUND DEBRIDEMENT Right 06/29/2017   Procedure: RIGHT LOWER  LEG DEBRIDEMENT;  Surgeon: [name], MD;  Location: [facility];  Service: General;  Laterality: Right;    OB History    No data available       Home Medications    Prior to Admission medications   Medication Sig Start Date End Date Taking? Authorizing Provider  ALPRAZolam (XANAX) 0.25 MG tablet Take 0.25 mg by mouth at bedtime.    Yes [provider]  apixaban (ELIQUIS) 2.5 MG TABS tablet Take 1 tablet (2.5 mg total) by mouth 2 (two) times daily. 05/26/17  Yes Lelon Perla, MD  diltiazem (CARDIZEM) 90 MG tablet Take 90 mg by mouth 4 (four) times daily. 05/31/17  Yes [provider]  feeding supplement, ENSURE ENLIVE, (ENSURE ENLIVE) LIQD Take 237 mLs by mouth 2  (two) times daily between meals. 04/12/17  Yes Rama, Venetia Maxon, MD  furosemide (LASIX) 20 MG tablet Take 20 mg by mouth daily as needed for fluid.    Yes [provider]  levothyroxine (SYNTHROID, LEVOTHROID) 50 MCG tablet TAKE ONE TABLET BY MOUTH ONCE A DAY AS DIRECTED 08/09/17  Yes Unk Pinto, MD  Metoprolol Tartrate 37.5 MG TABS Take 37.5 mg by mouth 2 (two) times daily. 05/31/17  Yes Lelon Perla, MD  silver sulfADIAZINE (SILVADENE) 1 % cream Apply to affected area daily 07/14/17 07/14/18 Yes [name], MD  traMADol (ULTRAM) 50 MG tablet Take 1 tablet (50 mg total) by mouth every 6 (six) hours as needed. 07/07/17  Yes [name], MD  Ascorbic Acid (VITAMIN C) 500 MG tablet Take 500 mg by mouth daily.      [provider]  cholecalciferol (VITAMIN D) 1000 UNITS tablet Take 1,000 Units by mouth daily.     [provider]  clindamycin (CLEOCIN) 300 MG capsule Take 1 capsule (300 mg total) by mouth 3 (three) times daily. Patient not taking: Reported on 08/19/2017 06/30/17   Isaac Bliss, Rayford Halsted, MD  metroNIDAZOLE (FLAGYL) 500 MG tablet Take 1 tablet (500 mg total) by mouth 2 (two) times daily. 08/19/17   Isla Pence, MD  nystatin (MYCOSTATIN) 100000 UNIT/ML suspension 5 ml four times a day, retain in mouth as long as possible (Swish and Spit).  Use for 48 hours after symptoms resolve. Patient not taking: Reported on 08/19/2017 08/16/17   Vicie Mutters, PA-C    Family History Family History  Problem Relation Age of Onset  . Emphysema Mother   . Diabetes Mother   . Cancer Father   . Emphysema Sister   . Emphysema Sister   . Rheumatic fever Sister   . Heart attack Sister   . Diabetes Sister   . Diabetes Brother   . Asthma Brother        as a child    Social History Social History   Tobacco Use  . Smoking status: Former Smoker    Packs/day: 1.00    Years: 20.00    Pack years: 20.00    Types: Cigarettes    Last  attempt to quit: 09/27/1974    Years since quitting: 42.9  . Smokeless tobacco: Never Used  Substance Use Topics  . Alcohol use: No  . Drug use: No     Allergies   Vancomycin and Sulfa antibiotics   Review of Systems Review of Systems  Gastrointestinal: Positive for abdominal pain, blood in stool and diarrhea.  All other systems reviewed and are negative.  Physical Exam Updated Vital Signs BP (!) 158/66   Pulse 87   Temp 97.6 F (36.4 C) (Oral)   Resp (!) 22   SpO2 95%   Physical Exam  Constitutional: She is oriented to person, place, and time.  HENT:  Head: Normocephalic and atraumatic.  Right Ear: External ear normal.  Left Ear: External ear normal.  Nose: Nose normal.  Mouth/Throat: Mucous membranes are dry.  Eyes: Conjunctivae and EOM are normal. Pupils are equal, round, and reactive to light.  Neck: Normal range of motion. Neck supple.  Cardiovascular: Normal rate, regular rhythm, normal heart sounds and intact distal pulses.  Pulmonary/Chest: Effort normal and breath sounds normal.  Abdominal: Soft. Bowel sounds are normal. There is tenderness in the left lower quadrant.  Musculoskeletal: Normal range of motion.  Neurological: She is alert and oriented to person, place, and time.  Skin: Skin is warm and dry. Capillary refill takes less than 2 seconds.  Healing wound to RLE  Psychiatric: She has a normal mood and affect. Her behavior is normal. Judgment and thought content normal.  Nursing note and vitals reviewed.    ED Treatments / Results  Labs (all labs ordered are listed, but only abnormal results are displayed) Labs Reviewed  CBC WITH DIFFERENTIAL/PLATELET - Abnormal; Notable for the following components:      Result Value   WBC 17.5 (*)    Hemoglobin 11.2 (*)    MCHC 29.9 (*)    Neutro Abs 14.6 (*)    Monocytes Absolute 1.2 (*)    All other components within normal limits  BASIC METABOLIC PANEL - Abnormal; Notable for the following  components:   Potassium 3.2 (*)    Chloride 97 (*)    Glucose, Bld 103 (*)    GFR calc non Af Amer 56 (*)    All other components within normal limits  URINALYSIS, ROUTINE W REFLEX MICROSCOPIC - Abnormal; Notable for the following components:   Color, Urine STRAW (*)    Glucose, UA 50 (*)    Protein, ur 30 (*)    Leukocytes, UA MODERATE (*)    Bacteria, UA RARE (*)    Squamous Epithelial / LPF 0-5 (*)    All other components within normal limits  GASTROINTESTINAL PANEL BY PCR, STOOL (REPLACES STOOL CULTURE)  C DIFFICILE QUICK SCREEN W PCR REFLEX    EKG  EKG Interpretation  Date/Time:  Friday August 19 2017 17:30:28 EST Ventricular Rate:  80 PR Interval:    QRS Duration: 91 QT Interval:  396 QTC Calculation: 457 R Axis:   66 Text Interpretation:  Sinus rhythm Left ventricular hypertrophy Nonspecific T abnormalities, lateral leads Baseline wander in lead(s) V4 No significant change since last tracing Confirmed by Isla Pence [id number]) on 08/19/2017 5:50:18 PM       Radiology Ct Abdomen Pelvis W Contrast  Result Date: 08/19/2017 CLINICAL DATA:  Diarrhea for 1 week. EXAM: CT ABDOMEN AND PELVIS WITH CONTRAST TECHNIQUE: Multidetector CT imaging of the abdomen and pelvis was performed using the standard protocol following bolus administration of intravenous contrast. CONTRAST:  80 ml ISOVUE-300 IOPAMIDOL (ISOVUE-300) INJECTION 61% COMPARISON:  CT abdomen and pelvis 04/08/2017. FINDINGS: Lower chest: Small to moderate bilateral pleural effusions are present, larger on the left, and increased since the prior CT. Lung bases demonstrate emphysematous disease. Heart size is enlarged. Calcific coronary artery disease is noted. Compressive atelectasis in lung bases is present Hepatobiliary: No focal liver abnormality is seen. 1 cm hypervascular lesion in the anterior  left hepatic lobe is better visualized on the prior CT. No gallstones, gallbladder wall thickening, or biliary dilatation.  Pancreas: Unremarkable. No pancreatic ductal dilatation or surrounding inflammatory changes. Spleen: Normal in size without focal abnormality. Adrenals/Urinary Tract: Multiple low attenuating lesions in the right kidney are likely cysts and unchanged. No hydronephrosis. The right kidney is ptotic, unchanged. Urinary bladder appears normal. Adrenal glands are unremarkable. Stomach/Bowel: Extensive diverticulosis without diverticulitis is worst in the sigmoid. The colon otherwise appears normal. Stomach and small bowel appear normal. No evidence of appendicitis. Vascular/Lymphatic: Aortic atherosclerosis. No enlarged abdominal or pelvic lymph nodes. Reproductive: Status post hysterectomy. No adnexal masses. Other: No ascites. Musculoskeletal: Severe convex right scoliosis noted. No lytic or sclerotic lesion. No acute abnormality. IMPRESSION: No acute abnormality abdomen or pelvis. Small to moderate bilateral pleural effusions, larger on the left, increased since the prior CT. Diverticulosis without diverticulitis. Calcific aortic and coronary atherosclerosis. Electronically Signed   By: Inge Rise M.D.   On: 08/19/2017 18:46    Procedures Procedures (including critical care time)  Medications Ordered in ED Medications  metroNIDAZOLE (FLAGYL) tablet 500 mg (not administered)  sodium chloride 0.9 % bolus 1,000 mL (0 mLs Intravenous Stopped 08/19/17 1905)  silver sulfADIAZINE (SILVADENE) 1 % cream ( Topical Given 08/19/17 1729)  iopamidol (ISOVUE-300) 61 % injection 80 mL (80 mLs Intravenous Contrast Given 08/19/17 1809)  sodium chloride 0.9 % bolus 1,000 mL (1,000 mLs Intravenous New Bag/Given 08/19/17 1906)     Initial Impression / Assessment and Plan / ED Course  I have reviewed the triage vital signs and the nursing notes.  Pertinent labs & imaging results that were available during my care of the patient were reviewed by me and considered in my medical decision making (see chart for  details).    Pt is feeling much better.  She was unable to produce a stool sample for testing while here.  I will start her on flagyl for possible c.diff as she has been on multiple abx.  The pt knows to take otc probiotic pills and to return if worse.  Final Clinical Impressions(s) / ED Diagnoses   Final diagnoses:  Diarrhea, unspecified type  Dehydration    ED Discharge Orders        Ordered    metroNIDAZOLE (FLAGYL) 500 MG tablet  2 times daily     08/19/17 2001       Isla Pence, MD 08/19/17 2003

## 2017-08-19 NOTE — ED Notes (Signed)
Pt states she does not need to urinate at this time, aware of DO

## 2017-08-19 NOTE — ED Notes (Signed)
Pt states very little diarrhea, still unable to collect sample for lab. Pt says she is ready to go home.

## 2017-08-19 NOTE — ED Notes (Signed)
Pt alert & oriented x4. Patient given discharge instructions, paperwork & prescription(s). Patient verbalized understanding. Pt left department in wheelchair escorted by staff. Pt left w/ no further questions.

## 2017-08-19 NOTE — ED Triage Notes (Addendum)
Pt c/o diarrhea x 1 week. Seen pcp Tuesday, [place].  They did blood work. Stated she needed a stool sample due to c diff possibility. Bloody mucus in stool. Daughter stated pt stated she wanted to die last night. ble swelling noted with wound to right leg. Alert/oriented to most. States one episode of mucus stool every hour.

## 2017-08-19 NOTE — Discharge Instructions (Signed)
OTC probiotic pills

## 2017-08-24 ENCOUNTER — Other Ambulatory Visit: Payer: Self-pay | Admitting: Internal Medicine

## 2017-08-24 ENCOUNTER — Telehealth: Payer: Self-pay

## 2017-08-24 NOTE — Telephone Encounter (Signed)
Per pt's dgtr her mother's diarrhea has gotten better but she still is weak & still having to use oxygen. Please advise.  Per provider mother can add probiotic, drink water & gatorade.   Pt's dgtr voiced understanding & hung up.

## 2017-08-25 LAB — C.DIFF TOXINB QL PCR: CLOSTRIDIUM DIFFICILE TOXINB,QL REAL TIME PCR: DETECTED — CR

## 2017-08-25 LAB — CLOSTRIDIUM DIFFICILE CULTURE-FECAL

## 2017-08-26 ENCOUNTER — Emergency Department (HOSPITAL_COMMUNITY): Payer: Medicare Other

## 2017-08-26 ENCOUNTER — Encounter (HOSPITAL_COMMUNITY): Payer: Self-pay | Admitting: Emergency Medicine

## 2017-08-26 ENCOUNTER — Inpatient Hospital Stay (HOSPITAL_COMMUNITY)
Admission: EM | Admit: 2017-08-26 | Discharge: 2017-09-01 | DRG: 291 | Disposition: A | Discharge status: Home Health | Payer: Medicare Other | Attending: Internal Medicine | Admitting: Internal Medicine

## 2017-08-26 ENCOUNTER — Telehealth: Payer: Self-pay

## 2017-08-26 ENCOUNTER — Other Ambulatory Visit: Payer: Self-pay

## 2017-08-26 ENCOUNTER — Inpatient Hospital Stay (HOSPITAL_COMMUNITY): Payer: Medicare Other

## 2017-08-26 DIAGNOSIS — I351 Nonrheumatic aortic (valve) insufficiency: Secondary | ICD-10-CM

## 2017-08-26 DIAGNOSIS — Z7901 Long term (current) use of anticoagulants: Secondary | ICD-10-CM | POA: Diagnosis not present

## 2017-08-26 DIAGNOSIS — L03115 Cellulitis of right lower limb: Secondary | ICD-10-CM | POA: Diagnosis not present

## 2017-08-26 DIAGNOSIS — I10 Essential (primary) hypertension: Secondary | ICD-10-CM | POA: Diagnosis present

## 2017-08-26 DIAGNOSIS — Z79899 Other long term (current) drug therapy: Secondary | ICD-10-CM

## 2017-08-26 DIAGNOSIS — E039 Hypothyroidism, unspecified: Secondary | ICD-10-CM | POA: Diagnosis present

## 2017-08-26 DIAGNOSIS — J438 Other emphysema: Secondary | ICD-10-CM | POA: Diagnosis not present

## 2017-08-26 DIAGNOSIS — E782 Mixed hyperlipidemia: Secondary | ICD-10-CM

## 2017-08-26 DIAGNOSIS — I480 Paroxysmal atrial fibrillation: Secondary | ICD-10-CM | POA: Diagnosis not present

## 2017-08-26 DIAGNOSIS — Z79890 Hormone replacement therapy (postmenopausal): Secondary | ICD-10-CM

## 2017-08-26 DIAGNOSIS — J439 Emphysema, unspecified: Secondary | ICD-10-CM | POA: Diagnosis present

## 2017-08-26 DIAGNOSIS — D638 Anemia in other chronic diseases classified elsewhere: Secondary | ICD-10-CM | POA: Diagnosis present

## 2017-08-26 DIAGNOSIS — E43 Unspecified severe protein-calorie malnutrition: Secondary | ICD-10-CM | POA: Diagnosis present

## 2017-08-26 DIAGNOSIS — Z888 Allergy status to other drugs, medicaments and biological substances status: Secondary | ICD-10-CM

## 2017-08-26 DIAGNOSIS — J9621 Acute and chronic respiratory failure with hypoxia: Secondary | ICD-10-CM | POA: Diagnosis present

## 2017-08-26 DIAGNOSIS — E038 Other specified hypothyroidism: Secondary | ICD-10-CM | POA: Diagnosis not present

## 2017-08-26 DIAGNOSIS — E785 Hyperlipidemia, unspecified: Secondary | ICD-10-CM | POA: Diagnosis present

## 2017-08-26 DIAGNOSIS — R0602 Shortness of breath: Secondary | ICD-10-CM | POA: Diagnosis not present

## 2017-08-26 DIAGNOSIS — I110 Hypertensive heart disease with heart failure: Principal | ICD-10-CM | POA: Diagnosis present

## 2017-08-26 DIAGNOSIS — I4891 Unspecified atrial fibrillation: Secondary | ICD-10-CM | POA: Diagnosis not present

## 2017-08-26 DIAGNOSIS — Z9889 Other specified postprocedural states: Secondary | ICD-10-CM

## 2017-08-26 DIAGNOSIS — J449 Chronic obstructive pulmonary disease, unspecified: Secondary | ICD-10-CM | POA: Diagnosis present

## 2017-08-26 DIAGNOSIS — J90 Pleural effusion, not elsewhere classified: Secondary | ICD-10-CM | POA: Diagnosis present

## 2017-08-26 DIAGNOSIS — I5032 Chronic diastolic (congestive) heart failure: Secondary | ICD-10-CM

## 2017-08-26 DIAGNOSIS — R846 Abnormal cytological findings in specimens from respiratory organs and thorax: Secondary | ICD-10-CM | POA: Diagnosis not present

## 2017-08-26 DIAGNOSIS — Z681 Body mass index (BMI) 19 or less, adult: Secondary | ICD-10-CM | POA: Diagnosis not present

## 2017-08-26 DIAGNOSIS — I5033 Acute on chronic diastolic (congestive) heart failure: Secondary | ICD-10-CM | POA: Diagnosis not present

## 2017-08-26 DIAGNOSIS — Z87891 Personal history of nicotine dependence: Secondary | ICD-10-CM

## 2017-08-26 DIAGNOSIS — R0902 Hypoxemia: Secondary | ICD-10-CM

## 2017-08-26 DIAGNOSIS — Z882 Allergy status to sulfonamides status: Secondary | ICD-10-CM

## 2017-08-26 LAB — COMPREHENSIVE METABOLIC PANEL
ALK PHOS: 78 U/L (ref 38–126)
ALT: 9 U/L — ABNORMAL LOW (ref 14–54)
ANION GAP: 8 (ref 5–15)
AST: 18 U/L (ref 15–41)
Albumin: 3 g/dL — ABNORMAL LOW (ref 3.5–5.0)
BUN: 20 mg/dL (ref 6–20)
CALCIUM: 8.8 mg/dL — AB (ref 8.9–10.3)
CO2: 31 mmol/L (ref 22–32)
Chloride: 95 mmol/L — ABNORMAL LOW (ref 101–111)
Creatinine, Ser: 1.04 mg/dL — ABNORMAL HIGH (ref 0.44–1.00)
GFR calc non Af Amer: 47 mL/min — ABNORMAL LOW (ref >60)
GFR, EST AFRICAN AMERICAN: 55 mL/min — AB (ref >60)
GLUCOSE: 163 mg/dL — AB (ref 65–99)
POTASSIUM: 4 mmol/L (ref 3.5–5.1)
SODIUM: 134 mmol/L — AB (ref 135–145)
Total Bilirubin: 0.5 mg/dL (ref 0.3–1.2)
Total Protein: 7.6 g/dL (ref 6.5–8.1)

## 2017-08-26 LAB — CBC WITH DIFFERENTIAL/PLATELET
BASOS ABS: 0 10*3/uL (ref 0.0–0.1)
BASOS PCT: 0 %
EOS ABS: 0.1 10*3/uL (ref 0.0–0.7)
Eosinophils Relative: 1 %
HEMATOCRIT: 39.3 % (ref 36.0–46.0)
Hemoglobin: 11.7 g/dL — ABNORMAL LOW (ref 12.0–15.0)
Lymphocytes Relative: 13 %
Lymphs Abs: 1.4 10*3/uL (ref 0.7–4.0)
MCH: 28.2 pg (ref 26.0–34.0)
MCHC: 29.8 g/dL — ABNORMAL LOW (ref 30.0–36.0)
MCV: 94.7 fL (ref 78.0–100.0)
MONO ABS: 0.9 10*3/uL (ref 0.1–1.0)
Monocytes Relative: 8 %
NEUTROS ABS: 8.8 10*3/uL — AB (ref 1.7–7.7)
NEUTROS PCT: 78 %
Platelets: 384 10*3/uL (ref 150–400)
RBC: 4.15 MIL/uL (ref 3.87–5.11)
RDW: 13.3 % (ref 11.5–15.5)
WBC: 11.2 10*3/uL — ABNORMAL HIGH (ref 4.0–10.5)

## 2017-08-26 LAB — ECHOCARDIOGRAM COMPLETE
HEIGHTINCHES: 63 in
WEIGHTICAEL: 1696 [oz_av]

## 2017-08-26 LAB — TROPONIN I

## 2017-08-26 LAB — BRAIN NATRIURETIC PEPTIDE: B NATRIURETIC PEPTIDE 5: 335 pg/mL — AB (ref 0.0–100.0)

## 2017-08-26 MED ORDER — ONDANSETRON HCL 4 MG PO TABS: 4.0000 mg | ORAL_TABLET | Freq: Four times a day (QID) | ORAL | Status: DC | PRN

## 2017-08-26 MED ORDER — ASPIRIN 81 MG PO CHEW
324.0000 mg | CHEWABLE_TABLET | Freq: Once | ORAL | Status: AC
  Administered 2017-08-26: 324 mg via ORAL
  Filled 2017-08-26: qty 4

## 2017-08-26 MED ORDER — ACETAMINOPHEN 325 MG PO TABS: 650.0000 mg | ORAL_TABLET | Freq: Four times a day (QID) | ORAL | Status: DC | PRN

## 2017-08-26 MED ORDER — APIXABAN 2.5 MG PO TABS
2.5000 mg | ORAL_TABLET | Freq: Two times a day (BID) | ORAL | Status: DC
  Administered 2017-08-26 – 2017-08-28 (×4): 2.5 mg via ORAL
  Filled 2017-08-26 (×11): qty 1

## 2017-08-26 MED ORDER — POLYETHYLENE GLYCOL 3350 17 G PO PACK: 17.0000 g | PACK | Freq: Every day | ORAL | Status: DC | PRN

## 2017-08-26 MED ORDER — FUROSEMIDE 10 MG/ML IJ SOLN
40.0000 mg | Freq: Once | INTRAMUSCULAR | Status: AC
  Administered 2017-08-26: 40 mg via INTRAVENOUS
  Filled 2017-08-26: qty 4

## 2017-08-26 MED ORDER — ONDANSETRON HCL 4 MG/2ML IJ SOLN: 4.0000 mg | Freq: Four times a day (QID) | INTRAMUSCULAR | Status: DC | PRN

## 2017-08-26 MED ORDER — ALBUTEROL SULFATE (2.5 MG/3ML) 0.083% IN NEBU
5.0000 mg | INHALATION_SOLUTION | Freq: Once | RESPIRATORY_TRACT | Status: AC
  Administered 2017-08-26: 5 mg via RESPIRATORY_TRACT
  Filled 2017-08-26: qty 6

## 2017-08-26 MED ORDER — ACETAMINOPHEN 650 MG RE SUPP: 650.0000 mg | Freq: Four times a day (QID) | RECTAL | Status: DC | PRN

## 2017-08-26 MED ORDER — FUROSEMIDE 40 MG PO TABS
40.0000 mg | ORAL_TABLET | Freq: Every day | ORAL | Status: DC
  Administered 2017-08-27 – 2017-08-29 (×3): 40 mg via ORAL
  Filled 2017-08-26 (×3): qty 1

## 2017-08-26 NOTE — ED Notes (Signed)
Pt just finished ECHO.

## 2017-08-26 NOTE — ED Triage Notes (Addendum)
Patient c/o shortness of breath and generalized fatigue. Denies any chest pain. Patient states started to become "very short of breath 2-3 days ago" and is progressively getting worse. Per patient wears 2 liters of oxygen via [place] at night only. Patient states only occasional, nonproductive cough. Denies any fevers. Patient has +3 pitting edema in lower extremities. Denies hx of CHF but has hx of COPD and cardiac hx.

## 2017-08-26 NOTE — ED Notes (Signed)
Pt states she took her first eliquis at 1100 today and usually doesn't take the next one till 1 am. Pharmacy aware. Nad. Resting.

## 2017-08-26 NOTE — ED Notes (Signed)
ekg just done. Pt was shaking earlier

## 2017-08-26 NOTE — ED Notes (Signed)
RT aware of tx

## 2017-08-26 NOTE — H&P (Signed)
History and Physical    Andrea [name] ZOX:[id number] DOB: 04-20-1929 DOA: 08/26/2017  PCP: Unk Pinto, MD  Patient coming from: Home  Chief Complaint: Shortness of breath  HPI: Andrea [name] is a 83 y.o. female with medical history significant of aortic insufficiency, aortic regurg, COPD, DJD, HLD brought in by her daughters for concern of shortness of breath and weakness. Patient reports that 3-4 days ago she began having symptoms but her daughter says that her symptoms actually began a week ago.  Patient was taken off lasix about 2-3 weeks ago.  Per her daughter this was due to decreased kidney function.  Since that time patient's legs have increased in swelling. Patient denies fevers, chills.  No palpitations, no diarrhea but patient daughter reports she is being treated for C. Diff and that this is her last day of treatment.   ED Course: Patient was seen by emergency room physician and found to have large bilateral pleural effusions and questionable volume overload.  She was given a 40 mg dose of IV Lasix and TRH was asked for admission for further evaluation and treatment of her bilateral pleural effusions causing acute hypoxic respiratory failure.  Review of Systems: As per HPI otherwise 10 point review of systems negative.    Past Medical History:  Diagnosis Date  . Aortic insufficiency 09/04/2015  . Aortic regurgitation   . Bronchiectasis ([place])   . COPD (chronic obstructive pulmonary disease) ([place])   . DJD (degenerative joint disease)   . HLD (hyperlipidemia)   . HTN (hypertension)   . Mild mitral regurgitation by prior echocardiogram   . Mild mitral regurgitation by prior echocardiogram   . Mild tricuspid regurgitation   . Palpitations    a. has known history of PAC's and PVC's. b. 09/2015: monitor showed sinus rhythm with occasional PAC's and a brief episode of PAT.  [name] Thyroid disease     Past Surgical History:  Procedure Laterality Date  . ABDOMINAL HYSTERECTOMY   1993  . CATARACT EXTRACTION, BILATERAL    . WOUND DEBRIDEMENT Right 06/29/2017   Procedure: RIGHT LOWER  LEG DEBRIDEMENT;  Surgeon: [name], MD;  Location: [facility];  Service: General;  Laterality: Right;     reports that she quit smoking about 42 years ago. Her smoking use included cigarettes. She has a 20.00 pack-year smoking history. she has never used smokeless tobacco. She reports that she does not drink alcohol or use drugs.  Allergies  Allergen Reactions  . Vancomycin     Worsening kidney function  . Sulfa Antibiotics Palpitations    Unknown    Family History  Problem Relation Age of Onset  . Emphysema Mother   . Diabetes Mother   . Cancer Father   . Emphysema Sister   . Emphysema Sister   . Rheumatic fever Sister   . Heart attack Sister   . Diabetes Sister   . Diabetes Brother   . Asthma Brother        as a child     Prior to Admission medications   Medication Sig Start Date End Date Taking? Authorizing Provider  ALPRAZolam (XANAX) 0.25 MG tablet Take 0.125-0.25 mg by mouth at bedtime.    Yes [provider]  apixaban (ELIQUIS) 2.5 MG TABS tablet Take 1 tablet (2.5 mg total) by mouth 2 (two) times daily. 05/26/17  Yes Lelon Perla, MD  Ascorbic Acid (VITAMIN C) 500 MG tablet Take 500 mg by mouth daily.     Yes  [provider]  cholecalciferol (VITAMIN D) 1000 UNITS tablet Take 1,000 Units by mouth daily.    Yes [provider]  diltiazem (CARDIZEM) 90 MG tablet Take 90 mg by mouth 4 (four) times daily. 05/31/17  Yes [provider]  feeding supplement, ENSURE ENLIVE, (ENSURE ENLIVE) LIQD Take 237 mLs by mouth 2 (two) times daily between meals. 04/12/17  Yes Rama, Venetia Maxon, MD  furosemide (LASIX) 20 MG tablet Take 20 mg by mouth daily as needed for fluid.    Yes [provider]  levothyroxine (SYNTHROID, LEVOTHROID) 50 MCG tablet TAKE ONE TABLET BY MOUTH ONCE A DAY AS DIRECTED 08/09/17  Yes Unk Pinto, MD    Metoprolol Tartrate 37.5 MG TABS Take 37.5 mg by mouth 2 (two) times daily. 05/31/17  Yes Lelon Perla, MD  metroNIDAZOLE (FLAGYL) 500 MG tablet Take 1 tablet (500 mg total) by mouth 2 (two) times daily. 08/19/17  Yes Isla Pence, MD  nystatin (MYCOSTATIN) 100000 UNIT/ML suspension 5 ml four times a day, retain in mouth as long as possible (Swish and Spit).  Use for 48 hours after symptoms resolve. 08/16/17  Yes Vicie Mutters, PA-C  Probiotic Product (PROBIOTIC-10) CHEW Chew 2 tablets by mouth daily.   Yes [provider]  silver sulfADIAZINE (SILVADENE) 1 % cream Apply to affected area daily 07/14/17 07/14/18 Yes [name], MD  traMADol (ULTRAM) 50 MG tablet Take 1 tablet (50 mg total) by mouth every 6 (six) hours as needed. 07/07/17  Yes [name], MD  clindamycin (CLEOCIN) 300 MG capsule Take 1 capsule (300 mg total) by mouth 3 (three) times daily. Patient not taking: Reported on 08/19/2017 06/30/17   Isaac Bliss, Rayford Halsted, MD    Physical Exam: Vitals:   08/26/17 1300 08/26/17 1330 08/26/17 1400 08/26/17 1430  BP: 138/64 (!) 154/65 133/62 (!) 146/61  Pulse: 70 67 74 68  Resp: 20 (!) 28 (!) 29 (!) 25  Temp:      TempSrc:      SpO2: 99% 93% 91% 91%  Weight:      Height:          Constitutional: Elderly cachectic female Vitals:   08/26/17 1300 08/26/17 1330 08/26/17 1400 08/26/17 1430  BP: 138/64 (!) 154/65 133/62 (!) 146/61  Pulse: 70 67 74 68  Resp: 20 (!) 28 (!) 29 (!) 25  Temp:      TempSrc:      SpO2: 99% 93% 91% 91%  Weight:      Height:       Eyes: PERRL, lids and conjunctivae normal ENMT: Mucous membranes are moist. Posterior pharynx clear of any exudate or lesions.Normal dentition.  Neck: normal, supple, no masses, no thyromegaly Respiratory: Dullness to percussion in lower lung fields bilaterally, rales appreciated in the mid lung zones bilaterally Cardiovascular: Regular rate and rhythm, no murmurs / rubs / gallops.  2+  pitting edema to mid thighs bilaterally. 2+ pedal pulses. No carotid bruits.  Abdomen: no tenderness, no masses palpated. No hepatosplenomegaly. Bowel sounds positive.  Musculoskeletal: no clubbing / cyanosis. No joint deformity upper and lower extremities. Good ROM, no contractures. Normal muscle tone.  Skin: Significantly dry skin of lower extremities bilaterally with a wound that is covered with a bandage on the right lower extremity Neurologic: Hard of hearing. sensation intact, DTR normal. Strength 5/5 in all 4.  Psychiatric:  Normal mood.     Labs on Admission: I have personally reviewed following labs and imaging studies  CBC: Recent Labs  Lab 08/26/17 1210  WBC 11.2*  NEUTROABS 8.8*  HGB 11.7*  HCT 39.3  MCV 94.7  PLT 998   Basic Metabolic Panel: Recent Labs  Lab 08/26/17 1210  NA 134*  K 4.0  CL 95*  CO2 31  GLUCOSE 163*  BUN 20  CREATININE 1.04*  CALCIUM 8.8*   GFR: Estimated Creatinine Clearance: 29.5 mL/min (A) (by C-G formula based on SCr of 1.04 mg/dL (H)). Liver Function Tests: Recent Labs  Lab 08/26/17 1210  AST 18  ALT 9*  ALKPHOS 78  BILITOT 0.5  PROT 7.6  ALBUMIN 3.0*   No results for input(s): LIPASE, AMYLASE in the last 168 hours. No results for input(s): AMMONIA in the last 168 hours. Coagulation Profile: No results for input(s): INR, PROTIME in the last 168 hours. Cardiac Enzymes: Recent Labs  Lab 08/26/17 1210  TROPONINI <0.03   BNP (last 3 results) No results for input(s): PROBNP in the last 8760 hours. HbA1C: No results for input(s): HGBA1C in the last 72 hours. CBG: No results for input(s): GLUCAP in the last 168 hours. Lipid Profile: No results for input(s): CHOL, HDL, LDLCALC, TRIG, CHOLHDL, LDLDIRECT in the last 72 hours. Thyroid Function Tests: No results for input(s): TSH, T4TOTAL, FREET4, T3FREE, THYROIDAB in the last 72 hours. Anemia Panel: No results for input(s): VITAMINB12, FOLATE, FERRITIN, TIBC, IRON,  RETICCTPCT in the last 72 hours. Urine analysis:    Component Value Date/Time   COLORURINE STRAW (A) 08/19/2017 1855   APPEARANCEUR CLEAR 08/19/2017 1855   LABSPEC 1.015 08/19/2017 1855   PHURINE 6.0 08/19/2017 1855   GLUCOSEU 50 (A) 08/19/2017 1855   HGBUR NEGATIVE 08/19/2017 1855   BILIRUBINUR NEGATIVE 08/19/2017 1855   KETONESUR NEGATIVE 08/19/2017 1855   PROTEINUR 30 (A) 08/19/2017 1855   UROBILINOGEN 0.2 08/20/2014 1613   NITRITE NEGATIVE 08/19/2017 1855   LEUKOCYTESUR MODERATE (A) 08/19/2017 1855   Sepsis Labs: !!!!!!!!!!!!!!!!!!!!!!!!!!!!!!!!!!!!!!!!!!!! _0 (procalcitonin:4,lacticidven:4) )No results found for this or any previous visit (from the past 240 hour(s)).   Radiological Exams on Admission: Dg Chest 2 View  Result Date: 08/26/2017 CLINICAL DATA:  Patient having increased SOB over the past few days. Patient is on oxygen at home and states that she usually only wears it at night but has been wearing it all day since having SOB. Hx of pna, htn-controlled with medication, COPD. Ex-smoker. EXAM: CHEST  2 VIEW COMPARISON:  06/28/2017 FINDINGS: The lungs are hyperinflated likely secondary to COPD. There are moderate bilateral pleural effusions with bibasilar atelectasis. There is bilateral diffuse interstitial thickening. There is no pneumothorax. Stable cardiomediastinal silhouette. There is no acute osseous abnormality. There is severe osteoarthritis of the right glenohumeral joint. There is mild osteoarthritis of the left glenohumeral joint. IMPRESSION: 1. Findings consistent with CHF. Electronically Signed   By: Kathreen Devoid   On: 08/26/2017 13:36    EKG: Independently reviewed. Sinus rhythm, Multiple premature complexes, vent & supraven. Probable left ventricular hypertrophy  Assessment/Plan Principal Problem:   Pleural effusion Active Problems:   Essential hypertension   COPD with emphysema (HCC)   Hyperlipidemia   Hypothyroidism   Chronic diastolic CHF  (congestive heart failure) (HCC)   AF (paroxysmal atrial fibrillation) (HCC)   Cellulitis of right lower extremity     Bilateral pleural effusions - Will attempt to diurese patient to decrease size of pleural effusions -Discussed with patient and her daughters that if this does not work patient may require thoracentesis -Patient respiratory status is stable on 2 L  of oxygen right now - We will draw daily BMPs -Lasix 20 mg p.o. Daily -We will follow serial chest x-rays   Chronic diastolic congestive heart failure - Repeat echocardiogram -Diuresis with Lasix -Daily weights -Strict I's and O's -1500 mL fluid restriction - Daily BMP  Essential hypertension -Continue Cardizem and metoprolol  Questionable diagnosis of C. difficile -We will send C. difficile PCR -Patient apparently had been treated with Flagyl but it is unclear as to why given the fact that she had never had a positive C. difficile culture  Hypothyroidism -We will continue levothyroxine  Paroxysmal atrial fibrillation -We will continue metoprolol -We will continue Eliquis  Cellulitis of the right lower extremity -Wound care consult - Courtesy inform Dr. Constance Haw of patient's admission to the hospital  Malnutrition - Ensure 3 times daily   DVT prophylaxis: eliquis Code Status: Full Code  Family Communication: Two daughters bedside Disposition Plan: will be discharge back home when respiratory status improves  Consults called: None Admission status: Inpatient, telemetry   Loretha Stapler MD [facility] Pager [phone]  If 7PM-7AM, please contact night-coverage [email or web address] Password [email or web address]  08/26/2017, 4:04 PM

## 2017-08-26 NOTE — ED Notes (Signed)
Pharmacy aware of eliquis

## 2017-08-26 NOTE — ED Notes (Signed)
Pt takne to xray

## 2017-08-26 NOTE — Telephone Encounter (Signed)
Followed up with Andrea [name] to see how she is feeling. States that she no longer has diarrhea but doesn't feel well at al and is extremely weak and having trouble sleeping. Spoke with Dr. Melford Aase who advises to go to the ER. Patient verbalized understanding.

## 2017-08-26 NOTE — ED Provider Notes (Signed)
[facility] Provider Note   CSN: [id number] Arrival date & time: 08/26/17  1200     History   Chief Complaint Chief Complaint  Patient presents with  . Shortness of Breath    HPI Andrea [name] is a 83 y.o. female.  HPI  83 year old female presents with shortness of breath.  The patient always has some degree of shortness of breath but over the last 1 week it has been progressively worsening.  Started after being seen here on 11/23 for diarrhea.  At that time she was put on Flagyl for concern for C. difficile although she was unable to produce a sample at that time and the PCP lost the sample given to them.  The diarrhea has stopped.  She now has some on and off constipation.  Abdominal pain/cramping has resolved.  However she has had worsening shortness of breath with minimal weak cough.  No fevers, chest pain, chest tightness or pressure.  She has chronic bilateral lower extremity edema but it seems to be worse today.  She also has a poorly healing wound on her right leg that has been infected and has been on antibiotics for this.  She states recently her PCP increased her Lasix due to the leg swelling but due to worsening kidney function her Lasix was completely stopped about a week ago.  Past Medical History:  Diagnosis Date  . Aortic insufficiency 09/04/2015  . Aortic regurgitation   . Bronchiectasis ([place])   . COPD (chronic obstructive pulmonary disease) ([place])   . DJD (degenerative joint disease)   . HLD (hyperlipidemia)   . HTN (hypertension)   . Mild mitral regurgitation by prior echocardiogram   . Mild mitral regurgitation by prior echocardiogram   . Mild tricuspid regurgitation   . Palpitations    a. has known history of PAC's and PVC's. b. 09/2015: monitor showed sinus rhythm with occasional PAC's and a brief episode of PAT.  [name] Thyroid disease     Patient Active Problem List   Diagnosis Date Noted  . Traumatic open wound of right lower leg with  infection   . Cellulitis of right lower extremity 06/27/2017  . Aortic atherosclerosis ([place]) 06/22/2017  . Anterior basement membrane dystrophy 04/27/2017  . Anterior corneal dystrophy 04/27/2017  . Protein-calorie malnutrition, severe ([place]) 04/08/2017  . AF (paroxysmal atrial fibrillation) ([place]) 04/03/2017  . Atrial tachycardia ([place]) 04/02/2017  . Chronic respiratory failure with hypoxia ([place]) 03/31/2017  . Chronic diastolic CHF (congestive heart failure) ([place]) 12/27/2016  . Pulmonary hypertension due to COPD ([place]) 10/04/2016  . Renal insufficiency 04/19/2016  . Skin cancer 09/25/2015  . Hypothyroidism 09/25/2015  . Osteoporosis 09/25/2015  . Vitamin D deficiency 01/23/2014  . Medication management 01/23/2014  . Hyperlipidemia 08/10/2013  . Bilateral dry eyes 09/08/2011  . Exposure keratopathy 09/08/2011  . DJD (degenerative joint disease)   . Essential hypertension 05/08/2010  . COPD with emphysema ([place]) 05/08/2010  . Lung nodules 05/08/2010    Past Surgical History:  Procedure Laterality Date  . ABDOMINAL HYSTERECTOMY  1993  . CATARACT EXTRACTION, BILATERAL    . WOUND DEBRIDEMENT Right 06/29/2017   Procedure: RIGHT LOWER  LEG DEBRIDEMENT;  Surgeon: [name], MD;  Location: [facility];  Service: General;  Laterality: Right;    OB History    No data available       Home Medications    Prior to Admission medications   Medication Sig Start Date End Date Taking? Authorizing Provider  ALPRAZolam (  XANAX) 0.25 MG tablet Take 0.125-0.25 mg by mouth at bedtime.    Yes [provider]  apixaban (ELIQUIS) 2.5 MG TABS tablet Take 1 tablet (2.5 mg total) by mouth 2 (two) times daily. 05/26/17  Yes Lelon Perla, MD  Ascorbic Acid (VITAMIN C) 500 MG tablet Take 500 mg by mouth daily.     Yes [provider]  diltiazem (CARDIZEM) 90 MG tablet Take 90 mg by mouth 4 (four) times daily. 05/31/17  Yes [provider]  feeding supplement, ENSURE ENLIVE,  (ENSURE ENLIVE) LIQD Take 237 mLs by mouth 2 (two) times daily between meals. 04/12/17  Yes Rama, Venetia Maxon, MD  furosemide (LASIX) 20 MG tablet Take 20 mg by mouth daily as needed for fluid.    Yes [provider]  levothyroxine (SYNTHROID, LEVOTHROID) 50 MCG tablet TAKE ONE TABLET BY MOUTH ONCE A DAY AS DIRECTED 08/09/17  Yes Unk Pinto, MD  Metoprolol Tartrate 37.5 MG TABS Take 37.5 mg by mouth 2 (two) times daily. 05/31/17  Yes Lelon Perla, MD  metroNIDAZOLE (FLAGYL) 500 MG tablet Take 1 tablet (500 mg total) by mouth 2 (two) times daily. 08/19/17  Yes Isla Pence, MD  nystatin (MYCOSTATIN) 100000 UNIT/ML suspension 5 ml four times a day, retain in mouth as long as possible (Swish and Spit).  Use for 48 hours after symptoms resolve. 08/16/17  Yes Vicie Mutters, PA-C  silver sulfADIAZINE (SILVADENE) 1 % cream Apply to affected area daily 07/14/17 07/14/18 Yes [name], MD  traMADol (ULTRAM) 50 MG tablet Take 1 tablet (50 mg total) by mouth every 6 (six) hours as needed. 07/07/17  Yes [name], MD  cholecalciferol (VITAMIN D) 1000 UNITS tablet Take 1,000 Units by mouth daily.     [provider]  clindamycin (CLEOCIN) 300 MG capsule Take 1 capsule (300 mg total) by mouth 3 (three) times daily. Patient not taking: Reported on 08/19/2017 06/30/17   Isaac Bliss, Rayford Halsted, MD    Family History Family History  Problem Relation Age of Onset  . Emphysema Mother   . Diabetes Mother   . Cancer Father   . Emphysema Sister   . Emphysema Sister   . Rheumatic fever Sister   . Heart attack Sister   . Diabetes Sister   . Diabetes Brother   . Asthma Brother        as a child    Social History Social History   Tobacco Use  . Smoking status: Former Smoker    Packs/day: 1.00    Years: 20.00    Pack years: 20.00    Types: Cigarettes    Last attempt to quit: 09/27/1974    Years since quitting: 42.9  . Smokeless tobacco: Never Used    Substance Use Topics  . Alcohol use: No  . Drug use: No     Allergies   Vancomycin and Sulfa antibiotics   Review of Systems Review of Systems  Constitutional: Negative for fever.  Respiratory: Positive for cough and shortness of breath. Negative for chest tightness.   Cardiovascular: Positive for leg swelling. Negative for chest pain.  Gastrointestinal: Negative for abdominal pain.  Neurological: Positive for weakness (generalized).  All other systems reviewed and are negative.    Physical Exam Updated Vital Signs BP (!) 154/65   Pulse 67   Temp 97.8 F (36.6 C) (Oral)   Resp (!) 28   Ht _0  (1.6 m)   Wt 48.1 kg (106 lb)  SpO2 93%   BMI 18.78 kg/m   Physical Exam  Constitutional: She is oriented to person, place, and time. She appears well-developed.  Frail, chronically ill appearing  HENT:  Head: Normocephalic and atraumatic.  Right Ear: External ear normal.  Left Ear: External ear normal.  Nose: Nose normal.  Eyes: Right eye exhibits no discharge. Left eye exhibits no discharge.  Neck: JVD present.  Cardiovascular: Normal rate, regular rhythm and normal heart sounds.  Pulmonary/Chest: Effort normal. No respiratory distress. She has decreased breath sounds in the right lower field and the left lower field.  Abdominal: Soft. There is no tenderness.  Musculoskeletal:       Right lower leg: She exhibits edema (pitting edema from feet to pretibial).       Left lower leg: She exhibits edema (pitting edema from feet to pretibial).  Neurological: She is alert and oriented to person, place, and time.  Skin: Skin is warm and dry.  Nursing note and vitals reviewed.    ED Treatments / Results  Labs (all labs ordered are listed, but only abnormal results are displayed) Labs Reviewed  CBC WITH DIFFERENTIAL/PLATELET - Abnormal; Notable for the following components:      Result Value   WBC 11.2 (*)    Hemoglobin 11.7 (*)    MCHC 29.8 (*)    Neutro Abs 8.8 (*)     All other components within normal limits  BRAIN NATRIURETIC PEPTIDE - Abnormal; Notable for the following components:   B Natriuretic Peptide 335.0 (*)    All other components within normal limits  COMPREHENSIVE METABOLIC PANEL - Abnormal; Notable for the following components:   Sodium 134 (*)    Chloride 95 (*)    Glucose, Bld 163 (*)    Creatinine, Ser 1.04 (*)    Calcium 8.8 (*)    Albumin 3.0 (*)    ALT 9 (*)    GFR calc non Af Amer 47 (*)    GFR calc Af Amer 55 (*)    All other components within normal limits  TROPONIN I    EKG  EKG Interpretation  Date/Time:  Friday August 26 2017 12:33:09 EST Ventricular Rate:  66 PR Interval:    QRS Duration: 97 QT Interval:  414 QTC Calculation: 434 R Axis:   54 Text Interpretation:  Sinus rhythm Multiple premature complexes, vent & supraven Probable left ventricular hypertrophy Nonspecific T abnrm, anterolateral leads besides PVC, no significant change from Aug 19 2017 Confirmed by Sherwood Gambler [phone]) on 08/26/2017 12:34:56 PM       Radiology Dg Chest 2 View  Result Date: 08/26/2017 CLINICAL DATA:  Patient having increased SOB over the past few days. Patient is on oxygen at home and states that she usually only wears it at night but has been wearing it all day since having SOB. Hx of pna, htn-controlled with medication, COPD. Ex-smoker. EXAM: CHEST  2 VIEW COMPARISON:  06/28/2017 FINDINGS: The lungs are hyperinflated likely secondary to COPD. There are moderate bilateral pleural effusions with bibasilar atelectasis. There is bilateral diffuse interstitial thickening. There is no pneumothorax. Stable cardiomediastinal silhouette. There is no acute osseous abnormality. There is severe osteoarthritis of the right glenohumeral joint. There is mild osteoarthritis of the left glenohumeral joint. IMPRESSION: 1. Findings consistent with CHF. Electronically Signed   By: Kathreen Devoid   On: 08/26/2017 13:36    Procedures Procedures  (including critical care time)  Medications Ordered in ED Medications  albuterol (PROVENTIL) (2.5 MG/3ML) 0.083% nebulizer  solution 5 mg (5 mg Nebulization Given 08/26/17 1243)  aspirin chewable tablet 324 mg (324 mg Oral Given 08/26/17 1327)  furosemide (LASIX) injection 40 mg (40 mg Intravenous Given 08/26/17 1338)     Initial Impression / Assessment and Plan / ED Course  I have reviewed the triage vital signs and the nursing notes.  Pertinent labs & imaging results that were available during my care of the patient were reviewed by me and considered in my medical decision making (see chart for details).     Patient presented with significant hypoxia that has responded to supplemental oxygen.  Work of breathing has improved.  Her chest x-ray shows sizable pleural effusions and this is likely overall fluid overload.  She was given IV Lasix.  She will be admitted to the hospitalist service for diuresis and further workup.  Final Clinical Impressions(s) / ED Diagnoses   Final diagnoses:  Acute on chronic respiratory failure with hypoxia (HCC)  Pleural effusion, bilateral    ED Discharge Orders    None       Sherwood Gambler, MD 08/26/17 1400

## 2017-08-26 NOTE — ED Notes (Signed)
Rt in with pt

## 2017-08-26 NOTE — Progress Notes (Signed)
*  PRELIMINARY RESULTS* Echocardiogram 2D Echocardiogram has been performed.  Leavy Cella 08/26/2017, 4:26 PM

## 2017-08-26 NOTE — ED Notes (Signed)
Started pt on 6L [place] due to sats in 60s. Pt down to 4L [place] at 1213 due to sats  97%

## 2017-08-27 ENCOUNTER — Inpatient Hospital Stay (HOSPITAL_COMMUNITY): Payer: Medicare Other

## 2017-08-27 DIAGNOSIS — I10 Essential (primary) hypertension: Secondary | ICD-10-CM

## 2017-08-27 LAB — COMPREHENSIVE METABOLIC PANEL
ALBUMIN: 2.5 g/dL — AB (ref 3.5–5.0)
ALT: 8 U/L — ABNORMAL LOW (ref 14–54)
ANION GAP: 4 — AB (ref 5–15)
AST: 13 U/L — ABNORMAL LOW (ref 15–41)
Alkaline Phosphatase: 62 U/L (ref 38–126)
BUN: 20 mg/dL (ref 6–20)
CALCIUM: 8 mg/dL — AB (ref 8.9–10.3)
CO2: 37 mmol/L — AB (ref 22–32)
Chloride: 98 mmol/L — ABNORMAL LOW (ref 101–111)
Creatinine, Ser: 1.05 mg/dL — ABNORMAL HIGH (ref 0.44–1.00)
GFR calc non Af Amer: 47 mL/min — ABNORMAL LOW (ref >60)
GFR, EST AFRICAN AMERICAN: 54 mL/min — AB (ref >60)
GLUCOSE: 102 mg/dL — AB (ref 65–99)
POTASSIUM: 4.5 mmol/L (ref 3.5–5.1)
SODIUM: 139 mmol/L (ref 135–145)
Total Bilirubin: 0.3 mg/dL (ref 0.3–1.2)
Total Protein: 5.9 g/dL — ABNORMAL LOW (ref 6.5–8.1)

## 2017-08-27 LAB — CBC
HEMATOCRIT: 35.1 % — AB (ref 36.0–46.0)
HEMOGLOBIN: 10.3 g/dL — AB (ref 12.0–15.0)
MCH: 28.5 pg (ref 26.0–34.0)
MCHC: 29.3 g/dL — AB (ref 30.0–36.0)
MCV: 97.2 fL (ref 78.0–100.0)
Platelets: 328 10*3/uL (ref 150–400)
RBC: 3.61 MIL/uL — AB (ref 3.87–5.11)
RDW: 13.5 % (ref 11.5–15.5)
WBC: 9.4 10*3/uL (ref 4.0–10.5)

## 2017-08-27 MED ORDER — TRAMADOL HCL 50 MG PO TABS: 50.0000 mg | ORAL_TABLET | Freq: Four times a day (QID) | ORAL | Status: DC | PRN

## 2017-08-27 MED ORDER — DILTIAZEM HCL 60 MG PO TABS
90.0000 mg | ORAL_TABLET | Freq: Four times a day (QID) | ORAL | Status: DC
  Administered 2017-08-27 – 2017-08-28 (×3): 90 mg via ORAL
  Filled 2017-08-27 (×3): qty 1

## 2017-08-27 MED ORDER — METOPROLOL TARTRATE 25 MG PO TABS
37.5000 mg | ORAL_TABLET | Freq: Two times a day (BID) | ORAL | Status: DC
  Administered 2017-08-27 (×2): 37.5 mg via ORAL
  Filled 2017-08-27 (×3): qty 2

## 2017-08-27 MED ORDER — METRONIDAZOLE 500 MG PO TABS
500.0000 mg | ORAL_TABLET | Freq: Two times a day (BID) | ORAL | Status: DC
  Administered 2017-08-27 – 2017-08-31 (×8): 500 mg via ORAL
  Filled 2017-08-27 (×9): qty 1

## 2017-08-27 MED ORDER — ALPRAZOLAM 0.25 MG PO TABS
0.1250 mg | ORAL_TABLET | Freq: Every day | ORAL | Status: DC
  Administered 2017-08-28 – 2017-08-30 (×3): 0.25 mg via ORAL
  Administered 2017-08-31 (×2): 0.125 mg via ORAL
  Filled 2017-08-27 (×6): qty 1

## 2017-08-27 MED ORDER — LEVOTHYROXINE SODIUM 50 MCG PO TABS
50.0000 ug | ORAL_TABLET | ORAL | Status: DC
  Administered 2017-08-27 – 2017-09-01 (×6): 50 ug via ORAL
  Filled 2017-08-27 (×6): qty 1

## 2017-08-27 NOTE — Progress Notes (Addendum)
PROGRESS NOTE    Andrea [name]  ODG:[id number] DOB: 01/20/1929 DOA: 08/26/2017 PCP: Unk Pinto, MD    Brief Narrative:  Andrea [name] is a 83 y.o. female with medical history significant of aortic insufficiency, aortic regurg, COPD, DJD, HLD brought in by her daughters for concern of shortness of breath and weakness. Patient reports that 3-4 days ago she began having symptoms but her daughter says that her symptoms actually began a week ago.  Patient was taken off lasix about 2-3 weeks ago.  Per her daughter this was due to decreased kidney function.  Since that time patient's legs have increased in swelling. Patient denies fevers, chills.  No palpitations, no diarrhea but patient daughter reports she is being treated for C. Diff and that this is her last day of treatment.   ED Course: Patient was seen by emergency room physician and found to have large bilateral pleural effusions and questionable volume overload.  She was given a 40 mg dose of IV Lasix and TRH was asked for admission for further evaluation and treatment of her bilateral pleural effusions causing acute hypoxic respiratory failure.     Assessment & Plan:   Principal Problem:   Pleural effusion Active Problems:   Essential hypertension   COPD with emphysema (HCC)   Hyperlipidemia   Hypothyroidism   Chronic diastolic CHF (congestive heart failure) (HCC)   AF (paroxysmal atrial fibrillation) (HCC)   Cellulitis of right lower extremity   Bilateral pleural effusions -Repeat chest x-ray today and again tomorrow -Patient respiratory status is stable on 2 L of oxygen right now - We will draw daily BMPs -Lasix 52m daily -We will follow serial chest x-rays   Chronic diastolic congestive heart failure -Echocardiogram showing grade 2 diastolic dysfunction with an EF of 60-65% -Diuresis with Lasix -Daily weights (weight does not appear to be adequately recorded) -Strict I's and O's (net -1.1 L since  admission) -1500 mL fluid restriction - Daily BMP -Creatinine is stable from yesterday  Essential hypertension -Continue Cardizem and metoprolol  Questionable diagnosis of C. difficile -C. difficile PCR sent on 1121 2018+ -Patient previously on Flagyl but will need a full 10-day course -Patient has completed almost 9 days at time of admission -Reorder Flagyl for 1 subsequent day to complete course  Hypothyroidism -We will continue levothyroxine  Paroxysmal atrial fibrillation -We will continue metoprolol -We will continue Eliquis  Cellulitis of the right lower extremity -Wound care consult - Courtesy inform Dr. BConstance Hawof patient's admission to the hospital  Malnutrition - Ensure 3 times daily     DVT prophylaxis: Eliquis Code Status: Full code Family Communication: No family is bedside Disposition Plan: Likely discharge home in the next 48-72 hours pending improvement in respiratory status   Consultants:   None  Procedures:   None  Antimicrobials:   Flagyl- started outpatient on 11/21(needs 1 more day for a total of 10 days)   Subjective: Patient seen and examined.  She says that she has not had a bowel movement since admission to the hospital.  She reports minimal abdominal pain.  She does voice that she is cold in her room but denies any shortness of breath and feels good on supplemental oxygen.  Objective: Vitals:   08/26/17 1703 08/26/17 2040 08/26/17 2116 08/27/17 0457  BP: (!) 131/40 130/60  (!) 144/69  Pulse: 66 80 64 85  Resp: _0 Temp: 97.9 F (36.6 C) 97.8 F (36.6 C)  97.8 F (36.6 C)  TempSrc: Oral Oral  Oral  SpO2: 94% 97% 97% 98%  Weight: 51.9 kg (114 lb 6.7 oz)   51.6 kg (113 lb 12.1 oz)  Height: _0  (1.6 m)       Intake/Output Summary (Last 24 hours) at 08/27/2017 1051 Last data filed at 08/27/2017 0900 Gross per 24 hour  Intake 240 ml  Output 1100 ml  Net -860 ml   Filed Weights   08/26/17 1210 08/26/17 1703  08/27/17 0457  Weight: 48.1 kg (106 lb) 51.9 kg (114 lb 6.7 oz) 51.6 kg (113 lb 12.1 oz)    Examination:  General exam: Appears calm and comfortable, elderly cachectic female Respiratory system: Diminished breath sounds in lung bases bilaterally, dullness to percussion to midlung fields bilaterally, clear upper lung fields bilaterally Cardiovascular system: S1 & S2 heard, RRR. No JVD, murmurs, rubs, gallops or clicks.  2+ pitting edema to knees bilaterally Gastrointestinal system: Abdomen is nondistended, soft and nontender. No organomegaly or masses felt. Normal bowel sounds heard. Central nervous system: Alert and oriented. No focal neurological deficits. Extremities: Symmetric 5 x 5 power. Skin: Skin ulcer noted on right lower extremity anterior shin covered with bandage  psychiatry: Judgement and insight appear normal. Mood & affect appropriate.     Data Reviewed: I have personally reviewed following labs and imaging studies  CBC: Recent Labs  Lab 08/26/17 1210 08/27/17 0615  WBC 11.2* 9.4  NEUTROABS 8.8*  --   HGB 11.7* 10.3*  HCT 39.3 35.1*  MCV 94.7 97.2  PLT 384 622   Basic Metabolic Panel: Recent Labs  Lab 08/26/17 1210 08/27/17 0615  NA 134* 139  K 4.0 4.5  CL 95* 98*  CO2 31 37*  GLUCOSE 163* 102*  BUN 20 20  CREATININE 1.04* 1.05*  CALCIUM 8.8* 8.0*   GFR: Estimated Creatinine Clearance: 31.3 mL/min (A) (by C-G formula based on SCr of 1.05 mg/dL (H)). Liver Function Tests: Recent Labs  Lab 08/26/17 1210 08/27/17 0615  AST 18 13*  ALT 9* 8*  ALKPHOS 78 62  BILITOT 0.5 0.3  PROT 7.6 5.9*  ALBUMIN 3.0* 2.5*   No results for input(s): LIPASE, AMYLASE in the last 168 hours. No results for input(s): AMMONIA in the last 168 hours. Coagulation Profile: No results for input(s): INR, PROTIME in the last 168 hours. Cardiac Enzymes: Recent Labs  Lab 08/26/17 1210  TROPONINI <0.03   BNP (last 3 results) No results for input(s): PROBNP in the last  8760 hours. HbA1C: No results for input(s): HGBA1C in the last 72 hours. CBG: No results for input(s): GLUCAP in the last 168 hours. Lipid Profile: No results for input(s): CHOL, HDL, LDLCALC, TRIG, CHOLHDL, LDLDIRECT in the last 72 hours. Thyroid Function Tests: No results for input(s): TSH, T4TOTAL, FREET4, T3FREE, THYROIDAB in the last 72 hours. Anemia Panel: No results for input(s): VITAMINB12, FOLATE, FERRITIN, TIBC, IRON, RETICCTPCT in the last 72 hours. Sepsis Labs: No results for input(s): PROCALCITON, LATICACIDVEN in the last 168 hours.  No results found for this or any previous visit (from the past 240 hour(s)).       Radiology Studies: Dg Chest 2 View  Result Date: 08/26/2017 CLINICAL DATA:  Patient having increased SOB over the past few days. Patient is on oxygen at home and states that she usually only wears it at night but has been wearing it all day since having SOB. Hx of pna, htn-controlled with medication, COPD. Ex-smoker. EXAM: CHEST  2 VIEW COMPARISON:  06/28/2017 FINDINGS: The  lungs are hyperinflated likely secondary to COPD. There are moderate bilateral pleural effusions with bibasilar atelectasis. There is bilateral diffuse interstitial thickening. There is no pneumothorax. Stable cardiomediastinal silhouette. There is no acute osseous abnormality. There is severe osteoarthritis of the right glenohumeral joint. There is mild osteoarthritis of the left glenohumeral joint. IMPRESSION: 1. Findings consistent with CHF. Electronically Signed   By: Kathreen Devoid   On: 08/26/2017 13:36        Scheduled Meds: . ALPRAZolam  0.125-0.25 mg Oral QHS  . apixaban  2.5 mg Oral BID  . diltiazem  90 mg Oral QID  . furosemide  40 mg Oral Daily  . levothyroxine  50 mcg Oral BH-q7a  . metoprolol tartrate  37.5 mg Oral BID  . metroNIDAZOLE  500 mg Oral BID   Continuous Infusions:   LOS: 1 day    Time spent: 35 minutes    Loretha Stapler, MD [facility] Pager  [phone]  If 7PM-7AM, please contact night-coverage [email or web address] Password [email or web address] 08/27/2017, 10:51 AM

## 2017-08-27 NOTE — Progress Notes (Signed)
Late entry:  Wound to right anterior leg cleansed with NS and dressing changed.

## 2017-08-28 ENCOUNTER — Inpatient Hospital Stay (HOSPITAL_COMMUNITY): Payer: Medicare Other

## 2017-08-28 LAB — BASIC METABOLIC PANEL
Anion gap: 4 — ABNORMAL LOW (ref 5–15)
BUN: 20 mg/dL (ref 6–20)
CHLORIDE: 93 mmol/L — AB (ref 101–111)
CO2: 41 mmol/L — AB (ref 22–32)
CREATININE: 1 mg/dL (ref 0.44–1.00)
Calcium: 8 mg/dL — ABNORMAL LOW (ref 8.9–10.3)
GFR calc Af Amer: 57 mL/min — ABNORMAL LOW (ref >60)
GFR calc non Af Amer: 50 mL/min — ABNORMAL LOW (ref >60)
Glucose, Bld: 109 mg/dL — ABNORMAL HIGH (ref 65–99)
Potassium: 4 mmol/L (ref 3.5–5.1)
SODIUM: 138 mmol/L (ref 135–145)

## 2017-08-28 LAB — MAGNESIUM: Magnesium: 1.6 mg/dL — ABNORMAL LOW (ref 1.7–2.4)

## 2017-08-28 MED ORDER — METOPROLOL TARTRATE 25 MG PO TABS
12.5000 mg | ORAL_TABLET | Freq: Two times a day (BID) | ORAL | Status: DC
  Administered 2017-08-28 – 2017-08-30 (×4): 12.5 mg via ORAL
  Filled 2017-08-28 (×4): qty 1

## 2017-08-28 MED ORDER — ORAL CARE MOUTH RINSE
15.0000 mL | Freq: Two times a day (BID) | OROMUCOSAL | Status: DC
  Administered 2017-08-28 – 2017-09-01 (×8): 15 mL via OROMUCOSAL

## 2017-08-28 MED ORDER — FUROSEMIDE 20 MG PO TABS
20.0000 mg | ORAL_TABLET | Freq: Once | ORAL | Status: AC
  Administered 2017-08-28: 20 mg via ORAL
  Filled 2017-08-28: qty 1

## 2017-08-28 MED ORDER — DILTIAZEM HCL 30 MG PO TABS
30.0000 mg | ORAL_TABLET | Freq: Three times a day (TID) | ORAL | Status: DC
  Administered 2017-08-28 – 2017-08-30 (×6): 30 mg via ORAL
  Filled 2017-08-28 (×6): qty 1

## 2017-08-28 MED ORDER — MAGNESIUM SULFATE 2 GM/50ML IV SOLN
2.0000 g | Freq: Once | INTRAVENOUS | Status: AC
  Administered 2017-08-28: 2 g via INTRAVENOUS
  Filled 2017-08-28: qty 50

## 2017-08-28 MED ORDER — ENOXAPARIN SODIUM 60 MG/0.6ML ~~LOC~~ SOLN
50.0000 mg | Freq: Two times a day (BID) | SUBCUTANEOUS | Status: DC
  Filled 2017-08-28 (×4): qty 0.6

## 2017-08-28 MED ORDER — DILTIAZEM HCL 60 MG PO TABS
60.0000 mg | ORAL_TABLET | Freq: Three times a day (TID) | ORAL | Status: DC
  Filled 2017-08-28: qty 1

## 2017-08-28 NOTE — Progress Notes (Signed)
Pt's last two BP were 103/46 and 104/47.  Paged Dr Adair Patter who will change medication dose of Metoprolol and Diltiazem.  Also, gave verbal order to discontinue tele.

## 2017-08-28 NOTE — Progress Notes (Signed)
ANTICOAGULATION CONSULT NOTE - Initial Consult  Pharmacy Consult for LOVENOX Indication: atrial fibrillation  Allergies  Allergen Reactions  . Vancomycin     Worsening kidney function  . Sulfa Antibiotics Palpitations    Unknown   Patient Measurements: Height: _0  (160 cm) Weight: 112 lb 10.5 oz (51.1 kg) IBW/kg (Calculated) : 52.4  Vital Signs: Temp: 98.1 F (36.7 C) (12/02 1433) Temp Source: Oral (12/02 1433) BP: 114/60 (12/02 1433) Pulse Rate: 87 (12/02 1433)  Labs: Recent Labs    08/26/17 1210 08/27/17 0615 08/28/17 0524  HGB 11.7* 10.3*  --   HCT 39.3 35.1*  --   PLT 384 328  --   CREATININE 1.04* 1.05* 1.00  TROPONINI <0.03  --   --     Estimated Creatinine Clearance: 32.6 mL/min (by C-G formula based on SCr of 1 mg/dL).   Medical History: Past Medical History:  Diagnosis Date  . Aortic insufficiency 09/04/2015  . Aortic regurgitation   . Bronchiectasis ([place])   . COPD (chronic obstructive pulmonary disease) ([place])   . DJD (degenerative joint disease)   . HLD (hyperlipidemia)   . HTN (hypertension)   . Mild mitral regurgitation by prior echocardiogram   . Mild mitral regurgitation by prior echocardiogram   . Mild tricuspid regurgitation   . Palpitations    a. has known history of PAC's and PVC's. b. 09/2015: monitor showed sinus rhythm with occasional PAC's and a brief episode of PAT.  [name] Thyroid disease     Medications:  Medications Prior to Admission  Medication Sig Dispense Refill Last Dose  . ALPRAZolam (XANAX) 0.25 MG tablet Take 0.125-0.25 mg by mouth at bedtime.    08/25/2017 at Unknown time  . apixaban (ELIQUIS) 2.5 MG TABS tablet Take 1 tablet (2.5 mg total) by mouth 2 (two) times daily. 180 tablet 3 08/25/2017 at 1130  . Ascorbic Acid (VITAMIN C) 500 MG tablet Take 500 mg by mouth daily.     Past Month at Unknown time  . cholecalciferol (VITAMIN D) 1000 UNITS tablet Take 1,000 Units by mouth daily.    Past Month at Unknown time  .  diltiazem (CARDIZEM) 90 MG tablet Take 90 mg by mouth 4 (four) times daily.   08/25/2017 at Unknown time  . feeding supplement, ENSURE ENLIVE, (ENSURE ENLIVE) LIQD Take 237 mLs by mouth 2 (two) times daily between meals. 237 mL 12 unknown  . furosemide (LASIX) 20 MG tablet Take 20 mg by mouth daily as needed for fluid.    unknown  . levothyroxine (SYNTHROID, LEVOTHROID) 50 MCG tablet TAKE ONE TABLET BY MOUTH ONCE A DAY AS DIRECTED 90 tablet 1 08/25/2017 at Unknown time  . Metoprolol Tartrate 37.5 MG TABS Take 37.5 mg by mouth 2 (two) times daily. 180 tablet 3 08/25/2017 at 1130  . metroNIDAZOLE (FLAGYL) 500 MG tablet Take 1 tablet (500 mg total) by mouth 2 (two) times daily. 14 tablet 0 08/25/2017 at Unknown time  . nystatin (MYCOSTATIN) 100000 UNIT/ML suspension 5 ml four times a day, retain in mouth as long as possible (Swish and Spit).  Use for 48 hours after symptoms resolve. 80 mL 0 08/25/2017 at Unknown time  . Probiotic Product (PROBIOTIC-10) CHEW Chew 2 tablets by mouth daily.   08/25/2017 at Unknown time  . silver sulfADIAZINE (SILVADENE) 1 % cream Apply to affected area daily 50 g 1 08/25/2017 at Unknown time  . traMADol (ULTRAM) 50 MG tablet Take 1 tablet (50 mg total) by mouth every 6 (six)  hours as needed. 20 tablet 0 unknown  . clindamycin (CLEOCIN) 300 MG capsule Take 1 capsule (300 mg total) by mouth 3 (three) times daily. (Patient not taking: Reported on 08/19/2017) 30 capsule 0 Not Taking at Unknown time   Assessment: 83yo female on Eliquis for Afib.  Eliquis d/c'd >> asked to initiate Lovenox.    Goal of Therapy:  Anticoagulation for afib Monitor platelets by anticoagulation protocol: Yes   Plan:  Lovenox 47m/Kg SQ q12hrs Monitor labs, progress, CBC  Andrea [name] A 08/28/2017,5:40 PM

## 2017-08-28 NOTE — Progress Notes (Signed)
Opyd, MD text paged for notification of HR decrease to 30s per tele. This very low HR was not sustained. Pauses noted on tele monitor. Patient sleeping. No distress noted at this time. No response or new orders as of yet. Will continue to monitor.

## 2017-08-28 NOTE — Progress Notes (Signed)
Patient ambulated to bathroom with assistance with O2 [place]. SpO2 dropped to 70% on RA. O2 4L [place] applied. Patient encouraged to take long deep breaths in through her nostrils and exhale through mouth. SpO2 slowly improved. SpO2 was only 91-92% on O2 3L [place]. O2 maintained at 3.5L [place]. SpO2 95%. O2 extension tubing added, and patient encouraged to leave O2 on at all times, even when toileting. Patient agrees. Continuous SpO2 in place. Will continue to monitor.

## 2017-08-28 NOTE — Progress Notes (Signed)
PROGRESS NOTE    RAKHI ROMAGNOLI  XAJ:[id number] DOB: [birth year] DOA: 08/26/2017 PCP: Unk Pinto, MD    Brief Narrative:  Andrea [name] is a 83 y.o. female with medical history significant of aortic insufficiency, aortic regurg, COPD, DJD, HLD brought in by her daughters for concern of shortness of breath and weakness. Patient reports that 3-4 days ago she began having symptoms but her daughter says that her symptoms actually began a week ago.  Patient was taken off lasix about 2-3 weeks ago.  Per her daughter this was due to decreased kidney function.  Since that time patient's legs have increased in swelling. Patient denies fevers, chills.  No palpitations, no diarrhea but patient daughter reports she is being treated for C. Diff and that this is her last day of treatment.   ED Course: Patient was seen by emergency room physician and found to have large bilateral pleural effusions and questionable volume overload.  She was given a 40 mg dose of IV Lasix and TRH was asked for admission for further evaluation and treatment of her bilateral pleural effusions causing acute hypoxic respiratory failure.  Patient was begun on diuresis, her Lasix dose from home was doubled.  Serial chest x-rays were obtained to monitor the size of the pleural effusions.   Assessment & Plan:   Principal Problem:   Pleural effusion Active Problems:   Essential hypertension   COPD with emphysema (HCC)   Hyperlipidemia   Hypothyroidism   Chronic diastolic CHF (congestive heart failure) (HCC)   AF (paroxysmal atrial fibrillation) (HCC)   Cellulitis of right lower extremity   Bilateral pleural effusions -Repeat  chest x-ray showing no real change in size of pleural effusions -We will order thoracentesis for tomorrow -Wean oxygen as tolerated -Lasix 37m daily to be increased to twice a day   Chronic diastolic congestive heart failure -Echocardiogram showing grade 2 diastolic dysfunction with an EF of  60-65% -Diuresis with Lasix -Strict I's and O's (net - 2.0 L since admission) -1500 mL fluid restriction - Daily BMP -Creatinine is stable from yesterday  Hypomagnesemia -Replete with 2 g IV magnesium  Essential hypertension -Continue Cardizem and metoprolol  Questionable diagnosis of C. difficile -C. difficile PCR sent on 1121 2018+ -Patient previously on Flagyl but will need a full 10-day course -Upon discussing with patient's daughter patient is actually only received today will be day 8 of 10 days of antibiotics -We will need to subsequent days from today for full treatment  Hypothyroidism -We will continue levothyroxine  Paroxysmal atrial fibrillation -We will continue metoprolol -We will continue Eliquis  Cellulitis of the right lower extremity -Wound care consult  Malnutrition - Ensure 3 times daily     DVT prophylaxis: Eliquis Code Status: Full code Family Communication: No family is bedside Disposition Plan: Thoracentesis tomorrow   Consultants:   None  Procedures:   None  Antimicrobials:   Flagyl- started outpatient on 11/2(needs 2 more day for a total of 10 days)   Subjective: Patient seen and examined.  She just had her repeat chest x-ray.  Says that she feels as though her work of breathing has improved.  Is wanting to go home but understands that there is still a substantial amount of fluid in her chest.  Asks if she can get up and walk around today.  Objective: Vitals:   08/28/17 0119 08/28/17 0132 08/28/17 0339 08/28/17 1015  BP:   (!) 103/46 (!) 104/47  Pulse:   61 90  Resp:  20 20  Temp:   98 F (36.7 C) 98.1 F (36.7 C)  TempSrc:   Oral Oral  SpO2: 95% 95% 97% 99%  Weight:   51.1 kg (112 lb 10.5 oz)   Height:        Intake/Output Summary (Last 24 hours) at 08/28/2017 1106 Last data filed at 08/28/2017 0610 Gross per 24 hour  Intake 550 ml  Output 1700 ml  Net -1150 ml   Filed Weights   08/26/17 1703 08/27/17 0457  08/28/17 0339  Weight: 51.9 kg (114 lb 6.7 oz) 51.6 kg (113 lb 12.1 oz) 51.1 kg (112 lb 10.5 oz)    Examination:  General exam: Appears calm and comfortable, elderly cachectic female Respiratory system: Significantly diminished breath sounds in lung bases bilaterally, dullness to percussion to midlung fields bilaterally, clear upper lung fields bilaterally Cardiovascular system: S1 & S2 heard, RRR. No JVD, murmurs, rubs, gallops or clicks.  2+ pitting edema to knees bilaterally Gastrointestinal system: Abdomen is nondistended, soft and nontender. No organomegaly or masses felt. Normal bowel sounds heard. Central nervous system: Alert and oriented. No focal neurological deficits. Extremities: Symmetric 5 x 5 power. Skin: Bandage over chronic skin ulcer on right lower extremity psychiatry: Judgement and insight appear normal. Mood & affect appropriate.     Data Reviewed: I have personally reviewed following labs and imaging studies  CBC: Recent Labs  Lab 08/26/17 1210 08/27/17 0615  WBC 11.2* 9.4  NEUTROABS 8.8*  --   HGB 11.7* 10.3*  HCT 39.3 35.1*  MCV 94.7 97.2  PLT 384 387   Basic Metabolic Panel: Recent Labs  Lab 08/26/17 1210 08/27/17 0615 08/28/17 0524  NA 134* 139 138  K 4.0 4.5 4.0  CL 95* 98* 93*  CO2 31 37* 41*  GLUCOSE 163* 102* 109*  BUN _0 CREATININE 1.04* 1.05* 1.00  CALCIUM 8.8* 8.0* 8.0*  MG  --   --  1.6*   GFR: Estimated Creatinine Clearance: 32.6 mL/min (by C-G formula based on SCr of 1 mg/dL). Liver Function Tests: Recent Labs  Lab 08/26/17 1210 08/27/17 0615  AST 18 13*  ALT 9* 8*  ALKPHOS 78 62  BILITOT 0.5 0.3  PROT 7.6 5.9*  ALBUMIN 3.0* 2.5*   No results for input(s): LIPASE, AMYLASE in the last 168 hours. No results for input(s): AMMONIA in the last 168 hours. Coagulation Profile: No results for input(s): INR, PROTIME in the last 168 hours. Cardiac Enzymes: Recent Labs  Lab 08/26/17 1210  TROPONINI <0.03   BNP  (last 3 results) No results for input(s): PROBNP in the last 8760 hours. HbA1C: No results for input(s): HGBA1C in the last 72 hours. CBG: No results for input(s): GLUCAP in the last 168 hours. Lipid Profile: No results for input(s): CHOL, HDL, LDLCALC, TRIG, CHOLHDL, LDLDIRECT in the last 72 hours. Thyroid Function Tests: No results for input(s): TSH, T4TOTAL, FREET4, T3FREE, THYROIDAB in the last 72 hours. Anemia Panel: No results for input(s): VITAMINB12, FOLATE, FERRITIN, TIBC, IRON, RETICCTPCT in the last 72 hours. Sepsis Labs: No results for input(s): PROCALCITON, LATICACIDVEN in the last 168 hours.  No results found for this or any previous visit (from the past 240 hour(s)).       Radiology Studies: Dg Chest 2 View  Result Date: 08/26/2017 CLINICAL DATA:  Patient having increased SOB over the past few days. Patient is on oxygen at home and states that she usually only wears it at night but has been wearing it  all day since having SOB. Hx of pna, htn-controlled with medication, COPD. Ex-smoker. EXAM: CHEST  2 VIEW COMPARISON:  06/28/2017 FINDINGS: The lungs are hyperinflated likely secondary to COPD. There are moderate bilateral pleural effusions with bibasilar atelectasis. There is bilateral diffuse interstitial thickening. There is no pneumothorax. Stable cardiomediastinal silhouette. There is no acute osseous abnormality. There is severe osteoarthritis of the right glenohumeral joint. There is mild osteoarthritis of the left glenohumeral joint. IMPRESSION: 1. Findings consistent with CHF. Electronically Signed   By: Kathreen Devoid   On: 08/26/2017 13:36   Dg Chest Port 1 View  Result Date: 08/28/2017 CLINICAL DATA:  Hypoxemia, bilateral pleural effusions EXAM: PORTABLE CHEST 1 VIEW COMPARISON:  08/27/2017 FINDINGS: Moderate to large bilateral pleural effusions. Diffuse bilateral airspace disease again noted. No real change since prior study. Cardiomegaly. IMPRESSION: Severe  diffuse bilateral airspace disease and a large effusions. No real change. Electronically Signed   By: Rolm Baptise M.D.   On: 08/28/2017 10:27   Dg Chest Port 1 View  Result Date: 08/27/2017 CLINICAL DATA:  Pleural effusion. EXAM: PORTABLE CHEST 1 VIEW COMPARISON:  08/26/2017 FINDINGS: Moderate bilateral pleural effusions. Diffuse bilateral airspace disease, likely edema/ CHF. Cardiomegaly. Slight interval worsening edema since prior study. IMPRESSION: Worsening aeration of the lungs compatible with worsening edema/ CHF. Moderate bilateral effusions are stable. Electronically Signed   By: Rolm Baptise M.D.   On: 08/27/2017 13:19        Scheduled Meds: . ALPRAZolam  0.125-0.25 mg Oral QHS  . apixaban  2.5 mg Oral BID  . diltiazem  30 mg Oral Q8H  . furosemide  40 mg Oral Daily  . levothyroxine  50 mcg Oral BH-q7a  . mouth rinse  15 mL Mouth Rinse BID  . metoprolol tartrate  12.5 mg Oral BID  . metroNIDAZOLE  500 mg Oral BID   Continuous Infusions:   LOS: 2 days    Time spent: 25 minutes    Loretha Stapler, MD [facility] Pager [phone]  If 7PM-7AM, please contact night-coverage [email or web address] Password [email or web address] 08/28/2017, 11:06 AM

## 2017-08-29 ENCOUNTER — Inpatient Hospital Stay (HOSPITAL_COMMUNITY): Payer: Medicare Other

## 2017-08-29 LAB — CBC
HEMATOCRIT: 38 % (ref 36.0–46.0)
HEMOGLOBIN: 11.1 g/dL — AB (ref 12.0–15.0)
MCH: 28.6 pg (ref 26.0–34.0)
MCHC: 29.2 g/dL — ABNORMAL LOW (ref 30.0–36.0)
MCV: 97.9 fL (ref 78.0–100.0)
Platelets: 376 10*3/uL (ref 150–400)
RBC: 3.88 MIL/uL (ref 3.87–5.11)
RDW: 13.7 % (ref 11.5–15.5)
WBC: 16.1 10*3/uL — ABNORMAL HIGH (ref 4.0–10.5)

## 2017-08-29 LAB — ALBUMIN, PLEURAL OR PERITONEAL FLUID: ALBUMIN FL: 1.4 g/dL

## 2017-08-29 LAB — PROTEIN, TOTAL: Total Protein: 6.8 g/dL (ref 6.5–8.1)

## 2017-08-29 LAB — BASIC METABOLIC PANEL
Anion gap: 10 (ref 5–15)
BUN: 25 mg/dL — AB (ref 6–20)
CHLORIDE: 90 mmol/L — AB (ref 101–111)
CO2: 39 mmol/L — AB (ref 22–32)
Calcium: 8.5 mg/dL — ABNORMAL LOW (ref 8.9–10.3)
Creatinine, Ser: 0.99 mg/dL (ref 0.44–1.00)
GFR calc Af Amer: 58 mL/min — ABNORMAL LOW (ref >60)
GFR calc non Af Amer: 50 mL/min — ABNORMAL LOW (ref >60)
GLUCOSE: 119 mg/dL — AB (ref 65–99)
POTASSIUM: 4 mmol/L (ref 3.5–5.1)
SODIUM: 139 mmol/L (ref 135–145)

## 2017-08-29 LAB — BODY FLUID CELL COUNT WITH DIFFERENTIAL
EOS FL: 0 %
Lymphs, Fluid: 35 %
Monocyte-Macrophage-Serous Fluid: 57 % (ref 50–90)
NEUTROPHIL FLUID: 8 % (ref 0–25)
Other Cells, Fluid: 0 %
WBC FLUID: 894 uL (ref 0–1000)

## 2017-08-29 LAB — GRAM STAIN: GRAM STAIN: NONE SEEN

## 2017-08-29 LAB — GLUCOSE, PLEURAL OR PERITONEAL FLUID: Glucose, Fluid: 144 mg/dL

## 2017-08-29 LAB — ALBUMIN: Albumin: 2.8 g/dL — ABNORMAL LOW (ref 3.5–5.0)

## 2017-08-29 LAB — LACTATE DEHYDROGENASE, PLEURAL OR PERITONEAL FLUID: LD, Fluid: 61 U/L — ABNORMAL HIGH (ref 3–23)

## 2017-08-29 LAB — LACTATE DEHYDROGENASE: LDH: 129 U/L (ref 98–192)

## 2017-08-29 LAB — PROTEIN, PLEURAL OR PERITONEAL FLUID

## 2017-08-29 MED ORDER — MUPIROCIN 2 % EX OINT
TOPICAL_OINTMENT | Freq: Two times a day (BID) | CUTANEOUS | Status: DC
  Administered 2017-08-29 – 2017-09-01 (×7): via TOPICAL
  Filled 2017-08-29 (×2): qty 22

## 2017-08-29 MED ORDER — FUROSEMIDE 40 MG PO TABS
40.0000 mg | ORAL_TABLET | Freq: Two times a day (BID) | ORAL | Status: DC
  Administered 2017-08-30: 40 mg via ORAL
  Filled 2017-08-29: qty 1

## 2017-08-29 MED ORDER — MAGNESIUM SULFATE 2 GM/50ML IV SOLN
2.0000 g | Freq: Once | INTRAVENOUS | Status: AC
  Administered 2017-08-29: 2 g via INTRAVENOUS
  Filled 2017-08-29: qty 50

## 2017-08-29 MED ORDER — DILTIAZEM HCL 30 MG PO TABS
30.0000 mg | ORAL_TABLET | Freq: Once | ORAL | Status: AC
  Administered 2017-08-29: 30 mg via ORAL
  Filled 2017-08-29: qty 1

## 2017-08-29 MED ORDER — METOPROLOL TARTRATE 5 MG/5ML IV SOLN
5.0000 mg | Freq: Once | INTRAVENOUS | Status: AC
  Administered 2017-08-29: 5 mg via INTRAVENOUS
  Filled 2017-08-29: qty 5

## 2017-08-29 NOTE — Consult Note (Signed)
[place] Nurse wound consult note Reason for Consult:Trauma wound to right anterior lower leg, present on admission.   Wound type:Trauma Pressure Injury POA: NA Measurement: 2 cm x 1 cm x 0.1 cm  Wound bed:dry, scabbed Drainage (amount, consistency, odor) none Periwound:erythema Dressing procedure/placement/frequency:Cleanse wound to right anterior lower leg with NS.  Apply mupirocin ointment twice daily. Cover with dry dressing Will not follow at this time.  Please re-consult if needed.  Domenic Moras RN BSN [place] Pager [phone]

## 2017-08-29 NOTE — Procedures (Signed)
Uncomplicated left thoracentesis using [place].   1L [place] fluid could be aspirated.  JWatts MD

## 2017-08-29 NOTE — Evaluation (Signed)
Physical Therapy Evaluation Patient Details Name: Andrea [name] MRN: [id number] DOB: Jun 17, 1929 Today's Date: 08/29/2017   History of Present Illness  Andrea [name] is a 83 y.o. female with medical history significant of aortic insufficiency, aortic regurg, COPD, DJD, HLD brought in by her daughters for concern of shortness of breath and weakness. Patient reports that 3-4 days ago she began having symptoms but her daughter says that her symptoms actually began a week ago.  Patient was taken off lasix about 2-3 weeks ago.  Per her daughter this was due to decreased kidney function.  Since that time patient's legs have increased in swelling. Patient denies fevers, chills.  No palpitations, no diarrhea but patient daughter reports she is being treated for C. Diff and that this is her last day of treatment.     Clinical Impression  Patient demonstrates slightly labored functional mobility and gait, treatment limited due to patient having to go to a procedure and demonstrated good return for transferring to a gurney.  Patient will benefit from continued physical therapy in hospital and recommended venue below to increase strength, balance, endurance for safe ADLs and gait.    Follow Up Recommendations Home health PT;Supervision for mobility/OOB    Equipment Recommendations  None recommended by PT    Recommendations for Other Services       Precautions / Restrictions Precautions Precautions: Fall Restrictions Weight Bearing Restrictions: No      Mobility  Bed Mobility Overal bed mobility: Needs Assistance Bed Mobility: Supine to Sit;Sit to Supine     Supine to sit: Supervision Sit to supine: Supervision   General bed mobility comments: with head of bed raised  Transfers Overall transfer level: Needs assistance Equipment used: Rolling walker (2 wheeled) Transfers: Sit to/from [facility] Sit to Stand: Min guard Stand pivot transfers: Min guard           Ambulation/Gait Ambulation/Gait assistance: Min assist Ambulation Distance (Feet): 18 Feet Assistive device: Rolling walker (2 wheeled) Gait Pattern/deviations: Decreased step length - right;Decreased step length - left;Decreased stride length   Gait velocity interpretation: Below normal speed for age/gender General Gait Details: demonstrates slightly labored cadence without loss of balance, limited secondary to fatigue and having to go to a procedure  Stairs            Wheelchair Mobility    Modified Rankin (Stroke Patients Only)       Balance Overall balance assessment: Needs assistance Sitting-balance support: Feet supported;No upper extremity supported Sitting balance-Leahy Scale: Good     Standing balance support: Bilateral upper extremity supported;During functional activity Standing balance-Leahy Scale: Fair                               Pertinent Vitals/Pain Pain Assessment: No/denies pain    Home Living Family/patient expects to be discharged to:: Private residence Living Arrangements: Spouse/significant other Available Help at Discharge: Family Type of Home: House Home Access: Stairs to enter Entrance Stairs-Rails: None Entrance Stairs-Number of Steps: 3 Home Layout: One level Home Equipment: Environmental consultant - 4 wheels;Shower seat;Bedside commode;Wheelchair - manual      Prior Function Level of Independence: Independent with assistive device(s)               Hand Dominance        Extremity/Trunk Assessment   Upper Extremity Assessment Upper Extremity Assessment: Generalized weakness    Lower Extremity Assessment Lower Extremity Assessment: Generalized weakness  Cervical / Trunk Assessment Cervical / Trunk Assessment: Normal  Communication   Communication: No difficulties  Cognition Arousal/Alertness: Awake/alert Behavior During Therapy: WFL for tasks assessed/performed Overall Cognitive Status: Within Functional Limits  for tasks assessed                                        General Comments      Exercises     Assessment/Plan    PT Assessment Patient needs continued PT services  PT Problem List Decreased strength;Decreased activity tolerance;Decreased balance;Decreased mobility       PT Treatment Interventions Gait training;Functional mobility training;Therapeutic activities;Stair training;Therapeutic exercise;Patient/family education    PT Goals (Current goals can be found in the Care Plan section)  Acute Rehab PT Goals Patient Stated Goal: return home PT Goal Formulation: With patient Time For Goal Achievement: 09/02/17 Potential to Achieve Goals: Good    Frequency Min 3X/week   Barriers to discharge        Co-evaluation               AM-PAC PT "6 Clicks" Daily Activity  Outcome Measure Difficulty turning over in bed (including adjusting bedclothes, sheets and blankets)?: None Difficulty moving from lying on back to sitting on the side of the bed? : None   Help needed moving to and from a bed to chair (including a wheelchair)?: A Little Help needed walking in hospital room?: A Little Help needed climbing 3-5 steps with a railing? : A Little 6 Click Score: 17    End of Session   Activity Tolerance: Patient tolerated treatment well;Patient limited by fatigue Patient left: in bed;Other (comment)(patient transferred to gurney and taken to a procedure by nursing staff)   PT Visit Diagnosis: Unsteadiness on feet (R26.81);Other abnormalities of gait and mobility (R26.89);Muscle weakness (generalized) (M62.81)    Time: 2263-3354 PT Time Calculation (min) (ACUTE ONLY): 21 min   Charges:   PT Evaluation $PT Eval Moderate Complexity: 1 Mod PT Treatments $Therapeutic Activity: 8-22 mins   PT G Codes:        11:53 AM, 06-Sep-2017 Lonell Grandchild, MPT Physical Therapist with [facility] [phone] office [phone] mobile phone

## 2017-08-29 NOTE — Progress Notes (Signed)
PROGRESS NOTE    Andrea [name]  NFA:[id number] DOB: Jul 18, 1929 DOA: 08/26/2017 PCP: Unk Pinto, MD    Brief Narrative:  Andrea [name] is a 83 y.o. female with medical history significant of aortic insufficiency, aortic regurg, COPD, DJD, HLD brought in by her daughters for concern of shortness of breath and weakness. Patient reports that 3-4 days ago she began having symptoms but her daughter says that her symptoms actually began a week ago.  Patient was taken off lasix about 2-3 weeks ago.  Per her daughter this was due to decreased kidney function.  Since that time patient's legs have increased in swelling. Patient denies fevers, chills.  No palpitations, no diarrhea but patient daughter reports she is being treated for C. Diff and that this is her last day of treatment.   ED Course: Patient was seen by emergency room physician and found to have large bilateral pleural effusions and questionable volume overload.  She was given a 40 mg dose of IV Lasix and TRH was asked for admission for further evaluation and treatment of her bilateral pleural effusions causing acute hypoxic respiratory failure.  Patient was begun on diuresis, her Lasix dose from home was doubled.  Serial chest x-rays were obtained to monitor the size of the pleural effusions.  Echocardiogram was obtained which showed grade 2 diastolic dysfunction with an ejection fraction of 60-65%.  After 3 days of attempting diuresis to decrease the size of pleural effusions patient's x-ray on 08/28/2017 showed no improvement in size of pleural effusion.  Thoracentesis was ordered as well as numerous laboratory panels to further evaluate type of effusion.   Assessment & Plan:   Principal Problem:   Pleural effusion Active Problems:   Essential hypertension   COPD with emphysema (HCC)   Hyperlipidemia   Hypothyroidism   Chronic diastolic CHF (congestive heart failure) (HCC)   AF (paroxysmal atrial fibrillation) (HCC)  Cellulitis of right lower extremity   Bilateral pleural effusions -Repeat  chest x-ray showing no real change in size of pleural effusions -Thoracentesis today (1L of fluid obtained and sent for analysis) -Wean oxygen as tolerated -Repeat dose of 40 mg of Lasix this afternoon  Chronic diastolic congestive heart failure -Echocardiogram showing grade 2 diastolic dysfunction with an EF of 60-65% -Diuresis with Lasix 40 mg twice daily -Strict I's and O's (net - 2.8 L since admission) -1500 mL fluid restriction - Daily BMP -Creatinine is stable  Leukocytosis -Repeat CBC D in a.m. -No signs of infectious etiology at this time  Hypomagnesemia -Medium level from this morning pending  Essential hypertension -Continue Cardizem and metoprolol  Questionable diagnosis of C. difficile -C. difficile PCR sent on 1121 2018+ -Patient previously on Flagyl but will need a full 10-day course -Upon discussing with patient's daughter patient is actually only received today will be day 9 of 10 days of antibiotics -Discontinue antibiotics tomorrow after completing a full 10-day treatment course  Hypothyroidism -We will continue levothyroxine  Paroxysmal atrial fibrillation -Restart Eliquis -Continue metoprolol and Cardizem at lower doses that were initiated yesterday  Cellulitis of the right lower extremity -Wound care consult  Malnutrition - Ensure 3 times daily     DVT prophylaxis: Eliquis Code Status: Full code Family Communication: No family is bedside Disposition Plan: Thoracentesis today; would like to see if pleural effusions reaccumulate in the next 24 hours and may be able to discharge tomorrow on 08/30/2017   Consultants:   None  Procedures:   None  Antimicrobials:   Flagyl-  started outpatient on 11/2(needs 1 more day for a total of 10 days)   Subjective: She is seen after thoracentesis.  1 L of fluid was obtained from thoracentesis.  Patient is complaining  about low-salt diet.  She is in agreement to staying till tomorrow for analysis of her thoracentesis   And repeat x-ray.  Objective: Vitals:   08/28/17 2230 08/29/17 0156 08/29/17 0420 08/29/17 0617  BP: 134/73 129/61 (!) 155/98 135/74  Pulse: (!) 109 96 62   Resp: 18  20   Temp: 98.1 F (36.7 C)  97.8 F (36.6 C)   TempSrc: Oral  Oral   SpO2: 98% 97% 95%   Weight:   51.3 kg (113 lb 1.5 oz)   Height:        Intake/Output Summary (Last 24 hours) at 08/29/2017 0734 Last data filed at 08/29/2017 0011 Gross per 24 hour  Intake 220 ml  Output 1050 ml  Net -830 ml   Filed Weights   08/27/17 0457 08/28/17 0339 08/29/17 0420  Weight: 51.6 kg (113 lb 12.1 oz) 51.1 kg (112 lb 10.5 oz) 51.3 kg (113 lb 1.5 oz)    Examination:  General exam: Appears calm and comfortable, elderly cachectic female Respiratory system: Decreased breath sounds in right middle and lower lung zones, better air movement in left lung fields with few basilar rales and bandage over site of thoracentesis Cardiovascular system: S1 & S2 heard, RRR. No JVD, murmurs, rubs, gallops or clicks.  2+ pitting edema to mid shins Gastrointestinal system: Abdomen is nondistended, soft and nontender. No organomegaly or masses felt. Normal bowel sounds heard. Central nervous system: Alert and oriented. No focal neurological deficits. Extremities: Symmetric 5 x 5 power. Skin: Bandage over chronic skin ulcer on right lower extremity psychiatry: Judgement and insight appear normal. Mood & affect appropriate.     Data Reviewed: I have personally reviewed following labs and imaging studies  CBC: Recent Labs  Lab 08/26/17 1210 08/27/17 0615 08/29/17 0432  WBC 11.2* 9.4 16.1*  NEUTROABS 8.8*  --   --   HGB 11.7* 10.3* 11.1*  HCT 39.3 35.1* 38.0  MCV 94.7 97.2 97.9  PLT 384 328 161   Basic Metabolic Panel: Recent Labs  Lab 08/26/17 1210 08/27/17 0615 08/28/17 0524 08/29/17 0432  NA 134* 139 138 139  K 4.0 4.5 4.0 4.0    CL 95* 98* 93* 90*  CO2 31 37* 41* 39*  GLUCOSE 163* 102* 109* 119*  BUN _0 25*  CREATININE 1.04* 1.05* 1.00 0.99  CALCIUM 8.8* 8.0* 8.0* 8.5*  MG  --   --  1.6*  --    GFR: Estimated Creatinine Clearance: 33 mL/min (by C-G formula based on SCr of 0.99 mg/dL). Liver Function Tests: Recent Labs  Lab 08/26/17 1210 08/27/17 0615 08/29/17 0432  AST 18 13*  --   ALT 9* 8*  --   ALKPHOS 78 62  --   BILITOT 0.5 0.3  --   PROT 7.6 5.9* 6.8  ALBUMIN 3.0* 2.5* 2.8*   No results for input(s): LIPASE, AMYLASE in the last 168 hours. No results for input(s): AMMONIA in the last 168 hours. Coagulation Profile: No results for input(s): INR, PROTIME in the last 168 hours. Cardiac Enzymes: Recent Labs  Lab 08/26/17 1210  TROPONINI <0.03   BNP (last 3 results) No results for input(s): PROBNP in the last 8760 hours. HbA1C: No results for input(s): HGBA1C in the last 72 hours. CBG: No results for input(s):  GLUCAP in the last 168 hours. Lipid Profile: No results for input(s): CHOL, HDL, LDLCALC, TRIG, CHOLHDL, LDLDIRECT in the last 72 hours. Thyroid Function Tests: No results for input(s): TSH, T4TOTAL, FREET4, T3FREE, THYROIDAB in the last 72 hours. Anemia Panel: No results for input(s): VITAMINB12, FOLATE, FERRITIN, TIBC, IRON, RETICCTPCT in the last 72 hours. Sepsis Labs: No results for input(s): PROCALCITON, LATICACIDVEN in the last 168 hours.  No results found for this or any previous visit (from the past 240 hour(s)).       Radiology Studies: Dg Chest Port 1 View  Result Date: 08/28/2017 CLINICAL DATA:  Hypoxemia, bilateral pleural effusions EXAM: PORTABLE CHEST 1 VIEW COMPARISON:  08/27/2017 FINDINGS: Moderate to large bilateral pleural effusions. Diffuse bilateral airspace disease again noted. No real change since prior study. Cardiomegaly. IMPRESSION: Severe diffuse bilateral airspace disease and a large effusions. No real change. Electronically Signed   By: Rolm Baptise M.D.   On: 08/28/2017 10:27   Dg Chest Port 1 View  Result Date: 08/27/2017 CLINICAL DATA:  Pleural effusion. EXAM: PORTABLE CHEST 1 VIEW COMPARISON:  08/26/2017 FINDINGS: Moderate bilateral pleural effusions. Diffuse bilateral airspace disease, likely edema/ CHF. Cardiomegaly. Slight interval worsening edema since prior study. IMPRESSION: Worsening aeration of the lungs compatible with worsening edema/ CHF. Moderate bilateral effusions are stable. Electronically Signed   By: Rolm Baptise M.D.   On: 08/27/2017 13:19        Scheduled Meds: . ALPRAZolam  0.125-0.25 mg Oral QHS  . diltiazem  30 mg Oral Q8H  . enoxaparin (LOVENOX) injection  50 mg Subcutaneous Q12H  . furosemide  40 mg Oral Daily  . levothyroxine  50 mcg Oral BH-q7a  . mouth rinse  15 mL Mouth Rinse BID  . metoprolol tartrate  12.5 mg Oral BID  . metroNIDAZOLE  500 mg Oral BID   Continuous Infusions:   LOS: 3 days    Time spent: 25 minutes    Loretha Stapler, MD [facility] Pager [phone]  If 7PM-7AM, please contact night-coverage [email or web address] Password [facility] 08/29/2017, 7:34 AM

## 2017-08-29 NOTE — Progress Notes (Signed)
When VS checked at 0420, HR 140s per [place], NT. Patient anxious, stating that she had a nightmare that she died for the second night. Patient resting in bed, but HR as much as 140s, very irregular. Andrea Bowens, MD notified. Tele applied, Metoprolol IV given, and Mag IV given per new orders. HR improved to as much as 120s with resting. Cardizem given PO per orders.

## 2017-08-29 NOTE — Care Management Note (Signed)
Case Management Note  Patient Details  Name: Andrea [name] MRN: [id number] Date of Birth: 1928/11/08  Subjective/Objective: Adm from home with pleural effusion. Ind with ADL's, has cane if needed. Has PCP, daughter drives her to appointments. She has supplemental night time oxygen provided by [facility].                     Action/Plan:Recommended for [facility] PT. Would like [facility], she has had them in the past. Vaughan Basta of [facility] notified and will obtain orders when available.    Expected Discharge Date:  08/29/17               Expected Discharge Plan:  [place]  In-House Referral:     Discharge planning Services  CM Consult  Post Acute Care Choice:  Home Health Choice offered to:  Patient  DME Arranged:    DME Agency:  [place]:  PT [facility] Agency:     Status of Service:  In process, will continue to follow  If discussed at Long Length of Stay Meetings, dates discussed:    Additional Comments:  Aristidis Talerico, Chauncey [place], RN 08/29/2017, 3:57 PM

## 2017-08-29 NOTE — Plan of Care (Signed)
  Acute Rehab PT Goals(only PT should resolve) Pt Will Go Supine/Side To Sit 08/29/2017 1155 - Progressing by Lonell Grandchild, PT Flowsheets Taken 08/29/2017 1155  Pt will go Supine/Side to Sit Independently Pt Will Go Sit To Supine/Side 08/29/2017 1155 - Progressing by Lonell Grandchild, PT Flowsheets Taken 08/29/2017 1155  Pt will go Sit to Supine/Side Independently Patient Will Transfer Sit To/From Stand 08/29/2017 1155 - Progressing by Lonell Grandchild, PT Flowsheets Taken 08/29/2017 1155  Patient will transfer sit to/from stand with supervision Pt Will Transfer Bed To Chair/Chair To Bed 08/29/2017 1155 - Progressing by Lonell Grandchild, PT Flowsheets Taken 08/29/2017 1155  Pt will Transfer Bed to Chair/Chair to Bed with supervision Pt Will Ambulate 08/29/2017 1155 - Progressing by Lonell Grandchild, PT Flowsheets Taken 08/29/2017 1155  Pt will Ambulate 50 feet;with supervision;with rolling walker  11:56 AM, 08/29/17 Lonell Grandchild, MPT Physical Therapist with [facility] [phone] office [phone] mobile phone

## 2017-08-29 NOTE — Progress Notes (Signed)
Thoracentesis complete no signs of distress, 1L amber colored pleural fluid removed.

## 2017-08-30 ENCOUNTER — Inpatient Hospital Stay (HOSPITAL_COMMUNITY): Payer: Medicare Other

## 2017-08-30 ENCOUNTER — Ambulatory Visit: Payer: Medicare Other | Admitting: General Surgery

## 2017-08-30 DIAGNOSIS — J90 Pleural effusion, not elsewhere classified: Secondary | ICD-10-CM

## 2017-08-30 DIAGNOSIS — I4891 Unspecified atrial fibrillation: Secondary | ICD-10-CM

## 2017-08-30 DIAGNOSIS — J9621 Acute and chronic respiratory failure with hypoxia: Secondary | ICD-10-CM

## 2017-08-30 LAB — CBC WITH DIFFERENTIAL/PLATELET
BASOS PCT: 0 %
Basophils Absolute: 0 10*3/uL (ref 0.0–0.1)
EOS ABS: 0.1 10*3/uL (ref 0.0–0.7)
Eosinophils Relative: 1 %
HEMATOCRIT: 37.5 % (ref 36.0–46.0)
Hemoglobin: 10.8 g/dL — ABNORMAL LOW (ref 12.0–15.0)
Lymphocytes Relative: 16 %
Lymphs Abs: 1.6 10*3/uL (ref 0.7–4.0)
MCH: 28.1 pg (ref 26.0–34.0)
MCHC: 28.8 g/dL — AB (ref 30.0–36.0)
MCV: 97.7 fL (ref 78.0–100.0)
MONO ABS: 0.9 10*3/uL (ref 0.1–1.0)
Monocytes Relative: 8 %
NEUTROS ABS: 7.9 10*3/uL — AB (ref 1.7–7.7)
NEUTROS PCT: 75 %
Platelets: 324 10*3/uL (ref 150–400)
RBC: 3.84 MIL/uL — ABNORMAL LOW (ref 3.87–5.11)
RDW: 13.7 % (ref 11.5–15.5)
WBC: 10.5 10*3/uL (ref 4.0–10.5)

## 2017-08-30 LAB — BASIC METABOLIC PANEL
Anion gap: 6 (ref 5–15)
BUN: 23 mg/dL — AB (ref 6–20)
CALCIUM: 8.2 mg/dL — AB (ref 8.9–10.3)
CHLORIDE: 88 mmol/L — AB (ref 101–111)
CO2: 43 mmol/L — AB (ref 22–32)
CREATININE: 0.86 mg/dL (ref 0.44–1.00)
GFR calc non Af Amer: 59 mL/min — ABNORMAL LOW (ref >60)
Glucose, Bld: 94 mg/dL (ref 65–99)
Potassium: 4.2 mmol/L (ref 3.5–5.1)
Sodium: 137 mmol/L (ref 135–145)

## 2017-08-30 MED ORDER — APIXABAN 2.5 MG PO TABS
2.5000 mg | ORAL_TABLET | Freq: Two times a day (BID) | ORAL | Status: DC
  Administered 2017-08-30 – 2017-09-01 (×5): 2.5 mg via ORAL
  Filled 2017-08-30 (×5): qty 1

## 2017-08-30 MED ORDER — DILTIAZEM HCL 60 MG PO TABS
60.0000 mg | ORAL_TABLET | Freq: Three times a day (TID) | ORAL | Status: DC
  Administered 2017-08-30 – 2017-09-01 (×7): 60 mg via ORAL
  Filled 2017-08-30 (×7): qty 1

## 2017-08-30 MED ORDER — METOPROLOL TARTRATE 5 MG/5ML IV SOLN
5.0000 mg | Freq: Once | INTRAVENOUS | Status: DC
  Filled 2017-08-30 (×2): qty 5

## 2017-08-30 MED ORDER — FUROSEMIDE 10 MG/ML IJ SOLN
40.0000 mg | Freq: Two times a day (BID) | INTRAMUSCULAR | Status: DC
  Administered 2017-08-30 – 2017-08-31 (×3): 40 mg via INTRAVENOUS
  Filled 2017-08-30 (×3): qty 4

## 2017-08-30 MED ORDER — METOPROLOL TARTRATE 25 MG PO TABS
25.0000 mg | ORAL_TABLET | Freq: Two times a day (BID) | ORAL | Status: DC
Start: 2017-08-30 — End: 2017-09-01
  Administered 2017-08-30 – 2017-09-01 (×4): 25 mg via ORAL
  Filled 2017-08-30 (×4): qty 1

## 2017-08-30 NOTE — Progress Notes (Signed)
PROGRESS NOTE                                                                                                                                                                                                             Patient Demographics:    Andrea [name], is a 83 y.o. female, DOB - 1929-06-07, BTD:[id number]  Admit date - 08/26/2017   Admitting Physician Eber Jones, MD  Outpatient Primary MD for the patient is Unk Pinto, MD  LOS - 4  Outpatient Specialists:None  Chief Complaint  Patient presents with  . Shortness of Breath       Brief Narrative  83 year old female with history of aortic insufficiency/regurgitation, COPD, DJD, hyperlipidemia presented with increasing shortness of breath for past 3-4 days. Patient was reportedly taken off Lasix about 2-3 weeks back due to worsening renal function. Since then the daughters have noticed her leg to have increasingly swollen. She is also being treated for C. Difficile As outpatient. In the ED she was found to have large bilateral pleural effusion with volume overload on chest x-ray. Patient given IV Lasix and admitted to hospitalist service. Despite IV diuresis she still had significant pleural effusion. 2-D echo showed normal EF with grade 2 diastolic dysfunction. Patient underwent Left thoracentesis on 12/3 with 1 L Amber-colored fluid removed.   Subjective:   She denies any shortness of breath Or chest discomfort. Denies any diarrhea. Heart rate found to be in 120s this morning.   Assessment  & Plan :    Principal Problem: Bilateral pleural effusion Acute on chronic diastolic CHF (HCC) Status post left thoracentesis , Transudative. Follow chest x-ray shows improvement in pleural effusion but still has Pulmonary vascular congestion. Patient requiring 2-3L oxygen via nasal cannula. Will place her on IV Lasix 40 mg twice a day. Monitor strict I/O and daily  weight. If symptoms persistent or unimproved will consult cardiology.  Active Problems: Atrial fibrillation with RVR heart rate going up to 120s. Increased her Cardizem and metoprolol dose. Resume eliquis.     Essential hypertension Continue Cardizem and metoprolol.  History of C. Difficile Patient on Flagyl as outpatient (completes 10 day course of antibiotics today). No diarrhea noted during hospital stay.  Hypothyroidism Continue Synthroid.   Protein calorie malnutrition, severe Continue supplemental         Code Status :  Full code  Family Communication  : None at bedside  Disposition Plan  :Home with home health once Symptoms improve  Barriers For Discharge : Active symptoms  Consults  :  None  Procedures  : Thoracentesis  DVT Prophylaxis  :  eliquis  Lab Results  Component Value Date   PLT 324 08/30/2017    Antibiotics  :    Anti-infectives (From admission, onward)   Start     Dose/Rate Route Frequency Ordered Stop   08/27/17 1000  metroNIDAZOLE (FLAGYL) tablet 500 mg     500 mg Oral 2 times daily 08/27/17 0959          Objective:   Vitals:   08/30/17 0513 08/30/17 0819 08/30/17 0925 08/30/17 1400  BP: (!) 118/56 (!) 146/74 (!) 131/56 (!) 110/59  Pulse: (!) 105  (!) 122 (!) 107  Resp: 20     Temp: (!) 97.4 F (36.3 C)   97.7 F (36.5 C)  TempSrc: Oral   Oral  SpO2: 97%   97%  Weight: 47.4 kg (104 lb 8 oz)     Height:        Wt Readings from Last 3 Encounters:  08/30/17 47.4 kg (104 lb 8 oz)  08/16/17 48.4 kg (106 lb 12.8 oz)  08/16/17 47.6 kg (105 lb)     Intake/Output Summary (Last 24 hours) at 08/30/2017 1501 Last data filed at 08/30/2017 1400 Gross per 24 hour  Intake 410 ml  Output 2000 ml  Net -1590 ml     Physical Exam  Gen: Elderly thin built female not in this HEENT:  moist mucosa, supple neck Chest: diminished bibasilar breath sounds with fine crackles CVS: S1 and S2 irregularly irregular, systolic murmur 3/6 GI:  soft, NT, ND,  Musculoskeletal: warm, no edema     Data Review:    CBC Recent Labs  Lab 08/26/17 1210 08/27/17 0615 08/29/17 0432 08/30/17 0343  WBC 11.2* 9.4 16.1* 10.5  HGB 11.7* 10.3* 11.1* 10.8*  HCT 39.3 35.1* 38.0 37.5  PLT 384 328 376 324  MCV 94.7 97.2 97.9 97.7  MCH 28.2 28.5 28.6 28.1  MCHC 29.8* 29.3* 29.2* 28.8*  RDW 13.3 13.5 13.7 13.7  LYMPHSABS 1.4  --   --  1.6  MONOABS 0.9  --   --  0.9  EOSABS 0.1  --   --  0.1  BASOSABS 0.0  --   --  0.0    Chemistries  Recent Labs  Lab 08/26/17 1210 08/27/17 0615 08/28/17 0524 08/29/17 0432 08/30/17 0343  NA 134* 139 138 139 137  K 4.0 4.5 4.0 4.0 4.2  CL 95* 98* 93* 90* 88*  CO2 31 37* 41* 39* 43*  GLUCOSE 163* 102* 109* 119* 94  BUN _0 25* 23*  CREATININE 1.04* 1.05* 1.00 0.99 0.86  CALCIUM 8.8* 8.0* 8.0* 8.5* 8.2*  MG  --   --  1.6*  --   --   AST 18 13*  --   --   --   ALT 9* 8*  --   --   --   ALKPHOS 78 62  --   --   --   BILITOT 0.5 0.3  --   --   --    ------------------------------------------------------------------------------------------------------------------ No results for input(s): CHOL, HDL, LDLCALC, TRIG, CHOLHDL, LDLDIRECT in the last 72 hours.  Lab Results  Component Value Date   HGBA1C 5.4 01/24/2017   ------------------------------------------------------------------------------------------------------------------ No results for input(s): TSH, T4TOTAL, T3FREE,  THYROIDAB in the last 72 hours.  Invalid input(s): FREET3 ------------------------------------------------------------------------------------------------------------------ No results for input(s): VITAMINB12, FOLATE, FERRITIN, TIBC, IRON, RETICCTPCT in the last 72 hours.  Coagulation profile No results for input(s): INR, PROTIME in the last 168 hours.  No results for input(s): DDIMER in the last 72 hours.  Cardiac Enzymes Recent Labs  Lab 08/26/17 1210  TROPONINI <0.03    ------------------------------------------------------------------------------------------------------------------    Component Value Date/Time   BNP 335.0 (H) 08/26/2017 1210   BNP 271.5 (H) 06/04/2014 1003    Inpatient Medications  Scheduled Meds: . ALPRAZolam  0.125-0.25 mg Oral QHS  . apixaban  2.5 mg Oral BID  . diltiazem  60 mg Oral Q8H  . furosemide  40 mg Intravenous Q12H  . levothyroxine  50 mcg Oral BH-q7a  . mouth rinse  15 mL Mouth Rinse BID  . metoprolol tartrate  5 mg Intravenous Once  . metoprolol tartrate  25 mg Oral BID  . metroNIDAZOLE  500 mg Oral BID  . mupirocin ointment   Topical BID   Continuous Infusions: PRN Meds:.acetaminophen **OR** acetaminophen, ondansetron **OR** ondansetron (ZOFRAN) IV, polyethylene glycol, traMADol  Micro Results Recent Results (from the past 240 hour(s))  Gram stain     Status: None   Collection Time: 08/29/17 10:55 AM  Result Value Ref Range Status   Specimen Description PLEURAL  Final   Special Requests NONE  Final   Gram Stain   Final    NO ORGANISMS SEEN WBC PRESENT,BOTH PMN AND MONONUCLEAR CYTOSPIN SMEAR    Report Status 08/29/2017 FINAL  Final  Culture, body fluid-bottle     Status: None (Preliminary result)   Collection Time: 08/29/17 10:55 AM  Result Value Ref Range Status   Specimen Description PLEURAL COLLECTED BY DOCTOR  Final   Special Requests BOTTLES DRAWN AEROBIC AND ANAEROBIC 10 CC EACH  Final   Culture NO GROWTH < 24 HOURS  Final   Report Status PENDING  Incomplete    Radiology Reports Dg Chest 1 View  Result Date: 08/29/2017 INDICATION: Pleural effusions resistant to diuresis. EXAM: ULTRASOUND GUIDED LEFT THORACENTESIS MEDICATIONS: None. COMPLICATIONS: None immediate. PROCEDURE: An ultrasound guided thoracentesis was thoroughly discussed with the patient and questions answered. The benefits, risks, alternatives and complications were also discussed. The patient is at increased bleeding risk due to  Eliquis use and we had a lengthy discussion about this; the patient's husband was also called on the telephone. The patient understands and wishes to proceed with the procedure. Written consent was obtained. Ultrasound was performed to localize and mark an adequate pocket of fluid in the left chest. The area was then prepped and draped in the normal sterile fashion. 1% Lidocaine was used for local anesthesia. Under ultrasound guidance a 19 gauge, 7-cm, Yueh catheter was introduced. Thoracentesis was performed until the available fluid was drained. I scanned the left chest and there was no evidence of bleeding. The catheter was removed and a dressing applied. FINDINGS: A total of approximately 1 L of amber fluid was removed. Samples were sent to the laboratory as requested by the clinical team. A chest x-ray was obtained and shows significant decrease in left pleural effusion, with small volume fluid remaining. No visible pneumothorax. There is a moderate right pleural effusion. Cardiomegaly and vascular congestion. IMPRESSION: 1. Successful ultrasound guided left thoracentesis yielding 1 L of pleural fluid. 2. Minimal fluid remaining on the left. Right pleural effusion is moderate. Electronically Signed   By: Monte Fantasia M.D.   On: 08/29/2017 11:35  Dg Chest 2 View  Result Date: 08/26/2017 CLINICAL DATA:  Patient having increased SOB over the past few days. Patient is on oxygen at home and states that she usually only wears it at night but has been wearing it all day since having SOB. Hx of pna, htn-controlled with medication, COPD. Ex-smoker. EXAM: CHEST  2 VIEW COMPARISON:  06/28/2017 FINDINGS: The lungs are hyperinflated likely secondary to COPD. There are moderate bilateral pleural effusions with bibasilar atelectasis. There is bilateral diffuse interstitial thickening. There is no pneumothorax. Stable cardiomediastinal silhouette. There is no acute osseous abnormality. There is severe osteoarthritis  of the right glenohumeral joint. There is mild osteoarthritis of the left glenohumeral joint. IMPRESSION: 1. Findings consistent with CHF. Electronically Signed   By: Kathreen Devoid   On: 08/26/2017 13:36   Ct Abdomen Pelvis W Contrast  Result Date: 08/19/2017 CLINICAL DATA:  Diarrhea for 1 week. EXAM: CT ABDOMEN AND PELVIS WITH CONTRAST TECHNIQUE: Multidetector CT imaging of the abdomen and pelvis was performed using the standard protocol following bolus administration of intravenous contrast. CONTRAST:  80 ml ISOVUE-300 IOPAMIDOL (ISOVUE-300) INJECTION 61% COMPARISON:  CT abdomen and pelvis 04/08/2017. FINDINGS: Lower chest: Small to moderate bilateral pleural effusions are present, larger on the left, and increased since the prior CT. Lung bases demonstrate emphysematous disease. Heart size is enlarged. Calcific coronary artery disease is noted. Compressive atelectasis in lung bases is present Hepatobiliary: No focal liver abnormality is seen. 1 cm hypervascular lesion in the anterior left hepatic lobe is better visualized on the prior CT. No gallstones, gallbladder wall thickening, or biliary dilatation. Pancreas: Unremarkable. No pancreatic ductal dilatation or surrounding inflammatory changes. Spleen: Normal in size without focal abnormality. Adrenals/Urinary Tract: Multiple low attenuating lesions in the right kidney are likely cysts and unchanged. No hydronephrosis. The right kidney is ptotic, unchanged. Urinary bladder appears normal. Adrenal glands are unremarkable. Stomach/Bowel: Extensive diverticulosis without diverticulitis is worst in the sigmoid. The colon otherwise appears normal. Stomach and small bowel appear normal. No evidence of appendicitis. Vascular/Lymphatic: Aortic atherosclerosis. No enlarged abdominal or pelvic lymph nodes. Reproductive: Status post hysterectomy. No adnexal masses. Other: No ascites. Musculoskeletal: Severe convex right scoliosis noted. No lytic or sclerotic lesion. No  acute abnormality. IMPRESSION: No acute abnormality abdomen or pelvis. Small to moderate bilateral pleural effusions, larger on the left, increased since the prior CT. Diverticulosis without diverticulitis. Calcific aortic and coronary atherosclerosis. Electronically Signed   By: Inge Rise M.D.   On: 08/19/2017 18:46   Dg Chest Port 1 View  Result Date: 08/30/2017 CLINICAL DATA:  COPD.  Pleural effusion. EXAM: PORTABLE CHEST 1 VIEW COMPARISON:  08/29/2017. FINDINGS: Cardiomegaly with pulmonary venous congestion and bilateral interstitial prominence with bilateral pleural effusions again noted. Findings consistent with CHF. No significant change from prior study of 08/29/2017. No evidence of pneumothorax status post recent thoracentesis. IMPRESSION: 1. Persistent cardiomegaly with pulmonary venous congestion, bilateral interstitial prominence and bilateral pleural effusions again noted without significant change. Findings consistent CHF. 2. No evidence of progression of pleural effusions. No pneumothorax status post recent left thoracentesis. Electronically Signed   By: Marcello Moores  Register   On: 08/30/2017 08:43   Dg Chest Port 1 View  Result Date: 08/28/2017 CLINICAL DATA:  Hypoxemia, bilateral pleural effusions EXAM: PORTABLE CHEST 1 VIEW COMPARISON:  08/27/2017 FINDINGS: Moderate to large bilateral pleural effusions. Diffuse bilateral airspace disease again noted. No real change since prior study. Cardiomegaly. IMPRESSION: Severe diffuse bilateral airspace disease and a large effusions. No real change. Electronically Signed  By: Rolm Baptise M.D.   On: 08/28/2017 10:27   Dg Chest Port 1 View  Result Date: 08/27/2017 CLINICAL DATA:  Pleural effusion. EXAM: PORTABLE CHEST 1 VIEW COMPARISON:  08/26/2017 FINDINGS: Moderate bilateral pleural effusions. Diffuse bilateral airspace disease, likely edema/ CHF. Cardiomegaly. Slight interval worsening edema since prior study. IMPRESSION: Worsening aeration  of the lungs compatible with worsening edema/ CHF. Moderate bilateral effusions are stable. Electronically Signed   By: Rolm Baptise M.D.   On: 08/27/2017 13:19   US Thoracentesis Asp Pleural Space W/img Guide  Result Date: 08/29/2017 INDICATION: Pleural effusions resistant to diuresis. EXAM: ULTRASOUND GUIDED LEFT THORACENTESIS MEDICATIONS: None. COMPLICATIONS: None immediate. PROCEDURE: An ultrasound guided thoracentesis was thoroughly discussed with the patient and questions answered. The benefits, risks, alternatives and complications were also discussed. The patient is at increased bleeding risk due to Eliquis use and we had a lengthy discussion about this; the patient's husband was also called on the telephone. The patient understands and wishes to proceed with the procedure. Written consent was obtained. Ultrasound was performed to localize and mark an adequate pocket of fluid in the left chest. The area was then prepped and draped in the normal sterile fashion. 1% Lidocaine was used for local anesthesia. Under ultrasound guidance a 19 gauge, 7-cm, Yueh catheter was introduced. Thoracentesis was performed until the available fluid was drained. I scanned the left chest and there was no evidence of bleeding. The catheter was removed and a dressing applied. FINDINGS: A total of approximately 1 L of amber fluid was removed. Samples were sent to the laboratory as requested by the clinical team. A chest x-ray was obtained and shows significant decrease in left pleural effusion, with small volume fluid remaining. No visible pneumothorax. There is a moderate right pleural effusion. Cardiomegaly and vascular congestion. IMPRESSION: 1. Successful ultrasound guided left thoracentesis yielding 1 L of pleural fluid. 2. Minimal fluid remaining on the left. Right pleural effusion is moderate. Electronically Signed   By: Monte Fantasia M.D.   On: 08/29/2017 11:35    Time Spent in minutes  25   Nandini Bogdanski M.D  on 08/30/2017 at 3:01 PM  Between 7am to 7pm - Pager - [phone]  After 7pm go to [email or web address] - password [facility]  [facility] -  Office  [phone]

## 2017-08-30 NOTE — Progress Notes (Signed)
Physical Therapy Treatment Patient Details Name: Andrea [name] MRN: [id number] DOB: [birth year] Today's Date: 08/30/2017    History of Present Illness Andrea [name] is a 83 y.o. female with medical history significant of aortic insufficiency, aortic regurg, COPD, DJD, HLD brought in by her daughters for concern of shortness of breath and weakness. Patient reports that 3-4 days ago she began having symptoms but her daughter says that her symptoms actually began a week ago.  Patient was taken off lasix about 2-3 weeks ago.  Per her daughter this was due to decreased kidney function.  Since that time patient's legs have increased in swelling. Patient denies fevers, chills.  No palpitations, no diarrhea but patient daughter reports she is being treated for C. Diff and that this is her last day of treatment.     PT Comments    Patient presents up in chair (assisted by nursing staff), agreeable for therapy and demonstrates increased endurance for gait training using RW without loss of balance.  Patient requested to go back to bed after therapy secondary to c/o fatigue.  Patient will benefit from continued physical therapy in hospital and recommended venue below to increase strength, balance, endurance for safe ADLs and gait.    Follow Up Recommendations  Home health PT;Supervision for mobility/OOB     Equipment Recommendations  None recommended by PT    Recommendations for Other Services       Precautions / Restrictions Precautions Precautions: Fall Restrictions Weight Bearing Restrictions: No    Mobility  Bed Mobility Overal bed mobility: Needs Assistance Bed Mobility: Supine to Sit;Sit to Supine     Supine to sit: Supervision Sit to supine: Supervision      Transfers Overall transfer level: Needs assistance Equipment used: 1 person hand held assist;Rolling walker (2 wheeled) Transfers: Sit to/from [facility] Sit to Stand: Min guard Stand pivot transfers: Min  guard       General transfer comment: unsteady with tendency to reach for nearby objects for support when not using RW  Ambulation/Gait Ambulation/Gait assistance: Min guard Ambulation Distance (Feet): 40 Feet Assistive device: Rolling walker (2 wheeled) Gait Pattern/deviations: Decreased step length - right;Decreased step length - left;Decreased stride length   Gait velocity interpretation: Below normal speed for age/gender General Gait Details: demostrates slightly labored slow cadence without loss of balance, limited secondary to fatigue/mild SOB   Stairs            Wheelchair Mobility    Modified Rankin (Stroke Patients Only)       Balance Overall balance assessment: Needs assistance Sitting-balance support: No upper extremity supported;Feet supported Sitting balance-Leahy Scale: Good     Standing balance support: Bilateral upper extremity supported;During functional activity Standing balance-Leahy Scale: Fair                              Cognition Arousal/Alertness: Awake/alert Behavior During Therapy: WFL for tasks assessed/performed Overall Cognitive Status: Within Functional Limits for tasks assessed                                        Exercises General Exercises - Lower Extremity Ankle Circles/Pumps: Seated;AROM;Strengthening;Both;10 reps Long Arc Quad: Seated;AROM;Strengthening;Both;10 reps Hip Flexion/Marching: AROM;Seated;Strengthening;Both;10 reps    General Comments        Pertinent Vitals/Pain Pain Assessment: No/denies pain    Home Living  Prior Function            PT Goals (current goals can now be found in the care plan section) Acute Rehab PT Goals Patient Stated Goal: return home PT Goal Formulation: With patient Time For Goal Achievement: 09/02/17 Potential to Achieve Goals: Good Progress towards PT goals: Progressing toward goals    Frequency    Min  3X/week      PT Plan Current plan remains appropriate    Co-evaluation              AM-PAC PT "6 Clicks" Daily Activity  Outcome Measure  Difficulty turning over in bed (including adjusting bedclothes, sheets and blankets)?: None Difficulty moving from lying on back to sitting on the side of the bed? : None Difficulty sitting down on and standing up from a chair with arms (e.g., wheelchair, bedside commode, etc,.)?: A Little Help needed moving to and from a bed to chair (including a wheelchair)?: A Little Help needed walking in hospital room?: A Little Help needed climbing 3-5 steps with a railing? : A Little 6 Click Score: 20    End of Session   Activity Tolerance: Patient tolerated treatment well;Patient limited by fatigue Patient left: in bed;with call bell/phone within reach Nurse Communication: Mobility status PT Visit Diagnosis: Unsteadiness on feet (R26.81);Other abnormalities of gait and mobility (R26.89);Muscle weakness (generalized) (M62.81)     Time: 1001-1030 PT Time Calculation (min) (ACUTE ONLY): 29 min  Charges:  $Gait Training: 8-22 mins $Therapeutic Exercise: 8-22 mins                    G Codes:       1:32 PM, 2017/09/20 Andrea [name], MPT Physical Therapist with [facility] 336 [phone] office [phone] mobile phone

## 2017-08-30 NOTE — Consult Note (Addendum)
   [facility] CM Inpatient Consult   08/30/2017  Andrea [name] 05/02/1929 [id number]   Patient screened for potential [facility] services. Patient is on the [facility] registry as a benefit of their [employer]. Patient has a diagnosis of COPD and has been hospitalized 3 times in the last 6 months. Met with the patient regarding the benefits of [facility] services. Explained that [facility] is a covered benefit of patient's [employer]. Patient stated she has 4 living daughters who assist her with transportation and medication organization and denies needs for [facility] services.  Reviewed information for [facility] and a brochure was provided with contact information for future reference.  Explained that [facility] does not interfere with or replace any services arranged by the inpatient care management staff.  Patient declined services with [facility] at this time.  For questions or concerns please contact:  Johny Pitstick RN, [facility] Liaison  [phone]) Business Mobile [phone]) Toll free office

## 2017-08-30 NOTE — Care Management Important Message (Signed)
Important Message  Patient Details  Name: Andrea [name] MRN: [id number] Date of Birth: [birth year]   Medicare Important Message Given:  Yes    Sherald Barge, RN 08/30/2017, 1:05 PM

## 2017-08-31 DIAGNOSIS — I5033 Acute on chronic diastolic (congestive) heart failure: Secondary | ICD-10-CM

## 2017-08-31 LAB — BASIC METABOLIC PANEL
ANION GAP: 5 (ref 5–15)
BUN: 23 mg/dL — ABNORMAL HIGH (ref 6–20)
CO2: 46 mmol/L — ABNORMAL HIGH (ref 22–32)
Calcium: 8.4 mg/dL — ABNORMAL LOW (ref 8.9–10.3)
Chloride: 87 mmol/L — ABNORMAL LOW (ref 101–111)
Creatinine, Ser: 0.91 mg/dL (ref 0.44–1.00)
GFR, EST NON AFRICAN AMERICAN: 56 mL/min — AB (ref >60)
GLUCOSE: 101 mg/dL — AB (ref 65–99)
POTASSIUM: 3.6 mmol/L (ref 3.5–5.1)
Sodium: 138 mmol/L (ref 135–145)

## 2017-08-31 MED ORDER — FUROSEMIDE 10 MG/ML IJ SOLN: 40.0000 mg | Freq: Every day | INTRAMUSCULAR | Status: DC

## 2017-08-31 NOTE — Progress Notes (Signed)
This patient's family reports a positive C diff test prior to admission. Has completed 10 days of treatment. Per C difficile management policy, patient with admission within 30 days of a positive C diff test should prompt enteric precautions but DO NOT retest .After 30 days have passed and if symptom free ,patient must be transferred to another room for the room to be terminally cleaned with bleach wipes, and curtain change. Based on the information available and our policy patient needs to continue enteric precautions and DO NOT RETEST during admission.

## 2017-08-31 NOTE — Progress Notes (Signed)
PROGRESS NOTE                                                                                                                                                                                                             Patient Demographics:    Andrea [name], is a 83 y.o. female, DOB - 1929/01/20, RSW:[id number]  Admit date - 08/26/2017   Admitting Physician Eber Jones, MD  Outpatient Primary MD for the patient is Unk Pinto, MD  LOS - 5  Outpatient Specialists:None  Chief Complaint  Patient presents with  . Shortness of Breath       Brief Narrative  83 year old female with history of aortic insufficiency/regurgitation, COPD, DJD, hyperlipidemia presented with increasing shortness of breath for past 3-4 days. Patient was reportedly taken off Lasix about 2-3 weeks back due to worsening renal function. Since then the daughters have noticed her leg to have increasingly swollen. She is also being treated for C. Difficile As outpatient. In the ED she was found to have large bilateral pleural effusion with volume overload on chest x-ray. Patient given IV Lasix and admitted to hospitalist service. Despite IV diuresis she still had significant pleural effusion. 2-D echo showed normal EF with grade 2 diastolic dysfunction. Patient underwent Left thoracentesis on 12/3 with 1 L Amber-colored fluid removed.   Subjective:   Still denies shortness of breath but Occasionally tachycardic on the monitor. Diuresing quite well on IV Lasix.   Assessment  & Plan :    Principal Problem: Bilateral pleural effusion Acute on chronic diastolic CHF (HCC) Left-sided thoracentesis done yielding transudative fluid.Symptoms slowly improving and negative balance of almost 6 L since admission. Reduce IV Lasix dose to 40 mg once a day. Continue strict I/O and daily weight. Wean oxygen.Possibly discharge home tomorrow if diuresing  well.   Active Problems: Atrial fibrillation with RVR That occasionally in the 120s. Better after increasing Cardizem and metoprolol dose. Continue anti-correlation.     Essential hypertension Continue Cardizem and metoprolol.  History of C. Difficile Completed 10 day course of Flagyl on 12/4. Asymptomatic.  Hypothyroidism Continue Synthroid.tSH normal.   Protein calorie malnutrition, severe Continue supplement         Code Status : Full code  Family Communication  : Discussed with daughter at bedside on 12/4  Disposition Plan  :Home with home health  Possibly tomorrow if symptoms improved  Barriers For Discharge : Active symptoms  Consults  :  None  Procedures  : Thoracentesis  DVT Prophylaxis  :  eliquis  Lab Results  Component Value Date   PLT 324 08/30/2017    Antibiotics  :    Anti-infectives (From admission, onward)   Start     Dose/Rate Route Frequency Ordered Stop   08/27/17 1000  metroNIDAZOLE (FLAGYL) tablet 500 mg     500 mg Oral 2 times daily 08/27/17 0959          Objective:   Vitals:   08/30/17 0925 08/30/17 1400 08/30/17 2023 08/31/17 0422  BP: (!) 131/56 (!) 110/59 (!) 113/55 129/84  Pulse: (!) 122 (!) 107 97 (!) 111  Resp:   20 19  Temp:  97.7 F (36.5 C) 97.9 F (36.6 C) (!) 97.5 F (36.4 C)  TempSrc:  Oral Oral Oral  SpO2:  97% 99% 96%  Weight:      Height:        Wt Readings from Last 3 Encounters:  08/30/17 47.4 kg (104 lb 8 oz)  08/16/17 48.4 kg (106 lb 12.8 oz)  08/16/17 47.6 kg (105 lb)     Intake/Output Summary (Last 24 hours) at 08/31/2017 1429 Last data filed at 08/31/2017 0500 Gross per 24 hour  Intake 240 ml  Output 1200 ml  Net -960 ml     Physical Exam Gen.: Elderly thin built female appears fatigued HEENT: Moist mucosa, supple neck Chest: Improved Bibasilar breath sounds with fine crackles  CVS: S1 and S2 irregular, systolic murmur 3/6 GI: Soft, nondistended, nontender Muscular skeletal: Warm,  no edema    Data Review:    CBC Recent Labs  Lab 08/26/17 1210 08/27/17 0615 08/29/17 0432 08/30/17 0343  WBC 11.2* 9.4 16.1* 10.5  HGB 11.7* 10.3* 11.1* 10.8*  HCT 39.3 35.1* 38.0 37.5  PLT 384 328 376 324  MCV 94.7 97.2 97.9 97.7  MCH 28.2 28.5 28.6 28.1  MCHC 29.8* 29.3* 29.2* 28.8*  RDW 13.3 13.5 13.7 13.7  LYMPHSABS 1.4  --   --  1.6  MONOABS 0.9  --   --  0.9  EOSABS 0.1  --   --  0.1  BASOSABS 0.0  --   --  0.0    Chemistries  Recent Labs  Lab 08/26/17 1210 08/27/17 0615 08/28/17 0524 08/29/17 0432 08/30/17 0343 08/31/17 0453  NA 134* 139 138 139 137 138  K 4.0 4.5 4.0 4.0 4.2 3.6  CL 95* 98* 93* 90* 88* 87*  CO2 31 37* 41* 39* 43* 46*  GLUCOSE 163* 102* 109* 119* 94 101*  BUN _0 25* 23* 23*  CREATININE 1.04* 1.05* 1.00 0.99 0.86 0.91  CALCIUM 8.8* 8.0* 8.0* 8.5* 8.2* 8.4*  MG  --   --  1.6*  --   --   --   AST 18 13*  --   --   --   --   ALT 9* 8*  --   --   --   --   ALKPHOS 78 62  --   --   --   --   BILITOT 0.5 0.3  --   --   --   --    ------------------------------------------------------------------------------------------------------------------ No results for input(s): CHOL, HDL, LDLCALC, TRIG, CHOLHDL, LDLDIRECT in the last 72 hours.  Lab Results  Component Value Date   HGBA1C 5.4 01/24/2017   ------------------------------------------------------------------------------------------------------------------ No results for input(s): TSH,  T4TOTAL, T3FREE, THYROIDAB in the last 72 hours.  Invalid input(s): FREET3 ------------------------------------------------------------------------------------------------------------------ No results for input(s): VITAMINB12, FOLATE, FERRITIN, TIBC, IRON, RETICCTPCT in the last 72 hours.  Coagulation profile No results for input(s): INR, PROTIME in the last 168 hours.  No results for input(s): DDIMER in the last 72 hours.  Cardiac Enzymes Recent Labs  Lab 08/26/17 1210  TROPONINI <0.03    ------------------------------------------------------------------------------------------------------------------    Component Value Date/Time   BNP 335.0 (H) 08/26/2017 1210   BNP 271.5 (H) 06/04/2014 1003    Inpatient Medications  Scheduled Meds: . ALPRAZolam  0.125-0.25 mg Oral QHS  . apixaban  2.5 mg Oral BID  . diltiazem  60 mg Oral Q8H  . [START ON 09/01/2017] furosemide  40 mg Intravenous Daily  . levothyroxine  50 mcg Oral BH-q7a  . mouth rinse  15 mL Mouth Rinse BID  . metoprolol tartrate  5 mg Intravenous Once  . metoprolol tartrate  25 mg Oral BID  . metroNIDAZOLE  500 mg Oral BID  . mupirocin ointment   Topical BID   Continuous Infusions: PRN Meds:.acetaminophen **OR** acetaminophen, ondansetron **OR** ondansetron (ZOFRAN) IV, polyethylene glycol, traMADol  Micro Results Recent Results (from the past 240 hour(s))  Gram stain     Status: None   Collection Time: 08/29/17 10:55 AM  Result Value Ref Range Status   Specimen Description PLEURAL  Final   Special Requests NONE  Final   Gram Stain   Final    NO ORGANISMS SEEN WBC PRESENT,BOTH PMN AND MONONUCLEAR CYTOSPIN SMEAR    Report Status 08/29/2017 FINAL  Final  Culture, body fluid-bottle     Status: None (Preliminary result)   Collection Time: 08/29/17 10:55 AM  Result Value Ref Range Status   Specimen Description PLEURAL COLLECTED BY DOCTOR  Final   Special Requests BOTTLES DRAWN AEROBIC AND ANAEROBIC 10 CC EACH  Final   Culture NO GROWTH 2 DAYS  Final   Report Status PENDING  Incomplete    Radiology Reports Dg Chest 1 View  Result Date: 08/29/2017 INDICATION: Pleural effusions resistant to diuresis. EXAM: ULTRASOUND GUIDED LEFT THORACENTESIS MEDICATIONS: None. COMPLICATIONS: None immediate. PROCEDURE: An ultrasound guided thoracentesis was thoroughly discussed with the patient and questions answered. The benefits, risks, alternatives and complications were also discussed. The patient is at increased  bleeding risk due to Eliquis use and we had a lengthy discussion about this; the patient's husband was also called on the telephone. The patient understands and wishes to proceed with the procedure. Written consent was obtained. Ultrasound was performed to localize and mark an adequate pocket of fluid in the left chest. The area was then prepped and draped in the normal sterile fashion. 1% Lidocaine was used for local anesthesia. Under ultrasound guidance a 19 gauge, 7-cm, Yueh catheter was introduced. Thoracentesis was performed until the available fluid was drained. I scanned the left chest and there was no evidence of bleeding. The catheter was removed and a dressing applied. FINDINGS: A total of approximately 1 L of amber fluid was removed. Samples were sent to the laboratory as requested by the clinical team. A chest x-ray was obtained and shows significant decrease in left pleural effusion, with small volume fluid remaining. No visible pneumothorax. There is a moderate right pleural effusion. Cardiomegaly and vascular congestion. IMPRESSION: 1. Successful ultrasound guided left thoracentesis yielding 1 L of pleural fluid. 2. Minimal fluid remaining on the left. Right pleural effusion is moderate. Electronically Signed   By: Neva Seat.D.  On: 08/29/2017 11:35   Dg Chest 2 View  Result Date: 08/26/2017 CLINICAL DATA:  Patient having increased SOB over the past few days. Patient is on oxygen at home and states that she usually only wears it at night but has been wearing it all day since having SOB. Hx of pna, htn-controlled with medication, COPD. Ex-smoker. EXAM: CHEST  2 VIEW COMPARISON:  06/28/2017 FINDINGS: The lungs are hyperinflated likely secondary to COPD. There are moderate bilateral pleural effusions with bibasilar atelectasis. There is bilateral diffuse interstitial thickening. There is no pneumothorax. Stable cardiomediastinal silhouette. There is no acute osseous abnormality. There is  severe osteoarthritis of the right glenohumeral joint. There is mild osteoarthritis of the left glenohumeral joint. IMPRESSION: 1. Findings consistent with CHF. Electronically Signed   By: Kathreen Devoid   On: 08/26/2017 13:36   Ct Abdomen Pelvis W Contrast  Result Date: 08/19/2017 CLINICAL DATA:  Diarrhea for 1 week. EXAM: CT ABDOMEN AND PELVIS WITH CONTRAST TECHNIQUE: Multidetector CT imaging of the abdomen and pelvis was performed using the standard protocol following bolus administration of intravenous contrast. CONTRAST:  80 ml ISOVUE-300 IOPAMIDOL (ISOVUE-300) INJECTION 61% COMPARISON:  CT abdomen and pelvis 04/08/2017. FINDINGS: Lower chest: Small to moderate bilateral pleural effusions are present, larger on the left, and increased since the prior CT. Lung bases demonstrate emphysematous disease. Heart size is enlarged. Calcific coronary artery disease is noted. Compressive atelectasis in lung bases is present Hepatobiliary: No focal liver abnormality is seen. 1 cm hypervascular lesion in the anterior left hepatic lobe is better visualized on the prior CT. No gallstones, gallbladder wall thickening, or biliary dilatation. Pancreas: Unremarkable. No pancreatic ductal dilatation or surrounding inflammatory changes. Spleen: Normal in size without focal abnormality. Adrenals/Urinary Tract: Multiple low attenuating lesions in the right kidney are likely cysts and unchanged. No hydronephrosis. The right kidney is ptotic, unchanged. Urinary bladder appears normal. Adrenal glands are unremarkable. Stomach/Bowel: Extensive diverticulosis without diverticulitis is worst in the sigmoid. The colon otherwise appears normal. Stomach and small bowel appear normal. No evidence of appendicitis. Vascular/Lymphatic: Aortic atherosclerosis. No enlarged abdominal or pelvic lymph nodes. Reproductive: Status post hysterectomy. No adnexal masses. Other: No ascites. Musculoskeletal: Severe convex right scoliosis noted. No lytic  or sclerotic lesion. No acute abnormality. IMPRESSION: No acute abnormality abdomen or pelvis. Small to moderate bilateral pleural effusions, larger on the left, increased since the prior CT. Diverticulosis without diverticulitis. Calcific aortic and coronary atherosclerosis. Electronically Signed   By: Inge Rise M.D.   On: 08/19/2017 18:46   Dg Chest Port 1 View  Result Date: 08/30/2017 CLINICAL DATA:  COPD.  Pleural effusion. EXAM: PORTABLE CHEST 1 VIEW COMPARISON:  08/29/2017. FINDINGS: Cardiomegaly with pulmonary venous congestion and bilateral interstitial prominence with bilateral pleural effusions again noted. Findings consistent with CHF. No significant change from prior study of 08/29/2017. No evidence of pneumothorax status post recent thoracentesis. IMPRESSION: 1. Persistent cardiomegaly with pulmonary venous congestion, bilateral interstitial prominence and bilateral pleural effusions again noted without significant change. Findings consistent CHF. 2. No evidence of progression of pleural effusions. No pneumothorax status post recent left thoracentesis. Electronically Signed   By: Marcello Moores  Register   On: 08/30/2017 08:43   Dg Chest Port 1 View  Result Date: 08/28/2017 CLINICAL DATA:  Hypoxemia, bilateral pleural effusions EXAM: PORTABLE CHEST 1 VIEW COMPARISON:  08/27/2017 FINDINGS: Moderate to large bilateral pleural effusions. Diffuse bilateral airspace disease again noted. No real change since prior study. Cardiomegaly. IMPRESSION: Severe diffuse bilateral airspace disease and a large effusions.  No real change. Electronically Signed   By: Rolm Baptise M.D.   On: 08/28/2017 10:27   Dg Chest Port 1 View  Result Date: 08/27/2017 CLINICAL DATA:  Pleural effusion. EXAM: PORTABLE CHEST 1 VIEW COMPARISON:  08/26/2017 FINDINGS: Moderate bilateral pleural effusions. Diffuse bilateral airspace disease, likely edema/ CHF. Cardiomegaly. Slight interval worsening edema since prior study.  IMPRESSION: Worsening aeration of the lungs compatible with worsening edema/ CHF. Moderate bilateral effusions are stable. Electronically Signed   By: Rolm Baptise M.D.   On: 08/27/2017 13:19   US Thoracentesis Asp Pleural Space W/img Guide  Result Date: 08/29/2017 INDICATION: Pleural effusions resistant to diuresis. EXAM: ULTRASOUND GUIDED LEFT THORACENTESIS MEDICATIONS: None. COMPLICATIONS: None immediate. PROCEDURE: An ultrasound guided thoracentesis was thoroughly discussed with the patient and questions answered. The benefits, risks, alternatives and complications were also discussed. The patient is at increased bleeding risk due to Eliquis use and we had a lengthy discussion about this; the patient's husband was also called on the telephone. The patient understands and wishes to proceed with the procedure. Written consent was obtained. Ultrasound was performed to localize and mark an adequate pocket of fluid in the left chest. The area was then prepped and draped in the normal sterile fashion. 1% Lidocaine was used for local anesthesia. Under ultrasound guidance a 19 gauge, 7-cm, Yueh catheter was introduced. Thoracentesis was performed until the available fluid was drained. I scanned the left chest and there was no evidence of bleeding. The catheter was removed and a dressing applied. FINDINGS: A total of approximately 1 L of amber fluid was removed. Samples were sent to the laboratory as requested by the clinical team. A chest x-ray was obtained and shows significant decrease in left pleural effusion, with small volume fluid remaining. No visible pneumothorax. There is a moderate right pleural effusion. Cardiomegaly and vascular congestion. IMPRESSION: 1. Successful ultrasound guided left thoracentesis yielding 1 L of pleural fluid. 2. Minimal fluid remaining on the left. Right pleural effusion is moderate. Electronically Signed   By: Monte Fantasia M.D.   On: 08/29/2017 11:35    Time Spent in  minutes  25   Folashade Gamboa M.D on 08/31/2017 at 2:29 PM  Between 7am to 7pm - Pager - [phone]  After 7pm go to [email or web address] - password [email or web address]  [facility] -  Office  [phone]

## 2017-09-01 ENCOUNTER — Inpatient Hospital Stay (HOSPITAL_COMMUNITY): Payer: Medicare Other

## 2017-09-01 DIAGNOSIS — I5033 Acute on chronic diastolic (congestive) heart failure: Secondary | ICD-10-CM | POA: Diagnosis present

## 2017-09-01 LAB — BASIC METABOLIC PANEL
ANION GAP: 4 — AB (ref 5–15)
BUN: 27 mg/dL — ABNORMAL HIGH (ref 6–20)
CO2: 48 mmol/L — AB (ref 22–32)
Calcium: 8.4 mg/dL — ABNORMAL LOW (ref 8.9–10.3)
Chloride: 87 mmol/L — ABNORMAL LOW (ref 101–111)
Creatinine, Ser: 1.06 mg/dL — ABNORMAL HIGH (ref 0.44–1.00)
GFR calc Af Amer: 54 mL/min — ABNORMAL LOW (ref >60)
GFR, EST NON AFRICAN AMERICAN: 46 mL/min — AB (ref >60)
GLUCOSE: 115 mg/dL — AB (ref 65–99)
POTASSIUM: 3.6 mmol/L (ref 3.5–5.1)
Sodium: 139 mmol/L (ref 135–145)

## 2017-09-01 MED ORDER — FUROSEMIDE 20 MG PO TABS: 20.0000 mg | ORAL_TABLET | Freq: Every day | ORAL | 0 refills | Status: DC

## 2017-09-01 NOTE — Progress Notes (Signed)
Pt's IV catheter removed and intact. Pt's IV site clean dry and intact. Discharge instructions including medications and follow up appointments were reviewed and discussed with patient and her daughter. All questions were answered and no further questions at this time. Pt in stable condition and in no acute distress. Pt will be escorted by nurse tech.

## 2017-09-01 NOTE — ACP (Advance Care Planning) (Signed)
Several attempts to share Advance Directives information have been unsuccessful.

## 2017-09-01 NOTE — Discharge Summary (Signed)
Physician Discharge Summary  Andrea [name] TOI:[id number] DOB: 1928-12-15 DOA: 08/26/2017  PCP: Unk Pinto, MD  Admit date: 08/26/2017 Discharge date: 09/01/2017  Admitted From: Home Disposition:  Home  Recommendations for Outpatient Follow-up:  1. Follow up with PCP in 1 week. Please check BMET During outpatient follow-up  Home Health:PT and RN Equipment/Devices:  Discharge Condition:Fair CODE STATUS:Full code Diet recommendation: Heart Healthy    Discharge Diagnoses:  Principal Problem:  Bilateral Pleural effusion  Active Problems:   Acute on chronic diastolic CHF (congestive heart failure) ([place])   Essential hypertension   COPD with emphysema (HCC)   Hyperlipidemia   Hypothyroidism   AF (paroxysmal atrial fibrillation) (HCC)   Cellulitis of right lower extremity  Brief narrative/history of present illness 83 year old female with history of aortic insufficiency/regurgitation, COPD, DJD, hyperlipidemia presented with increasing shortness of breath for past 3-4 days. Patient was reportedly taken off Lasix about 2-3 weeks back due to worsening renal function. Since then the daughters have noticed her leg to have increasingly swollen. She is also being treated for C. Difficile As outpatient. In the ED she was found to have large bilateral pleural effusion with volume overload on chest x-ray. Patient given IV Lasix and admitted to hospitalist service. Despite IV diuresis she still had significant pleural effusion. 2-D echo showed normal EF with grade 2 diastolic dysfunction. Patient underwent Left thoracentesis on 12/3 with 1 L Amber-colored fluid removed.  Hospital course Bilateral pleural effusion Acute on chronic diastolic CHF (HCC) Left-sided thoracentesis done yielding transudative fluid. Symptoms  improving and negative balance of almost 6 L since admission With IV Lasix. -2-D echo showed normal EF of 60-65% with no wall motion abnormality and grade 2 diastolic  dysfunction. -Follow-up chest x-ray shows Slight decrease in right pleural effusion and decrease in pulmonary vascular congestion. -Discharged on low-dose Lasix daily. Home health arranged.   Active Problems: Atrial fibrillation with RVR Heart rate occasionally into 120s. Rate controlled after Increasing her Cardizem and metoprolol back to her home dose. Continue eliquis.     Essential hypertension Continue Cardizem and metoprolol.  History of C. Difficile Completed 10 day course of Flagyl on 12/4. Asymptomatic.  Hypothyroidism Continue Synthroid. TSH normal.   Protein calorie malnutrition, severe Continue supplement     Anemia of chronic disease Stable.   Family Communication  : Discussed with daughter at bedside on 12/4  Disposition Plan  :Home   Consults  :  None  Procedures  : Thoracentesis     Discharge Instructions   Allergies as of 09/01/2017      Reactions   Vancomycin    Worsening kidney function   Sulfa Antibiotics Palpitations   Unknown      Medication List    STOP taking these medications   clindamycin 300 MG capsule Commonly known as:  CLEOCIN   metroNIDAZOLE 500 MG tablet Commonly known as:  FLAGYL   nystatin 100000 UNIT/ML suspension Commonly known as:  MYCOSTATIN     TAKE these medications   ALPRAZolam 0.25 MG tablet Commonly known as:  XANAX Take 0.125-0.25 mg by mouth at bedtime.   apixaban 2.5 MG Tabs tablet Commonly known as:  ELIQUIS Take 1 tablet (2.5 mg total) by mouth 2 (two) times daily.   cholecalciferol 1000 units tablet Commonly known as:  VITAMIN D Take 1,000 Units by mouth daily.   diltiazem 90 MG tablet Commonly known as:  CARDIZEM Take 90 mg by mouth 4 (four) times daily.   feeding supplement (ENSURE ENLIVE) Liqd  Take 237 mLs by mouth 2 (two) times daily between meals.   furosemide 20 MG tablet Commonly known as:  LASIX Take 1 tablet (20 mg total) by mouth daily. What changed:     when to take this  reasons to take this   levothyroxine 50 MCG tablet Commonly known as:  SYNTHROID, LEVOTHROID TAKE ONE TABLET BY MOUTH ONCE A DAY AS DIRECTED   Metoprolol Tartrate 37.5 MG Tabs Take 37.5 mg by mouth 2 (two) times daily.   PROBIOTIC-10 Chew Chew 2 tablets by mouth daily.   silver sulfADIAZINE 1 % cream Commonly known as:  SILVADENE Apply to affected area daily   traMADol 50 MG tablet Commonly known as:  ULTRAM Take 1 tablet (50 mg total) by mouth every 6 (six) hours as needed.   vitamin C 500 MG tablet Commonly known as:  ASCORBIC ACID Take 500 mg by mouth daily.      Follow-up Information    Unk Pinto, MD. Schedule an appointment as soon as possible for a visit in 1 week(s).   Specialty:  Internal Medicine Contact information: [address] [place] [address] [phone]          Allergies  Allergen Reactions  . Vancomycin     Worsening kidney function  . Sulfa Antibiotics Palpitations    Unknown      Procedures/Studies: Dg Chest 1 View  Result Date: 08/29/2017 INDICATION: Pleural effusions resistant to diuresis. EXAM: ULTRASOUND GUIDED LEFT THORACENTESIS MEDICATIONS: None. COMPLICATIONS: None immediate. PROCEDURE: An ultrasound guided thoracentesis was thoroughly discussed with the patient and questions answered. The benefits, risks, alternatives and complications were also discussed. The patient is at increased bleeding risk due to Eliquis use and we had a lengthy discussion about this; the patient's husband was also called on the telephone. The patient understands and wishes to proceed with the procedure. Written consent was obtained. Ultrasound was performed to localize and mark an adequate pocket of fluid in the left chest. The area was then prepped and draped in the normal sterile fashion. 1% Lidocaine was used for local anesthesia. Under ultrasound guidance a 19 gauge, 7-cm, Yueh catheter was introduced.  Thoracentesis was performed until the available fluid was drained. I scanned the left chest and there was no evidence of bleeding. The catheter was removed and a dressing applied. FINDINGS: A total of approximately 1 L of amber fluid was removed. Samples were sent to the laboratory as requested by the clinical team. A chest x-ray was obtained and shows significant decrease in left pleural effusion, with small volume fluid remaining. No visible pneumothorax. There is a moderate right pleural effusion. Cardiomegaly and vascular congestion. IMPRESSION: 1. Successful ultrasound guided left thoracentesis yielding 1 L of pleural fluid. 2. Minimal fluid remaining on the left. Right pleural effusion is moderate. Electronically Signed   By: Monte Fantasia M.D.   On: 08/29/2017 11:35   Dg Chest 2 View  Result Date: 09/01/2017 CLINICAL DATA:  Pleural effusions EXAM: CHEST  2 VIEW COMPARISON:  08/30/2017 CXR FINDINGS: Stable cardiac silhouette partially obscured by bilateral pleural effusions. Aortic atherosclerosis is noted. Decrease in size of small right pleural effusion with slight increase in left-sided pleural effusion. Adjacent bibasilar atelectasis. Interval decrease in pulmonary vascular redistribution. Osteoarthritis of the [facility] and glenohumeral joint on the right with high-riding right humeral head and remodeling of the undersurface of the acromion. Findings likely reflect chronic rotator cuff tears. IMPRESSION: 1. Slight decrease in right small pleural effusion and minimal increase in small  left pleural effusion since prior. 2. Interval decrease in CHF. 3. Aortic atherosclerosis. Electronically Signed   By: Ashley Royalty M.D.   On: 09/01/2017 14:28   Dg Chest 2 View  Result Date: 08/26/2017 CLINICAL DATA:  Patient having increased SOB over the past few days. Patient is on oxygen at home and states that she usually only wears it at night but has been wearing it all day since having SOB. Hx of pna,  htn-controlled with medication, COPD. Ex-smoker. EXAM: CHEST  2 VIEW COMPARISON:  06/28/2017 FINDINGS: The lungs are hyperinflated likely secondary to COPD. There are moderate bilateral pleural effusions with bibasilar atelectasis. There is bilateral diffuse interstitial thickening. There is no pneumothorax. Stable cardiomediastinal silhouette. There is no acute osseous abnormality. There is severe osteoarthritis of the right glenohumeral joint. There is mild osteoarthritis of the left glenohumeral joint. IMPRESSION: 1. Findings consistent with CHF. Electronically Signed   By: Kathreen Devoid   On: 08/26/2017 13:36   Ct Abdomen Pelvis W Contrast  Result Date: 08/19/2017 CLINICAL DATA:  Diarrhea for 1 week. EXAM: CT ABDOMEN AND PELVIS WITH CONTRAST TECHNIQUE: Multidetector CT imaging of the abdomen and pelvis was performed using the standard protocol following bolus administration of intravenous contrast. CONTRAST:  80 ml ISOVUE-300 IOPAMIDOL (ISOVUE-300) INJECTION 61% COMPARISON:  CT abdomen and pelvis 04/08/2017. FINDINGS: Lower chest: Small to moderate bilateral pleural effusions are present, larger on the left, and increased since the prior CT. Lung bases demonstrate emphysematous disease. Heart size is enlarged. Calcific coronary artery disease is noted. Compressive atelectasis in lung bases is present Hepatobiliary: No focal liver abnormality is seen. 1 cm hypervascular lesion in the anterior left hepatic lobe is better visualized on the prior CT. No gallstones, gallbladder wall thickening, or biliary dilatation. Pancreas: Unremarkable. No pancreatic ductal dilatation or surrounding inflammatory changes. Spleen: Normal in size without focal abnormality. Adrenals/Urinary Tract: Multiple low attenuating lesions in the right kidney are likely cysts and unchanged. No hydronephrosis. The right kidney is ptotic, unchanged. Urinary bladder appears normal. Adrenal glands are unremarkable. Stomach/Bowel: Extensive  diverticulosis without diverticulitis is worst in the sigmoid. The colon otherwise appears normal. Stomach and small bowel appear normal. No evidence of appendicitis. Vascular/Lymphatic: Aortic atherosclerosis. No enlarged abdominal or pelvic lymph nodes. Reproductive: Status post hysterectomy. No adnexal masses. Other: No ascites. Musculoskeletal: Severe convex right scoliosis noted. No lytic or sclerotic lesion. No acute abnormality. IMPRESSION: No acute abnormality abdomen or pelvis. Small to moderate bilateral pleural effusions, larger on the left, increased since the prior CT. Diverticulosis without diverticulitis. Calcific aortic and coronary atherosclerosis. Electronically Signed   By: Inge Rise M.D.   On: 08/19/2017 18:46   Dg Chest Port 1 View  Result Date: 08/30/2017 CLINICAL DATA:  COPD.  Pleural effusion. EXAM: PORTABLE CHEST 1 VIEW COMPARISON:  08/29/2017. FINDINGS: Cardiomegaly with pulmonary venous congestion and bilateral interstitial prominence with bilateral pleural effusions again noted. Findings consistent with CHF. No significant change from prior study of 08/29/2017. No evidence of pneumothorax status post recent thoracentesis. IMPRESSION: 1. Persistent cardiomegaly with pulmonary venous congestion, bilateral interstitial prominence and bilateral pleural effusions again noted without significant change. Findings consistent CHF. 2. No evidence of progression of pleural effusions. No pneumothorax status post recent left thoracentesis. Electronically Signed   By: Marcello Moores  Register   On: 08/30/2017 08:43   Dg Chest Port 1 View  Result Date: 08/28/2017 CLINICAL DATA:  Hypoxemia, bilateral pleural effusions EXAM: PORTABLE CHEST 1 VIEW COMPARISON:  08/27/2017 FINDINGS: Moderate to large bilateral pleural  effusions. Diffuse bilateral airspace disease again noted. No real change since prior study. Cardiomegaly. IMPRESSION: Severe diffuse bilateral airspace disease and a large effusions. No  real change. Electronically Signed   By: Rolm Baptise M.D.   On: 08/28/2017 10:27   Dg Chest Port 1 View  Result Date: 08/27/2017 CLINICAL DATA:  Pleural effusion. EXAM: PORTABLE CHEST 1 VIEW COMPARISON:  08/26/2017 FINDINGS: Moderate bilateral pleural effusions. Diffuse bilateral airspace disease, likely edema/ CHF. Cardiomegaly. Slight interval worsening edema since prior study. IMPRESSION: Worsening aeration of the lungs compatible with worsening edema/ CHF. Moderate bilateral effusions are stable. Electronically Signed   By: Rolm Baptise M.D.   On: 08/27/2017 13:19   US Thoracentesis Asp Pleural Space W/img Guide  Result Date: 08/29/2017 INDICATION: Pleural effusions resistant to diuresis. EXAM: ULTRASOUND GUIDED LEFT THORACENTESIS MEDICATIONS: None. COMPLICATIONS: None immediate. PROCEDURE: An ultrasound guided thoracentesis was thoroughly discussed with the patient and questions answered. The benefits, risks, alternatives and complications were also discussed. The patient is at increased bleeding risk due to Eliquis use and we had a lengthy discussion about this; the patient's husband was also called on the telephone. The patient understands and wishes to proceed with the procedure. Written consent was obtained. Ultrasound was performed to localize and mark an adequate pocket of fluid in the left chest. The area was then prepped and draped in the normal sterile fashion. 1% Lidocaine was used for local anesthesia. Under ultrasound guidance a 19 gauge, 7-cm, Yueh catheter was introduced. Thoracentesis was performed until the available fluid was drained. I scanned the left chest and there was no evidence of bleeding. The catheter was removed and a dressing applied. FINDINGS: A total of approximately 1 L of amber fluid was removed. Samples were sent to the laboratory as requested by the clinical team. A chest x-ray was obtained and shows significant decrease in left pleural effusion, with small volume  fluid remaining. No visible pneumothorax. There is a moderate right pleural effusion. Cardiomegaly and vascular congestion. IMPRESSION: 1. Successful ultrasound guided left thoracentesis yielding 1 L of pleural fluid. 2. Minimal fluid remaining on the left. Right pleural effusion is moderate. Electronically Signed   By: Monte Fantasia M.D.   On: 08/29/2017 11:35       Subjective: Reports of breathing to be much improved.  Discharge Exam: Vitals:   09/01/17 0453 09/01/17 1004  BP: 118/71 (!) 114/54  Pulse: 80 86  Resp: 20   Temp: (!) 97.5 F (36.4 C)   SpO2: 95%    Vitals:   08/31/17 1439 08/31/17 2140 09/01/17 0453 09/01/17 1004  BP: (!) 102/56 118/64 118/71 (!) 114/54  Pulse: 70 89 80 86  Resp: _0 Temp: (!) 97.5 F (36.4 C) 97.9 F (36.6 C) (!) 97.5 F (36.4 C)   TempSrc: Oral Oral Oral   SpO2: 99% 100% 95%   Weight:   47 kg (103 lb 11.2 oz)   Height:        Gen.: Elderly thin built female appears fatigued HEENT: Moist mucosa, supple neck Chest: Improved Breath sounds over bilateral lung base. CVS: S1 and S2 irregular, systolic murmur 3/6 GI: Soft, nondistended, nontender Muscular skeletal: Warm, no edema       The results of significant diagnostics from this hospitalization (including imaging, microbiology, ancillary and laboratory) are listed below for reference.     Microbiology: Recent Results (from the past 240 hour(s))  Fungus Culture With Stain     Status: None (Preliminary result)  Collection Time: 08/29/17 10:30 AM  Result Value Ref Range Status   Fungus Stain Final report  Final    Comment: (NOTE) Performed At: [facility] [place], [place] [id number] Rush Farmer MD QQ:[id number]    Fungus (Mycology) Culture PENDING  Incomplete   Fungal Source PLEURAL  Final  Fungus Culture Result     Status: None   Collection Time: 08/29/17 10:30 AM  Result Value Ref Range Status   Result 1 Comment  Final    Comment:  (NOTE) KOH/Calcofluor preparation:  no fungus observed. Performed At: [facility] [place], [place] [id number] Rush Farmer MD IP:[id number]   Gram stain     Status: None   Collection Time: 08/29/17 10:55 AM  Result Value Ref Range Status   Specimen Description PLEURAL  Final   Special Requests NONE  Final   Gram Stain   Final    NO ORGANISMS SEEN WBC PRESENT,BOTH PMN AND MONONUCLEAR CYTOSPIN SMEAR    Report Status 08/29/2017 FINAL  Final  Culture, body fluid-bottle     Status: None (Preliminary result)   Collection Time: 08/29/17 10:55 AM  Result Value Ref Range Status   Specimen Description PLEURAL COLLECTED BY DOCTOR  Final   Special Requests BOTTLES DRAWN AEROBIC AND ANAEROBIC 10 CC EACH  Final   Culture NO GROWTH 3 DAYS  Final   Report Status PENDING  Incomplete     Labs: BNP (last 3 results) Recent Labs    04/05/17 0359 04/25/17 0430 08/26/17 1210  BNP 433.0* 329.0* 734.1*   Basic Metabolic Panel: Recent Labs  Lab 08/28/17 0524 08/29/17 0432 08/30/17 0343 08/31/17 0453 09/01/17 0456  NA 138 139 137 138 139  K 4.0 4.0 4.2 3.6 3.6  CL 93* 90* 88* 87* 87*  CO2 41* 39* 43* 46* 48*  GLUCOSE 109* 119* 94 101* 115*  BUN 20 25* 23* 23* 27*  CREATININE 1.00 0.99 0.86 0.91 1.06*  CALCIUM 8.0* 8.5* 8.2* 8.4* 8.4*  MG 1.6*  --   --   --   --    Liver Function Tests: Recent Labs  Lab 08/26/17 1210 08/27/17 0615 08/29/17 0432  AST 18 13*  --   ALT 9* 8*  --   ALKPHOS 78 62  --   BILITOT 0.5 0.3  --   PROT 7.6 5.9* 6.8  ALBUMIN 3.0* 2.5* 2.8*   No results for input(s): LIPASE, AMYLASE in the last 168 hours. No results for input(s): AMMONIA in the last 168 hours. CBC: Recent Labs  Lab 08/26/17 1210 08/27/17 0615 08/29/17 0432 08/30/17 0343  WBC 11.2* 9.4 16.1* 10.5  NEUTROABS 8.8*  --   --  7.9*  HGB 11.7* 10.3* 11.1* 10.8*  HCT 39.3 35.1* 38.0 37.5  MCV 94.7 97.2 97.9 97.7  PLT 384 328 376 324   Cardiac Enzymes: Recent  Labs  Lab 08/26/17 1210  TROPONINI <0.03   BNP: Invalid input(s): POCBNP CBG: No results for input(s): GLUCAP in the last 168 hours. D-Dimer No results for input(s): DDIMER in the last 72 hours. Hgb A1c No results for input(s): HGBA1C in the last 72 hours. Lipid Profile No results for input(s): CHOL, HDL, LDLCALC, TRIG, CHOLHDL, LDLDIRECT in the last 72 hours. Thyroid function studies No results for input(s): TSH, T4TOTAL, T3FREE, THYROIDAB in the last 72 hours.  Invalid input(s): FREET3 Anemia work up No results for input(s): VITAMINB12, FOLATE, FERRITIN, TIBC, IRON, RETICCTPCT in the last 72 hours. Urinalysis  Component Value Date/Time   COLORURINE STRAW (A) 08/19/2017 1855   APPEARANCEUR CLEAR 08/19/2017 1855   LABSPEC 1.015 08/19/2017 1855   PHURINE 6.0 08/19/2017 1855   GLUCOSEU 50 (A) 08/19/2017 1855   HGBUR NEGATIVE 08/19/2017 1855   BILIRUBINUR NEGATIVE 08/19/2017 1855   KETONESUR NEGATIVE 08/19/2017 1855   PROTEINUR 30 (A) 08/19/2017 1855   UROBILINOGEN 0.2 08/20/2014 1613   NITRITE NEGATIVE 08/19/2017 1855   LEUKOCYTESUR MODERATE (A) 08/19/2017 1855   Sepsis Labs Invalid input(s): PROCALCITONIN,  WBC,  LACTICIDVEN Microbiology Recent Results (from the past 240 hour(s))  Fungus Culture With Stain     Status: None (Preliminary result)   Collection Time: 08/29/17 10:30 AM  Result Value Ref Range Status   Fungus Stain Final report  Final    Comment: (NOTE) Performed At: [facility] [place], [place] [id number] Rush Farmer MD YP:[id number]    Fungus (Mycology) Culture PENDING  Incomplete   Fungal Source PLEURAL  Final  Fungus Culture Result     Status: None   Collection Time: 08/29/17 10:30 AM  Result Value Ref Range Status   Result 1 Comment  Final    Comment: (NOTE) KOH/Calcofluor preparation:  no fungus observed. Performed At: [facility] [place], [place] [id number] Rush Farmer MD  IT:[id number]   Gram stain     Status: None   Collection Time: 08/29/17 10:55 AM  Result Value Ref Range Status   Specimen Description PLEURAL  Final   Special Requests NONE  Final   Gram Stain   Final    NO ORGANISMS SEEN WBC PRESENT,BOTH PMN AND MONONUCLEAR CYTOSPIN SMEAR    Report Status 08/29/2017 FINAL  Final  Culture, body fluid-bottle     Status: None (Preliminary result)   Collection Time: 08/29/17 10:55 AM  Result Value Ref Range Status   Specimen Description PLEURAL COLLECTED BY DOCTOR  Final   Special Requests BOTTLES DRAWN AEROBIC AND ANAEROBIC 10 CC EACH  Final   Culture NO GROWTH 3 DAYS  Final   Report Status PENDING  Incomplete     Time coordinating discharge: Over 30 minutes  SIGNED:   Louellen Molder, MD  [facility] 09/01/2017, 2:54 PM Pager   If 7PM-7AM, please contact night-coverage [email or web address] Password [email or web address]

## 2017-09-01 NOTE — Progress Notes (Signed)
Physical Therapy Treatment Patient Details Name: Andrea [name] MRN: [id number] DOB: [birth year] Today's Date: 09/01/2017    History of Present Illness Andrea [name] is a 83 y.o. female with medical history significant of aortic insufficiency, aortic regurg, COPD, DJD, HLD brought in by her daughters for concern of shortness of breath and weakness. Patient reports that 3-4 days ago she began having symptoms but her daughter says that her symptoms actually began a week ago.  Patient was taken off lasix about 2-3 weeks ago.  Per her daughter this was due to decreased kidney function.  Since that time patient's legs have increased in swelling. Patient denies fevers, chills.  No palpitations, no diarrhea but patient daughter reports she is being treated for C. Diff and that this is her last day of treatment.     PT Comments    Pt supine, friendly and willing to participate with therapy.  Pt independent with bed mobility with minimal cueing for hand placement to assist.  Increased cadence with no c/o SOB or LOB episodes during gait training, increased distance to 45 feet.  EOS pt requested to return to bed stating she has sit majority of day.  No c/o or pain of SOB during session.      Follow Up Recommendations  Home health PT;Supervision for mobility/OOB     Equipment Recommendations  None recommended by PT    Recommendations for Other Services       Precautions / Restrictions Precautions Precautions: Fall    Mobility  Bed Mobility Overal bed mobility: Independent Bed Mobility: Supine to Sit;Sit to Supine     Supine to sit: Supervision Sit to supine: Supervision   General bed mobility comments: min cueing for hand placement to assist with bed mobility  Transfers Overall transfer level: Needs assistance Equipment used: Rolling walker (2 wheeled) Transfers: Sit to/from Stand Sit to Stand: Min guard         General transfer comment: min cueing for hand placement to assist with  STS.  Stable with initial standing and increased cadence with no LOB episodes  Ambulation/Gait Ambulation/Gait assistance: Min guard Ambulation Distance (Feet): 45 Feet Assistive device: Rolling walker (2 wheeled) Gait Pattern/deviations: Decreased step length - right;Decreased step length - left;Decreased stride length     General Gait Details: increased cadence and no labored SOB or LOB during session.  was limited by fatigue at EOS.     Stairs            Wheelchair Mobility    Modified Rankin (Stroke Patients Only)       Balance                                            Cognition Arousal/Alertness: Awake/alert Behavior During Therapy: WFL for tasks assessed/performed Overall Cognitive Status: Within Functional Limits for tasks assessed                                        Exercises      General Comments        Pertinent Vitals/Pain Pain Assessment: No/denies pain    Home Living                      Prior Function  PT Goals (current goals can now be found in the care plan section) Progress towards PT goals: Progressing toward goals    Frequency    Min 3X/week      PT Plan Current plan remains appropriate    Co-evaluation              AM-PAC PT "6 Clicks" Daily Activity  Outcome Measure  Difficulty turning over in bed (including adjusting bedclothes, sheets and blankets)?: None Difficulty moving from lying on back to sitting on the side of the bed? : None Difficulty sitting down on and standing up from a chair with arms (e.g., wheelchair, bedside commode, etc,.)?: A Little Help needed moving to and from a bed to chair (including a wheelchair)?: A Little Help needed walking in hospital room?: A Little Help needed climbing 3-5 steps with a railing? : A Little 6 Click Score: 20    End of Session Equipment Utilized During Treatment: Gait belt Activity Tolerance: Patient  tolerated treatment well;Patient limited by fatigue Patient left: in bed;with call bell/phone within reach(requested return to bed) Nurse Communication: Mobility status PT Visit Diagnosis: Unsteadiness on feet (R26.81);Other abnormalities of gait and mobility (R26.89);Muscle weakness (generalized) (M62.81)     Time: 5465-0354 PT Time Calculation (min) (ACUTE ONLY): 23 min  Charges:  $Therapeutic Activity: 8-22 mins                    G Codes:       Ihor Austin, LPTA; CBIS [phone]  Aldona Lento 09/01/2017, 3:31 PM

## 2017-09-01 NOTE — Care Management Note (Signed)
Case Management Note  Patient Details  Name: YOSSELIN ZOELLER MRN: [id number] Date of Birth: Apr 02, 1929  Expected Discharge Date:  08/29/17               Expected Discharge Plan:  [place]  In-House Referral:  NA  Discharge planning Services  CM Consult  Post Acute Care Choice:  Home Health Choice offered to:  Patient  DME Arranged:    DME Agency:  [place]:  PT [facility] Agency:     Status of Service:  Completed, signed off  If discussed at [place] of Stay Meetings, dates discussed:    Additional Comments: Anticipate pt will DC home in next 24 hrs. Pt aware HH has 48 hrs to resume services. Vaughan Basta, [facility] rep, aware of pending DC. Pt has ambulated on RA and does not desaturate.   Sherald Barge, RN 09/01/2017, 2:50 PM

## 2017-09-02 DIAGNOSIS — Z7951 Long term (current) use of inhaled steroids: Secondary | ICD-10-CM | POA: Diagnosis not present

## 2017-09-02 DIAGNOSIS — Z79891 Long term (current) use of opiate analgesic: Secondary | ICD-10-CM | POA: Diagnosis not present

## 2017-09-02 DIAGNOSIS — Z9981 Dependence on supplemental oxygen: Secondary | ICD-10-CM | POA: Diagnosis not present

## 2017-09-02 DIAGNOSIS — J439 Emphysema, unspecified: Secondary | ICD-10-CM | POA: Diagnosis not present

## 2017-09-02 DIAGNOSIS — L03115 Cellulitis of right lower limb: Secondary | ICD-10-CM | POA: Diagnosis not present

## 2017-09-02 DIAGNOSIS — I351 Nonrheumatic aortic (valve) insufficiency: Secondary | ICD-10-CM | POA: Diagnosis not present

## 2017-09-02 DIAGNOSIS — I480 Paroxysmal atrial fibrillation: Secondary | ICD-10-CM | POA: Diagnosis not present

## 2017-09-02 DIAGNOSIS — I110 Hypertensive heart disease with heart failure: Secondary | ICD-10-CM | POA: Diagnosis not present

## 2017-09-02 DIAGNOSIS — I5033 Acute on chronic diastolic (congestive) heart failure: Secondary | ICD-10-CM | POA: Diagnosis not present

## 2017-09-02 DIAGNOSIS — E039 Hypothyroidism, unspecified: Secondary | ICD-10-CM | POA: Diagnosis not present

## 2017-09-02 DIAGNOSIS — E785 Hyperlipidemia, unspecified: Secondary | ICD-10-CM | POA: Diagnosis not present

## 2017-09-03 LAB — CULTURE, BODY FLUID-BOTTLE: CULTURE: NO GROWTH

## 2017-09-03 LAB — CULTURE, BODY FLUID W GRAM STAIN -BOTTLE

## 2017-09-04 DIAGNOSIS — J449 Chronic obstructive pulmonary disease, unspecified: Secondary | ICD-10-CM | POA: Diagnosis not present

## 2017-09-05 DIAGNOSIS — I110 Hypertensive heart disease with heart failure: Secondary | ICD-10-CM | POA: Diagnosis not present

## 2017-09-06 ENCOUNTER — Ambulatory Visit: Payer: Medicare Other | Admitting: General Surgery

## 2017-09-07 ENCOUNTER — Ambulatory Visit: Payer: Self-pay | Admitting: Internal Medicine

## 2017-09-08 ENCOUNTER — Encounter: Payer: Self-pay | Admitting: Internal Medicine

## 2017-09-08 ENCOUNTER — Ambulatory Visit (INDEPENDENT_AMBULATORY_CARE_PROVIDER_SITE_OTHER): Payer: Medicare Other | Admitting: Internal Medicine

## 2017-09-08 VITALS — BP 125/73 | HR 93 | Temp 97.3°F | Resp 18 | Ht 63.0 in | Wt 108.2 lb

## 2017-09-08 DIAGNOSIS — A09 Infectious gastroenteritis and colitis, unspecified: Secondary | ICD-10-CM

## 2017-09-08 DIAGNOSIS — I5043 Acute on chronic combined systolic (congestive) and diastolic (congestive) heart failure: Secondary | ICD-10-CM | POA: Diagnosis not present

## 2017-09-08 DIAGNOSIS — E039 Hypothyroidism, unspecified: Secondary | ICD-10-CM

## 2017-09-08 DIAGNOSIS — I10 Essential (primary) hypertension: Secondary | ICD-10-CM | POA: Diagnosis not present

## 2017-09-08 DIAGNOSIS — Z79899 Other long term (current) drug therapy: Secondary | ICD-10-CM | POA: Diagnosis not present

## 2017-09-08 DIAGNOSIS — J90 Pleural effusion, not elsewhere classified: Secondary | ICD-10-CM | POA: Diagnosis not present

## 2017-09-08 DIAGNOSIS — L03115 Cellulitis of right lower limb: Secondary | ICD-10-CM

## 2017-09-08 DIAGNOSIS — I480 Paroxysmal atrial fibrillation: Secondary | ICD-10-CM | POA: Diagnosis not present

## 2017-09-08 DIAGNOSIS — E782 Mixed hyperlipidemia: Secondary | ICD-10-CM

## 2017-09-08 DIAGNOSIS — J439 Emphysema, unspecified: Secondary | ICD-10-CM

## 2017-09-08 LAB — CBC WITH DIFFERENTIAL/PLATELET
BASOS ABS: 45 {cells}/uL (ref 0–200)
Basophils Relative: 0.4 %
EOS PCT: 0.4 %
Eosinophils Absolute: 45 cells/uL (ref 15–500)
HCT: 34.2 % — ABNORMAL LOW (ref 35.0–45.0)
Hemoglobin: 10.9 g/dL — ABNORMAL LOW (ref 11.7–15.5)
Lymphs Abs: 1661 cells/uL (ref 850–3900)
MCH: 28.6 pg (ref 27.0–33.0)
MCHC: 31.9 g/dL — AB (ref 32.0–36.0)
MCV: 89.8 fL (ref 80.0–100.0)
MONOS PCT: 9.1 %
MPV: 11.7 fL (ref 7.5–12.5)
NEUTROS ABS: 8520 {cells}/uL — AB (ref 1500–7800)
NEUTROS PCT: 75.4 %
Platelets: 340 10*3/uL (ref 140–400)
RBC: 3.81 10*6/uL (ref 3.80–5.10)
RDW: 12.3 % (ref 11.0–15.0)
Total Lymphocyte: 14.7 %
WBC mixed population: 1028 cells/uL — ABNORMAL HIGH (ref 200–950)
WBC: 11.3 10*3/uL — AB (ref 3.8–10.8)

## 2017-09-08 LAB — BASIC METABOLIC PANEL WITH GFR
BUN/Creatinine Ratio: 28 (calc) — ABNORMAL HIGH (ref 6–22)
BUN: 33 mg/dL — ABNORMAL HIGH (ref 7–25)
CHLORIDE: 95 mmol/L — AB (ref 98–110)
CO2: 39 mmol/L — AB (ref 20–32)
Calcium: 9.2 mg/dL (ref 8.6–10.4)
Creat: 1.2 mg/dL — ABNORMAL HIGH (ref 0.60–0.88)
GFR, Est African American: 47 mL/min/{1.73_m2} — ABNORMAL LOW (ref >=60)
GFR, Est Non African American: 41 mL/min/{1.73_m2} — ABNORMAL LOW (ref >=60)
GLUCOSE: 99 mg/dL (ref 65–99)
POTASSIUM: 4.3 mmol/L (ref 3.5–5.3)
SODIUM: 140 mmol/L (ref 135–146)

## 2017-09-08 NOTE — Patient Instructions (Signed)
Recommend Adult Low Dose Aspirin or  coated  Aspirin 81 mg daily  To reduce risk of Colon Cancer 20 %,  Skin Cancer 26 % ,  Melanoma 46%  and  Pancreatic cancer 60% +++++++++++++++++++++++++ Vitamin D goal  is between 70-100.  Please make sure that you are taking your Vitamin D as directed.  It is very important as a natural anti-inflammatory  helping hair, skin, and nails, as well as reducing stroke and heart attack risk.  It helps your bones and helps with mood. It also decreases numerous cancer risks so please take it as directed.  Low Vit D is associated with a 200-300% higher risk for CANCER  and 200-300% higher risk for HEART   ATTACK  &  STROKE.   ...................................... It is also associated with higher death rate at younger ages,  autoimmune diseases like Rheumatoid arthritis, Lupus, Multiple Sclerosis.    Also many other serious conditions, like depression, Alzheimer's Dementia, infertility, muscle aches, fatigue, fibromyalgia - just to name a few. ++++++++++++++++++++ Recommend the book "The END of DIETING" by Dr Joel Fuhrman  & the book "The END of DIABETES " by Dr Joel Fuhrman At [employer] - get book & Audio CD's    Being diabetic has a  300% increased risk for heart attack, stroke, cancer, and alzheimer- type vascular dementia. It is very important that you work harder with diet by avoiding all foods that are white. Avoid white rice (brown & wild rice is OK), white potatoes (sweetpotatoes in moderation is OK), White bread or wheat bread or anything made out of white flour like bagels, donuts, rolls, buns, biscuits, cakes, pastries, cookies, pizza crust, and pasta (made from white flour & egg whites) - vegetarian pasta or spinach or wheat pasta is OK. Multigrain breads like Arnold's or Pepperidge Farm, or multigrain sandwich thins or flatbreads.  Diet, exercise and weight loss can reverse and cure diabetes in the early stages.  Diet, exercise and weight loss is  very important in the control and prevention of complications of diabetes which affects every system in your body, ie. Brain - dementia/stroke, eyes - glaucoma/blindness, heart - heart attack/heart failure, kidneys - dialysis, stomach - gastric paralysis, intestines - malabsorption, nerves - severe painful neuritis, circulation - gangrene & loss of a leg(s), and finally cancer and Alzheimers.    I recommend avoid fried & greasy foods,  sweets/candy, white rice (brown or wild rice or Quinoa is OK), white potatoes (sweet potatoes are OK) - anything made from white flour - bagels, doughnuts, rolls, buns, biscuits,white and wheat breads, pizza crust and traditional pasta made of white flour & egg white(vegetarian pasta or spinach or wheat pasta is OK).  Multi-grain bread is OK - like multi-grain flat bread or sandwich thins. Avoid alcohol in excess. Exercise is also important.    Eat all the vegetables you want - avoid meat, especially red meat and dairy - especially cheese.  Cheese is the most concentrated form of trans-fats which is the worst thing to clog up our arteries. Veggie cheese is OK which can be found in the fresh produce section at [employer] or [employer] or [employer]  +++++++++++++++++++++ DASH Eating Plan  DASH stands for "Dietary Approaches to Stop Hypertension."   The DASH eating plan is a healthy eating plan that has been shown to reduce high blood pressure (hypertension). Additional health benefits may include reducing the risk of type 2 diabetes mellitus, heart disease, and stroke. The DASH eating plan may also   help with weight loss. WHAT DO I NEED TO KNOW ABOUT THE DASH EATING PLAN? For the DASH eating plan, you will follow these general guidelines:  Choose foods with a percent daily value for sodium of less than 5% (as listed on the food label).  Use salt-free seasonings or herbs instead of table salt or sea salt.  Check with your health care provider or pharmacist before  using salt substitutes.  Eat lower-sodium products, often labeled as "lower sodium" or "no salt added."  Eat fresh foods.  Eat more vegetables, fruits, and low-fat dairy products.  Choose whole grains. Look for the word "whole" as the first word in the ingredient list.  Choose fish   Limit sweets, desserts, sugars, and sugary drinks.  Choose heart-healthy fats.  Eat veggie cheese   Eat more home-cooked food and less restaurant, buffet, and fast food.  Limit fried foods.  Cook foods using methods other than frying.  Limit canned vegetables. If you do use them, rinse them well to decrease the sodium.  When eating at a restaurant, ask that your food be prepared with less salt, or no salt if possible.                      WHAT FOODS CAN I EAT? Read Dr Fara Olden Fuhrman's books on The End of Dieting & The End of Diabetes  Grains Whole grain or whole wheat bread. Brown rice. Whole grain or whole wheat pasta. Quinoa, bulgur, and whole grain cereals. Low-sodium cereals. Corn or whole wheat flour tortillas. Whole grain cornbread. Whole grain crackers. Low-sodium crackers.  Vegetables Fresh or frozen vegetables (raw, steamed, roasted, or grilled). Low-sodium or reduced-sodium tomato and vegetable juices. Low-sodium or reduced-sodium tomato sauce and paste. Low-sodium or reduced-sodium canned vegetables.   Fruits All fresh, canned (in natural juice), or frozen fruits.  Protein Products  All fish and seafood.  Dried beans, peas, or lentils. Unsalted nuts and seeds. Unsalted canned beans.  Dairy Low-fat dairy products, such as skim or 1% milk, 2% or reduced-fat cheeses, low-fat ricotta or cottage cheese, or plain low-fat yogurt. Low-sodium or reduced-sodium cheeses.  Fats and Oils Tub margarines without trans fats. Light or reduced-fat mayonnaise and salad dressings (reduced sodium). Avocado. Safflower, olive, or canola oils. Natural peanut or almond butter.  Other Unsalted popcorn  and pretzels. The items listed above may not be a complete list of recommended foods or beverages. Contact your dietitian for more options.  +++++++++++++++  WHAT FOODS ARE NOT RECOMMENDED? Grains/ White flour or wheat flour White bread. White pasta. White rice. Refined cornbread. Bagels and croissants. Crackers that contain trans fat.  Vegetables  Creamed or fried vegetables. Vegetables in a . Regular canned vegetables. Regular canned tomato sauce and paste. Regular tomato and vegetable juices.  Fruits Dried fruits. Canned fruit in light or heavy syrup. Fruit juice.  Meat and Other Protein Products Meat in general - RED meat & White meat.  Fatty cuts of meat. Ribs, chicken wings, all processed meats as bacon, sausage, bologna, salami, fatback, hot dogs, bratwurst and packaged luncheon meats.  Dairy Whole or 2% milk, cream, half-and-half, and cream cheese. Whole-fat or sweetened yogurt. Full-fat cheeses or blue cheese. Non-dairy creamers and whipped toppings. Processed cheese, cheese spreads, or cheese curds.  Condiments Onion and garlic salt, seasoned salt, table salt, and sea salt. Canned and packaged gravies. Worcestershire sauce. Tartar sauce. Barbecue sauce. Teriyaki sauce. Soy sauce, including reduced sodium. Steak sauce. Fish sauce. Oyster sauce. Cocktail  sauce. Horseradish. Ketchup and mustard. Meat flavorings and tenderizers. Bouillon cubes. Hot sauce. Tabasco sauce. Marinades. Taco seasonings. Relishes.  Fats and Oils Butter, stick margarine, lard, shortening and bacon fat. Coconut, palm kernel, or palm oils. Regular salad dressings.  Pickles and olives. Salted popcorn and pretzels.  The items listed above may not be a complete list of foods and beverages to avoid.

## 2017-09-08 NOTE — Progress Notes (Signed)
Andrea [name]     This very nice 83 y.o. MWF was admitted Aug 26, 2017 and presents for follow up for transition from recent hospitalization. And patient was discharged from the hospital on  Sep 01, 2017 and the day after discharge  our clinical staff contacted the patient to assure stability and schedule a follow up appointment. The discharge summary, medications and diagnostic test results were reviewed before meeting with the patient. The patient was admitted for: Bilateral Pleural Effusions, acute on chronic CHF, HTN, COPD/Emphysema, Hypothyroidism, pAfib, Cellulitis of the RLE and hx/o Ao Insufficiency. During hospitalization, patient was diuresed, had large volume thoracentesis and was re-treated for C.Diff enteritis/diarrhea. Since return home, patient reports last 2-3 days having mucoid diarrhea.     Hospitalization discharge instructions and medications are reconciled with the patient and her daughter.      Patient is also followed with Hypertension, Hyperlipidemia, Pre-Diabetes and Vitamin D Deficiency.      Patient is treated for HTN (1990's) & BP has been controlled at home. Today's BP is 125/73  P 93 sitting and standing BP 116/78  P 92. Patient has had no complaints of any cardiac type chest pain, palpitations, dyspnea/orthopnea/PND, dizziness, claudication, or dependent edema.     Hyperlipidemia is controlled with diet & meds. Patient denies myalgias or other med SE's. Last Lipids were  Lab Results  Component Value Date   CHOL 211 (H) 08/16/2017   HDL 70 08/16/2017   LDLCALC 104 (H) 01/24/2017   TRIG 97 08/16/2017   CHOLHDL 3.0 08/16/2017      Also, the patient has history of PreDiabetes (2012) and has had no symptoms of reactive hypoglycemia, diabetic polys, paresthesias or visual blurring.  Last A1c was at goal.  Lab Results  Component Value Date   HGBA1C 5.4 01/24/2017      Patient has been on Thyroid Replacement since 2014. Further, the patient also has history  of Vitamin D Deficiency and supplements vitamin D without any suspected side-effects. Last vitamin D was   Lab Results  Component Value Date   VD25OH 1 01/24/2017   Current Outpatient Medications on File Prior to Visit  Medication Sig  . ALPRAZolam (XANAX) 0.25 MG tablet Take 0.125-0.25 mg by mouth at bedtime.   [name] apixaban (ELIQUIS) 2.5 MG TABS tablet Take 1 tablet (2.5 mg total) by mouth 2 (two) times daily.  . Ascorbic Acid (VITAMIN C) 500 MG tablet Take 500 mg by mouth daily.    . cholecalciferol (VITAMIN D) 1000 UNITS tablet Take 1,000 Units by mouth daily.   [name] diltiazem (CARDIZEM) 90 MG tablet Take 90 mg by mouth 4 (four) times daily.  . feeding supplement, ENSURE ENLIVE, (ENSURE ENLIVE) LIQD Take 237 mLs by mouth 2 (two) times daily between meals.  . furosemide (LASIX) 20 MG tablet Take 1 tablet (20 mg total) by mouth daily.  [name] levothyroxine (SYNTHROID, LEVOTHROID) 50 MCG tablet TAKE ONE TABLET BY MOUTH ONCE A DAY AS DIRECTED  . Metoprolol Tartrate 37.5 MG TABS Take 37.5 mg by mouth 2 (two) times daily.  . Probiotic Product (PROBIOTIC-10) CHEW Chew 2 tablets by mouth daily.  . silver sulfADIAZINE (SILVADENE) 1 % cream Apply to affected area daily  . traMADol (ULTRAM) 50 MG tablet Take 1 tablet (50 mg total) by mouth every 6 (six) hours as needed.   No current facility-administered medications on file prior to visit.    Allergies  Allergen Reactions  . Vancomycin     Worsening kidney  function  . Sulfa Antibiotics Palpitations    Unknown   PMHx:   Past Medical History:  Diagnosis Date  . Aortic insufficiency 09/04/2015  . Aortic regurgitation   . Bronchiectasis ([place])   . COPD (chronic obstructive pulmonary disease) ([place])   . DJD (degenerative joint disease)   . HLD (hyperlipidemia)   . HTN (hypertension)   . Mild mitral regurgitation by prior echocardiogram   . Mild mitral regurgitation by prior echocardiogram   . Mild tricuspid regurgitation   . Palpitations    a. has  known history of PAC's and PVC's. b. 09/2015: monitor showed sinus rhythm with occasional PAC's and a brief episode of PAT.  [name] Thyroid disease    Immunization History  Administered Date(s) Administered  . Influenza Split 07/16/2013  . Influenza, High Dose Seasonal PF 06/25/2015, 06/10/2016, 06/30/2017  . Influenza,inj,Quad PF,6+ Mos 07/04/2014  . Pneumococcal Conjugate-13 02/26/2014  . Pneumococcal Polysaccharide-23 10/04/2016  . Td 06/26/2013  . Tdap 06/19/2017   Past Surgical History:  Procedure Laterality Date  . ABDOMINAL HYSTERECTOMY  1993  . CATARACT EXTRACTION, BILATERAL    . WOUND DEBRIDEMENT Right 06/29/2017   Procedure: RIGHT LOWER  LEG DEBRIDEMENT;  Surgeon: [name], MD;  Location: [facility];  Service: General;  Laterality: Right;   FHx:    Reviewed / unchanged  SHx:    Reviewed / unchanged  Systems Review:  Constitutional: Denies fever, chills, wt changes, headaches, insomnia, fatigue, night sweats, change in appetite. Eyes: Denies redness, blurred vision, diplopia, discharge, itchy, watery eyes.  ENT: Denies discharge, congestion, post nasal drip, epistaxis, sore throat, earache, hearing loss, dental pain, tinnitus, vertigo, sinus pain, snoring.  CV: Denies chest pain, palpitations, irregular heartbeat, syncope, dyspnea, diaphoresis, orthopnea, PND, claudication or edema. Respiratory: denies cough, dyspnea, DOE, pleurisy, hoarseness, laryngitis, wheezing.  Gastrointestinal: Denies dysphagia, odynophagia, heartburn, reflux, water brash, abdominal pain or cramps, nausea, vomiting, bloating, diarrhea, constipation, hematemesis, melena, hematochezia  or hemorrhoids. Genitourinary: Denies dysuria, frequency, urgency, nocturia, hesitancy, discharge, hematuria or flank pain. Musculoskeletal: Denies arthralgias, myalgias, stiffness, jt. swelling, pain, limping or strain/sprain.  Skin: Denies pruritus, rash, hives, warts, acne, eczema or change in skin lesion(s). Neuro:  No weakness, tremor, incoordination, spasms, paresthesia or pain. Psychiatric: Denies confusion, memory loss or sensory loss. Endo: Denies change in weight, skin or hair change.  Heme/Lymph: No excessive bleeding, bruising or enlarged lymph nodes.  Physical Exam  BP (!) 100/56   Pulse 84   Temp (!) 97.3 F (36.3 C)   Resp 18   Ht _0  (1.6 m)   Wt 108 lb 3.2 oz (49.1 kg)   BMI 19.17 kg/m   Appears well nourished, well groomed  and in no distress.  Eyes: PERRLA, EOMs, conjunctiva no swelling or erythema. Sinuses: No frontal/maxillary tenderness ENT/Mouth: EAC's clear, TM's nl w/o erythema, bulging. Nares clear w/o erythema, swelling, exudates. Oropharynx clear without erythema or exudates. Oral hygiene is good. Tongue normal, non obstructing. Hearing intact.  Neck: Supple. Thyroid nl. Car 2+/2+ without bruits, nodes or JVD. Chest: Respirations nl with BS clear & equal w/o rales, rhonchi, wheezing or stridor.  Cor: Heart sounds normal w/ regular rate and rhythm without sig. murmurs, gallops, clicks or rubs. Peripheral pulses normal and equal  without edema.  Abdomen: Soft & bowel sounds normal. Non-tender w/o guarding, rebound, hernias, masses or organomegaly.  Lymphatics: Unremarkable.  Musculoskeletal: Full ROM all peripheral extremities, joint stability, 5/5 strength and normal gait.  Skin: Warm, dry without exposed rashes, lesions or  ecchymosis apparent.  Neuro: Cranial nerves intact, reflexes equal bilaterally. Sensory-motor testing grossly intact. Tendon reflexes grossly intact.  Pysch: Alert & oriented x 3.  Insight and judgement nl & appropriate. No ideations.  Assessment and Plan:  1. Bilateral pleural effusion  2. Acute on chronic combined systolic and diastolic CHF (congestive heart failure) ([place])  3. Essential hypertension  - Continue medication, monitor blood pressure at home.  - Continue DASH diet. Reminder to go to the ER if any CP,  SOB, nausea, dizziness,  severe HA, changes vision/speech  - CBC with Differential/Platelet - BASIC METABOLIC PANEL WITH GFR  4. Pulmonary emphysema ([place])  5. Hyperlipidemia, mixed  - Continue diet/meds, exercise,& lifestyle modifications.  - Continue monitor periodic cholesterol/liver & renal functions   6. Hypothyroidism   7. AF (paroxysmal atrial fibrillation) ([facility])  - BASIC METABOLIC PANEL WITH GFR  8. Cellulitis of right lower extremity  - CBC with Differential/Platelet  9. Diarrhea of infectious origin  - Gastrointestinal Pathogen Panel PCR  10. Medication management  - CBC with Differential/Platelet - BASIC METABOLIC PANEL WITH GFR       Discussed  regular exercise, BP monitoring, weight control to achieve/maintain BMI less than 25 and discussed med and SE's. Recommended labs to assess and monitor clinical status with further disposition pending results of labs. Over 30 minutes of exam, counseling, chart review was performed.

## 2017-09-09 DIAGNOSIS — E039 Hypothyroidism, unspecified: Secondary | ICD-10-CM | POA: Diagnosis not present

## 2017-09-09 DIAGNOSIS — J439 Emphysema, unspecified: Secondary | ICD-10-CM | POA: Diagnosis not present

## 2017-09-09 DIAGNOSIS — A09 Infectious gastroenteritis and colitis, unspecified: Secondary | ICD-10-CM | POA: Diagnosis not present

## 2017-09-09 DIAGNOSIS — E785 Hyperlipidemia, unspecified: Secondary | ICD-10-CM | POA: Diagnosis not present

## 2017-09-09 DIAGNOSIS — I5033 Acute on chronic diastolic (congestive) heart failure: Secondary | ICD-10-CM | POA: Diagnosis not present

## 2017-09-09 DIAGNOSIS — L03115 Cellulitis of right lower limb: Secondary | ICD-10-CM | POA: Diagnosis not present

## 2017-09-09 DIAGNOSIS — Z9981 Dependence on supplemental oxygen: Secondary | ICD-10-CM | POA: Diagnosis not present

## 2017-09-09 DIAGNOSIS — Z79891 Long term (current) use of opiate analgesic: Secondary | ICD-10-CM | POA: Diagnosis not present

## 2017-09-09 DIAGNOSIS — I110 Hypertensive heart disease with heart failure: Secondary | ICD-10-CM | POA: Diagnosis not present

## 2017-09-09 DIAGNOSIS — Z7951 Long term (current) use of inhaled steroids: Secondary | ICD-10-CM | POA: Diagnosis not present

## 2017-09-09 DIAGNOSIS — I480 Paroxysmal atrial fibrillation: Secondary | ICD-10-CM | POA: Diagnosis not present

## 2017-09-09 DIAGNOSIS — I351 Nonrheumatic aortic (valve) insufficiency: Secondary | ICD-10-CM | POA: Diagnosis not present

## 2017-09-12 ENCOUNTER — Other Ambulatory Visit: Payer: Self-pay | Admitting: Internal Medicine

## 2017-09-12 DIAGNOSIS — I110 Hypertensive heart disease with heart failure: Secondary | ICD-10-CM | POA: Diagnosis not present

## 2017-09-12 DIAGNOSIS — E039 Hypothyroidism, unspecified: Secondary | ICD-10-CM | POA: Diagnosis not present

## 2017-09-12 DIAGNOSIS — I351 Nonrheumatic aortic (valve) insufficiency: Secondary | ICD-10-CM | POA: Diagnosis not present

## 2017-09-12 DIAGNOSIS — I5033 Acute on chronic diastolic (congestive) heart failure: Secondary | ICD-10-CM | POA: Diagnosis not present

## 2017-09-12 DIAGNOSIS — J439 Emphysema, unspecified: Secondary | ICD-10-CM | POA: Diagnosis not present

## 2017-09-12 DIAGNOSIS — Z79891 Long term (current) use of opiate analgesic: Secondary | ICD-10-CM | POA: Diagnosis not present

## 2017-09-12 DIAGNOSIS — I480 Paroxysmal atrial fibrillation: Secondary | ICD-10-CM | POA: Diagnosis not present

## 2017-09-12 DIAGNOSIS — L03115 Cellulitis of right lower limb: Secondary | ICD-10-CM | POA: Diagnosis not present

## 2017-09-12 DIAGNOSIS — Z7951 Long term (current) use of inhaled steroids: Secondary | ICD-10-CM | POA: Diagnosis not present

## 2017-09-12 DIAGNOSIS — E785 Hyperlipidemia, unspecified: Secondary | ICD-10-CM | POA: Diagnosis not present

## 2017-09-12 DIAGNOSIS — A0472 Enterocolitis due to Clostridium difficile, not specified as recurrent: Secondary | ICD-10-CM

## 2017-09-12 DIAGNOSIS — Z9981 Dependence on supplemental oxygen: Secondary | ICD-10-CM | POA: Diagnosis not present

## 2017-09-12 LAB — GASTROINTESTINAL PATHOGEN PANEL PCR
C. DIFFICILE TOX A/B, PCR: DETECTED — AB
CAMPYLOBACTER, PCR: NOT DETECTED
Cryptosporidium, PCR: NOT DETECTED
E COLI (ETEC) LT/ST, PCR: NOT DETECTED
E COLI 0157, PCR: NOT DETECTED
E coli (STEC) stx1/stx2, PCR: NOT DETECTED
GIARDIA LAMBLIA, PCR: NOT DETECTED
Norovirus, PCR: NOT DETECTED
Rotavirus A, PCR: NOT DETECTED
Salmonella, PCR: NOT DETECTED
Shigella, PCR: NOT DETECTED

## 2017-09-12 LAB — TIQ-NTM

## 2017-09-12 MED ORDER — METRONIDAZOLE 500 MG PO TABS: ORAL_TABLET | ORAL | 0 refills | Status: DC

## 2017-09-13 ENCOUNTER — Emergency Department (HOSPITAL_COMMUNITY): Payer: Medicare Other

## 2017-09-13 ENCOUNTER — Encounter (HOSPITAL_COMMUNITY): Payer: Self-pay | Admitting: Emergency Medicine

## 2017-09-13 ENCOUNTER — Inpatient Hospital Stay (HOSPITAL_COMMUNITY)
Admission: EM | Admit: 2017-09-13 | Discharge: 2017-09-21 | DRG: 291 | Disposition: A | Discharge status: Home / Self Care | Payer: Medicare Other | Attending: Internal Medicine | Admitting: Internal Medicine

## 2017-09-13 ENCOUNTER — Other Ambulatory Visit: Payer: Self-pay

## 2017-09-13 DIAGNOSIS — J439 Emphysema, unspecified: Secondary | ICD-10-CM | POA: Diagnosis present

## 2017-09-13 DIAGNOSIS — I4891 Unspecified atrial fibrillation: Secondary | ICD-10-CM | POA: Diagnosis not present

## 2017-09-13 DIAGNOSIS — K5669 Other partial intestinal obstruction: Secondary | ICD-10-CM | POA: Diagnosis not present

## 2017-09-13 DIAGNOSIS — J90 Pleural effusion, not elsewhere classified: Secondary | ICD-10-CM

## 2017-09-13 DIAGNOSIS — R188 Other ascites: Secondary | ICD-10-CM | POA: Diagnosis not present

## 2017-09-13 DIAGNOSIS — N183 Chronic kidney disease, stage 3 (moderate): Secondary | ICD-10-CM | POA: Diagnosis not present

## 2017-09-13 DIAGNOSIS — Z87891 Personal history of nicotine dependence: Secondary | ICD-10-CM

## 2017-09-13 DIAGNOSIS — R0602 Shortness of breath: Secondary | ICD-10-CM | POA: Diagnosis not present

## 2017-09-13 DIAGNOSIS — E039 Hypothyroidism, unspecified: Secondary | ICD-10-CM | POA: Diagnosis present

## 2017-09-13 DIAGNOSIS — I083 Combined rheumatic disorders of mitral, aortic and tricuspid valves: Secondary | ICD-10-CM | POA: Diagnosis not present

## 2017-09-13 DIAGNOSIS — E43 Unspecified severe protein-calorie malnutrition: Secondary | ICD-10-CM | POA: Diagnosis present

## 2017-09-13 DIAGNOSIS — J9811 Atelectasis: Secondary | ICD-10-CM | POA: Diagnosis not present

## 2017-09-13 DIAGNOSIS — I482 Chronic atrial fibrillation: Secondary | ICD-10-CM | POA: Diagnosis present

## 2017-09-13 DIAGNOSIS — Z90710 Acquired absence of both cervix and uterus: Secondary | ICD-10-CM | POA: Diagnosis not present

## 2017-09-13 DIAGNOSIS — J479 Bronchiectasis, uncomplicated: Secondary | ICD-10-CM | POA: Diagnosis not present

## 2017-09-13 DIAGNOSIS — I351 Nonrheumatic aortic (valve) insufficiency: Secondary | ICD-10-CM | POA: Diagnosis not present

## 2017-09-13 DIAGNOSIS — E860 Dehydration: Secondary | ICD-10-CM | POA: Diagnosis present

## 2017-09-13 DIAGNOSIS — Z79890 Hormone replacement therapy (postmenopausal): Secondary | ICD-10-CM

## 2017-09-13 DIAGNOSIS — A0472 Enterocolitis due to Clostridium difficile, not specified as recurrent: Secondary | ICD-10-CM | POA: Diagnosis not present

## 2017-09-13 DIAGNOSIS — I480 Paroxysmal atrial fibrillation: Secondary | ICD-10-CM | POA: Diagnosis not present

## 2017-09-13 DIAGNOSIS — Z9981 Dependence on supplemental oxygen: Secondary | ICD-10-CM | POA: Diagnosis not present

## 2017-09-13 DIAGNOSIS — I130 Hypertensive heart and chronic kidney disease with heart failure and stage 1 through stage 4 chronic kidney disease, or unspecified chronic kidney disease: Secondary | ICD-10-CM | POA: Diagnosis not present

## 2017-09-13 DIAGNOSIS — R197 Diarrhea, unspecified: Secondary | ICD-10-CM

## 2017-09-13 DIAGNOSIS — I5033 Acute on chronic diastolic (congestive) heart failure: Secondary | ICD-10-CM | POA: Diagnosis present

## 2017-09-13 DIAGNOSIS — I739 Peripheral vascular disease, unspecified: Secondary | ICD-10-CM | POA: Diagnosis present

## 2017-09-13 DIAGNOSIS — A0471 Enterocolitis due to Clostridium difficile, recurrent: Secondary | ICD-10-CM | POA: Diagnosis present

## 2017-09-13 DIAGNOSIS — F419 Anxiety disorder, unspecified: Secondary | ICD-10-CM | POA: Diagnosis present

## 2017-09-13 DIAGNOSIS — N179 Acute kidney failure, unspecified: Secondary | ICD-10-CM | POA: Diagnosis not present

## 2017-09-13 DIAGNOSIS — I481 Persistent atrial fibrillation: Secondary | ICD-10-CM | POA: Diagnosis present

## 2017-09-13 DIAGNOSIS — J9621 Acute and chronic respiratory failure with hypoxia: Secondary | ICD-10-CM

## 2017-09-13 DIAGNOSIS — I509 Heart failure, unspecified: Secondary | ICD-10-CM

## 2017-09-13 DIAGNOSIS — R0902 Hypoxemia: Secondary | ICD-10-CM | POA: Diagnosis not present

## 2017-09-13 DIAGNOSIS — L03115 Cellulitis of right lower limb: Secondary | ICD-10-CM | POA: Diagnosis not present

## 2017-09-13 DIAGNOSIS — Z881 Allergy status to other antibiotic agents status: Secondary | ICD-10-CM

## 2017-09-13 DIAGNOSIS — E785 Hyperlipidemia, unspecified: Secondary | ICD-10-CM | POA: Diagnosis not present

## 2017-09-13 DIAGNOSIS — Z7189 Other specified counseling: Secondary | ICD-10-CM

## 2017-09-13 DIAGNOSIS — R846 Abnormal cytological findings in specimens from respiratory organs and thorax: Secondary | ICD-10-CM | POA: Diagnosis not present

## 2017-09-13 DIAGNOSIS — Z882 Allergy status to sulfonamides status: Secondary | ICD-10-CM

## 2017-09-13 DIAGNOSIS — Z825 Family history of asthma and other chronic lower respiratory diseases: Secondary | ICD-10-CM

## 2017-09-13 DIAGNOSIS — Z8719 Personal history of other diseases of the digestive system: Secondary | ICD-10-CM | POA: Diagnosis present

## 2017-09-13 DIAGNOSIS — Z8249 Family history of ischemic heart disease and other diseases of the circulatory system: Secondary | ICD-10-CM

## 2017-09-13 DIAGNOSIS — Z9889 Other specified postprocedural states: Secondary | ICD-10-CM

## 2017-09-13 DIAGNOSIS — Z833 Family history of diabetes mellitus: Secondary | ICD-10-CM

## 2017-09-13 DIAGNOSIS — Z79891 Long term (current) use of opiate analgesic: Secondary | ICD-10-CM | POA: Diagnosis not present

## 2017-09-13 DIAGNOSIS — I471 Supraventricular tachycardia: Secondary | ICD-10-CM | POA: Diagnosis present

## 2017-09-13 DIAGNOSIS — I110 Hypertensive heart disease with heart failure: Secondary | ICD-10-CM | POA: Diagnosis not present

## 2017-09-13 DIAGNOSIS — Z515 Encounter for palliative care: Secondary | ICD-10-CM

## 2017-09-13 DIAGNOSIS — J9620 Acute and chronic respiratory failure, unspecified whether with hypoxia or hypercapnia: Secondary | ICD-10-CM | POA: Diagnosis not present

## 2017-09-13 DIAGNOSIS — R1110 Vomiting, unspecified: Secondary | ICD-10-CM | POA: Diagnosis not present

## 2017-09-13 DIAGNOSIS — Z7901 Long term (current) use of anticoagulants: Secondary | ICD-10-CM

## 2017-09-13 DIAGNOSIS — Z8701 Personal history of pneumonia (recurrent): Secondary | ICD-10-CM

## 2017-09-13 DIAGNOSIS — I872 Venous insufficiency (chronic) (peripheral): Secondary | ICD-10-CM | POA: Diagnosis present

## 2017-09-13 DIAGNOSIS — J441 Chronic obstructive pulmonary disease with (acute) exacerbation: Secondary | ICD-10-CM | POA: Diagnosis not present

## 2017-09-13 DIAGNOSIS — Z7951 Long term (current) use of inhaled steroids: Secondary | ICD-10-CM | POA: Diagnosis not present

## 2017-09-13 DIAGNOSIS — K56609 Unspecified intestinal obstruction, unspecified as to partial versus complete obstruction: Secondary | ICD-10-CM | POA: Diagnosis not present

## 2017-09-13 DIAGNOSIS — J470 Bronchiectasis with acute lower respiratory infection: Secondary | ICD-10-CM | POA: Diagnosis not present

## 2017-09-13 HISTORY — DX: Hypothyroidism, unspecified: E03.9

## 2017-09-13 LAB — COMPREHENSIVE METABOLIC PANEL
ALT: 17 U/L (ref 14–54)
ANION GAP: 11 (ref 5–15)
AST: 22 U/L (ref 15–41)
Albumin: 3.2 g/dL — ABNORMAL LOW (ref 3.5–5.0)
Alkaline Phosphatase: 70 U/L (ref 38–126)
BUN: 27 mg/dL — ABNORMAL HIGH (ref 6–20)
CALCIUM: 9.2 mg/dL (ref 8.9–10.3)
CHLORIDE: 96 mmol/L — AB (ref 101–111)
CO2: 34 mmol/L — AB (ref 22–32)
Creatinine, Ser: 1.1 mg/dL — ABNORMAL HIGH (ref 0.44–1.00)
GFR calc non Af Amer: 44 mL/min — ABNORMAL LOW (ref >60)
GFR, EST AFRICAN AMERICAN: 51 mL/min — AB (ref >60)
Glucose, Bld: 121 mg/dL — ABNORMAL HIGH (ref 65–99)
POTASSIUM: 4.1 mmol/L (ref 3.5–5.1)
SODIUM: 141 mmol/L (ref 135–145)
Total Bilirubin: 0.8 mg/dL (ref 0.3–1.2)
Total Protein: 7.8 g/dL (ref 6.5–8.1)

## 2017-09-13 LAB — CBC WITH DIFFERENTIAL/PLATELET
Basophils Absolute: 0 10*3/uL (ref 0.0–0.1)
Basophils Relative: 0 %
EOS ABS: 0 10*3/uL (ref 0.0–0.7)
EOS PCT: 0 %
HCT: 40.6 % (ref 36.0–46.0)
Hemoglobin: 11.8 g/dL — ABNORMAL LOW (ref 12.0–15.0)
LYMPHS ABS: 1.5 10*3/uL (ref 0.7–4.0)
Lymphocytes Relative: 9 %
MCH: 27.6 pg (ref 26.0–34.0)
MCHC: 29.1 g/dL — AB (ref 30.0–36.0)
MCV: 94.9 fL (ref 78.0–100.0)
MONOS PCT: 7 %
Monocytes Absolute: 1.3 10*3/uL — ABNORMAL HIGH (ref 0.1–1.0)
Neutro Abs: 14.4 10*3/uL — ABNORMAL HIGH (ref 1.7–7.7)
Neutrophils Relative %: 84 %
PLATELETS: 385 10*3/uL (ref 150–400)
RBC: 4.28 MIL/uL (ref 3.87–5.11)
RDW: 14.4 % (ref 11.5–15.5)
WBC: 17.1 10*3/uL — ABNORMAL HIGH (ref 4.0–10.5)

## 2017-09-13 LAB — LACTIC ACID, PLASMA
LACTIC ACID, VENOUS: 1.2 mmol/L (ref 0.5–1.9)
Lactic Acid, Venous: 1.3 mmol/L (ref 0.5–1.9)

## 2017-09-13 LAB — URINALYSIS, ROUTINE W REFLEX MICROSCOPIC
Bacteria, UA: NONE SEEN
Bilirubin Urine: NEGATIVE
GLUCOSE, UA: NEGATIVE mg/dL
HGB URINE DIPSTICK: NEGATIVE
Ketones, ur: NEGATIVE mg/dL
NITRITE: NEGATIVE
PH: 6 (ref 5.0–8.0)
Protein, ur: 30 mg/dL — AB
SPECIFIC GRAVITY, URINE: 1.01 (ref 1.005–1.030)

## 2017-09-13 LAB — TROPONIN I: Troponin I: <0.03 ng/mL (ref <0.03)

## 2017-09-13 MED ORDER — ONDANSETRON HCL 4 MG PO TABS
4.0000 mg | ORAL_TABLET | Freq: Four times a day (QID) | ORAL | Status: DC | PRN
  Filled 2017-09-13: qty 1

## 2017-09-13 MED ORDER — DIATRIZOATE MEGLUMINE & SODIUM 66-10 % PO SOLN
ORAL | Status: AC
  Filled 2017-09-13: qty 30

## 2017-09-13 MED ORDER — ONDANSETRON HCL 4 MG/2ML IJ SOLN
2.0000 mg | Freq: Once | INTRAMUSCULAR | Status: AC
  Administered 2017-09-13: 2 mg via INTRAVENOUS
  Filled 2017-09-13: qty 2

## 2017-09-13 MED ORDER — IOPAMIDOL (ISOVUE-300) INJECTION 61%
100.0000 mL | Freq: Once | INTRAVENOUS | Status: AC | PRN
  Administered 2017-09-13: 100 mL via INTRAVENOUS

## 2017-09-13 MED ORDER — SODIUM CHLORIDE 0.9 % IV BOLUS (SEPSIS)
1000.0000 mL | Freq: Once | INTRAVENOUS | Status: AC
  Administered 2017-09-13: 1000 mL via INTRAVENOUS

## 2017-09-13 MED ORDER — ONDANSETRON HCL 4 MG/2ML IJ SOLN
4.0000 mg | Freq: Four times a day (QID) | INTRAMUSCULAR | Status: DC | PRN
  Administered 2017-09-13: 4 mg via INTRAVENOUS
  Filled 2017-09-13: qty 2

## 2017-09-13 MED ORDER — DEXTROSE 5 % IV SOLN
1.0000 g | Freq: Once | INTRAVENOUS | Status: AC
  Administered 2017-09-13: 1 g via INTRAVENOUS
  Filled 2017-09-13: qty 10

## 2017-09-13 MED ORDER — LORAZEPAM 2 MG/ML IJ SOLN
0.5000 mg | Freq: Once | INTRAMUSCULAR | Status: AC
  Administered 2017-09-13: 0.5 mg via INTRAVENOUS
  Filled 2017-09-13: qty 1

## 2017-09-13 NOTE — H&P (Signed)
TRH H&P    Patient Demographics:    Andrea [name], is a 83 y.o. female  MRN: [id number]  DOB - [birth year]  Admit Date - 09/13/2017  Referring MD/NP/PA: Dr. Lacinda Axon  Outpatient Primary MD for the patient is Unk Pinto, MD  Patient coming from: home  Chief Complaint  Patient presents with  . Shortness of Breath      HPI:    Andrea [name]  is a 83 y.o. female, with history of aortic regurgitation, COPD on chronic home will do at bedtime, hyperlipidemia, chronic diastolic CHF, paroxysmal atrial fibrillation on anti-coagulation with eliquis, recent treatment for C diff colitis, recurrent pleural effusion requiring thoracentesis was brought to hospital for shortness of breath. Patient also has been having nausea with vomiting and abdominal distention.  She was recently treated for city of colitis with PO Flagyl. Patient has been off Flagyl for a week. Recent stool sample at PCP office to sample again noted for positive for C. Diff.  In the ED, CT scan of the abdomen shows partial small bowel obstruction involving the ileum. Again shows bilateral pleural effusion right more than left.  Patient denies chest pain, denies shortness of breath at this time. She is currently on oxygen O2 sats greater than 90%. She  complains of abdominal pain denies dysuria, urgency or frequency of urination.    Review of systems:      All other systems reviewed and are negative.   With Past History of the following :    Past Medical History:  Diagnosis Date  . Aortic insufficiency 09/04/2015  . Aortic regurgitation   . Bronchiectasis ([place])   . COPD (chronic obstructive pulmonary disease) ([place])   . DJD (degenerative joint disease)   . HLD (hyperlipidemia)   . HTN (hypertension)   . Mild mitral regurgitation by prior echocardiogram   . Mild mitral regurgitation by prior echocardiogram   . Mild tricuspid regurgitation     . Palpitations    a. has known history of PAC's and PVC's. b. 09/2015: monitor showed sinus rhythm with occasional PAC's and a brief episode of PAT.  [name] Thyroid disease       Past Surgical History:  Procedure Laterality Date  . ABDOMINAL HYSTERECTOMY  1993  . CATARACT EXTRACTION, BILATERAL    . WOUND DEBRIDEMENT Right 06/29/2017   Procedure: RIGHT LOWER  LEG DEBRIDEMENT;  Surgeon: [name], MD;  Location: [facility];  Service: General;  Laterality: Right;      Social History:      Social History   Tobacco Use  . Smoking status: Former Smoker    Packs/day: 1.00    Years: 20.00    Pack years: 20.00    Types: Cigarettes    Last attempt to quit: 09/27/1974    Years since quitting: 42.9  . Smokeless tobacco: Never Used  Substance Use Topics  . Alcohol use: No       Family History :     Family History  Problem Relation Age of Onset  . Emphysema Mother   .  Diabetes Mother   . Cancer Father   . Emphysema Sister   . Emphysema Sister   . Rheumatic fever Sister   . Heart attack Sister   . Diabetes Sister   . Diabetes Brother   . Asthma Brother        as a child      Home Medications:   Prior to Admission medications   Medication Sig Start Date End Date Taking? Authorizing Provider  ALPRAZolam (XANAX) 0.25 MG tablet Take 0.125-0.25 mg by mouth at bedtime.    Yes [provider]  apixaban (ELIQUIS) 2.5 MG TABS tablet Take 1 tablet (2.5 mg total) by mouth 2 (two) times daily. 05/26/17  Yes Lelon Perla, MD  diltiazem (CARDIZEM) 60 MG tablet Take 60 mg by mouth 4 (four) times daily.  05/31/17  Yes [provider]  furosemide (LASIX) 20 MG tablet Take 1 tablet (20 mg total) by mouth daily. 09/01/17  Yes Dhungel, Nishant, MD  levothyroxine (SYNTHROID, LEVOTHROID) 50 MCG tablet TAKE ONE TABLET BY MOUTH ONCE A DAY AS DIRECTED 08/09/17  Yes Unk Pinto, MD  Metoprolol Tartrate 37.5 MG TABS Take 37.5 mg by mouth 2 (two) times daily. 05/31/17  Yes  Lelon Perla, MD  Probiotic Product (PROBIOTIC-10) CHEW Chew 2 tablets by mouth daily.   Yes [provider]  metroNIDAZOLE (FLAGYL) 500 MG tablet Take 1 tablet 3 x / day after meals for infection Patient not taking: Reported on 09/13/2017 09/12/17 09/26/17  Unk Pinto, MD  silver sulfADIAZINE (SILVADENE) 1 % cream Apply to affected area daily Patient not taking: Reported on 09/13/2017 07/14/17 07/14/18  [name], MD  traMADol (ULTRAM) 50 MG tablet Take 1 tablet (50 mg total) by mouth every 6 (six) hours as needed. 07/07/17   [name], MD     Allergies:     Allergies  Allergen Reactions  . Vancomycin     Worsening kidney function  . Sulfa Antibiotics Palpitations    Unknown     Physical Exam:   Vitals  Blood pressure (!) 108/48, pulse (!) 52, temperature 97.9 F (36.6 C), temperature source Oral, resp. rate 18, height _0  (1.6 m), weight 47.6 kg (105 lb), SpO2 90 %.  1.  General: appears dehydrated  2. Psychiatric:  Intact judgement and  insight, awake alert, oriented x 3.  3. Neurologic: No focal neurological deficits, all cranial nerves intact.Strength 5/5 all 4 extremities, sensation intact all 4 extremities, plantars down going.  4. Eyes :  anicteric sclerae, moist conjunctivae with no lid lag. PERRLA.  5. ENMT:  Oropharynx clear with moist mucous membranes and good dentition  6. Neck:  supple, no cervical lymphadenopathy appriciated, No thyromegaly  7. Respiratory : Normal respiratory effort, good air movement bilaterally,clear to  auscultation bilaterally  8. Cardiovascular : RRR, no gallops, rubs or murmurs, no leg edema  9. Gastrointestinal:  Positive bowel sounds, abdomen soft, mild generalized under the probation, abdominal distention, no hepatosplenomegaly, no rigidity or guarding       10. Skin:  the right lower extremity  In dressing  11.Musculoskeletal:  Good muscle tone,  joints appear normal , no  effusions,  normal range of motion    Data Review:    CBC Recent Labs  Lab 09/08/17 1502 09/13/17 1638  WBC 11.3* 17.1*  HGB 10.9* 11.8*  HCT 34.2* 40.6  PLT 340 385  MCV 89.8 94.9  MCH 28.6 27.6  MCHC 31.9* 29.1*  RDW 12.3 14.4  LYMPHSABS 1,661 1.5  MONOABS  --  1.3*  EOSABS 45 0.0  BASOSABS 45 0.0   ------------------------------------------------------------------------------------------------------------------  Chemistries  Recent Labs  Lab 09/08/17 1502 09/13/17 1638  NA 140 141  K 4.3 4.1  CL 95* 96*  CO2 39* 34*  GLUCOSE 99 121*  BUN 33* 27*  CREATININE 1.20* 1.10*  CALCIUM 9.2 9.2  AST  --  22  ALT  --  17  ALKPHOS  --  70  BILITOT  --  0.8   ------------------------------------------------------------------------------------------------------------------  ------------------------------------------------------------------------------------------------------------------ GFR: Estimated Creatinine Clearance: 27.6 mL/min (A) (by C-G formula based on SCr of 1.1 mg/dL (H)). Liver Function Tests: Recent Labs  Lab 09/13/17 1638  AST 22  ALT 17  ALKPHOS 70  BILITOT 0.8  PROT 7.8  ALBUMIN 3.2*   No results for input(s): LIPASE, AMYLASE in the last 168 hours. No results for input(s): AMMONIA in the last 168 hours. Coagulation Profile: No results for input(s): INR, PROTIME in the last 168 hours. Cardiac Enzymes: Recent Labs  Lab 09/13/17 1638  TROPONINI <0.03     --------------------------------------------------------------------------------------------------------------- Urine analysis:    Component Value Date/Time   COLORURINE YELLOW 09/13/2017 [place] 09/13/2017 1647   LABSPEC 1.010 09/13/2017 1647   PHURINE 6.0 09/13/2017 1647   GLUCOSEU NEGATIVE 09/13/2017 1647   HGBUR NEGATIVE 09/13/2017 1647   BILIRUBINUR NEGATIVE 09/13/2017 1647   KETONESUR NEGATIVE 09/13/2017 1647   PROTEINUR 30 (A) 09/13/2017 1647    UROBILINOGEN 0.2 08/20/2014 1613   NITRITE NEGATIVE 09/13/2017 1647   LEUKOCYTESUR TRACE (A) 09/13/2017 1647      Imaging Results:    Ct Abdomen Pelvis W Contrast  Result Date: 09/13/2017 CLINICAL DATA:  Abdominal distension with vomiting and diarrhea. Shortness of breath. EXAM: CT ABDOMEN AND PELVIS WITH CONTRAST TECHNIQUE: Multidetector CT imaging of the abdomen and pelvis was performed using the standard protocol following bolus administration of intravenous contrast. CONTRAST:  170m ISOVUE-300 IOPAMIDOL (ISOVUE-300) INJECTION 61% COMPARISON:  08/19/2017 FINDINGS: Lower chest: Emphysema. Persistent bibasilar airspace disease. Mild cardiomegaly with LAD coronary artery atherosclerosis. Moderate bilateral pleural effusions, larger on the left. Similar. Hepatobiliary: Perfusion anomaly involving the anterior left hepatic lobe image 33/ series 2. Normal gallbladder, without biliary ductal dilatation. Pancreas: Normal, without mass or ductal dilatation. Spleen: Normal in size, without focal abnormality. Adrenals/Urinary Tract: Normal right adrenal gland. Minimal left adrenal thickening and nodularity. Bilateral renal cortical thinning. Right renal lesions are favored to represent cysts or minimally complex cysts. Bilateral too small to characterize renal lesions. No hydronephrosis. Normal urinary bladder. Stomach/Bowel: Normal stomach, without wall thickening. Scattered colonic diverticula. The colon is relatively decompressed. Small bowel loops are fluid-filled and dilated, including at up to 5.3 cm. A transition point is identified within the mid to distal ileum, including on images 73 through 77/series 2. No bowel wall thickening to suggest complicating ischemia. No pneumatosis or free intraperitoneal air. Vascular/Lymphatic: Aortic and branch vessel atherosclerosis. No abdominopelvic adenopathy. Reproductive: Hysterectomy. Other: Small volume cul-de-sac fluid including on 77/series 2. Pelvic floor  laxity. Musculoskeletal: Osteopenia.  Convex right lumbar spine curvature. IMPRESSION: 1. High-grade partial small bowel obstruction involving the mid to distal ileum. Likely due to adhesions. Although there is adjacent ascites, no specific evidence of complicating ischemia identified. 2. Bilateral pleural effusions and bibasilar atelectasis, as before. 3. Coronary artery atherosclerosis. Aortic Atherosclerosis (ICD10-I70.0). 4. Pelvic floor laxity. Electronically Signed   By: KAbigail MiyamotoM.D.   On: 09/13/2017 20:47   Dg Chest Portable 1 View  Result Date: 09/13/2017 CLINICAL DATA:  Shortness of breath.  Vomiting and diarrhea. EXAM: PORTABLE CHEST 1 VIEW COMPARISON:  09/01/2017 FINDINGS: Small to moderate bilateral pleural effusions, increased. Diffuse interstitial and airspace opacity, increased. Probable cardiomegaly, although partially obscured. No pneumothorax. IMPRESSION: CHF pattern including small to moderate pleural effusions. Electronically Signed   By: Monte Fantasia M.D.   On: 09/13/2017 17:53    My personal review of EKG: Rhythm - atrial fibrillation with RVR   Assessment & Plan:    Active Problems:   SBO (small bowel obstruction) ([place])   1. Partial small bowel obstruction- CT scan shows high-grade partial small obstruction involving the mid to distal ileum, likely from underlying adhesions. Patient had a hysterectomy in the past. Will keep her NPO. She is not vomiting at this time so will avoid NG tube placement. Will repeat abdominal x-ray and a.m. Consult general surgery in a.m. 2. Recurrent C diff infection- stool studies done at PCP office positive for C. diff. Start IV Flagyl 500 mg Q8 hours. WBC elevated to 17,000. Follow CBC in a.m. Enteric precautions 3. Chronic diastolic CHF/bilateral pleural effusion's-chronic pleural effusion's, CHF. Currently well compensated. Will hold Lasix. Patient started on gentle IV hydration with normal insulin 50 ML per hour due to SBO. Will  closely monitor patients intake and output. 4. Hypothyroidism- hold PO central, start half dose of Synthroid at 25 g IV daily 5. Atrial fibrillation with RVR-heart rate is controlled at this time. Will start metoprolol 2.5 mg IV Q8 hours. Due to soft blood pressure, but will be careful with high-dose metoprolol. Patient takes  eliquis at home, will start Lovenox per pharmacy consultation. Hold PO Cardizem at this time.   DVT Prophylaxis-   Lovenox  AM Labs Ordered, also please review Full Orders  Family Communication: Admission, patients condition and plan of care including tests being ordered have been discussed with the patient and her daughters at bedside who indicate understanding and agree with the plan and Code Status.  Code Status: full code   Admission status: inpatient  Time spent in minutes : 60 minutes   Oswald Hillock M.D on 09/13/2017 at 10:54 PM  Between 7am to 7pm - Pager - [phone]. After 7pm go to [email or web address] - password [facility]  [facility] - Office  [phone]

## 2017-09-13 NOTE — ED Provider Notes (Signed)
[facility] EMERGENCY DEPARTMENT Provider Note   CSN: [id number] Arrival date & time: 09/13/17  1605     History   Chief Complaint Chief Complaint  Patient presents with  . Shortness of Breath    HPI Andrea [name] is a 83 y.o. female.  Level 5 caveat for urgent need for intervention.  Patient presents with dyspnea, diarrhea, abdominal distention since this morning.  She had recently been diagnosed with C. difficile and Rxed with Flagyl.  She has been off the medication for 7-10 days.  Recent stool sample was noted today to be positive again for C. difficile.  Patient has had a recent small bowel obstruction.  She has other multiple health problems well documented in the past medical history      Past Medical History:  Diagnosis Date  . Aortic insufficiency 09/04/2015  . Aortic regurgitation   . Bronchiectasis ([place])   . COPD (chronic obstructive pulmonary disease) ([place])   . DJD (degenerative joint disease)   . HLD (hyperlipidemia)   . HTN (hypertension)   . Mild mitral regurgitation by prior echocardiogram   . Mild mitral regurgitation by prior echocardiogram   . Mild tricuspid regurgitation   . Palpitations    a. has known history of PAC's and PVC's. b. 09/2015: monitor showed sinus rhythm with occasional PAC's and a brief episode of PAT.  [name] Thyroid disease     Patient Active Problem List   Diagnosis Date Noted  . Acute on chronic diastolic CHF (congestive heart failure) ([place]) 09/01/2017  . Pleural effusion 08/26/2017  . Traumatic open wound of right lower leg with infection   . Cellulitis of right lower extremity 06/27/2017  . Aortic atherosclerosis ([place]) 06/22/2017  . Anterior basement membrane dystrophy 04/27/2017  . Anterior corneal dystrophy 04/27/2017  . Protein-calorie malnutrition, severe ([place]) 04/08/2017  . AF (paroxysmal atrial fibrillation) ([place]) 04/03/2017  . Atrial tachycardia ([place]) 04/02/2017  . Chronic respiratory failure with hypoxia ([place])  03/31/2017  . Pulmonary hypertension due to COPD ([place]) 10/04/2016  . Renal insufficiency 04/19/2016  . Skin cancer 09/25/2015  . Hypothyroidism 09/25/2015  . Osteoporosis 09/25/2015  . Vitamin D deficiency 01/23/2014  . Medication management 01/23/2014  . Hyperlipidemia 08/10/2013  . Bilateral dry eyes 09/08/2011  . Exposure keratopathy 09/08/2011  . DJD (degenerative joint disease)   . Essential hypertension 05/08/2010  . COPD with emphysema ([place]) 05/08/2010  . Lung nodules 05/08/2010    Past Surgical History:  Procedure Laterality Date  . ABDOMINAL HYSTERECTOMY  1993  . CATARACT EXTRACTION, BILATERAL    . WOUND DEBRIDEMENT Right 06/29/2017   Procedure: RIGHT LOWER  LEG DEBRIDEMENT;  Surgeon: [name], MD;  Location: [facility];  Service: General;  Laterality: Right;    OB History    No data available       Home Medications    Prior to Admission medications   Medication Sig Start Date End Date Taking? Authorizing Provider  ALPRAZolam (XANAX) 0.25 MG tablet Take 0.125-0.25 mg by mouth at bedtime.    Yes [provider]  apixaban (ELIQUIS) 2.5 MG TABS tablet Take 1 tablet (2.5 mg total) by mouth 2 (two) times daily. 05/26/17  Yes Lelon Perla, MD  diltiazem (CARDIZEM) 60 MG tablet Take 60 mg by mouth 4 (four) times daily.  05/31/17  Yes [provider]  furosemide (LASIX) 20 MG tablet Take 1 tablet (20 mg total) by mouth daily. 09/01/17  Yes Dhungel, Nishant, MD  levothyroxine (SYNTHROID, LEVOTHROID) 50 MCG  tablet TAKE ONE TABLET BY MOUTH ONCE A DAY AS DIRECTED 08/09/17  Yes Unk Pinto, MD  Metoprolol Tartrate 37.5 MG TABS Take 37.5 mg by mouth 2 (two) times daily. 05/31/17  Yes Lelon Perla, MD  Probiotic Product (PROBIOTIC-10) CHEW Chew 2 tablets by mouth daily.   Yes [provider]  metroNIDAZOLE (FLAGYL) 500 MG tablet Take 1 tablet 3 x / day after meals for infection Patient not taking: Reported on 09/13/2017 09/12/17 09/26/17   Unk Pinto, MD  silver sulfADIAZINE (SILVADENE) 1 % cream Apply to affected area daily Patient not taking: Reported on 09/13/2017 07/14/17 07/14/18  [name], MD  traMADol (ULTRAM) 50 MG tablet Take 1 tablet (50 mg total) by mouth every 6 (six) hours as needed. 07/07/17   [name], MD    Family History Family History  Problem Relation Age of Onset  . Emphysema Mother   . Diabetes Mother   . Cancer Father   . Emphysema Sister   . Emphysema Sister   . Rheumatic fever Sister   . Heart attack Sister   . Diabetes Sister   . Diabetes Brother   . Asthma Brother        as a child    Social History Social History   Tobacco Use  . Smoking status: Former Smoker    Packs/day: 1.00    Years: 20.00    Pack years: 20.00    Types: Cigarettes    Last attempt to quit: 09/27/1974    Years since quitting: 42.9  . Smokeless tobacco: Never Used  Substance Use Topics  . Alcohol use: No  . Drug use: No     Allergies   Vancomycin and Sulfa antibiotics   Review of Systems Review of Systems  Unable to perform ROS: Acuity of condition     Physical Exam Updated Vital Signs BP 114/66   Pulse 89   Temp 97.9 F (36.6 C) (Oral)   Resp (!) 21   Ht _0  (1.6 m)   Wt 47.6 kg (105 lb)   SpO2 95%   BMI 18.60 kg/m   Physical Exam  Constitutional: She is oriented to person, place, and time.  Pale, tachypneic  HENT:  Head: Normocephalic and atraumatic.  Eyes: Conjunctivae are normal.  Neck: Neck supple.  Cardiovascular: Normal rate and regular rhythm.  Pulmonary/Chest: Effort normal and breath sounds normal.  Abdominal:  Abdomen distended; decreased bowel sounds  Musculoskeletal: Normal range of motion.  Neurological: She is alert and oriented to person, place, and time.  Skin: Skin is warm and dry.  Psychiatric: She has a normal mood and affect. Her behavior is normal.  Nursing note and vitals reviewed.    ED Treatments / Results  Labs (all labs  ordered are listed, but only abnormal results are displayed) Labs Reviewed  CBC WITH DIFFERENTIAL/PLATELET - Abnormal; Notable for the following components:      Result Value   WBC 17.1 (*)    Hemoglobin 11.8 (*)    MCHC 29.1 (*)    Neutro Abs 14.4 (*)    Monocytes Absolute 1.3 (*)    All other components within normal limits  COMPREHENSIVE METABOLIC PANEL - Abnormal; Notable for the following components:   Chloride 96 (*)    CO2 34 (*)    Glucose, Bld 121 (*)    BUN 27 (*)    Creatinine, Ser 1.10 (*)    Albumin 3.2 (*)    GFR calc non Af Wyvonnia Lora  44 (*)    GFR calc Af Amer 51 (*)    All other components within normal limits  URINALYSIS, ROUTINE W REFLEX MICROSCOPIC - Abnormal; Notable for the following components:   Protein, ur 30 (*)    Leukocytes, UA TRACE (*)    Squamous Epithelial / LPF 0-5 (*)    All other components within normal limits  CULTURE, BLOOD (ROUTINE X 2)  CULTURE, BLOOD (ROUTINE X 2)  LACTIC ACID, PLASMA  LACTIC ACID, PLASMA  TROPONIN I    EKG  EKG Interpretation None       Radiology Ct Abdomen Pelvis W Contrast  Result Date: 09/13/2017 CLINICAL DATA:  Abdominal distension with vomiting and diarrhea. Shortness of breath. EXAM: CT ABDOMEN AND PELVIS WITH CONTRAST TECHNIQUE: Multidetector CT imaging of the abdomen and pelvis was performed using the standard protocol following bolus administration of intravenous contrast. CONTRAST:  130m ISOVUE-300 IOPAMIDOL (ISOVUE-300) INJECTION 61% COMPARISON:  08/19/2017 FINDINGS: Lower chest: Emphysema. Persistent bibasilar airspace disease. Mild cardiomegaly with LAD coronary artery atherosclerosis. Moderate bilateral pleural effusions, larger on the left. Similar. Hepatobiliary: Perfusion anomaly involving the anterior left hepatic lobe image 33/ series 2. Normal gallbladder, without biliary ductal dilatation. Pancreas: Normal, without mass or ductal dilatation. Spleen: Normal in size, without focal abnormality.  Adrenals/Urinary Tract: Normal right adrenal gland. Minimal left adrenal thickening and nodularity. Bilateral renal cortical thinning. Right renal lesions are favored to represent cysts or minimally complex cysts. Bilateral too small to characterize renal lesions. No hydronephrosis. Normal urinary bladder. Stomach/Bowel: Normal stomach, without wall thickening. Scattered colonic diverticula. The colon is relatively decompressed. Small bowel loops are fluid-filled and dilated, including at up to 5.3 cm. A transition point is identified within the mid to distal ileum, including on images 73 through 77/series 2. No bowel wall thickening to suggest complicating ischemia. No pneumatosis or free intraperitoneal air. Vascular/Lymphatic: Aortic and branch vessel atherosclerosis. No abdominopelvic adenopathy. Reproductive: Hysterectomy. Other: Small volume cul-de-sac fluid including on 77/series 2. Pelvic floor laxity. Musculoskeletal: Osteopenia.  Convex right lumbar spine curvature. IMPRESSION: 1. High-grade partial small bowel obstruction involving the mid to distal ileum. Likely due to adhesions. Although there is adjacent ascites, no specific evidence of complicating ischemia identified. 2. Bilateral pleural effusions and bibasilar atelectasis, as before. 3. Coronary artery atherosclerosis. Aortic Atherosclerosis (ICD10-I70.0). 4. Pelvic floor laxity. Electronically Signed   By: KAbigail MiyamotoM.D.   On: 09/13/2017 20:47   Dg Chest Portable 1 View  Result Date: 09/13/2017 CLINICAL DATA:  Shortness of breath.  Vomiting and diarrhea. EXAM: PORTABLE CHEST 1 VIEW COMPARISON:  09/01/2017 FINDINGS: Small to moderate bilateral pleural effusions, increased. Diffuse interstitial and airspace opacity, increased. Probable cardiomegaly, although partially obscured. No pneumothorax. IMPRESSION: CHF pattern including small to moderate pleural effusions. Electronically Signed   By: JMonte FantasiaM.D.   On: 09/13/2017 17:53     Procedures Procedures (including critical care time)  Medications Ordered in ED Medications  diatrizoate meglumine-sodium (GASTROGRAFIN) 66-10 % solution (not administered)  sodium chloride 0.9 % bolus 1,000 mL (0 mLs Intravenous Stopped 09/13/17 1730)  ondansetron (ZOFRAN) injection 2 mg (2 mg Intravenous Given 09/13/17 1812)  cefTRIAXone (ROCEPHIN) 1 g in dextrose 5 % 50 mL IVPB (0 g Intravenous Stopped 09/13/17 1922)  iopamidol (ISOVUE-300) 61 % injection 100 mL (100 mLs Intravenous Contrast Given 09/13/17 2021)     Initial Impression / Assessment and Plan / ED Course  I have reviewed the triage vital signs and the nursing notes.  Pertinent labs & imaging  results that were available during my care of the patient were reviewed by me and considered in my medical decision making (see chart for details).     Patient presents with dyspnea, diarrhea, abdominal distention.  My initial impression was dehydration secondary to fluid loss.  She was given a fluid bolus.  Chest x-ray reveals congestive heart failure.  Additionally, CT of abdomen/pelvis shows a small bowel obstruction in the mid to distal ileum.  She is hemodynamically stable.  Admit to general medicine.  Discussed with Dr. Darrick Meigs  Final Clinical Impressions(s) / ED Diagnoses   Final diagnoses:  SBO (small bowel obstruction) (H[facility])  Diarrhea, unspecified type  C. difficile colitis  Congestive heart failure, unspecified HF chronicity, unspecified heart failure type [facility])    ED Discharge Orders    None       Nat Christen, MD 09/13/17 2121

## 2017-09-13 NOTE — Progress Notes (Deleted)
HPI: FU AI, PAF, palpitations and hypertension. Nuclear study in October 2009 revealed an ejection fraction of 73% and normal perfusion. CardioNet in February of 2014 showed sinus rhythm with PACs, PVCs, occasional junctional escape beats and pauses greater than 3 seconds. Cardizem was discontinued and Norvasc added.  Patient had lower extremity cellulitis October 2018 treated with antibiotics.  Last echocardiogram November 2018 showed normal LV function, moderate diastolic dysfunction, mild aortic and mitral regurgitation, moderate tricuspid regurgitation with moderate pulmonary hypertension and small pericardial effusion.  Patient admitted with bilateral pleural effusions November 2018.  She had left-sided thoracentesis which was transudate of and cytology negative.  She was also placed on Lasix with improvement in symptoms.  Also with atrial fibrillation treated with Cardizem and metoprolol.  Since last seen,   Current Outpatient Medications  Medication Sig Dispense Refill  . ALPRAZolam (XANAX) 0.25 MG tablet Take 0.125-0.25 mg by mouth at bedtime.     [name] apixaban (ELIQUIS) 2.5 MG TABS tablet Take 1 tablet (2.5 mg total) by mouth 2 (two) times daily. 180 tablet 3  . Ascorbic Acid (VITAMIN C) 500 MG tablet Take 500 mg by mouth daily.      . cholecalciferol (VITAMIN D) 1000 UNITS tablet Take 1,000 Units by mouth daily.     [name] diltiazem (CARDIZEM) 90 MG tablet Take 90 mg by mouth 4 (four) times daily.    . feeding supplement, ENSURE ENLIVE, (ENSURE ENLIVE) LIQD Take 237 mLs by mouth 2 (two) times daily between meals. 237 mL 12  . furosemide (LASIX) 20 MG tablet Take 1 tablet (20 mg total) by mouth daily. 30 tablet 0  . levothyroxine (SYNTHROID, LEVOTHROID) 50 MCG tablet TAKE ONE TABLET BY MOUTH ONCE A DAY AS DIRECTED 90 tablet 1  . Metoprolol Tartrate 37.5 MG TABS Take 37.5 mg by mouth 2 (two) times daily. 180 tablet 3  . metroNIDAZOLE (FLAGYL) 500 MG tablet Take 1 tablet 3 x / day after meals  for infection 42 tablet 0  . Probiotic Product (PROBIOTIC-10) CHEW Chew 2 tablets by mouth daily.    . silver sulfADIAZINE (SILVADENE) 1 % cream Apply to affected area daily 50 g 1  . traMADol (ULTRAM) 50 MG tablet Take 1 tablet (50 mg total) by mouth every 6 (six) hours as needed. 20 tablet 0   No current facility-administered medications for this visit.      Past Medical History:  Diagnosis Date  . Aortic insufficiency 09/04/2015  . Aortic regurgitation   . Bronchiectasis ([place])   . COPD (chronic obstructive pulmonary disease) ([place])   . DJD (degenerative joint disease)   . HLD (hyperlipidemia)   . HTN (hypertension)   . Mild mitral regurgitation by prior echocardiogram   . Mild mitral regurgitation by prior echocardiogram   . Mild tricuspid regurgitation   . Palpitations    a. has known history of PAC's and PVC's. b. 09/2015: monitor showed sinus rhythm with occasional PAC's and a brief episode of PAT.  [name] Thyroid disease     Past Surgical History:  Procedure Laterality Date  . ABDOMINAL HYSTERECTOMY  1993  . CATARACT EXTRACTION, BILATERAL    . WOUND DEBRIDEMENT Right 06/29/2017   Procedure: RIGHT LOWER  LEG DEBRIDEMENT;  Surgeon: [name], MD;  Location: [facility];  Service: General;  Laterality: Right;    Social History   Socioeconomic History  . Marital status: Married    Spouse name: Not on file  . Number of children: 5  .  Years of education: Not on file  . Highest education level: Not on file  Social Needs  . Financial resource strain: Not on file  . Food insecurity - worry: Not on file  . Food insecurity - inability: Not on file  . Transportation needs - medical: Not on file  . Transportation needs - non-medical: Not on file  Occupational History  . Occupation: retired  Tobacco Use  . Smoking status: Former Smoker    Packs/day: 1.00    Years: 20.00    Pack years: 20.00    Types: Cigarettes    Last attempt to quit: 09/27/1974    Years since quitting:  42.9  . Smokeless tobacco: Never Used  Substance and Sexual Activity  . Alcohol use: No  . Drug use: No  . Sexual activity: No  Other Topics Concern  . Not on file  Social History Narrative  . Not on file    Family History  Problem Relation Age of [place]set  . Emphysema Mother   . Diabetes Mother   . Cancer Father   . Emphysema Sister   . Emphysema Sister   . Rheumatic fever Sister   . Heart attack Sister   . Diabetes Sister   . Diabetes Brother   . Asthma Brother        as a child    ROS: no fevers or chills, productive cough, hemoptysis, dysphasia, odynophagia, melena, hematochezia, dysuria, hematuria, rash, seizure activity, orthopnea, PND, pedal edema, claudication. Remaining systems are negative.  Physical Exam: Well-developed well-nourished in no acute distress.  Skin is warm and dry.  HEENT is normal.  Neck is supple.  Chest is clear to auscultation with normal expansion.  Cardiovascular exam is regular rate and rhythm.  Abdominal exam nontender or distended. No masses palpated. Extremities show no edema. neuro grossly intact  ECG- personally reviewed  A/P  1  Kirk Ruths, MD

## 2017-09-13 NOTE — ED Triage Notes (Signed)
PT's daughter reports pt got dx with CDiff today but hasn't started her antibiotics yet. PT reports abdominal distention with vomiting and diarrhea that started back a few days ago. PT also reports SOB and low oxygen level at home today when home health nurse evaluated here.

## 2017-09-14 ENCOUNTER — Inpatient Hospital Stay (HOSPITAL_COMMUNITY): Payer: Medicare Other

## 2017-09-14 ENCOUNTER — Encounter (HOSPITAL_COMMUNITY): Payer: Self-pay | Admitting: Primary Care

## 2017-09-14 DIAGNOSIS — K56609 Unspecified intestinal obstruction, unspecified as to partial versus complete obstruction: Secondary | ICD-10-CM

## 2017-09-14 DIAGNOSIS — I5033 Acute on chronic diastolic (congestive) heart failure: Secondary | ICD-10-CM

## 2017-09-14 DIAGNOSIS — I480 Paroxysmal atrial fibrillation: Secondary | ICD-10-CM

## 2017-09-14 DIAGNOSIS — J9620 Acute and chronic respiratory failure, unspecified whether with hypoxia or hypercapnia: Secondary | ICD-10-CM

## 2017-09-14 DIAGNOSIS — J479 Bronchiectasis, uncomplicated: Secondary | ICD-10-CM

## 2017-09-14 DIAGNOSIS — I4891 Unspecified atrial fibrillation: Secondary | ICD-10-CM

## 2017-09-14 DIAGNOSIS — I509 Heart failure, unspecified: Secondary | ICD-10-CM

## 2017-09-14 DIAGNOSIS — J9621 Acute and chronic respiratory failure with hypoxia: Secondary | ICD-10-CM

## 2017-09-14 DIAGNOSIS — E43 Unspecified severe protein-calorie malnutrition: Secondary | ICD-10-CM

## 2017-09-14 DIAGNOSIS — R0902 Hypoxemia: Secondary | ICD-10-CM

## 2017-09-14 DIAGNOSIS — A0472 Enterocolitis due to Clostridium difficile, not specified as recurrent: Secondary | ICD-10-CM

## 2017-09-14 DIAGNOSIS — Z7189 Other specified counseling: Secondary | ICD-10-CM

## 2017-09-14 DIAGNOSIS — Z515 Encounter for palliative care: Secondary | ICD-10-CM

## 2017-09-14 LAB — MRSA PCR SCREENING: MRSA BY PCR: NEGATIVE

## 2017-09-14 LAB — COMPREHENSIVE METABOLIC PANEL WITH GFR
ALT: 15 U/L (ref 14–54)
AST: 21 U/L (ref 15–41)
Albumin: 2.5 g/dL — ABNORMAL LOW (ref 3.5–5.0)
Alkaline Phosphatase: 54 U/L (ref 38–126)
Anion gap: 11 (ref 5–15)
BUN: 29 mg/dL — ABNORMAL HIGH (ref 6–20)
CO2: 31 mmol/L (ref 22–32)
Calcium: 8.1 mg/dL — ABNORMAL LOW (ref 8.9–10.3)
Chloride: 100 mmol/L — ABNORMAL LOW (ref 101–111)
Creatinine, Ser: 1.17 mg/dL — ABNORMAL HIGH (ref 0.44–1.00)
GFR calc Af Amer: 47 mL/min — ABNORMAL LOW (ref >60)
GFR calc non Af Amer: 41 mL/min — ABNORMAL LOW (ref >60)
Glucose, Bld: 117 mg/dL — ABNORMAL HIGH (ref 65–99)
Potassium: 4.3 mmol/L (ref 3.5–5.1)
Sodium: 142 mmol/L (ref 135–145)
Total Bilirubin: 0.6 mg/dL (ref 0.3–1.2)
Total Protein: 6.4 g/dL — ABNORMAL LOW (ref 6.5–8.1)

## 2017-09-14 LAB — CBC
HCT: 36.1 % (ref 36.0–46.0)
HEMOGLOBIN: 10.4 g/dL — AB (ref 12.0–15.0)
MCH: 28.1 pg (ref 26.0–34.0)
MCHC: 28.8 g/dL — AB (ref 30.0–36.0)
MCV: 97.6 fL (ref 78.0–100.0)
Platelets: 352 10*3/uL (ref 150–400)
RBC: 3.7 MIL/uL — AB (ref 3.87–5.11)
RDW: 14.6 % (ref 11.5–15.5)
WBC: 13.1 10*3/uL — ABNORMAL HIGH (ref 4.0–10.5)

## 2017-09-14 MED ORDER — FUROSEMIDE 10 MG/ML IJ SOLN: 40.0000 mg | Freq: Two times a day (BID) | INTRAMUSCULAR | Status: DC

## 2017-09-14 MED ORDER — DILTIAZEM LOAD VIA INFUSION
5.0000 mg | Freq: Once | INTRAVENOUS | Status: DC
  Filled 2017-09-14: qty 5

## 2017-09-14 MED ORDER — ACETAMINOPHEN 325 MG PO TABS: 650.0000 mg | ORAL_TABLET | Freq: Four times a day (QID) | ORAL | Status: DC | PRN

## 2017-09-14 MED ORDER — VANCOMYCIN HCL 500 MG IV SOLR
500.0000 mg | Freq: Four times a day (QID) | Status: DC
  Administered 2017-09-14 – 2017-09-16 (×7): 500 mg via RECTAL
  Filled 2017-09-14 (×9): qty 500

## 2017-09-14 MED ORDER — ALPRAZOLAM 0.25 MG PO TABS
0.2500 mg | ORAL_TABLET | Freq: Every evening | ORAL | Status: DC | PRN
  Administered 2017-09-15 – 2017-09-21 (×6): 0.25 mg via ORAL
  Filled 2017-09-14 (×6): qty 1

## 2017-09-14 MED ORDER — SODIUM CHLORIDE 0.9 % IV SOLN
INTRAVENOUS | Status: DC
  Administered 2017-09-14: 01:00:00 via INTRAVENOUS

## 2017-09-14 MED ORDER — IPRATROPIUM-ALBUTEROL 0.5-2.5 (3) MG/3ML IN SOLN: 3.0000 mL | Freq: Three times a day (TID) | RESPIRATORY_TRACT | Status: DC

## 2017-09-14 MED ORDER — FAMOTIDINE IN NACL 20-0.9 MG/50ML-% IV SOLN
20.0000 mg | Freq: Once | INTRAVENOUS | Status: AC
  Administered 2017-09-14: 20 mg via INTRAVENOUS
  Filled 2017-09-14: qty 50

## 2017-09-14 MED ORDER — METOPROLOL TARTRATE 5 MG/5ML IV SOLN
2.5000 mg | Freq: Four times a day (QID) | INTRAVENOUS | Status: DC
  Administered 2017-09-14 – 2017-09-17 (×13): 2.5 mg via INTRAVENOUS
  Filled 2017-09-14 (×13): qty 5

## 2017-09-14 MED ORDER — HYDROCERIN EX CREA
TOPICAL_CREAM | Freq: Two times a day (BID) | CUTANEOUS | Status: DC
  Administered 2017-09-14 – 2017-09-16 (×4): via TOPICAL
  Administered 2017-09-17: 1 via TOPICAL
  Administered 2017-09-17 – 2017-09-20 (×6): via TOPICAL
  Filled 2017-09-14: qty 113

## 2017-09-14 MED ORDER — ENOXAPARIN SODIUM 60 MG/0.6ML ~~LOC~~ SOLN
1.0000 mg/kg | SUBCUTANEOUS | Status: DC
  Filled 2017-09-14 (×3): qty 0.6

## 2017-09-14 MED ORDER — LORAZEPAM 2 MG/ML IJ SOLN: 0.5000 mg | Freq: Three times a day (TID) | INTRAMUSCULAR | Status: DC | PRN

## 2017-09-14 MED ORDER — FUROSEMIDE 10 MG/ML IJ SOLN
40.0000 mg | Freq: Two times a day (BID) | INTRAMUSCULAR | Status: DC
  Administered 2017-09-14 – 2017-09-15 (×3): 40 mg via INTRAVENOUS
  Filled 2017-09-14 (×3): qty 4

## 2017-09-14 MED ORDER — ACETAMINOPHEN 650 MG RE SUPP: 650.0000 mg | Freq: Four times a day (QID) | RECTAL | Status: DC | PRN

## 2017-09-14 MED ORDER — PANTOPRAZOLE SODIUM 40 MG IV SOLR
40.0000 mg | INTRAVENOUS | Status: DC
  Administered 2017-09-14 – 2017-09-19 (×6): 40 mg via INTRAVENOUS
  Filled 2017-09-14 (×6): qty 40

## 2017-09-14 MED ORDER — DILTIAZEM HCL-DEXTROSE 100-5 MG/100ML-% IV SOLN (PREMIX)
5.0000 mg/h | INTRAVENOUS | Status: DC
  Administered 2017-09-14 – 2017-09-15 (×3): 5 mg/h via INTRAVENOUS
  Filled 2017-09-14 (×3): qty 100

## 2017-09-14 MED ORDER — METOPROLOL TARTRATE 5 MG/5ML IV SOLN
2.5000 mg | Freq: Three times a day (TID) | INTRAVENOUS | Status: DC
  Administered 2017-09-14: 2.5 mg via INTRAVENOUS
  Filled 2017-09-14: qty 5

## 2017-09-14 MED ORDER — IPRATROPIUM-ALBUTEROL 0.5-2.5 (3) MG/3ML IN SOLN
3.0000 mL | Freq: Two times a day (BID) | RESPIRATORY_TRACT | Status: DC
  Administered 2017-09-14 – 2017-09-15 (×3): 3 mL via RESPIRATORY_TRACT
  Filled 2017-09-14 (×4): qty 3

## 2017-09-14 MED ORDER — METRONIDAZOLE IN NACL 5-0.79 MG/ML-% IV SOLN
500.0000 mg | Freq: Three times a day (TID) | INTRAVENOUS | Status: DC
  Administered 2017-09-14 – 2017-09-18 (×15): 500 mg via INTRAVENOUS
  Filled 2017-09-14 (×15): qty 100

## 2017-09-14 MED ORDER — LEVOTHYROXINE SODIUM 100 MCG IV SOLR
25.0000 ug | Freq: Every day | INTRAVENOUS | Status: DC
  Administered 2017-09-14 – 2017-09-19 (×6): 25 ug via INTRAVENOUS
  Filled 2017-09-14 (×10): qty 5

## 2017-09-14 NOTE — Progress Notes (Signed)
PROGRESS NOTE  Andrea [name] WNU:[id number] DOB: 1929-07-26 DOA: 09/13/2017 PCP: Unk Pinto, MD  Brief History:  83 year old female with a history of bronchiectasis/COPD, chronic respiratory failure on 2 L at night, hypertension, hyperlipidemia, diastolic CHF, hypothyroidism, and PAT/PAFpresented with 4-5-day history of diarrhea and 1 day history of nausea and vomiting.  The patient was recently discharged from the hospital after a stay from August 26, 2017 through September 01, 2017.  During that hospital stay, the patient was treated for acute on chronic diastolic CHF as well as finishing treatment for C. difficile colitis with metronidazole.  The last day of metronidazole was August 30, 2017.  During that hospitalization, the patient also underwent a left-sided thoracocentesis.  Fluid studies revealed that it was transudate of.  The patient was discharged home with furosemide 20 mg daily.  Unfortunately, the patient began having diarrhea 4-5 days prior to this admission.  The patient went to see her primary care provider and C. difficile assay was positive again.  The patient was restarted on metronidazole.  However, the patient states that she was not taking it consistently.  In addition, the patient intermittently does not take her cardiac medications including but not limited to metoprolol, diltiazem, and furosemide.  The patient complains of dyspnea on exertion, but states that this is not much worse than usual.  In addition, the patient states that her lower extremity edema is about the same as usual.  She denies any hematochezia, hematemesis, fevers, chills, chest pain, headache, neck pain.  Upon presentation, CT of the abdomen and pelvis revealed moderate bilateral pleural effusions, left greater than right.  There was also a dilated fluid-filled small bowel loops up to 5.3 cm with a transition point in the mid to distal ileum concerning for bowel obstruction.  Chest x-ray revealed  increased interstitial prominence with bilateral pleural effusions.    Assessment/Plan: Acute on chronic diastolic CHF -August 26, 2017 echo EF 60-65%, grade 2 DD, moderate TR, PASP 61 -d/c IVF -daily weights -accurate I/O's -fluid restrict -start IV Lasix  Small bowel obstruction -Likely secondary to C. difficile colitis the patient also had a small bowel obstruction July 2018 secondary to adhesions. -Appreciate surgical consultation--> nonoperative management for now  Recurrent C. difficile colitis -Start vancomycin enema -Continue metronidazole IV  Atrial fibrillation with RVR/Atrial tachycardia -Increase Lopressor IV to every 6 hours -Start Cardizem drip -Transfer to stepdown unit -Check TSH -lovenox [place] until able to tolerate po   Acute on chronic respiratory failure with hypoxia -Secondary to CHF in the setting of bronchiectasis -Presently on 4 L -Normally on 2 L nasal cannula at home -Wean oxygen back to 2 L for oxygen saturation greater than 92%  COPD/bronchiectasis/chronic respiratory failure with hypoxia -On 2 L nasal cannula at nighttime at home -Presently stable on 3L -start duonebs  Essential hypertension -Holding oral metoprolol and diltiazem initially due to SBO -hydralazine IV prn SBP >180  Anxiety -IV ativan q 8 hrs prn until able to take po xanax  Severe protein calorie malnutrition -Consult nutrition   Disposition Plan:   Home in 3-4 days  Family Communication: No  Family at bedside  Consultants:  General surgery; palliative med  Code Status:  FULL   DVT Prophylaxis:   [place] Lovenox   Procedures: As Listed in Progress Note Above  Antibiotics: None    Subjective: Patient states that vomiting is improved.  She denies any abdominal pain.  She states that she  has only had one loose stool since admission.  She denies any chest pain, fevers, chills, headache, dysuria, hematuria.  She refused to take the Lovenox prior to my explaining  the risks, benefits, and alternatives.  Objective: Vitals:   09/13/17 2330 09/14/17 0037 09/14/17 0552 09/14/17 0559  BP: 109/66 (!) 103/48 126/84 (!) 133/101  Pulse:  76 92 (!) 106  Resp:  _0 Temp:  97.8 F (36.6 C) 98 F (36.7 C) 97.8 F (36.6 C)  TempSrc:  Axillary Axillary Axillary  SpO2:  90% 92% 90%  Weight:      Height:        Intake/Output Summary (Last 24 hours) at 09/14/2017 1042 Last data filed at 09/14/2017 0855 Gross per 24 hour  Intake 1050 ml  Output 200 ml  Net 850 ml   Weight change:  Exam:   General:  Pt is alert, follows commands appropriately, not in acute distress  HEENT: No icterus, No thrush, No neck mass, [place]/AT+JVD  Cardiovascular: RRR, S1/S2, no rubs, no gallops  Respiratory: Bilateral crackles.  No wheezing.  Abdomen: Soft/+BS, non tender, non distended, no guarding  Extremities: 2 + LE edema, No lymphangitis, No petechiae, No rashes, no synovitis   Data Reviewed: I have personally reviewed following labs and imaging studies Basic Metabolic Panel: Recent Labs  Lab 09/08/17 1502 09/13/17 1638 09/14/17 0404  NA 140 141 142  K 4.3 4.1 4.3  CL 95* 96* 100*  CO2 39* 34* 31  GLUCOSE 99 121* 117*  BUN 33* 27* 29*  CREATININE 1.20* 1.10* 1.17*  CALCIUM 9.2 9.2 8.1*   Liver Function Tests: Recent Labs  Lab 09/13/17 1638 09/14/17 0404  AST 22 21  ALT 17 15  ALKPHOS 70 54  BILITOT 0.8 0.6  PROT 7.8 6.4*  ALBUMIN 3.2* 2.5*   No results for input(s): LIPASE, AMYLASE in the last 168 hours. No results for input(s): AMMONIA in the last 168 hours. Coagulation Profile: No results for input(s): INR, PROTIME in the last 168 hours. CBC: Recent Labs  Lab 09/08/17 1502 09/13/17 1638 09/14/17 0404  WBC 11.3* 17.1* 13.1*  NEUTROABS 8,520* 14.4*  --   HGB 10.9* 11.8* 10.4*  HCT 34.2* 40.6 36.1  MCV 89.8 94.9 97.6  PLT 340 385 352   Cardiac Enzymes: Recent Labs  Lab 09/13/17 1638  TROPONINI <0.03   BNP: Invalid  input(s): POCBNP CBG: No results for input(s): GLUCAP in the last 168 hours. HbA1C: No results for input(s): HGBA1C in the last 72 hours. Urine analysis:    Component Value Date/Time   COLORURINE YELLOW 09/13/2017 [place] 09/13/2017 1647   LABSPEC 1.010 09/13/2017 1647   PHURINE 6.0 09/13/2017 1647   GLUCOSEU NEGATIVE 09/13/2017 1647   HGBUR NEGATIVE 09/13/2017 [place] 09/13/2017 1647   KETONESUR NEGATIVE 09/13/2017 1647   PROTEINUR 30 (A) 09/13/2017 1647   UROBILINOGEN 0.2 08/20/2014 1613   NITRITE NEGATIVE 09/13/2017 1647   LEUKOCYTESUR TRACE (A) 09/13/2017 1647   Sepsis Labs: _1 (procalcitonin:4,lacticidven:4) ) Recent Results (from the past 240 hour(s))  Blood culture (routine x 2)     Status: None (Preliminary result)   Collection Time: 09/13/17  4:38 PM  Result Value Ref Range Status   Specimen Description BLOOD RIGHT FOREARM  Final   Special Requests   Final    BOTTLES DRAWN AEROBIC AND ANAEROBIC Blood Culture adequate volume   Culture NO GROWTH < 24 HOURS  Final   Report Status PENDING  Incomplete  Blood culture (routine x 2)     Status: None (Preliminary result)   Collection Time: 09/13/17  4:50 PM  Result Value Ref Range Status   Specimen Description LEFT ANTECUBITAL  Final   Special Requests   Final    BOTTLES DRAWN AEROBIC AND ANAEROBIC Blood Culture adequate volume   Culture NO GROWTH < 24 HOURS  Final   Report Status PENDING  Incomplete     Scheduled Meds: . enoxaparin (LOVENOX) injection  1 mg/kg Subcutaneous Q24H  . furosemide  40 mg Intravenous BID  . hydrocerin   Topical BID  . levothyroxine  25 mcg Intravenous QAC breakfast  . metoprolol tartrate  2.5 mg Intravenous Q6H  . pantoprazole (PROTONIX) IV  40 mg Intravenous Q24H  . vancomycin (VANCOCIN) rectal ENEMA  500 mg Rectal Q6H   Continuous Infusions: . metronidazole Stopped (09/14/17 1001)    Procedures/Studies: Dg Chest 1 View  Result Date:  08/29/2017 INDICATION: Pleural effusions resistant to diuresis. EXAM: ULTRASOUND GUIDED LEFT THORACENTESIS MEDICATIONS: None. COMPLICATIONS: None immediate. PROCEDURE: An ultrasound guided thoracentesis was thoroughly discussed with the patient and questions answered. The benefits, risks, alternatives and complications were also discussed. The patient is at increased bleeding risk due to Eliquis use and we had a lengthy discussion about this; the patient's husband was also called on the telephone. The patient understands and wishes to proceed with the procedure. Written consent was obtained. Ultrasound was performed to localize and mark an adequate pocket of fluid in the left chest. The area was then prepped and draped in the normal sterile fashion. 1% Lidocaine was used for local anesthesia. Under ultrasound guidance a 19 gauge, 7-cm, Yueh catheter was introduced. Thoracentesis was performed until the available fluid was drained. I scanned the left chest and there was no evidence of bleeding. The catheter was removed and a dressing applied. FINDINGS: A total of approximately 1 L of amber fluid was removed. Samples were sent to the laboratory as requested by the clinical team. A chest x-ray was obtained and shows significant decrease in left pleural effusion, with small volume fluid remaining. No visible pneumothorax. There is a moderate right pleural effusion. Cardiomegaly and vascular congestion. IMPRESSION: 1. Successful ultrasound guided left thoracentesis yielding 1 L of pleural fluid. 2. Minimal fluid remaining on the left. Right pleural effusion is moderate. Electronically Signed   By: Monte Fantasia M.D.   On: 08/29/2017 11:35   Dg Chest 2 View  Result Date: 09/01/2017 CLINICAL DATA:  Pleural effusions EXAM: CHEST  2 VIEW COMPARISON:  08/30/2017 CXR FINDINGS: Stable cardiac silhouette partially obscured by bilateral pleural effusions. Aortic atherosclerosis is noted. Decrease in size of small right  pleural effusion with slight increase in left-sided pleural effusion. Adjacent bibasilar atelectasis. Interval decrease in pulmonary vascular redistribution. Osteoarthritis of the [facility] and glenohumeral joint on the right with high-riding right humeral head and remodeling of the undersurface of the acromion. Findings likely reflect chronic rotator cuff tears. IMPRESSION: 1. Slight decrease in right small pleural effusion and minimal increase in small left pleural effusion since prior. 2. Interval decrease in CHF. 3. Aortic atherosclerosis. Electronically Signed   By: Ashley Royalty M.D.   On: 09/01/2017 14:28   Dg Chest 2 View  Result Date: 08/26/2017 CLINICAL DATA:  Patient having increased SOB over the past few days. Patient is on oxygen at home and states that she usually only wears it at night but has been wearing it all day since having SOB. Hx of pna,  htn-controlled with medication, COPD. Ex-smoker. EXAM: CHEST  2 VIEW COMPARISON:  06/28/2017 FINDINGS: The lungs are hyperinflated likely secondary to COPD. There are moderate bilateral pleural effusions with bibasilar atelectasis. There is bilateral diffuse interstitial thickening. There is no pneumothorax. Stable cardiomediastinal silhouette. There is no acute osseous abnormality. There is severe osteoarthritis of the right glenohumeral joint. There is mild osteoarthritis of the left glenohumeral joint. IMPRESSION: 1. Findings consistent with CHF. Electronically Signed   By: Kathreen Devoid   On: 08/26/2017 13:36   Ct Abdomen Pelvis W Contrast  Result Date: 09/13/2017 CLINICAL DATA:  Abdominal distension with vomiting and diarrhea. Shortness of breath. EXAM: CT ABDOMEN AND PELVIS WITH CONTRAST TECHNIQUE: Multidetector CT imaging of the abdomen and pelvis was performed using the standard protocol following bolus administration of intravenous contrast. CONTRAST:  169m ISOVUE-300 IOPAMIDOL (ISOVUE-300) INJECTION 61% COMPARISON:  08/19/2017 FINDINGS: Lower chest:  Emphysema. Persistent bibasilar airspace disease. Mild cardiomegaly with LAD coronary artery atherosclerosis. Moderate bilateral pleural effusions, larger on the left. Similar. Hepatobiliary: Perfusion anomaly involving the anterior left hepatic lobe image 33/ series 2. Normal gallbladder, without biliary ductal dilatation. Pancreas: Normal, without mass or ductal dilatation. Spleen: Normal in size, without focal abnormality. Adrenals/Urinary Tract: Normal right adrenal gland. Minimal left adrenal thickening and nodularity. Bilateral renal cortical thinning. Right renal lesions are favored to represent cysts or minimally complex cysts. Bilateral too small to characterize renal lesions. No hydronephrosis. Normal urinary bladder. Stomach/Bowel: Normal stomach, without wall thickening. Scattered colonic diverticula. The colon is relatively decompressed. Small bowel loops are fluid-filled and dilated, including at up to 5.3 cm. A transition point is identified within the mid to distal ileum, including on images 73 through 77/series 2. No bowel wall thickening to suggest complicating ischemia. No pneumatosis or free intraperitoneal air. Vascular/Lymphatic: Aortic and branch vessel atherosclerosis. No abdominopelvic adenopathy. Reproductive: Hysterectomy. Other: Small volume cul-de-sac fluid including on 77/series 2. Pelvic floor laxity. Musculoskeletal: Osteopenia.  Convex right lumbar spine curvature. IMPRESSION: 1. High-grade partial small bowel obstruction involving the mid to distal ileum. Likely due to adhesions. Although there is adjacent ascites, no specific evidence of complicating ischemia identified. 2. Bilateral pleural effusions and bibasilar atelectasis, as before. 3. Coronary artery atherosclerosis. Aortic Atherosclerosis (ICD10-I70.0). 4. Pelvic floor laxity. Electronically Signed   By: KAbigail MiyamotoM.D.   On: 09/13/2017 20:47   Ct Abdomen Pelvis W Contrast  Result Date: 08/19/2017 CLINICAL DATA:   Diarrhea for 1 week. EXAM: CT ABDOMEN AND PELVIS WITH CONTRAST TECHNIQUE: Multidetector CT imaging of the abdomen and pelvis was performed using the standard protocol following bolus administration of intravenous contrast. CONTRAST:  80 ml ISOVUE-300 IOPAMIDOL (ISOVUE-300) INJECTION 61% COMPARISON:  CT abdomen and pelvis 04/08/2017. FINDINGS: Lower chest: Small to moderate bilateral pleural effusions are present, larger on the left, and increased since the prior CT. Lung bases demonstrate emphysematous disease. Heart size is enlarged. Calcific coronary artery disease is noted. Compressive atelectasis in lung bases is present Hepatobiliary: No focal liver abnormality is seen. 1 cm hypervascular lesion in the anterior left hepatic lobe is better visualized on the prior CT. No gallstones, gallbladder wall thickening, or biliary dilatation. Pancreas: Unremarkable. No pancreatic ductal dilatation or surrounding inflammatory changes. Spleen: Normal in size without focal abnormality. Adrenals/Urinary Tract: Multiple low attenuating lesions in the right kidney are likely cysts and unchanged. No hydronephrosis. The right kidney is ptotic, unchanged. Urinary bladder appears normal. Adrenal glands are unremarkable. Stomach/Bowel: Extensive diverticulosis without diverticulitis is worst in the sigmoid. The colon otherwise appears  normal. Stomach and small bowel appear normal. No evidence of appendicitis. Vascular/Lymphatic: Aortic atherosclerosis. No enlarged abdominal or pelvic lymph nodes. Reproductive: Status post hysterectomy. No adnexal masses. Other: No ascites. Musculoskeletal: Severe convex right scoliosis noted. No lytic or sclerotic lesion. No acute abnormality. IMPRESSION: No acute abnormality abdomen or pelvis. Small to moderate bilateral pleural effusions, larger on the left, increased since the prior CT. Diverticulosis without diverticulitis. Calcific aortic and coronary atherosclerosis. Electronically Signed    By: Inge Rise M.D.   On: 08/19/2017 18:46   Dg Chest Portable 1 View  Result Date: 09/13/2017 CLINICAL DATA:  Shortness of breath.  Vomiting and diarrhea. EXAM: PORTABLE CHEST 1 VIEW COMPARISON:  09/01/2017 FINDINGS: Small to moderate bilateral pleural effusions, increased. Diffuse interstitial and airspace opacity, increased. Probable cardiomegaly, although partially obscured. No pneumothorax. IMPRESSION: CHF pattern including small to moderate pleural effusions. Electronically Signed   By: Monte Fantasia M.D.   On: 09/13/2017 17:53   Dg Chest Port 1 View  Result Date: 08/30/2017 CLINICAL DATA:  COPD.  Pleural effusion. EXAM: PORTABLE CHEST 1 VIEW COMPARISON:  08/29/2017. FINDINGS: Cardiomegaly with pulmonary venous congestion and bilateral interstitial prominence with bilateral pleural effusions again noted. Findings consistent with CHF. No significant change from prior study of 08/29/2017. No evidence of pneumothorax status post recent thoracentesis. IMPRESSION: 1. Persistent cardiomegaly with pulmonary venous congestion, bilateral interstitial prominence and bilateral pleural effusions again noted without significant change. Findings consistent CHF. 2. No evidence of progression of pleural effusions. No pneumothorax status post recent left thoracentesis. Electronically Signed   By: Marcello Moores  Register   On: 08/30/2017 08:43   Dg Chest Port 1 View  Result Date: 08/28/2017 CLINICAL DATA:  Hypoxemia, bilateral pleural effusions EXAM: PORTABLE CHEST 1 VIEW COMPARISON:  08/27/2017 FINDINGS: Moderate to large bilateral pleural effusions. Diffuse bilateral airspace disease again noted. No real change since prior study. Cardiomegaly. IMPRESSION: Severe diffuse bilateral airspace disease and a large effusions. No real change. Electronically Signed   By: Rolm Baptise M.D.   On: 08/28/2017 10:27   Dg Chest Port 1 View  Result Date: 08/27/2017 CLINICAL DATA:  Pleural effusion. EXAM: PORTABLE CHEST 1  VIEW COMPARISON:  08/26/2017 FINDINGS: Moderate bilateral pleural effusions. Diffuse bilateral airspace disease, likely edema/ CHF. Cardiomegaly. Slight interval worsening edema since prior study. IMPRESSION: Worsening aeration of the lungs compatible with worsening edema/ CHF. Moderate bilateral effusions are stable. Electronically Signed   By: Rolm Baptise M.D.   On: 08/27/2017 13:19   Dg Abd 2 Views  Result Date: 09/14/2017 CLINICAL DATA:  Small-bowel obstruction EXAM: ABDOMEN - 2 VIEW COMPARISON:  CT abdomen 09/13/2017 FINDINGS: Dilated small bowel loops in the pelvis similar to improved from the prior study. Contrast in the renal collecting system bilaterally from recent CT. No renal obstruction. Moderate lumbar scoliosis.  No acute skeletal abnormality. IMPRESSION: Small bowel dilatation similar to slightly improved from yesterday. Electronically Signed   By: Franchot Gallo M.D.   On: 09/14/2017 07:48   US Thoracentesis Asp Pleural Space W/img Guide  Result Date: 08/29/2017 INDICATION: Pleural effusions resistant to diuresis. EXAM: ULTRASOUND GUIDED LEFT THORACENTESIS MEDICATIONS: None. COMPLICATIONS: None immediate. PROCEDURE: An ultrasound guided thoracentesis was thoroughly discussed with the patient and questions answered. The benefits, risks, alternatives and complications were also discussed. The patient is at increased bleeding risk due to Eliquis use and we had a lengthy discussion about this; the patient's husband was also called on the telephone. The patient understands and wishes to proceed with the procedure. Written  consent was obtained. Ultrasound was performed to localize and mark an adequate pocket of fluid in the left chest. The area was then prepped and draped in the normal sterile fashion. 1% Lidocaine was used for local anesthesia. Under ultrasound guidance a 19 gauge, 7-cm, Yueh catheter was introduced. Thoracentesis was performed until the available fluid was drained. I scanned  the left chest and there was no evidence of bleeding. The catheter was removed and a dressing applied. FINDINGS: A total of approximately 1 L of amber fluid was removed. Samples were sent to the laboratory as requested by the clinical team. A chest x-ray was obtained and shows significant decrease in left pleural effusion, with small volume fluid remaining. No visible pneumothorax. There is a moderate right pleural effusion. Cardiomegaly and vascular congestion. IMPRESSION: 1. Successful ultrasound guided left thoracentesis yielding 1 L of pleural fluid. 2. Minimal fluid remaining on the left. Right pleural effusion is moderate. Electronically Signed   By: Monte Fantasia M.D.   On: 08/29/2017 11:35    Orson Eva, DO  [facility] Pager [phone]  If 7PM-7AM, please contact night-coverage [email or web address] Password [email or web address] 09/14/2017, 10:42 AM   LOS: 1 day

## 2017-09-14 NOTE — Progress Notes (Signed)
[place] for Lovenox Indication: atrial fibrillation  Allergies  Allergen Reactions  . Vancomycin     Worsening kidney function  . Sulfa Antibiotics Palpitations    Unknown    Patient Measurements: Height: _0  (160 cm) Weight: 105 lb (47.6 kg) IBW/kg (Calculated) : 52.4 kg HEPARIN DW (KG): 47.6 kg   Vital Signs: Temp: 97.8 F (36.6 C) (12/19 0037) Temp Source: Axillary (12/19 0037) BP: 103/48 (12/19 0037) Pulse Rate: 76 (12/19 0037)  Labs: Recent Labs    09/13/17 1638  HGB 11.8*  HCT 40.6  PLT 385  CREATININE 1.10*  TROPONINI <0.03   Estimated Creatinine Clearance: 27.6 mL/min (A) (by C-G formula based on SCr of 1.1 mg/dL (H)).  Medical History: Past Medical History:  Diagnosis Date  . Aortic insufficiency 09/04/2015  . Aortic regurgitation   . Bronchiectasis ([place])   . COPD (chronic obstructive pulmonary disease) ([place])   . DJD (degenerative joint disease)   . HLD (hyperlipidemia)   . HTN (hypertension)   . Mild mitral regurgitation by prior echocardiogram   . Mild mitral regurgitation by prior echocardiogram   . Mild tricuspid regurgitation   . Palpitations    a. has known history of PAC's and PVC's. b. 09/2015: monitor showed sinus rhythm with occasional PAC's and a brief episode of PAT.  [name] Thyroid disease     Medications:  Medications Prior to Admission  Medication Sig Dispense Refill Last Dose  . ALPRAZolam (XANAX) 0.25 MG tablet Take 0.125-0.25 mg by mouth at bedtime.    09/12/2017 at Unknown time  . apixaban (ELIQUIS) 2.5 MG TABS tablet Take 1 tablet (2.5 mg total) by mouth 2 (two) times daily. 180 tablet 3 09/13/2017 at 1100a  . diltiazem (CARDIZEM) 60 MG tablet Take 60 mg by mouth 4 (four) times daily.    09/13/2017 at Unknown time  . furosemide (LASIX) 20 MG tablet Take 1 tablet (20 mg total) by mouth daily. 30 tablet 0 09/13/2017 at Unknown time  . levothyroxine (SYNTHROID, LEVOTHROID) 50 MCG tablet TAKE  ONE TABLET BY MOUTH ONCE A DAY AS DIRECTED 90 tablet 1 09/13/2017 at Unknown time  . Metoprolol Tartrate 37.5 MG TABS Take 37.5 mg by mouth 2 (two) times daily. 180 tablet 3 09/13/2017 at 1100a  . Probiotic Product (PROBIOTIC-10) CHEW Chew 2 tablets by mouth daily.   Past Week at Unknown time  . metroNIDAZOLE (FLAGYL) 500 MG tablet Take 1 tablet 3 x / day after meals for infection (Patient not taking: Reported on 09/13/2017) 42 tablet 0 Not Taking at Unknown time  . silver sulfADIAZINE (SILVADENE) 1 % cream Apply to affected area daily (Patient not taking: Reported on 09/13/2017) 50 g 1 Completed Course at Unknown time  . traMADol (ULTRAM) 50 MG tablet Take 1 tablet (50 mg total) by mouth every 6 (six) hours as needed. 20 tablet 0 UNKNOWN   Scheduled:  . diatrizoate meglumine-sodium      . enoxaparin (LOVENOX) injection  1 mg/kg Subcutaneous Q24H  . levothyroxine  25 mcg Intravenous QAC breakfast  . metoprolol tartrate  2.5 mg Intravenous Q8H   Infusions:  . sodium chloride 50 mL/hr at 09/14/17 0058  . metronidazole 500 mg (09/14/17 0058)     Assessment: 83 yo female with hx of paroxysmal atrial fibrillation on apixaban 2.5 mg bid, last dose on 09/13/17 @ 11:00 am.  Pt with renal insufficiency with CrCl < 30 ml/min.  Pt has been made NPO with current diagnosis of  partial small bowel obstruction involving the ileum.    Plan:  Lovenox 1 mg/kg (50 mg) SQ Q 24 hr.  Will start now since patient's last apixaban dose was > 12 hours ago. CBC monitoring every 72 hours   Beryle Lathe, [facility] 09/14/2017,1:07 AM

## 2017-09-14 NOTE — Progress Notes (Signed)
Pt refused the lovenox injection. Pt states she hemorrhaged the last time she was given the med.

## 2017-09-14 NOTE — Consult Note (Signed)
Consultation Note Date: 09/14/2017   Patient Name: Andrea [name]  DOB: April 26, 1929  MRN: [id number]  Age / Sex: 83 y.o., female  PCP: Andrea Pinto, MD Referring Physician: Orson Eva, MD  Reason for Consultation: Establishing goals of care and Psychosocial/spiritual support  HPI/Patient Profile: 83 y.o. female  with past medical history of aortic insufficiency and regurgitation, COPD with history 20 pack years of smoking, mild mitral regurgitation, high blood pressure and cholesterol, DJD, history of palpitations, thyroid disease admitted on 09/13/2017 with partial small bowel obstruction and recurrent C. difficile.   Clinical Assessment and Goals of Care: Andrea [name] is lying quietly in bed.  She appears very frail and ill.  She is able to tell me her name, and where we are, also that she has 2 daughters Andrea [name] and Andrea [name] at bedside.  She tells me that she does not want to have to go through all this.  I asked her to tell me more, and she shares that she does not want to have to do these treatments.  I share that that is why I am here, to help her find a balance with treatment.  We talked about CPR and intubation, and Andrea [name] tells me that she would want CPR and intubation.  I share the concept of treat the treatable but no CPR and intubation I share my worry about her frailty.  I speak with her daughters at bedside, Andrea [name] is the most vocal about her mother's care stating that they and their mother wants "aggressive treatment".  We talked about the treatment plan, I share that at this point, she is receiving "aggressive treatment".  We talked about CPR and intubation.  Andrea [name] shares that her mother has "seeing people on TV in [place] who recover"and that she wants CPR, any chance at recovery, but not to be kept alive on machines..  I share that we will absolutely about their wishes.  I ask Andrea [name] how long her mother  would like to be on the ventilator.  Andrea [name] laughs and [name] her mother "2 years".  She then looks at me and states a month.  I share that after a 2 weeks time she would need the ventilator moved to her throat for tracheostomy.  Andrea [name] about administration of vancomycin per rectum.  I shared that this is the standard treatment.  I also acknowledge that there is some dignity issues related to her rectum medication installation.  I asked Andrea [name] if she does not want medications per rectum, I encourage them to think about CPR/intubation.  We reviewed labs including white blood cells reduced from 17-13.  Nursing staff is at bedside providing information related to treatment plan and labs and also psychosocial support.  We talked about 24-48 hours for information gathering, and that I will follow-up with him tomorrow.  Conference with hospitalist, Andrea [name] related to meeting.  Healthcare power of attorney NEXT OF KIN -no official paperwork, husband, Andrea [name] is able to make healthcare choices.  Daughter Andrea [name] at bedside.  5 living children.   SUMMARY OF RECOMMENDATIONS   At this point, full scope treatment. Continue CODE STATUS discussions.  Code Status/Advance Care Planning:  Full code -the concept of treat the treatable but no CPR, no intubation, shared with patient and daughters Andrea [name] and Andrea [name].  Symptom Management:   Per hospitalist, no additional needs at this time.  Palliative Prophylaxis:   Turn Reposition  Additional Recommendations (Limitations, Scope, Preferences):  Full Scope Treatment  Psycho-social/Spiritual:   Desire for further Chaplaincy support:no  Additional Recommendations: Caregiving  Support/Resources  Prognosis:   < 6 months, would not be surprising based on 4 hospitalizations in 6 mo, frailty, functional status, recurrent c. Diff.   Discharge Planning: To Be Determined      Primary Diagnoses: Present on Admission: . SBO (small bowel  obstruction) ([place]) . Protein-calorie malnutrition, severe ([place]) . AF (paroxysmal atrial fibrillation) ([place]) . Acute on chronic diastolic CHF (congestive heart failure) ([place])   I have reviewed the medical record, interviewed the patient and family, and examined the patient. The following aspects are pertinent.  Past Medical History:  Diagnosis Date  . Aortic insufficiency 09/04/2015  . Aortic regurgitation   . Bronchiectasis ([place])   . COPD (chronic obstructive pulmonary disease) ([place])   . DJD (degenerative joint disease)   . HLD (hyperlipidemia)   . HTN (hypertension)   . Mild mitral regurgitation by prior echocardiogram   . Mild mitral regurgitation by prior echocardiogram   . Mild tricuspid regurgitation   . Palpitations    a. has known history of PAC's and PVC's. b. 09/2015: monitor showed sinus rhythm with occasional PAC's and a brief episode of PAT.  [name] Thyroid disease    Social History   Socioeconomic History  . Marital status: Married    Spouse name: None  . Number of children: 5  . Years of education: None  . Highest education level: None  Social Needs  . Financial resource strain: None  . Food insecurity - worry: None  . Food insecurity - inability: None  . Transportation needs - medical: None  . Transportation needs - non-medical: None  Occupational History  . Occupation: retired  Tobacco Use  . Smoking status: Former Smoker    Packs/day: 1.00    Years: 20.00    Pack years: 20.00    Types: Cigarettes    Last attempt to quit: 09/27/1974    Years since quitting: 42.9  . Smokeless tobacco: Never Used  Substance and Sexual Activity  . Alcohol use: No  . Drug use: No  . Sexual activity: No  Other Topics Concern  . None  Social History Narrative  . None   Family History  Problem Relation Age of Onset  . Emphysema Mother   . Diabetes Mother   . Cancer Father   . Emphysema Sister   . Emphysema Sister   . Rheumatic fever Sister   . Heart attack Sister     . Diabetes Sister   . Diabetes Brother   . Asthma Brother        as a child   Scheduled Meds: . diltiazem  5 mg Intravenous Once  . enoxaparin (LOVENOX) injection  1 mg/kg Subcutaneous Q24H  . furosemide  40 mg Intravenous BID  . hydrocerin   Topical BID  . ipratropium-albuterol  3 mL Nebulization BID  . levothyroxine  25 mcg Intravenous QAC breakfast  . metoprolol tartrate  2.5 mg Intravenous Q6H  . pantoprazole (PROTONIX) IV  40 mg Intravenous Q24H  .  vancomycin (VANCOCIN) rectal ENEMA  500 mg Rectal Q6H   Continuous Infusions: . diltiazem (CARDIZEM) infusion 5 mg/hr (09/14/17 1525)  . metronidazole Stopped (09/14/17 1001)   PRN Meds:.acetaminophen **OR** acetaminophen, LORazepam, ondansetron **OR** ondansetron (ZOFRAN) IV Medications Prior to Admission:  Prior to Admission medications   Medication Sig Start Date End Date Taking? Authorizing Provider  ALPRAZolam (XANAX) 0.25 MG tablet Take 0.125-0.25 mg by mouth at bedtime.    Yes [provider]  apixaban (ELIQUIS) 2.5 MG TABS tablet Take 1 tablet (2.5 mg total) by mouth 2 (two) times daily. 05/26/17  Yes Lelon Perla, MD  diltiazem (CARDIZEM) 60 MG tablet Take 60 mg by mouth 4 (four) times daily.  05/31/17  Yes [provider]  furosemide (LASIX) 20 MG tablet Take 1 tablet (20 mg total) by mouth daily. 09/01/17  Yes Dhungel, Nishant, MD  levothyroxine (SYNTHROID, LEVOTHROID) 50 MCG tablet TAKE ONE TABLET BY MOUTH ONCE A DAY AS DIRECTED 08/09/17  Yes Andrea Pinto, MD  Metoprolol Tartrate 37.5 MG TABS Take 37.5 mg by mouth 2 (two) times daily. 05/31/17  Yes Lelon Perla, MD  Probiotic Product (PROBIOTIC-10) CHEW Chew 2 tablets by mouth daily.   Yes [provider]  metroNIDAZOLE (FLAGYL) 500 MG tablet Take 1 tablet 3 x / day after meals for infection Patient not taking: Reported on 09/13/2017 09/12/17 09/26/17  Andrea Pinto, MD  silver sulfADIAZINE (SILVADENE) 1 % cream Apply to affected  area daily Patient not taking: Reported on 09/13/2017 07/14/17 07/14/18  [name], MD  traMADol (ULTRAM) 50 MG tablet Take 1 tablet (50 mg total) by mouth every 6 (six) hours as needed. 07/07/17   [name], MD   Allergies  Allergen Reactions  . Vancomycin     Worsening kidney function  . Sulfa Antibiotics Palpitations    Unknown   Review of Systems  Unable to perform ROS: Acuity of condition    Physical Exam  Constitutional: She is oriented to person, place, and time. She appears ill.  HENT:  Head: Atraumatic.  Some temporal wasting, periorbital wasting  Pulmonary/Chest: Effort normal. No accessory muscle usage. No respiratory distress.  Abdominal: Soft. There is tenderness.  Neurological: She is alert and oriented to person, place, and time.  Skin: Skin is dry.  Right lower leg wound partial and full thickness, rt venous insufficiency    Psychiatric: Her mood appears not anxious. She is not agitated.  Nursing note and vitals reviewed.   Vital Signs: BP (!) 133/101 (BP Location: Left Arm)   Pulse (!) 106   Temp 97.8 F (36.6 C) (Axillary)   Resp 18   Ht _0  (1.6 m)   Wt 47.6 kg (105 lb)   SpO2 92%   BMI 18.60 kg/m  Pain Assessment: No/denies pain   Pain Score: 0-No pain   SpO2: SpO2: 92 % O2 Device:SpO2: 92 % O2 Flow Rate: .O2 Flow Rate (L/min): 4 L/min  IO: Intake/output summary:   Intake/Output Summary (Last 24 hours) at 09/14/2017 1547 Last data filed at 09/14/2017 1500 Gross per 24 hour  Intake 1250 ml  Output 200 ml  Net 1050 ml    LBM: Last BM Date: 09/13/17 Baseline Weight: Weight: 47.6 kg (105 lb) Most recent weight: Weight: 47.6 kg (105 lb)     Palliative Assessment/Data:   Flowsheet Rows     Most Recent Value  Intake Tab  Referral Department  Hospitalist  Unit at Time of Referral  ICU  Palliative Care  Primary Diagnosis  Other (Comment) [Recurrent C. difficile, A. fib with RVR]  Date Notified  09/14/17  Palliative  Care Type  New Palliative care  Reason for referral  Clarify Goals of Care  Date of Admission  09/13/17  Date first seen by Palliative Care  09/14/17  # of days Palliative referral response time  0 Day(s)  # of days IP prior to Palliative referral  1  Clinical Assessment  Palliative Performance Scale Score  30%  Pain Max last 24 hours  Not able to report  Pain Min Last 24 hours  Not able to report  Dyspnea Max Last 24 Hours  Not able to report  Dyspnea Min Last 24 hours  Not able to report  Psychosocial & Spiritual Assessment  Palliative Care Outcomes  Patient/Family meeting held?  Yes  Who was at the meeting?  Patient and daughter's Andrea [name] and Andrea [name] at bedside      Time In: 1500 Time Out: 1610 Time Total: 70 minutes Greater than 50%  of this time was spent counseling and coordinating care related to the above assessment and plan.  Signed by: Drue Novel, NP   Please contact Palliative Medicine Team phone at [phone] for questions and concerns.  For individual provider: See Shea Evans

## 2017-09-14 NOTE — Consult Note (Signed)
Reason for Consult: Small bowel obstruction, C. difficile colitis Referring Physician: Dr. Zachery Dauer is an 83 y.o. female.  HPI: Patient is an 83 year old white female with multiple medical problems who presented back to the emergency room with nausea and diarrhea.  She has a history of C. difficile colitis in recent cultures have shown that is persisting.  She was admitted to the hospital for further evaluation and treatment.  She has multiple cardiopulmonary issues as well as peripheral vascular disease issues.  This morning, she states her abdominal pain has resolved.  She continues to have some diarrhea.  She has no nausea or vomiting and desires liquids.  Past Medical History:  Diagnosis Date  . Aortic insufficiency 09/04/2015  . Aortic regurgitation   . Bronchiectasis ([place])   . COPD (chronic obstructive pulmonary disease) ([place])   . DJD (degenerative joint disease)   . HLD (hyperlipidemia)   . HTN (hypertension)   . Mild mitral regurgitation by prior echocardiogram   . Mild mitral regurgitation by prior echocardiogram   . Mild tricuspid regurgitation   . Palpitations    a. has known history of PAC's and PVC's. b. 09/2015: monitor showed sinus rhythm with occasional PAC's and a brief episode of PAT.  [name] Thyroid disease     Past Surgical History:  Procedure Laterality Date  . ABDOMINAL HYSTERECTOMY  1993  . CATARACT EXTRACTION, BILATERAL    . WOUND DEBRIDEMENT Right 06/29/2017   Procedure: RIGHT LOWER  LEG DEBRIDEMENT;  Surgeon: [name], MD;  Location: [facility];  Service: General;  Laterality: Right;    Family History  Problem Relation Age of Onset  . Emphysema Mother   . Diabetes Mother   . Cancer Father   . Emphysema Sister   . Emphysema Sister   . Rheumatic fever Sister   . Heart attack Sister   . Diabetes Sister   . Diabetes Brother   . Asthma Brother        as a child    Social History:  reports that she quit smoking about 42 years ago. Her  smoking use included cigarettes. She has a 20.00 pack-year smoking history. she has never used smokeless tobacco. She reports that she does not drink alcohol or use drugs.  Allergies:  Allergies  Allergen Reactions  . Vancomycin     Worsening kidney function  . Sulfa Antibiotics Palpitations    Unknown    Medications: I have reviewed the patient's current medications.  Results for orders placed or performed during the hospital encounter of 09/13/17 (from the past 48 hour(s))  CBC with Differential     Status: Abnormal   Collection Time: 09/13/17  4:38 PM  Result Value Ref Range   WBC 17.1 (H) 4.0 - 10.5 K/uL   RBC 4.28 3.87 - 5.11 MIL/uL   Hemoglobin 11.8 (L) 12.0 - 15.0 g/dL   HCT 40.6 36.0 - 46.0 %   MCV 94.9 78.0 - 100.0 fL   MCH 27.6 26.0 - 34.0 pg   MCHC 29.1 (L) 30.0 - 36.0 g/dL   RDW 14.4 11.5 - 15.5 %   Platelets 385 150 - 400 K/uL   Neutrophils Relative % 84 %   Neutro Abs 14.4 (H) 1.7 - 7.7 K/uL   Lymphocytes Relative 9 %   Lymphs Abs 1.5 0.7 - 4.0 K/uL   Monocytes Relative 7 %   Monocytes Absolute 1.3 (H) 0.1 - 1.0 K/uL   Eosinophils Relative 0 %   Eosinophils  Absolute 0.0 0.0 - 0.7 K/uL   Basophils Relative 0 %   Basophils Absolute 0.0 0.0 - 0.1 K/uL  Comprehensive metabolic panel     Status: Abnormal   Collection Time: 09/13/17  4:38 PM  Result Value Ref Range   Sodium 141 135 - 145 mmol/L   Potassium 4.1 3.5 - 5.1 mmol/L   Chloride 96 (L) 101 - 111 mmol/L   CO2 34 (H) 22 - 32 mmol/L   Glucose, Bld 121 (H) 65 - 99 mg/dL   BUN 27 (H) 6 - 20 mg/dL   Creatinine, Ser 1.10 (H) 0.44 - 1.00 mg/dL   Calcium 9.2 8.9 - 10.3 mg/dL   Total Protein 7.8 6.5 - 8.1 g/dL   Albumin 3.2 (L) 3.5 - 5.0 g/dL   AST 22 15 - 41 U/L   ALT 17 14 - 54 U/L   Alkaline Phosphatase 70 38 - 126 U/L   Total Bilirubin 0.8 0.3 - 1.2 mg/dL   GFR calc non Af Amer 44 (L) >60 mL/min   GFR calc Af Amer 51 (L) >60 mL/min    Comment: (NOTE) The eGFR has been calculated using the CKD EPI  equation. This calculation has not been validated in all clinical situations. eGFR's persistently <60 mL/min signify possible Chronic Kidney Disease.    Anion gap 11 5 - 15  Lactic acid, plasma     Status: None   Collection Time: 09/13/17  4:38 PM  Result Value Ref Range   Lactic Acid, Venous 1.3 0.5 - 1.9 mmol/L  Blood culture (routine x 2)     Status: None (Preliminary result)   Collection Time: 09/13/17  4:38 PM  Result Value Ref Range   Specimen Description BLOOD RIGHT FOREARM    Special Requests      BOTTLES DRAWN AEROBIC AND ANAEROBIC Blood Culture adequate volume   Culture PENDING    Report Status PENDING   Troponin I     Status: None   Collection Time: 09/13/17  4:38 PM  Result Value Ref Range   Troponin I <0.03 <0.03 ng/mL  Urinalysis, Routine w reflex microscopic     Status: Abnormal   Collection Time: 09/13/17  4:47 PM  Result Value Ref Range   Color, Urine YELLOW YELLOW   APPearance CLEAR CLEAR   Specific Gravity, Urine 1.010 1.005 - 1.030   pH 6.0 5.0 - 8.0   Glucose, UA NEGATIVE NEGATIVE mg/dL   Hgb urine dipstick NEGATIVE NEGATIVE   Bilirubin Urine NEGATIVE NEGATIVE   Ketones, ur NEGATIVE NEGATIVE mg/dL   Protein, ur 30 (A) NEGATIVE mg/dL   Nitrite NEGATIVE NEGATIVE   Leukocytes, UA TRACE (A) NEGATIVE   RBC / HPF 0-5 0 - 5 RBC/hpf   WBC, UA 6-30 0 - 5 WBC/hpf   Bacteria, UA NONE SEEN NONE SEEN   Squamous Epithelial / LPF 0-5 (A) NONE SEEN   Mucus PRESENT    Hyaline Casts, UA PRESENT   Blood culture (routine x 2)     Status: None (Preliminary result)   Collection Time: 09/13/17  4:50 PM  Result Value Ref Range   Specimen Description LEFT ANTECUBITAL    Special Requests      BOTTLES DRAWN AEROBIC AND ANAEROBIC Blood Culture adequate volume   Culture PENDING    Report Status PENDING   Lactic acid, plasma     Status: None   Collection Time: 09/13/17  6:45 PM  Result Value Ref Range   Lactic Acid, Venous 1.2 0.5 -  1.9 mmol/L  CBC     Status: Abnormal    Collection Time: 09/14/17  4:04 AM  Result Value Ref Range   WBC 13.1 (H) 4.0 - 10.5 K/uL   RBC 3.70 (L) 3.87 - 5.11 MIL/uL   Hemoglobin 10.4 (L) 12.0 - 15.0 g/dL   HCT 36.1 36.0 - 46.0 %   MCV 97.6 78.0 - 100.0 fL   MCH 28.1 26.0 - 34.0 pg   MCHC 28.8 (L) 30.0 - 36.0 g/dL   RDW 14.6 11.5 - 15.5 %   Platelets 352 150 - 400 K/uL  Comprehensive metabolic panel     Status: Abnormal   Collection Time: 09/14/17  4:04 AM  Result Value Ref Range   Sodium 142 135 - 145 mmol/L   Potassium 4.3 3.5 - 5.1 mmol/L   Chloride 100 (L) 101 - 111 mmol/L   CO2 31 22 - 32 mmol/L   Glucose, Bld 117 (H) 65 - 99 mg/dL   BUN 29 (H) 6 - 20 mg/dL   Creatinine, Ser 1.17 (H) 0.44 - 1.00 mg/dL   Calcium 8.1 (L) 8.9 - 10.3 mg/dL   Total Protein 6.4 (L) 6.5 - 8.1 g/dL   Albumin 2.5 (L) 3.5 - 5.0 g/dL   AST 21 15 - 41 U/L   ALT 15 14 - 54 U/L   Alkaline Phosphatase 54 38 - 126 U/L   Total Bilirubin 0.6 0.3 - 1.2 mg/dL   GFR calc non Af Amer 41 (L) >60 mL/min   GFR calc Af Amer 47 (L) >60 mL/min    Comment: (NOTE) The eGFR has been calculated using the CKD EPI equation. This calculation has not been validated in all clinical situations. eGFR's persistently <60 mL/min signify possible Chronic Kidney Disease.    Anion gap 11 5 - 15    Ct Abdomen Pelvis W Contrast  Result Date: 09/13/2017 CLINICAL DATA:  Abdominal distension with vomiting and diarrhea. Shortness of breath. EXAM: CT ABDOMEN AND PELVIS WITH CONTRAST TECHNIQUE: Multidetector CT imaging of the abdomen and pelvis was performed using the standard protocol following bolus administration of intravenous contrast. CONTRAST:  113m ISOVUE-300 IOPAMIDOL (ISOVUE-300) INJECTION 61% COMPARISON:  08/19/2017 FINDINGS: Lower chest: Emphysema. Persistent bibasilar airspace disease. Mild cardiomegaly with LAD coronary artery atherosclerosis. Moderate bilateral pleural effusions, larger on the left. Similar. Hepatobiliary: Perfusion anomaly involving the anterior  left hepatic lobe image 33/ series 2. Normal gallbladder, without biliary ductal dilatation. Pancreas: Normal, without mass or ductal dilatation. Spleen: Normal in size, without focal abnormality. Adrenals/Urinary Tract: Normal right adrenal gland. Minimal left adrenal thickening and nodularity. Bilateral renal cortical thinning. Right renal lesions are favored to represent cysts or minimally complex cysts. Bilateral too small to characterize renal lesions. No hydronephrosis. Normal urinary bladder. Stomach/Bowel: Normal stomach, without wall thickening. Scattered colonic diverticula. The colon is relatively decompressed. Small bowel loops are fluid-filled and dilated, including at up to 5.3 cm. A transition point is identified within the mid to distal ileum, including on images 73 through 77/series 2. No bowel wall thickening to suggest complicating ischemia. No pneumatosis or free intraperitoneal air. Vascular/Lymphatic: Aortic and branch vessel atherosclerosis. No abdominopelvic adenopathy. Reproductive: Hysterectomy. Other: Small volume cul-de-sac fluid including on 77/series 2. Pelvic floor laxity. Musculoskeletal: Osteopenia.  Convex right lumbar spine curvature. IMPRESSION: 1. High-grade partial small bowel obstruction involving the mid to distal ileum. Likely due to adhesions. Although there is adjacent ascites, no specific evidence of complicating ischemia identified. 2. Bilateral pleural effusions and bibasilar atelectasis, as before.  3. Coronary artery atherosclerosis. Aortic Atherosclerosis (ICD10-I70.0). 4. Pelvic floor laxity. Electronically Signed   By: Abigail Miyamoto M.D.   On: 09/13/2017 20:47   Dg Chest Portable 1 View  Result Date: 09/13/2017 CLINICAL DATA:  Shortness of breath.  Vomiting and diarrhea. EXAM: PORTABLE CHEST 1 VIEW COMPARISON:  09/01/2017 FINDINGS: Small to moderate bilateral pleural effusions, increased. Diffuse interstitial and airspace opacity, increased. Probable  cardiomegaly, although partially obscured. No pneumothorax. IMPRESSION: CHF pattern including small to moderate pleural effusions. Electronically Signed   By: Monte Fantasia M.D.   On: 09/13/2017 17:53   Dg Abd 2 Views  Result Date: 09/14/2017 CLINICAL DATA:  Small-bowel obstruction EXAM: ABDOMEN - 2 VIEW COMPARISON:  CT abdomen 09/13/2017 FINDINGS: Dilated small bowel loops in the pelvis similar to improved from the prior study. Contrast in the renal collecting system bilaterally from recent CT. No renal obstruction. Moderate lumbar scoliosis.  No acute skeletal abnormality. IMPRESSION: Small bowel dilatation similar to slightly improved from yesterday. Electronically Signed   By: Franchot Gallo M.D.   On: 09/14/2017 07:48    ROS:  Pertinent items are noted in HPI.  Blood pressure (!) 133/101, pulse (!) 106, temperature 97.8 F (36.6 C), temperature source Axillary, resp. rate 18, height _0  (1.6 m), weight 105 lb (47.6 kg), SpO2 90 %. Physical Exam: Mildly cachectic white female in no acute distress Head is normocephalic, atraumatic Lungs are relatively clear to auscultation with decreased breath sounds bilaterally. Heart examination reveals an irregular regular rate and rhythm Abdomen is soft and slightly distended, but not tense.  No rigidity is noted.  No hernias are noted.  No hepatosplenomegaly is noted.  Bowel sounds are present.  CT scan images personally reviewed Assessment/Plan: Impression: Partial small bowel obstruction, ongoing C. difficile colitis, uncontrolled atrial fibrillation, chronic anticoagulation, history of congestive heart failure with pleural effusions Bowel obstruction findings on CT scan may be related to mild ileus due to the C. difficile colitis.  Adhesive disease may be playing a component.  Clinically, she has not fully obstructed.  No acute surgical intervention is warranted at this time.  Patient is at high risk for surgical intervention. Plan: We will  start clear liquid diet.  We will follow with you. Aviva Signs 09/14/2017, 10:10 AM

## 2017-09-14 NOTE — Progress Notes (Signed)
Pt is on 6L [place] with O2 sat of 88%. Pt placed on HFNC after nebulizer treatment and HFNC had to be increased to 14L to maintain an O2 saturation of 95%.

## 2017-09-14 NOTE — Consult Note (Addendum)
[facility] Nurse wound consult note Reason for Consult:Right lower leg wound partial and full thickness Wound type: venous insufficiency Pressure Injury POA: NA Measurement: entire area is a partial thickness wound 6cm x 4cm with a 1.5cm x 2cm scabbed area that builds above skin line. Wound bed: partial thickness area pink. Drainage (amount, consistency, odor) none Periwound: dry, scaly Dressing procedure/placement/frequency: I have provided nurses with orders for wound care dressings with Petroleum gauze for protection and for Eucerin lotion to bilateral lower legs for dry scaly skin. Patient's nutritional status is poor, supplements or nutritional consult recommended, please order if you agree. We will not follow, but will remain available to this patient, to nursing, and the medical and/or surgical teams.  Please re-consult if we need to assist further.   Fara Olden, RN-C, WTA-C Wound Treatment Associate

## 2017-09-14 NOTE — Plan of Care (Signed)
Pt alert and oriented x4. Able to make needs known. No complaint of pain or distress. Foam to sacral for preventative care. Dry gauze to right lower leg. Skin tear healing, redness around area and edma, non pitting. Oxygen 4l. Telemetry box 14, running 117-120s Afib.Call light within reach. Bed in lowest position. Will continue to monitor.

## 2017-09-15 ENCOUNTER — Inpatient Hospital Stay (HOSPITAL_COMMUNITY): Payer: Medicare Other

## 2017-09-15 ENCOUNTER — Telehealth: Payer: Self-pay | Admitting: Cardiology

## 2017-09-15 ENCOUNTER — Encounter (HOSPITAL_COMMUNITY): Payer: Self-pay | Admitting: Cardiology

## 2017-09-15 ENCOUNTER — Ambulatory Visit: Payer: Medicare Other | Admitting: General Surgery

## 2017-09-15 LAB — GLUCOSE, PLEURAL OR PERITONEAL FLUID: Glucose, Fluid: 157 mg/dL

## 2017-09-15 LAB — CBC
HCT: 37.4 % (ref 36.0–46.0)
Hemoglobin: 10.6 g/dL — ABNORMAL LOW (ref 12.0–15.0)
MCH: 28.2 pg (ref 26.0–34.0)
MCHC: 28.3 g/dL — ABNORMAL LOW (ref 30.0–36.0)
MCV: 99.5 fL (ref 78.0–100.0)
PLATELETS: 359 10*3/uL (ref 150–400)
RBC: 3.76 MIL/uL — AB (ref 3.87–5.11)
RDW: 14.6 % (ref 11.5–15.5)
WBC: 13.9 10*3/uL — AB (ref 4.0–10.5)

## 2017-09-15 LAB — BODY FLUID CELL COUNT WITH DIFFERENTIAL
EOS FL: 0 %
Lymphs, Fluid: 74 %
MONOCYTE-MACROPHAGE-SEROUS FLUID: 12 % — AB (ref 50–90)
Neutrophil Count, Fluid: 14 % (ref 0–25)
WBC FLUID: 568 uL (ref 0–1000)

## 2017-09-15 LAB — BASIC METABOLIC PANEL
Anion gap: 11 (ref 5–15)
BUN: 41 mg/dL — AB (ref 6–20)
CALCIUM: 8.1 mg/dL — AB (ref 8.9–10.3)
CHLORIDE: 99 mmol/L — AB (ref 101–111)
CO2: 30 mmol/L (ref 22–32)
CREATININE: 1.8 mg/dL — AB (ref 0.44–1.00)
GFR calc non Af Amer: 24 mL/min — ABNORMAL LOW (ref >60)
GFR, EST AFRICAN AMERICAN: 28 mL/min — AB (ref >60)
Glucose, Bld: 111 mg/dL — ABNORMAL HIGH (ref 65–99)
Potassium: 4.1 mmol/L (ref 3.5–5.1)
SODIUM: 140 mmol/L (ref 135–145)

## 2017-09-15 LAB — GRAM STAIN

## 2017-09-15 LAB — LACTATE DEHYDROGENASE, PLEURAL OR PERITONEAL FLUID: LD FL: 51 U/L — AB (ref 3–23)

## 2017-09-15 LAB — PROTEIN, PLEURAL OR PERITONEAL FLUID

## 2017-09-15 LAB — MAGNESIUM: MAGNESIUM: 1.8 mg/dL (ref 1.7–2.4)

## 2017-09-15 LAB — TSH: TSH: 4.511 u[IU]/mL — ABNORMAL HIGH (ref 0.350–4.500)

## 2017-09-15 MED ORDER — FUROSEMIDE 10 MG/ML IJ SOLN
40.0000 mg | Freq: Every day | INTRAMUSCULAR | Status: DC
  Administered 2017-09-16 – 2017-09-21 (×6): 40 mg via INTRAVENOUS
  Filled 2017-09-15 (×7): qty 4

## 2017-09-15 MED ORDER — ORAL CARE MOUTH RINSE
15.0000 mL | Freq: Two times a day (BID) | OROMUCOSAL | Status: DC
  Administered 2017-09-16 – 2017-09-21 (×7): 15 mL via OROMUCOSAL

## 2017-09-15 MED ORDER — APIXABAN 2.5 MG PO TABS
2.5000 mg | ORAL_TABLET | Freq: Two times a day (BID) | ORAL | Status: DC
  Administered 2017-09-15 – 2017-09-21 (×12): 2.5 mg via ORAL
  Filled 2017-09-15 (×19): qty 1

## 2017-09-15 NOTE — Progress Notes (Addendum)
[facility]  Patient was suppose to see me in clinic today for wound check. Is in hospital with pSBO/ C dif and CHF with rapid A fib causing transfer to ICU. Doing ok this AM but looks weak and more deconditioned.   Right leg wound healed, small area of dry scab, no further wound care needed, can apply a dry kerlix for comfort for the patient if desired    No need to follow up with me in the clinic for wound checks going forward.  If any additional issues with wound, can call clinic.  Updated Dr. Carles Collet. No charge to patient.   Curlene Labrum, MD [facility] [address] [place], [place] [address] [phone] (office)

## 2017-09-15 NOTE — Progress Notes (Signed)
Patient transferred down from 300 for Cardizem drip. Daughters came with patient. I explained to daughters about C Diff precautions and they verbalized understanding. After education given both daughters entered room with no gown or gloves and have been in and out of room multiple times without proper handwashing.   Patient has been ordered Vancomycin enema. She feels that she will not be able to retain this for 1 hour. Dr. Carles Collet gave orders for [facility] for patient. VSS at this time.

## 2017-09-15 NOTE — Progress Notes (Signed)
PROGRESS NOTE  Andrea [name]:[id number] DOB: [birth year] DOA: 09/13/2017 PCP: Unk Pinto, MD  Brief History:  83 year old female with a history of bronchiectasis/COPD, chronic respiratory failure on 2 L at night, hypertension, hyperlipidemia, diastolic CHF, hypothyroidism, and PAT/PAFpresented with 4-5-day history of diarrhea and 1 day history of nausea and vomiting.  The patient was recently discharged from the hospital after a stay from August 26, 2017 through September 01, 2017.  During that hospital stay, the patient was treated for acute on chronic diastolic CHF as well as finishing treatment for C. difficile colitis with metronidazole.  The last day of metronidazole was August 30, 2017.  During that hospitalization, the patient also underwent a left-sided thoracocentesis.  Fluid studies revealed that it was transudate of.  The patient was discharged home with furosemide 20 mg daily.  Unfortunately, the patient began having diarrhea 4-5 days prior to this admission.  The patient went to see her primary care provider and C. difficile assay was positive again.  The patient was restarted on metronidazole.  However, the patient states that she was not taking it consistently.  In addition, the patient intermittently does not take her cardiac medications including but not limited to metoprolol, diltiazem, and furosemide.  The patient complains of dyspnea on exertion, but states that this is not much worse than usual.  In addition, the patient states that her lower extremity edema is about the same as usual.  She denies any hematochezia, hematemesis, fevers, chills, chest pain, headache, neck pain.  Upon presentation, CT of the abdomen and pelvis revealed moderate bilateral pleural effusions, left greater than right.  There was also a dilated fluid-filled small bowel loops up to 5.3 cm with a transition point in the mid to distal ileum concerning for bowel obstruction.  Chest x-ray revealed  increased interstitial prominence with bilateral pleural effusions.    Assessment/Plan: Acute on chronic diastolic CHF -August 26, 2017 echo EF 60-65%, grade 2 DD, moderate TR, PASP 61 -d/c IVF -daily weights--stable since admission_0 , no loss -accurate I/O's--not accurate -fluid restrict -Continue IV Lasix -pt still appears clinically fluid overloaded, now requiring significant increase in oxygen demand in last 24 hours-->consult cardiology -repeat CXR today  Small bowel obstruction -Likely secondary to C. difficile colitis the patient also had a small bowel obstruction July 2018 secondary to adhesions. -Appreciate surgical consultation--> nonoperative management for now -she is a poor surgical candidate -repeat abd xray  Recurrent C. difficile colitis -Continue vancomycin enemas -Continue metronidazole IV -WBC with increase today  Atrial fibrillation with RVR/Atrial tachycardia -Increase Lopressor IV to every 6 hours -Continue Cardizem drip -Check TSH--4.511 -pt refuses Hernando lovenox--wants Eliquis -I have explained to patient x 2 regarding SBO and questionable absorption of po meds and now with renal failure that apixaban is not a good option presently--she continues to refuse enoxaparin -start IV heparin  Acute on chronic respiratory failure with hypoxia -Secondary to CHF in the setting of bronchiectasis -09/14/17 evening-->increased oxygen demand to 12 L HFNC -repeat CXR -Normally on 2 L nasal cannula at home -Wean oxygen back to 2 L for oxygen saturation greater than 92%  COPD/bronchiectasis/chronic respiratory failure with hypoxia -On 2 L nasal cannula at nighttime at home -continue duonebs  Essential hypertension -Holding oral metoprolol and diltiazem initially due to SBO -hydralazine IV prn SBP >180  Anxiety -IV ativan q 8 hrs prn until able to take po xanax  Severe protein calorie malnutrition -Consult nutrition  Goals  of Care -family has  unrealistic expectations -they have dismissed the palliative medicine consultant -remains FULL CODE   Disposition Plan:   remain in SDU  Family Communication: daughter updated at bedside--Total time spent 35 minutes.  Greater than 50% spent face to face counseling and coordinating care.   Consultants:  General surgery; palliative med, cardiology  Code Status:  FULL   DVT Prophylaxis:   [place] Lovenox   Procedures: As Listed in Progress Note Above  Antibiotics: Vanco po/Enema 12/19>>>     Subjective: Patient states that breathing is not much better than yesterday.  She remains short of breath with minimal exertion.  She denies any f/c, cp, nausea, vomiting, abdominal pain, dysuria.  She is passing gas.  She is receiving vancomycin enemas so it is difficult to tell whether she is having  bowel movements.  Objective: Vitals:   09/15/17 1200 09/15/17 1215 09/15/17 1230 09/15/17 1245  BP: 115/63 110/63 (!) 110/58 (!) 120/58  Pulse:   (!) 114 (!) 133  Resp: (!) 27 (!) 22 (!) 22 (!) 24  Temp:      TempSrc:      SpO2:   91% 93%  Weight:      Height:        Intake/Output Summary (Last 24 hours) at 09/15/2017 1306 Last data filed at 09/15/2017 1200 Gross per 24 hour  Intake 402.92 ml  Output -  Net 402.92 ml   Weight change:  Exam:   General:  Pt is alert, follows commands appropriately, not in acute distress  HEENT: No icterus, No thrush, No neck mass, [place]/AT  Cardiovascular: IRRR, S1/S2, no rubs, no gallops  Respiratory: bibasilar crackles, with diminished breath sounds at bases  Abdomen: Soft/+BS, non tender, non distended, no guarding  Extremities: 1+ LE edema, No lymphangitis, No petechiae, No rashes, no synovitis   Data Reviewed: I have personally reviewed following labs and imaging studies Basic Metabolic Panel: Recent Labs  Lab 09/08/17 1502 09/13/17 1638 09/14/17 0404 09/15/17 0407  NA 140 141 142 140  K 4.3 4.1 4.3 4.1  CL 95* 96* 100*  99*  CO2 39* 34* 31 30  GLUCOSE 99 121* 117* 111*  BUN 33* 27* 29* 41*  CREATININE 1.20* 1.10* 1.17* 1.80*  CALCIUM 9.2 9.2 8.1* 8.1*  MG  --   --   --  1.8   Liver Function Tests: Recent Labs  Lab 09/13/17 1638 09/14/17 0404  AST 22 21  ALT 17 15  ALKPHOS 70 54  BILITOT 0.8 0.6  PROT 7.8 6.4*  ALBUMIN 3.2* 2.5*   No results for input(s): LIPASE, AMYLASE in the last 168 hours. No results for input(s): AMMONIA in the last 168 hours. Coagulation Profile: No results for input(s): INR, PROTIME in the last 168 hours. CBC: Recent Labs  Lab 09/08/17 1502 09/13/17 1638 09/14/17 0404 09/15/17 0407  WBC 11.3* 17.1* 13.1* 13.9*  NEUTROABS 8,520* 14.4*  --   --   HGB 10.9* 11.8* 10.4* 10.6*  HCT 34.2* 40.6 36.1 37.4  MCV 89.8 94.9 97.6 99.5  PLT 340 385 352 359   Cardiac Enzymes: Recent Labs  Lab 09/13/17 1638  TROPONINI <0.03   BNP: Invalid input(s): POCBNP CBG: No results for input(s): GLUCAP in the last 168 hours. HbA1C: No results for input(s): HGBA1C in the last 72 hours. Urine analysis:    Component Value Date/Time   COLORURINE YELLOW 09/13/2017 [place] 09/13/2017 1647   LABSPEC 1.010 09/13/2017 [place]  6.0 09/13/2017 1647   GLUCOSEU NEGATIVE 09/13/2017 1647   HGBUR NEGATIVE 09/13/2017 [place] 09/13/2017 [place] 09/13/2017 1647   PROTEINUR 30 (A) 09/13/2017 1647   UROBILINOGEN 0.2 08/20/2014 1613   NITRITE NEGATIVE 09/13/2017 1647   LEUKOCYTESUR TRACE (A) 09/13/2017 1647   Sepsis Labs: _0 (procalcitonin:4,lacticidven:4) ) Recent Results (from the past 240 hour(s))  Blood culture (routine x 2)     Status: None (Preliminary result)   Collection Time: 09/13/17  4:38 PM  Result Value Ref Range Status   Specimen Description BLOOD RIGHT FOREARM  Final   Special Requests   Final    BOTTLES DRAWN AEROBIC AND ANAEROBIC Blood Culture adequate volume   Culture NO GROWTH 2 DAYS  Final    Report Status PENDING  Incomplete  Blood culture (routine x 2)     Status: None (Preliminary result)   Collection Time: 09/13/17  4:50 PM  Result Value Ref Range Status   Specimen Description LEFT ANTECUBITAL  Final   Special Requests   Final    BOTTLES DRAWN AEROBIC AND ANAEROBIC Blood Culture adequate volume   Culture NO GROWTH 2 DAYS  Final   Report Status PENDING  Incomplete  MRSA PCR Screening     Status: None   Collection Time: 09/14/17  2:20 PM  Result Value Ref Range Status   MRSA by PCR NEGATIVE NEGATIVE Final    Comment:        The GeneXpert MRSA Assay (FDA approved for NASAL specimens only), is one component of a comprehensive MRSA colonization surveillance program. It is not intended to diagnose MRSA infection nor to guide or monitor treatment for MRSA infections.      Scheduled Meds: . diltiazem  5 mg Intravenous Once  . enoxaparin (LOVENOX) injection  1 mg/kg Subcutaneous Q24H  . furosemide  40 mg Intravenous BID  . hydrocerin   Topical BID  . ipratropium-albuterol  3 mL Nebulization BID  . levothyroxine  25 mcg Intravenous QAC breakfast  . metoprolol tartrate  2.5 mg Intravenous Q6H  . pantoprazole (PROTONIX) IV  40 mg Intravenous Q24H  . vancomycin (VANCOCIN) rectal ENEMA  500 mg Rectal Q6H   Continuous Infusions: . diltiazem (CARDIZEM) infusion 5 mg/hr (09/15/17 1200)  . metronidazole Stopped (09/15/17 0919)    Procedures/Studies: Dg Chest 1 View  Result Date: 08/29/2017 INDICATION: Pleural effusions resistant to diuresis. EXAM: ULTRASOUND GUIDED LEFT THORACENTESIS MEDICATIONS: None. COMPLICATIONS: None immediate. PROCEDURE: An ultrasound guided thoracentesis was thoroughly discussed with the patient and questions answered. The benefits, risks, alternatives and complications were also discussed. The patient is at increased bleeding risk due to Eliquis use and we had a lengthy discussion about this; the patient's husband was also called on the telephone.  The patient understands and wishes to proceed with the procedure. Written consent was obtained. Ultrasound was performed to localize and mark an adequate pocket of fluid in the left chest. The area was then prepped and draped in the normal sterile fashion. 1% Lidocaine was used for local anesthesia. Under ultrasound guidance a 19 gauge, 7-cm, Yueh catheter was introduced. Thoracentesis was performed until the available fluid was drained. I scanned the left chest and there was no evidence of bleeding. The catheter was removed and a dressing applied. FINDINGS: A total of approximately 1 L of amber fluid was removed. Samples were sent to the laboratory as requested by the clinical team. A chest x-ray was obtained and shows significant decrease in left pleural effusion,  with small volume fluid remaining. No visible pneumothorax. There is a moderate right pleural effusion. Cardiomegaly and vascular congestion. IMPRESSION: 1. Successful ultrasound guided left thoracentesis yielding 1 L of pleural fluid. 2. Minimal fluid remaining on the left. Right pleural effusion is moderate. Electronically Signed   By: Monte Fantasia M.D.   On: 08/29/2017 11:35   Dg Chest 2 View  Result Date: 09/01/2017 CLINICAL DATA:  Pleural effusions EXAM: CHEST  2 VIEW COMPARISON:  08/30/2017 CXR FINDINGS: Stable cardiac silhouette partially obscured by bilateral pleural effusions. Aortic atherosclerosis is noted. Decrease in size of small right pleural effusion with slight increase in left-sided pleural effusion. Adjacent bibasilar atelectasis. Interval decrease in pulmonary vascular redistribution. Osteoarthritis of the [facility] and glenohumeral joint on the right with high-riding right humeral head and remodeling of the undersurface of the acromion. Findings likely reflect chronic rotator cuff tears. IMPRESSION: 1. Slight decrease in right small pleural effusion and minimal increase in small left pleural effusion since prior. 2. Interval decrease  in CHF. 3. Aortic atherosclerosis. Electronically Signed   By: Ashley Royalty M.D.   On: 09/01/2017 14:28   Dg Chest 2 View  Result Date: 08/26/2017 CLINICAL DATA:  Patient having increased SOB over the past few days. Patient is on oxygen at home and states that she usually only wears it at night but has been wearing it all day since having SOB. Hx of pna, htn-controlled with medication, COPD. Ex-smoker. EXAM: CHEST  2 VIEW COMPARISON:  06/28/2017 FINDINGS: The lungs are hyperinflated likely secondary to COPD. There are moderate bilateral pleural effusions with bibasilar atelectasis. There is bilateral diffuse interstitial thickening. There is no pneumothorax. Stable cardiomediastinal silhouette. There is no acute osseous abnormality. There is severe osteoarthritis of the right glenohumeral joint. There is mild osteoarthritis of the left glenohumeral joint. IMPRESSION: 1. Findings consistent with CHF. Electronically Signed   By: Kathreen Devoid   On: 08/26/2017 13:36   Ct Abdomen Pelvis W Contrast  Result Date: 09/13/2017 CLINICAL DATA:  Abdominal distension with vomiting and diarrhea. Shortness of breath. EXAM: CT ABDOMEN AND PELVIS WITH CONTRAST TECHNIQUE: Multidetector CT imaging of the abdomen and pelvis was performed using the standard protocol following bolus administration of intravenous contrast. CONTRAST:  159m ISOVUE-300 IOPAMIDOL (ISOVUE-300) INJECTION 61% COMPARISON:  08/19/2017 FINDINGS: Lower chest: Emphysema. Persistent bibasilar airspace disease. Mild cardiomegaly with LAD coronary artery atherosclerosis. Moderate bilateral pleural effusions, larger on the left. Similar. Hepatobiliary: Perfusion anomaly involving the anterior left hepatic lobe image 33/ series 2. Normal gallbladder, without biliary ductal dilatation. Pancreas: Normal, without mass or ductal dilatation. Spleen: Normal in size, without focal abnormality. Adrenals/Urinary Tract: Normal right adrenal gland. Minimal left adrenal  thickening and nodularity. Bilateral renal cortical thinning. Right renal lesions are favored to represent cysts or minimally complex cysts. Bilateral too small to characterize renal lesions. No hydronephrosis. Normal urinary bladder. Stomach/Bowel: Normal stomach, without wall thickening. Scattered colonic diverticula. The colon is relatively decompressed. Small bowel loops are fluid-filled and dilated, including at up to 5.3 cm. A transition point is identified within the mid to distal ileum, including on images 73 through 77/series 2. No bowel wall thickening to suggest complicating ischemia. No pneumatosis or free intraperitoneal air. Vascular/Lymphatic: Aortic and branch vessel atherosclerosis. No abdominopelvic adenopathy. Reproductive: Hysterectomy. Other: Small volume cul-de-sac fluid including on 77/series 2. Pelvic floor laxity. Musculoskeletal: Osteopenia.  Convex right lumbar spine curvature. IMPRESSION: 1. High-grade partial small bowel obstruction involving the mid to distal ileum. Likely due to adhesions. Although there is  adjacent ascites, no specific evidence of complicating ischemia identified. 2. Bilateral pleural effusions and bibasilar atelectasis, as before. 3. Coronary artery atherosclerosis. Aortic Atherosclerosis (ICD10-I70.0). 4. Pelvic floor laxity. Electronically Signed   By: Abigail Miyamoto M.D.   On: 09/13/2017 20:47   Ct Abdomen Pelvis W Contrast  Result Date: 08/19/2017 CLINICAL DATA:  Diarrhea for 1 week. EXAM: CT ABDOMEN AND PELVIS WITH CONTRAST TECHNIQUE: Multidetector CT imaging of the abdomen and pelvis was performed using the standard protocol following bolus administration of intravenous contrast. CONTRAST:  80 ml ISOVUE-300 IOPAMIDOL (ISOVUE-300) INJECTION 61% COMPARISON:  CT abdomen and pelvis 04/08/2017. FINDINGS: Lower chest: Small to moderate bilateral pleural effusions are present, larger on the left, and increased since the prior CT. Lung bases demonstrate  emphysematous disease. Heart size is enlarged. Calcific coronary artery disease is noted. Compressive atelectasis in lung bases is present Hepatobiliary: No focal liver abnormality is seen. 1 cm hypervascular lesion in the anterior left hepatic lobe is better visualized on the prior CT. No gallstones, gallbladder wall thickening, or biliary dilatation. Pancreas: Unremarkable. No pancreatic ductal dilatation or surrounding inflammatory changes. Spleen: Normal in size without focal abnormality. Adrenals/Urinary Tract: Multiple low attenuating lesions in the right kidney are likely cysts and unchanged. No hydronephrosis. The right kidney is ptotic, unchanged. Urinary bladder appears normal. Adrenal glands are unremarkable. Stomach/Bowel: Extensive diverticulosis without diverticulitis is worst in the sigmoid. The colon otherwise appears normal. Stomach and small bowel appear normal. No evidence of appendicitis. Vascular/Lymphatic: Aortic atherosclerosis. No enlarged abdominal or pelvic lymph nodes. Reproductive: Status post hysterectomy. No adnexal masses. Other: No ascites. Musculoskeletal: Severe convex right scoliosis noted. No lytic or sclerotic lesion. No acute abnormality. IMPRESSION: No acute abnormality abdomen or pelvis. Small to moderate bilateral pleural effusions, larger on the left, increased since the prior CT. Diverticulosis without diverticulitis. Calcific aortic and coronary atherosclerosis. Electronically Signed   By: Inge Rise M.D.   On: 08/19/2017 18:46   Dg Chest Portable 1 View  Result Date: 09/13/2017 CLINICAL DATA:  Shortness of breath.  Vomiting and diarrhea. EXAM: PORTABLE CHEST 1 VIEW COMPARISON:  09/01/2017 FINDINGS: Small to moderate bilateral pleural effusions, increased. Diffuse interstitial and airspace opacity, increased. Probable cardiomegaly, although partially obscured. No pneumothorax. IMPRESSION: CHF pattern including small to moderate pleural effusions. Electronically  Signed   By: Monte Fantasia M.D.   On: 09/13/2017 17:53   Dg Chest Port 1 View  Result Date: 08/30/2017 CLINICAL DATA:  COPD.  Pleural effusion. EXAM: PORTABLE CHEST 1 VIEW COMPARISON:  08/29/2017. FINDINGS: Cardiomegaly with pulmonary venous congestion and bilateral interstitial prominence with bilateral pleural effusions again noted. Findings consistent with CHF. No significant change from prior study of 08/29/2017. No evidence of pneumothorax status post recent thoracentesis. IMPRESSION: 1. Persistent cardiomegaly with pulmonary venous congestion, bilateral interstitial prominence and bilateral pleural effusions again noted without significant change. Findings consistent CHF. 2. No evidence of progression of pleural effusions. No pneumothorax status post recent left thoracentesis. Electronically Signed   By: Marcello Moores  Register   On: 08/30/2017 08:43   Dg Chest Port 1 View  Result Date: 08/28/2017 CLINICAL DATA:  Hypoxemia, bilateral pleural effusions EXAM: PORTABLE CHEST 1 VIEW COMPARISON:  08/27/2017 FINDINGS: Moderate to large bilateral pleural effusions. Diffuse bilateral airspace disease again noted. No real change since prior study. Cardiomegaly. IMPRESSION: Severe diffuse bilateral airspace disease and a large effusions. No real change. Electronically Signed   By: Rolm Baptise M.D.   On: 08/28/2017 10:27   Dg Chest [facility] [address]  Result Date: 08/27/2017 CLINICAL DATA:  Pleural effusion. EXAM: PORTABLE CHEST 1 VIEW COMPARISON:  08/26/2017 FINDINGS: Moderate bilateral pleural effusions. Diffuse bilateral airspace disease, likely edema/ CHF. Cardiomegaly. Slight interval worsening edema since prior study. IMPRESSION: Worsening aeration of the lungs compatible with worsening edema/ CHF. Moderate bilateral effusions are stable. Electronically Signed   By: Rolm Baptise M.D.   On: 08/27/2017 13:19   Dg Abd 2 Views  Result Date: 09/14/2017 CLINICAL DATA:  Small-bowel obstruction EXAM: ABDOMEN - 2 VIEW  COMPARISON:  CT abdomen 09/13/2017 FINDINGS: Dilated small bowel loops in the pelvis similar to improved from the prior study. Contrast in the renal collecting system bilaterally from recent CT. No renal obstruction. Moderate lumbar scoliosis.  No acute skeletal abnormality. IMPRESSION: Small bowel dilatation similar to slightly improved from yesterday. Electronically Signed   By: Franchot Gallo M.D.   On: 09/14/2017 07:48   US Thoracentesis Asp Pleural Space W/img Guide  Result Date: 08/29/2017 INDICATION: Pleural effusions resistant to diuresis. EXAM: ULTRASOUND GUIDED LEFT THORACENTESIS MEDICATIONS: None. COMPLICATIONS: None immediate. PROCEDURE: An ultrasound guided thoracentesis was thoroughly discussed with the patient and questions answered. The benefits, risks, alternatives and complications were also discussed. The patient is at increased bleeding risk due to Eliquis use and we had a lengthy discussion about this; the patient's husband was also called on the telephone. The patient understands and wishes to proceed with the procedure. Written consent was obtained. Ultrasound was performed to localize and mark an adequate pocket of fluid in the left chest. The area was then prepped and draped in the normal sterile fashion. 1% Lidocaine was used for local anesthesia. Under ultrasound guidance a 19 gauge, 7-cm, Yueh catheter was introduced. Thoracentesis was performed until the available fluid was drained. I scanned the left chest and there was no evidence of bleeding. The catheter was removed and a dressing applied. FINDINGS: A total of approximately 1 L of amber fluid was removed. Samples were sent to the laboratory as requested by the clinical team. A chest x-ray was obtained and shows significant decrease in left pleural effusion, with small volume fluid remaining. No visible pneumothorax. There is a moderate right pleural effusion. Cardiomegaly and vascular congestion. IMPRESSION: 1. Successful  ultrasound guided left thoracentesis yielding 1 L of pleural fluid. 2. Minimal fluid remaining on the left. Right pleural effusion is moderate. Electronically Signed   By: Monte Fantasia M.D.   On: 08/29/2017 11:35    Orson Eva, DO  [facility] Pager [phone]  If 7PM-7AM, please contact night-coverage [email or web address] Password [email or web address] 09/15/2017, 1:06 PM   LOS: 2 days

## 2017-09-15 NOTE — Progress Notes (Addendum)
I had an additional long family discussion with the patient's daughters I updated the patient's daughters regarding the patient's medical condition I explained my opinion regarding the patient's poor prognosis secondary to her recurrent hospitalizations, frailty, age, and multiple comorbidities.  Although the patient's family initially wanted to dismiss me as a physician and transfer the patient to tertiary care center, I explained to them that unless there is a clear clinical reason or service that cannot be performed at this hospital, immediate transfer to tertiary care center is not indicated at this time.  In addition, it is the policy of our hospitalist group not to allow switching of physicians unless there is a clear reason such that continue current care would compromise the patient's clinical condition. This was verified with Dr. Davina Poke, [facility] director at [facility]. I informed the family that pulmonary medicine and cardiology will be consulted to assist with management. In general, the patient's family appears to have unrealistic expectations.  The patient remains full CODE STATUS.  Total time--32 min.  330pm to 402 pm  DTat

## 2017-09-15 NOTE — Telephone Encounter (Signed)
Returned the call to the patient's daughter, per the dpr. She stated that the patient is currently at [facility]. She would like for Dr. Stanford Breed to know that she is in the hospital. She did mention that she may want the patient transferred to [facility] but has been made aware that the patient cannot travel by EMS until medically stable. She has been reassured that Dr. Jacalyn Lefevre colleagues visit patients at [facility]. She has been instructed to call back if anything else is needed. Will route to the provider for his knowledge.

## 2017-09-15 NOTE — Telephone Encounter (Signed)
New message   Pt daughter Andrea [name] verbalized that she is calling because her mother is admitted at [facility] in ICU room 6  She said that her mother received some grim news by a cardiologist that is not her Doctor and    and it was given in a way that was very poorly pt daughter Andrea [name] want Dr.Crenshaw   To review everything and see if she can be transferred in his care

## 2017-09-15 NOTE — Progress Notes (Signed)
Subjective: Patient denies any abdominal pain.  Requesting to go home.  Objective: Vital signs in last 24 hours: Temp:  [97.7 F (36.5 C)-97.9 F (36.6 C)] 97.9 F (36.6 C) (12/20 0400) Pulse Rate:  [44-124] 117 (12/20 0700) Resp:  [17-33] 25 (12/20 0700) BP: (99-117)/(45-75) 101/67 (12/20 0700) SpO2:  [78 %-100 %] 91 % (12/20 0825) Last BM Date: 09/13/17  Intake/Output from previous day: 12/19 0701 - 12/20 0700 In: 246.7 [I.V.:46.7; IV Piggyback:200] Out: -  Intake/Output this shift: No intake/output data recorded.  General appearance: alert, cooperative and no distress GI: Soft, nontender.  Slightly distended but not tense.  Occasional bowel sounds appreciated.  Lab Results:  Recent Labs    09/14/17 0404 09/15/17 0407  WBC 13.1* 13.9*  HGB 10.4* 10.6*  HCT 36.1 37.4  PLT 352 359   BMET Recent Labs    09/14/17 0404 09/15/17 0407  NA 142 140  K 4.3 4.1  CL 100* 99*  CO2 31 30  GLUCOSE 117* 111*  BUN 29* 41*  CREATININE 1.17* 1.80*  CALCIUM 8.1* 8.1*   PT/INR No results for input(s): LABPROT, INR in the last 72 hours.  Studies/Results: Ct Abdomen Pelvis W Contrast  Result Date: 09/13/2017 CLINICAL DATA:  Abdominal distension with vomiting and diarrhea. Shortness of breath. EXAM: CT ABDOMEN AND PELVIS WITH CONTRAST TECHNIQUE: Multidetector CT imaging of the abdomen and pelvis was performed using the standard protocol following bolus administration of intravenous contrast. CONTRAST:  178m ISOVUE-300 IOPAMIDOL (ISOVUE-300) INJECTION 61% COMPARISON:  08/19/2017 FINDINGS: Lower chest: Emphysema. Persistent bibasilar airspace disease. Mild cardiomegaly with LAD coronary artery atherosclerosis. Moderate bilateral pleural effusions, larger on the left. Similar. Hepatobiliary: Perfusion anomaly involving the anterior left hepatic lobe image 33/ series 2. Normal gallbladder, without biliary ductal dilatation. Pancreas: Normal, without mass or ductal dilatation.  Spleen: Normal in size, without focal abnormality. Adrenals/Urinary Tract: Normal right adrenal gland. Minimal left adrenal thickening and nodularity. Bilateral renal cortical thinning. Right renal lesions are favored to represent cysts or minimally complex cysts. Bilateral too small to characterize renal lesions. No hydronephrosis. Normal urinary bladder. Stomach/Bowel: Normal stomach, without wall thickening. Scattered colonic diverticula. The colon is relatively decompressed. Small bowel loops are fluid-filled and dilated, including at up to 5.3 cm. A transition point is identified within the mid to distal ileum, including on images 73 through 77/series 2. No bowel wall thickening to suggest complicating ischemia. No pneumatosis or free intraperitoneal air. Vascular/Lymphatic: Aortic and branch vessel atherosclerosis. No abdominopelvic adenopathy. Reproductive: Hysterectomy. Other: Small volume cul-de-sac fluid including on 77/series 2. Pelvic floor laxity. Musculoskeletal: Osteopenia.  Convex right lumbar spine curvature. IMPRESSION: 1. High-grade partial small bowel obstruction involving the mid to distal ileum. Likely due to adhesions. Although there is adjacent ascites, no specific evidence of complicating ischemia identified. 2. Bilateral pleural effusions and bibasilar atelectasis, as before. 3. Coronary artery atherosclerosis. Aortic Atherosclerosis (ICD10-I70.0). 4. Pelvic floor laxity. Electronically Signed   By: KAbigail MiyamotoM.D.   On: 09/13/2017 20:47   Dg Chest Portable 1 View  Result Date: 09/13/2017 CLINICAL DATA:  Shortness of breath.  Vomiting and diarrhea. EXAM: PORTABLE CHEST 1 VIEW COMPARISON:  09/01/2017 FINDINGS: Small to moderate bilateral pleural effusions, increased. Diffuse interstitial and airspace opacity, increased. Probable cardiomegaly, although partially obscured. No pneumothorax. IMPRESSION: CHF pattern including small to moderate pleural effusions. Electronically Signed   By:  JMonte FantasiaM.D.   On: 09/13/2017 17:53   Dg Abd 2 Views  Result Date: 09/14/2017 CLINICAL DATA:  Small-bowel  obstruction EXAM: ABDOMEN - 2 VIEW COMPARISON:  CT abdomen 09/13/2017 FINDINGS: Dilated small bowel loops in the pelvis similar to improved from the prior study. Contrast in the renal collecting system bilaterally from recent CT. No renal obstruction. Moderate lumbar scoliosis.  No acute skeletal abnormality. IMPRESSION: Small bowel dilatation similar to slightly improved from yesterday. Electronically Signed   By: Franchot Gallo M.D.   On: 09/14/2017 07:48    Anti-infectives: Anti-infectives (From admission, onward)   Start     Dose/Rate Route Frequency Ordered Stop   09/14/17 1200  vancomycin (VANCOCIN) 500 mg in sodium chloride irrigation 0.9 % 100 mL ENEMA     500 mg Rectal Every 6 hours 09/14/17 1040     09/14/17 0100  metroNIDAZOLE (FLAGYL) IVPB 500 mg     500 mg 100 mL/hr over 60 Minutes Intravenous Every 8 hours 09/14/17 0029     09/13/17 1830  cefTRIAXone (ROCEPHIN) 1 g in dextrose 5 % 50 mL IVPB     1 g 100 mL/hr over 30 Minutes Intravenous  Once 09/13/17 1818 09/13/17 1922      Assessment/Plan: Impression: Partial small bowel obstruction, C. difficile colitis.  Patient slightly improved.  Leukocytosis resolving.  Events of last night noted.  No need for acute surgical intervention.  We will try to avoid surgery as patient is a high risk surgical candidate.  We will advance to full liquid diet.  LOS: 2 days    Aviva Signs 09/15/2017

## 2017-09-15 NOTE — Consult Note (Signed)
CARDIOLOGY CONSULT NOTE    Patient ID: MARTHE DANT; [id number]; September 02, 1929   Admit date: 09/13/2017 Date of Consult: 09/15/2017  Primary Care Provider: Unk Pinto, MD Primary Cardiologist: Kirk Ruths, MD  Patient Profile:   PAT ELICKER is a 83 y.o. female with a hx of palpitations, hypertension, paroxysmal atrial fibrillation on Eliquis, , AI.  SVT, converted to normal sinus rhythm on metoprolol and diltiazem, COPD, hypothyroidism, recent treatment for C-diff, and SBO in 2018, pleural effusion requiring thoracentesis 08/29/2017.[name]  Who is being seen today for the evaluation of atrial fibrillation with diastolic CHF in the setting of small bowel obstruction at the request of Dr. Carles Collet   History of Present Illness:   Ms. Clewis to the emergency room with complaints of shortness of breath, nausea vomiting and abdominal distention.  She had been continued on treatment with p.o. Flagyl in the setting of C. difficile.  EKG on arrival revealed atrial fibrillation with RVR heart rate of 136 bpm with LVH.  On arrival to the emergency room patient's blood pressure was 131/73, heart rate 138 bpm atrial fibrillation, O2 sat 75%, she was tachypneic and afebrile.  Labs, she did have proteinuria but no evidence of UTI.  Hemoglobin 11.8 hematocrit 40.6 white blood cells 17.1, platelets 385.  Series revealed a creatinine of 1.10, glucose 121, potassium 4.1.  Albumin low at 3.2.  CT scan of the abdomen revealed high-grade partial small bowel obstruction, involving the mid to distal ileum.  Likely due to adhesions.  Ascites was also noted.  She was also found to have bilateral pleural effusions.  X-ray confirmed moderate bilateral effusions, and CHF.  Was initially treated with lorazepam IV, started on antibiotics with Rocephin 1 g IV.  Seen by Dr. Arnoldo Morale, surgeon, who wishes to avoid surgical intervention as he finds her to be high risk.  She is recently been progressed to a liquid diet.  She  is also currently on diltiazem drip 5 mg an hour,.  Has been receiving Lasix 2 mg IV twice daily.  She is positive on I/O of 156 cc overall 1.7 L.  Currently denies abdominal pain, nausea vomiting, she is not currently having flatus.  She denies recurrent diarrhea at this time.  Past Medical History:  Diagnosis Date  . Aortic regurgitation   . Bronchiectasis ([place])   . COPD (chronic obstructive pulmonary disease) ([place])   . DJD (degenerative joint disease)   . HLD (hyperlipidemia)   . HTN (hypertension)   . Hypothyroidism   . Palpitations    a. has known history of PAC's and PVC's. b. 09/2015: monitor showed sinus rhythm with occasional PAC's and a brief episode of PAT.    Past Surgical History:  Procedure Laterality Date  . ABDOMINAL HYSTERECTOMY  1993  . CATARACT EXTRACTION, BILATERAL    . WOUND DEBRIDEMENT Right 06/29/2017   Procedure: RIGHT LOWER  LEG DEBRIDEMENT;  Surgeon: [name], MD;  Location: [facility];  Service: General;  Laterality: Right;     Home Medications:  Prior to Admission medications   Medication Sig Start Date End Date Taking? Authorizing Provider  ALPRAZolam (XANAX) 0.25 MG tablet Take 0.125-0.25 mg by mouth at bedtime.    Yes [provider]  apixaban (ELIQUIS) 2.5 MG TABS tablet Take 1 tablet (2.5 mg total) by mouth 2 (two) times daily. 05/26/17  Yes Lelon Perla, MD  diltiazem (CARDIZEM) 60 MG tablet Take 60 mg by mouth 4 (four) times daily.  05/31/17  Yes  [provider]  furosemide (LASIX) 20 MG tablet Take 1 tablet (20 mg total) by mouth daily. 09/01/17  Yes Dhungel, Nishant, MD  levothyroxine (SYNTHROID, LEVOTHROID) 50 MCG tablet TAKE ONE TABLET BY MOUTH ONCE A DAY AS DIRECTED 08/09/17  Yes Unk Pinto, MD  Metoprolol Tartrate 37.5 MG TABS Take 37.5 mg by mouth 2 (two) times daily. 05/31/17  Yes Lelon Perla, MD  Probiotic Product (PROBIOTIC-10) CHEW Chew 2 tablets by mouth daily.   Yes [provider]    metroNIDAZOLE (FLAGYL) 500 MG tablet Take 1 tablet 3 x / day after meals for infection Patient not taking: Reported on 09/13/2017 09/12/17 09/26/17  Unk Pinto, MD  silver sulfADIAZINE (SILVADENE) 1 % cream Apply to affected area daily Patient not taking: Reported on 09/13/2017 07/14/17 07/14/18  [name], MD  traMADol (ULTRAM) 50 MG tablet Take 1 tablet (50 mg total) by mouth every 6 (six) hours as needed. 07/07/17   [name], MD    Inpatient Medications: Scheduled Meds: . diltiazem  5 mg Intravenous Once  . enoxaparin (LOVENOX) injection  1 mg/kg Subcutaneous Q24H  . furosemide  40 mg Intravenous BID  . hydrocerin   Topical BID  . ipratropium-albuterol  3 mL Nebulization BID  . levothyroxine  25 mcg Intravenous QAC breakfast  . metoprolol tartrate  2.5 mg Intravenous Q6H  . pantoprazole (PROTONIX) IV  40 mg Intravenous Q24H  . vancomycin (VANCOCIN) rectal ENEMA  500 mg Rectal Q6H   Continuous Infusions: . diltiazem (CARDIZEM) infusion 5 mg/hr (09/15/17 1200)  . metronidazole Stopped (09/15/17 0919)   PRN Meds: acetaminophen **OR** acetaminophen, ALPRAZolam, LORazepam, ondansetron **OR** ondansetron (ZOFRAN) IV  Allergies:    Allergies  Allergen Reactions  . Vancomycin     Worsening kidney function  . Sulfa Antibiotics Palpitations    Unknown    Social History:   Social History   Socioeconomic History  . Marital status: Married    Spouse name: Not on file  . Number of children: 5  . Years of education: Not on file  . Highest education level: Not on file  Social Needs  . Financial resource strain: Not on file  . Food insecurity - worry: Not on file  . Food insecurity - inability: Not on file  . Transportation needs - medical: Not on file  . Transportation needs - non-medical: Not on file  Occupational History  . Occupation: retired  Tobacco Use  . Smoking status: Former Smoker    Packs/day: 1.00    Years: 20.00    Pack years: 20.00     Types: Cigarettes    Last attempt to quit: 09/27/1974    Years since quitting: 42.9  . Smokeless tobacco: Never Used  Substance and Sexual Activity  . Alcohol use: No  . Drug use: No  . Sexual activity: No  Other Topics Concern  . Not on file  Social History Narrative  . Not on file    Family History:    Family History  Problem Relation Age of Onset  . Emphysema Mother   . Diabetes Mother   . Cancer Father   . Emphysema Sister   . Emphysema Sister   . Rheumatic fever Sister   . Heart attack Sister   . Diabetes Sister   . Diabetes Brother   . Asthma Brother        as a child     ROS:  Please see the history of present illness.  ROS  All other ROS reviewed and negative.     Physical Exam/Data:   Vitals:   09/15/17 1200 09/15/17 1215 09/15/17 1230 09/15/17 1245  BP: 115/63 110/63 (!) 110/58 (!) 120/58  Pulse:   (!) 114 (!) 133  Resp: (!) 27 (!) 22 (!) 22 (!) 24  Temp:      TempSrc:      SpO2:   91% 93%  Weight:      Height:        Intake/Output Summary (Last 24 hours) at 09/15/2017 1350 Last data filed at 09/15/2017 1200 Gross per 24 hour  Intake 402.92 ml  Output -  Net 402.92 ml   Filed Weights   09/13/17 1622  Weight: 105 lb (47.6 kg)   Body mass index is 18.6 kg/m.  General:  Well nourished, well developed, in no acute distress, frail, thin HEENT: normal Lymph: no adenopathy Neck: no JVD Endocrine:  No thryomegaly Vascular: No carotid bruits; FA pulses 2+ bilaterally without bruits  Cardiac:  normal S1, S2; IRRR; 1/6 systolic murmur. Lungs: Clear to auscultation bilaterally, no wheezing, rhonchi or rales  Abd: soft, nontender, no hepatomegaly hypoactive to absent bowel sounds Ext: no edema Musculoskeletal:  No deformities, BUE and BLE strength normal and equal Skin: warm and dry  Neuro:  CNs 2-12 intact, no focal abnormalities noted Psych:  Normal affect   EKG:  The EKG was personally reviewed and demonstrates: Atrial fib with RVR, with  LVH. Telemetry:  Telemetry was personally reviewed and demonstrates: Atrial fibrillation heart rate between 90 and 110 bpm.  Relevant CV Studies: Echocardiogram 08/26/2017 Left ventricle: The cavity size was normal. Wall thickness was   normal. Systolic function was normal. The estimated ejection   fraction was in the range of 60% to 65%. Wall motion was normal;   there were no regional wall motion abnormalities. Features are   consistent with a pseudonormal left ventricular filling pattern,   with concomitant abnormal relaxation and increased filling   pressure (grade 2 diastolic dysfunction). - Aortic valve: Mildly calcified annulus. Trileaflet. There was   mild regurgitation. - Mitral valve: There was mild regurgitation. - Left atrium: The atrium was at the upper limits of normal in   size. - Right atrium: Central venous pressure (est): 3 mm Hg. - Tricuspid valve: There was moderate regurgitation. - Pulmonary arteries: PA peak pressure: 61 mm Hg (S). - Pericardium, extracardiac: A small pericardial effusion was   identified. There was a right pleural effusion. There was a left   pleural effusion.  Cardiac Event Monitor 10/02/2015 Sinus rhythm with pacs and brief PAT   Laboratory Data:  Chemistry Recent Labs  Lab 09/13/17 1638 09/14/17 0404 09/15/17 0407  NA 141 142 140  K 4.1 4.3 4.1  CL 96* 100* 99*  CO2 34* 31 30  GLUCOSE 121* 117* 111*  BUN 27* 29* 41*  CREATININE 1.10* 1.17* 1.80*  CALCIUM 9.2 8.1* 8.1*  GFRNONAA 44* 41* 24*  GFRAA 51* 47* 28*  ANIONGAP _0 Recent Labs  Lab 09/13/17 1638 09/14/17 0404  PROT 7.8 6.4*  ALBUMIN 3.2* 2.5*  AST 22 21  ALT 17 15  ALKPHOS 70 54  BILITOT 0.8 0.6   Hematology Recent Labs  Lab 09/13/17 1638 09/14/17 0404 09/15/17 0407  WBC 17.1* 13.1* 13.9*  RBC 4.28 3.70* 3.76*  HGB 11.8* 10.4* 10.6*  HCT 40.6 36.1 37.4  MCV 94.9 97.6 99.5  MCH 27.6 28.1 28.2  MCHC 29.1* 28.8* 28.3*  RDW 14.4 14.6 14.6  PLT  385 352 359   Cardiac Enzymes Recent Labs  Lab 09/13/17 1638  TROPONINI <0.03   No results for input(s): TROPIPOC in the last 168 hours.  BNPNo results for input(s): BNP, PROBNP in the last 168 hours.  DDimer No results for input(s): DDIMER in the last 168 hours.  Radiology/Studies:  Ct Abdomen Pelvis W Contrast  Result Date: 09/13/2017 CLINICAL DATA:  Abdominal distension with vomiting and diarrhea. Shortness of breath. EXAM: CT ABDOMEN AND PELVIS WITH CONTRAST TECHNIQUE: Multidetector CT imaging of the abdomen and pelvis was performed using the standard protocol following bolus administration of intravenous contrast. CONTRAST:  130m ISOVUE-300 IOPAMIDOL (ISOVUE-300) INJECTION 61% COMPARISON:  08/19/2017 FINDINGS: Lower chest: Emphysema. Persistent bibasilar airspace disease. Mild cardiomegaly with LAD coronary artery atherosclerosis. Moderate bilateral pleural effusions, larger on the left. Similar. Hepatobiliary: Perfusion anomaly involving the anterior left hepatic lobe image 33/ series 2. Normal gallbladder, without biliary ductal dilatation. Pancreas: Normal, without mass or ductal dilatation. Spleen: Normal in size, without focal abnormality. Adrenals/Urinary Tract: Normal right adrenal gland. Minimal left adrenal thickening and nodularity. Bilateral renal cortical thinning. Right renal lesions are favored to represent cysts or minimally complex cysts. Bilateral too small to characterize renal lesions. No hydronephrosis. Normal urinary bladder. Stomach/Bowel: Normal stomach, without wall thickening. Scattered colonic diverticula. The colon is relatively decompressed. Small bowel loops are fluid-filled and dilated, including at up to 5.3 cm. A transition point is identified within the mid to distal ileum, including on images 73 through 77/series 2. No bowel wall thickening to suggest complicating ischemia. No pneumatosis or free intraperitoneal air. Vascular/Lymphatic: Aortic and branch vessel  atherosclerosis. No abdominopelvic adenopathy. Reproductive: Hysterectomy. Other: Small volume cul-de-sac fluid including on 77/series 2. Pelvic floor laxity. Musculoskeletal: Osteopenia.  Convex right lumbar spine curvature. IMPRESSION: 1. High-grade partial small bowel obstruction involving the mid to distal ileum. Likely due to adhesions. Although there is adjacent ascites, no specific evidence of complicating ischemia identified. 2. Bilateral pleural effusions and bibasilar atelectasis, as before. 3. Coronary artery atherosclerosis. Aortic Atherosclerosis (ICD10-I70.0). 4. Pelvic floor laxity. Electronically Signed   By: KAbigail MiyamotoM.D.   On: 09/13/2017 20:47   Dg Chest Portable 1 View  Result Date: 09/13/2017 CLINICAL DATA:  Shortness of breath.  Vomiting and diarrhea. EXAM: PORTABLE CHEST 1 VIEW COMPARISON:  09/01/2017 FINDINGS: Small to moderate bilateral pleural effusions, increased. Diffuse interstitial and airspace opacity, increased. Probable cardiomegaly, although partially obscured. No pneumothorax. IMPRESSION: CHF pattern including small to moderate pleural effusions. Electronically Signed   By: JMonte FantasiaM.D.   On: 09/13/2017 17:53   Dg Abd 2 Views  Result Date: 09/14/2017 CLINICAL DATA:  Small-bowel obstruction EXAM: ABDOMEN - 2 VIEW COMPARISON:  CT abdomen 09/13/2017 FINDINGS: Dilated small bowel loops in the pelvis similar to improved from the prior study. Contrast in the renal collecting system bilaterally from recent CT. No renal obstruction. Moderate lumbar scoliosis.  No acute skeletal abnormality. IMPRESSION: Small bowel dilatation similar to slightly improved from yesterday. Electronically Signed   By: CFranchot GalloM.D.   On: 09/14/2017 07:48    Assessment and Plan:   1.  Atrial fib with RVR: Patient has chronic persistent atrial fib, however heart rate was extremely elevated in the setting of C. difficile infection along with nausea vomiting and small bowel  obstruction.  Heart rate likely in the setting of physiologic stress and possibly absorption of p.o. medications in the setting.  She remains on diltiazem drip at 5  mg an hour.  She is on soft  currently.  No further vomiting or abdominal pain.    Blood pressure is normal.  View of home medications has her on diltiazem 60 mg every 6 hours, will try p.o. diltiazem today and wean off drip to evaluate her response to p.o. meds.  If heart rate remains elevated on p.o. meds, may need to keep her on IV guilt and to help better absorption is obtained.  He continues on apixaban 2.5 mg twice daily.  No blleeding.  Hemoglobin is 10.3, hematocrit 36.0.  2.  C. difficile: Continues IV metronidazole.  Some dehydration is noted and she is receiving IV fluids.  Creatinine 1.60.  She denies further abdominal pain or diarrhea.  3.  Small bowel obstruction: Current.  Had small bowel obstruction earlier this year and was hospitalized.  Been followed by Dr. Arnoldo Morale who prefers not to have surgical intervention as she is high risk.  Currently on soft diet.   For questions or updates, please contact [place] Please consult [email or web address] for contact info under Cardiology/STEMI.   Signed, Phill Myron. West Pugh, ANP, AACC  09/15/2017 1:50 PM   The patient was seen and examined, and I agree with the history, physical exam, assessment and plan as documented above, with modifications as noted below. I have also personally reviewed all relevant documentation, old records, labs, and both radiographic and cardiovascular studies. I have also independently interpreted old and new ECG's.  83 year old woman with a past medical history significant for hypertension, paroxysmal atrial fibrillation, paroxysmal atrial tachycardia, and SVT who has been maintained on oral diltiazem for arrhythmic suppression. She also has a history of bronchiectasis, COPD, and chronic respiratory failure for which he uses oxygen at night. She  was recently hospitalized in late November and early December for acute on chronic diastolic heart failure and Clostridium difficile colitis treated with metronidazole.  She underwent a left-sided thoracentesis at that time. She subsequently developed diarrhea and was found to be C. difficile positive again.  She was restarted on metronidazole but reportedly has not been taking it consistently. Her daughter says she has not been taking Lasix consistently either as she feared it would worsen her renal function. At the time of this admission, CT of the abdomen and pelvis revealed moderate bilateral pleural effusions with left greater than right and a dilated fluid-filled small bowel loop measuring up to 5.3 cm concerning for small bowel obstruction. General surgery evaluated her and felt there is no indication for surgical intervention. She was started on a diltiazem infusion for heart rate control.  She was offered Lovenox but refused and has been given low-dose Eliquis for anticoagulation. She just received her first dose of oral diltiazem at 10 AM and remains on concomitant IV diltiazem at 5 mg/h. The patient has some palpitations and denies chest pain. Lasix had been IV 40 mg twice daily but this has been reduced to daily dosing due to concerns for worsening renal function. Her daughter tells me that her legs are much less swollen.  I doubt the accuracy of I/Os. She did have 1.3 L removed via left-sided thoracentesis on 09/15/17. Chest x-ray postthoracentesis showed persistent right pulmonary edema and right pleural effusion with basilar atelectasis and biapical scarring.  She just ate some food and is tolerating this.  I spoke to the patient's nurse about providing another dose of oral diltiazem 3 hours after her initial dose and to continue low-dose IV diltiazem drip until he can be successfully  weaned off.  Heart rate is currently in the 140 bpm range. I would continue IV Lasix 40 mg daily for now  with close monitoring of renal function.  I suspect as her gastrointestinal infection continues to improve, her heart rate will be easier to control.  Kate Sable, MD, [place]  09/16/2017 10:52 AM

## 2017-09-15 NOTE — Procedures (Signed)
PreOperative Dx: LEFT pleural effusion, CHF Postoperative Dx: LEFT pleural effusion, CHF Procedure:   US guided LEFT thoracentesis Radiologist:  Thornton Papas Anesthesia:  10 ml of 1% lidocaine Specimen:  1.3 L of amber colored fluid EBL:   < 1 ml Complications: None

## 2017-09-15 NOTE — Progress Notes (Signed)
RN noted that pt had not voided this shift. Pt asked to void and she stated that she did not have to. Bladder scan revealed 957 mL urine. Pt encouraged to void again. Voided an unmeasured amount as she immediately began to void in the bed. Bladder scan performed afterward and there was less than 200 mL in the bladder. A purwick was applied and pt was encouraged to void when she felt she needed to. Will continue to monitor.

## 2017-09-16 ENCOUNTER — Inpatient Hospital Stay (HOSPITAL_COMMUNITY): Payer: Medicare Other

## 2017-09-16 DIAGNOSIS — Z9889 Other specified postprocedural states: Secondary | ICD-10-CM

## 2017-09-16 DIAGNOSIS — J90 Pleural effusion, not elsewhere classified: Secondary | ICD-10-CM

## 2017-09-16 DIAGNOSIS — J470 Bronchiectasis with acute lower respiratory infection: Secondary | ICD-10-CM

## 2017-09-16 LAB — BASIC METABOLIC PANEL
Anion gap: 10 (ref 5–15)
BUN: 43 mg/dL — AB (ref 6–20)
CO2: 30 mmol/L (ref 22–32)
CREATININE: 1.6 mg/dL — AB (ref 0.44–1.00)
Calcium: 8.2 mg/dL — ABNORMAL LOW (ref 8.9–10.3)
Chloride: 99 mmol/L — ABNORMAL LOW (ref 101–111)
GFR calc Af Amer: 33 mL/min — ABNORMAL LOW (ref >60)
GFR, EST NON AFRICAN AMERICAN: 28 mL/min — AB (ref >60)
Glucose, Bld: 147 mg/dL — ABNORMAL HIGH (ref 65–99)
Potassium: 4 mmol/L (ref 3.5–5.1)
SODIUM: 139 mmol/L (ref 135–145)

## 2017-09-16 LAB — PROTEIN, PLEURAL OR PERITONEAL FLUID: Total protein, fluid: <3 g/dL

## 2017-09-16 LAB — CBC
HCT: 36 % (ref 36.0–46.0)
Hemoglobin: 10.3 g/dL — ABNORMAL LOW (ref 12.0–15.0)
MCH: 27.8 pg (ref 26.0–34.0)
MCHC: 28.6 g/dL — ABNORMAL LOW (ref 30.0–36.0)
MCV: 97.3 fL (ref 78.0–100.0)
PLATELETS: 274 10*3/uL (ref 150–400)
RBC: 3.7 MIL/uL — ABNORMAL LOW (ref 3.87–5.11)
RDW: 14.4 % (ref 11.5–15.5)
WBC: 11.9 10*3/uL — AB (ref 4.0–10.5)

## 2017-09-16 LAB — LACTATE DEHYDROGENASE, PLEURAL OR PERITONEAL FLUID: LD, Fluid: 62 U/L — ABNORMAL HIGH (ref 3–23)

## 2017-09-16 LAB — HEPATIC FUNCTION PANEL
ALK PHOS: 59 U/L (ref 38–126)
ALT: 14 U/L (ref 14–54)
AST: 18 U/L (ref 15–41)
Albumin: 2.5 g/dL — ABNORMAL LOW (ref 3.5–5.0)
BILIRUBIN TOTAL: 0.4 mg/dL (ref 0.3–1.2)
Bilirubin, Direct: 0.1 mg/dL (ref 0.1–0.5)
Indirect Bilirubin: 0.3 mg/dL (ref 0.3–0.9)
TOTAL PROTEIN: 6.4 g/dL — AB (ref 6.5–8.1)

## 2017-09-16 LAB — LACTATE DEHYDROGENASE: LDH: 121 U/L (ref 98–192)

## 2017-09-16 MED ORDER — IPRATROPIUM BROMIDE 0.02 % IN SOLN
0.5000 mg | Freq: Three times a day (TID) | RESPIRATORY_TRACT | Status: DC
  Administered 2017-09-16: 0.5 mg via RESPIRATORY_TRACT

## 2017-09-16 MED ORDER — IPRATROPIUM BROMIDE 0.02 % IN SOLN
RESPIRATORY_TRACT | Status: AC
  Filled 2017-09-16: qty 2.5

## 2017-09-16 MED ORDER — LEVALBUTEROL HCL 0.63 MG/3ML IN NEBU
INHALATION_SOLUTION | RESPIRATORY_TRACT | Status: AC
  Filled 2017-09-16: qty 3

## 2017-09-16 MED ORDER — LEVALBUTEROL HCL 0.63 MG/3ML IN NEBU
0.6300 mg | INHALATION_SOLUTION | Freq: Three times a day (TID) | RESPIRATORY_TRACT | Status: DC
  Administered 2017-09-16: 0.63 mg via RESPIRATORY_TRACT

## 2017-09-16 MED ORDER — IPRATROPIUM BROMIDE 0.02 % IN SOLN
0.5000 mg | Freq: Three times a day (TID) | RESPIRATORY_TRACT | Status: DC
  Administered 2017-09-16 – 2017-09-17 (×4): 0.5 mg via RESPIRATORY_TRACT
  Filled 2017-09-16 (×4): qty 2.5

## 2017-09-16 MED ORDER — LEVALBUTEROL HCL 0.63 MG/3ML IN NEBU
0.6300 mg | INHALATION_SOLUTION | RESPIRATORY_TRACT | Status: DC
  Administered 2017-09-16 – 2017-09-17 (×4): 0.63 mg via RESPIRATORY_TRACT
  Filled 2017-09-16 (×4): qty 3

## 2017-09-16 MED ORDER — DILTIAZEM HCL 60 MG PO TABS
60.0000 mg | ORAL_TABLET | Freq: Four times a day (QID) | ORAL | Status: DC
  Administered 2017-09-16 – 2017-09-21 (×22): 60 mg via ORAL
  Filled 2017-09-16 (×22): qty 1

## 2017-09-16 MED ORDER — VANCOMYCIN 50 MG/ML ORAL SOLUTION
500.0000 mg | Freq: Four times a day (QID) | ORAL | Status: DC
  Administered 2017-09-16 – 2017-09-18 (×10): 500 mg via ORAL
  Filled 2017-09-16 (×13): qty 10

## 2017-09-16 NOTE — Progress Notes (Signed)
Subjective: Patient resting comfortably.  No abdominal pain noted.  Objective: Vital signs in last 24 hours: Temp:  [97.1 F (36.2 C)-98.4 F (36.9 C)] 97.5 F (36.4 C) (12/21 0746) Pulse Rate:  [41-156] 111 (12/21 0746) Resp:  [13-40] 22 (12/21 0746) BP: (94-132)/(44-91) 111/61 (12/21 0730) SpO2:  [86 %-100 %] 100 % (12/21 0746) Weight:  [119 lb 14.9 oz (54.4 kg)] 119 lb 14.9 oz (54.4 kg) (12/21 0545) Last BM Date: 09/15/17  Intake/Output from previous day: 12/20 0701 - 12/21 0700 In: 719 [P.O.:250; I.V.:169; IV Piggyback:300] Out: 100 [Stool:100] Intake/Output this shift: No intake/output data recorded.  General appearance: alert, cooperative and no distress GI: Soft, minimally distended.  No rigidity noted.  No tenderness noted.  Bowel sounds present.  Lab Results:  Recent Labs    09/15/17 0407 09/16/17 0359  WBC 13.9* 11.9*  HGB 10.6* 10.3*  HCT 37.4 36.0  PLT 359 274   BMET Recent Labs    09/15/17 0407 09/16/17 0359  NA 140 139  K 4.1 4.0  CL 99* 99*  CO2 30 30  GLUCOSE 111* 147*  BUN 41* 43*  CREATININE 1.80* 1.60*  CALCIUM 8.1* 8.2*   PT/INR No results for input(s): LABPROT, INR in the last 72 hours.  Studies/Results: Dg Chest Port 1 View  Result Date: 09/15/2017 CLINICAL DATA:  Small bowel obstruction EXAM: PORTABLE CHEST 1 VIEW COMPARISON:  Portable exam 1324 hours compared to 09/13/2017 FINDINGS: Enlargement of cardiac silhouette with pulmonary vascular congestion. Diffuse pulmonary edema consistent with CHF. Associated bibasilar pleural effusions and atelectasis slightly greater on LEFT, appear progressive since prior study. No pneumothorax. Bones demineralized IMPRESSION: CHF with diffuse pulmonary edema, bibasilar effusions and atelectasis. BILATERAL pleural effusions and basilar atelectasis appear mildly progressive since previous exam Electronically Signed   By: Lavonia Dana M.D.   On: 09/15/2017 14:54   Dg Chest Port 1v Same Day  Result  Date: 09/15/2017 CLINICAL DATA:  CHF, BILATERAL pleural effusions post LEFT thoracentesis EXAM: PORTABLE CHEST 1 VIEW COMPARISON:  Portable exam 1615 hours compared to earlier study of at 1324 hours FINDINGS: Enlargement of cardiac silhouette with pulmonary vascular congestion. Slightly decreased pulmonary edema in LEFT lung. Decreased LEFT pleural effusion and basilar atelectasis post thoracentesis. No pneumothorax. Persistent RIGHT pulmonary edema, RIGHT pleural effusion and basilar atelectasis. Biapical scarring. Bones demineralized. IMPRESSION: No pneumothorax following LEFT thoracentesis. Electronically Signed   By: Lavonia Dana M.D.   On: 09/15/2017 16:40   Dg Abd 2 Views  Result Date: 09/15/2017 CLINICAL DATA:  Bowel obstruction EXAM: ABDOMEN - 2 VIEW COMPARISON:  September 14, 2017 FINDINGS: Supine and upright images were obtained. There are loops of mildly dilated small bowel in the lower abdomen and pelvis, similar to 1 day prior. There are foci of contrast in the colon. There are scattered colonic diverticula. No evident free air. There is contrast in the urinary bladder. There is lumbar scoliosis. IMPRESSION: Contrast is seen in the colon. There are scattered colonic diverticula. Mild dilatation of several small bowel loops, similar to 1 day prior. No air-fluid levels appreciable. No free air. Electronically Signed   By: Lowella Grip III M.D.   On: 09/15/2017 22:59   US Thoracentesis Asp Pleural Space W/img Guide  Result Date: 09/15/2017 INDICATION: CHF, BILATERAL pleural effusions EXAM: ULTRASOUND GUIDED DIAGNOSTIC AND THERAPEUTIC LEFT THORACENTESIS MEDICATIONS: None. COMPLICATIONS: None immediate. PROCEDURE: Procedure, benefits, and risks of procedure were discussed with patient. Written informed consent for procedure was obtained. Time out protocol followed. Pleural effusion  localized by ultrasound at the posterior LEFT hemithorax. Skin prepped and draped in usual sterile fashion. Skin  and soft tissues anesthetized with 10 mL of 1% lidocaine. 8 French thoracentesis catheter placed into the LEFT pleural space. 1.3 L of amber colored LEFT pleural fluid was aspirated by syringe pump. Procedure tolerated well by patient without immediate complication. FINDINGS: A total of approximately 1.3 L of LEFT pleural fluid was removed. Samples were sent to the laboratory as requested by the clinical team. IMPRESSION: Successful ultrasound guided LEFT thoracentesis yielding 1.3 of pleural fluid. Electronically Signed   By: Lavonia Dana M.D.   On: 09/15/2017 16:34    Anti-infectives: Anti-infectives (From admission, onward)   Start     Dose/Rate Route Frequency Ordered Stop   09/14/17 1200  vancomycin (VANCOCIN) 500 mg in sodium chloride irrigation 0.9 % 100 mL ENEMA     500 mg Rectal Every 6 hours 09/14/17 1040     09/14/17 0100  metroNIDAZOLE (FLAGYL) IVPB 500 mg     500 mg 100 mL/hr over 60 Minutes Intravenous Every 8 hours 09/14/17 0029     09/13/17 1830  cefTRIAXone (ROCEPHIN) 1 g in dextrose 5 % 50 mL IVPB     1 g 100 mL/hr over 30 Minutes Intravenous  Once 09/13/17 1818 09/13/17 1922      Assessment/Plan: Impression: Small bowel obstruction, resolved.  Continues treatment for C. difficile colitis. Plan: Will advance to soft diet.  Will follow peripherally with you.  No need for acute surgical intervention at this time.  LOS: 3 days    Aviva Signs 09/16/2017

## 2017-09-16 NOTE — Consult Note (Signed)
The patient was seen and examined, and I agree with the history, physical exam, assessment and plan as documented by K. Lawrence DNP, with modifications as noted below. I have also personally reviewed all relevant documentation, old records, labs, and both radiographic and cardiovascular studies. I have also independently interpreted old and new ECG's.  83 year old woman with a past medical history significant for hypertension, paroxysmal atrial fibrillation, paroxysmal atrial tachycardia, and SVT who has been maintained on oral diltiazem for arrhythmic suppression. She also has a history of bronchiectasis, COPD, and chronic respiratory failure for which he uses oxygen at night. She was recently hospitalized in late November and early December for acute on chronic diastolic heart failure and Clostridium difficile colitis treated with metronidazole.  She underwent a left-sided thoracentesis at that time. She subsequently developed diarrhea and was found to be C. difficile positive again.  She was restarted on metronidazole but reportedly has not been taking it consistently. Her daughter says she has not been taking Lasix consistently either as she feared it would worsen her renal function. At the time of this admission, CT of the abdomen and pelvis revealed moderate bilateral pleural effusions with left greater than right and a dilated fluid-filled small bowel loop measuring up to 5.3 cm concerning for small bowel obstruction. General surgery evaluated her and felt there is no indication for surgical intervention. She was started on a diltiazem infusion for heart rate control.  She was offered Lovenox but refused and has been given low-dose Eliquis for anticoagulation. She just received her first dose of oral diltiazem at 10 AM and remains on concomitant IV diltiazem at 5 mg/h. The patient has some palpitations and denies chest pain. Lasix had been IV 40 mg twice daily but this has been reduced to daily  dosing due to concerns for worsening renal function. Her daughter tells me that her legs are much less swollen.  I doubt the accuracy of I/Os. She did have 1.3 L removed via left-sided thoracentesis on 09/15/17. Chest x-ray postthoracentesis showed persistent right pulmonary edema and right pleural effusion with basilar atelectasis and biapical scarring.  Echocardiogram 08/26/17: Normal left ventricular systolic function and regional wall motion, LVEF 82-64%, grade 2 diastolic dysfunction with elevated left ventricular filling pressures, mild aortic and mitral regurgitation, moderate tricuspid regurgitation with severely elevated pulmonary pressures, 61 mmHg.  There is also a small pericardial effusion.  She just ate some food and is tolerating this.  I spoke to the patient's nurse about providing another dose of oral diltiazem 3 hours after her initial dose and to continue low-dose IV diltiazem drip until he can be successfully weaned off.  Heart rate is currently in the 140 bpm range. I would continue IV Lasix 40 mg daily for now with close monitoring of renal function.  I suspect as her gastrointestinal infection continues to improve, her heart rate will be easier to control.    Kate Sable, MD, [facility]  09/16/2017 11:00 AM

## 2017-09-16 NOTE — Consult Note (Signed)
Consult requested by: [facility] hospitalist, Dr.Tat Consult requested for: Acute on chronic respiratory failure, abnormal chest x-ray, bronchiectasis, COPD and pleural effusions  HPI: This is an 83 year old with multiple medical problems including COPD, bronchiectasis on chest CT, chronic hypoxic respiratory failure, chronic diastolic heart failure, paroxysmal atrial fib, recurrent pleural effusions, Clostridium difficile colitis possible small bowel obstruction.  She was admitted to the hospital on 1218 with increased shortness of breath.  As part of her evaluation in the emergency department she had CT of the abdomen that showed partial small bowel obstruction involving the ileum and bilateral pleural effusions.  She has had thoracentesis during this hospitalization with removal of about 1.3 L of fluid and she says that helped.  Her only complaint this morning is that she is cold.  She says that she does have shortness of breath and uses oxygen at home.  She denies chest pain.  She says she is not coughing very much.  She is not having any abdominal pain.  She did have abdominal pain on admission.  She has had some trouble with urinary retention.  She has had atrial fib with rapid ventricular response during this hospitalization also.  Over the last 24-48 hours she has had increasing oxygen requirement.  Past Medical History:  Diagnosis Date  . Aortic regurgitation   . Bronchiectasis ([place])   . COPD (chronic obstructive pulmonary disease) ([place])   . DJD (degenerative joint disease)   . HLD (hyperlipidemia)   . HTN (hypertension)   . Hypothyroidism   . Palpitations    a. has known history of PAC's and PVC's. b. 09/2015: monitor showed sinus rhythm with occasional PAC's and a brief episode of PAT.     Family History  Problem Relation Age of Onset  . Emphysema Mother   . Diabetes Mother   . Cancer Father   . Emphysema Sister   . Emphysema Sister   . Rheumatic fever Sister   . Heart attack Sister    . Diabetes Sister   . Diabetes Brother   . Asthma Brother        as a child     Social History   Socioeconomic History  . Marital status: Married    Spouse name: None  . Number of children: 5  . Years of education: None  . Highest education level: None  Social Needs  . Financial resource strain: None  . Food insecurity - worry: None  . Food insecurity - inability: None  . Transportation needs - medical: None  . Transportation needs - non-medical: None  Occupational History  . Occupation: retired  Tobacco Use  . Smoking status: Former Smoker    Packs/day: 1.00    Years: 20.00    Pack years: 20.00    Types: Cigarettes    Last attempt to quit: 09/27/1974    Years since quitting: 43.0  . Smokeless tobacco: Never Used  Substance and Sexual Activity  . Alcohol use: No  . Drug use: No  . Sexual activity: No  Other Topics Concern  . None  Social History Narrative  . None     ROS: Except as mentioned 10 point review of systems is negative    Objective: Vital signs in last 24 hours: Temp:  [97.1 F (36.2 C)-97.9 F (36.6 C)] 97.5 F (36.4 C) (12/21 0746) Pulse Rate:  [41-156] 111 (12/21 0746) Resp:  [13-40] 22 (12/21 0746) BP: (94-132)/(44-80) 111/61 (12/21 0730) SpO2:  [89 %-100 %] 100 % (12/21 0746) Weight:  [  54.4 kg (119 lb 14.9 oz)] 54.4 kg (119 lb 14.9 oz) (12/21 0545) Weight change:  Last BM Date: 09/15/17  Intake/Output from previous day: 12/20 0701 - 12/21 0700 In: 719 [P.O.:250; I.V.:169; IV Piggyback:300] Out: 100 [Stool:100]  PHYSICAL EXAM Constitutional: She looks chronically sick but in no acute distress.  Eyes: Pupils react EOMI.  Ears nose mouth and throat: Her throat is clear.  Hearing is grossly normal.  Respiratory: She has some rhonchi and wheezing bilaterally but is not in any acute respiratory distress now.  Cardiovascular: Her heart is irregular.  She has what seems to be both diastolic and systolic heart murmurs.  Gastrointestinal: Her  abdomen is still somewhat protuberant minimally tender skin: Her right leg is in a dressing apparently from a previous skin wound.  Musculoskeletal: She is able to move all 4 extremities she seems generally weak.  Neurological: No focal abnormalities.  Psychiatric: Mildly anxious  Lab Results: Basic Metabolic Panel: Recent Labs    09/15/17 0407 09/16/17 0359  NA 140 139  K 4.1 4.0  CL 99* 99*  CO2 30 30  GLUCOSE 111* 147*  BUN 41* 43*  CREATININE 1.80* 1.60*  CALCIUM 8.1* 8.2*  MG 1.8  --    Liver Function Tests: Recent Labs    09/14/17 0404 09/16/17 0359  AST 21 18  ALT 15 14  ALKPHOS 54 59  BILITOT 0.6 0.4  PROT 6.4* 6.4*  ALBUMIN 2.5* 2.5*   No results for input(s): LIPASE, AMYLASE in the last 72 hours. No results for input(s): AMMONIA in the last 72 hours. CBC: Recent Labs    09/13/17 1638  09/15/17 0407 09/16/17 0359  WBC 17.1*   < > 13.9* 11.9*  NEUTROABS 14.4*  --   --   --   HGB 11.8*   < > 10.6* 10.3*  HCT 40.6   < > 37.4 36.0  MCV 94.9   < > 99.5 97.3  PLT 385   < > 359 274   < > = values in this interval not displayed.   Cardiac Enzymes: Recent Labs    09/13/17 1638  TROPONINI <0.03   BNP: No results for input(s): PROBNP in the last 72 hours. D-Dimer: No results for input(s): DDIMER in the last 72 hours. CBG: No results for input(s): GLUCAP in the last 72 hours. Hemoglobin A1C: No results for input(s): HGBA1C in the last 72 hours. Fasting Lipid Panel: No results for input(s): CHOL, HDL, LDLCALC, TRIG, CHOLHDL, LDLDIRECT in the last 72 hours. Thyroid Function Tests: Recent Labs    09/15/17 0407  TSH 4.511*   Anemia Panel: No results for input(s): VITAMINB12, FOLATE, FERRITIN, TIBC, IRON, RETICCTPCT in the last 72 hours. Coagulation: No results for input(s): LABPROT, INR in the last 72 hours. Urine Drug Screen: Drugs of Abuse  No results found for: LABOPIA, COCAINSCRNUR, LABBENZ, AMPHETMU, THCU, LABBARB  Alcohol Level: No results  for input(s): ETH in the last 72 hours. Urinalysis: Recent Labs    09/13/17 1647  COLORURINE YELLOW  LABSPEC 1.010  PHURINE 6.0  GLUCOSEU NEGATIVE  HGBUR NEGATIVE  BILIRUBINUR NEGATIVE  KETONESUR NEGATIVE  PROTEINUR 30*  NITRITE NEGATIVE  LEUKOCYTESUR TRACE*   Misc. Labs:   ABGS: No results for input(s): PHART, PO2ART, TCO2, HCO3 in the last 72 hours.  Invalid input(s): PCO2   MICROBIOLOGY: Recent Results (from the past 240 hour(s))  Blood culture (routine x 2)     Status: None (Preliminary result)   Collection Time: 09/13/17  4:38  PM  Result Value Ref Range Status   Specimen Description BLOOD RIGHT FOREARM  Final   Special Requests   Final    BOTTLES DRAWN AEROBIC AND ANAEROBIC Blood Culture adequate volume   Culture NO GROWTH 3 DAYS  Final   Report Status PENDING  Incomplete  Blood culture (routine x 2)     Status: None (Preliminary result)   Collection Time: 09/13/17  4:50 PM  Result Value Ref Range Status   Specimen Description LEFT ANTECUBITAL  Final   Special Requests   Final    BOTTLES DRAWN AEROBIC AND ANAEROBIC Blood Culture adequate volume   Culture NO GROWTH 3 DAYS  Final   Report Status PENDING  Incomplete  MRSA PCR Screening     Status: None   Collection Time: 09/14/17  2:20 PM  Result Value Ref Range Status   MRSA by PCR NEGATIVE NEGATIVE Final    Comment:        The GeneXpert MRSA Assay (FDA approved for NASAL specimens only), is one component of a comprehensive MRSA colonization surveillance program. It is not intended to diagnose MRSA infection nor to guide or monitor treatment for MRSA infections.   Gram stain     Status: None   Collection Time: 09/15/17  3:30 PM  Result Value Ref Range Status   Specimen Description PLEURAL  Final   Special Requests NONE  Final   Gram Stain   Final    Performed at [place] PMN AND MONONUCLEAR NO ORGANISMS SEEN    Report Status 09/15/2017 FINAL  Final   Culture, body fluid-bottle     Status: None (Preliminary result)   Collection Time: 09/15/17  4:00 PM  Result Value Ref Range Status   Specimen Description ASCITIC  Final   Special Requests   Final    BOTTLES DRAWN AEROBIC AND ANAEROBIC Blood Culture adequate volume   Culture NO GROWTH < 24 HOURS  Final   Report Status PENDING  Incomplete    Studies/Results: Dg Chest Port 1 View  Result Date: 09/15/2017 CLINICAL DATA:  Small bowel obstruction EXAM: PORTABLE CHEST 1 VIEW COMPARISON:  Portable exam 1324 hours compared to 09/13/2017 FINDINGS: Enlargement of cardiac silhouette with pulmonary vascular congestion. Diffuse pulmonary edema consistent with CHF. Associated bibasilar pleural effusions and atelectasis slightly greater on LEFT, appear progressive since prior study. No pneumothorax. Bones demineralized IMPRESSION: CHF with diffuse pulmonary edema, bibasilar effusions and atelectasis. BILATERAL pleural effusions and basilar atelectasis appear mildly progressive since previous exam Electronically Signed   By: Lavonia Dana M.D.   On: 09/15/2017 14:54   Dg Chest Port 1v Same Day  Result Date: 09/15/2017 CLINICAL DATA:  CHF, BILATERAL pleural effusions post LEFT thoracentesis EXAM: PORTABLE CHEST 1 VIEW COMPARISON:  Portable exam 1615 hours compared to earlier study of at 1324 hours FINDINGS: Enlargement of cardiac silhouette with pulmonary vascular congestion. Slightly decreased pulmonary edema in LEFT lung. Decreased LEFT pleural effusion and basilar atelectasis post thoracentesis. No pneumothorax. Persistent RIGHT pulmonary edema, RIGHT pleural effusion and basilar atelectasis. Biapical scarring. Bones demineralized. IMPRESSION: No pneumothorax following LEFT thoracentesis. Electronically Signed   By: Lavonia Dana M.D.   On: 09/15/2017 16:40   Dg Abd 2 Views  Result Date: 09/15/2017 CLINICAL DATA:  Bowel obstruction EXAM: ABDOMEN - 2 VIEW COMPARISON:  September 14, 2017 FINDINGS: Supine and  upright images were obtained. There are loops of mildly dilated small bowel in the lower abdomen and pelvis, similar to 1 day  prior. There are foci of contrast in the colon. There are scattered colonic diverticula. No evident free air. There is contrast in the urinary bladder. There is lumbar scoliosis. IMPRESSION: Contrast is seen in the colon. There are scattered colonic diverticula. Mild dilatation of several small bowel loops, similar to 1 day prior. No air-fluid levels appreciable. No free air. Electronically Signed   By: Lowella Grip III M.D.   On: 09/15/2017 22:59   US Thoracentesis Asp Pleural Space W/img Guide  Result Date: 09/15/2017 INDICATION: CHF, BILATERAL pleural effusions EXAM: ULTRASOUND GUIDED DIAGNOSTIC AND THERAPEUTIC LEFT THORACENTESIS MEDICATIONS: None. COMPLICATIONS: None immediate. PROCEDURE: Procedure, benefits, and risks of procedure were discussed with patient. Written informed consent for procedure was obtained. Time out protocol followed. Pleural effusion localized by ultrasound at the posterior LEFT hemithorax. Skin prepped and draped in usual sterile fashion. Skin and soft tissues anesthetized with 10 mL of 1% lidocaine. 8 French thoracentesis catheter placed into the LEFT pleural space. 1.3 L of amber colored LEFT pleural fluid was aspirated by syringe pump. Procedure tolerated well by patient without immediate complication. FINDINGS: A total of approximately 1.3 L of LEFT pleural fluid was removed. Samples were sent to the laboratory as requested by the clinical team. IMPRESSION: Successful ultrasound guided LEFT thoracentesis yielding 1.3 of pleural fluid. Electronically Signed   By: Lavonia Dana M.D.   On: 09/15/2017 16:34    Medications:  Prior to Admission:  Medications Prior to Admission  Medication Sig Dispense Refill Last Dose  . ALPRAZolam (XANAX) 0.25 MG tablet Take 0.125-0.25 mg by mouth at bedtime.    09/12/2017 at Unknown time  . apixaban (ELIQUIS) 2.5 MG  TABS tablet Take 1 tablet (2.5 mg total) by mouth 2 (two) times daily. 180 tablet 3 09/13/2017 at 1100a  . diltiazem (CARDIZEM) 60 MG tablet Take 60 mg by mouth 4 (four) times daily.    09/13/2017 at Unknown time  . furosemide (LASIX) 20 MG tablet Take 1 tablet (20 mg total) by mouth daily. 30 tablet 0 09/13/2017 at Unknown time  . levothyroxine (SYNTHROID, LEVOTHROID) 50 MCG tablet TAKE ONE TABLET BY MOUTH ONCE A DAY AS DIRECTED 90 tablet 1 09/13/2017 at Unknown time  . Metoprolol Tartrate 37.5 MG TABS Take 37.5 mg by mouth 2 (two) times daily. 180 tablet 3 09/13/2017 at 1100a  . Probiotic Product (PROBIOTIC-10) CHEW Chew 2 tablets by mouth daily.   Past Week at Unknown time  . metroNIDAZOLE (FLAGYL) 500 MG tablet Take 1 tablet 3 x / day after meals for infection (Patient not taking: Reported on 09/13/2017) 42 tablet 0 Not Taking at Unknown time  . silver sulfADIAZINE (SILVADENE) 1 % cream Apply to affected area daily (Patient not taking: Reported on 09/13/2017) 50 g 1 Completed Course at Unknown time  . traMADol (ULTRAM) 50 MG tablet Take 1 tablet (50 mg total) by mouth every 6 (six) hours as needed. 20 tablet 0 UNKNOWN   Scheduled: . apixaban  2.5 mg Oral BID  . diltiazem  60 mg Oral Q6H  . furosemide  40 mg Intravenous Daily  . hydrocerin   Topical BID  . ipratropium-albuterol  3 mL Nebulization BID  . levothyroxine  25 mcg Intravenous QAC breakfast  . mouth rinse  15 mL Mouth Rinse BID  . metoprolol tartrate  2.5 mg Intravenous Q6H  . pantoprazole (PROTONIX) IV  40 mg Intravenous Q24H  . vancomycin (VANCOCIN) rectal ENEMA  500 mg Rectal Q6H   Continuous: . metronidazole Stopped (09/16/17 8127)  [id number] **OR** acetaminophen, ALPRAZolam, LORazepam, ondansetron **OR** ondansetron (ZOFRAN) IV  Assesment: She has acute on chronic hypoxic respiratory failure with increasing oxygen requirement for the last 48 hours or so.  I think some of this was related to her pleural effusion  and has improved after thoracentesis with removal of 1.3 L of pleural fluid.  At baseline she has COPD and is on chronic oxygen at home.  She says she is short of breath with exertion and she "paces herself" at home.  Chest CT done in 2013 is Consistent with emphysema and bronchiectasis.  She has seen Dr. Keturah Barre in [place] in the past but it looks like the last note from him is from 2016.  She is currently on nebulizer treatments oxygen.  If she continues with increased oxygen requirements steroids could be added.  Considering her atrial fib with RVR I will switch her nebulizer treatment to Xopenex with ipratropium.  She is apparently not on any inhalers at home.  She has atrial fib with RVR cardiology.  She has severe protein calorie malnutrition and this is made worse by recurrent C. difficile colitis  She has recurrent C. difficile colitis on vancomycin enemas  She had partial small bowel obstruction on admission and that seems to have resolved  She has acute on chronic diastolic heart failure which is being treated with Lasix  She has bilateral pleural effusions and had thoracentesis on the left removing 1.3 L which seems to have helped Active Problems:   Acute on chronic respiratory failure with hypoxia ([facility])   AF (paroxysmal atrial fibrillation) ([facility])   Protein-calorie malnutrition, severe ([facility])   Acute on chronic diastolic CHF (congestive heart failure) ([facility])   SBO (small bowel obstruction) ([facility])   C. difficile colitis   Bronchiectasis ([place])   Congestive heart failure ([place])   Palliative care encounter   Goals of care, counseling/discussion   DNR (do not resuscitate) discussion    Plan: Continue current treatments.  Potentially add steroids but I would hold off on that for now because of concerns about side effects.  Switch her to Xopenex.  Continue other treatments.    LOS: 3 days   Marijane Trower L 09/16/2017, 8:34 AM

## 2017-09-16 NOTE — Procedures (Signed)
PreOperative Dx: RIGHT pleural effusion Postoperative Dx: RIGHT pleural effusion Procedure:   US guided RIGHT thoracentesis Radiologist:  Thornton Papas Anesthesia:  10 ml of 1% lidocaine Specimen:  1 L of amber colored fluid EBL:   < 1 ml Complications: None

## 2017-09-16 NOTE — Plan of Care (Addendum)
After receiving thoracentesis today, RT and RN were able to titrate oxygen down from 10 L HFNC to 2 L by this evening. She normally wears 2L at bedtime. Pt tolerating well with saturations in the high 90's. Still has a weak non-productive cough. Pt tolerating soft diet well. Has not produced a bowel movement yet. Flexiseal was removed today per verbal MD order as vancomycin route was changed from rectal to oral. Pt produced some urine today though it was unable to be measured since the [facility] device was not properly in place. The small amount of urine that was captured in the suction container was very dark amber. [place] has been removed and patient is encouraged to call for bedpan when she needs to void so that urine may be measured. Pt has a tendency to hold her urine for long periods of time and must be encouraged to void. Pt was started on oral diltiazem today with orders to discontinue gtt. After 2 doses of oral diltiazem and scheduled dose of metoprolol IVP, gtt was stopped. HR remains in 90's to 100's a-fib with SBP's in 90's to 100's at this time.

## 2017-09-16 NOTE — Progress Notes (Signed)
PROGRESS NOTE  Andrea [name] EMV:[id number] DOB: 1928-12-08 DOA: 09/13/2017 PCP: Unk Pinto, MD  Brief History: 83 year old female with a history of bronchiectasis/COPD, chronic respiratory failure on 2 L at night, hypertension, hyperlipidemia, diastolic CHF, hypothyroidism, and PAT/PAFpresented with 4-5-day history of diarrhea and 1 day history of nausea and vomiting. The patient was recently discharged from the hospital after a stay from August 26, 2017 through September 01, 2017. During that hospital stay, the patient was treated for acute on chronic diastolic CHF as well as finishing treatment for C. difficile colitis with metronidazole.The last day of metronidazole was August 30, 2017. During that hospitalization, the patient also underwent a left-sided thoracocentesis. Fluid studies revealed that it was transudate of. The patient was discharged home with furosemide 20 mg daily. Unfortunately, the patient began having diarrhea 4-5 days prior to this admission. The patient went to see her primary care provider and C. difficile assay was positive again. The patient was restarted on metronidazole. However,the patient states that she was not taking it consistently. In addition, the patient intermittently does not take her cardiac medications including but not limited to metoprolol, diltiazem, and furosemide. The patient complains of dyspnea on exertion, but states that this is not much worse than usual. In addition, the patient states that her lower extremity edema is about the same as usual. She denies any hematochezia, hematemesis, fevers, chills, chest pain, headache, neck pain. Upon presentation, CT of the abdomen and pelvis revealed moderate bilateral pleural effusions, left greater than right. There was also a dilated fluid-filled small bowel loops up to 5.3 cm with a transition point in the mid to distal ileum concerning for bowel obstruction. Chest x-ray revealed  increased interstitial prominence with bilateral pleural effusions.    Assessment/Plan: Acute on chronic diastolic CHF -August 26, 2017 echo EF 60-65%, grade 2 DD, moderate TR, PASP 61 -daily weights--question accuracy -accurate I/O's--not accurate -fluid restrict -ContinueIV Lasix--decreased to once daily on 12/20 due to worsen renal function -cardiology consult appreciated -repeat CXR 12/20 personally reviewed, slight worsen pleural effusions and pulm edema -12/20--Left Thoracocentesis--> 1.3L, transudate  Bilateral Pleural Effusions -transudate by fluid analysis -08/29/17--Left Thora--1L -09/15/17--Left Thora--1.3L -09/16/17--requesting R-Thora -I have discussed the risks, benefits, and alternatives.  The risks include but are not limited to pain, bleeding, pneumothorax, infection.  Patient expressed understanding and wants to proceed.  Small bowel obstruction -Likely secondary to C. difficile colitis the patient also had a small bowel obstruction July 2018 secondary to adhesions. -Appreciate surgical consultation-->nonoperative management for now -she is a poor surgical candidate -repeat abd xray--personally reviewed--No Air Fluid Levels, minimal SB dilatation -change vancomycin to po  Recurrent C. difficile colitis -Continue vancomycin enemas>>>po vanco on 12/21 -Continue metronidazole IV -WBC overall trending down  Atrial fibrillation with RVR/Atrial tachycardia -Increase Lopressor IV to every 6 hours -Continue Cardizem drip>>>po dilt on 12/21 -Check TSH--4.511 -pt refuses [place] lovenox--wants Eliquis -I have explained to patient x 2 regarding SBO and questionable absorption of po meds and now with renal failure that apixaban is not a good option presently--she continues to refuse enoxaparin -restart apixaban with improving SB/GI function  Acute on chronic respiratory failure with hypoxia -Secondary to CHFin the setting of bronchiectasis -09/14/17  evening-->increased oxygen demand to 12 L HFNC -12/21--still requiring HFNC -Normally on 2 L nasal cannula at home -Wean oxygen back to 2 L for oxygen saturation greater than 92%  Acute on chronic renal failure--CKD3 -baseline creatinine 0.8-1.1 -12/20--Lasix IV decreased  to once daily -defer further adjustment to cardiology  COPD/bronchiectasis/chronic respiratory failure with hypoxia -On 2 L nasal cannula atnighttime at home -continue duonebs -pulmonary consult appreciated  Essential hypertension -Holdingoral metoprolol and diltiazeminitially due to SBO -hydralazine IV prn SBP >180  Anxiety -IV ativan q 8 hrs prn until able to take po xanax  Severe protein calorie malnutrition -Consult nutrition  Goals of Care -family has unrealistic expectations--becomes angry and wants to dismiss physicians/care team members when given any type of non-positive clinical information of any kind -they have dismissed the palliative medicine consultant -remains FULL CODE   Disposition Plan: remain in SDU  Family Communication:daughter updated at bedside 12/21--Total time spent 35 minutes.  Greater than 50% spent face to face counseling and coordinating care.   Consultants:General surgery; palliative med, cardiology  Code Status: FULL   DVT Prophylaxis: [place] Lovenox   Procedures: As Listed in Progress Note Above  Antibiotics: Vanco po/Enema 12/19>>>     Subjective: Patient is breathing only slightly better.  She remains short of breath with minimal exertion.  She denies any headache, chest pain, nausea, vomiting, abdominal pain.  She has had flatus.  Difficult to tell whether she has had a bowel movement secondary to vancomycin enemas.  Objective: Vitals:   09/16/17 0700 09/16/17 0715 09/16/17 0730 09/16/17 0746  BP: 104/61 (!) 106/55 111/61   Pulse: 91 80 (!) 113 (!) 111  Resp: 15 18 (!) 24 (!) 22  Temp:    (!) 97.5 F (36.4 C)  TempSrc:    Oral    SpO2: 100% 96% 99% 100%  Weight:      Height:        Intake/Output Summary (Last 24 hours) at 09/16/2017 0845 Last data filed at 09/16/2017 0700 Gross per 24 hour  Intake 582.75 ml  Output 100 ml  Net 482.75 ml   Weight change:  Exam:   General:  Pt is alert, follows commands appropriately, not in acute distress  HEENT: No icterus, No thrush, No neck mass, [place]/AT  Cardiovascular: IRRR, S1/S2, no rubs  Respiratory: Bibasilar rales.  No wheezing.  Abdomen: Soft/+BS, non tender, non distended, no guarding  Extremities: 1 + LE edema, No lymphangitis, No petechiae, No rashes, no synovitis   Data Reviewed: I have personally reviewed following labs and imaging studies Basic Metabolic Panel: Recent Labs  Lab 09/13/17 1638 09/14/17 0404 09/15/17 0407 09/16/17 0359  NA 141 142 140 139  K 4.1 4.3 4.1 4.0  CL 96* 100* 99* 99*  CO2 34* _0 GLUCOSE 121* 117* 111* 147*  BUN 27* 29* 41* 43*  CREATININE 1.10* 1.17* 1.80* 1.60*  CALCIUM 9.2 8.1* 8.1* 8.2*  MG  --   --  1.8  --    Liver Function Tests: Recent Labs  Lab 09/13/17 1638 09/14/17 0404 09/16/17 0359  AST _1 ALT _2 ALKPHOS 70 54 59  BILITOT 0.8 0.6 0.4  PROT 7.8 6.4* 6.4*  ALBUMIN 3.2* 2.5* 2.5*   No results for input(s): LIPASE, AMYLASE in the last 168 hours. No results for input(s): AMMONIA in the last 168 hours. Coagulation Profile: No results for input(s): INR, PROTIME in the last 168 hours. CBC: Recent Labs  Lab 09/13/17 1638 09/14/17 0404 09/15/17 0407 09/16/17 0359  WBC 17.1* 13.1* 13.9* 11.9*  NEUTROABS 14.4*  --   --   --   HGB 11.8* 10.4* 10.6* 10.3*  HCT 40.6 36.1 37.4 36.0  MCV 94.9 97.6 99.5  97.3  PLT 385 352 359 274   Cardiac Enzymes: Recent Labs  Lab 09/13/17 1638  TROPONINI <0.03   BNP: Invalid input(s): POCBNP CBG: No results for input(s): GLUCAP in the last 168 hours. HbA1C: No results for input(s): HGBA1C in the last 72 hours. Urine analysis:     Component Value Date/Time   COLORURINE YELLOW 09/13/2017 [place] 09/13/2017 1647   LABSPEC 1.010 09/13/2017 1647   PHURINE 6.0 09/13/2017 1647   GLUCOSEU NEGATIVE 09/13/2017 1647   HGBUR NEGATIVE 09/13/2017 [place] 09/13/2017 [place] 09/13/2017 1647   PROTEINUR 30 (A) 09/13/2017 1647   UROBILINOGEN 0.2 08/20/2014 1613   NITRITE NEGATIVE 09/13/2017 1647   LEUKOCYTESUR TRACE (A) 09/13/2017 1647   Sepsis Labs: _0 (procalcitonin:4,lacticidven:4) ) Recent Results (from the past 240 hour(s))  Blood culture (routine x 2)     Status: None (Preliminary result)   Collection Time: 09/13/17  4:38 PM  Result Value Ref Range Status   Specimen Description BLOOD RIGHT FOREARM  Final   Special Requests   Final    BOTTLES DRAWN AEROBIC AND ANAEROBIC Blood Culture adequate volume   Culture NO GROWTH 3 DAYS  Final   Report Status PENDING  Incomplete  Blood culture (routine x 2)     Status: None (Preliminary result)   Collection Time: 09/13/17  4:50 PM  Result Value Ref Range Status   Specimen Description LEFT ANTECUBITAL  Final   Special Requests   Final    BOTTLES DRAWN AEROBIC AND ANAEROBIC Blood Culture adequate volume   Culture NO GROWTH 3 DAYS  Final   Report Status PENDING  Incomplete  MRSA PCR Screening     Status: None   Collection Time: 09/14/17  2:20 PM  Result Value Ref Range Status   MRSA by PCR NEGATIVE NEGATIVE Final    Comment:        The GeneXpert MRSA Assay (FDA approved for NASAL specimens only), is one component of a comprehensive MRSA colonization surveillance program. It is not intended to diagnose MRSA infection nor to guide or monitor treatment for MRSA infections.   Gram stain     Status: None   Collection Time: 09/15/17  3:30 PM  Result Value Ref Range Status   Specimen Description PLEURAL  Final   Special Requests NONE  Final   Gram Stain   Final    Performed at [facility] PMN AND MONONUCLEAR NO ORGANISMS SEEN    Report Status 09/15/2017 FINAL  Final  Culture, body fluid-bottle     Status: None (Preliminary result)   Collection Time: 09/15/17  4:00 PM  Result Value Ref Range Status   Specimen Description ASCITIC  Final   Special Requests   Final    BOTTLES DRAWN AEROBIC AND ANAEROBIC Blood Culture adequate volume   Culture NO GROWTH < 24 HOURS  Final   Report Status PENDING  Incomplete     Scheduled Meds: . apixaban  2.5 mg Oral BID  . diltiazem  60 mg Oral Q6H  . furosemide  40 mg Intravenous Daily  . hydrocerin   Topical BID  . ipratropium-albuterol  3 mL Nebulization BID  . levothyroxine  25 mcg Intravenous QAC breakfast  . mouth rinse  15 mL Mouth Rinse BID  . metoprolol tartrate  2.5 mg Intravenous Q6H  . pantoprazole (PROTONIX) IV  40 mg Intravenous Q24H  . vancomycin (VANCOCIN) rectal ENEMA  500 mg  Rectal Q6H   Continuous Infusions: . metronidazole Stopped (09/16/17 5397)    Procedures/Studies: Dg Chest 1 View  Result Date: 08/29/2017 INDICATION: Pleural effusions resistant to diuresis. EXAM: ULTRASOUND GUIDED LEFT THORACENTESIS MEDICATIONS: None. COMPLICATIONS: None immediate. PROCEDURE: An ultrasound guided thoracentesis was thoroughly discussed with the patient and questions answered. The benefits, risks, alternatives and complications were also discussed. The patient is at increased bleeding risk due to Eliquis use and we had a lengthy discussion about this; the patient's husband was also called on the telephone. The patient understands and wishes to proceed with the procedure. Written consent was obtained. Ultrasound was performed to localize and mark an adequate pocket of fluid in the left chest. The area was then prepped and draped in the normal sterile fashion. 1% Lidocaine was used for local anesthesia. Under ultrasound guidance a 19 gauge, 7-cm, Yueh catheter was introduced. Thoracentesis was performed until the  available fluid was drained. I scanned the left chest and there was no evidence of bleeding. The catheter was removed and a dressing applied. FINDINGS: A total of approximately 1 L of amber fluid was removed. Samples were sent to the laboratory as requested by the clinical team. A chest x-ray was obtained and shows significant decrease in left pleural effusion, with small volume fluid remaining. No visible pneumothorax. There is a moderate right pleural effusion. Cardiomegaly and vascular congestion. IMPRESSION: 1. Successful ultrasound guided left thoracentesis yielding 1 L of pleural fluid. 2. Minimal fluid remaining on the left. Right pleural effusion is moderate. Electronically Signed   By: Monte Fantasia M.D.   On: 08/29/2017 11:35   Dg Chest 2 View  Result Date: 09/01/2017 CLINICAL DATA:  Pleural effusions EXAM: CHEST  2 VIEW COMPARISON:  08/30/2017 CXR FINDINGS: Stable cardiac silhouette partially obscured by bilateral pleural effusions. Aortic atherosclerosis is noted. Decrease in size of small right pleural effusion with slight increase in left-sided pleural effusion. Adjacent bibasilar atelectasis. Interval decrease in pulmonary vascular redistribution. Osteoarthritis of the [facility] and glenohumeral joint on the right with high-riding right humeral head and remodeling of the undersurface of the acromion. Findings likely reflect chronic rotator cuff tears. IMPRESSION: 1. Slight decrease in right small pleural effusion and minimal increase in small left pleural effusion since prior. 2. Interval decrease in CHF. 3. Aortic atherosclerosis. Electronically Signed   By: Ashley Royalty M.D.   On: 09/01/2017 14:28   Dg Chest 2 View  Result Date: 08/26/2017 CLINICAL DATA:  Patient having increased SOB over the past few days. Patient is on oxygen at home and states that she usually only wears it at night but has been wearing it all day since having SOB. Hx of pna, htn-controlled with medication, COPD. Ex-smoker.  EXAM: CHEST  2 VIEW COMPARISON:  06/28/2017 FINDINGS: The lungs are hyperinflated likely secondary to COPD. There are moderate bilateral pleural effusions with bibasilar atelectasis. There is bilateral diffuse interstitial thickening. There is no pneumothorax. Stable cardiomediastinal silhouette. There is no acute osseous abnormality. There is severe osteoarthritis of the right glenohumeral joint. There is mild osteoarthritis of the left glenohumeral joint. IMPRESSION: 1. Findings consistent with CHF. Electronically Signed   By: Kathreen Devoid   On: 08/26/2017 13:36   Ct Abdomen Pelvis W Contrast  Result Date: 09/13/2017 CLINICAL DATA:  Abdominal distension with vomiting and diarrhea. Shortness of breath. EXAM: CT ABDOMEN AND PELVIS WITH CONTRAST TECHNIQUE: Multidetector CT imaging of the abdomen and pelvis was performed using the standard protocol following bolus administration of intravenous contrast. CONTRAST:  152m ISOVUE-300 IOPAMIDOL (ISOVUE-300) INJECTION 61% COMPARISON:  08/19/2017 FINDINGS: Lower chest: Emphysema. Persistent bibasilar airspace disease. Mild cardiomegaly with LAD coronary artery atherosclerosis. Moderate bilateral pleural effusions, larger on the left. Similar. Hepatobiliary: Perfusion anomaly involving the anterior left hepatic lobe image 33/ series 2. Normal gallbladder, without biliary ductal dilatation. Pancreas: Normal, without mass or ductal dilatation. Spleen: Normal in size, without focal abnormality. Adrenals/Urinary Tract: Normal right adrenal gland. Minimal left adrenal thickening and nodularity. Bilateral renal cortical thinning. Right renal lesions are favored to represent cysts or minimally complex cysts. Bilateral too small to characterize renal lesions. No hydronephrosis. Normal urinary bladder. Stomach/Bowel: Normal stomach, without wall thickening. Scattered colonic diverticula. The colon is relatively decompressed. Small bowel loops are fluid-filled and dilated,  including at up to 5.3 cm. A transition point is identified within the mid to distal ileum, including on images 73 through 77/series 2. No bowel wall thickening to suggest complicating ischemia. No pneumatosis or free intraperitoneal air. Vascular/Lymphatic: Aortic and branch vessel atherosclerosis. No abdominopelvic adenopathy. Reproductive: Hysterectomy. Other: Small volume cul-de-sac fluid including on 77/series 2. Pelvic floor laxity. Musculoskeletal: Osteopenia.  Convex right lumbar spine curvature. IMPRESSION: 1. High-grade partial small bowel obstruction involving the mid to distal ileum. Likely due to adhesions. Although there is adjacent ascites, no specific evidence of complicating ischemia identified. 2. Bilateral pleural effusions and bibasilar atelectasis, as before. 3. Coronary artery atherosclerosis. Aortic Atherosclerosis (ICD10-I70.0). 4. Pelvic floor laxity. Electronically Signed   By: KAbigail MiyamotoM.D.   On: 09/13/2017 20:47   Ct Abdomen Pelvis W Contrast  Result Date: 08/19/2017 CLINICAL DATA:  Diarrhea for 1 week. EXAM: CT ABDOMEN AND PELVIS WITH CONTRAST TECHNIQUE: Multidetector CT imaging of the abdomen and pelvis was performed using the standard protocol following bolus administration of intravenous contrast. CONTRAST:  80 ml ISOVUE-300 IOPAMIDOL (ISOVUE-300) INJECTION 61% COMPARISON:  CT abdomen and pelvis 04/08/2017. FINDINGS: Lower chest: Small to moderate bilateral pleural effusions are present, larger on the left, and increased since the prior CT. Lung bases demonstrate emphysematous disease. Heart size is enlarged. Calcific coronary artery disease is noted. Compressive atelectasis in lung bases is present Hepatobiliary: No focal liver abnormality is seen. 1 cm hypervascular lesion in the anterior left hepatic lobe is better visualized on the prior CT. No gallstones, gallbladder wall thickening, or biliary dilatation. Pancreas: Unremarkable. No pancreatic ductal dilatation or  surrounding inflammatory changes. Spleen: Normal in size without focal abnormality. Adrenals/Urinary Tract: Multiple low attenuating lesions in the right kidney are likely cysts and unchanged. No hydronephrosis. The right kidney is ptotic, unchanged. Urinary bladder appears normal. Adrenal glands are unremarkable. Stomach/Bowel: Extensive diverticulosis without diverticulitis is worst in the sigmoid. The colon otherwise appears normal. Stomach and small bowel appear normal. No evidence of appendicitis. Vascular/Lymphatic: Aortic atherosclerosis. No enlarged abdominal or pelvic lymph nodes. Reproductive: Status post hysterectomy. No adnexal masses. Other: No ascites. Musculoskeletal: Severe convex right scoliosis noted. No lytic or sclerotic lesion. No acute abnormality. IMPRESSION: No acute abnormality abdomen or pelvis. Small to moderate bilateral pleural effusions, larger on the left, increased since the prior CT. Diverticulosis without diverticulitis. Calcific aortic and coronary atherosclerosis. Electronically Signed   By: TInge RiseM.D.   On: 08/19/2017 18:46   Dg Chest Port 1 View  Result Date: 09/15/2017 CLINICAL DATA:  Small bowel obstruction EXAM: PORTABLE CHEST 1 VIEW COMPARISON:  Portable exam 1324 hours compared to 09/13/2017 FINDINGS: Enlargement of cardiac silhouette with pulmonary vascular congestion. Diffuse pulmonary edema consistent with CHF. Associated bibasilar pleural effusions and  atelectasis slightly greater on LEFT, appear progressive since prior study. No pneumothorax. Bones demineralized IMPRESSION: CHF with diffuse pulmonary edema, bibasilar effusions and atelectasis. BILATERAL pleural effusions and basilar atelectasis appear mildly progressive since previous exam Electronically Signed   By: Lavonia Dana M.D.   On: 09/15/2017 14:54   Dg Chest Portable 1 View  Result Date: 09/13/2017 CLINICAL DATA:  Shortness of breath.  Vomiting and diarrhea. EXAM: PORTABLE CHEST 1 VIEW  COMPARISON:  09/01/2017 FINDINGS: Small to moderate bilateral pleural effusions, increased. Diffuse interstitial and airspace opacity, increased. Probable cardiomegaly, although partially obscured. No pneumothorax. IMPRESSION: CHF pattern including small to moderate pleural effusions. Electronically Signed   By: Monte Fantasia M.D.   On: 09/13/2017 17:53   Dg Chest Port 1 View  Result Date: 08/30/2017 CLINICAL DATA:  COPD.  Pleural effusion. EXAM: PORTABLE CHEST 1 VIEW COMPARISON:  08/29/2017. FINDINGS: Cardiomegaly with pulmonary venous congestion and bilateral interstitial prominence with bilateral pleural effusions again noted. Findings consistent with CHF. No significant change from prior study of 08/29/2017. No evidence of pneumothorax status post recent thoracentesis. IMPRESSION: 1. Persistent cardiomegaly with pulmonary venous congestion, bilateral interstitial prominence and bilateral pleural effusions again noted without significant change. Findings consistent CHF. 2. No evidence of progression of pleural effusions. No pneumothorax status post recent left thoracentesis. Electronically Signed   By: Marcello Moores  Register   On: 08/30/2017 08:43   Dg Chest Port 1 View  Result Date: 08/28/2017 CLINICAL DATA:  Hypoxemia, bilateral pleural effusions EXAM: PORTABLE CHEST 1 VIEW COMPARISON:  08/27/2017 FINDINGS: Moderate to large bilateral pleural effusions. Diffuse bilateral airspace disease again noted. No real change since prior study. Cardiomegaly. IMPRESSION: Severe diffuse bilateral airspace disease and a large effusions. No real change. Electronically Signed   By: Rolm Baptise M.D.   On: 08/28/2017 10:27   Dg Chest Port 1 View  Result Date: 08/27/2017 CLINICAL DATA:  Pleural effusion. EXAM: PORTABLE CHEST 1 VIEW COMPARISON:  08/26/2017 FINDINGS: Moderate bilateral pleural effusions. Diffuse bilateral airspace disease, likely edema/ CHF. Cardiomegaly. Slight interval worsening edema since prior study.  IMPRESSION: Worsening aeration of the lungs compatible with worsening edema/ CHF. Moderate bilateral effusions are stable. Electronically Signed   By: Rolm Baptise M.D.   On: 08/27/2017 13:19   Dg Chest Port 1v Same Day  Result Date: 09/15/2017 CLINICAL DATA:  CHF, BILATERAL pleural effusions post LEFT thoracentesis EXAM: PORTABLE CHEST 1 VIEW COMPARISON:  Portable exam 1615 hours compared to earlier study of at 1324 hours FINDINGS: Enlargement of cardiac silhouette with pulmonary vascular congestion. Slightly decreased pulmonary edema in LEFT lung. Decreased LEFT pleural effusion and basilar atelectasis post thoracentesis. No pneumothorax. Persistent RIGHT pulmonary edema, RIGHT pleural effusion and basilar atelectasis. Biapical scarring. Bones demineralized. IMPRESSION: No pneumothorax following LEFT thoracentesis. Electronically Signed   By: Lavonia Dana M.D.   On: 09/15/2017 16:40   Dg Abd 2 Views  Result Date: 09/15/2017 CLINICAL DATA:  Bowel obstruction EXAM: ABDOMEN - 2 VIEW COMPARISON:  September 14, 2017 FINDINGS: Supine and upright images were obtained. There are loops of mildly dilated small bowel in the lower abdomen and pelvis, similar to 1 day prior. There are foci of contrast in the colon. There are scattered colonic diverticula. No evident free air. There is contrast in the urinary bladder. There is lumbar scoliosis. IMPRESSION: Contrast is seen in the colon. There are scattered colonic diverticula. Mild dilatation of several small bowel loops, similar to 1 day prior. No air-fluid levels appreciable. No free air. Electronically Signed  By: Lowella Grip III M.D.   On: 09/15/2017 22:59   Dg Abd 2 Views  Result Date: 09/14/2017 CLINICAL DATA:  Small-bowel obstruction EXAM: ABDOMEN - 2 VIEW COMPARISON:  CT abdomen 09/13/2017 FINDINGS: Dilated small bowel loops in the pelvis similar to improved from the prior study. Contrast in the renal collecting system bilaterally from recent CT. No  renal obstruction. Moderate lumbar scoliosis.  No acute skeletal abnormality. IMPRESSION: Small bowel dilatation similar to slightly improved from yesterday. Electronically Signed   By: Franchot Gallo M.D.   On: 09/14/2017 07:48   US Thoracentesis Asp Pleural Space W/img Guide  Result Date: 09/15/2017 INDICATION: CHF, BILATERAL pleural effusions EXAM: ULTRASOUND GUIDED DIAGNOSTIC AND THERAPEUTIC LEFT THORACENTESIS MEDICATIONS: None. COMPLICATIONS: None immediate. PROCEDURE: Procedure, benefits, and risks of procedure were discussed with patient. Written informed consent for procedure was obtained. Time out protocol followed. Pleural effusion localized by ultrasound at the posterior LEFT hemithorax. Skin prepped and draped in usual sterile fashion. Skin and soft tissues anesthetized with 10 mL of 1% lidocaine. 8 French thoracentesis catheter placed into the LEFT pleural space. 1.3 L of amber colored LEFT pleural fluid was aspirated by syringe pump. Procedure tolerated well by patient without immediate complication. FINDINGS: A total of approximately 1.3 L of LEFT pleural fluid was removed. Samples were sent to the laboratory as requested by the clinical team. IMPRESSION: Successful ultrasound guided LEFT thoracentesis yielding 1.3 of pleural fluid. Electronically Signed   By: Lavonia Dana M.D.   On: 09/15/2017 16:34   US Thoracentesis Asp Pleural Space W/img Guide  Result Date: 08/29/2017 INDICATION: Pleural effusions resistant to diuresis. EXAM: ULTRASOUND GUIDED LEFT THORACENTESIS MEDICATIONS: None. COMPLICATIONS: None immediate. PROCEDURE: An ultrasound guided thoracentesis was thoroughly discussed with the patient and questions answered. The benefits, risks, alternatives and complications were also discussed. The patient is at increased bleeding risk due to Eliquis use and we had a lengthy discussion about this; the patient's husband was also called on the telephone. The patient understands and wishes to  proceed with the procedure. Written consent was obtained. Ultrasound was performed to localize and mark an adequate pocket of fluid in the left chest. The area was then prepped and draped in the normal sterile fashion. 1% Lidocaine was used for local anesthesia. Under ultrasound guidance a 19 gauge, 7-cm, Yueh catheter was introduced. Thoracentesis was performed until the available fluid was drained. I scanned the left chest and there was no evidence of bleeding. The catheter was removed and a dressing applied. FINDINGS: A total of approximately 1 L of amber fluid was removed. Samples were sent to the laboratory as requested by the clinical team. A chest x-ray was obtained and shows significant decrease in left pleural effusion, with small volume fluid remaining. No visible pneumothorax. There is a moderate right pleural effusion. Cardiomegaly and vascular congestion. IMPRESSION: 1. Successful ultrasound guided left thoracentesis yielding 1 L of pleural fluid. 2. Minimal fluid remaining on the left. Right pleural effusion is moderate. Electronically Signed   By: Monte Fantasia M.D.   On: 08/29/2017 11:35    Orson Eva, DO  [facility] Pager [phone]  If 7PM-7AM, please contact night-coverage [email or web address] Password [email or web address] 09/16/2017, 8:45 AM   LOS: 3 days

## 2017-09-16 NOTE — Care Management Note (Signed)
Case Management Note  Patient Details  Name: Andrea [name] MRN: [id number] Date of Birth: 1928/11/18  Subjective/Objective:  Chart reviewed. No CM consult. Patient adm with [facility], recurrent Cdiff. Prior to admission, patient ind with ADL's. Has cane at home pta. Has supplemental oxygen she wears at night provided by [facility]. Currently on HF [place] at 6 liters. Current with [facility] for nursing and had recently finished PT services. Noted that patient has declined further palliative conversations for goals of care and that family has unrealistic expectations. Patient has also not been taking her lasix and antibiotics as prescribed.          Action/Plan: Anticipate DC home with resumption of home health services. Patient will need to wean to her baseline oxygen requirement or have an qualifying oxygen assessment prior to DC. CM will continue to follow. Will discuss with patient option of [facility] referral when appropriate.   Expected Discharge Date:   [place], remains In SDU                Expected Discharge Plan:  [place]  In-House Referral:  Hospice / Palliative Care  Discharge planning Services  CM Consult  Post Acute Care Choice:  Home Health, Resumption of Svcs/PTA Provider Choice offered to:     DME Arranged:    DME Agency:     HH Arranged:    [place] Agency:     Status of Service:  In process, will continue to follow  If discussed at Long Length of Stay Meetings, dates discussed:    Additional Comments:  Delawrence Fridman, Chauncey Reading, RN 09/16/2017, 11:39 AM

## 2017-09-17 LAB — BASIC METABOLIC PANEL
Anion gap: 11 (ref 5–15)
BUN: 37 mg/dL — AB (ref 6–20)
CALCIUM: 8 mg/dL — AB (ref 8.9–10.3)
CO2: 30 mmol/L (ref 22–32)
CREATININE: 1.2 mg/dL — AB (ref 0.44–1.00)
Chloride: 99 mmol/L — ABNORMAL LOW (ref 101–111)
GFR calc Af Amer: 46 mL/min — ABNORMAL LOW (ref >60)
GFR, EST NON AFRICAN AMERICAN: 40 mL/min — AB (ref >60)
Glucose, Bld: 107 mg/dL — ABNORMAL HIGH (ref 65–99)
POTASSIUM: 4.2 mmol/L (ref 3.5–5.1)
SODIUM: 140 mmol/L (ref 135–145)

## 2017-09-17 LAB — CBC
HEMATOCRIT: 31.3 % — AB (ref 36.0–46.0)
Hemoglobin: 9.3 g/dL — ABNORMAL LOW (ref 12.0–15.0)
MCH: 28.2 pg (ref 26.0–34.0)
MCHC: 29.7 g/dL — ABNORMAL LOW (ref 30.0–36.0)
MCV: 94.8 fL (ref 78.0–100.0)
PLATELETS: 280 10*3/uL (ref 150–400)
RBC: 3.3 MIL/uL — ABNORMAL LOW (ref 3.87–5.11)
RDW: 14.6 % (ref 11.5–15.5)
WBC: 10.7 10*3/uL — AB (ref 4.0–10.5)

## 2017-09-17 MED ORDER — FUROSEMIDE 10 MG/ML IJ SOLN
40.0000 mg | Freq: Once | INTRAMUSCULAR | Status: DC
  Administered 2017-09-17: 40 mg via INTRAVENOUS

## 2017-09-17 MED ORDER — LEVALBUTEROL HCL 0.63 MG/3ML IN NEBU
0.6300 mg | INHALATION_SOLUTION | Freq: Three times a day (TID) | RESPIRATORY_TRACT | Status: DC
  Administered 2017-09-17 – 2017-09-21 (×12): 0.63 mg via RESPIRATORY_TRACT
  Filled 2017-09-17 (×11): qty 3

## 2017-09-17 MED ORDER — IPRATROPIUM BROMIDE 0.02 % IN SOLN
0.5000 mg | Freq: Three times a day (TID) | RESPIRATORY_TRACT | Status: DC
  Administered 2017-09-17 – 2017-09-21 (×12): 0.5 mg via RESPIRATORY_TRACT
  Filled 2017-09-17 (×11): qty 2.5

## 2017-09-17 MED ORDER — IPRATROPIUM BROMIDE 0.02 % IN SOLN
RESPIRATORY_TRACT | Status: AC
  Filled 2017-09-17: qty 2.5

## 2017-09-17 MED ORDER — METOPROLOL TARTRATE 5 MG/5ML IV SOLN
5.0000 mg | Freq: Four times a day (QID) | INTRAVENOUS | Status: DC
  Administered 2017-09-17 – 2017-09-18 (×2): 5 mg via INTRAVENOUS
  Filled 2017-09-17 (×2): qty 5

## 2017-09-17 NOTE — Progress Notes (Signed)
The patient and one of her daughter's wanted to know if the patient's allergy to vancomycin was noted in her chart. I told them it was documented under allergies. They questioned why she had still been received the medication, since they had been told it "messed with her kidneys." I will notify RN of patient concern.  Patient stated she "did not need another IS" because she has one at home. I requested she asked family to bring it in.

## 2017-09-17 NOTE — Progress Notes (Addendum)
PROGRESS NOTE  Andrea [name] LYY:[id number] DOB: [birth year] DOA: 09/13/2017 PCP: Unk Pinto, MD  Brief History: 83 year old female with a history of bronchiectasis/COPD, chronic respiratory failure on 2 L at night, hypertension, hyperlipidemia, diastolic CHF, hypothyroidism, and PAT/PAFpresented with 4-5-day history of diarrhea and 1 day history of nausea and vomiting. The patient was recently discharged from the hospital after a stay from August 26, 2017 through September 01, 2017. During that hospital stay, the patient was treated for acute on chronic diastolic CHF as well as finishing treatment for C. difficile colitis with metronidazole.The last day of metronidazole was August 30, 2017. During that hospitalization, the patient also underwent a left-sided thoracocentesis. Fluid studies revealed that it was transudate of. The patient was discharged home with furosemide 20 mg daily. Unfortunately, the patient began having diarrhea 4-5 days prior to this admission. The patient went to see her primary care provider and C. difficile assay was positive again. The patient was restarted on metronidazole. However,the patient states that she was not taking it consistently. In addition, the patient intermittently does not take her cardiac medications including but not limited to metoprolol, diltiazem, and furosemide. The patient complains of dyspnea on exertion, but states that this is not much worse than usual. In addition, the patient states that her lower extremity edema is about the same as usual. She denies any hematochezia, hematemesis, fevers, chills, chest pain, headache, neck pain. Upon presentation, CT of the abdomen and pelvis revealed moderate bilateral pleural effusions, left greater than right. There was also a dilated fluid-filled small bowel loops up to 5.3 cm with a transition point in the mid to distal ileum concerning for bowel obstruction. Chest x-ray revealed  increased interstitial prominence with bilateral pleural effusions.    Assessment/Plan: Acute on chronic diastolic CHF -August 26, 2017 echo EF 60-65%, grade 2 DD, moderate TR, PASP 61 -daily weights--question accuracy -accurate I/O's--not accurate -fluid restrict -ContinueIV Lasix--decreased to once daily on 12/20 due to worsen renal function -cardiology consult appreciated -repeat CXR 12/20 personally reviewed, slight worsen pleural effusions and pulm edema -12/20--Left Thoracocentesis--> 1.3L, transudate -12/21--Right Thoracocentesis--1L, transudate -repeat CXR 12/23  Bilateral Pleural Effusions -transudate by fluid analysis -08/29/17--Left Thora--1L -09/15/17--Left Thora--1.3L -09/16/17--requesting R-Thora--1L  Small bowel obstruction -Likely secondary to C. difficile colitis the patient also had a small bowel obstruction July 2018 secondary to adhesions. -Appreciate surgical consultation-->nonoperative management for now -she is a poor surgical candidate -repeat abd xray--personally reviewed--No Air Fluid Levels, minimal SB dilatation -changed vancomycin to po 12/21 as bowel function returning  Recurrent C. difficile colitis--Severe -Continuevancomycin enemas>>>po vanco on 12/21 as bowel function returning -Continue metronidazole IV while inpatient due to severe Cdiff resulting in SBO -WBC overall trending down  Atrial fibrillation with RVR/Atrial tachycardia -Increase Lopressor IV to 5 mg every 6 hours--HR still 120s -ContinueCardizem drip>>>po dilt on 12/21 -Check TSH--4.511 -pt refuses [place] lovenox--wants Eliquis -I have explained to patient x 2 regarding SBO and questionable absorption of po meds and now with renal failure that apixaban is not a good option presently--she continues to refuse enoxaparin -restart apixaban with improving SB/GI function  Acute on chronic respiratory failure with hypoxia -Secondary to CHFin the setting of  bronchiectasis -09/14/17 evening-->increased oxygen demand to 12 L HFNC -12/22-->3L -Normally on 2 L nasal cannula at home -Wean oxygen back to 2 L for oxygen saturation greater than 92%  Acute on chronic renal failure--CKD3 -baseline creatinine 0.8-1.1 -serum creatinine peaked 1.80 -12/20--Lasix IV decreased to  once daily -defer further adjustment to cardiology  COPD/bronchiectasis/chronic respiratory failure with hypoxia -On 2 L nasal cannula atnighttime at home -continueduonebs -pulmonary consult appreciated  Essential hypertension -Holdingoral metoprolol and diltiazeminitially due to SBO -hydralazine IV prn SBP >180  Anxiety -IV ativan q 8 hrs prn until able to take po xanax  Severe protein calorie malnutrition -Consult nutrition  Goals of Care -family has unrealistic expectations--becomes angry and wants to dismiss physicians/care team members when given any type of non-positive clinical information of any kind -they have dismissed the palliative medicine consultant -remains FULL CODE   Disposition Plan:transfer to tele Family Communication:No family at bedside   Consultants:General surgery; palliative med, cardiology, pulmonology  Code Status: FULL   DVT Prophylaxis: [place] Lovenox   Procedures: As Listed in Progress Note Above  Antibiotics: Vanco po/Enema 12/19>>>       Subjective: Overall she is breathing better but still has dyspnea with minimal to mild exertion.  She denies any chest pain, nausea, vomiting, diarrhea.  She had one bowel movement in the last 24 hours.  She is passing gas.  Denies any headache, neck pain, hemoptysis, hematochezia, melena.  Objective: Vitals:   09/17/17 0720 09/17/17 0800 09/17/17 1105 09/17/17 1321  BP:  (!) 128/100  126/74  Pulse: 97 (!) 122  (!) 130  Resp: (!) 22 (!) 28  19  Temp: 98.8 F (37.1 C)  97.8 F (36.6 C) 98.3 F (36.8 C)  TempSrc: Oral  Oral Oral  SpO2: 96% 92%  98%   Weight:    55.5 kg (122 lb 4.8 oz)  Height:    _0  (1.6 m)    Intake/Output Summary (Last 24 hours) at 09/17/2017 1756 Last data filed at 09/17/2017 0916 Gross per 24 hour  Intake 790 ml  Output 250 ml  Net 540 ml   Weight change:  Exam:   General:  Pt is alert, follows commands appropriately, not in acute distress  HEENT: No icterus, No thrush, No neck mass, [place]/AT  Cardiovascular: IRRR, S1/S2, no rubs, no gallops  Respiratory: Bilateral crackles but diminished breath sounds right base left base>  Abdomen: Soft/+BS, non tender, non distended, no guarding  Extremities: 1+LE edema, No lymphangitis, No petechiae, No rashes, no synovitis   Data Reviewed: I have personally reviewed following labs and imaging studies Basic Metabolic Panel: Recent Labs  Lab 09/13/17 1638 09/14/17 0404 09/15/17 0407 09/16/17 0359 09/17/17 0438  NA 141 142 140 139 140  K 4.1 4.3 4.1 4.0 4.2  CL 96* 100* 99* 99* 99*  CO2 34* _1 GLUCOSE 121* 117* 111* 147* 107*  BUN 27* 29* 41* 43* 37*  CREATININE 1.10* 1.17* 1.80* 1.60* 1.20*  CALCIUM 9.2 8.1* 8.1* 8.2* 8.0*  MG  --   --  1.8  --   --    Liver Function Tests: Recent Labs  Lab 09/13/17 1638 09/14/17 0404 09/16/17 0359  AST _2 ALT _3 ALKPHOS 70 54 59  BILITOT 0.8 0.6 0.4  PROT 7.8 6.4* 6.4*  ALBUMIN 3.2* 2.5* 2.5*   No results for input(s): LIPASE, AMYLASE in the last 168 hours. No results for input(s): AMMONIA in the last 168 hours. Coagulation Profile: No results for input(s): INR, PROTIME in the last 168 hours. CBC: Recent Labs  Lab 09/13/17 1638 09/14/17 0404 09/15/17 0407 09/16/17 0359 09/17/17 0438  WBC 17.1* 13.1* 13.9* 11.9* 10.7*  NEUTROABS 14.4*  --   --   --   --  HGB 11.8* 10.4* 10.6* 10.3* 9.3*  HCT 40.6 36.1 37.4 36.0 31.3*  MCV 94.9 97.6 99.5 97.3 94.8  PLT 385 352 359 274 280   Cardiac Enzymes: Recent Labs  Lab 09/13/17 1638  TROPONINI <0.03   BNP: Invalid  input(s): POCBNP CBG: No results for input(s): GLUCAP in the last 168 hours. HbA1C: No results for input(s): HGBA1C in the last 72 hours. Urine analysis:    Component Value Date/Time   COLORURINE YELLOW 09/13/2017 [place] 09/13/2017 1647   LABSPEC 1.010 09/13/2017 1647   PHURINE 6.0 09/13/2017 1647   GLUCOSEU NEGATIVE 09/13/2017 1647   HGBUR NEGATIVE 09/13/2017 [place] 09/13/2017 [place] 09/13/2017 1647   PROTEINUR 30 (A) 09/13/2017 1647   UROBILINOGEN 0.2 08/20/2014 1613   NITRITE NEGATIVE 09/13/2017 1647   LEUKOCYTESUR TRACE (A) 09/13/2017 1647   Sepsis Labs: _0 (procalcitonin:4,lacticidven:4) ) Recent Results (from the past 240 hour(s))  Blood culture (routine x 2)     Status: None (Preliminary result)   Collection Time: 09/13/17  4:38 PM  Result Value Ref Range Status   Specimen Description BLOOD RIGHT FOREARM  Final   Special Requests   Final    BOTTLES DRAWN AEROBIC AND ANAEROBIC Blood Culture adequate volume   Culture NO GROWTH 4 DAYS  Final   Report Status PENDING  Incomplete  Blood culture (routine x 2)     Status: None (Preliminary result)   Collection Time: 09/13/17  4:50 PM  Result Value Ref Range Status   Specimen Description LEFT ANTECUBITAL  Final   Special Requests   Final    BOTTLES DRAWN AEROBIC AND ANAEROBIC Blood Culture adequate volume   Culture NO GROWTH 4 DAYS  Final   Report Status PENDING  Incomplete  MRSA PCR Screening     Status: None   Collection Time: 09/14/17  2:20 PM  Result Value Ref Range Status   MRSA by PCR NEGATIVE NEGATIVE Final    Comment:        The GeneXpert MRSA Assay (FDA approved for NASAL specimens only), is one component of a comprehensive MRSA colonization surveillance program. It is not intended to diagnose MRSA infection nor to guide or monitor treatment for MRSA infections.   Gram stain     Status: None   Collection Time: 09/15/17  3:30 PM  Result  Value Ref Range Status   Specimen Description PLEURAL  Final   Special Requests NONE  Final   Gram Stain   Final    Performed at [facility] PMN AND MONONUCLEAR NO ORGANISMS SEEN    Report Status 09/15/2017 FINAL  Final  Culture, body fluid-bottle     Status: None (Preliminary result)   Collection Time: 09/15/17  4:00 PM  Result Value Ref Range Status   Specimen Description ASCITIC  Final   Special Requests   Final    BOTTLES DRAWN AEROBIC AND ANAEROBIC Blood Culture adequate volume   Culture NO GROWTH 2 DAYS  Final   Report Status PENDING  Incomplete     Scheduled Meds: . apixaban  2.5 mg Oral BID  . diltiazem  60 mg Oral Q6H  . furosemide  40 mg Intravenous Daily  . furosemide  40 mg Intravenous Once  . hydrocerin   Topical BID  . ipratropium  0.5 mg Nebulization Q8H  . levalbuterol  0.63 mg Nebulization [id number]  . levothyroxine  25 mcg Intravenous QAC breakfast  . mouth  rinse  15 mL Mouth Rinse BID  . metoprolol tartrate  5 mg Intravenous Q6H  . pantoprazole (PROTONIX) IV  40 mg Intravenous Q24H  . vancomycin  500 mg Oral Q6H   Continuous Infusions: . metronidazole Stopped (09/17/17 1705)    Procedures/Studies: Dg Chest 1 View  Result Date: 08/29/2017 INDICATION: Pleural effusions resistant to diuresis. EXAM: ULTRASOUND GUIDED LEFT THORACENTESIS MEDICATIONS: None. COMPLICATIONS: None immediate. PROCEDURE: An ultrasound guided thoracentesis was thoroughly discussed with the patient and questions answered. The benefits, risks, alternatives and complications were also discussed. The patient is at increased bleeding risk due to Eliquis use and we had a lengthy discussion about this; the patient's husband was also called on the telephone. The patient understands and wishes to proceed with the procedure. Written consent was obtained. Ultrasound was performed to localize and mark an adequate pocket of fluid in the left chest. The area  was then prepped and draped in the normal sterile fashion. 1% Lidocaine was used for local anesthesia. Under ultrasound guidance a 19 gauge, 7-cm, Yueh catheter was introduced. Thoracentesis was performed until the available fluid was drained. I scanned the left chest and there was no evidence of bleeding. The catheter was removed and a dressing applied. FINDINGS: A total of approximately 1 L of amber fluid was removed. Samples were sent to the laboratory as requested by the clinical team. A chest x-ray was obtained and shows significant decrease in left pleural effusion, with small volume fluid remaining. No visible pneumothorax. There is a moderate right pleural effusion. Cardiomegaly and vascular congestion. IMPRESSION: 1. Successful ultrasound guided left thoracentesis yielding 1 L of pleural fluid. 2. Minimal fluid remaining on the left. Right pleural effusion is moderate. Electronically Signed   By: Monte Fantasia M.D.   On: 08/29/2017 11:35   Dg Chest 2 View  Result Date: 09/01/2017 CLINICAL DATA:  Pleural effusions EXAM: CHEST  2 VIEW COMPARISON:  08/30/2017 CXR FINDINGS: Stable cardiac silhouette partially obscured by bilateral pleural effusions. Aortic atherosclerosis is noted. Decrease in size of small right pleural effusion with slight increase in left-sided pleural effusion. Adjacent bibasilar atelectasis. Interval decrease in pulmonary vascular redistribution. Osteoarthritis of the [facility] and glenohumeral joint on the right with high-riding right humeral head and remodeling of the undersurface of the acromion. Findings likely reflect chronic rotator cuff tears. IMPRESSION: 1. Slight decrease in right small pleural effusion and minimal increase in small left pleural effusion since prior. 2. Interval decrease in CHF. 3. Aortic atherosclerosis. Electronically Signed   By: Ashley Royalty M.D.   On: 09/01/2017 14:28   Dg Chest 2 View  Result Date: 08/26/2017 CLINICAL DATA:  Patient having increased SOB  over the past few days. Patient is on oxygen at home and states that she usually only wears it at night but has been wearing it all day since having SOB. Hx of pna, htn-controlled with medication, COPD. Ex-smoker. EXAM: CHEST  2 VIEW COMPARISON:  06/28/2017 FINDINGS: The lungs are hyperinflated likely secondary to COPD. There are moderate bilateral pleural effusions with bibasilar atelectasis. There is bilateral diffuse interstitial thickening. There is no pneumothorax. Stable cardiomediastinal silhouette. There is no acute osseous abnormality. There is severe osteoarthritis of the right glenohumeral joint. There is mild osteoarthritis of the left glenohumeral joint. IMPRESSION: 1. Findings consistent with CHF. Electronically Signed   By: Kathreen Devoid   On: 08/26/2017 13:36   Ct Abdomen Pelvis W Contrast  Result Date: 09/13/2017 CLINICAL DATA:  Abdominal distension with vomiting and diarrhea. Shortness  of breath. EXAM: CT ABDOMEN AND PELVIS WITH CONTRAST TECHNIQUE: Multidetector CT imaging of the abdomen and pelvis was performed using the standard protocol following bolus administration of intravenous contrast. CONTRAST:  158m ISOVUE-300 IOPAMIDOL (ISOVUE-300) INJECTION 61% COMPARISON:  08/19/2017 FINDINGS: Lower chest: Emphysema. Persistent bibasilar airspace disease. Mild cardiomegaly with LAD coronary artery atherosclerosis. Moderate bilateral pleural effusions, larger on the left. Similar. Hepatobiliary: Perfusion anomaly involving the anterior left hepatic lobe image 33/ series 2. Normal gallbladder, without biliary ductal dilatation. Pancreas: Normal, without mass or ductal dilatation. Spleen: Normal in size, without focal abnormality. Adrenals/Urinary Tract: Normal right adrenal gland. Minimal left adrenal thickening and nodularity. Bilateral renal cortical thinning. Right renal lesions are favored to represent cysts or minimally complex cysts. Bilateral too small to characterize renal lesions. No  hydronephrosis. Normal urinary bladder. Stomach/Bowel: Normal stomach, without wall thickening. Scattered colonic diverticula. The colon is relatively decompressed. Small bowel loops are fluid-filled and dilated, including at up to 5.3 cm. A transition point is identified within the mid to distal ileum, including on images 73 through 77/series 2. No bowel wall thickening to suggest complicating ischemia. No pneumatosis or free intraperitoneal air. Vascular/Lymphatic: Aortic and branch vessel atherosclerosis. No abdominopelvic adenopathy. Reproductive: Hysterectomy. Other: Small volume cul-de-sac fluid including on 77/series 2. Pelvic floor laxity. Musculoskeletal: Osteopenia.  Convex right lumbar spine curvature. IMPRESSION: 1. High-grade partial small bowel obstruction involving the mid to distal ileum. Likely due to adhesions. Although there is adjacent ascites, no specific evidence of complicating ischemia identified. 2. Bilateral pleural effusions and bibasilar atelectasis, as before. 3. Coronary artery atherosclerosis. Aortic Atherosclerosis (ICD10-I70.0). 4. Pelvic floor laxity. Electronically Signed   By: KAbigail MiyamotoM.D.   On: 09/13/2017 20:47   Ct Abdomen Pelvis W Contrast  Result Date: 08/19/2017 CLINICAL DATA:  Diarrhea for 1 week. EXAM: CT ABDOMEN AND PELVIS WITH CONTRAST TECHNIQUE: Multidetector CT imaging of the abdomen and pelvis was performed using the standard protocol following bolus administration of intravenous contrast. CONTRAST:  80 ml ISOVUE-300 IOPAMIDOL (ISOVUE-300) INJECTION 61% COMPARISON:  CT abdomen and pelvis 04/08/2017. FINDINGS: Lower chest: Small to moderate bilateral pleural effusions are present, larger on the left, and increased since the prior CT. Lung bases demonstrate emphysematous disease. Heart size is enlarged. Calcific coronary artery disease is noted. Compressive atelectasis in lung bases is present Hepatobiliary: No focal liver abnormality is seen. 1 cm  hypervascular lesion in the anterior left hepatic lobe is better visualized on the prior CT. No gallstones, gallbladder wall thickening, or biliary dilatation. Pancreas: Unremarkable. No pancreatic ductal dilatation or surrounding inflammatory changes. Spleen: Normal in size without focal abnormality. Adrenals/Urinary Tract: Multiple low attenuating lesions in the right kidney are likely cysts and unchanged. No hydronephrosis. The right kidney is ptotic, unchanged. Urinary bladder appears normal. Adrenal glands are unremarkable. Stomach/Bowel: Extensive diverticulosis without diverticulitis is worst in the sigmoid. The colon otherwise appears normal. Stomach and small bowel appear normal. No evidence of appendicitis. Vascular/Lymphatic: Aortic atherosclerosis. No enlarged abdominal or pelvic lymph nodes. Reproductive: Status post hysterectomy. No adnexal masses. Other: No ascites. Musculoskeletal: Severe convex right scoliosis noted. No lytic or sclerotic lesion. No acute abnormality. IMPRESSION: No acute abnormality abdomen or pelvis. Small to moderate bilateral pleural effusions, larger on the left, increased since the prior CT. Diverticulosis without diverticulitis. Calcific aortic and coronary atherosclerosis. Electronically Signed   By: TInge RiseM.D.   On: 08/19/2017 18:46   Dg Chest Port 1 View  Result Date: 09/16/2017 CLINICAL DATA:  RIGHT pleural effusion post thoracentesis,  CHF EXAM: PORTABLE CHEST 1 VIEW COMPARISON:  Portable exam 0240 hours compared to 09/15/2017 FINDINGS: Small bibasilar pleural effusions and atelectasis. Persistent pulmonary edema increased on LEFT since prior exam. No pneumothorax following RIGHT thoracentesis. Persistent enlargement of cardiac silhouette with pulmonary vascular congestion. Atherosclerotic calcification aorta. Bones demineralized with BILATERAL chronic rotator cuff tears. IMPRESSION: No pneumothorax following RIGHT thoracentesis. Persistent pulmonary edema  increased since previous exam with residual bibasilar effusions and atelectasis. Electronically Signed   By: Lavonia Dana M.D.   On: 09/16/2017 13:24   Dg Chest Port 1 View  Result Date: 09/15/2017 CLINICAL DATA:  Small bowel obstruction EXAM: PORTABLE CHEST 1 VIEW COMPARISON:  Portable exam 1324 hours compared to 09/13/2017 FINDINGS: Enlargement of cardiac silhouette with pulmonary vascular congestion. Diffuse pulmonary edema consistent with CHF. Associated bibasilar pleural effusions and atelectasis slightly greater on LEFT, appear progressive since prior study. No pneumothorax. Bones demineralized IMPRESSION: CHF with diffuse pulmonary edema, bibasilar effusions and atelectasis. BILATERAL pleural effusions and basilar atelectasis appear mildly progressive since previous exam Electronically Signed   By: Lavonia Dana M.D.   On: 09/15/2017 14:54   Dg Chest Portable 1 View  Result Date: 09/13/2017 CLINICAL DATA:  Shortness of breath.  Vomiting and diarrhea. EXAM: PORTABLE CHEST 1 VIEW COMPARISON:  09/01/2017 FINDINGS: Small to moderate bilateral pleural effusions, increased. Diffuse interstitial and airspace opacity, increased. Probable cardiomegaly, although partially obscured. No pneumothorax. IMPRESSION: CHF pattern including small to moderate pleural effusions. Electronically Signed   By: Monte Fantasia M.D.   On: 09/13/2017 17:53   Dg Chest Port 1 View  Result Date: 08/30/2017 CLINICAL DATA:  COPD.  Pleural effusion. EXAM: PORTABLE CHEST 1 VIEW COMPARISON:  08/29/2017. FINDINGS: Cardiomegaly with pulmonary venous congestion and bilateral interstitial prominence with bilateral pleural effusions again noted. Findings consistent with CHF. No significant change from prior study of 08/29/2017. No evidence of pneumothorax status post recent thoracentesis. IMPRESSION: 1. Persistent cardiomegaly with pulmonary venous congestion, bilateral interstitial prominence and bilateral pleural effusions again noted  without significant change. Findings consistent CHF. 2. No evidence of progression of pleural effusions. No pneumothorax status post recent left thoracentesis. Electronically Signed   By: Marcello Moores  Register   On: 08/30/2017 08:43   Dg Chest Port 1 View  Result Date: 08/28/2017 CLINICAL DATA:  Hypoxemia, bilateral pleural effusions EXAM: PORTABLE CHEST 1 VIEW COMPARISON:  08/27/2017 FINDINGS: Moderate to large bilateral pleural effusions. Diffuse bilateral airspace disease again noted. No real change since prior study. Cardiomegaly. IMPRESSION: Severe diffuse bilateral airspace disease and a large effusions. No real change. Electronically Signed   By: Rolm Baptise M.D.   On: 08/28/2017 10:27   Dg Chest Port 1 View  Result Date: 08/27/2017 CLINICAL DATA:  Pleural effusion. EXAM: PORTABLE CHEST 1 VIEW COMPARISON:  08/26/2017 FINDINGS: Moderate bilateral pleural effusions. Diffuse bilateral airspace disease, likely edema/ CHF. Cardiomegaly. Slight interval worsening edema since prior study. IMPRESSION: Worsening aeration of the lungs compatible with worsening edema/ CHF. Moderate bilateral effusions are stable. Electronically Signed   By: Rolm Baptise M.D.   On: 08/27/2017 13:19   Dg Chest Port 1v Same Day  Result Date: 09/15/2017 CLINICAL DATA:  CHF, BILATERAL pleural effusions post LEFT thoracentesis EXAM: PORTABLE CHEST 1 VIEW COMPARISON:  Portable exam 1615 hours compared to earlier study of at 1324 hours FINDINGS: Enlargement of cardiac silhouette with pulmonary vascular congestion. Slightly decreased pulmonary edema in LEFT lung. Decreased LEFT pleural effusion and basilar atelectasis post thoracentesis. No pneumothorax. Persistent RIGHT pulmonary edema, RIGHT pleural effusion  and basilar atelectasis. Biapical scarring. Bones demineralized. IMPRESSION: No pneumothorax following LEFT thoracentesis. Electronically Signed   By: Lavonia Dana M.D.   On: 09/15/2017 16:40   Dg Abd 2 Views  Result Date:  09/15/2017 CLINICAL DATA:  Bowel obstruction EXAM: ABDOMEN - 2 VIEW COMPARISON:  September 14, 2017 FINDINGS: Supine and upright images were obtained. There are loops of mildly dilated small bowel in the lower abdomen and pelvis, similar to 1 day prior. There are foci of contrast in the colon. There are scattered colonic diverticula. No evident free air. There is contrast in the urinary bladder. There is lumbar scoliosis. IMPRESSION: Contrast is seen in the colon. There are scattered colonic diverticula. Mild dilatation of several small bowel loops, similar to 1 day prior. No air-fluid levels appreciable. No free air. Electronically Signed   By: Lowella Grip III M.D.   On: 09/15/2017 22:59   Dg Abd 2 Views  Result Date: 09/14/2017 CLINICAL DATA:  Small-bowel obstruction EXAM: ABDOMEN - 2 VIEW COMPARISON:  CT abdomen 09/13/2017 FINDINGS: Dilated small bowel loops in the pelvis similar to improved from the prior study. Contrast in the renal collecting system bilaterally from recent CT. No renal obstruction. Moderate lumbar scoliosis.  No acute skeletal abnormality. IMPRESSION: Small bowel dilatation similar to slightly improved from yesterday. Electronically Signed   By: Franchot Gallo M.D.   On: 09/14/2017 07:48   US Thoracentesis Asp Pleural Space W/img Guide  Result Date: 09/16/2017 INDICATION: CHF, BILATERAL pleural effusions, prior LEFT thoracentesis, for RIGHT thoracentesis TODAY EXAM: ULTRASOUND GUIDED DIAGNOSTIC AND THERAPEUTIC RIGHT THORACENTESIS MEDICATIONS: None. COMPLICATIONS: None immediate. PROCEDURE: Procedure, benefits, and risks of procedure were discussed with patient. Written informed consent for procedure was obtained. Time out protocol followed. Pleural effusion localized by ultrasound at the posterior RIGHT hemithorax. Skin prepped and draped in usual sterile fashion. Skin and soft tissues anesthetized with 10 mL of 1% lidocaine. 8 French thoracentesis catheter placed into the  RIGHT pleural space. 1 L of amber colored RIGHT pleural fluid aspirated by vacuum bottle. Procedure tolerated well by patient without immediate complication. FINDINGS: A total of approximately 1 L of RIGHT pleural fluid was removed. Samples were sent to the laboratory as requested by the clinical team. IMPRESSION: Successful ultrasound guided RIGHT thoracentesis yielding 1 of pleural fluid. Electronically Signed   By: Lavonia Dana M.D.   On: 09/16/2017 13:28   US Thoracentesis Asp Pleural Space W/img Guide  Result Date: 09/15/2017 INDICATION: CHF, BILATERAL pleural effusions EXAM: ULTRASOUND GUIDED DIAGNOSTIC AND THERAPEUTIC LEFT THORACENTESIS MEDICATIONS: None. COMPLICATIONS: None immediate. PROCEDURE: Procedure, benefits, and risks of procedure were discussed with patient. Written informed consent for procedure was obtained. Time out protocol followed. Pleural effusion localized by ultrasound at the posterior LEFT hemithorax. Skin prepped and draped in usual sterile fashion. Skin and soft tissues anesthetized with 10 mL of 1% lidocaine. 8 French thoracentesis catheter placed into the LEFT pleural space. 1.3 L of amber colored LEFT pleural fluid was aspirated by syringe pump. Procedure tolerated well by patient without immediate complication. FINDINGS: A total of approximately 1.3 L of LEFT pleural fluid was removed. Samples were sent to the laboratory as requested by the clinical team. IMPRESSION: Successful ultrasound guided LEFT thoracentesis yielding 1.3 of pleural fluid. Electronically Signed   By: Lavonia Dana M.D.   On: 09/15/2017 16:34   US Thoracentesis Asp Pleural Space W/img Guide  Result Date: 08/29/2017 INDICATION: Pleural effusions resistant to diuresis. EXAM: ULTRASOUND GUIDED LEFT THORACENTESIS MEDICATIONS: None. COMPLICATIONS: None immediate.  PROCEDURE: An ultrasound guided thoracentesis was thoroughly discussed with the patient and questions answered. The benefits, risks, alternatives and  complications were also discussed. The patient is at increased bleeding risk due to Eliquis use and we had a lengthy discussion about this; the patient's husband was also called on the telephone. The patient understands and wishes to proceed with the procedure. Written consent was obtained. Ultrasound was performed to localize and mark an adequate pocket of fluid in the left chest. The area was then prepped and draped in the normal sterile fashion. 1% Lidocaine was used for local anesthesia. Under ultrasound guidance a 19 gauge, 7-cm, Yueh catheter was introduced. Thoracentesis was performed until the available fluid was drained. I scanned the left chest and there was no evidence of bleeding. The catheter was removed and a dressing applied. FINDINGS: A total of approximately 1 L of amber fluid was removed. Samples were sent to the laboratory as requested by the clinical team. A chest x-ray was obtained and shows significant decrease in left pleural effusion, with small volume fluid remaining. No visible pneumothorax. There is a moderate right pleural effusion. Cardiomegaly and vascular congestion. IMPRESSION: 1. Successful ultrasound guided left thoracentesis yielding 1 L of pleural fluid. 2. Minimal fluid remaining on the left. Right pleural effusion is moderate. Electronically Signed   By: Monte Fantasia M.D.   On: 08/29/2017 11:35    Orson Eva, DO  [facility] Pager [phone]  If 7PM-7AM, please contact night-coverage [email or web address] Password [email or web address] 09/17/2017, 5:56 PM   LOS: 4 days

## 2017-09-17 NOTE — Progress Notes (Signed)
Subjective: She says she feels a little better.  She is sitting up in a chair.  She had ultrasound guided thoracentesis of the right pleural effusion yesterday.  She is still on 5 L nasal oxygen now but she is at 100% saturation so I think we can probably taper that down.  She does not complain of shortness of breath.  She says her abdomen is better.  Objective: Vital signs in last 24 hours: Temp:  [98.4 F (36.9 C)-98.8 F (37.1 C)] 98.8 F (37.1 C) (12/22 0720) Pulse Rate:  [34-140] 122 (12/22 0800) Resp:  [16-32] 28 (12/22 0800) BP: (92-134)/(45-108) 128/100 (12/22 0800) SpO2:  [86 %-100 %] 92 % (12/22 0800) Weight change:  Last BM Date: 09/17/17  Intake/Output from previous day: 12/21 0701 - 12/22 0700 In: 788.9 [P.O.:450; I.V.:38.9; IV Piggyback:300] Out: 250 [Urine:250]  PHYSICAL EXAM General appearance: alert, cooperative, mild distress and Thin Resp: clear to auscultation bilaterally Cardio: Heart is still irregular with murmur.  I do not hear a gallop GI: Somewhat protuberant but soft compared to yesterday Extremities: She still has edema of the lower extremity Skin warm and dry  Lab Results:  Results for orders placed or performed during the hospital encounter of 09/13/17 (from the past 48 hour(s))  Lactate dehydrogenase (pleural or peritoneal fluid)     Status: Abnormal   Collection Time: 09/15/17  3:30 PM  Result Value Ref Range   LD, Fluid 51 (H) 3 - 23 U/L    Comment: (NOTE) Results should be evaluated in conjunction with serum values    Fluid Type-FLDH Pleural, L   Body fluid cell count with differential     Status: Abnormal   Collection Time: 09/15/17  3:30 PM  Result Value Ref Range   Fluid Type-FCT Pleural, L    Color, Fluid YELLOW (A) YELLOW   Appearance, Fluid CLEAR CLEAR   WBC, Fluid 568 0 - 1,000 cu mm   Neutrophil Count, Fluid 14 0 - 25 %   Lymphs, Fluid 74 %   Monocyte-Macrophage-Serous Fluid 12 (L) 50 - 90 %   Eos, Fluid 0 %   Other Cells,  Fluid OTHER CELLS IDENTIFIED AS MESOTHELIAL CELLS %  Gram stain     Status: None   Collection Time: 09/15/17  3:30 PM  Result Value Ref Range   Specimen Description PLEURAL    Special Requests NONE    Gram Stain      Performed at [place] PMN AND MONONUCLEAR NO ORGANISMS SEEN    Report Status 09/15/2017 FINAL   Protein, pleural or peritoneal fluid     Status: None   Collection Time: 09/15/17  3:30 PM  Result Value Ref Range   Total protein, fluid (NOTE) g/dL    Comment: No normal range established for this test Results should be evaluated in conjunction with serum values    Fluid Type-FTP Pleural, L   Glucose, pleural or peritoneal fluid     Status: None   Collection Time: 09/15/17  3:30 PM  Result Value Ref Range   Glucose, Fluid 157 mg/dL    Comment: (NOTE) No normal range established for this test Results should be evaluated in conjunction with serum values    Fluid Type-FGLU Pleural, L   Culture, body fluid-bottle     Status: None (Preliminary result)   Collection Time: 09/15/17  4:00 PM  Result Value Ref Range   Specimen Description ASCITIC    Special Requests  BOTTLES DRAWN AEROBIC AND ANAEROBIC Blood Culture adequate volume   Culture NO GROWTH 2 DAYS    Report Status PENDING   Lactate dehydrogenase     Status: None   Collection Time: 09/16/17  3:59 AM  Result Value Ref Range   LDH 121 98 - 192 U/L  Hepatic function panel     Status: Abnormal   Collection Time: 09/16/17  3:59 AM  Result Value Ref Range   Total Protein 6.4 (L) 6.5 - 8.1 g/dL   Albumin 2.5 (L) 3.5 - 5.0 g/dL   AST 18 15 - 41 U/L   ALT 14 14 - 54 U/L   Alkaline Phosphatase 59 38 - 126 U/L   Total Bilirubin 0.4 0.3 - 1.2 mg/dL   Bilirubin, Direct 0.1 0.1 - 0.5 mg/dL   Indirect Bilirubin 0.3 0.3 - 0.9 mg/dL  Basic metabolic panel     Status: Abnormal   Collection Time: 09/16/17  3:59 AM  Result Value Ref Range   Sodium 139 135 - 145 mmol/L    Potassium 4.0 3.5 - 5.1 mmol/L   Chloride 99 (L) 101 - 111 mmol/L   CO2 30 22 - 32 mmol/L   Glucose, Bld 147 (H) 65 - 99 mg/dL   BUN 43 (H) 6 - 20 mg/dL   Creatinine, Ser 1.60 (H) 0.44 - 1.00 mg/dL   Calcium 8.2 (L) 8.9 - 10.3 mg/dL   GFR calc non Af Amer 28 (L) >60 mL/min   GFR calc Af Amer 33 (L) >60 mL/min    Comment: (NOTE) The eGFR has been calculated using the CKD EPI equation. This calculation has not been validated in all clinical situations. eGFR's persistently <60 mL/min signify possible Chronic Kidney Disease.    Anion gap 10 5 - 15  CBC     Status: Abnormal   Collection Time: 09/16/17  3:59 AM  Result Value Ref Range   WBC 11.9 (H) 4.0 - 10.5 K/uL   RBC 3.70 (L) 3.87 - 5.11 MIL/uL   Hemoglobin 10.3 (L) 12.0 - 15.0 g/dL   HCT 36.0 36.0 - 46.0 %   MCV 97.3 78.0 - 100.0 fL   MCH 27.8 26.0 - 34.0 pg   MCHC 28.6 (L) 30.0 - 36.0 g/dL   RDW 14.4 11.5 - 15.5 %   Platelets 274 150 - 400 K/uL  Lactate dehydrogenase (pleural or peritoneal fluid)     Status: Abnormal   Collection Time: 09/16/17 12:30 PM  Result Value Ref Range   LD, Fluid 62 (H) 3 - 23 U/L   Fluid Type-FLDH PLEURAL     Comment: CORRECTED ON 12/21 AT 1304: PREVIOUSLY REPORTED AS Pleural R  Protein, pleural or peritoneal fluid     Status: None   Collection Time: 09/16/17 12:30 PM  Result Value Ref Range   Total protein, fluid <3.0 g/dL   Fluid Type-FTP PLEURAL     Comment: CORRECTED ON 12/21 AT 1304: PREVIOUSLY REPORTED AS Pleural R  Basic metabolic panel     Status: Abnormal   Collection Time: 09/17/17  4:38 AM  Result Value Ref Range   Sodium 140 135 - 145 mmol/L   Potassium 4.2 3.5 - 5.1 mmol/L   Chloride 99 (L) 101 - 111 mmol/L   CO2 30 22 - 32 mmol/L   Glucose, Bld 107 (H) 65 - 99 mg/dL   BUN 37 (H) 6 - 20 mg/dL   Creatinine, Ser 1.20 (H) 0.44 - 1.00 mg/dL   Calcium 8.0 (L) 8.9 -  10.3 mg/dL   GFR calc non Af Amer 40 (L) >60 mL/min   GFR calc Af Amer 46 (L) >60 mL/min    Comment: (NOTE) The  eGFR has been calculated using the CKD EPI equation. This calculation has not been validated in all clinical situations. eGFR's persistently <60 mL/min signify possible Chronic Kidney Disease.    Anion gap 11 5 - 15  CBC     Status: Abnormal   Collection Time: 09/17/17  4:38 AM  Result Value Ref Range   WBC 10.7 (H) 4.0 - 10.5 K/uL   RBC 3.30 (L) 3.87 - 5.11 MIL/uL   Hemoglobin 9.3 (L) 12.0 - 15.0 g/dL   HCT 31.3 (L) 36.0 - 46.0 %   MCV 94.8 78.0 - 100.0 fL   MCH 28.2 26.0 - 34.0 pg   MCHC 29.7 (L) 30.0 - 36.0 g/dL   RDW 14.6 11.5 - 15.5 %   Platelets 280 150 - 400 K/uL    ABGS No results for input(s): PHART, PO2ART, TCO2, HCO3 in the last 72 hours.  Invalid input(s): PCO2 CULTURES Recent Results (from the past 240 hour(s))  Blood culture (routine x 2)     Status: None (Preliminary result)   Collection Time: 09/13/17  4:38 PM  Result Value Ref Range Status   Specimen Description BLOOD RIGHT FOREARM  Final   Special Requests   Final    BOTTLES DRAWN AEROBIC AND ANAEROBIC Blood Culture adequate volume   Culture NO GROWTH 4 DAYS  Final   Report Status PENDING  Incomplete  Blood culture (routine x 2)     Status: None (Preliminary result)   Collection Time: 09/13/17  4:50 PM  Result Value Ref Range Status   Specimen Description LEFT ANTECUBITAL  Final   Special Requests   Final    BOTTLES DRAWN AEROBIC AND ANAEROBIC Blood Culture adequate volume   Culture NO GROWTH 4 DAYS  Final   Report Status PENDING  Incomplete  MRSA PCR Screening     Status: None   Collection Time: 09/14/17  2:20 PM  Result Value Ref Range Status   MRSA by PCR NEGATIVE NEGATIVE Final    Comment:        The GeneXpert MRSA Assay (FDA approved for NASAL specimens only), is one component of a comprehensive MRSA colonization surveillance program. It is not intended to diagnose MRSA infection nor to guide or monitor treatment for MRSA infections.   Gram stain     Status: None   Collection Time:  09/15/17  3:30 PM  Result Value Ref Range Status   Specimen Description PLEURAL  Final   Special Requests NONE  Final   Gram Stain   Final    Performed at [place] PMN AND MONONUCLEAR NO ORGANISMS SEEN    Report Status 09/15/2017 FINAL  Final  Culture, body fluid-bottle     Status: None (Preliminary result)   Collection Time: 09/15/17  4:00 PM  Result Value Ref Range Status   Specimen Description ASCITIC  Final   Special Requests   Final    BOTTLES DRAWN AEROBIC AND ANAEROBIC Blood Culture adequate volume   Culture NO GROWTH 2 DAYS  Final   Report Status PENDING  Incomplete   Studies/Results: Dg Chest Port 1 View  Result Date: 09/16/2017 CLINICAL DATA:  RIGHT pleural effusion post thoracentesis, CHF EXAM: PORTABLE CHEST 1 VIEW COMPARISON:  Portable exam 3710 hours compared to 09/15/2017 FINDINGS: Small bibasilar pleural effusions and atelectasis.  Persistent pulmonary edema increased on LEFT since prior exam. No pneumothorax following RIGHT thoracentesis. Persistent enlargement of cardiac silhouette with pulmonary vascular congestion. Atherosclerotic calcification aorta. Bones demineralized with BILATERAL chronic rotator cuff tears. IMPRESSION: No pneumothorax following RIGHT thoracentesis. Persistent pulmonary edema increased since previous exam with residual bibasilar effusions and atelectasis. Electronically Signed   By: Lavonia Dana M.D.   On: 09/16/2017 13:24   Dg Chest Port 1 View  Result Date: 09/15/2017 CLINICAL DATA:  Small bowel obstruction EXAM: PORTABLE CHEST 1 VIEW COMPARISON:  Portable exam 1324 hours compared to 09/13/2017 FINDINGS: Enlargement of cardiac silhouette with pulmonary vascular congestion. Diffuse pulmonary edema consistent with CHF. Associated bibasilar pleural effusions and atelectasis slightly greater on LEFT, appear progressive since prior study. No pneumothorax. Bones demineralized IMPRESSION: CHF with diffuse  pulmonary edema, bibasilar effusions and atelectasis. BILATERAL pleural effusions and basilar atelectasis appear mildly progressive since previous exam Electronically Signed   By: Lavonia Dana M.D.   On: 09/15/2017 14:54   Dg Chest Port 1v Same Day  Result Date: 09/15/2017 CLINICAL DATA:  CHF, BILATERAL pleural effusions post LEFT thoracentesis EXAM: PORTABLE CHEST 1 VIEW COMPARISON:  Portable exam 1615 hours compared to earlier study of at 1324 hours FINDINGS: Enlargement of cardiac silhouette with pulmonary vascular congestion. Slightly decreased pulmonary edema in LEFT lung. Decreased LEFT pleural effusion and basilar atelectasis post thoracentesis. No pneumothorax. Persistent RIGHT pulmonary edema, RIGHT pleural effusion and basilar atelectasis. Biapical scarring. Bones demineralized. IMPRESSION: No pneumothorax following LEFT thoracentesis. Electronically Signed   By: Lavonia Dana M.D.   On: 09/15/2017 16:40   Dg Abd 2 Views  Result Date: 09/15/2017 CLINICAL DATA:  Bowel obstruction EXAM: ABDOMEN - 2 VIEW COMPARISON:  September 14, 2017 FINDINGS: Supine and upright images were obtained. There are loops of mildly dilated small bowel in the lower abdomen and pelvis, similar to 1 day prior. There are foci of contrast in the colon. There are scattered colonic diverticula. No evident free air. There is contrast in the urinary bladder. There is lumbar scoliosis. IMPRESSION: Contrast is seen in the colon. There are scattered colonic diverticula. Mild dilatation of several small bowel loops, similar to 1 day prior. No air-fluid levels appreciable. No free air. Electronically Signed   By: Lowella Grip III M.D.   On: 09/15/2017 22:59   US Thoracentesis Asp Pleural Space W/img Guide  Result Date: 09/16/2017 INDICATION: CHF, BILATERAL pleural effusions, prior LEFT thoracentesis, for RIGHT thoracentesis TODAY EXAM: ULTRASOUND GUIDED DIAGNOSTIC AND THERAPEUTIC RIGHT THORACENTESIS MEDICATIONS: None.  COMPLICATIONS: None immediate. PROCEDURE: Procedure, benefits, and risks of procedure were discussed with patient. Written informed consent for procedure was obtained. Time out protocol followed. Pleural effusion localized by ultrasound at the posterior RIGHT hemithorax. Skin prepped and draped in usual sterile fashion. Skin and soft tissues anesthetized with 10 mL of 1% lidocaine. 8 French thoracentesis catheter placed into the RIGHT pleural space. 1 L of amber colored RIGHT pleural fluid aspirated by vacuum bottle. Procedure tolerated well by patient without immediate complication. FINDINGS: A total of approximately 1 L of RIGHT pleural fluid was removed. Samples were sent to the laboratory as requested by the clinical team. IMPRESSION: Successful ultrasound guided RIGHT thoracentesis yielding 1 of pleural fluid. Electronically Signed   By: Lavonia Dana M.D.   On: 09/16/2017 13:28   US Thoracentesis Asp Pleural Space W/img Guide  Result Date: 09/15/2017 INDICATION: CHF, BILATERAL pleural effusions EXAM: ULTRASOUND GUIDED DIAGNOSTIC AND THERAPEUTIC LEFT THORACENTESIS MEDICATIONS: None. COMPLICATIONS: None immediate. PROCEDURE: Procedure, benefits,  and risks of procedure were discussed with patient. Written informed consent for procedure was obtained. Time out protocol followed. Pleural effusion localized by ultrasound at the posterior LEFT hemithorax. Skin prepped and draped in usual sterile fashion. Skin and soft tissues anesthetized with 10 mL of 1% lidocaine. 8 French thoracentesis catheter placed into the LEFT pleural space. 1.3 L of amber colored LEFT pleural fluid was aspirated by syringe pump. Procedure tolerated well by patient without immediate complication. FINDINGS: A total of approximately 1.3 L of LEFT pleural fluid was removed. Samples were sent to the laboratory as requested by the clinical team. IMPRESSION: Successful ultrasound guided LEFT thoracentesis yielding 1.3 of pleural fluid.  Electronically Signed   By: Lavonia Dana M.D.   On: 09/15/2017 16:34    Medications:  Prior to Admission:  Medications Prior to Admission  Medication Sig Dispense Refill Last Dose  . ALPRAZolam (XANAX) 0.25 MG tablet Take 0.125-0.25 mg by mouth at bedtime.    09/12/2017 at Unknown time  . apixaban (ELIQUIS) 2.5 MG TABS tablet Take 1 tablet (2.5 mg total) by mouth 2 (two) times daily. 180 tablet 3 09/13/2017 at 1100a  . diltiazem (CARDIZEM) 60 MG tablet Take 60 mg by mouth 4 (four) times daily.    09/13/2017 at Unknown time  . furosemide (LASIX) 20 MG tablet Take 1 tablet (20 mg total) by mouth daily. 30 tablet 0 09/13/2017 at Unknown time  . levothyroxine (SYNTHROID, LEVOTHROID) 50 MCG tablet TAKE ONE TABLET BY MOUTH ONCE A DAY AS DIRECTED 90 tablet 1 09/13/2017 at Unknown time  . Metoprolol Tartrate 37.5 MG TABS Take 37.5 mg by mouth 2 (two) times daily. 180 tablet 3 09/13/2017 at 1100a  . Probiotic Product (PROBIOTIC-10) CHEW Chew 2 tablets by mouth daily.   Past Week at Unknown time  . metroNIDAZOLE (FLAGYL) 500 MG tablet Take 1 tablet 3 x / day after meals for infection (Patient not taking: Reported on 09/13/2017) 42 tablet 0 Not Taking at Unknown time  . silver sulfADIAZINE (SILVADENE) 1 % cream Apply to affected area daily (Patient not taking: Reported on 09/13/2017) 50 g 1 Completed Course at Unknown time  . traMADol (ULTRAM) 50 MG tablet Take 1 tablet (50 mg total) by mouth every 6 (six) hours as needed. 20 tablet 0 UNKNOWN   Scheduled: . apixaban  2.5 mg Oral BID  . diltiazem  60 mg Oral Q6H  . furosemide  40 mg Intravenous Daily  . hydrocerin   Topical BID  . ipratropium  0.5 mg Nebulization Q8H  . levalbuterol  0.63 mg Nebulization [id number]  . levothyroxine  25 mcg Intravenous QAC breakfast  . mouth rinse  15 mL Mouth Rinse BID  . metoprolol tartrate  2.5 mg Intravenous Q6H  . pantoprazole (PROTONIX) IV  40 mg Intravenous Q24H  . vancomycin  500 mg Oral Q6H   Continuous: .  metronidazole Stopped (09/17/17 0149)   PHX:[id number] **OR** acetaminophen, ALPRAZolam, LORazepam, ondansetron **OR** ondansetron (ZOFRAN) IV  Assesment: She has acute on chronic hypoxic respiratory failure which is multifactorial and includes COPD, protein calorie malnutrition, acute on chronic diastolic heart failure, bilateral pleural effusions.  She has also developed atrial fib with rapid response which is better.  She had thoracentesis now of both sides with removal of around a liter on each she feels better.  She is still requiring more oxygen than she does at home but I think we can probably taper it some.  Incentive spirometry may help keep her from reaccumulating fluid  but I think is mostly related to her heart disease. Active Problems:   Acute on chronic respiratory failure with hypoxia (HCC)   AF (paroxysmal atrial fibrillation) (HCC)   Protein-calorie malnutrition, severe (HCC)   Acute on chronic diastolic CHF (congestive heart failure) (HCC)   SBO (small bowel obstruction) (HCC)   C. difficile colitis   Bronchiectasis ([place])   Congestive heart failure ([place])   Palliative care encounter   Goals of care, counseling/discussion   DNR (do not resuscitate) discussion   Recurrent left pleural effusion   S/P thoracentesis    Plan: Continue treatments.  Taper oxygen if possible.  Add incentive spirometry.    LOS: 4 days   Zacchaeus Halm L 09/17/2017, 9:33 AM

## 2017-09-18 ENCOUNTER — Inpatient Hospital Stay (HOSPITAL_COMMUNITY): Payer: Medicare Other

## 2017-09-18 LAB — BASIC METABOLIC PANEL
ANION GAP: 12 (ref 5–15)
BUN: 28 mg/dL — ABNORMAL HIGH (ref 6–20)
CALCIUM: 8.2 mg/dL — AB (ref 8.9–10.3)
CHLORIDE: 97 mmol/L — AB (ref 101–111)
CO2: 31 mmol/L (ref 22–32)
Creatinine, Ser: 1.02 mg/dL — ABNORMAL HIGH (ref 0.44–1.00)
GFR calc Af Amer: 56 mL/min — ABNORMAL LOW (ref >60)
GFR calc non Af Amer: 48 mL/min — ABNORMAL LOW (ref >60)
GLUCOSE: 115 mg/dL — AB (ref 65–99)
Potassium: 3.4 mmol/L — ABNORMAL LOW (ref 3.5–5.1)
Sodium: 140 mmol/L (ref 135–145)

## 2017-09-18 LAB — CBC
HEMATOCRIT: 33.9 % — AB (ref 36.0–46.0)
Hemoglobin: 10.1 g/dL — ABNORMAL LOW (ref 12.0–15.0)
MCH: 28.2 pg (ref 26.0–34.0)
MCHC: 29.8 g/dL — AB (ref 30.0–36.0)
MCV: 94.7 fL (ref 78.0–100.0)
Platelets: 306 10*3/uL (ref 150–400)
RBC: 3.58 MIL/uL — ABNORMAL LOW (ref 3.87–5.11)
RDW: 14.6 % (ref 11.5–15.5)
WBC: 9.4 10*3/uL (ref 4.0–10.5)

## 2017-09-18 LAB — CULTURE, BLOOD (ROUTINE X 2)
Culture: NO GROWTH
Special Requests: ADEQUATE

## 2017-09-18 MED ORDER — VANCOMYCIN 50 MG/ML ORAL SOLUTION
125.0000 mg | Freq: Four times a day (QID) | ORAL | Status: DC
  Administered 2017-09-19 – 2017-09-21 (×11): 125 mg via ORAL
  Filled 2017-09-18 (×19): qty 2.5

## 2017-09-18 MED ORDER — METOPROLOL TARTRATE 25 MG PO TABS
25.0000 mg | ORAL_TABLET | Freq: Two times a day (BID) | ORAL | Status: DC
  Administered 2017-09-18 – 2017-09-19 (×3): 25 mg via ORAL
  Filled 2017-09-18 (×3): qty 1

## 2017-09-18 NOTE — Progress Notes (Signed)
PROGRESS NOTE  Andrea [name] MNO:[id number] DOB: 1928/10/10 DOA: 09/13/2017 PCP: Unk Pinto, MD  Brief History: 83 year old female with a history of bronchiectasis/COPD, chronic respiratory failure on 2 L at night, hypertension, hyperlipidemia, diastolic CHF, hypothyroidism, and PAT/PAFpresented with 4-5-day history of diarrhea and 1 day history of nausea and vomiting. The patient was recently discharged from the hospital after a stay from August 26, 2017 through September 01, 2017. During that hospital stay, the patient was treated for acute on chronic diastolic CHF as well as finishing treatment for C. difficile colitis with metronidazole.The last day of metronidazole was August 30, 2017. During that hospitalization, the patient also underwent a left-sided thoracocentesis. Fluid studies revealed that it was transudate of. The patient was discharged home with furosemide 20 mg daily. Unfortunately, the patient began having diarrhea 4-5 days prior to this admission. The patient went to see her primary care provider and C. difficile assay was positive again. The patient was restarted on metronidazole. However,the patient states that she was not taking it consistently. In addition, the patient intermittently does not take her cardiac medications including but not limited to metoprolol, diltiazem, and furosemide. The patient complains of dyspnea on exertion, but states that this is not much worse than usual. In addition, the patient states that her lower extremity edema is about the same as usual. She denies any hematochezia, hematemesis, fevers, chills, chest pain, headache, neck pain. Upon presentation, CT of the abdomen and pelvis revealed moderate bilateral pleural effusions, left greater than right. There was also a dilated fluid-filled small bowel loops up to 5.3 cm with a transition point in the mid to distal ileum concerning for bowel obstruction. Chest x-ray revealed  increased interstitial prominence with bilateral pleural effusions.    Assessment/Plan: Acute on chronic diastolic CHF -August 26, 2017 echo EF 60-65%, grade 2 DD, moderate TR, PASP 61 -daily weights--question accuracy -accurate I/O's--not accurate -fluid restrict -ContinueIV Lasix--decreased to once daily on 12/20 due to worsen renal function -cardiology consult appreciated -repeat CXR 12/20 personally reviewed, slight worsen pleural effusions and pulm edema -12/20--Left Thoracocentesis--> 1.3L, transudate -12/21--Right Thoracocentesis--1L, transudate -repeat CXR 12/23 shows stable cardiomegaly and bilateral pulmonary edema and pleural effusions.  Clinically, she is better  Bilateral Pleural Effusions -transudate by fluid analysis -08/29/17--Left Thora--1L -09/15/17--Left Thora--1.3L -09/16/17--R-Thora--1L  Small bowel obstruction -Likely secondary to C. difficile colitis the patient also had a small bowel obstruction July 2018 secondary to adhesions. -Appreciate surgical consultation-->nonoperative management for now -she is a poor surgical candidate -repeat abd xray--personally reviewed--No Air Fluid Levels, minimal SB dilatation -changed vancomycin to po 12/21 as bowel function returning  -Tolerating solid diet, passing flatus and still having bowel movements.  Recurrent C. difficile colitis--Severe -She is being treated with vancomycin enemas, p.o. vancomycin as well as IV metronidazole -Since overall bowel function has improved and she is able to tolerate p.o., will discontinue vancomycin enemas as well as metronidazole. -Continue on oral vancomycin, will likely need prolonged taper since this is a recurrent episode.  Atrial fibrillation with RVR/Atrial tachycardia -Briefly treated with Cardizem infusion, now on oral Cardizem -Heart rate remains uncontrolled, with heart rates ranging in the 110s-130s. -Metoprolol changed from IV to p.o.  today. -TSH--4.511 -Anticoagulated with Eliquis  Acute on chronic respiratory failure with hypoxia -Secondary to CHFin the setting of bronchiectasis -09/14/17 evening-->increased oxygen demand to 12 L HFNC -12/22-->3L -Normally on 2 L nasal cannula at home -Wean oxygen back to 2 L for oxygen saturation  greater than 92%  Acute on chronic renal failure--CKD3 -baseline creatinine 0.8-1.1 -serum creatinine peaked 1.80 -12/20--Lasix IV decreased to once daily -Renal function appears to be stable/improving  COPD/bronchiectasis/chronic respiratory failure with hypoxia -On 2 L nasal cannula atnighttime at home -continueduonebs -pulmonary consult appreciated  Essential hypertension -Holdingoral metoprolol and diltiazeminitially due to SBO -hydralazine IV prn SBP >180  Anxiety -IV ativan q 8 hrs prn until able to take po xanax  Severe protein calorie malnutrition -Consult nutrition  Goals of Care -family has unrealistic expectations--becomes angry and wants to dismiss physicians/care team members when given any type of non-positive clinical information of any kind -they have dismissed the palliative medicine consultant -remains FULL CODE   Disposition Plan:transfer to tele Family Communication:No family at bedside   Consultants:General surgery; palliative med, cardiology, pulmonology  Code Status: FULL   DVT Prophylaxis: Eliquis   Procedures: As Listed in Progress Note Above  Antibiotics: Vanco po/Enema 12/19>>>       Subjective: She feels that her breathing is better today.  Does not have any significant cough.  No chest pain.  Objective: Vitals:   09/18/17 0500 09/18/17 0905 09/18/17 1314 09/18/17 1451  BP: 130/80  133/84   Pulse: (!) 120  97   Resp: 16  16   Temp: 98.2 F (36.8 C)  97.8 F (36.6 C)   TempSrc: Oral  Oral   SpO2: 93% 91% 94% 91%  Weight:      Height:        Intake/Output Summary (Last 24 hours) at  09/18/2017 1811 Last data filed at 09/18/2017 1319 Gross per 24 hour  Intake 900 ml  Output -  Net 900 ml   Weight change:  Exam:   General:  Pt is alert, follows commands appropriately, not in acute distress  HEENT: No icterus, No thrush, No neck mass, [place]/AT  Cardiovascular: IRRR, S1/S2, no rubs, no gallops  Respiratory: Diminished breath sounds at bases with crackles at bases bilaterally  Abdomen: Soft/+BS, non tender, non distended, no guarding  Extremities: 1+LE edema, No lymphangitis, No petechiae, No rashes, no synovitis   Data Reviewed: I have personally reviewed following labs and imaging studies Basic Metabolic Panel: Recent Labs  Lab 09/14/17 0404 09/15/17 0407 09/16/17 0359 09/17/17 0438 09/18/17 0610  NA 142 140 139 140 140  K 4.3 4.1 4.0 4.2 3.4*  CL 100* 99* 99* 99* 97*  CO2 _0 GLUCOSE 117* 111* 147* 107* 115*  BUN 29* 41* 43* 37* 28*  CREATININE 1.17* 1.80* 1.60* 1.20* 1.02*  CALCIUM 8.1* 8.1* 8.2* 8.0* 8.2*  MG  --  1.8  --   --   --    Liver Function Tests: Recent Labs  Lab 09/13/17 1638 09/14/17 0404 09/16/17 0359  AST _1 ALT _2 ALKPHOS 70 54 59  BILITOT 0.8 0.6 0.4  PROT 7.8 6.4* 6.4*  ALBUMIN 3.2* 2.5* 2.5*   No results for input(s): LIPASE, AMYLASE in the last 168 hours. No results for input(s): AMMONIA in the last 168 hours. Coagulation Profile: No results for input(s): INR, PROTIME in the last 168 hours. CBC: Recent Labs  Lab 09/13/17 1638 09/14/17 0404 09/15/17 0407 09/16/17 0359 09/17/17 0438 09/18/17 0610  WBC 17.1* 13.1* 13.9* 11.9* 10.7* 9.4  NEUTROABS 14.4*  --   --   --   --   --   HGB 11.8* 10.4* 10.6* 10.3* 9.3* 10.1*  HCT 40.6 36.1 37.4 36.0 31.3* 33.9*  MCV 94.9 97.6 99.5 97.3 94.8 94.7  PLT 385 352 359 274 280 306   Cardiac Enzymes: Recent Labs  Lab 09/13/17 1638  TROPONINI <0.03   BNP: Invalid input(s): POCBNP CBG: No results for input(s): GLUCAP in the last 168  hours. HbA1C: No results for input(s): HGBA1C in the last 72 hours. Urine analysis:    Component Value Date/Time   COLORURINE YELLOW 09/13/2017 [place] 09/13/2017 1647   LABSPEC 1.010 09/13/2017 1647   PHURINE 6.0 09/13/2017 1647   GLUCOSEU NEGATIVE 09/13/2017 1647   HGBUR NEGATIVE 09/13/2017 1647   BILIRUBINUR NEGATIVE 09/13/2017 1647   KETONESUR NEGATIVE 09/13/2017 1647   PROTEINUR 30 (A) 09/13/2017 1647   UROBILINOGEN 0.2 08/20/2014 1613   NITRITE NEGATIVE 09/13/2017 1647   LEUKOCYTESUR TRACE (A) 09/13/2017 1647   Sepsis Labs: _0 (procalcitonin:4,lacticidven:4) ) Recent Results (from the past 240 hour(s))  Blood culture (routine x 2)     Status: None   Collection Time: 09/13/17  4:38 PM  Result Value Ref Range Status   Specimen Description BLOOD RIGHT FOREARM  Final   Special Requests   Final    BOTTLES DRAWN AEROBIC AND ANAEROBIC Blood Culture adequate volume   Culture NO GROWTH 5 DAYS  Final   Report Status 09/18/2017 FINAL  Final  Blood culture (routine x 2)     Status: None   Collection Time: 09/13/17  4:50 PM  Result Value Ref Range Status   Specimen Description LEFT ANTECUBITAL  Final   Special Requests   Final    BOTTLES DRAWN AEROBIC AND ANAEROBIC Blood Culture adequate volume   Culture NO GROWTH 5 DAYS  Final   Report Status 09/18/2017 FINAL  Final  MRSA PCR Screening     Status: None   Collection Time: 09/14/17  2:20 PM  Result Value Ref Range Status   MRSA by PCR NEGATIVE NEGATIVE Final    Comment:        The GeneXpert MRSA Assay (FDA approved for NASAL specimens only), is one component of a comprehensive MRSA colonization surveillance program. It is not intended to diagnose MRSA infection nor to guide or monitor treatment for MRSA infections.   Gram stain     Status: None   Collection Time: 09/15/17  3:30 PM  Result Value Ref Range Status   Specimen Description PLEURAL  Final   Special Requests NONE  Final   Gram Stain    Final    Performed at [facility] PMN AND MONONUCLEAR NO ORGANISMS SEEN    Report Status 09/15/2017 FINAL  Final  Culture, body fluid-bottle     Status: None (Preliminary result)   Collection Time: 09/15/17  4:00 PM  Result Value Ref Range Status   Specimen Description ASCITIC  Final   Special Requests   Final    BOTTLES DRAWN AEROBIC AND ANAEROBIC Blood Culture adequate volume   Culture NO GROWTH 3 DAYS  Final   Report Status PENDING  Incomplete     Scheduled Meds: . apixaban  2.5 mg Oral BID  . diltiazem  60 mg Oral Q6H  . furosemide  40 mg Intravenous Daily  . hydrocerin   Topical BID  . ipratropium  0.5 mg Nebulization TID  . levalbuterol  0.63 mg Nebulization TID  . levothyroxine  25 mcg Intravenous QAC breakfast  . mouth rinse  15 mL Mouth Rinse BID  . metoprolol tartrate  25 mg Oral BID  . pantoprazole (PROTONIX) IV  40 mg Intravenous Q24H  . [START ON 09/19/2017] vancomycin  125 mg Oral Q6H   Continuous Infusions:   Procedures/Studies: Dg Chest 1 View  Result Date: 08/29/2017 INDICATION: Pleural effusions resistant to diuresis. EXAM: ULTRASOUND GUIDED LEFT THORACENTESIS MEDICATIONS: None. COMPLICATIONS: None immediate. PROCEDURE: An ultrasound guided thoracentesis was thoroughly discussed with the patient and questions answered. The benefits, risks, alternatives and complications were also discussed. The patient is at increased bleeding risk due to Eliquis use and we had a lengthy discussion about this; the patient's husband was also called on the telephone. The patient understands and wishes to proceed with the procedure. Written consent was obtained. Ultrasound was performed to localize and mark an adequate pocket of fluid in the left chest. The area was then prepped and draped in the normal sterile fashion. 1% Lidocaine was used for local anesthesia. Under ultrasound guidance a 19 gauge, 7-cm, Yueh catheter was introduced.  Thoracentesis was performed until the available fluid was drained. I scanned the left chest and there was no evidence of bleeding. The catheter was removed and a dressing applied. FINDINGS: A total of approximately 1 L of amber fluid was removed. Samples were sent to the laboratory as requested by the clinical team. A chest x-ray was obtained and shows significant decrease in left pleural effusion, with small volume fluid remaining. No visible pneumothorax. There is a moderate right pleural effusion. Cardiomegaly and vascular congestion. IMPRESSION: 1. Successful ultrasound guided left thoracentesis yielding 1 L of pleural fluid. 2. Minimal fluid remaining on the left. Right pleural effusion is moderate. Electronically Signed   By: Monte Fantasia M.D.   On: 08/29/2017 11:35   Dg Chest 2 View  Result Date: 09/01/2017 CLINICAL DATA:  Pleural effusions EXAM: CHEST  2 VIEW COMPARISON:  08/30/2017 CXR FINDINGS: Stable cardiac silhouette partially obscured by bilateral pleural effusions. Aortic atherosclerosis is noted. Decrease in size of small right pleural effusion with slight increase in left-sided pleural effusion. Adjacent bibasilar atelectasis. Interval decrease in pulmonary vascular redistribution. Osteoarthritis of the [facility] and glenohumeral joint on the right with high-riding right humeral head and remodeling of the undersurface of the acromion. Findings likely reflect chronic rotator cuff tears. IMPRESSION: 1. Slight decrease in right small pleural effusion and minimal increase in small left pleural effusion since prior. 2. Interval decrease in CHF. 3. Aortic atherosclerosis. Electronically Signed   By: Ashley Royalty M.D.   On: 09/01/2017 14:28   Dg Chest 2 View  Result Date: 08/26/2017 CLINICAL DATA:  Patient having increased SOB over the past few days. Patient is on oxygen at home and states that she usually only wears it at night but has been wearing it all day since having SOB. Hx of pna,  htn-controlled with medication, COPD. Ex-smoker. EXAM: CHEST  2 VIEW COMPARISON:  06/28/2017 FINDINGS: The lungs are hyperinflated likely secondary to COPD. There are moderate bilateral pleural effusions with bibasilar atelectasis. There is bilateral diffuse interstitial thickening. There is no pneumothorax. Stable cardiomediastinal silhouette. There is no acute osseous abnormality. There is severe osteoarthritis of the right glenohumeral joint. There is mild osteoarthritis of the left glenohumeral joint. IMPRESSION: 1. Findings consistent with CHF. Electronically Signed   By: Kathreen Devoid   On: 08/26/2017 13:36   Ct Abdomen Pelvis W Contrast  Result Date: 09/13/2017 CLINICAL DATA:  Abdominal distension with vomiting and diarrhea. Shortness of breath. EXAM: CT ABDOMEN AND PELVIS WITH CONTRAST TECHNIQUE: Multidetector CT imaging of the abdomen and pelvis was performed using the standard protocol following  bolus administration of intravenous contrast. CONTRAST:  151m ISOVUE-300 IOPAMIDOL (ISOVUE-300) INJECTION 61% COMPARISON:  08/19/2017 FINDINGS: Lower chest: Emphysema. Persistent bibasilar airspace disease. Mild cardiomegaly with LAD coronary artery atherosclerosis. Moderate bilateral pleural effusions, larger on the left. Similar. Hepatobiliary: Perfusion anomaly involving the anterior left hepatic lobe image 33/ series 2. Normal gallbladder, without biliary ductal dilatation. Pancreas: Normal, without mass or ductal dilatation. Spleen: Normal in size, without focal abnormality. Adrenals/Urinary Tract: Normal right adrenal gland. Minimal left adrenal thickening and nodularity. Bilateral renal cortical thinning. Right renal lesions are favored to represent cysts or minimally complex cysts. Bilateral too small to characterize renal lesions. No hydronephrosis. Normal urinary bladder. Stomach/Bowel: Normal stomach, without wall thickening. Scattered colonic diverticula. The colon is relatively decompressed. Small  bowel loops are fluid-filled and dilated, including at up to 5.3 cm. A transition point is identified within the mid to distal ileum, including on images 73 through 77/series 2. No bowel wall thickening to suggest complicating ischemia. No pneumatosis or free intraperitoneal air. Vascular/Lymphatic: Aortic and branch vessel atherosclerosis. No abdominopelvic adenopathy. Reproductive: Hysterectomy. Other: Small volume cul-de-sac fluid including on 77/series 2. Pelvic floor laxity. Musculoskeletal: Osteopenia.  Convex right lumbar spine curvature. IMPRESSION: 1. High-grade partial small bowel obstruction involving the mid to distal ileum. Likely due to adhesions. Although there is adjacent ascites, no specific evidence of complicating ischemia identified. 2. Bilateral pleural effusions and bibasilar atelectasis, as before. 3. Coronary artery atherosclerosis. Aortic Atherosclerosis (ICD10-I70.0). 4. Pelvic floor laxity. Electronically Signed   By: KAbigail MiyamotoM.D.   On: 09/13/2017 20:47   Ct Abdomen Pelvis W Contrast  Result Date: 08/19/2017 CLINICAL DATA:  Diarrhea for 1 week. EXAM: CT ABDOMEN AND PELVIS WITH CONTRAST TECHNIQUE: Multidetector CT imaging of the abdomen and pelvis was performed using the standard protocol following bolus administration of intravenous contrast. CONTRAST:  80 ml ISOVUE-300 IOPAMIDOL (ISOVUE-300) INJECTION 61% COMPARISON:  CT abdomen and pelvis 04/08/2017. FINDINGS: Lower chest: Small to moderate bilateral pleural effusions are present, larger on the left, and increased since the prior CT. Lung bases demonstrate emphysematous disease. Heart size is enlarged. Calcific coronary artery disease is noted. Compressive atelectasis in lung bases is present Hepatobiliary: No focal liver abnormality is seen. 1 cm hypervascular lesion in the anterior left hepatic lobe is better visualized on the prior CT. No gallstones, gallbladder wall thickening, or biliary dilatation. Pancreas: Unremarkable.  No pancreatic ductal dilatation or surrounding inflammatory changes. Spleen: Normal in size without focal abnormality. Adrenals/Urinary Tract: Multiple low attenuating lesions in the right kidney are likely cysts and unchanged. No hydronephrosis. The right kidney is ptotic, unchanged. Urinary bladder appears normal. Adrenal glands are unremarkable. Stomach/Bowel: Extensive diverticulosis without diverticulitis is worst in the sigmoid. The colon otherwise appears normal. Stomach and small bowel appear normal. No evidence of appendicitis. Vascular/Lymphatic: Aortic atherosclerosis. No enlarged abdominal or pelvic lymph nodes. Reproductive: Status post hysterectomy. No adnexal masses. Other: No ascites. Musculoskeletal: Severe convex right scoliosis noted. No lytic or sclerotic lesion. No acute abnormality. IMPRESSION: No acute abnormality abdomen or pelvis. Small to moderate bilateral pleural effusions, larger on the left, increased since the prior CT. Diverticulosis without diverticulitis. Calcific aortic and coronary atherosclerosis. Electronically Signed   By: TInge RiseM.D.   On: 08/19/2017 18:46   Dg Chest Port 1 View  Result Date: 09/18/2017 CLINICAL DATA:  Bilateral pleural effusions. EXAM: PORTABLE CHEST 1 VIEW COMPARISON:  Radiographs of September 16, 2017. FINDINGS: Stable cardiomegaly and central pulmonary vascular congestion is noted. No pneumothorax is noted. Stable  diffuse interstitial densities are noted throughout both lungs concerning for pulmonary edema or inflammation. Bilateral pleural effusions are noted, with left greater than right. Probable underlying atelectasis is noted. Degenerative changes seen involving both glenohumeral joints. IMPRESSION: Stable cardiomegaly and bilateral pulmonary edema and pleural effusions is again noted. Electronically Signed   By: Marijo Conception, M.D.   On: 09/18/2017 09:08   Dg Chest Port 1 View  Result Date: 09/16/2017 CLINICAL DATA:  RIGHT  pleural effusion post thoracentesis, CHF EXAM: PORTABLE CHEST 1 VIEW COMPARISON:  Portable exam 1505 hours compared to 09/15/2017 FINDINGS: Small bibasilar pleural effusions and atelectasis. Persistent pulmonary edema increased on LEFT since prior exam. No pneumothorax following RIGHT thoracentesis. Persistent enlargement of cardiac silhouette with pulmonary vascular congestion. Atherosclerotic calcification aorta. Bones demineralized with BILATERAL chronic rotator cuff tears. IMPRESSION: No pneumothorax following RIGHT thoracentesis. Persistent pulmonary edema increased since previous exam with residual bibasilar effusions and atelectasis. Electronically Signed   By: Lavonia Dana M.D.   On: 09/16/2017 13:24   Dg Chest Port 1 View  Result Date: 09/15/2017 CLINICAL DATA:  Small bowel obstruction EXAM: PORTABLE CHEST 1 VIEW COMPARISON:  Portable exam 1324 hours compared to 09/13/2017 FINDINGS: Enlargement of cardiac silhouette with pulmonary vascular congestion. Diffuse pulmonary edema consistent with CHF. Associated bibasilar pleural effusions and atelectasis slightly greater on LEFT, appear progressive since prior study. No pneumothorax. Bones demineralized IMPRESSION: CHF with diffuse pulmonary edema, bibasilar effusions and atelectasis. BILATERAL pleural effusions and basilar atelectasis appear mildly progressive since previous exam Electronically Signed   By: Lavonia Dana M.D.   On: 09/15/2017 14:54   Dg Chest Portable 1 View  Result Date: 09/13/2017 CLINICAL DATA:  Shortness of breath.  Vomiting and diarrhea. EXAM: PORTABLE CHEST 1 VIEW COMPARISON:  09/01/2017 FINDINGS: Small to moderate bilateral pleural effusions, increased. Diffuse interstitial and airspace opacity, increased. Probable cardiomegaly, although partially obscured. No pneumothorax. IMPRESSION: CHF pattern including small to moderate pleural effusions. Electronically Signed   By: Monte Fantasia M.D.   On: 09/13/2017 17:53   Dg Chest  Port 1 View  Result Date: 08/30/2017 CLINICAL DATA:  COPD.  Pleural effusion. EXAM: PORTABLE CHEST 1 VIEW COMPARISON:  08/29/2017. FINDINGS: Cardiomegaly with pulmonary venous congestion and bilateral interstitial prominence with bilateral pleural effusions again noted. Findings consistent with CHF. No significant change from prior study of 08/29/2017. No evidence of pneumothorax status post recent thoracentesis. IMPRESSION: 1. Persistent cardiomegaly with pulmonary venous congestion, bilateral interstitial prominence and bilateral pleural effusions again noted without significant change. Findings consistent CHF. 2. No evidence of progression of pleural effusions. No pneumothorax status post recent left thoracentesis. Electronically Signed   By: Marcello Moores  Register   On: 08/30/2017 08:43   Dg Chest Port 1 View  Result Date: 08/28/2017 CLINICAL DATA:  Hypoxemia, bilateral pleural effusions EXAM: PORTABLE CHEST 1 VIEW COMPARISON:  08/27/2017 FINDINGS: Moderate to large bilateral pleural effusions. Diffuse bilateral airspace disease again noted. No real change since prior study. Cardiomegaly. IMPRESSION: Severe diffuse bilateral airspace disease and a large effusions. No real change. Electronically Signed   By: Rolm Baptise M.D.   On: 08/28/2017 10:27   Dg Chest Port 1 View  Result Date: 08/27/2017 CLINICAL DATA:  Pleural effusion. EXAM: PORTABLE CHEST 1 VIEW COMPARISON:  08/26/2017 FINDINGS: Moderate bilateral pleural effusions. Diffuse bilateral airspace disease, likely edema/ CHF. Cardiomegaly. Slight interval worsening edema since prior study. IMPRESSION: Worsening aeration of the lungs compatible with worsening edema/ CHF. Moderate bilateral effusions are stable. Electronically Signed   By: Lennette Bihari  [place] M.D.   On: 08/27/2017 13:19   Dg Chest Port 1v Same Day  Result Date: 09/15/2017 CLINICAL DATA:  CHF, BILATERAL pleural effusions post LEFT thoracentesis EXAM: PORTABLE CHEST 1 VIEW COMPARISON:   Portable exam 1615 hours compared to earlier study of at 1324 hours FINDINGS: Enlargement of cardiac silhouette with pulmonary vascular congestion. Slightly decreased pulmonary edema in LEFT lung. Decreased LEFT pleural effusion and basilar atelectasis post thoracentesis. No pneumothorax. Persistent RIGHT pulmonary edema, RIGHT pleural effusion and basilar atelectasis. Biapical scarring. Bones demineralized. IMPRESSION: No pneumothorax following LEFT thoracentesis. Electronically Signed   By: Lavonia Dana M.D.   On: 09/15/2017 16:40   Dg Abd 2 Views  Result Date: 09/15/2017 CLINICAL DATA:  Bowel obstruction EXAM: ABDOMEN - 2 VIEW COMPARISON:  September 14, 2017 FINDINGS: Supine and upright images were obtained. There are loops of mildly dilated small bowel in the lower abdomen and pelvis, similar to 1 day prior. There are foci of contrast in the colon. There are scattered colonic diverticula. No evident free air. There is contrast in the urinary bladder. There is lumbar scoliosis. IMPRESSION: Contrast is seen in the colon. There are scattered colonic diverticula. Mild dilatation of several small bowel loops, similar to 1 day prior. No air-fluid levels appreciable. No free air. Electronically Signed   By: Lowella Grip III M.D.   On: 09/15/2017 22:59   Dg Abd 2 Views  Result Date: 09/14/2017 CLINICAL DATA:  Small-bowel obstruction EXAM: ABDOMEN - 2 VIEW COMPARISON:  CT abdomen 09/13/2017 FINDINGS: Dilated small bowel loops in the pelvis similar to improved from the prior study. Contrast in the renal collecting system bilaterally from recent CT. No renal obstruction. Moderate lumbar scoliosis.  No acute skeletal abnormality. IMPRESSION: Small bowel dilatation similar to slightly improved from yesterday. Electronically Signed   By: Franchot Gallo M.D.   On: 09/14/2017 07:48   US Thoracentesis Asp Pleural Space W/img Guide  Result Date: 09/16/2017 INDICATION: CHF, BILATERAL pleural effusions, prior LEFT  thoracentesis, for RIGHT thoracentesis TODAY EXAM: ULTRASOUND GUIDED DIAGNOSTIC AND THERAPEUTIC RIGHT THORACENTESIS MEDICATIONS: None. COMPLICATIONS: None immediate. PROCEDURE: Procedure, benefits, and risks of procedure were discussed with patient. Written informed consent for procedure was obtained. Time out protocol followed. Pleural effusion localized by ultrasound at the posterior RIGHT hemithorax. Skin prepped and draped in usual sterile fashion. Skin and soft tissues anesthetized with 10 mL of 1% lidocaine. 8 French thoracentesis catheter placed into the RIGHT pleural space. 1 L of amber colored RIGHT pleural fluid aspirated by vacuum bottle. Procedure tolerated well by patient without immediate complication. FINDINGS: A total of approximately 1 L of RIGHT pleural fluid was removed. Samples were sent to the laboratory as requested by the clinical team. IMPRESSION: Successful ultrasound guided RIGHT thoracentesis yielding 1 of pleural fluid. Electronically Signed   By: Lavonia Dana M.D.   On: 09/16/2017 13:28   US Thoracentesis Asp Pleural Space W/img Guide  Result Date: 09/15/2017 INDICATION: CHF, BILATERAL pleural effusions EXAM: ULTRASOUND GUIDED DIAGNOSTIC AND THERAPEUTIC LEFT THORACENTESIS MEDICATIONS: None. COMPLICATIONS: None immediate. PROCEDURE: Procedure, benefits, and risks of procedure were discussed with patient. Written informed consent for procedure was obtained. Time out protocol followed. Pleural effusion localized by ultrasound at the posterior LEFT hemithorax. Skin prepped and draped in usual sterile fashion. Skin and soft tissues anesthetized with 10 mL of 1% lidocaine. 8 French thoracentesis catheter placed into the LEFT pleural space. 1.3 L of amber colored LEFT pleural fluid was aspirated by syringe pump. Procedure tolerated well by patient  without immediate complication. FINDINGS: A total of approximately 1.3 L of LEFT pleural fluid was removed. Samples were sent to the laboratory  as requested by the clinical team. IMPRESSION: Successful ultrasound guided LEFT thoracentesis yielding 1.3 of pleural fluid. Electronically Signed   By: Lavonia Dana M.D.   On: 09/15/2017 16:34   US Thoracentesis Asp Pleural Space W/img Guide  Result Date: 08/29/2017 INDICATION: Pleural effusions resistant to diuresis. EXAM: ULTRASOUND GUIDED LEFT THORACENTESIS MEDICATIONS: None. COMPLICATIONS: None immediate. PROCEDURE: An ultrasound guided thoracentesis was thoroughly discussed with the patient and questions answered. The benefits, risks, alternatives and complications were also discussed. The patient is at increased bleeding risk due to Eliquis use and we had a lengthy discussion about this; the patient's husband was also called on the telephone. The patient understands and wishes to proceed with the procedure. Written consent was obtained. Ultrasound was performed to localize and mark an adequate pocket of fluid in the left chest. The area was then prepped and draped in the normal sterile fashion. 1% Lidocaine was used for local anesthesia. Under ultrasound guidance a 19 gauge, 7-cm, Yueh catheter was introduced. Thoracentesis was performed until the available fluid was drained. I scanned the left chest and there was no evidence of bleeding. The catheter was removed and a dressing applied. FINDINGS: A total of approximately 1 L of amber fluid was removed. Samples were sent to the laboratory as requested by the clinical team. A chest x-ray was obtained and shows significant decrease in left pleural effusion, with small volume fluid remaining. No visible pneumothorax. There is a moderate right pleural effusion. Cardiomegaly and vascular congestion. IMPRESSION: 1. Successful ultrasound guided left thoracentesis yielding 1 L of pleural fluid. 2. Minimal fluid remaining on the left. Right pleural effusion is moderate. Electronically Signed   By: Monte Fantasia M.D.   On: 08/29/2017 11:35    Kathie Dike,  MD  [facility] Pager [phone]  If 7PM-7AM, please contact night-coverage [email or web address] Password [email or web address] 09/18/2017, 6:11 PM   LOS: 5 days

## 2017-09-18 NOTE — Progress Notes (Signed)
Patient has been comfortable in night, no complaints of pain. Patient is still on telemetry, heart rate has been erratic during shift and not stable.  Patient did have an episode where heart rate was increased, has been in a.fib all night. After administration of meds heart rate was decreased.

## 2017-09-18 NOTE — Progress Notes (Signed)
Subjective: She says she feels better.  She still has some trouble with cardiac arrhythmias last night.  Objective: Vital signs in last 24 hours: Temp:  [97.8 F (36.6 C)-98.3 F (36.8 C)] 98.2 F (36.8 C) (12/23 0500) Pulse Rate:  [84-130] 120 (12/23 0500) Resp:  [16-19] 16 (12/23 0500) BP: (126-130)/(63-80) 130/80 (12/23 0500) SpO2:  [91 %-98 %] 91 % (12/23 0905) Weight:  [55.5 kg (122 lb 4.8 oz)] 55.5 kg (122 lb 4.8 oz) (12/22 1321) Weight change:  Last BM Date: 09/17/17  Intake/Output from previous day: 12/22 0701 - 12/23 0700 In: 660 [P.O.:360; IV Piggyback:300] Out: -   PHYSICAL EXAM General appearance: alert and mild distress Resp: clear to auscultation bilaterally Cardio: irregularly irregular rhythm GI: soft, non-tender; bowel sounds normal; no masses,  no organomegaly Extremities: extremities normal, atraumatic, no cyanosis or edema Skin warm and dry  Lab Results:  Results for orders placed or performed during the hospital encounter of 09/13/17 (from the past 48 hour(s))  Lactate dehydrogenase (pleural or peritoneal fluid)     Status: Abnormal   Collection Time: 09/16/17 12:30 PM  Result Value Ref Range   LD, Fluid 62 (H) 3 - 23 U/L   Fluid Type-FLDH PLEURAL     Comment: CORRECTED ON 12/21 AT 1304: PREVIOUSLY REPORTED AS Pleural R  Protein, pleural or peritoneal fluid     Status: None   Collection Time: 09/16/17 12:30 PM  Result Value Ref Range   Total protein, fluid <3.0 g/dL   Fluid Type-FTP PLEURAL     Comment: CORRECTED ON 12/21 AT 1304: PREVIOUSLY REPORTED AS Pleural R  Basic metabolic panel     Status: Abnormal   Collection Time: 09/17/17  4:38 AM  Result Value Ref Range   Sodium 140 135 - 145 mmol/L   Potassium 4.2 3.5 - 5.1 mmol/L   Chloride 99 (L) 101 - 111 mmol/L   CO2 30 22 - 32 mmol/L   Glucose, Bld 107 (H) 65 - 99 mg/dL   BUN 37 (H) 6 - 20 mg/dL   Creatinine, Ser 1.20 (H) 0.44 - 1.00 mg/dL   Calcium 8.0 (L) 8.9 - 10.3 mg/dL   GFR calc non  Af Amer 40 (L) >60 mL/min   GFR calc Af Amer 46 (L) >60 mL/min    Comment: (NOTE) The eGFR has been calculated using the CKD EPI equation. This calculation has not been validated in all clinical situations. eGFR's persistently <60 mL/min signify possible Chronic Kidney Disease.    Anion gap 11 5 - 15  CBC     Status: Abnormal   Collection Time: 09/17/17  4:38 AM  Result Value Ref Range   WBC 10.7 (H) 4.0 - 10.5 K/uL   RBC 3.30 (L) 3.87 - 5.11 MIL/uL   Hemoglobin 9.3 (L) 12.0 - 15.0 g/dL   HCT 31.3 (L) 36.0 - 46.0 %   MCV 94.8 78.0 - 100.0 fL   MCH 28.2 26.0 - 34.0 pg   MCHC 29.7 (L) 30.0 - 36.0 g/dL   RDW 14.6 11.5 - 15.5 %   Platelets 280 150 - 400 K/uL  CBC     Status: Abnormal   Collection Time: 09/18/17  6:10 AM  Result Value Ref Range   WBC 9.4 4.0 - 10.5 K/uL   RBC 3.58 (L) 3.87 - 5.11 MIL/uL   Hemoglobin 10.1 (L) 12.0 - 15.0 g/dL   HCT 33.9 (L) 36.0 - 46.0 %   MCV 94.7 78.0 - 100.0 fL   MCH  28.2 26.0 - 34.0 pg   MCHC 29.8 (L) 30.0 - 36.0 g/dL   RDW 14.6 11.5 - 15.5 %   Platelets 306 150 - 400 K/uL  Basic metabolic panel     Status: Abnormal   Collection Time: 09/18/17  6:10 AM  Result Value Ref Range   Sodium 140 135 - 145 mmol/L   Potassium 3.4 (L) 3.5 - 5.1 mmol/L    Comment: DELTA CHECK NOTED   Chloride 97 (L) 101 - 111 mmol/L   CO2 31 22 - 32 mmol/L   Glucose, Bld 115 (H) 65 - 99 mg/dL   BUN 28 (H) 6 - 20 mg/dL   Creatinine, Ser 1.02 (H) 0.44 - 1.00 mg/dL   Calcium 8.2 (L) 8.9 - 10.3 mg/dL   GFR calc non Af Amer 48 (L) >60 mL/min   GFR calc Af Amer 56 (L) >60 mL/min    Comment: (NOTE) The eGFR has been calculated using the CKD EPI equation. This calculation has not been validated in all clinical situations. eGFR's persistently <60 mL/min signify possible Chronic Kidney Disease.    Anion gap 12 5 - 15    ABGS No results for input(s): PHART, PO2ART, TCO2, HCO3 in the last 72 hours.  Invalid input(s): PCO2 CULTURES Recent Results (from the past 240  hour(s))  Blood culture (routine x 2)     Status: None   Collection Time: 09/13/17  4:38 PM  Result Value Ref Range Status   Specimen Description BLOOD RIGHT FOREARM  Final   Special Requests   Final    BOTTLES DRAWN AEROBIC AND ANAEROBIC Blood Culture adequate volume   Culture NO GROWTH 5 DAYS  Final   Report Status 09/18/2017 FINAL  Final  Blood culture (routine x 2)     Status: None   Collection Time: 09/13/17  4:50 PM  Result Value Ref Range Status   Specimen Description LEFT ANTECUBITAL  Final   Special Requests   Final    BOTTLES DRAWN AEROBIC AND ANAEROBIC Blood Culture adequate volume   Culture NO GROWTH 5 DAYS  Final   Report Status 09/18/2017 FINAL  Final  MRSA PCR Screening     Status: None   Collection Time: 09/14/17  2:20 PM  Result Value Ref Range Status   MRSA by PCR NEGATIVE NEGATIVE Final    Comment:        The GeneXpert MRSA Assay (FDA approved for NASAL specimens only), is one component of a comprehensive MRSA colonization surveillance program. It is not intended to diagnose MRSA infection nor to guide or monitor treatment for MRSA infections.   Gram stain     Status: None   Collection Time: 09/15/17  3:30 PM  Result Value Ref Range Status   Specimen Description PLEURAL  Final   Special Requests NONE  Final   Gram Stain   Final    Performed at [facility] PMN AND MONONUCLEAR NO ORGANISMS SEEN    Report Status 09/15/2017 FINAL  Final  Culture, body fluid-bottle     Status: None (Preliminary result)   Collection Time: 09/15/17  4:00 PM  Result Value Ref Range Status   Specimen Description ASCITIC  Final   Special Requests   Final    BOTTLES DRAWN AEROBIC AND ANAEROBIC Blood Culture adequate volume   Culture NO GROWTH 3 DAYS  Final   Report Status PENDING  Incomplete   Studies/Results: Dg Chest Port 1 View  Result Date: 09/18/2017 CLINICAL  DATA:  Bilateral pleural effusions. EXAM: PORTABLE CHEST 1 VIEW  COMPARISON:  Radiographs of September 16, 2017. FINDINGS: Stable cardiomegaly and central pulmonary vascular congestion is noted. No pneumothorax is noted. Stable diffuse interstitial densities are noted throughout both lungs concerning for pulmonary edema or inflammation. Bilateral pleural effusions are noted, with left greater than right. Probable underlying atelectasis is noted. Degenerative changes seen involving both glenohumeral joints. IMPRESSION: Stable cardiomegaly and bilateral pulmonary edema and pleural effusions is again noted. Electronically Signed   By: Marijo Conception, M.D.   On: 09/18/2017 09:08   Dg Chest Port 1 View  Result Date: 09/16/2017 CLINICAL DATA:  RIGHT pleural effusion post thoracentesis, CHF EXAM: PORTABLE CHEST 1 VIEW COMPARISON:  Portable exam 8295 hours compared to 09/15/2017 FINDINGS: Small bibasilar pleural effusions and atelectasis. Persistent pulmonary edema increased on LEFT since prior exam. No pneumothorax following RIGHT thoracentesis. Persistent enlargement of cardiac silhouette with pulmonary vascular congestion. Atherosclerotic calcification aorta. Bones demineralized with BILATERAL chronic rotator cuff tears. IMPRESSION: No pneumothorax following RIGHT thoracentesis. Persistent pulmonary edema increased since previous exam with residual bibasilar effusions and atelectasis. Electronically Signed   By: Lavonia Dana M.D.   On: 09/16/2017 13:24   US Thoracentesis Asp Pleural Space W/img Guide  Result Date: 09/16/2017 INDICATION: CHF, BILATERAL pleural effusions, prior LEFT thoracentesis, for RIGHT thoracentesis TODAY EXAM: ULTRASOUND GUIDED DIAGNOSTIC AND THERAPEUTIC RIGHT THORACENTESIS MEDICATIONS: None. COMPLICATIONS: None immediate. PROCEDURE: Procedure, benefits, and risks of procedure were discussed with patient. Written informed consent for procedure was obtained. Time out protocol followed. Pleural effusion localized by ultrasound at the posterior RIGHT  hemithorax. Skin prepped and draped in usual sterile fashion. Skin and soft tissues anesthetized with 10 mL of 1% lidocaine. 8 French thoracentesis catheter placed into the RIGHT pleural space. 1 L of amber colored RIGHT pleural fluid aspirated by vacuum bottle. Procedure tolerated well by patient without immediate complication. FINDINGS: A total of approximately 1 L of RIGHT pleural fluid was removed. Samples were sent to the laboratory as requested by the clinical team. IMPRESSION: Successful ultrasound guided RIGHT thoracentesis yielding 1 of pleural fluid. Electronically Signed   By: Lavonia Dana M.D.   On: 09/16/2017 13:28    Medications:  Prior to Admission:  Medications Prior to Admission  Medication Sig Dispense Refill Last Dose  . ALPRAZolam (XANAX) 0.25 MG tablet Take 0.125-0.25 mg by mouth at bedtime.    09/12/2017 at Unknown time  . apixaban (ELIQUIS) 2.5 MG TABS tablet Take 1 tablet (2.5 mg total) by mouth 2 (two) times daily. 180 tablet 3 09/13/2017 at 1100a  . diltiazem (CARDIZEM) 60 MG tablet Take 60 mg by mouth 4 (four) times daily.    09/13/2017 at Unknown time  . furosemide (LASIX) 20 MG tablet Take 1 tablet (20 mg total) by mouth daily. 30 tablet 0 09/13/2017 at Unknown time  . levothyroxine (SYNTHROID, LEVOTHROID) 50 MCG tablet TAKE ONE TABLET BY MOUTH ONCE A DAY AS DIRECTED 90 tablet 1 09/13/2017 at Unknown time  . Metoprolol Tartrate 37.5 MG TABS Take 37.5 mg by mouth 2 (two) times daily. 180 tablet 3 09/13/2017 at 1100a  . Probiotic Product (PROBIOTIC-10) CHEW Chew 2 tablets by mouth daily.   Past Week at Unknown time  . metroNIDAZOLE (FLAGYL) 500 MG tablet Take 1 tablet 3 x / day after meals for infection (Patient not taking: Reported on 09/13/2017) 42 tablet 0 Not Taking at Unknown time  . silver sulfADIAZINE (SILVADENE) 1 % cream Apply to affected area daily (Patient  not taking: Reported on 09/13/2017) 50 g 1 Completed Course at Unknown time  . traMADol (ULTRAM) 50 MG tablet  Take 1 tablet (50 mg total) by mouth every 6 (six) hours as needed. 20 tablet 0 UNKNOWN   Scheduled: . apixaban  2.5 mg Oral BID  . diltiazem  60 mg Oral Q6H  . furosemide  40 mg Intravenous Daily  . hydrocerin   Topical BID  . ipratropium  0.5 mg Nebulization TID  . levalbuterol  0.63 mg Nebulization TID  . levothyroxine  25 mcg Intravenous QAC breakfast  . mouth rinse  15 mL Mouth Rinse BID  . metoprolol tartrate  5 mg Intravenous Q6H  . pantoprazole (PROTONIX) IV  40 mg Intravenous Q24H  . vancomycin  500 mg Oral Q6H   Continuous: . metronidazole Stopped (09/18/17 0956)   VOZ:[id number] **OR** acetaminophen, ALPRAZolam, LORazepam, ondansetron **OR** ondansetron (ZOFRAN) IV  Assesment: She was admitted with multiple problems including C. difficile colitis and a small bowel obstruction.  She did not require surgery.  She developed increasing problems with heart failure and increasing problems with acute on chronic hypoxic respiratory failure and bilateral pleural effusions.  She has improved.  She has diuresed.  She is not having much diarrhea now.  She has had some issues with atrial fib and had some elevated heart rate last night.  She has had bilateral thoracentesis and her breathing is much better. Active Problems:   Bilateral pleural effusion   Acute on chronic respiratory failure with hypoxia (HCC)   AF (paroxysmal atrial fibrillation) (HCC)   Protein-calorie malnutrition, severe (HCC)   Acute on chronic diastolic CHF (congestive heart failure) (HCC)   SBO (small bowel obstruction) (HCC)   C. difficile colitis   Bronchiectasis ([place])   Congestive heart failure ([place])   Palliative care encounter   Goals of care, counseling/discussion   DNR (do not resuscitate) discussion   Recurrent left pleural effusion   S/P thoracentesis    Plan: Continue treatments.    LOS: 5 days   Andrea [name] L 09/18/2017, 10:37 AM

## 2017-09-19 ENCOUNTER — Inpatient Hospital Stay (HOSPITAL_COMMUNITY): Payer: Medicare Other

## 2017-09-19 LAB — BASIC METABOLIC PANEL
Anion gap: 10 (ref 5–15)
BUN: 21 mg/dL — AB (ref 6–20)
CALCIUM: 8.4 mg/dL — AB (ref 8.9–10.3)
CO2: 36 mmol/L — ABNORMAL HIGH (ref 22–32)
CREATININE: 0.91 mg/dL (ref 0.44–1.00)
Chloride: 97 mmol/L — ABNORMAL LOW (ref 101–111)
GFR calc Af Amer: >60 mL/min (ref >60)
GFR, EST NON AFRICAN AMERICAN: 56 mL/min — AB (ref >60)
GLUCOSE: 112 mg/dL — AB (ref 65–99)
POTASSIUM: 3.7 mmol/L (ref 3.5–5.1)
Sodium: 143 mmol/L (ref 135–145)

## 2017-09-19 LAB — CULTURE, BLOOD (ROUTINE X 2)
Culture: NO GROWTH
SPECIAL REQUESTS: ADEQUATE

## 2017-09-19 MED ORDER — PANTOPRAZOLE SODIUM 40 MG PO TBEC
40.0000 mg | DELAYED_RELEASE_TABLET | Freq: Every day | ORAL | Status: DC
  Administered 2017-09-20 – 2017-09-21 (×2): 40 mg via ORAL
  Filled 2017-09-19 (×2): qty 1

## 2017-09-19 MED ORDER — FUROSEMIDE 10 MG/ML IJ SOLN
20.0000 mg | Freq: Once | INTRAMUSCULAR | Status: AC
  Administered 2017-09-19: 20 mg via INTRAVENOUS
  Filled 2017-09-19: qty 2

## 2017-09-19 MED ORDER — LEVOTHYROXINE SODIUM 50 MCG PO TABS
50.0000 ug | ORAL_TABLET | Freq: Every day | ORAL | Status: DC
  Administered 2017-09-20 – 2017-09-21 (×2): 50 ug via ORAL
  Filled 2017-09-19 (×2): qty 1

## 2017-09-19 MED ORDER — METOPROLOL TARTRATE 50 MG PO TABS
50.0000 mg | ORAL_TABLET | Freq: Two times a day (BID) | ORAL | Status: DC
  Administered 2017-09-19 – 2017-09-21 (×4): 50 mg via ORAL
  Filled 2017-09-19 (×4): qty 1

## 2017-09-19 MED ORDER — NYSTATIN 100000 UNIT/ML MT SUSP
5.0000 mL | Freq: Four times a day (QID) | OROMUCOSAL | Status: DC
  Administered 2017-09-19 – 2017-09-21 (×8): 500000 [IU] via ORAL
  Filled 2017-09-19 (×8): qty 5

## 2017-09-19 NOTE — Progress Notes (Signed)
Patient c/o tongue and roof of mouth hurt, thrush noted when looked with a pen light. MD notified.

## 2017-09-19 NOTE — Progress Notes (Signed)
Subjective: She says she feels okay.  She has multiple medical problems including COPD with bronchiectasis, chronic hypoxic respiratory failure, hypertension, hyperlipidemia, diastolic heart failure, hypothyroidism, paroxysmal atrial fib with current A. fib with rapid ventricular response C. difficile colitis, bilateral pleural effusions.  She has had thoracentesis done which has helped.  There was concern that she might have small bowel obstruction but that appears to have resolved.  She says she feels much better.  She is not having any chest pain.  She is requiring more oxygen than she has at home.  She has had thoracentesis bilaterally with removal of approximately 1.2 L on both sides and that has helped her significantly.  She says she is not having diarrhea now  Objective: Vital signs in last 24 hours: Temp:  [97.8 F (36.6 C)-98.2 F (36.8 C)] 98.2 F (36.8 C) (12/24 0658) Pulse Rate:  [88-114] 110 (12/24 0658) Resp:  [16] 16 (12/24 0658) BP: (133-147)/(73-84) 139/83 (12/24 0658) SpO2:  [90 %-94 %] 91 % (12/24 0806) Weight change:  Last BM Date: 09/17/17  Intake/Output from previous day: 12/23 0701 - 12/24 0700 In: 600 [P.O.:600] Out: 150 [Urine:150]  PHYSICAL EXAM General appearance: alert, cooperative and no distress Resp: clear to auscultation bilaterally Cardio: Her heart is irregularly irregular with systolic murmur but I do not hear a gallop GI: Mildly protuberant with normal bowel sounds Extremities: She has had surgery on her right leg and this seems to be healing Skin warm and dry  Lab Results:  Results for orders placed or performed during the hospital encounter of 09/13/17 (from the past 48 hour(s))  CBC     Status: Abnormal   Collection Time: 09/18/17  6:10 AM  Result Value Ref Range   WBC 9.4 4.0 - 10.5 K/uL   RBC 3.58 (L) 3.87 - 5.11 MIL/uL   Hemoglobin 10.1 (L) 12.0 - 15.0 g/dL   HCT 33.9 (L) 36.0 - 46.0 %   MCV 94.7 78.0 - 100.0 fL   MCH 28.2 26.0 - 34.0  pg   MCHC 29.8 (L) 30.0 - 36.0 g/dL   RDW 14.6 11.5 - 15.5 %   Platelets 306 150 - 400 K/uL  Basic metabolic panel     Status: Abnormal   Collection Time: 09/18/17  6:10 AM  Result Value Ref Range   Sodium 140 135 - 145 mmol/L   Potassium 3.4 (L) 3.5 - 5.1 mmol/L    Comment: DELTA CHECK NOTED   Chloride 97 (L) 101 - 111 mmol/L   CO2 31 22 - 32 mmol/L   Glucose, Bld 115 (H) 65 - 99 mg/dL   BUN 28 (H) 6 - 20 mg/dL   Creatinine, Ser 1.02 (H) 0.44 - 1.00 mg/dL   Calcium 8.2 (L) 8.9 - 10.3 mg/dL   GFR calc non Af Amer 48 (L) >60 mL/min   GFR calc Af Amer 56 (L) >60 mL/min    Comment: (NOTE) The eGFR has been calculated using the CKD EPI equation. This calculation has not been validated in all clinical situations. eGFR's persistently <60 mL/min signify possible Chronic Kidney Disease.    Anion gap 12 5 - 15  Basic metabolic panel     Status: Abnormal   Collection Time: 09/19/17  5:40 AM  Result Value Ref Range   Sodium 143 135 - 145 mmol/L   Potassium 3.7 3.5 - 5.1 mmol/L   Chloride 97 (L) 101 - 111 mmol/L   CO2 36 (H) 22 - 32 mmol/L   Glucose,  Bld 112 (H) 65 - 99 mg/dL   BUN 21 (H) 6 - 20 mg/dL   Creatinine, Ser 0.91 0.44 - 1.00 mg/dL   Calcium 8.4 (L) 8.9 - 10.3 mg/dL   GFR calc non Af Amer 56 (L) >60 mL/min   GFR calc Af Amer >60 >60 mL/min    Comment: (NOTE) The eGFR has been calculated using the CKD EPI equation. This calculation has not been validated in all clinical situations. eGFR's persistently <60 mL/min signify possible Chronic Kidney Disease.    Anion gap 10 5 - 15    ABGS No results for input(s): PHART, PO2ART, TCO2, HCO3 in the last 72 hours.  Invalid input(s): PCO2 CULTURES Recent Results (from the past 240 hour(s))  Blood culture (routine x 2)     Status: None   Collection Time: 09/13/17  4:38 PM  Result Value Ref Range Status   Specimen Description BLOOD RIGHT FOREARM  Final   Special Requests   Final    BOTTLES DRAWN AEROBIC AND ANAEROBIC Blood  Culture adequate volume   Culture NO GROWTH 5 DAYS  Final   Report Status 09/18/2017 FINAL  Final  Blood culture (routine x 2)     Status: None   Collection Time: 09/13/17  4:50 PM  Result Value Ref Range Status   Specimen Description LEFT ANTECUBITAL  Final   Special Requests   Final    BOTTLES DRAWN AEROBIC AND ANAEROBIC Blood Culture adequate volume   Culture NO GROWTH 5 DAYS  Final   Report Status 09/18/2017 FINAL  Final  MRSA PCR Screening     Status: None   Collection Time: 09/14/17  2:20 PM  Result Value Ref Range Status   MRSA by PCR NEGATIVE NEGATIVE Final    Comment:        The GeneXpert MRSA Assay (FDA approved for NASAL specimens only), is one component of a comprehensive MRSA colonization surveillance program. It is not intended to diagnose MRSA infection nor to guide or monitor treatment for MRSA infections.   Gram stain     Status: None   Collection Time: 09/15/17  3:30 PM  Result Value Ref Range Status   Specimen Description PLEURAL  Final   Special Requests NONE  Final   Gram Stain   Final    Performed at [place] PMN AND MONONUCLEAR NO ORGANISMS SEEN    Report Status 09/15/2017 FINAL  Final  Culture, body fluid-bottle     Status: None (Preliminary result)   Collection Time: 09/15/17  4:00 PM  Result Value Ref Range Status   Specimen Description ASCITIC  Final   Special Requests   Final    BOTTLES DRAWN AEROBIC AND ANAEROBIC Blood Culture adequate volume   Culture NO GROWTH 4 DAYS  Final   Report Status PENDING  Incomplete   Studies/Results: Dg Chest Port 1 View  Result Date: 09/18/2017 CLINICAL DATA:  Bilateral pleural effusions. EXAM: PORTABLE CHEST 1 VIEW COMPARISON:  Radiographs of September 16, 2017. FINDINGS: Stable cardiomegaly and central pulmonary vascular congestion is noted. No pneumothorax is noted. Stable diffuse interstitial densities are noted throughout both lungs concerning for pulmonary  edema or inflammation. Bilateral pleural effusions are noted, with left greater than right. Probable underlying atelectasis is noted. Degenerative changes seen involving both glenohumeral joints. IMPRESSION: Stable cardiomegaly and bilateral pulmonary edema and pleural effusions is again noted. Electronically Signed   By: Marijo Conception, M.D.   On: 09/18/2017 09:08  Medications:  Prior to Admission:  Medications Prior to Admission  Medication Sig Dispense Refill Last Dose  . ALPRAZolam (XANAX) 0.25 MG tablet Take 0.125-0.25 mg by mouth at bedtime.    09/12/2017 at Unknown time  . apixaban (ELIQUIS) 2.5 MG TABS tablet Take 1 tablet (2.5 mg total) by mouth 2 (two) times daily. 180 tablet 3 09/13/2017 at 1100a  . diltiazem (CARDIZEM) 60 MG tablet Take 60 mg by mouth 4 (four) times daily.    09/13/2017 at Unknown time  . furosemide (LASIX) 20 MG tablet Take 1 tablet (20 mg total) by mouth daily. 30 tablet 0 09/13/2017 at Unknown time  . levothyroxine (SYNTHROID, LEVOTHROID) 50 MCG tablet TAKE ONE TABLET BY MOUTH ONCE A DAY AS DIRECTED 90 tablet 1 09/13/2017 at Unknown time  . Metoprolol Tartrate 37.5 MG TABS Take 37.5 mg by mouth 2 (two) times daily. 180 tablet 3 09/13/2017 at 1100a  . Probiotic Product (PROBIOTIC-10) CHEW Chew 2 tablets by mouth daily.   Past Week at Unknown time  . metroNIDAZOLE (FLAGYL) 500 MG tablet Take 1 tablet 3 x / day after meals for infection (Patient not taking: Reported on 09/13/2017) 42 tablet 0 Not Taking at Unknown time  . silver sulfADIAZINE (SILVADENE) 1 % cream Apply to affected area daily (Patient not taking: Reported on 09/13/2017) 50 g 1 Completed Course at Unknown time  . traMADol (ULTRAM) 50 MG tablet Take 1 tablet (50 mg total) by mouth every 6 (six) hours as needed. 20 tablet 0 UNKNOWN   Scheduled: . apixaban  2.5 mg Oral BID  . diltiazem  60 mg Oral Q6H  . furosemide  40 mg Intravenous Daily  . hydrocerin   Topical BID  . ipratropium  0.5 mg  Nebulization TID  . levalbuterol  0.63 mg Nebulization TID  . levothyroxine  25 mcg Intravenous QAC breakfast  . mouth rinse  15 mL Mouth Rinse BID  . metoprolol tartrate  25 mg Oral BID  . pantoprazole (PROTONIX) IV  40 mg Intravenous Q24H  . vancomycin  125 mg Oral Q6H   Continuous:  XBW:[id number] **OR** acetaminophen, ALPRAZolam, LORazepam, ondansetron **OR** ondansetron (ZOFRAN) IV  Assesment: She was admitted with multiple problems including acute on chronic hypoxic respiratory failure, probable small bowel obstruction which has resolved, C. difficile colitis, atrial fib with rapid ventricular response, bilateral pleural effusions, acute on chronic diastolic heart failure.  She is slowly improving.  Her heart rate is better controlled.  Her breathing is better but she is still requiring more oxygen than previously.  She had thoracentesis x2 which has significantly improved her symptoms. Active Problems:   Bilateral pleural effusion   Acute on chronic respiratory failure with hypoxia (HCC)   AF (paroxysmal atrial fibrillation) (HCC)   Protein-calorie malnutrition, severe (HCC)   Acute on chronic diastolic CHF (congestive heart failure) (HCC)   SBO (small bowel obstruction) (HCC)   C. difficile colitis   Bronchiectasis ([place])   Congestive heart failure ([place])   Palliative care encounter   Goals of care, counseling/discussion   DNR (do not resuscitate) discussion   Recurrent left pleural effusion   S/P thoracentesis    Plan: Continue treatments.  She is improving but slowly    LOS: 6 days   Xadrian Craighead L 09/19/2017, 8:16 AM

## 2017-09-19 NOTE — Progress Notes (Signed)
PROGRESS NOTE  Andrea [name] TDS:[id number] DOB: Jan 10, 1929 DOA: 09/13/2017 PCP: Unk Pinto, MD  Brief History: 83 year old female with a history of bronchiectasis/COPD, chronic respiratory failure on 2 L at night, hypertension, hyperlipidemia, diastolic CHF, hypothyroidism, and PAT/PAFpresented with 4-5-day history of diarrhea and 1 day history of nausea and vomiting. The patient was recently discharged from the hospital after a stay from August 26, 2017 through September 01, 2017. During that hospital stay, the patient was treated for acute on chronic diastolic CHF as well as finishing treatment for C. difficile colitis with metronidazole.The last day of metronidazole was August 30, 2017. During that hospitalization, the patient also underwent a left-sided thoracocentesis. Fluid studies revealed that it was transudate of. The patient was discharged home with furosemide 20 mg daily. Unfortunately, the patient began having diarrhea 4-5 days prior to this admission. The patient went to see her primary care provider and C. difficile assay was positive again. The patient was restarted on metronidazole. However,the patient states that she was not taking it consistently. In addition, the patient intermittently does not take her cardiac medications including but not limited to metoprolol, diltiazem, and furosemide. The patient complains of dyspnea on exertion, but states that this is not much worse than usual. In addition, the patient states that her lower extremity edema is about the same as usual. She denies any hematochezia, hematemesis, fevers, chills, chest pain, headache, neck pain. Upon presentation, CT of the abdomen and pelvis revealed moderate bilateral pleural effusions, left greater than right. There was also a dilated fluid-filled small bowel loops up to 5.3 cm with a transition point in the mid to distal ileum concerning for bowel obstruction. Chest x-ray revealed  increased interstitial prominence with bilateral pleural effusions.    Assessment/Plan: Acute on chronic diastolic CHF -August 26, 2017 echo EF 60-65%, grade 2 DD, moderate TR, PASP 61 -daily weights--question accuracy -accurate I/O's--not accurate -fluid restrict -ContinueIV Lasix--decreased to once daily on 12/20 due to worsen renal function -cardiology consult appreciated -repeat CXR 12/20 personally reviewed, slight worsen pleural effusions and pulm edema -12/20--Left Thoracocentesis--> 1.3L, transudate -12/21--Right Thoracocentesis--1L, transudate -repeat CXR 12/23 shows stable cardiomegaly and bilateral pulmonary edema and pleural effusions.   -Still has a significant oxygen requirement and evidence of volume overload -Continue IV diuresis  Bilateral Pleural Effusions -transudate by fluid analysis -08/29/17--Left Thora--1L -09/15/17--Left Thora--1.3L -09/16/17--R-Thora--1L -Continue with IV diuresis  Small bowel obstruction -Likely secondary to C. difficile colitis the patient also had a small bowel obstruction July 2018 secondary to adhesions. -Appreciate surgical consultation-->nonoperative management for now -she is a poor surgical candidate -repeat abd xray--personally reviewed--No Air Fluid Levels, minimal SB dilatation -changed vancomycin to po 12/21 as bowel function returning  -Tolerating solid diet, passing flatus and still having bowel movements.  Recurrent C. difficile colitis--Severe -She is being treated with vancomycin enemas, p.o. vancomycin as well as IV metronidazole -Since overall bowel function has improved and she is able to tolerate p.o., will discontinue vancomycin enemas as well as metronidazole. -Continue on oral vancomycin, will likely need prolonged taper since this is a recurrent episode.  Atrial fibrillation with RVR/Atrial tachycardia -Briefly treated with Cardizem infusion, now on oral Cardizem -Heart rate remains uncontrolled,  with persistent tachycardia -Increase metoprolol from 25 mg to 50 mg twice daily. -TSH--4.511 -Anticoagulated with Eliquis  Acute on chronic respiratory failure with hypoxia -Secondary to CHFin the setting of bronchiectasis -09/14/17 evening-->increased oxygen demand to 12 L HFNC -12/24-->4L -Normally on 2  L nasal cannula at home -Wean oxygen back to 2 L for oxygen saturation greater than 92%  Acute on chronic renal failure--CKD3 -baseline creatinine 0.8-1.1 -serum creatinine peaked 1.80 -12/20--Lasix IV decreased to once daily -Renal function appears to be stable/improving  COPD/bronchiectasis/chronic respiratory failure with hypoxia -On 2 L nasal cannula atnighttime at home -continueduonebs -pulmonary consult appreciated  Essential hypertension -Holdingoral metoprolol and diltiazeminitially due to SBO -hydralazine IV prn SBP >180  Anxiety -IV ativan q 8 hrs prn until able to take po xanax  Severe protein calorie malnutrition -Consult nutrition  Goals of Care -family has unrealistic expectations--becomes angry and wants to dismiss physicians/care team members when given any type of non-positive clinical information of any kind -they have dismissed the palliative medicine consultant -remains FULL CODE   Disposition Plan:Discharge home once improved Family Communication:No family at bedside   Consultants:General surgery; palliative med, cardiology, pulmonology  Code Status: FULL   DVT Prophylaxis: Eliquis   Procedures: As Listed in Progress Note Above  Antibiotics: Vanco po/Enema 12/19>>>  Subjective: Feels breathing is about the same today.  No chest pain.  Objective: Vitals:   09/19/17 0658 09/19/17 0806 09/19/17 1437 09/19/17 1540  BP: 139/83   (!) 141/76  Pulse: (!) 110   (!) 110  Resp: 16   18  Temp: 98.2 F (36.8 C)   97.8 F (36.6 C)  TempSrc: Oral     SpO2: 91% 91% 97% 93%  Weight:      Height:         Intake/Output Summary (Last 24 hours) at 09/19/2017 1804 Last data filed at 09/19/2017 1541 Gross per 24 hour  Intake 600 ml  Output 1200 ml  Net -600 ml   Weight change:  Exam:   General:  Pt is alert, follows commands appropriately, not in acute distress  HEENT: No icterus, No thrush, No neck mass, [place]/AT  Cardiovascular: IRRR, S1/S2, no rubs, no gallops  Respiratory: Diminished breath sounds at bases with crackles at bases bilaterally  Abdomen: Soft/+BS, non tender, non distended, no guarding  Extremities: 1+LE edema, No lymphangitis, No petechiae, No rashes, no synovitis   Data Reviewed: I have personally reviewed following labs and imaging studies Basic Metabolic Panel: Recent Labs  Lab 09/15/17 0407 09/16/17 0359 09/17/17 0438 09/18/17 0610 09/19/17 0540  NA 140 139 140 140 143  K 4.1 4.0 4.2 3.4* 3.7  CL 99* 99* 99* 97* 97*  CO2 _0 36*  GLUCOSE 111* 147* 107* 115* 112*  BUN 41* 43* 37* 28* 21*  CREATININE 1.80* 1.60* 1.20* 1.02* 0.91  CALCIUM 8.1* 8.2* 8.0* 8.2* 8.4*  MG 1.8  --   --   --   --    Liver Function Tests: Recent Labs  Lab 09/13/17 1638 09/14/17 0404 09/16/17 0359  AST _1 ALT _2 ALKPHOS 70 54 59  BILITOT 0.8 0.6 0.4  PROT 7.8 6.4* 6.4*  ALBUMIN 3.2* 2.5* 2.5*   No results for input(s): LIPASE, AMYLASE in the last 168 hours. No results for input(s): AMMONIA in the last 168 hours. Coagulation Profile: No results for input(s): INR, PROTIME in the last 168 hours. CBC: Recent Labs  Lab 09/13/17 1638 09/14/17 0404 09/15/17 0407 09/16/17 0359 09/17/17 0438 09/18/17 0610  WBC 17.1* 13.1* 13.9* 11.9* 10.7* 9.4  NEUTROABS 14.4*  --   --   --   --   --   HGB 11.8* 10.4* 10.6* 10.3* 9.3* 10.1*  HCT 40.6  36.1 37.4 36.0 31.3* 33.9*  MCV 94.9 97.6 99.5 97.3 94.8 94.7  PLT 385 352 359 274 280 306   Cardiac Enzymes: Recent Labs  Lab 09/13/17 1638  TROPONINI <0.03   BNP: Invalid input(s): POCBNP CBG: No  results for input(s): GLUCAP in the last 168 hours. HbA1C: No results for input(s): HGBA1C in the last 72 hours. Urine analysis:    Component Value Date/Time   COLORURINE YELLOW 09/13/2017 [place] 09/13/2017 1647   LABSPEC 1.010 09/13/2017 1647   PHURINE 6.0 09/13/2017 1647   GLUCOSEU NEGATIVE 09/13/2017 1647   HGBUR NEGATIVE 09/13/2017 [place] 09/13/2017 1647   KETONESUR NEGATIVE 09/13/2017 1647   PROTEINUR 30 (A) 09/13/2017 1647   UROBILINOGEN 0.2 08/20/2014 1613   NITRITE NEGATIVE 09/13/2017 1647   LEUKOCYTESUR TRACE (A) 09/13/2017 1647   Sepsis Labs: _0 (procalcitonin:4,lacticidven:4) ) Recent Results (from the past 240 hour(s))  Blood culture (routine x 2)     Status: None   Collection Time: 09/13/17  4:38 PM  Result Value Ref Range Status   Specimen Description BLOOD RIGHT FOREARM DRAWN BY RN  Final   Special Requests   Final    BOTTLES DRAWN AEROBIC AND ANAEROBIC Blood Culture adequate volume   Culture NO GROWTH 5 DAYS  Final   Report Status 09/18/2017 FINAL  Final  Blood culture (routine x 2)     Status: None   Collection Time: 09/13/17  4:50 PM  Result Value Ref Range Status   Specimen Description LEFT ANTECUBITAL  Final   Special Requests   Final    BOTTLES DRAWN AEROBIC AND ANAEROBIC Blood Culture adequate volume   Culture NO GROWTH 5 DAYS  Final   Report Status 09/18/2017 FINAL  Final  MRSA PCR Screening     Status: None   Collection Time: 09/14/17  2:20 PM  Result Value Ref Range Status   MRSA by PCR NEGATIVE NEGATIVE Final    Comment:        The GeneXpert MRSA Assay (FDA approved for NASAL specimens only), is one component of a comprehensive MRSA colonization surveillance program. It is not intended to diagnose MRSA infection nor to guide or monitor treatment for MRSA infections.   Gram stain     Status: None   Collection Time: 09/15/17  3:30 PM  Result Value Ref Range Status   Specimen Description  PLEURAL  Final   Special Requests NONE  Final   Gram Stain   Final    Performed at [facility] PMN AND MONONUCLEAR NO ORGANISMS SEEN    Report Status 09/15/2017 FINAL  Final  Culture, body fluid-bottle     Status: None (Preliminary result)   Collection Time: 09/15/17  4:00 PM  Result Value Ref Range Status   Specimen Description ASCITIC  Final   Special Requests BOTTLES DRAWN AEROBIC AND ANAEROBIC 10CC  Final   Culture NO GROWTH 4 DAYS  Final   Report Status PENDING  Incomplete     Scheduled Meds: . apixaban  2.5 mg Oral BID  . diltiazem  60 mg Oral Q6H  . furosemide  40 mg Intravenous Daily  . hydrocerin   Topical BID  . ipratropium  0.5 mg Nebulization TID  . levalbuterol  0.63 mg Nebulization TID  . [START ON 09/20/2017] levothyroxine  50 mcg Oral QAC breakfast  . mouth rinse  15 mL Mouth Rinse BID  . metoprolol tartrate  50 mg Oral BID  .  nystatin  5 mL Oral QID  . [START ON 09/20/2017] pantoprazole  40 mg Oral Daily  . vancomycin  125 mg Oral Q6H   Continuous Infusions:   Procedures/Studies: Dg Chest 1 View  Result Date: 08/29/2017 INDICATION: Pleural effusions resistant to diuresis. EXAM: ULTRASOUND GUIDED LEFT THORACENTESIS MEDICATIONS: None. COMPLICATIONS: None immediate. PROCEDURE: An ultrasound guided thoracentesis was thoroughly discussed with the patient and questions answered. The benefits, risks, alternatives and complications were also discussed. The patient is at increased bleeding risk due to Eliquis use and we had a lengthy discussion about this; the patient's husband was also called on the telephone. The patient understands and wishes to proceed with the procedure. Written consent was obtained. Ultrasound was performed to localize and mark an adequate pocket of fluid in the left chest. The area was then prepped and draped in the normal sterile fashion. 1% Lidocaine was used for local anesthesia. Under ultrasound guidance  a 19 gauge, 7-cm, Yueh catheter was introduced. Thoracentesis was performed until the available fluid was drained. I scanned the left chest and there was no evidence of bleeding. The catheter was removed and a dressing applied. FINDINGS: A total of approximately 1 L of amber fluid was removed. Samples were sent to the laboratory as requested by the clinical team. A chest x-ray was obtained and shows significant decrease in left pleural effusion, with small volume fluid remaining. No visible pneumothorax. There is a moderate right pleural effusion. Cardiomegaly and vascular congestion. IMPRESSION: 1. Successful ultrasound guided left thoracentesis yielding 1 L of pleural fluid. 2. Minimal fluid remaining on the left. Right pleural effusion is moderate. Electronically Signed   By: Monte Fantasia M.D.   On: 08/29/2017 11:35   Dg Chest 2 View  Result Date: 09/01/2017 CLINICAL DATA:  Pleural effusions EXAM: CHEST  2 VIEW COMPARISON:  08/30/2017 CXR FINDINGS: Stable cardiac silhouette partially obscured by bilateral pleural effusions. Aortic atherosclerosis is noted. Decrease in size of small right pleural effusion with slight increase in left-sided pleural effusion. Adjacent bibasilar atelectasis. Interval decrease in pulmonary vascular redistribution. Osteoarthritis of the [facility] and glenohumeral joint on the right with high-riding right humeral head and remodeling of the undersurface of the acromion. Findings likely reflect chronic rotator cuff tears. IMPRESSION: 1. Slight decrease in right small pleural effusion and minimal increase in small left pleural effusion since prior. 2. Interval decrease in CHF. 3. Aortic atherosclerosis. Electronically Signed   By: Ashley Royalty M.D.   On: 09/01/2017 14:28   Dg Chest 2 View  Result Date: 08/26/2017 CLINICAL DATA:  Patient having increased SOB over the past few days. Patient is on oxygen at home and states that she usually only wears it at night but has been wearing it  all day since having SOB. Hx of pna, htn-controlled with medication, COPD. Ex-smoker. EXAM: CHEST  2 VIEW COMPARISON:  06/28/2017 FINDINGS: The lungs are hyperinflated likely secondary to COPD. There are moderate bilateral pleural effusions with bibasilar atelectasis. There is bilateral diffuse interstitial thickening. There is no pneumothorax. Stable cardiomediastinal silhouette. There is no acute osseous abnormality. There is severe osteoarthritis of the right glenohumeral joint. There is mild osteoarthritis of the left glenohumeral joint. IMPRESSION: 1. Findings consistent with CHF. Electronically Signed   By: Kathreen Devoid   On: 08/26/2017 13:36   Ct Abdomen Pelvis W Contrast  Result Date: 09/13/2017 CLINICAL DATA:  Abdominal distension with vomiting and diarrhea. Shortness of breath. EXAM: CT ABDOMEN AND PELVIS WITH CONTRAST TECHNIQUE: Multidetector CT imaging of the  abdomen and pelvis was performed using the standard protocol following bolus administration of intravenous contrast. CONTRAST:  184m ISOVUE-300 IOPAMIDOL (ISOVUE-300) INJECTION 61% COMPARISON:  08/19/2017 FINDINGS: Lower chest: Emphysema. Persistent bibasilar airspace disease. Mild cardiomegaly with LAD coronary artery atherosclerosis. Moderate bilateral pleural effusions, larger on the left. Similar. Hepatobiliary: Perfusion anomaly involving the anterior left hepatic lobe image 33/ series 2. Normal gallbladder, without biliary ductal dilatation. Pancreas: Normal, without mass or ductal dilatation. Spleen: Normal in size, without focal abnormality. Adrenals/Urinary Tract: Normal right adrenal gland. Minimal left adrenal thickening and nodularity. Bilateral renal cortical thinning. Right renal lesions are favored to represent cysts or minimally complex cysts. Bilateral too small to characterize renal lesions. No hydronephrosis. Normal urinary bladder. Stomach/Bowel: Normal stomach, without wall thickening. Scattered colonic diverticula. The  colon is relatively decompressed. Small bowel loops are fluid-filled and dilated, including at up to 5.3 cm. A transition point is identified within the mid to distal ileum, including on images 73 through 77/series 2. No bowel wall thickening to suggest complicating ischemia. No pneumatosis or free intraperitoneal air. Vascular/Lymphatic: Aortic and branch vessel atherosclerosis. No abdominopelvic adenopathy. Reproductive: Hysterectomy. Other: Small volume cul-de-sac fluid including on 77/series 2. Pelvic floor laxity. Musculoskeletal: Osteopenia.  Convex right lumbar spine curvature. IMPRESSION: 1. High-grade partial small bowel obstruction involving the mid to distal ileum. Likely due to adhesions. Although there is adjacent ascites, no specific evidence of complicating ischemia identified. 2. Bilateral pleural effusions and bibasilar atelectasis, as before. 3. Coronary artery atherosclerosis. Aortic Atherosclerosis (ICD10-I70.0). 4. Pelvic floor laxity. Electronically Signed   By: KAbigail MiyamotoM.D.   On: 09/13/2017 20:47   Dg Chest Port 1 View  Result Date: 09/18/2017 CLINICAL DATA:  Bilateral pleural effusions. EXAM: PORTABLE CHEST 1 VIEW COMPARISON:  Radiographs of September 16, 2017. FINDINGS: Stable cardiomegaly and central pulmonary vascular congestion is noted. No pneumothorax is noted. Stable diffuse interstitial densities are noted throughout both lungs concerning for pulmonary edema or inflammation. Bilateral pleural effusions are noted, with left greater than right. Probable underlying atelectasis is noted. Degenerative changes seen involving both glenohumeral joints. IMPRESSION: Stable cardiomegaly and bilateral pulmonary edema and pleural effusions is again noted. Electronically Signed   By: JMarijo Conception M.D.   On: 09/18/2017 09:08   Dg Chest Port 1 View  Result Date: 09/16/2017 CLINICAL DATA:  RIGHT pleural effusion post thoracentesis, CHF EXAM: PORTABLE CHEST 1 VIEW COMPARISON:   Portable exam 18099hours compared to 09/15/2017 FINDINGS: Small bibasilar pleural effusions and atelectasis. Persistent pulmonary edema increased on LEFT since prior exam. No pneumothorax following RIGHT thoracentesis. Persistent enlargement of cardiac silhouette with pulmonary vascular congestion. Atherosclerotic calcification aorta. Bones demineralized with BILATERAL chronic rotator cuff tears. IMPRESSION: No pneumothorax following RIGHT thoracentesis. Persistent pulmonary edema increased since previous exam with residual bibasilar effusions and atelectasis. Electronically Signed   By: MLavonia DanaM.D.   On: 09/16/2017 13:24   Dg Chest Port 1 View  Result Date: 09/15/2017 CLINICAL DATA:  Small bowel obstruction EXAM: PORTABLE CHEST 1 VIEW COMPARISON:  Portable exam 1324 hours compared to 09/13/2017 FINDINGS: Enlargement of cardiac silhouette with pulmonary vascular congestion. Diffuse pulmonary edema consistent with CHF. Associated bibasilar pleural effusions and atelectasis slightly greater on LEFT, appear progressive since prior study. No pneumothorax. Bones demineralized IMPRESSION: CHF with diffuse pulmonary edema, bibasilar effusions and atelectasis. BILATERAL pleural effusions and basilar atelectasis appear mildly progressive since previous exam Electronically Signed   By: MLavonia DanaM.D.   On: 09/15/2017 14:54   Dg Chest Portable  1 View  Result Date: 09/13/2017 CLINICAL DATA:  Shortness of breath.  Vomiting and diarrhea. EXAM: PORTABLE CHEST 1 VIEW COMPARISON:  09/01/2017 FINDINGS: Small to moderate bilateral pleural effusions, increased. Diffuse interstitial and airspace opacity, increased. Probable cardiomegaly, although partially obscured. No pneumothorax. IMPRESSION: CHF pattern including small to moderate pleural effusions. Electronically Signed   By: Monte Fantasia M.D.   On: 09/13/2017 17:53   Dg Chest Port 1 View  Result Date: 08/30/2017 CLINICAL DATA:  COPD.  Pleural effusion. EXAM:  PORTABLE CHEST 1 VIEW COMPARISON:  08/29/2017. FINDINGS: Cardiomegaly with pulmonary venous congestion and bilateral interstitial prominence with bilateral pleural effusions again noted. Findings consistent with CHF. No significant change from prior study of 08/29/2017. No evidence of pneumothorax status post recent thoracentesis. IMPRESSION: 1. Persistent cardiomegaly with pulmonary venous congestion, bilateral interstitial prominence and bilateral pleural effusions again noted without significant change. Findings consistent CHF. 2. No evidence of progression of pleural effusions. No pneumothorax status post recent left thoracentesis. Electronically Signed   By: Marcello Moores  Register   On: 08/30/2017 08:43   Dg Chest Port 1 View  Result Date: 08/28/2017 CLINICAL DATA:  Hypoxemia, bilateral pleural effusions EXAM: PORTABLE CHEST 1 VIEW COMPARISON:  08/27/2017 FINDINGS: Moderate to large bilateral pleural effusions. Diffuse bilateral airspace disease again noted. No real change since prior study. Cardiomegaly. IMPRESSION: Severe diffuse bilateral airspace disease and a large effusions. No real change. Electronically Signed   By: Rolm Baptise M.D.   On: 08/28/2017 10:27   Dg Chest Port 1 View  Result Date: 08/27/2017 CLINICAL DATA:  Pleural effusion. EXAM: PORTABLE CHEST 1 VIEW COMPARISON:  08/26/2017 FINDINGS: Moderate bilateral pleural effusions. Diffuse bilateral airspace disease, likely edema/ CHF. Cardiomegaly. Slight interval worsening edema since prior study. IMPRESSION: Worsening aeration of the lungs compatible with worsening edema/ CHF. Moderate bilateral effusions are stable. Electronically Signed   By: Rolm Baptise M.D.   On: 08/27/2017 13:19   Dg Chest Port 1v Same Day  Result Date: 09/15/2017 CLINICAL DATA:  CHF, BILATERAL pleural effusions post LEFT thoracentesis EXAM: PORTABLE CHEST 1 VIEW COMPARISON:  Portable exam 1615 hours compared to earlier study of at 1324 hours FINDINGS: Enlargement of  cardiac silhouette with pulmonary vascular congestion. Slightly decreased pulmonary edema in LEFT lung. Decreased LEFT pleural effusion and basilar atelectasis post thoracentesis. No pneumothorax. Persistent RIGHT pulmonary edema, RIGHT pleural effusion and basilar atelectasis. Biapical scarring. Bones demineralized. IMPRESSION: No pneumothorax following LEFT thoracentesis. Electronically Signed   By: Lavonia Dana M.D.   On: 09/15/2017 16:40   Dg Abd 2 Views  Result Date: 09/15/2017 CLINICAL DATA:  Bowel obstruction EXAM: ABDOMEN - 2 VIEW COMPARISON:  September 14, 2017 FINDINGS: Supine and upright images were obtained. There are loops of mildly dilated small bowel in the lower abdomen and pelvis, similar to 1 day prior. There are foci of contrast in the colon. There are scattered colonic diverticula. No evident free air. There is contrast in the urinary bladder. There is lumbar scoliosis. IMPRESSION: Contrast is seen in the colon. There are scattered colonic diverticula. Mild dilatation of several small bowel loops, similar to 1 day prior. No air-fluid levels appreciable. No free air. Electronically Signed   By: Lowella Grip III M.D.   On: 09/15/2017 22:59   Dg Abd 2 Views  Result Date: 09/14/2017 CLINICAL DATA:  Small-bowel obstruction EXAM: ABDOMEN - 2 VIEW COMPARISON:  CT abdomen 09/13/2017 FINDINGS: Dilated small bowel loops in the pelvis similar to improved from the prior study. Contrast in the  renal collecting system bilaterally from recent CT. No renal obstruction. Moderate lumbar scoliosis.  No acute skeletal abnormality. IMPRESSION: Small bowel dilatation similar to slightly improved from yesterday. Electronically Signed   By: Franchot Gallo M.D.   On: 09/14/2017 07:48   US Thoracentesis Asp Pleural Space W/img Guide  Result Date: 09/16/2017 INDICATION: CHF, BILATERAL pleural effusions, prior LEFT thoracentesis, for RIGHT thoracentesis TODAY EXAM: ULTRASOUND GUIDED DIAGNOSTIC AND  THERAPEUTIC RIGHT THORACENTESIS MEDICATIONS: None. COMPLICATIONS: None immediate. PROCEDURE: Procedure, benefits, and risks of procedure were discussed with patient. Written informed consent for procedure was obtained. Time out protocol followed. Pleural effusion localized by ultrasound at the posterior RIGHT hemithorax. Skin prepped and draped in usual sterile fashion. Skin and soft tissues anesthetized with 10 mL of 1% lidocaine. 8 French thoracentesis catheter placed into the RIGHT pleural space. 1 L of amber colored RIGHT pleural fluid aspirated by vacuum bottle. Procedure tolerated well by patient without immediate complication. FINDINGS: A total of approximately 1 L of RIGHT pleural fluid was removed. Samples were sent to the laboratory as requested by the clinical team. IMPRESSION: Successful ultrasound guided RIGHT thoracentesis yielding 1 of pleural fluid. Electronically Signed   By: Lavonia Dana M.D.   On: 09/16/2017 13:28   US Thoracentesis Asp Pleural Space W/img Guide  Result Date: 09/15/2017 INDICATION: CHF, BILATERAL pleural effusions EXAM: ULTRASOUND GUIDED DIAGNOSTIC AND THERAPEUTIC LEFT THORACENTESIS MEDICATIONS: None. COMPLICATIONS: None immediate. PROCEDURE: Procedure, benefits, and risks of procedure were discussed with patient. Written informed consent for procedure was obtained. Time out protocol followed. Pleural effusion localized by ultrasound at the posterior LEFT hemithorax. Skin prepped and draped in usual sterile fashion. Skin and soft tissues anesthetized with 10 mL of 1% lidocaine. 8 French thoracentesis catheter placed into the LEFT pleural space. 1.3 L of amber colored LEFT pleural fluid was aspirated by syringe pump. Procedure tolerated well by patient without immediate complication. FINDINGS: A total of approximately 1.3 L of LEFT pleural fluid was removed. Samples were sent to the laboratory as requested by the clinical team. IMPRESSION: Successful ultrasound guided LEFT  thoracentesis yielding 1.3 of pleural fluid. Electronically Signed   By: Lavonia Dana M.D.   On: 09/15/2017 16:34   US Thoracentesis Asp Pleural Space W/img Guide  Result Date: 08/29/2017 INDICATION: Pleural effusions resistant to diuresis. EXAM: ULTRASOUND GUIDED LEFT THORACENTESIS MEDICATIONS: None. COMPLICATIONS: None immediate. PROCEDURE: An ultrasound guided thoracentesis was thoroughly discussed with the patient and questions answered. The benefits, risks, alternatives and complications were also discussed. The patient is at increased bleeding risk due to Eliquis use and we had a lengthy discussion about this; the patient's husband was also called on the telephone. The patient understands and wishes to proceed with the procedure. Written consent was obtained. Ultrasound was performed to localize and mark an adequate pocket of fluid in the left chest. The area was then prepped and draped in the normal sterile fashion. 1% Lidocaine was used for local anesthesia. Under ultrasound guidance a 19 gauge, 7-cm, Yueh catheter was introduced. Thoracentesis was performed until the available fluid was drained. I scanned the left chest and there was no evidence of bleeding. The catheter was removed and a dressing applied. FINDINGS: A total of approximately 1 L of amber fluid was removed. Samples were sent to the laboratory as requested by the clinical team. A chest x-ray was obtained and shows significant decrease in left pleural effusion, with small volume fluid remaining. No visible pneumothorax. There is a moderate right pleural effusion. Cardiomegaly and vascular  congestion. IMPRESSION: 1. Successful ultrasound guided left thoracentesis yielding 1 L of pleural fluid. 2. Minimal fluid remaining on the left. Right pleural effusion is moderate. Electronically Signed   By: Monte Fantasia M.D.   On: 08/29/2017 11:35    Kathie Dike, MD  [facility] Pager [phone]  If 7PM-7AM, please contact  night-coverage [email or web address] Password [email or web address] 09/19/2017, 6:04 PM   LOS: 6 days

## 2017-09-19 NOTE — Progress Notes (Signed)
Progress Note  Patient Name: Andrea [name] Date of Encounter: 09/19/2017  Primary Cardiologist: Kirk Ruths, MD  Subjective  Doing better, still mildly dyspneic without oxygen.  Nurses placing an extension on oxygen tubing so that she can sit up in chair.  She is hoping to go home.  Inpatient Medications    Scheduled Meds: . apixaban  2.5 mg Oral BID  . diltiazem  60 mg Oral Q6H  . furosemide  40 mg Intravenous Daily  . hydrocerin   Topical BID  . ipratropium  0.5 mg Nebulization TID  . levalbuterol  0.63 mg Nebulization TID  . levothyroxine  25 mcg Intravenous QAC breakfast  . mouth rinse  15 mL Mouth Rinse BID  . metoprolol tartrate  25 mg Oral BID  . pantoprazole (PROTONIX) IV  40 mg Intravenous Q24H  . vancomycin  125 mg Oral Q6H   Continuous Infusions:  PRN Meds: acetaminophen **OR** acetaminophen, ALPRAZolam, LORazepam, ondansetron **OR** ondansetron (ZOFRAN) IV   Vital Signs    Vitals:   09/18/17 1955 09/18/17 2045 09/19/17 0658 09/19/17 0806  BP:  (!) 147/73 139/83   Pulse: 88 (!) 114 (!) 110   Resp: _0 Temp:  98.1 F (36.7 C) 98.2 F (36.8 C)   TempSrc:  Oral Oral   SpO2: 90% 90% 91% 91%  Weight:      Height:        Intake/Output Summary (Last 24 hours) at 09/19/2017 0839 Last data filed at 09/19/2017 0659 Gross per 24 hour  Intake 600 ml  Output 150 ml  Net 450 ml   Filed Weights   09/13/17 1622 09/16/17 0545 09/17/17 1321  Weight: 105 lb (47.6 kg) 119 lb 14.9 oz (54.4 kg) 122 lb 4.8 oz (55.5 kg)    Telemetry    Atrial fib  80s to 120s    Physical Exam   GEN: No acute distress.  Thin, frail. Neck: JVP is increased   Cardiac: RRR, soft systolic murmurs, rubs, or gallops.  Respiratory: Clear to auscultation bilaterally.  No wheezes or rhonchi.  Mild rales at bases   GI: Soft, nontender, non-distended  MS: No edema; No deformity. Neuro:  Nonfocal  Psych: Normal affect   Labs    Chemistry Recent Labs  Lab  09/13/17 1638 09/14/17 0404  09/16/17 0359 09/17/17 0438 09/18/17 0610 09/19/17 0540  NA 141 142   < > 139 140 140 143  K 4.1 4.3   < > 4.0 4.2 3.4* 3.7  CL 96* 100*   < > 99* 99* 97* 97*  CO2 34* 31   < > _1 36*  GLUCOSE 121* 117*   < > 147* 107* 115* 112*  BUN 27* 29*   < > 43* 37* 28* 21*  CREATININE 1.10* 1.17*   < > 1.60* 1.20* 1.02* 0.91  CALCIUM 9.2 8.1*   < > 8.2* 8.0* 8.2* 8.4*  PROT 7.8 6.4*  --  6.4*  --   --   --   ALBUMIN 3.2* 2.5*  --  2.5*  --   --   --   AST 22 21  --  18  --   --   --   ALT 17 15  --  14  --   --   --   ALKPHOS 70 54  --  59  --   --   --   BILITOT 0.8 0.6  --  0.4  --   --   --  GFRNONAA 44* 41*   < > 28* 40* 48* 56*  GFRAA 51* 47*   < > 33* 46* 56* >60  ANIONGAP 11 11   < > _0 < > = values in this interval not displayed.     Hematology Recent Labs  Lab 09/16/17 0359 09/17/17 0438 09/18/17 0610  WBC 11.9* 10.7* 9.4  RBC 3.70* 3.30* 3.58*  HGB 10.3* 9.3* 10.1*  HCT 36.0 31.3* 33.9*  MCV 97.3 94.8 94.7  MCH 27.8 28.2 28.2  MCHC 28.6* 29.7* 29.8*  RDW 14.4 14.6 14.6  PLT 274 280 306    Cardiac Enzymes Recent Labs  Lab 09/13/17 1638  TROPONINI <0.03   No results for input(s): TROPIPOC in the last 168 hours.   BNPNo results for input(s): BNP, PROBNP in the last 168 hours.   DDimer No results for input(s): DDIMER in the last 168 hours.   Radiology    Dg Chest Port 1 View  Result Date: 09/18/2017 CLINICAL DATA:  Bilateral pleural effusions. EXAM: PORTABLE CHEST 1 VIEW COMPARISON:  Radiographs of September 16, 2017. FINDINGS: Stable cardiomegaly and central pulmonary vascular congestion is noted. No pneumothorax is noted. Stable diffuse interstitial densities are noted throughout both lungs concerning for pulmonary edema or inflammation. Bilateral pleural effusions are noted, with left greater than right. Probable underlying atelectasis is noted. Degenerative changes seen involving both glenohumeral joints.  IMPRESSION: Stable cardiomegaly and bilateral pulmonary edema and pleural effusions is again noted. Electronically Signed   By: Marijo Conception, M.D.   On: 09/18/2017 09:08    Cardiac Studies  Echocardiogram 08/26/2017 Left ventricle: The cavity size was normal. Wall thickness was normal. Systolic function was normal. The estimated ejection fraction was in the range of 60% to 65%. Wall motion was normal; there were no regional wall motion abnormalities. Features are consistent with a pseudonormal left ventricular filling pattern, with concomitant abnormal relaxation and increased filling pressure (grade 2 diastolic dysfunction). - Aortic valve: Mildly calcified annulus. Trileaflet. There was mild regurgitation. - Mitral valve: There was mild regurgitation. - Left atrium: The atrium was at the upper limits of normal in size. - Right atrium: Central venous pressure (est): 3 mm Hg. - Tricuspid valve: There was moderate regurgitation. - Pulmonary arteries: PA peak pressure: 61 mm Hg (S). - Pericardium, extracardiac: A small pericardial effusion was identified. There was a right pleural effusion. There was a left pleural effusion.  Cardiac Event Monitor 10/02/2015 Sinus rhythm with pacs and brief PAT     Patient Profile     83 y.o. female  with a hx of palpitations, hypertension, paroxysmal atrial fibrillation on Eliquis, , AI.  SVT, converted to normal sinus rhythm on metoprolol and diltiazem, COPD, hypothyroidism, recent treatment for C-diff, and SBO in 2018, pleural effusion requiring thoracentesis 08/29/2017.[name]  Who is being seen today for the evaluation of atrial fibrillation with diastolic CHF in the setting of small bowel obstruction  She has been transitioned to po diltiazem from IV   Assessment & Plan    1.Atrial fibrillation    Has been transitioned from IV diltiazem to p.o. diltiazem 60 mg every 6 hours which she was taking at home.  Heart rate is much  better controlled.  She continues on apixaban 2.5 mg twice daily for CVA prophylaxis.  2.  Acute on chronic diastolic CHF: She is on IV Lasix 40 mg daily,  With evidence of bilateral pulmonary edema on CXR 09/18/2017 will continue IV lasix.  Creatinine 0.91, BUN 21.  Potassium 3.7.Give one extra dose of lasix 20 mg today in addition to lasix 40 mg.  Will switch to 40 IV bid and follow  Daily wts and switch to low Na diet.  Strict I/O    3.  Status post left lower lung thoracentesis: Removal of 1.3 L.  .  Chest x-ray postthoracentesis showed persistent right pulmonary edema and right pleural effusion.  Repeat chest x-ray on 09/18/2017 revealed stable cardiomegaly with bilateral pulmonary edema and pleural effusions again noted. Will order repeat PA and Lat cXR later today    4. SBO: Resolved.  Signed, Phill Myron. [place] Pugh, ANP, AACC   09/19/2017, 8:39 AM    Pt seen and examined  I have amended note above by K Lawrence Pt still with evid of volume increase on exam  JVP increased  Lungs with rales and decreased BS at bases  Cardiac exam Irreg irreg  No S3  Ext without signif edema CXR yesterday with edema and effusions Tele with afib with intermittent tachycardia  I would switch lasix to 40 IV bid Strict I/O  2g NA diet I have also written to increase metoprolol to 50 bid for better HR control  Follow BP   Dorris Carnes.

## 2017-09-19 NOTE — Progress Notes (Signed)
PHARMACIST - PHYSICIAN COMMUNICATION  DR:   TRH  CONCERNING: IV to Oral Route Change Policy  RECOMMENDATION: This patient is receiving Pantoprazole / Synthroid by the intravenous route.  Based on criteria approved by the Pharmacy and Therapeutics Committee, the intravenous medication(s) is/are being converted to the equivalent oral dose form(s).   DESCRIPTION: These criteria include:  The patient is eating (either orally or via tube) and/or has been taking other orally administered medications for a least 24 hours  The patient has no evidence of active gastrointestinal bleeding or impaired GI absorption (gastrectomy, short bowel, patient on TNA or NPO).  If you have questions about this conversion, please contact the Pharmacy Department  _0   [phone] )  [facility] _1   [phone] )  [facility] _2   [phone] )  [facility] _3   [phone] )  [facility] _4   [phone] )  [place], [facility] 09/19/2017 12:30 PM

## 2017-09-20 ENCOUNTER — Inpatient Hospital Stay (HOSPITAL_COMMUNITY): Payer: Medicare Other

## 2017-09-20 LAB — BASIC METABOLIC PANEL
Anion gap: 13 (ref 5–15)
BUN: 18 mg/dL (ref 6–20)
CO2: 33 mmol/L — ABNORMAL HIGH (ref 22–32)
CREATININE: 0.91 mg/dL (ref 0.44–1.00)
Calcium: 8.3 mg/dL — ABNORMAL LOW (ref 8.9–10.3)
Chloride: 94 mmol/L — ABNORMAL LOW (ref 101–111)
GFR calc Af Amer: >60 mL/min (ref >60)
GFR, EST NON AFRICAN AMERICAN: 56 mL/min — AB (ref >60)
GLUCOSE: 147 mg/dL — AB (ref 65–99)
Potassium: 3.5 mmol/L (ref 3.5–5.1)
Sodium: 140 mmol/L (ref 135–145)

## 2017-09-20 LAB — CULTURE, BODY FLUID W GRAM STAIN -BOTTLE: Culture: NO GROWTH

## 2017-09-20 LAB — CULTURE, BODY FLUID-BOTTLE

## 2017-09-20 MED ORDER — FUROSEMIDE 10 MG/ML IJ SOLN
40.0000 mg | Freq: Once | INTRAMUSCULAR | Status: AC
  Administered 2017-09-20: 40 mg via INTRAVENOUS
  Filled 2017-09-20: qty 4

## 2017-09-20 NOTE — Progress Notes (Signed)
PROGRESS NOTE  Andrea [name] MKJ:[id number] DOB: July 06, 1929 DOA: 09/13/2017 PCP: Unk Pinto, MD  Brief History: 83 year old female with a history of bronchiectasis/COPD, chronic respiratory failure on 2 L at night, hypertension, hyperlipidemia, diastolic CHF, hypothyroidism, and PAT/PAFpresented with 4-5-day history of diarrhea and 1 day history of nausea and vomiting. The patient was recently discharged from the hospital after a stay from August 26, 2017 through September 01, 2017. During that hospital stay, the patient was treated for acute on chronic diastolic CHF as well as finishing treatment for C. difficile colitis with metronidazole.The last day of metronidazole was August 30, 2017. During that hospitalization, the patient also underwent a left-sided thoracocentesis. Fluid studies revealed that it was transudate of. The patient was discharged home with furosemide 20 mg daily. Unfortunately, the patient began having diarrhea 4-5 days prior to this admission. The patient went to see her primary care provider and C. difficile assay was positive again. The patient was restarted on metronidazole. However,the patient states that she was not taking it consistently. In addition, the patient intermittently does not take her cardiac medications including but not limited to metoprolol, diltiazem, and furosemide. The patient complains of dyspnea on exertion, but states that this is not much worse than usual. In addition, the patient states that her lower extremity edema is about the same as usual. She denies any hematochezia, hematemesis, fevers, chills, chest pain, headache, neck pain. Upon presentation, CT of the abdomen and pelvis revealed moderate bilateral pleural effusions, left greater than right. There was also a dilated fluid-filled small bowel loops up to 5.3 cm with a transition point in the mid to distal ileum concerning for bowel obstruction. Chest x-ray revealed  increased interstitial prominence with bilateral pleural effusions.    Assessment/Plan: Acute on chronic diastolic CHF -August 26, 2017 echo EF 60-65%, grade 2 DD, moderate TR, PASP 61 -daily weights--question accuracy -accurate I/O's--not accurate -fluid restrict -ContinueIV Lasix--decreased to once daily on 12/20 due to worsen renal function -cardiology consult appreciated -repeat CXR 12/20 personally reviewed, slight worsen pleural effusions and pulm edema -12/20--Left Thoracocentesis--> 1.3L, transudate -12/21--Right Thoracocentesis--1L, transudate -repeat CXR 12/23 shows stable cardiomegaly and bilateral pulmonary edema and pleural effusions.   -Still has a significant oxygen requirement and evidence of volume overload -Continue IV diuresis -Repeat chest x-ray today  Bilateral Pleural Effusions -transudate by fluid analysis -08/29/17--Left Thora--1L -09/15/17--Left Thora--1.3L -09/16/17--R-Thora--1L -Continue with IV diuresis  Small bowel obstruction -Likely secondary to C. difficile colitis the patient also had a small bowel obstruction July 2018 secondary to adhesions. -Appreciate surgical consultation-->nonoperative management for now -she is a poor surgical candidate -repeat abd xray--personally reviewed--No Air Fluid Levels, minimal SB dilatation -changed vancomycin to po 12/21 as bowel function returning  -Tolerating solid diet, passing flatus and still having bowel movements.  Recurrent C. difficile colitis--Severe -She is being treated with vancomycin enemas, p.o. vancomycin as well as IV metronidazole -Since overall bowel function has improved and she is able to tolerate p.o., will discontinue vancomycin enemas as well as metronidazole. -Continue on oral vancomycin, will likely need prolonged taper since this is a recurrent episode.  Atrial fibrillation with RVR/Atrial tachycardia -Briefly treated with Cardizem infusion, now on oral Cardizem -Heart  rate control appears to be improving today -Continue metoprolol 50 mg twice daily -TSH--4.511 -Anticoagulated with Eliquis  Acute on chronic respiratory failure with hypoxia -Secondary to CHFin the setting of bronchiectasis -09/14/17 evening-->increased oxygen demand to 12 L HFNC -12/25-->5L -Normally on  2 L nasal cannula at home -Wean oxygen back to 2 L for oxygen saturation greater than 92% -Repeat chest x-ray  Acute on chronic renal failure--CKD3 -baseline creatinine 0.8-1.1 -serum creatinine peaked 1.80 -12/20--Lasix IV decreased to once daily -Renal function appears to be stable/improving  COPD/bronchiectasis/chronic respiratory failure with hypoxia -On 2 L nasal cannula atnighttime at home -continueduonebs -pulmonary consult appreciated  Essential hypertension -Blood pressure stable on diltiazem and metoprolol  Anxiety -IV ativan q 8 hrs prn until able to take po xanax  Severe protein calorie malnutrition -Consult nutrition  Goals of Care -family has unrealistic expectations--becomes angry and wants to dismiss physicians/care team members when given any type of non-positive clinical information of any kind -they have dismissed the palliative medicine consultant -remains FULL CODE   Disposition Plan:Discharge home once improved Family Communication:No family at bedside   Consultants:General surgery; palliative med, cardiology, pulmonology  Code Status: FULL   DVT Prophylaxis: Eliquis   Procedures: As Listed in Progress Note Above  Antibiotics: Vanco po/Enema 12/19>>>  Subjective: Noted oxygen requirements increasing overnight.  Patient does not feel any more short of breath than yesterday.  No chest pain.  Objective: Vitals:   09/20/17 0742 09/20/17 0801 09/20/17 1440 09/20/17 1500  BP: 129/68   122/66  Pulse: 85   91  Resp: 20   19  Temp: 98.1 F (36.7 C)   98.3 F (36.8 C)  TempSrc: Oral   Oral  SpO2: 98% 98% 94%  94%  Weight:    51.8 kg (114 lb 3.2 oz)  Height:        Intake/Output Summary (Last 24 hours) at 09/20/2017 1518 Last data filed at 09/20/2017 1500 Gross per 24 hour  Intake 720 ml  Output 1600 ml  Net -880 ml   Weight change:  Exam:   General:  Pt is alert, follows commands appropriately, not in acute distress  HEENT: No icterus, No thrush, No neck mass, [place]/AT  Cardiovascular: IRRR, S1/S2, no rubs, no gallops  Respiratory: Diminished breath sounds at bases with crackles at bases bilaterally  Abdomen: Soft/+BS, non tender, non distended, no guarding  Extremities: 1+LE edema, No lymphangitis, No petechiae, No rashes, no synovitis   Data Reviewed: I have personally reviewed following labs and imaging studies Basic Metabolic Panel: Recent Labs  Lab 09/15/17 0407 09/16/17 0359 09/17/17 0438 09/18/17 0610 09/19/17 0540 09/19/17 2338  NA 140 139 140 140 143 140  K 4.1 4.0 4.2 3.4* 3.7 3.5  CL 99* 99* 99* 97* 97* 94*  CO2 _0 36* 33*  GLUCOSE 111* 147* 107* 115* 112* 147*  BUN 41* 43* 37* 28* 21* 18  CREATININE 1.80* 1.60* 1.20* 1.02* 0.91 0.91  CALCIUM 8.1* 8.2* 8.0* 8.2* 8.4* 8.3*  MG 1.8  --   --   --   --   --    Liver Function Tests: Recent Labs  Lab 09/13/17 1638 09/14/17 0404 09/16/17 0359  AST _1 ALT _2 ALKPHOS 70 54 59  BILITOT 0.8 0.6 0.4  PROT 7.8 6.4* 6.4*  ALBUMIN 3.2* 2.5* 2.5*   No results for input(s): LIPASE, AMYLASE in the last 168 hours. No results for input(s): AMMONIA in the last 168 hours. Coagulation Profile: No results for input(s): INR, PROTIME in the last 168 hours. CBC: Recent Labs  Lab 09/13/17 1638 09/14/17 0404 09/15/17 0407 09/16/17 0359 09/17/17 0438 09/18/17 0610  WBC 17.1* 13.1* 13.9* 11.9* 10.7* 9.4  NEUTROABS 14.4*  --   --   --   --   --  HGB 11.8* 10.4* 10.6* 10.3* 9.3* 10.1*  HCT 40.6 36.1 37.4 36.0 31.3* 33.9*  MCV 94.9 97.6 99.5 97.3 94.8 94.7  PLT 385 352 359 274 280 306    Cardiac Enzymes: Recent Labs  Lab 09/13/17 1638  TROPONINI <0.03   BNP: Invalid input(s): POCBNP CBG: No results for input(s): GLUCAP in the last 168 hours. HbA1C: No results for input(s): HGBA1C in the last 72 hours. Urine analysis:    Component Value Date/Time   COLORURINE YELLOW 09/13/2017 [place] 09/13/2017 1647   LABSPEC 1.010 09/13/2017 1647   PHURINE 6.0 09/13/2017 1647   GLUCOSEU NEGATIVE 09/13/2017 1647   HGBUR NEGATIVE 09/13/2017 [place] 09/13/2017 1647   KETONESUR NEGATIVE 09/13/2017 1647   PROTEINUR 30 (A) 09/13/2017 1647   UROBILINOGEN 0.2 08/20/2014 1613   NITRITE NEGATIVE 09/13/2017 1647   LEUKOCYTESUR TRACE (A) 09/13/2017 1647   Sepsis Labs: _0 (procalcitonin:4,lacticidven:4) ) Recent Results (from the past 240 hour(s))  Blood culture (routine x 2)     Status: None   Collection Time: 09/13/17  4:38 PM  Result Value Ref Range Status   Specimen Description BLOOD RIGHT FOREARM DRAWN BY RN  Final   Special Requests   Final    BOTTLES DRAWN AEROBIC AND ANAEROBIC Blood Culture adequate volume   Culture NO GROWTH 5 DAYS  Final   Report Status 09/18/2017 FINAL  Final  Blood culture (routine x 2)     Status: None   Collection Time: 09/13/17  4:50 PM  Result Value Ref Range Status   Specimen Description LEFT ANTECUBITAL  Final   Special Requests   Final    BOTTLES DRAWN AEROBIC AND ANAEROBIC Blood Culture adequate volume   Culture NO GROWTH 5 DAYS  Final   Report Status 09/18/2017 FINAL  Final  MRSA PCR Screening     Status: None   Collection Time: 09/14/17  2:20 PM  Result Value Ref Range Status   MRSA by PCR NEGATIVE NEGATIVE Final    Comment:        The GeneXpert MRSA Assay (FDA approved for NASAL specimens only), is one component of a comprehensive MRSA colonization surveillance program. It is not intended to diagnose MRSA infection nor to guide or monitor treatment for MRSA infections.   Gram  stain     Status: None   Collection Time: 09/15/17  3:30 PM  Result Value Ref Range Status   Specimen Description PLEURAL  Final   Special Requests NONE  Final   Gram Stain   Final    Performed at [place] PMN AND MONONUCLEAR NO ORGANISMS SEEN    Report Status 09/15/2017 FINAL  Final  Culture, body fluid-bottle     Status: None   Collection Time: 09/15/17  4:00 PM  Result Value Ref Range Status   Specimen Description ASCITIC  Final   Special Requests BOTTLES DRAWN AEROBIC AND ANAEROBIC 10CC  Final   Culture NO GROWTH 5 DAYS  Final   Report Status 09/20/2017 FINAL  Final     Scheduled Meds: . apixaban  2.5 mg Oral BID  . diltiazem  60 mg Oral Q6H  . furosemide  40 mg Intravenous Daily  . furosemide  40 mg Intravenous Once  . hydrocerin   Topical BID  . ipratropium  0.5 mg Nebulization TID  . levalbuterol  0.63 mg Nebulization TID  . levothyroxine  50 mcg Oral QAC breakfast  . mouth rinse  15  mL Mouth Rinse BID  . metoprolol tartrate  50 mg Oral BID  . nystatin  5 mL Oral Q6H  . pantoprazole  40 mg Oral Daily  . vancomycin  125 mg Oral Q6H   Continuous Infusions:   Procedures/Studies: Dg Chest 1 View  Result Date: 08/29/2017 INDICATION: Pleural effusions resistant to diuresis. EXAM: ULTRASOUND GUIDED LEFT THORACENTESIS MEDICATIONS: None. COMPLICATIONS: None immediate. PROCEDURE: An ultrasound guided thoracentesis was thoroughly discussed with the patient and questions answered. The benefits, risks, alternatives and complications were also discussed. The patient is at increased bleeding risk due to Eliquis use and we had a lengthy discussion about this; the patient's husband was also called on the telephone. The patient understands and wishes to proceed with the procedure. Written consent was obtained. Ultrasound was performed to localize and mark an adequate pocket of fluid in the left chest. The area was then prepped and draped in  the normal sterile fashion. 1% Lidocaine was used for local anesthesia. Under ultrasound guidance a 19 gauge, 7-cm, Yueh catheter was introduced. Thoracentesis was performed until the available fluid was drained. I scanned the left chest and there was no evidence of bleeding. The catheter was removed and a dressing applied. FINDINGS: A total of approximately 1 L of amber fluid was removed. Samples were sent to the laboratory as requested by the clinical team. A chest x-ray was obtained and shows significant decrease in left pleural effusion, with small volume fluid remaining. No visible pneumothorax. There is a moderate right pleural effusion. Cardiomegaly and vascular congestion. IMPRESSION: 1. Successful ultrasound guided left thoracentesis yielding 1 L of pleural fluid. 2. Minimal fluid remaining on the left. Right pleural effusion is moderate. Electronically Signed   By: Monte Fantasia M.D.   On: 08/29/2017 11:35   Dg Chest 2 View  Result Date: 09/01/2017 CLINICAL DATA:  Pleural effusions EXAM: CHEST  2 VIEW COMPARISON:  08/30/2017 CXR FINDINGS: Stable cardiac silhouette partially obscured by bilateral pleural effusions. Aortic atherosclerosis is noted. Decrease in size of small right pleural effusion with slight increase in left-sided pleural effusion. Adjacent bibasilar atelectasis. Interval decrease in pulmonary vascular redistribution. Osteoarthritis of the [facility] and glenohumeral joint on the right with high-riding right humeral head and remodeling of the undersurface of the acromion. Findings likely reflect chronic rotator cuff tears. IMPRESSION: 1. Slight decrease in right small pleural effusion and minimal increase in small left pleural effusion since prior. 2. Interval decrease in CHF. 3. Aortic atherosclerosis. Electronically Signed   By: Ashley Royalty M.D.   On: 09/01/2017 14:28   Dg Chest 2 View  Result Date: 08/26/2017 CLINICAL DATA:  Patient having increased SOB over the past few days. Patient  is on oxygen at home and states that she usually only wears it at night but has been wearing it all day since having SOB. Hx of pna, htn-controlled with medication, COPD. Ex-smoker. EXAM: CHEST  2 VIEW COMPARISON:  06/28/2017 FINDINGS: The lungs are hyperinflated likely secondary to COPD. There are moderate bilateral pleural effusions with bibasilar atelectasis. There is bilateral diffuse interstitial thickening. There is no pneumothorax. Stable cardiomediastinal silhouette. There is no acute osseous abnormality. There is severe osteoarthritis of the right glenohumeral joint. There is mild osteoarthritis of the left glenohumeral joint. IMPRESSION: 1. Findings consistent with CHF. Electronically Signed   By: Kathreen Devoid   On: 08/26/2017 13:36   Ct Abdomen Pelvis W Contrast  Result Date: 09/13/2017 CLINICAL DATA:  Abdominal distension with vomiting and diarrhea. Shortness of breath. EXAM:  CT ABDOMEN AND PELVIS WITH CONTRAST TECHNIQUE: Multidetector CT imaging of the abdomen and pelvis was performed using the standard protocol following bolus administration of intravenous contrast. CONTRAST:  143m ISOVUE-300 IOPAMIDOL (ISOVUE-300) INJECTION 61% COMPARISON:  08/19/2017 FINDINGS: Lower chest: Emphysema. Persistent bibasilar airspace disease. Mild cardiomegaly with LAD coronary artery atherosclerosis. Moderate bilateral pleural effusions, larger on the left. Similar. Hepatobiliary: Perfusion anomaly involving the anterior left hepatic lobe image 33/ series 2. Normal gallbladder, without biliary ductal dilatation. Pancreas: Normal, without mass or ductal dilatation. Spleen: Normal in size, without focal abnormality. Adrenals/Urinary Tract: Normal right adrenal gland. Minimal left adrenal thickening and nodularity. Bilateral renal cortical thinning. Right renal lesions are favored to represent cysts or minimally complex cysts. Bilateral too small to characterize renal lesions. No hydronephrosis. Normal urinary  bladder. Stomach/Bowel: Normal stomach, without wall thickening. Scattered colonic diverticula. The colon is relatively decompressed. Small bowel loops are fluid-filled and dilated, including at up to 5.3 cm. A transition point is identified within the mid to distal ileum, including on images 73 through 77/series 2. No bowel wall thickening to suggest complicating ischemia. No pneumatosis or free intraperitoneal air. Vascular/Lymphatic: Aortic and branch vessel atherosclerosis. No abdominopelvic adenopathy. Reproductive: Hysterectomy. Other: Small volume cul-de-sac fluid including on 77/series 2. Pelvic floor laxity. Musculoskeletal: Osteopenia.  Convex right lumbar spine curvature. IMPRESSION: 1. High-grade partial small bowel obstruction involving the mid to distal ileum. Likely due to adhesions. Although there is adjacent ascites, no specific evidence of complicating ischemia identified. 2. Bilateral pleural effusions and bibasilar atelectasis, as before. 3. Coronary artery atherosclerosis. Aortic Atherosclerosis (ICD10-I70.0). 4. Pelvic floor laxity. Electronically Signed   By: KAbigail MiyamotoM.D.   On: 09/13/2017 20:47   Dg Chest Port 1 View  Result Date: 09/18/2017 CLINICAL DATA:  Bilateral pleural effusions. EXAM: PORTABLE CHEST 1 VIEW COMPARISON:  Radiographs of September 16, 2017. FINDINGS: Stable cardiomegaly and central pulmonary vascular congestion is noted. No pneumothorax is noted. Stable diffuse interstitial densities are noted throughout both lungs concerning for pulmonary edema or inflammation. Bilateral pleural effusions are noted, with left greater than right. Probable underlying atelectasis is noted. Degenerative changes seen involving both glenohumeral joints. IMPRESSION: Stable cardiomegaly and bilateral pulmonary edema and pleural effusions is again noted. Electronically Signed   By: JMarijo Conception M.D.   On: 09/18/2017 09:08   Dg Chest Port 1 View  Result Date: 09/16/2017 CLINICAL  DATA:  RIGHT pleural effusion post thoracentesis, CHF EXAM: PORTABLE CHEST 1 VIEW COMPARISON:  Portable exam 16948hours compared to 09/15/2017 FINDINGS: Small bibasilar pleural effusions and atelectasis. Persistent pulmonary edema increased on LEFT since prior exam. No pneumothorax following RIGHT thoracentesis. Persistent enlargement of cardiac silhouette with pulmonary vascular congestion. Atherosclerotic calcification aorta. Bones demineralized with BILATERAL chronic rotator cuff tears. IMPRESSION: No pneumothorax following RIGHT thoracentesis. Persistent pulmonary edema increased since previous exam with residual bibasilar effusions and atelectasis. Electronically Signed   By: MLavonia DanaM.D.   On: 09/16/2017 13:24   Dg Chest Port 1 View  Result Date: 09/15/2017 CLINICAL DATA:  Small bowel obstruction EXAM: PORTABLE CHEST 1 VIEW COMPARISON:  Portable exam 1324 hours compared to 09/13/2017 FINDINGS: Enlargement of cardiac silhouette with pulmonary vascular congestion. Diffuse pulmonary edema consistent with CHF. Associated bibasilar pleural effusions and atelectasis slightly greater on LEFT, appear progressive since prior study. No pneumothorax. Bones demineralized IMPRESSION: CHF with diffuse pulmonary edema, bibasilar effusions and atelectasis. BILATERAL pleural effusions and basilar atelectasis appear mildly progressive since previous exam Electronically Signed   By: MElta [place]  Thornton Papas M.D.   On: 09/15/2017 14:54   Dg Chest Portable 1 View  Result Date: 09/13/2017 CLINICAL DATA:  Shortness of breath.  Vomiting and diarrhea. EXAM: PORTABLE CHEST 1 VIEW COMPARISON:  09/01/2017 FINDINGS: Small to moderate bilateral pleural effusions, increased. Diffuse interstitial and airspace opacity, increased. Probable cardiomegaly, although partially obscured. No pneumothorax. IMPRESSION: CHF pattern including small to moderate pleural effusions. Electronically Signed   By: Monte Fantasia M.D.   On: 09/13/2017 17:53    Dg Chest Port 1 View  Result Date: 08/30/2017 CLINICAL DATA:  COPD.  Pleural effusion. EXAM: PORTABLE CHEST 1 VIEW COMPARISON:  08/29/2017. FINDINGS: Cardiomegaly with pulmonary venous congestion and bilateral interstitial prominence with bilateral pleural effusions again noted. Findings consistent with CHF. No significant change from prior study of 08/29/2017. No evidence of pneumothorax status post recent thoracentesis. IMPRESSION: 1. Persistent cardiomegaly with pulmonary venous congestion, bilateral interstitial prominence and bilateral pleural effusions again noted without significant change. Findings consistent CHF. 2. No evidence of progression of pleural effusions. No pneumothorax status post recent left thoracentesis. Electronically Signed   By: Marcello Moores  Register   On: 08/30/2017 08:43   Dg Chest Port 1 View  Result Date: 08/28/2017 CLINICAL DATA:  Hypoxemia, bilateral pleural effusions EXAM: PORTABLE CHEST 1 VIEW COMPARISON:  08/27/2017 FINDINGS: Moderate to large bilateral pleural effusions. Diffuse bilateral airspace disease again noted. No real change since prior study. Cardiomegaly. IMPRESSION: Severe diffuse bilateral airspace disease and a large effusions. No real change. Electronically Signed   By: Rolm Baptise M.D.   On: 08/28/2017 10:27   Dg Chest Port 1 View  Result Date: 08/27/2017 CLINICAL DATA:  Pleural effusion. EXAM: PORTABLE CHEST 1 VIEW COMPARISON:  08/26/2017 FINDINGS: Moderate bilateral pleural effusions. Diffuse bilateral airspace disease, likely edema/ CHF. Cardiomegaly. Slight interval worsening edema since prior study. IMPRESSION: Worsening aeration of the lungs compatible with worsening edema/ CHF. Moderate bilateral effusions are stable. Electronically Signed   By: Rolm Baptise M.D.   On: 08/27/2017 13:19   Dg Chest Port 1v Same Day  Result Date: 09/15/2017 CLINICAL DATA:  CHF, BILATERAL pleural effusions post LEFT thoracentesis EXAM: PORTABLE CHEST 1 VIEW  COMPARISON:  Portable exam 1615 hours compared to earlier study of at 1324 hours FINDINGS: Enlargement of cardiac silhouette with pulmonary vascular congestion. Slightly decreased pulmonary edema in LEFT lung. Decreased LEFT pleural effusion and basilar atelectasis post thoracentesis. No pneumothorax. Persistent RIGHT pulmonary edema, RIGHT pleural effusion and basilar atelectasis. Biapical scarring. Bones demineralized. IMPRESSION: No pneumothorax following LEFT thoracentesis. Electronically Signed   By: Lavonia Dana M.D.   On: 09/15/2017 16:40   Dg Abd 2 Views  Result Date: 09/15/2017 CLINICAL DATA:  Bowel obstruction EXAM: ABDOMEN - 2 VIEW COMPARISON:  September 14, 2017 FINDINGS: Supine and upright images were obtained. There are loops of mildly dilated small bowel in the lower abdomen and pelvis, similar to 1 day prior. There are foci of contrast in the colon. There are scattered colonic diverticula. No evident free air. There is contrast in the urinary bladder. There is lumbar scoliosis. IMPRESSION: Contrast is seen in the colon. There are scattered colonic diverticula. Mild dilatation of several small bowel loops, similar to 1 day prior. No air-fluid levels appreciable. No free air. Electronically Signed   By: Lowella Grip III M.D.   On: 09/15/2017 22:59   Dg Abd 2 Views  Result Date: 09/14/2017 CLINICAL DATA:  Small-bowel obstruction EXAM: ABDOMEN - 2 VIEW COMPARISON:  CT abdomen 09/13/2017 FINDINGS: Dilated small bowel loops in  the pelvis similar to improved from the prior study. Contrast in the renal collecting system bilaterally from recent CT. No renal obstruction. Moderate lumbar scoliosis.  No acute skeletal abnormality. IMPRESSION: Small bowel dilatation similar to slightly improved from yesterday. Electronically Signed   By: Franchot Gallo M.D.   On: 09/14/2017 07:48   US Thoracentesis Asp Pleural Space W/img Guide  Result Date: 09/16/2017 INDICATION: CHF, BILATERAL pleural  effusions, prior LEFT thoracentesis, for RIGHT thoracentesis TODAY EXAM: ULTRASOUND GUIDED DIAGNOSTIC AND THERAPEUTIC RIGHT THORACENTESIS MEDICATIONS: None. COMPLICATIONS: None immediate. PROCEDURE: Procedure, benefits, and risks of procedure were discussed with patient. Written informed consent for procedure was obtained. Time out protocol followed. Pleural effusion localized by ultrasound at the posterior RIGHT hemithorax. Skin prepped and draped in usual sterile fashion. Skin and soft tissues anesthetized with 10 mL of 1% lidocaine. 8 French thoracentesis catheter placed into the RIGHT pleural space. 1 L of amber colored RIGHT pleural fluid aspirated by vacuum bottle. Procedure tolerated well by patient without immediate complication. FINDINGS: A total of approximately 1 L of RIGHT pleural fluid was removed. Samples were sent to the laboratory as requested by the clinical team. IMPRESSION: Successful ultrasound guided RIGHT thoracentesis yielding 1 of pleural fluid. Electronically Signed   By: Lavonia Dana M.D.   On: 09/16/2017 13:28   US Thoracentesis Asp Pleural Space W/img Guide  Result Date: 09/15/2017 INDICATION: CHF, BILATERAL pleural effusions EXAM: ULTRASOUND GUIDED DIAGNOSTIC AND THERAPEUTIC LEFT THORACENTESIS MEDICATIONS: None. COMPLICATIONS: None immediate. PROCEDURE: Procedure, benefits, and risks of procedure were discussed with patient. Written informed consent for procedure was obtained. Time out protocol followed. Pleural effusion localized by ultrasound at the posterior LEFT hemithorax. Skin prepped and draped in usual sterile fashion. Skin and soft tissues anesthetized with 10 mL of 1% lidocaine. 8 French thoracentesis catheter placed into the LEFT pleural space. 1.3 L of amber colored LEFT pleural fluid was aspirated by syringe pump. Procedure tolerated well by patient without immediate complication. FINDINGS: A total of approximately 1.3 L of LEFT pleural fluid was removed. Samples were  sent to the laboratory as requested by the clinical team. IMPRESSION: Successful ultrasound guided LEFT thoracentesis yielding 1.3 of pleural fluid. Electronically Signed   By: Lavonia Dana M.D.   On: 09/15/2017 16:34   US Thoracentesis Asp Pleural Space W/img Guide  Result Date: 08/29/2017 INDICATION: Pleural effusions resistant to diuresis. EXAM: ULTRASOUND GUIDED LEFT THORACENTESIS MEDICATIONS: None. COMPLICATIONS: None immediate. PROCEDURE: An ultrasound guided thoracentesis was thoroughly discussed with the patient and questions answered. The benefits, risks, alternatives and complications were also discussed. The patient is at increased bleeding risk due to Eliquis use and we had a lengthy discussion about this; the patient's husband was also called on the telephone. The patient understands and wishes to proceed with the procedure. Written consent was obtained. Ultrasound was performed to localize and mark an adequate pocket of fluid in the left chest. The area was then prepped and draped in the normal sterile fashion. 1% Lidocaine was used for local anesthesia. Under ultrasound guidance a 19 gauge, 7-cm, Yueh catheter was introduced. Thoracentesis was performed until the available fluid was drained. I scanned the left chest and there was no evidence of bleeding. The catheter was removed and a dressing applied. FINDINGS: A total of approximately 1 L of amber fluid was removed. Samples were sent to the laboratory as requested by the clinical team. A chest x-ray was obtained and shows significant decrease in left pleural effusion, with small volume fluid remaining. No  visible pneumothorax. There is a moderate right pleural effusion. Cardiomegaly and vascular congestion. IMPRESSION: 1. Successful ultrasound guided left thoracentesis yielding 1 L of pleural fluid. 2. Minimal fluid remaining on the left. Right pleural effusion is moderate. Electronically Signed   By: Monte Fantasia M.D.   On: 08/29/2017 11:35     Kathie Dike, MD  [facility] Pager [phone]  If 7PM-7AM, please contact night-coverage [email or web address] Password [email or web address] 09/20/2017, 3:18 PM   LOS: 7 days

## 2017-09-21 ENCOUNTER — Inpatient Hospital Stay (HOSPITAL_COMMUNITY): Payer: Medicare Other

## 2017-09-21 ENCOUNTER — Other Ambulatory Visit: Payer: Self-pay | Admitting: *Deleted

## 2017-09-21 DIAGNOSIS — Z79899 Other long term (current) drug therapy: Secondary | ICD-10-CM

## 2017-09-21 LAB — BASIC METABOLIC PANEL
Anion gap: 12 (ref 5–15)
BUN: 17 mg/dL (ref 6–20)
CO2: 37 mmol/L — ABNORMAL HIGH (ref 22–32)
Calcium: 8.3 mg/dL — ABNORMAL LOW (ref 8.9–10.3)
Chloride: 92 mmol/L — ABNORMAL LOW (ref 101–111)
Creatinine, Ser: 0.9 mg/dL (ref 0.44–1.00)
GFR calc Af Amer: >60 mL/min (ref >60)
GFR, EST NON AFRICAN AMERICAN: 56 mL/min — AB (ref >60)
GLUCOSE: 120 mg/dL — AB (ref 65–99)
POTASSIUM: 3.4 mmol/L — AB (ref 3.5–5.1)
Sodium: 141 mmol/L (ref 135–145)

## 2017-09-21 MED ORDER — METOPROLOL TARTRATE 50 MG PO TABS: 50.0000 mg | ORAL_TABLET | Freq: Two times a day (BID) | ORAL | 0 refills | Status: DC

## 2017-09-21 MED ORDER — BISACODYL 10 MG RE SUPP
10.0000 mg | Freq: Once | RECTAL | Status: AC
  Administered 2017-09-21: 10 mg via RECTAL
  Filled 2017-09-21: qty 1

## 2017-09-21 MED ORDER — FUROSEMIDE 40 MG PO TABS: 40.0000 mg | ORAL_TABLET | Freq: Two times a day (BID) | ORAL | 0 refills | Status: DC

## 2017-09-21 MED ORDER — VANCOMYCIN 50 MG/ML ORAL SOLUTION: ORAL | 0 refills | Status: DC

## 2017-09-21 MED ORDER — NYSTATIN 100000 UNIT/ML MT SUSP: 5.0000 mL | Freq: Four times a day (QID) | OROMUCOSAL | 0 refills | Status: DC

## 2017-09-21 NOTE — Care Management Important Message (Signed)
Important Message  Patient Details  Name: Andrea [name] MRN: [id number] Date of Birth: [birth year]   Medicare Important Message Given:  Yes    Sherald Barge, RN 09/21/2017, 1:55 PM

## 2017-09-21 NOTE — Discharge Summary (Signed)
Physician Discharge Summary  Andrea [name] VQQ:[id number] DOB: Feb 16, 1929 DOA: 09/13/2017  PCP: Unk Pinto, MD  Admit date: 09/13/2017 Discharge date: 09/21/2017  Admitted From: Home Disposition: Home  Recommendations for Outpatient Follow-up:  1. Follow up with PCP in 1-2 weeks 2. Please obtain BMP/CBC in one week 3. Follow-up with cardiology in the next 1-2 weeks 4. Repeat chest x-ray in 3-4 weeks to ensure resolution of pleural effusions  Home Health: Home health RN Equipment/Devices:  Discharge Condition: Stable CODE STATUS: Full code Diet recommendation: Heart Healthy  Brief/Interim Summary: 83 year old female with a history of bronchiectasis/COPD, chronic respiratory failure on 2 L at night, hypertension, hyperlipidemia, diastolic CHF, hypothyroidism, and PAT/PAFpresented with 4-5-day history of diarrhea and 1 day history of nausea and vomiting. The patient was recently discharged from the hospital after a stay from August 26, 2017 through September 01, 2017. During that hospital stay, the patient was treated for acute on chronic diastolic CHF as well as finishing treatment for C. difficile colitis with metronidazole.The last day of metronidazole was August 30, 2017. During that hospitalization, the patient also underwent a left-sided thoracocentesis. Fluid studies revealed that it was transudate of. The patient was discharged home with furosemide 20 mg daily. Unfortunately, the patient began having diarrhea 4-5 days prior to this admission. The patient went to see her primary care provider and C. difficile assay was positive again. The patient was restarted on metronidazole. However,the patient states that she was not taking it consistently. In addition, the patient intermittently does not take her cardiac medications including but not limited to metoprolol, diltiazem, and furosemide. The patient complains of dyspnea on exertion, but states that this is not much  worse than usual. In addition, the patient states that her lower extremity edema is about the same as usual. She denies any hematochezia, hematemesis, fevers, chills, chest pain, headache, neck pain. Upon presentation, CT of the abdomen and pelvis revealed moderate bilateral pleural effusions, left greater than right. There was also a dilated fluid-filled small bowel loops up to 5.3 cm with a transition point in the mid to distal ileum concerning for bowel obstruction. Chest x-ray revealed increased interstitial prominence with bilateral pleural effusions    Discharge Diagnoses:  Active Problems:   Bilateral pleural effusion   Acute on chronic respiratory failure with hypoxia (HCC)   AF (paroxysmal atrial fibrillation) (HCC)   Protein-calorie malnutrition, severe (HCC)   Acute on chronic diastolic CHF (congestive heart failure) (HCC)   SBO (small bowel obstruction) (HCC)   C. difficile colitis   Bronchiectasis ([place])   Congestive heart failure ([place])   Palliative care encounter   Goals of care, counseling/discussion   DNR (do not resuscitate) discussion   Recurrent left pleural effusion   S/P thoracentesis  Acute on chronic diastolic CHF -August 26, 2017 echo EF 60-65%, grade 2 DD, moderate TR, PASP 61 -fluid restrict -Treated withIV Lasix and volume status has significantly improved -cardiology consult appreciated -repeat CXR12/20 showed slight worsen pleural effusions and pulm edema -12/20--Left Thoracocentesis--> 1.3L, transudate -12/21--Right Thoracocentesis--1L, transudate -repeat CXR 12/23 shows stable cardiomegaly and bilateral pulmonary edema and pleural effusions.   -Respiratory status has improved and she is down to her baseline oxygen requirement -Lasix transition to 40 mg p.o. twice daily -Discharge weight is 112 pounds -She will follow-up with cardiology in the next 1-2 weeks   Bilateral Pleural Effusions -transudate by fluid analysis -08/29/17--Left  Thora--1L -09/15/17--Left Thora--1.3L -09/16/17--R-Thora--1L   Small bowel obstruction -Likely secondary to C. difficile colitis the patient  also had a small bowel obstruction July 2018 secondary to adhesions. -Appreciate surgical consultation-->nonoperative management for now -she is a poor surgical candidate -repeat abd xray-No Air Fluid Levels, minimal SB dilatation -changed vancomycin to po 12/21 as bowel function returning  -Tolerating solid diet, passing flatus and still having bowel movements.  Recurrent C. difficile colitis--Severe -She was initially treated with vancomycin enemas, p.o. vancomycin as well as IV metronidazole -When overall bowel function improved and she is able to tolerate p.o., metronidazole and vancomycin enemas were discontinued. -Continue on oral vancomycin, she will be discharged on prolonged taper since this is a recurrent episode.  Atrial fibrillation with RVR/Atrial tachycardia -Briefly treated with Cardizem infusion, now on oral Cardizem -Heart rate control appears to be improving today -Continue metoprolol 50 mg twice daily -TSH--4.511 -Anticoagulated with Eliquis  Acute on chronic respiratory failure with hypoxia -Secondary to CHFin the setting of bronchiectasis -09/14/17 evening-->increased oxygen demand to 12 L HFNC -This was weaned back on 2 L on discharge -Normally on 2 L nasal cannula at home  Acute on chronic renal failure--CKD3 -baseline creatinine 0.8-1.1 -serum creatinine peaked 1.80 -12/20--Lasix IV decreased to once daily -Renal function appears to be stable/improving  COPD/bronchiectasis/chronic respiratory failure with hypoxia -On 2 L nasal cannula atnighttime at home -continueduonebs -pulmonary consult appreciated  Essential hypertension -Blood pressure stable on diltiazem and metoprolol  Anxiety -Treated with benzodiazepines and remained stable  Severe protein calorie malnutrition -Consult  nutrition  Goals of Care -Seen by palliative care to discuss goals of care.  Family/patient did not appear receptive to this.  They wish to continue full code for now.    Discharge Instructions  Discharge Instructions    Diet - low sodium heart healthy   Complete by:  As directed    Increase activity slowly   Complete by:  As directed      Allergies as of 09/21/2017      Reactions   Vancomycin    Worsening kidney function   Sulfa Antibiotics Palpitations   Unknown      Medication List    STOP taking these medications   metroNIDAZOLE 500 MG tablet Commonly known as:  FLAGYL     TAKE these medications   ALPRAZolam 0.25 MG tablet Commonly known as:  XANAX Take 0.125-0.25 mg by mouth at bedtime.   apixaban 2.5 MG Tabs tablet Commonly known as:  ELIQUIS Take 1 tablet (2.5 mg total) by mouth 2 (two) times daily.   diltiazem 60 MG tablet Commonly known as:  CARDIZEM Take 60 mg by mouth 4 (four) times daily.   furosemide 40 MG tablet Commonly known as:  LASIX Take 1 tablet (40 mg total) by mouth 2 (two) times daily. What changed:    medication strength  how much to take  when to take this   levothyroxine 50 MCG tablet Commonly known as:  SYNTHROID, LEVOTHROID TAKE ONE TABLET BY MOUTH ONCE A DAY AS DIRECTED   metoprolol tartrate 50 MG tablet Commonly known as:  LOPRESSOR Take 1 tablet (50 mg total) by mouth 2 (two) times daily. What changed:    medication strength  how much to take   nystatin 100000 UNIT/ML suspension Commonly known as:  MYCOSTATIN Take 5 mLs (500,000 Units total) by mouth every 6 (six) hours.   PROBIOTIC-10 Chew Chew 2 tablets by mouth daily.   silver sulfADIAZINE 1 % cream Commonly known as:  SILVADENE Apply to affected area daily   traMADol 50 MG tablet Commonly known as:  Veatrice Bourbon  Take 1 tablet (50 mg total) by mouth every 6 (six) hours as needed.   vancomycin 50 mg/mL oral solution Commonly known as:  VANCOCIN Take 1102m  po QIDx7 days then 1231mBIDx7 days then 12547mailyx7 days then 125m37mery other day x 7days then 125mg105mays x7 days      Follow-up Information    StradAhmed PrimattFransisco HertzC. Go on 10/07/2017.   Specialties:  Physician Assistant, Cardiology Why:  at 2:30 pm Contact information: [phone]     Allergies  Allergen Reactions  . Vancomycin     Worsening kidney function  . Sulfa Antibiotics Palpitations    Unknown    Consultations:  Cardiology  General surgery  Pulmonology  Palliative care   Procedures/Studies: Dg Chest 1 View  Result Date: 08/29/2017 INDICATION: Pleural effusions resistant to diuresis. EXAM: ULTRASOUND GUIDED LEFT THORACENTESIS MEDICATIONS: None. COMPLICATIONS: None immediate. PROCEDURE: An ultrasound guided thoracentesis was thoroughly discussed with the patient and questions answered. The benefits, risks, alternatives and complications were also discussed. The patient is at increased bleeding risk due to Eliquis use and we had a lengthy discussion about this; the patient's husband was also called on the telephone. The patient understands and wishes to proceed with the procedure. Written consent was obtained. Ultrasound was performed to localize and mark an adequate pocket of fluid in the left chest. The area was then prepped and draped in the normal sterile fashion. 1% Lidocaine was used for local anesthesia. Under ultrasound guidance a 19 gauge, 7-cm, Yueh catheter was introduced. Thoracentesis was performed until the available fluid was drained. I scanned the left chest and there was no evidence of bleeding. The catheter was removed and a dressing applied. FINDINGS: A total of approximately 1 L of amber fluid was removed. Samples were sent to the laboratory as requested by the clinical team. A chest x-ray was obtained and shows significant decrease in left pleural effusion, with small volume fluid remaining. No  visible pneumothorax. There is a moderate right pleural effusion. Cardiomegaly and vascular congestion. IMPRESSION: 1. Successful ultrasound guided left thoracentesis yielding 1 L of pleural fluid. 2. Minimal fluid remaining on the left. Right pleural effusion is moderate. Electronically Signed   By: JonatMonte Fantasia   On: 08/29/2017 11:35   Dg Chest 2 View  Result Date: 09/20/2017 CLINICAL DATA:  Shortness of breath EXAM: CHEST  2 VIEW COMPARISON:  Two days ago FINDINGS: Moderate presumably layering pleural effusions. Pulmonary edema seen previously is improved/resolved. Much of the lower lobes are obscured, as is the lower heart. Stable visible mediastinal contours. No air bronchograms. No pneumothorax. Chronic right rotator cuff tear with high humeral head IMPRESSION: 1. Moderate bilateral pleural effusion obscuring the lower chest. 2. Pulmonary edema seen 2 days ago is improved/resolved. Electronically Signed   By: JonatMonte Fantasia   On: 09/20/2017 16:15   Dg Chest 2 View  Result Date: 09/01/2017 CLINICAL DATA:  Pleural effusions EXAM: CHEST  2 VIEW COMPARISON:  08/30/2017 CXR FINDINGS: Stable cardiac silhouette partially obscured by bilateral pleural effusions. Aortic atherosclerosis is noted. Decrease in size of small right pleural effusion with slight increase in left-sided pleural effusion. Adjacent bibasilar atelectasis. Interval decrease in pulmonary vascular redistribution. Osteoarthritis of the AC anOhio[facility] Hospitalglen[facility]umeral joint on the right with high-riding right humeral head and remodeling of the undersurface of the acromion. Findings likely reflect chronic rotator cuff tears. IMPRESSION: 1. Slight decrease in right small  pleural effusion and minimal increase in small left pleural effusion since prior. 2. Interval decrease in CHF. 3. Aortic atherosclerosis. Electronically Signed   By: Ashley Royalty M.D.   On: 09/01/2017 14:28   Dg Chest 2 View  Result Date: 08/26/2017 CLINICAL DATA:  Patient  having increased SOB over the past few days. Patient is on oxygen at home and states that she usually only wears it at night but has been wearing it all day since having SOB. Hx of pna, htn-controlled with medication, COPD. Ex-smoker. EXAM: CHEST  2 VIEW COMPARISON:  06/28/2017 FINDINGS: The lungs are hyperinflated likely secondary to COPD. There are moderate bilateral pleural effusions with bibasilar atelectasis. There is bilateral diffuse interstitial thickening. There is no pneumothorax. Stable cardiomediastinal silhouette. There is no acute osseous abnormality. There is severe osteoarthritis of the right glenohumeral joint. There is mild osteoarthritis of the left glenohumeral joint. IMPRESSION: 1. Findings consistent with CHF. Electronically Signed   By: Kathreen Devoid   On: 08/26/2017 13:36   Ct Abdomen Pelvis W Contrast  Result Date: 09/13/2017 CLINICAL DATA:  Abdominal distension with vomiting and diarrhea. Shortness of breath. EXAM: CT ABDOMEN AND PELVIS WITH CONTRAST TECHNIQUE: Multidetector CT imaging of the abdomen and pelvis was performed using the standard protocol following bolus administration of intravenous contrast. CONTRAST:  172m ISOVUE-300 IOPAMIDOL (ISOVUE-300) INJECTION 61% COMPARISON:  08/19/2017 FINDINGS: Lower chest: Emphysema. Persistent bibasilar airspace disease. Mild cardiomegaly with LAD coronary artery atherosclerosis. Moderate bilateral pleural effusions, larger on the left. Similar. Hepatobiliary: Perfusion anomaly involving the anterior left hepatic lobe image 33/ series 2. Normal gallbladder, without biliary ductal dilatation. Pancreas: Normal, without mass or ductal dilatation. Spleen: Normal in size, without focal abnormality. Adrenals/Urinary Tract: Normal right adrenal gland. Minimal left adrenal thickening and nodularity. Bilateral renal cortical thinning. Right renal lesions are favored to represent cysts or minimally complex cysts. Bilateral too small to characterize  renal lesions. No hydronephrosis. Normal urinary bladder. Stomach/Bowel: Normal stomach, without wall thickening. Scattered colonic diverticula. The colon is relatively decompressed. Small bowel loops are fluid-filled and dilated, including at up to 5.3 cm. A transition point is identified within the mid to distal ileum, including on images 73 through 77/series 2. No bowel wall thickening to suggest complicating ischemia. No pneumatosis or free intraperitoneal air. Vascular/Lymphatic: Aortic and branch vessel atherosclerosis. No abdominopelvic adenopathy. Reproductive: Hysterectomy. Other: Small volume cul-de-sac fluid including on 77/series 2. Pelvic floor laxity. Musculoskeletal: Osteopenia.  Convex right lumbar spine curvature. IMPRESSION: 1. High-grade partial small bowel obstruction involving the mid to distal ileum. Likely due to adhesions. Although there is adjacent ascites, no specific evidence of complicating ischemia identified. 2. Bilateral pleural effusions and bibasilar atelectasis, as before. 3. Coronary artery atherosclerosis. Aortic Atherosclerosis (ICD10-I70.0). 4. Pelvic floor laxity. Electronically Signed   By: KAbigail MiyamotoM.D.   On: 09/13/2017 20:47   Dg Chest Port 1 View  Result Date: 09/18/2017 CLINICAL DATA:  Bilateral pleural effusions. EXAM: PORTABLE CHEST 1 VIEW COMPARISON:  Radiographs of September 16, 2017. FINDINGS: Stable cardiomegaly and central pulmonary vascular congestion is noted. No pneumothorax is noted. Stable diffuse interstitial densities are noted throughout both lungs concerning for pulmonary edema or inflammation. Bilateral pleural effusions are noted, with left greater than right. Probable underlying atelectasis is noted. Degenerative changes seen involving both glenohumeral joints. IMPRESSION: Stable cardiomegaly and bilateral pulmonary edema and pleural effusions is again noted. Electronically Signed   By: JMarijo Conception M.D.   On: 09/18/2017 09:08   [facility]  [facility]  Result Date: 09/16/2017 CLINICAL DATA:  RIGHT pleural effusion post thoracentesis, CHF EXAM: PORTABLE CHEST 1 VIEW COMPARISON:  Portable exam 3846 hours compared to 09/15/2017 FINDINGS: Small bibasilar pleural effusions and atelectasis. Persistent pulmonary edema increased on LEFT since prior exam. No pneumothorax following RIGHT thoracentesis. Persistent enlargement of cardiac silhouette with pulmonary vascular congestion. Atherosclerotic calcification aorta. Bones demineralized with BILATERAL chronic rotator cuff tears. IMPRESSION: No pneumothorax following RIGHT thoracentesis. Persistent pulmonary edema increased since previous exam with residual bibasilar effusions and atelectasis. Electronically Signed   By: Lavonia Dana M.D.   On: 09/16/2017 13:24   Dg Chest Port 1 View  Result Date: 09/15/2017 CLINICAL DATA:  Small bowel obstruction EXAM: PORTABLE CHEST 1 VIEW COMPARISON:  Portable exam 1324 hours compared to 09/13/2017 FINDINGS: Enlargement of cardiac silhouette with pulmonary vascular congestion. Diffuse pulmonary edema consistent with CHF. Associated bibasilar pleural effusions and atelectasis slightly greater on LEFT, appear progressive since prior study. No pneumothorax. Bones demineralized IMPRESSION: CHF with diffuse pulmonary edema, bibasilar effusions and atelectasis. BILATERAL pleural effusions and basilar atelectasis appear mildly progressive since previous exam Electronically Signed   By: Lavonia Dana M.D.   On: 09/15/2017 14:54   Dg Chest Portable 1 View  Result Date: 09/13/2017 CLINICAL DATA:  Shortness of breath.  Vomiting and diarrhea. EXAM: PORTABLE CHEST 1 VIEW COMPARISON:  09/01/2017 FINDINGS: Small to moderate bilateral pleural effusions, increased. Diffuse interstitial and airspace opacity, increased. Probable cardiomegaly, although partially obscured. No pneumothorax. IMPRESSION: CHF pattern including small to moderate pleural effusions. Electronically Signed    By: Monte Fantasia M.D.   On: 09/13/2017 17:53   Dg Chest Port 1 View  Result Date: 08/30/2017 CLINICAL DATA:  COPD.  Pleural effusion. EXAM: PORTABLE CHEST 1 VIEW COMPARISON:  08/29/2017. FINDINGS: Cardiomegaly with pulmonary venous congestion and bilateral interstitial prominence with bilateral pleural effusions again noted. Findings consistent with CHF. No significant change from prior study of 08/29/2017. No evidence of pneumothorax status post recent thoracentesis. IMPRESSION: 1. Persistent cardiomegaly with pulmonary venous congestion, bilateral interstitial prominence and bilateral pleural effusions again noted without significant change. Findings consistent CHF. 2. No evidence of progression of pleural effusions. No pneumothorax status post recent left thoracentesis. Electronically Signed   By: Marcello Moores  Register   On: 08/30/2017 08:43   Dg Chest Port 1 View  Result Date: 08/28/2017 CLINICAL DATA:  Hypoxemia, bilateral pleural effusions EXAM: PORTABLE CHEST 1 VIEW COMPARISON:  08/27/2017 FINDINGS: Moderate to large bilateral pleural effusions. Diffuse bilateral airspace disease again noted. No real change since prior study. Cardiomegaly. IMPRESSION: Severe diffuse bilateral airspace disease and a large effusions. No real change. Electronically Signed   By: Rolm Baptise M.D.   On: 08/28/2017 10:27   Dg Chest Port 1 View  Result Date: 08/27/2017 CLINICAL DATA:  Pleural effusion. EXAM: PORTABLE CHEST 1 VIEW COMPARISON:  08/26/2017 FINDINGS: Moderate bilateral pleural effusions. Diffuse bilateral airspace disease, likely edema/ CHF. Cardiomegaly. Slight interval worsening edema since prior study. IMPRESSION: Worsening aeration of the lungs compatible with worsening edema/ CHF. Moderate bilateral effusions are stable. Electronically Signed   By: Rolm Baptise M.D.   On: 08/27/2017 13:19   Dg Chest Port 1v Same Day  Result Date: 09/15/2017 CLINICAL DATA:  CHF, BILATERAL pleural effusions post LEFT  thoracentesis EXAM: PORTABLE CHEST 1 VIEW COMPARISON:  Portable exam 1615 hours compared to earlier study of at 1324 hours FINDINGS: Enlargement of cardiac silhouette with pulmonary vascular congestion. Slightly decreased pulmonary edema in LEFT lung. Decreased LEFT pleural effusion  and basilar atelectasis post thoracentesis. No pneumothorax. Persistent RIGHT pulmonary edema, RIGHT pleural effusion and basilar atelectasis. Biapical scarring. Bones demineralized. IMPRESSION: No pneumothorax following LEFT thoracentesis. Electronically Signed   By: Lavonia Dana M.D.   On: 09/15/2017 16:40   Dg Abd 2 Views  Result Date: 09/15/2017 CLINICAL DATA:  Bowel obstruction EXAM: ABDOMEN - 2 VIEW COMPARISON:  September 14, 2017 FINDINGS: Supine and upright images were obtained. There are loops of mildly dilated small bowel in the lower abdomen and pelvis, similar to 1 day prior. There are foci of contrast in the colon. There are scattered colonic diverticula. No evident free air. There is contrast in the urinary bladder. There is lumbar scoliosis. IMPRESSION: Contrast is seen in the colon. There are scattered colonic diverticula. Mild dilatation of several small bowel loops, similar to 1 day prior. No air-fluid levels appreciable. No free air. Electronically Signed   By: Lowella Grip III M.D.   On: 09/15/2017 22:59   Dg Abd 2 Views  Result Date: 09/14/2017 CLINICAL DATA:  Small-bowel obstruction EXAM: ABDOMEN - 2 VIEW COMPARISON:  CT abdomen 09/13/2017 FINDINGS: Dilated small bowel loops in the pelvis similar to improved from the prior study. Contrast in the renal collecting system bilaterally from recent CT. No renal obstruction. Moderate lumbar scoliosis.  No acute skeletal abnormality. IMPRESSION: Small bowel dilatation similar to slightly improved from yesterday. Electronically Signed   By: Franchot Gallo M.D.   On: 09/14/2017 07:48   US Thoracentesis Asp Pleural Space W/img Guide  Result Date:  09/16/2017 INDICATION: CHF, BILATERAL pleural effusions, prior LEFT thoracentesis, for RIGHT thoracentesis TODAY EXAM: ULTRASOUND GUIDED DIAGNOSTIC AND THERAPEUTIC RIGHT THORACENTESIS MEDICATIONS: None. COMPLICATIONS: None immediate. PROCEDURE: Procedure, benefits, and risks of procedure were discussed with patient. Written informed consent for procedure was obtained. Time out protocol followed. Pleural effusion localized by ultrasound at the posterior RIGHT hemithorax. Skin prepped and draped in usual sterile fashion. Skin and soft tissues anesthetized with 10 mL of 1% lidocaine. 8 French thoracentesis catheter placed into the RIGHT pleural space. 1 L of amber colored RIGHT pleural fluid aspirated by vacuum bottle. Procedure tolerated well by patient without immediate complication. FINDINGS: A total of approximately 1 L of RIGHT pleural fluid was removed. Samples were sent to the laboratory as requested by the clinical team. IMPRESSION: Successful ultrasound guided RIGHT thoracentesis yielding 1 of pleural fluid. Electronically Signed   By: Lavonia Dana M.D.   On: 09/16/2017 13:28   US Thoracentesis Asp Pleural Space W/img Guide  Result Date: 09/15/2017 INDICATION: CHF, BILATERAL pleural effusions EXAM: ULTRASOUND GUIDED DIAGNOSTIC AND THERAPEUTIC LEFT THORACENTESIS MEDICATIONS: None. COMPLICATIONS: None immediate. PROCEDURE: Procedure, benefits, and risks of procedure were discussed with patient. Written informed consent for procedure was obtained. Time out protocol followed. Pleural effusion localized by ultrasound at the posterior LEFT hemithorax. Skin prepped and draped in usual sterile fashion. Skin and soft tissues anesthetized with 10 mL of 1% lidocaine. 8 French thoracentesis catheter placed into the LEFT pleural space. 1.3 L of amber colored LEFT pleural fluid was aspirated by syringe pump. Procedure tolerated well by patient without immediate complication. FINDINGS: A total of approximately 1.3 L of  LEFT pleural fluid was removed. Samples were sent to the laboratory as requested by the clinical team. IMPRESSION: Successful ultrasound guided LEFT thoracentesis yielding 1.3 of pleural fluid. Electronically Signed   By: Lavonia Dana M.D.   On: 09/15/2017 16:34   US Thoracentesis Asp Pleural Space W/img Guide  Result Date: 08/29/2017 INDICATION: Pleural  effusions resistant to diuresis. EXAM: ULTRASOUND GUIDED LEFT THORACENTESIS MEDICATIONS: None. COMPLICATIONS: None immediate. PROCEDURE: An ultrasound guided thoracentesis was thoroughly discussed with the patient and questions answered. The benefits, risks, alternatives and complications were also discussed. The patient is at increased bleeding risk due to Eliquis use and we had a lengthy discussion about this; the patient's husband was also called on the telephone. The patient understands and wishes to proceed with the procedure. Written consent was obtained. Ultrasound was performed to localize and mark an adequate pocket of fluid in the left chest. The area was then prepped and draped in the normal sterile fashion. 1% Lidocaine was used for local anesthesia. Under ultrasound guidance a 19 gauge, 7-cm, Yueh catheter was introduced. Thoracentesis was performed until the available fluid was drained. I scanned the left chest and there was no evidence of bleeding. The catheter was removed and a dressing applied. FINDINGS: A total of approximately 1 L of amber fluid was removed. Samples were sent to the laboratory as requested by the clinical team. A chest x-ray was obtained and shows significant decrease in left pleural effusion, with small volume fluid remaining. No visible pneumothorax. There is a moderate right pleural effusion. Cardiomegaly and vascular congestion. IMPRESSION: 1. Successful ultrasound guided left thoracentesis yielding 1 L of pleural fluid. 2. Minimal fluid remaining on the left. Right pleural effusion is moderate. Electronically Signed   By:  Monte Fantasia M.D.   On: 08/29/2017 11:35       Subjective:  Shortness of breath is better.  No chest pain. Discharge Exam: Vitals:   09/21/17 0959 09/21/17 1429  BP:  132/64  Pulse:  74  Resp:  18  Temp:  98.7 F (37.1 C)  SpO2: 96% 94%   Vitals:   09/20/17 2255 09/21/17 0656 09/21/17 0959 09/21/17 1429  BP: 123/66 (!) 121/59  132/64  Pulse: 79 82  74  Resp: _0 Temp: 97.6 F (36.4 C) (!) 97 F (36.1 C)  98.7 F (37.1 C)  TempSrc: Oral Oral  Oral  SpO2: 92% 94% 96% 94%  Weight:  51.2 kg (112 lb 14 oz)    Height:        General: Pt is alert, awake, not in acute distress Cardiovascular: Regular, S1/S2 +, no rubs, no gallops Respiratory: Diminished breath sounds at bases Abdominal: Soft, NT, ND, bowel sounds + Extremities: no edema, no cyanosis    The results of significant diagnostics from this hospitalization (including imaging, microbiology, ancillary and laboratory) are listed below for reference.     Microbiology: Recent Results (from the past 240 hour(s))  Blood culture (routine x 2)     Status: None   Collection Time: 09/13/17  4:38 PM  Result Value Ref Range Status   Specimen Description BLOOD RIGHT FOREARM DRAWN BY RN  Final   Special Requests   Final    BOTTLES DRAWN AEROBIC AND ANAEROBIC Blood Culture adequate volume   Culture NO GROWTH 5 DAYS  Final   Report Status 09/18/2017 FINAL  Final  Blood culture (routine x 2)     Status: None   Collection Time: 09/13/17  4:50 PM  Result Value Ref Range Status   Specimen Description LEFT ANTECUBITAL  Final   Special Requests   Final    BOTTLES DRAWN AEROBIC AND ANAEROBIC Blood Culture adequate volume   Culture NO GROWTH 5 DAYS  Final   Report Status 09/18/2017 FINAL  Final  MRSA PCR Screening  Status: None   Collection Time: 09/14/17  2:20 PM  Result Value Ref Range Status   MRSA by PCR NEGATIVE NEGATIVE Final    Comment:        The GeneXpert MRSA Assay (FDA approved for NASAL  specimens only), is one component of a comprehensive MRSA colonization surveillance program. It is not intended to diagnose MRSA infection nor to guide or monitor treatment for MRSA infections.   Gram stain     Status: None   Collection Time: 09/15/17  3:30 PM  Result Value Ref Range Status   Specimen Description PLEURAL  Final   Special Requests NONE  Final   Gram Stain   Final    Performed at [place] PMN AND MONONUCLEAR NO ORGANISMS SEEN    Report Status 09/15/2017 FINAL  Final  Culture, body fluid-bottle     Status: None   Collection Time: 09/15/17  4:00 PM  Result Value Ref Range Status   Specimen Description ASCITIC  Final   Special Requests BOTTLES DRAWN AEROBIC AND ANAEROBIC 10CC  Final   Culture NO GROWTH 5 DAYS  Final   Report Status 09/20/2017 FINAL  Final     Labs: BNP (last 3 results) Recent Labs    04/05/17 0359 04/25/17 0430 08/26/17 1210  BNP 433.0* 329.0* 537.9*   Basic Metabolic Panel: Recent Labs  Lab 09/15/17 0407  09/17/17 0438 09/18/17 0610 09/19/17 0540 09/19/17 2338 09/21/17 0358  NA 140   < > 140 140 143 140 141  K 4.1   < > 4.2 3.4* 3.7 3.5 3.4*  CL 99*   < > 99* 97* 97* 94* 92*  CO2 30   < > 30 31 36* 33* 37*  GLUCOSE 111*   < > 107* 115* 112* 147* 120*  BUN 41*   < > 37* 28* 21* 18 17  CREATININE 1.80*   < > 1.20* 1.02* 0.91 0.91 0.90  CALCIUM 8.1*   < > 8.0* 8.2* 8.4* 8.3* 8.3*  MG 1.8  --   --   --   --   --   --    < > = values in this interval not displayed.   Liver Function Tests: Recent Labs  Lab 09/16/17 0359  AST 18  ALT 14  ALKPHOS 59  BILITOT 0.4  PROT 6.4*  ALBUMIN 2.5*   No results for input(s): LIPASE, AMYLASE in the last 168 hours. No results for input(s): AMMONIA in the last 168 hours. CBC: Recent Labs  Lab 09/15/17 0407 09/16/17 0359 09/17/17 0438 09/18/17 0610  WBC 13.9* 11.9* 10.7* 9.4  HGB 10.6* 10.3* 9.3* 10.1*  HCT 37.4 36.0 31.3* 33.9*  MCV  99.5 97.3 94.8 94.7  PLT 359 274 280 306   Cardiac Enzymes: No results for input(s): CKTOTAL, CKMB, CKMBINDEX, TROPONINI in the last 168 hours. BNP: Invalid input(s): POCBNP CBG: No results for input(s): GLUCAP in the last 168 hours. D-Dimer No results for input(s): DDIMER in the last 72 hours. Hgb A1c No results for input(s): HGBA1C in the last 72 hours. Lipid Profile No results for input(s): CHOL, HDL, LDLCALC, TRIG, CHOLHDL, LDLDIRECT in the last 72 hours. Thyroid function studies No results for input(s): TSH, T4TOTAL, T3FREE, THYROIDAB in the last 72 hours.  Invalid input(s): FREET3 Anemia work up No results for input(s): VITAMINB12, FOLATE, FERRITIN, TIBC, IRON, RETICCTPCT in the last 72 hours. Urinalysis    Component Value Date/Time   COLORURINE YELLOW 09/13/2017 1647  APPEARANCEUR CLEAR 09/13/2017 1647   LABSPEC 1.010 09/13/2017 1647   PHURINE 6.0 09/13/2017 1647   GLUCOSEU NEGATIVE 09/13/2017 1647   HGBUR NEGATIVE 09/13/2017 [place] 09/13/2017 1647   KETONESUR NEGATIVE 09/13/2017 1647   PROTEINUR 30 (A) 09/13/2017 1647   UROBILINOGEN 0.2 08/20/2014 1613   NITRITE NEGATIVE 09/13/2017 1647   LEUKOCYTESUR TRACE (A) 09/13/2017 1647   Sepsis Labs Invalid input(s): PROCALCITONIN,  WBC,  LACTICIDVEN Microbiology Recent Results (from the past 240 hour(s))  Blood culture (routine x 2)     Status: None   Collection Time: 09/13/17  4:38 PM  Result Value Ref Range Status   Specimen Description BLOOD RIGHT FOREARM DRAWN BY RN  Final   Special Requests   Final    BOTTLES DRAWN AEROBIC AND ANAEROBIC Blood Culture adequate volume   Culture NO GROWTH 5 DAYS  Final   Report Status 09/18/2017 FINAL  Final  Blood culture (routine x 2)     Status: None   Collection Time: 09/13/17  4:50 PM  Result Value Ref Range Status   Specimen Description LEFT ANTECUBITAL  Final   Special Requests   Final    BOTTLES DRAWN AEROBIC AND ANAEROBIC Blood Culture adequate  volume   Culture NO GROWTH 5 DAYS  Final   Report Status 09/18/2017 FINAL  Final  MRSA PCR Screening     Status: None   Collection Time: 09/14/17  2:20 PM  Result Value Ref Range Status   MRSA by PCR NEGATIVE NEGATIVE Final    Comment:        The GeneXpert MRSA Assay (FDA approved for NASAL specimens only), is one component of a comprehensive MRSA colonization surveillance program. It is not intended to diagnose MRSA infection nor to guide or monitor treatment for MRSA infections.   Gram stain     Status: None   Collection Time: 09/15/17  3:30 PM  Result Value Ref Range Status   Specimen Description PLEURAL  Final   Special Requests NONE  Final   Gram Stain   Final    Performed at [place] PMN AND MONONUCLEAR NO ORGANISMS SEEN    Report Status 09/15/2017 FINAL  Final  Culture, body fluid-bottle     Status: None   Collection Time: 09/15/17  4:00 PM  Result Value Ref Range Status   Specimen Description ASCITIC  Final   Special Requests BOTTLES DRAWN AEROBIC AND ANAEROBIC 10CC  Final   Culture NO GROWTH 5 DAYS  Final   Report Status 09/20/2017 FINAL  Final     Time coordinating discharge: Over 30 minutes  SIGNED:   Kathie Dike, MD  [facility] 09/21/2017, 7:58 PM Pager   If 7PM-7AM, please contact night-coverage [email or web address] Password [email or web address]

## 2017-09-21 NOTE — Discharge Instructions (Signed)
Heart Failure Eating Plan Heart failure, also called congestive heart failure, occurs when your heart does not pump blood well enough to meet your body's needs for oxygen-rich blood. Heart failure is a long-term (chronic) condition. Living with heart failure can be challenging. However, following your health care provider's instructions about a healthy lifestyle and working with a diet and nutrition specialist (dietitian) to choose the right foods may help to improve your symptoms. What are tips for following this plan? General guidelines  Do not eat more than 2,300 mg of salt (sodium) a day. The amount of sodium that is recommended for you may be lower, depending on your condition.  Maintain a healthy body weight as directed. Ask your health care provider what a healthy weight is for you. ? Check your weight every day. ? Work with your health care provider and dietitian to make a plan that is right for you to lose weight or maintain your current weight.  Limit how much fluid you drink. Ask your health care provider or dietitian how much fluid you can have each day.  Limit or avoid alcohol as told by your health care provider or dietitian. Reading food labels  Check food labels for the amount of sodium per serving. Choose foods that have less than 140 mg (milligrams) of sodium in each serving.  Check food labels for the number of calories per serving. This is important if you need to limit your daily calorie intake to lose weight.  Check food labels for the serving size. If you eat more than one serving, you will be eating more sodium and calories than what is listed on the label.  Look for foods that are labeled as "sodium-free," "very low sodium," or "low sodium." ? Foods labeled as "reduced sodium" or "lightly salted" may still have more sodium than what is recommended for you. Cooking  Avoid adding salt when cooking. Ask your health care provider or dietitian before using salt  substitutes.  Season food with salt-free seasonings, spices, or herbs. Check the label of seasoning mixes to make sure they do not contain salt.  Cook with heart-healthy oils, such as olive, canola, soybean, or sunflower oil.  Do not fry foods. Cook foods using low-fat methods, such as baking, boiling, grilling, and broiling.  Limit unhealthy fats when cooking by: ? Removing the skin from poultry, such as chicken. ? Removing all visible fats from meats. ? Skimming the fat off from stews, soups, and gravies before serving them. Meal planning  Limit your intake of: ? Processed, canned, or pre-packaged foods. ? Foods that are high in trans fat, such as fried foods. ? Sweets, desserts, sugary drinks, and other foods with added sugar. ? Full-fat dairy products, such as whole milk.  Eat a balanced diet that includes: ? 4-5 servings of fruit each day and 4-5 servings of vegetables each day. At each meal, try to fill half of your plate with fruits and vegetables. ? Up to 6-8 servings of whole grains each day. ? Up to 2 servings of lean meat, poultry, or fish each day. One serving of meat is equal to 3 oz. This is about the same size as a deck of cards. ? 2 servings of low-fat dairy each day. ? Heart-healthy fats. Healthy fats called omega-3 fatty acids are found in foods such as flaxseed and cold-water fish like sardines, salmon, and mackerel.  Aim to eat 25-35 g (grams) of fiber a day. Foods that are high in fiber include apples,  broccoli, carrots, beans, peas, and whole grains.  Do not add salt or condiments that contain salt (such as soy sauce) to foods before eating.  When eating at a restaurant, ask that your food be prepared with less salt or no salt, if possible.  Try to eat 2 or more vegetarian meals each week.  Eat more home-cooked food and eat less restaurant, buffet, and fast food. Recommended foods The items listed may not be a complete list. Talk with your dietitian about  what dietary choices are best for you. Grains Bread with less than 80 mg of sodium per slice. Whole-wheat pasta, quinoa, and brown rice. Oats and oatmeal. Barley. [place]. Grits and cream of wheat. Whole-grain and whole-wheat cold cereal. Vegetables All fresh vegetables. Vegetables that are frozen without sauce or added salt. Low-sodium or sodium-free canned vegetables. Fruits All fresh, frozen, and canned fruits. Dried fruits, such as raisins, prunes, and cranberries. Meats and other protein foods Lean cuts of meat. Skinless chicken and [place]. Fish with high omega-3 fatty acids, such as salmon, sardines, and other cold-water fishes. Eggs. Dried beans, peas, and edamame. Unsalted nuts and nut butters. Dairy Low-fat or nonfat (skim) milk and dried milk. Rice milk, soy milk, and almond milk. Low-fat or nonfat yogurt. Small amounts of reduced-sodium block cheese. Low-sodium cottage cheese. Fats and oils Olive, canola, soybean, flaxseed, or sunflower oil. Avocado. Sweets and desserts Apple sauce. Granola bars. Sugar-free pudding and gelatin. Frozen fruit bars. Seasoning and other foods Fresh and dried herbs. Lemon or lime juice. Vinegar. Low-sodium ketchup. Salt-free marinades, salad dressings, sauces, and seasonings. Foods to avoid The items listed may not be a complete list. Talk with your dietitian about what dietary choices are best for you. Grains Bread with more than 80 mg of sodium per slice. Hot or cold cereal with more than 140 mg sodium per serving. Salted pretzels and crackers. Pre-packaged breadcrumbs. Bagels, croissants, and biscuits. Vegetables Canned vegetables. Frozen vegetables with sauce or seasonings. Creamed vegetables. [place] fries. Onion rings. Pickled vegetables and sauerkraut. Fruits Fruits that are dried with sodium-containing preservatives. Meats and other protein foods Ribs and chicken wings. Bacon, ham, pepperoni, bologna, salami, and packaged luncheon meats. Hot  dogs, bratwurst, and sausage. Canned meat. Smoked meat and fish. Salted nuts and seeds. Dairy Whole milk, half-and-half, and cream. Buttermilk. Processed cheese, cheese spreads, and cheese curds. Regular cottage cheese. Feta cheese. Shredded cheese. String cheese. Fats and oils Butter, lard, shortening, ghee, and bacon fat. Canned and packaged gravies. Seasoning and other foods Onion salt, garlic salt, table salt, and sea salt. Marinades. Regular salad dressings. Relishes, pickles, and olives. Meat flavorings and tenderizers, and bouillon cubes. Horseradish, ketchup, and mustard. Worcestershire sauce. Teriyaki sauce, soy sauce (including reduced sodium). Hot sauce and Tabasco sauce. Steak sauce, fish sauce, oyster sauce, and cocktail sauce. Taco seasonings. Barbecue sauce. Tartar sauce. Summary  A heart failure eating plan includes changes that limit your intake of sodium and unhealthy fat, and it may help you lose weight or maintain a healthy weight. Your health care provider may also recommend limiting how much fluid you drink.  Most people with heart failure should eat no more than 2,300 mg of salt (sodium) a day. The amount of sodium that is recommended for you may be lower, depending on your condition.  Contact your health care provider or dietitian before making any major changes to your diet. This information is not intended to replace advice given to you by your health care provider. Make sure you  discuss any questions you have with your health care provider. Document Released: 01/28/2017 Document Revised: 01/28/2017 Document Reviewed: 01/28/2017 Elsevier Interactive Patient Education  2018 [place].   Heart Failure Heart failure is a condition in which the heart has trouble pumping blood because it has become weak or stiff. This means that the heart does not pump blood efficiently for the body to work well. For some people with heart failure, fluid may back up into the lungs and  there may be swelling (edema) in the lower legs. Heart failure is usually a long-term (chronic) condition. It is important for you to take good care of yourself and follow the treatment plan from your health care provider. What are the causes? This condition is caused by some health problems, including:  High blood pressure (hypertension). Hypertension causes the heart muscle to work harder than normal. High blood pressure eventually causes the heart to become stiff and weak.  Coronary artery disease (CAD). CAD is the buildup of cholesterol and fat (plaques) in the arteries of the heart.  Heart attack (myocardial infarction). Injured tissue, which is caused by the heart attack, does not contract as well and the heart's ability to pump blood is weakened.  Abnormal heart valves. When the heart valves do not open and close properly, the heart muscle must pump harder to keep the blood flowing.  Heart muscle disease (cardiomyopathy or myocarditis). Heart muscle disease is damage to the heart muscle from a variety of causes, such as drug or alcohol abuse, infections, or unknown causes. These can increase the risk of heart failure.  Lung disease. When the lungs do not work properly, the heart must work harder.  What increases the risk? Risk of heart failure increases as a person ages. This condition is also more likely to develop in people who:  Are overweight.  Are female.  Smoke or chew tobacco.  Abuse alcohol or illegal drugs.  Have taken medicines that can damage the heart, such as chemotherapy drugs.  Have diabetes. ? High blood sugar (glucose) is associated with high fat (lipid) levels in the blood. ? Diabetes can also damage tiny blood vessels that carry nutrients to the heart muscle.  Have abnormal heart rhythms.  Have thyroid problems.  Have low blood counts (anemia).  What are the signs or symptoms? Symptoms of this condition include:  Shortness of breath with activity,  such as when climbing stairs.  Persistent cough.  Swelling of the feet, ankles, legs, or abdomen.  Unexplained weight gain.  Difficulty breathing when lying flat (orthopnea).  Waking from sleep because of the need to sit up and get more air.  Rapid heartbeat.  Fatigue and loss of energy.  Feeling light-headed, dizzy, or close to fainting.  Loss of appetite.  Nausea.  Increased urination during the night (nocturia).  Confusion.  How is this diagnosed? This condition is diagnosed based on:  Medical history, symptoms, and a physical exam.  Diagnostic tests, which may include: ? Echocardiogram. ? Electrocardiogram (ECG). ? Chest X-ray. ? Blood tests. ? Exercise stress test. ? Radionuclide scans. ? Cardiac catheterization and angiogram.  How is this treated? Treatment for this condition is aimed at managing the symptoms of heart failure. Medicines, behavioral changes, or other treatments may be necessary to treat heart failure. Medicines These may include:  Angiotensin-converting enzyme (ACE) inhibitors. This type of medicine blocks the effects of a blood protein called angiotensin-converting enzyme. ACE inhibitors relax (dilate) the blood vessels and help to lower blood pressure.  Angiotensin receptor blockers (ARBs). This type of medicine blocks the actions of a blood protein called angiotensin. ARBs dilate the blood vessels and help to lower blood pressure.  Water pills (diuretics). Diuretics cause the kidneys to remove salt and water from the blood. The extra fluid is removed through urination, leaving a lower volume of blood that the heart has to pump.  Beta blockers. These improve heart muscle strength and they prevent the heart from beating too quickly.  Digoxin. This increases the force of the heartbeat.  Healthy behavior changes These may include:  Reaching and maintaining a healthy weight.  Stopping smoking or chewing tobacco.  Eating heart-healthy  foods.  Limiting or avoiding alcohol.  Stopping use of street drugs (illegal drugs).  Physical activity.  Other treatments These may include:  Surgery to open blocked coronary arteries or repair damaged heart valves.  Placement of a biventricular pacemaker to improve heart muscle function (cardiac resynchronization therapy). This device paces both the right ventricle and left ventricle.  Placement of a device to treat serious abnormal heart rhythms (implantable cardioverter defibrillator, or ICD).  Placement of a device to improve the pumping ability of the heart (left ventricular assist device, or LVAD).  Heart transplant. This can cure heart failure, and it is considered for certain patients who do not improve with other therapies.  Follow these instructions at home: Medicines  Take over-the-counter and prescription medicines only as told by your health care provider. Medicines are important in reducing the workload of your heart, slowing the progression of heart failure, and improving your symptoms. ? Do not stop taking your medicine unless your health care provider told you to do that. ? Do not skip any dose of medicine. ? Refill your prescriptions before you run out of medicine. You need your medicines every day. Eating and drinking   Eat heart-healthy foods. Talk with a dietitian to make an eating plan that is right for you. ? Choose foods that contain no trans fat and are low in saturated fat and cholesterol. Healthy choices include fresh or frozen fruits and vegetables, fish, lean meats, legumes, fat-free or low-fat dairy products, and whole-grain or high-fiber foods. ? Limit salt (sodium) if directed by your health care provider. Sodium restriction may reduce symptoms of heart failure. Ask a dietitian to recommend heart-healthy seasonings. ? Use healthy cooking methods instead of frying. Healthy methods include roasting, grilling, broiling, baking, poaching, steaming, and  stir-frying.  Limit your fluid intake if directed by your health care provider. Fluid restriction may reduce symptoms of heart failure. Lifestyle  Stop smoking or using chewing tobacco. Nicotine and tobacco can damage your heart and your blood vessels. Do not use nicotine gum or patches before talking to your health care provider.  Limit alcohol intake to no more than 1 drink per day for non-pregnant women and 2 drinks per day for men. One drink equals 12 oz of beer, 5 oz of wine, or 1 oz of hard liquor. ? Drinking more than that is harmful to your heart. Tell your health care provider if you drink alcohol several times a week. ? Talk with your health care provider about whether any level of alcohol use is safe for you. ? If your heart has already been damaged by alcohol or you have severe heart failure, drinking alcohol should be stopped completely.  Stop use of illegal drugs.  Lose weight if directed by your health care provider. Weight loss may reduce symptoms of heart failure.  Do moderate  physical activity if directed by your health care provider. People who are elderly and people with severe heart failure should consult with a health care provider for physical activity recommendations. Monitor important information  Weigh yourself every day. Keeping track of your weight daily helps you to notice excess fluid sooner. ? Weigh yourself every morning after you urinate and before you eat breakfast. ? Wear the same amount of clothing each time you weigh yourself. ? Record your daily weight. Provide your health care provider with your weight record.  Monitor and record your blood pressure as told by your health care provider.  Check your pulse as told by your health care provider. Dealing with extreme temperatures  If the weather is extremely hot: ? Avoid vigorous physical activity. ? Use air conditioning or fans or seek a cooler location. ? Avoid caffeine and alcohol. ? Wear  loose-fitting, lightweight, and light-colored clothing.  If the weather is extremely cold: ? Avoid vigorous physical activity. ? Layer your clothes. ? Wear mittens or gloves, a hat, and a scarf when you go outside. ? Avoid alcohol. General instructions  Manage other health conditions such as hypertension, diabetes, thyroid disease, or abnormal heart rhythms as told by your health care provider.  Learn to manage stress. If you need help to do this, ask your health care provider.  Plan rest periods when fatigued.  Get ongoing education and support as needed.  Participate in or seek rehabilitation as needed to maintain or improve independence and quality of life.  Stay up to date with immunizations. Keeping current on pneumococcal and influenza immunizations is especially important to prevent respiratory infections.  Keep all follow-up visits as told by your health care provider. This is important. Contact a health care provider if:  You have a rapid weight gain.  You have increasing shortness of breath that is unusual for you.  You are unable to participate in your usual physical activities.  You tire easily.  You cough more than normal, especially with physical activity.  You have any swelling or more swelling in areas such as your hands, feet, ankles, or abdomen.  You are unable to sleep because it is hard to breathe.  You feel like your heart is beating quickly (palpitations).  You become dizzy or light-headed when you stand up. Get help right away if:  You have difficulty breathing.  You notice or your family notices a change in your awareness, such as having trouble staying awake or having difficulty with concentration.  You have pain or discomfort in your chest.  You have an episode of fainting (syncope). This information is not intended to replace advice given to you by your health care provider. Make sure you discuss any questions you have with your health care  provider. Document Released: 09/13/2005 Document Revised: 05/18/2016 Document Reviewed: 04/07/2016 Elsevier Interactive Patient Education  [employer].

## 2017-09-21 NOTE — Progress Notes (Signed)
IV removed, 2x2 gauze and paper tape applied to site, patient tolerated well.  Reviewed discharge instructions with patient and patient's daughter, both verbalized understanding.

## 2017-09-21 NOTE — Care Management Note (Signed)
Case Management Note  Patient Details  Name: Andrea [name] MRN: [id number] Date of Birth: 08-06-1929   Expected Discharge Date:   09/21/2017               Expected Discharge Plan:  [place]  In-House Referral:  Hospice / Palliative Care  Discharge planning Services  CM Consult  Post Acute Care Choice:  Home Health, Resumption of Svcs/PTA Provider Choice offered to:    [facility]  HH Arranged:  PT HH Agency:  [facility]  Status of Service:  Completed, signed off  Additional Comments: Pt anticipating DC home today with resumption of [place] services. She is aware HH has 48 hrs to resume services. Vaughan Basta, [facility] rep, aware of potential DC today and will pull pt info from chart. Pt has cont oxygen at home pta. She will have no new needs at DC.   Sherald Barge, RN 09/21/2017, 1:55 PM

## 2017-09-21 NOTE — Progress Notes (Signed)
Progress Note  Patient Name: Andrea [name] Date of Encounter: 09/21/2017  Primary Cardiologist: No primary care provider on file.   Subjective   Breathing OK at rest Does get SOB with walking  NO CP    Inpatient Medications    Scheduled Meds: . apixaban  2.5 mg Oral BID  . diltiazem  60 mg Oral Q6H  . furosemide  40 mg Intravenous Daily  . hydrocerin   Topical BID  . ipratropium  0.5 mg Nebulization TID  . levalbuterol  0.63 mg Nebulization TID  . levothyroxine  50 mcg Oral QAC breakfast  . mouth rinse  15 mL Mouth Rinse BID  . metoprolol tartrate  50 mg Oral BID  . nystatin  5 mL Oral Q6H  . pantoprazole  40 mg Oral Daily  . vancomycin  125 mg Oral Q6H   Continuous Infusions:  PRN Meds: acetaminophen **OR** acetaminophen, ALPRAZolam, LORazepam, ondansetron **OR** ondansetron (ZOFRAN) IV   Vital Signs    Vitals:   09/20/17 2037 09/20/17 2255 09/21/17 0656 09/21/17 0959  BP:  123/66 (!) 121/59   Pulse:  79 82   Resp:  20 15   Temp:  97.6 F (36.4 C) (!) 97 F (36.1 C)   TempSrc:  Oral Oral   SpO2: 93% 92% 94% 96%  Weight:   112 lb 14 oz (51.2 kg)   Height:        Intake/Output Summary (Last 24 hours) at 09/21/2017 1242 Last data filed at 09/21/2017 0900 Gross per 24 hour  Intake 840 ml  Output 750 ml  Net 90 ml   Filed Weights   09/17/17 1321 09/20/17 1500 09/21/17 0656  Weight: 122 lb 4.8 oz (55.5 kg) 114 lb 3.2 oz (51.8 kg) 112 lb 14 oz (51.2 kg)    Telemetry    Atrial fib   80s   - Personally Reviewed  ECG      Physical Exam   GEN: Thin 83 yo No acute distress.   Neck: No JVD Cardiac: Irreg irreg  II/VI systolic murmur  No , rubs, or gallops.  Respiratory: Decreased BS at bases  GI: Soft, nontender, non-distended  MS: No edema; No deformity. Neuro:  Nonfocal  Psych: Normal affect   Labs    Chemistry Recent Labs  Lab 09/16/17 0359  09/19/17 0540 09/19/17 2338 09/21/17 0358  NA 139   < > 143 140 141  K 4.0   < > 3.7 3.5  3.4*  CL 99*   < > 97* 94* 92*  CO2 30   < > 36* 33* 37*  GLUCOSE 147*   < > 112* 147* 120*  BUN 43*   < > 21* 18 17  CREATININE 1.60*   < > 0.91 0.91 0.90  CALCIUM 8.2*   < > 8.4* 8.3* 8.3*  PROT 6.4*  --   --   --   --   ALBUMIN 2.5*  --   --   --   --   AST 18  --   --   --   --   ALT 14  --   --   --   --   ALKPHOS 59  --   --   --   --   BILITOT 0.4  --   --   --   --   GFRNONAA 28*   < > 56* 56* 56*  GFRAA 33*   < > >60 >60 >60  ANIONGAP 10   < >  _0 < > = values in this interval not displayed.     Hematology Recent Labs  Lab 09/16/17 0359 09/17/17 0438 09/18/17 0610  WBC 11.9* 10.7* 9.4  RBC 3.70* 3.30* 3.58*  HGB 10.3* 9.3* 10.1*  HCT 36.0 31.3* 33.9*  MCV 97.3 94.8 94.7  MCH 27.8 28.2 28.2  MCHC 28.6* 29.7* 29.8*  RDW 14.4 14.6 14.6  PLT 274 280 306    Cardiac EnzymesNo results for input(s): TROPONINI in the last 168 hours. No results for input(s): TROPIPOC in the last 168 hours.   BNPNo results for input(s): BNP, PROBNP in the last 168 hours.   DDimer No results for input(s): DDIMER in the last 168 hours.   Radiology    Dg Chest 2 View  Result Date: 09/20/2017 CLINICAL DATA:  Shortness of breath EXAM: CHEST  2 VIEW COMPARISON:  Two days ago FINDINGS: Moderate presumably layering pleural effusions. Pulmonary edema seen previously is improved/resolved. Much of the lower lobes are obscured, as is the lower heart. Stable visible mediastinal contours. No air bronchograms. No pneumothorax. Chronic right rotator cuff tear with high humeral head IMPRESSION: 1. Moderate bilateral pleural effusion obscuring the lower chest. 2. Pulmonary edema seen 2 days ago is improved/resolved. Electronically Signed   By: Monte Fantasia M.D.   On: 09/20/2017 16:15    Cardiac Studies     Patient Profile     82 y.o. female  Assessment & Plan    1  Atrial fibrillation.  Pt's rates are better  Keep on same meds  2  Acute on chronic diastolic CHF  Volume is improved     I would keep on lasix 40 bid  Will have pt set up for labs next week and appt after Low Na  Fluid 1500 cc per day   OK to d/c  Would benefit from home nursing  Pt lives with husband who is ill.     For questions or updates, please contact [place] Please consult [email or web address] for contact info under Cardiology/STEMI.      Signed, Dorris Carnes, MD  09/21/2017, 12:42 PM

## 2017-09-21 NOTE — Progress Notes (Signed)
Subjective: She says she feels better.  No new complaints.  Her breathing is doing okay.  She has not noticed any chest pain.  She is not having any symptomatic rapid atrial fib  Objective: Vital signs in last 24 hours: Temp:  [97 F (36.1 C)-98.3 F (36.8 C)] 97 F (36.1 C) (12/26 0656) Pulse Rate:  [79-91] 82 (12/26 0656) Resp:  [15-20] 15 (12/26 0656) BP: (121-123)/(59-66) 121/59 (12/26 0656) SpO2:  [92 %-96 %] 96 % (12/26 0959) Weight:  [51.2 kg (112 lb 14 oz)-51.8 kg (114 lb 3.2 oz)] 51.2 kg (112 lb 14 oz) (12/26 0656) Weight change:  Last BM Date: 09/19/17  Intake/Output from previous day: 12/25 0701 - 12/26 0700 In: 960 [P.O.:960] Out: 1250 [Urine:1250]  PHYSICAL EXAM General appearance: alert, cooperative and Very thin Resp: rhonchi bilaterally Cardio: irregularly irregular rhythm GI: soft, non-tender; bowel sounds normal; no masses,  no organomegaly Extremities: extremities normal, atraumatic, no cyanosis or edema Skin warm and dry  Lab Results:  Results for orders placed or performed during the hospital encounter of 09/13/17 (from the past 48 hour(s))  Basic metabolic panel     Status: Abnormal   Collection Time: 09/19/17 11:38 PM  Result Value Ref Range   Sodium 140 135 - 145 mmol/L   Potassium 3.5 3.5 - 5.1 mmol/L   Chloride 94 (L) 101 - 111 mmol/L   CO2 33 (H) 22 - 32 mmol/L   Glucose, Bld 147 (H) 65 - 99 mg/dL   BUN 18 6 - 20 mg/dL   Creatinine, Ser 0.91 0.44 - 1.00 mg/dL   Calcium 8.3 (L) 8.9 - 10.3 mg/dL   GFR calc non Af Amer 56 (L) >60 mL/min   GFR calc Af Amer >60 >60 mL/min    Comment: (NOTE) The eGFR has been calculated using the CKD EPI equation. This calculation has not been validated in all clinical situations. eGFR's persistently <60 mL/min signify possible Chronic Kidney Disease.    Anion gap 13 5 - 15  Basic metabolic panel     Status: Abnormal   Collection Time: 09/21/17  3:58 AM  Result Value Ref Range   Sodium 141 135 - 145 mmol/L    Potassium 3.4 (L) 3.5 - 5.1 mmol/L   Chloride 92 (L) 101 - 111 mmol/L   CO2 37 (H) 22 - 32 mmol/L   Glucose, Bld 120 (H) 65 - 99 mg/dL   BUN 17 6 - 20 mg/dL   Creatinine, Ser 0.90 0.44 - 1.00 mg/dL   Calcium 8.3 (L) 8.9 - 10.3 mg/dL   GFR calc non Af Amer 56 (L) >60 mL/min   GFR calc Af Amer >60 >60 mL/min    Comment: (NOTE) The eGFR has been calculated using the CKD EPI equation. This calculation has not been validated in all clinical situations. eGFR's persistently <60 mL/min signify possible Chronic Kidney Disease.    Anion gap 12 5 - 15    ABGS No results for input(s): PHART, PO2ART, TCO2, HCO3 in the last 72 hours.  Invalid input(s): PCO2 CULTURES Recent Results (from the past 240 hour(s))  Blood culture (routine x 2)     Status: None   Collection Time: 09/13/17  4:38 PM  Result Value Ref Range Status   Specimen Description BLOOD RIGHT FOREARM DRAWN BY RN  Final   Special Requests   Final    BOTTLES DRAWN AEROBIC AND ANAEROBIC Blood Culture adequate volume   Culture NO GROWTH 5 DAYS  Final   Report  Status 09/18/2017 FINAL  Final  Blood culture (routine x 2)     Status: None   Collection Time: 09/13/17  4:50 PM  Result Value Ref Range Status   Specimen Description LEFT ANTECUBITAL  Final   Special Requests   Final    BOTTLES DRAWN AEROBIC AND ANAEROBIC Blood Culture adequate volume   Culture NO GROWTH 5 DAYS  Final   Report Status 09/18/2017 FINAL  Final  MRSA PCR Screening     Status: None   Collection Time: 09/14/17  2:20 PM  Result Value Ref Range Status   MRSA by PCR NEGATIVE NEGATIVE Final    Comment:        The GeneXpert MRSA Assay (FDA approved for NASAL specimens only), is one component of a comprehensive MRSA colonization surveillance program. It is not intended to diagnose MRSA infection nor to guide or monitor treatment for MRSA infections.   Gram stain     Status: None   Collection Time: 09/15/17  3:30 PM  Result Value Ref Range Status    Specimen Description PLEURAL  Final   Special Requests NONE  Final   Gram Stain   Final    Performed at [facility] PMN AND MONONUCLEAR NO ORGANISMS SEEN    Report Status 09/15/2017 FINAL  Final  Culture, body fluid-bottle     Status: None   Collection Time: 09/15/17  4:00 PM  Result Value Ref Range Status   Specimen Description ASCITIC  Final   Special Requests BOTTLES DRAWN AEROBIC AND ANAEROBIC 10CC  Final   Culture NO GROWTH 5 DAYS  Final   Report Status 09/20/2017 FINAL  Final   Studies/Results: Dg Chest 2 View  Result Date: 09/20/2017 CLINICAL DATA:  Shortness of breath EXAM: CHEST  2 VIEW COMPARISON:  Two days ago FINDINGS: Moderate presumably layering pleural effusions. Pulmonary edema seen previously is improved/resolved. Much of the lower lobes are obscured, as is the lower heart. Stable visible mediastinal contours. No air bronchograms. No pneumothorax. Chronic right rotator cuff tear with high humeral head IMPRESSION: 1. Moderate bilateral pleural effusion obscuring the lower chest. 2. Pulmonary edema seen 2 days ago is improved/resolved. Electronically Signed   By: Monte Fantasia M.D.   On: 09/20/2017 16:15    Medications:  Prior to Admission:  Medications Prior to Admission  Medication Sig Dispense Refill Last Dose  . ALPRAZolam (XANAX) 0.25 MG tablet Take 0.125-0.25 mg by mouth at bedtime.    09/12/2017 at Unknown time  . apixaban (ELIQUIS) 2.5 MG TABS tablet Take 1 tablet (2.5 mg total) by mouth 2 (two) times daily. 180 tablet 3 09/13/2017 at 1100a  . diltiazem (CARDIZEM) 60 MG tablet Take 60 mg by mouth 4 (four) times daily.    09/13/2017 at Unknown time  . furosemide (LASIX) 20 MG tablet Take 1 tablet (20 mg total) by mouth daily. 30 tablet 0 09/13/2017 at Unknown time  . levothyroxine (SYNTHROID, LEVOTHROID) 50 MCG tablet TAKE ONE TABLET BY MOUTH ONCE A DAY AS DIRECTED 90 tablet 1 09/13/2017 at Unknown time  . Metoprolol  Tartrate 37.5 MG TABS Take 37.5 mg by mouth 2 (two) times daily. 180 tablet 3 09/13/2017 at 1100a  . Probiotic Product (PROBIOTIC-10) CHEW Chew 2 tablets by mouth daily.   Past Week at Unknown time  . metroNIDAZOLE (FLAGYL) 500 MG tablet Take 1 tablet 3 x / day after meals for infection (Patient not taking: Reported on 09/13/2017) 42 tablet 0 Not Taking at  Unknown time  . silver sulfADIAZINE (SILVADENE) 1 % cream Apply to affected area daily (Patient not taking: Reported on 09/13/2017) 50 g 1 Completed Course at Unknown time  . traMADol (ULTRAM) 50 MG tablet Take 1 tablet (50 mg total) by mouth every 6 (six) hours as needed. 20 tablet 0 UNKNOWN   Scheduled: . apixaban  2.5 mg Oral BID  . diltiazem  60 mg Oral Q6H  . furosemide  40 mg Intravenous Daily  . hydrocerin   Topical BID  . ipratropium  0.5 mg Nebulization TID  . levalbuterol  0.63 mg Nebulization TID  . levothyroxine  50 mcg Oral QAC breakfast  . mouth rinse  15 mL Mouth Rinse BID  . metoprolol tartrate  50 mg Oral BID  . nystatin  5 mL Oral Q6H  . pantoprazole  40 mg Oral Daily  . vancomycin  125 mg Oral Q6H   Continuous:  ZOX:[id number] **OR** acetaminophen, ALPRAZolam, LORazepam, ondansetron **OR** ondansetron (ZOFRAN) IV  Assesment: She was admitted with acute on chronic hypoxic respiratory failure and that is getting better.  She is now on 2 L nasal oxygen.  She has acute on chronic diastolic heart failure which seems to be improving.  She had bilateral pleural effusions and has had thoracentesis on both sides but still has remaining pleural effusion.  She had what appeared to be small bowel obstruction or partial small bowel obstruction on admission which has resolved  She has C. difficile colitis and she is being treated with oral vancomycin now and that seems to be better.  She has protein calorie malnutrition which probably also contributes to her pleural effusions and she is on feeding supplements. Active  Problems:   Bilateral pleural effusion   Acute on chronic respiratory failure with hypoxia (HCC)   AF (paroxysmal atrial fibrillation) (HCC)   Protein-calorie malnutrition, severe (HCC)   Acute on chronic diastolic CHF (congestive heart failure) (HCC)   SBO (small bowel obstruction) (HCC)   C. difficile colitis   Bronchiectasis ([place])   Congestive heart failure ([place])   Palliative care encounter   Goals of care, counseling/discussion   DNR (do not resuscitate) discussion   Recurrent left pleural effusion   S/P thoracentesis    Plan: Continue current treatments.  Per Dr. Roderic Palau she is approaching discharge and I will plan to follow a bit more peripherally    LOS: 8 days   Ladora Osterberg L 09/21/2017, 10:13 AM

## 2017-09-22 ENCOUNTER — Ambulatory Visit: Payer: Medicare Other | Admitting: Cardiology

## 2017-09-22 ENCOUNTER — Encounter: Payer: Self-pay | Admitting: *Deleted

## 2017-09-23 DIAGNOSIS — I351 Nonrheumatic aortic (valve) insufficiency: Secondary | ICD-10-CM | POA: Diagnosis not present

## 2017-09-23 DIAGNOSIS — I480 Paroxysmal atrial fibrillation: Secondary | ICD-10-CM | POA: Diagnosis not present

## 2017-09-23 DIAGNOSIS — Z9981 Dependence on supplemental oxygen: Secondary | ICD-10-CM | POA: Diagnosis not present

## 2017-09-23 DIAGNOSIS — I110 Hypertensive heart disease with heart failure: Secondary | ICD-10-CM | POA: Diagnosis not present

## 2017-09-23 DIAGNOSIS — I5033 Acute on chronic diastolic (congestive) heart failure: Secondary | ICD-10-CM | POA: Diagnosis not present

## 2017-09-23 DIAGNOSIS — J439 Emphysema, unspecified: Secondary | ICD-10-CM | POA: Diagnosis not present

## 2017-09-23 DIAGNOSIS — E039 Hypothyroidism, unspecified: Secondary | ICD-10-CM | POA: Diagnosis not present

## 2017-09-23 DIAGNOSIS — L03115 Cellulitis of right lower limb: Secondary | ICD-10-CM | POA: Diagnosis not present

## 2017-09-23 DIAGNOSIS — Z79891 Long term (current) use of opiate analgesic: Secondary | ICD-10-CM | POA: Diagnosis not present

## 2017-09-23 DIAGNOSIS — Z7951 Long term (current) use of inhaled steroids: Secondary | ICD-10-CM | POA: Diagnosis not present

## 2017-09-23 DIAGNOSIS — E785 Hyperlipidemia, unspecified: Secondary | ICD-10-CM | POA: Diagnosis not present

## 2017-09-27 LAB — FUNGAL ORGANISM REFLEX

## 2017-09-27 LAB — FUNGUS CULTURE RESULT

## 2017-09-27 LAB — FUNGUS CULTURE WITH STAIN

## 2017-09-28 ENCOUNTER — Telehealth: Payer: Self-pay | Admitting: *Deleted

## 2017-09-28 NOTE — Telephone Encounter (Signed)
CALLED PT SCHEDULED HER HOSP FU FOR 1/7 DUE TO OTHER APPOINTMENTS PATIENT COULD NOT COME IN TILL THIS TIME

## 2017-09-29 ENCOUNTER — Encounter: Payer: Self-pay | Admitting: Physician Assistant

## 2017-09-30 ENCOUNTER — Encounter: Payer: Self-pay | Admitting: Physician Assistant

## 2017-09-30 ENCOUNTER — Ambulatory Visit: Payer: Medicare Other | Admitting: Physician Assistant

## 2017-09-30 VITALS — BP 118/70 | HR 72 | Ht 63.0 in | Wt 104.2 lb

## 2017-09-30 DIAGNOSIS — R600 Localized edema: Secondary | ICD-10-CM | POA: Diagnosis not present

## 2017-09-30 DIAGNOSIS — I2723 Pulmonary hypertension due to lung diseases and hypoxia: Secondary | ICD-10-CM

## 2017-09-30 DIAGNOSIS — J449 Chronic obstructive pulmonary disease, unspecified: Secondary | ICD-10-CM

## 2017-09-30 DIAGNOSIS — I10 Essential (primary) hypertension: Secondary | ICD-10-CM | POA: Diagnosis not present

## 2017-09-30 DIAGNOSIS — I5032 Chronic diastolic (congestive) heart failure: Secondary | ICD-10-CM | POA: Diagnosis not present

## 2017-09-30 DIAGNOSIS — I480 Paroxysmal atrial fibrillation: Secondary | ICD-10-CM

## 2017-09-30 DIAGNOSIS — S81801S Unspecified open wound, right lower leg, sequela: Secondary | ICD-10-CM

## 2017-09-30 NOTE — Progress Notes (Addendum)
Cardiology Office Note    Date:  10/02/2017   ID:  Andrea [name], DOB 10/10/1928, MRN [id number]  PCP:  Unk Pinto, MD  Cardiologist:  Dr. Stanford Breed   Chief Complaint  Patient presents with  . Hospitalization Follow-up    seen for Dr. Stanford Breed    History of Present Illness:  Andrea [name] is a 84 y.o. female with PMH of HTN, palpitation, chronic diastolic HF, AI and PAF on eliquis. Myoview October 2009 showed EF 73% and normal perfusion. CardioNet in February 2014 showed sinus rhythm, PACs and PVCs with occasional junctional escape beats and pauses greater than 3 seconds. Cardizem was discontinued and Norvasc added. Heart monitor in January 2017 shows sinus rhythm with PACs and brief PAT. Echocardiogram in July 2018 shows normal LV function, mild AI, mild MR, moderate tricuspid regurgitation, mild-to-moderately elevated pulmonary pressure. Venous Dopplers in August 2018 showed no DVT. Patient had small bowel obstruction in 03/2017 and developed SVT/atrial fibrillation and started on cardizem again. He was converted to sinus rhythm with metoprolol and Cardizem. She was placed on eliquis. Since her last visit in August, she has contacted the cardiology service on 05/31/2014 complaining of feeling bad on long-acting diltiazem, he has since been transitioned to short-acting diltiazem.    She temporarily held her Eliquis due to laceration of her right heel while getting out of the car in September 2018.  Unfortunately, she was admitted in October as she has progressed to cellulitis extending from the laceration wound.  She ended up having sharp debridement of the right leg wound by surgery for necrotic infected wound.  She was seen in the ED for diarrhea in November, also treated for C. difficile as outpatient.  She was taken off of Lasix due to worsening renal function.  She returned in early December complaining of worsening lower extremity edema and found to have large bilateral pleural effusion  with sial volume overload.  She was started on IV Lasix.  Repeat echocardiogram in November 2018 showed EF 60-65%, grade 2 DD, mild AI, mild MR, moderate TR, PA peak pressure 61 mmHg, small pericardial effusion.  Her most recent admission was in late December 2018 for shortness of breath, nausea/vomiting, and abdominal distention.  EKG on arrival revealed atrial fibrillation with RVR.  CT of abdomen also revealed high-grade partial small bowel obstruction involving the mid to distal ileum, likely due to adhesions.  Ascites was also noted.  She was also found to have bilateral moderate pleural effusion.  She was treated with IV antibiotics and is slowly progressed to liquid diet.  She eventually underwent left thoracentesis with removal of 1.3 L of fluid.  She was discharged on Lasix 40 mg twice daily.  Patient presents today for cardiology office visit.  Her weight is stable at home at 104 pounds.  However she continued to have significant swelling in the lower extremity, more so on the left side.  Her right lower extremity wound is very well healed.  Her lung is passing air quite well, did not sense any recurrent pleural effusion based on physical exam.  She is scheduled to see her PCP on the 17th.  At which time repeat chest x-ray can be obtained.  I will obtain a basic metabolic panel and a CBC today.  Otherwise she is first still fairly weak.  Either at her PCPs office or treatment next office visit, I recommend to walk the patient to see if her O2 saturation drop without oxygen.  If she  will need chronic O2, I plan to order her portable oxygen concentrator.  Otherwise her heart rate is very well controlled on current medication.  For her lower extremity edema, I did recommend her to increase her Lasix to 80 mg BID for the next 3 days before going back to 40 mg twice daily.  Family will a weight diary and bring to the next office visit.    Past Medical History:  Diagnosis Date  . Aortic regurgitation     . Bronchiectasis ([place])   . COPD (chronic obstructive pulmonary disease) ([place])   . DJD (degenerative joint disease)   . HLD (hyperlipidemia)   . HTN (hypertension)   . Hypothyroidism   . Palpitations    a. has known history of PAC's and PVC's. b. 09/2015: monitor showed sinus rhythm with occasional PAC's and a brief episode of PAT.    Past Surgical History:  Procedure Laterality Date  . ABDOMINAL HYSTERECTOMY  1993  . CATARACT EXTRACTION, BILATERAL    . WOUND DEBRIDEMENT Right 06/29/2017   Procedure: RIGHT LOWER  LEG DEBRIDEMENT;  Surgeon: [name], MD;  Location: [facility];  Service: General;  Laterality: Right;    Current Medications: Outpatient Medications Prior to Visit  Medication Sig Dispense Refill  . ALPRAZolam (XANAX) 0.25 MG tablet Take 0.125-0.25 mg by mouth at bedtime.     [name] apixaban (ELIQUIS) 2.5 MG TABS tablet Take 1 tablet (2.5 mg total) by mouth 2 (two) times daily. 180 tablet 3  . diltiazem (CARDIZEM) 60 MG tablet Take 60 mg by mouth 4 (four) times daily.     . furosemide (LASIX) 40 MG tablet Take 1 tablet (40 mg total) by mouth 2 (two) times daily. 60 tablet 0  . levothyroxine (SYNTHROID, LEVOTHROID) 50 MCG tablet TAKE ONE TABLET BY MOUTH ONCE A DAY AS DIRECTED 90 tablet 1  . metoprolol tartrate (LOPRESSOR) 50 MG tablet Take 1 tablet (50 mg total) by mouth 2 (two) times daily. 60 tablet 0  . omeprazole (PRILOSEC) 40 MG capsule Take 40 mg by mouth daily.    . Probiotic Product (PROBIOTIC-10) CHEW Chew 2 tablets by mouth daily.    . vancomycin (VANCOCIN) 50 mg/mL oral solution Take 157m po QIDx7 days then 128mBIDx7 days then 12541mailyx7 days then 125m51mery other day x 7days then 125mg71mays x7 days 140 mL 0  . nystatin (MYCOSTATIN) 100000 UNIT/ML suspension Take 5 mLs (500,000 Units total) by mouth every 6 (six) hours. 60 mL 0  . silver sulfADIAZINE (SILVADENE) 1 % cream Apply to affected area daily (Patient not taking: Reported on 09/13/2017) 50 g 1  .  traMADol (ULTRAM) 50 MG tablet Take 1 tablet (50 mg total) by mouth every 6 (six) hours as needed. 20 tablet 0   No facility-administered medications prior to visit.      Allergies:   Vancomycin and Sulfa antibiotics   Social History   Socioeconomic History  . Marital status: Married    Spouse name: None  . Number of children: 5  . Years of education: None  . Highest education level: None  Social Needs  . Financial resource strain: None  . Food insecurity - worry: None  . Food insecurity - inability: None  . Transportation needs - medical: None  . Transportation needs - non-medical: None  Occupational History  . Occupation: retired  Tobacco Use  . Smoking status: Former Smoker    Packs/day: 1.00    Years: 20.00    Pack years:  20.00    Types: Cigarettes    Last attempt to quit: 09/27/1974    Years since quitting: 43.0  . Smokeless tobacco: Never Used  Substance and Sexual Activity  . Alcohol use: No  . Drug use: No  . Sexual activity: No  Other Topics Concern  . None  Social History Narrative  . None     Family History:  The patient's family history includes Asthma in her brother; Cancer in her father; Diabetes in her brother, mother, and sister; Emphysema in her mother, sister, and sister; Heart attack in her sister; Rheumatic fever in her sister.   ROS:   Please see the history of present illness.    ROS All other systems reviewed and are negative.   PHYSICAL EXAM:   VS:  BP 118/70   Pulse 72   Ht _0  (1.6 m)   Wt 104 lb 3.2 oz (47.3 kg)   BMI 18.46 kg/m    GEN: Well nourished, well developed, in no acute distress  HEENT: normal  Neck: no JVD, carotid bruits, or masses Cardiac: RRR; no murmurs, rubs, or gallops. RLE wound healing, bilateral LE edema  Respiratory:  clear to auscultation bilaterally, normal work of breathing GI: soft, nontender, nondistended, + BS MS: no deformity or atrophy  Skin: warm and dry, no rash Neuro:  Alert and Oriented x 3,  Strength and sensation are intact Psych: euthymic mood, full affect  Wt Readings from Last 3 Encounters:  09/30/17 104 lb 3.2 oz (47.3 kg)  09/21/17 112 lb 14 oz (51.2 kg)  09/08/17 108 lb 3.2 oz (49.1 kg)      Studies/Labs Reviewed:   EKG:  EKG is ordered today.  The ekg ordered today demonstrates atrial fibrillation with LVH  Recent Labs: 08/26/2017: B Natriuretic Peptide 335.0 09/15/2017: Magnesium 1.8; TSH 4.511 09/16/2017: ALT 14 09/30/2017: BUN 28; Creatinine, Ser 1.11; Hemoglobin 10.9; Platelets 271; Potassium 3.5; Sodium 143   Lipid Panel    Component Value Date/Time   CHOL 211 (H) 08/16/2017 1532   TRIG 97 08/16/2017 1532   HDL 70 08/16/2017 1532   CHOLHDL 3.0 08/16/2017 1532   VLDL 14 01/24/2017 1552   LDLCALC 104 (H) 01/24/2017 1552    Additional studies/ records that were reviewed today include:   Echo 08/26/2017 ------------------------------------------------------------------- LV EF: 60% -   65%  ------------------------------------------------------------------- Indications:      Pleural effusion 511.9.  ------------------------------------------------------------------- History:   PMH:  Skin Cancer, Former Smoker.  Atrial fibrillation. Congestive heart failure.  Chronic obstructive pulmonary disease. Risk factors:  Hypertension. Dyslipidemia.  ------------------------------------------------------------------- Study Conclusions  - Left ventricle: The cavity size was normal. Wall thickness was   normal. Systolic function was normal. The estimated ejection   fraction was in the range of 60% to 65%. Wall motion was normal;   there were no regional wall motion abnormalities. Features are   consistent with a pseudonormal left ventricular filling pattern,   with concomitant abnormal relaxation and increased filling   pressure (grade 2 diastolic dysfunction). - Aortic valve: Mildly calcified annulus. Trileaflet. There was   mild regurgitation. -  Mitral valve: There was mild regurgitation. - Left atrium: The atrium was at the upper limits of normal in   size. - Right atrium: Central venous pressure (est): 3 mm Hg. - Tricuspid valve: There was moderate regurgitation. - Pulmonary arteries: PA peak pressure: 61 mm Hg (S). - Pericardium, extracardiac: A small pericardial effusion was   identified. There was a right pleural effusion.  There was a left   pleural effusion.  Impressions:  - Normal LV wall thickness with LVEF 60-65% and grade 2 diastolic   dysfunction. Upper normal atrial chamber size. Mild mitral   regurgitation. Mildly calcified aortic valve with mild aortic   regurgitation. Moderate tricuspid regurgitation with evidence of   moderate to severe pulmonary hypertension and PASP 61 mmHg. Small   pericardial effusion noted. Bilateral pleural effusions noted.   ASSESSMENT:    1. Chronic diastolic congestive heart failure ([place])   2. Pulmonary hypertension due to COPD ([place])   3. Essential hypertension   4. Lower extremity edema   5. Wound of right lower extremity, sequela   6. PAF (paroxysmal atrial fibrillation) (HCC)      PLAN:  In order of problems listed above:  1. Chronic diastolic heart failure: Continue to have significant bilateral lower extremity edema, more so left leg.  I recommended increasing the Lasix to 80 mg a.m. and 40 mg p.m. for 3 days before going back to 40 mg twice daily.  Family will keep a weight diary.  Weight today is 104 pounds.  2. Right lower extremity wound: She had antibiotic treatment, unfortunately suffered a C. difficile infection afterward.  She underwent multiple debridement and surgical repair recently.  It appears to be healed with scar during today's visit.  I do not see any drainage or significant redness around it.  3. Persistent atrial fibrillation: On Eliquis, rate controlled on diltiazem and metoprolol.  She previously had a intolerance to the long-acting diltiazem.  We  will maintain the patient on the short acting diltiazem  4. Hypertension: Blood pressure stable.    Medication Adjustments/Labs and Tests Ordered: Current medicines are reviewed at length with the patient today.  Concerns regarding medicines are outlined above.  Medication changes, Labs and Tests ordered today are listed in the Patient Instructions below. Patient Instructions  Medication Instructions:  INCREASE- Furosemide(Lasix) 80 mg in the morning and 40 mg at night for 3 days then back to 40 mg twice a day.  If you need a refill on your cardiac medications before your next appointment, please call your pharmacy.  Labwork: CBC and BMP Today HERE IN OUR OFFICE AT [facility]  Take the provided lab slips for you to take with you to the lab for you blood draw.   You will NOT need to fast   You may go to any [facility] lab that is convenient for you however, we do have a lab in our office that is able to assist you. You do NOT need an appointment for our lab. Once in our office lobby there is a podium to the right of the check-in desk where you are to sign-in and ring a doorbell to alert [place] you are here. Lab is open Monday-Friday from 8:00am to 4:00pm; and is closed for lunch from 12:45p-1:45pm   Testing/Procedures: None Ordered  Special Instructions:  Happy New Year!!  Follow-Up: Your physician wants you to follow-up in: 2-3 Weeks with Almyra Deforest and 2-3 Months with Dr Stanford Breed.    Thank you for choosing [facility] at [employer], [place]  10/02/2017 8:05 AM    [address], [place], [place]  [address] Phone: [phone]; Fax: [phone]

## 2017-09-30 NOTE — Patient Instructions (Signed)
Medication Instructions:  INCREASE- Furosemide(Lasix) 80 mg in the morning and 40 mg at night for 3 days then back to 40 mg twice a day.  If you need a refill on your cardiac medications before your next appointment, please call your pharmacy.  Labwork: CBC and BMP Today HERE IN OUR OFFICE AT [facility]  Take the provided lab slips for you to take with you to the lab for you blood draw.   You will NOT need to fast   You may go to any [facility] lab that is convenient for you however, we do have a lab in our office that is able to assist you. You do NOT need an appointment for our lab. Once in our office lobby there is a podium to the right of the check-in desk where you are to sign-in and ring a doorbell to alert [place] you are here. Lab is open Monday-Friday from 8:00am to 4:00pm; and is closed for lunch from 12:45p-1:45pm   Testing/Procedures: None Ordered  Special Instructions:  Happy New Year!!  Follow-Up: Your physician wants you to follow-up in: 2-3 Weeks with Almyra Deforest and 2-3 Months with Dr Stanford Breed.    Thank you for choosing [facility] at [facility]!!

## 2017-10-01 LAB — BASIC METABOLIC PANEL
BUN / CREAT RATIO: 25 (ref 12–28)
BUN: 28 mg/dL — ABNORMAL HIGH (ref 8–27)
CO2: 41 mmol/L — AB (ref 20–29)
CREATININE: 1.11 mg/dL — AB (ref 0.57–1.00)
Calcium: 9.2 mg/dL (ref 8.7–10.3)
Chloride: 86 mmol/L — ABNORMAL LOW (ref 96–106)
GFR calc non Af Amer: 45 mL/min/{1.73_m2} — ABNORMAL LOW (ref >59)
GFR, EST AFRICAN AMERICAN: 52 mL/min/{1.73_m2} — AB (ref >59)
Glucose: 104 mg/dL — ABNORMAL HIGH (ref 65–99)
POTASSIUM: 3.5 mmol/L (ref 3.5–5.2)
SODIUM: 143 mmol/L (ref 134–144)

## 2017-10-01 LAB — CBC
HEMATOCRIT: 35.7 % (ref 34.0–46.6)
HEMOGLOBIN: 10.9 g/dL — AB (ref 11.1–15.9)
MCH: 28.4 pg (ref 26.6–33.0)
MCHC: 30.5 g/dL — AB (ref 31.5–35.7)
MCV: 93 fL (ref 79–97)
Platelets: 271 10*3/uL (ref 150–379)
RBC: 3.84 x10E6/uL (ref 3.77–5.28)
RDW: 14.8 % (ref 12.3–15.4)
WBC: 7.5 10*3/uL (ref 3.4–10.8)

## 2017-10-02 ENCOUNTER — Encounter: Payer: Self-pay | Admitting: Physician Assistant

## 2017-10-02 NOTE — Progress Notes (Signed)
Hospital follow up  Assessment and Plan: Hospital visit follow up for:   Diagnoses and all orders for this visit:  Acute on chronic diastolic CHF (congestive heart failure) ([place]) Discussed appropriate method of obtaining dry daily weights at home with daughter Disease process and medications discussed. Questions answered fully. Emphasized salt restriction, less than 2035m a day. Encouraged daily monitoring of the patient's weight, call office if 3 lb weight loss or gain in a day.  Encouraged regular exercise. If any increasing shortness of breath, swelling, or chest pressure go to ER immediately.  decrease your fluid intake to less than 2 L daily please remember to always increase your potassium intake with any increase of your fluid pill.   Acute on chronic respiratory failure with hypoxia (HCC) S/p bilateral thracocentesis - significantly improved.  O2 walk test performed per cardiology recommendation today - significantly desaturates with minimal ambulation, significantly limiting patient's ADLs - gets SOB Portable O2 ordered  Recurrent left pleural effusion Obtain repeat CXR at follow up in 10 days.   SBO (small bowel obstruction) (HCC) -     CBC with Differential/Platelet  C. difficile colitis -     CBC with Differential/Platelet  Acute renal failure superimposed on stage 3 chronic kidney disease, unspecified acute renal failure type ([place] [facility]     [facility], and reconciled with the patient.  Follow up in 10 days for monitoring and repeat CXR.   Over 40 minutes of exam, counseling, chart review, and complex, high/moderate level critical decision making was performed this visit.   Future Appointments  Date Time Provider DDraper 10/03/2017  3:00 PM CLiane Comber NP [facility] None  10/13/2017  3:30 PM CLiane Comber NP [facility] None  10/26/2017 11:30 AM MAlmyra Deforest PA [facility] 11/16/2017  11:00 AM CVicie Mutters PA-C [facility] None  01/02/2018 10:40 AM CStanford Breed BDenice Bors MD [facility]    HPI 84y.o.female history of bronchiectasis/COPD, chronic respiratory failure on 2 L at night/while at home, hypertension, hyperlipidemia, diastolic CHF, hypothyroidism, and PAT/PAF presents for follow up for transition from recent hospitalization. Admit date to the hospital was 09/13/17, patient was discharged from the hospital on 09/21/17 and our clinical staff contacted the office the day after discharge to set up a follow up appointment. The discharge summary, medications, and diagnostic test results were reviewed before meeting with the patient. The patient was admitted for: acute on chronic diastolic CHF (August 26, 2017 echo EF 60-65%, grade 2 DD, moderate TR, PASP 61), acute on chronic respiratory failure with hypoxia, pleural effusion, small bowel obstruction, c. Diff colitis. She presented to the ED with c/o diarrhea, s/p confirmation of c. Diff by this office and re initiation of metronidazole. Patient is admittedly not consistently compliant with outpatient treatments. CT of the abdomen and pelvis revealed moderate bilateral pleural effusions, left greater than right. There was also a dilated fluid-filled small bowel loops up to 5.3 cm with a transition point in the mid to distal ileum concerning for bowel obstruction - poor surgical candidate, was monitored and treated by IV then oral vancomycin.Chest x-ray revealed increased interstitial prominence with bilateral pleural effusions (CHF in the presence of bronchiectasis). She was treated by IV lasix and thoracocentesis were performed (12/20, L, 1.3L transudate, 12/21, R, 1L transudate). Acute on chronic CKD was also noted (12/20 BUN 41, Cr 1.8), though stable/improved as of discharge (12/26 BUN 17, Cr 0.9). Palliative care was consulted during  hospitalization and DNR was discussed though family was unreceptive to this. She was  discharged with slow extended taper of vancomycin with recommendation for cardiology follow up as well as follow up for BMP/CBC in 1 week and repeat CXR in 3-4 weeks. She has since followed up with Dr. Isaac Laud who increased her lasix x 3 days for LE edema, requesting we complete O2 walk test   O2 walk test- 97% O2 on RA at rest, she is unable to walk 15 ft before O2 sats drop to 87%, P99. She is unable to tolerate further ambulation.   Home weight stable at 104-105 lb; she has not been weighing in AM. Discussed appropriate   Home health is involved for weights and VS check.   Images while in the hospital: Ct Abdomen Pelvis W Contrast Result Date: 09/13/2017 CLINICAL DATA:  Abdominal distension with vomiting and diarrhea. Shortness of breath. EXAM: CT ABDOMEN AND PELVIS WITH CONTRAST TECHNIQUE: Multidetector CT imaging of the abdomen and pelvis was performed using the standard protocol following bolus administration of intravenous contrast. CONTRAST:  150m ISOVUE-300 IOPAMIDOL (ISOVUE-300) INJECTION 61% COMPARISON:  08/19/2017 FINDINGS: Lower chest: Emphysema. Persistent bibasilar airspace disease. Mild cardiomegaly with LAD coronary artery atherosclerosis. Moderate bilateral pleural effusions, larger on the left. Similar. Hepatobiliary: Perfusion anomaly involving the anterior left hepatic lobe image 33/ series 2. Normal gallbladder, without biliary ductal dilatation. Pancreas: Normal, without mass or ductal dilatation. Spleen: Normal in size, without focal abnormality. Adrenals/Urinary Tract: Normal right adrenal gland. Minimal left adrenal thickening and nodularity. Bilateral renal cortical thinning. Right renal lesions are favored to represent cysts or minimally complex cysts. Bilateral too small to characterize renal lesions. No hydronephrosis. Normal urinary bladder. Stomach/Bowel: Normal stomach, without wall thickening. Scattered colonic diverticula. The colon is relatively decompressed. Small bowel  loops are fluid-filled and dilated, including at up to 5.3 cm. A transition point is identified within the mid to distal ileum, including on images 73 through 77/series 2. No bowel wall thickening to suggest complicating ischemia. No pneumatosis or free intraperitoneal air. Vascular/Lymphatic: Aortic and branch vessel atherosclerosis. No abdominopelvic adenopathy. Reproductive: Hysterectomy. Other: Small volume cul-de-sac fluid including on 77/series 2. Pelvic floor laxity. Musculoskeletal: Osteopenia.  Convex right lumbar spine curvature. IMPRESSION: 1. High-grade partial small bowel obstruction involving the mid to distal ileum. Likely due to adhesions. Although there is adjacent ascites, no specific evidence of complicating ischemia identified. 2. Bilateral pleural effusions and bibasilar atelectasis, as before. 3. Coronary artery atherosclerosis. Aortic Atherosclerosis (ICD10-I70.0). 4. Pelvic floor laxity. Electronically Signed   By: KAbigail MiyamotoM.D.   On: 09/13/2017 20:47   Dg Chest Portable 1 View Result Date: 09/13/2017 CLINICAL DATA:  Shortness of breath.  Vomiting and diarrhea. EXAM: PORTABLE CHEST 1 VIEW COMPARISON:  09/01/2017 FINDINGS: Small to moderate bilateral pleural effusions, increased. Diffuse interstitial and airspace opacity, increased. Probable cardiomegaly, although partially obscured. No pneumothorax. IMPRESSION: CHF pattern including small to moderate pleural effusions. Electronically Signed   By: JMonte FantasiaM.D.   On: 09/13/2017 17:53   Dg Abd 2 Views Result Date: 09/14/2017 CLINICAL DATA:  Small-bowel obstruction EXAM: ABDOMEN - 2 VIEW COMPARISON:  CT abdomen 09/13/2017 FINDINGS: Dilated small bowel loops in the pelvis similar to improved from the prior study. Contrast in the renal collecting system bilaterally from recent CT. No renal obstruction. Moderate lumbar scoliosis.  No acute skeletal abnormality. IMPRESSION: Small bowel dilatation similar to slightly improved from  yesterday. Electronically Signed   By: CFranchot GalloM.D.   On: 09/14/2017  07:48    Past Medical History:  Diagnosis Date  . Aortic regurgitation   . Bronchiectasis ([place])   . COPD (chronic obstructive pulmonary disease) ([place])   . DJD (degenerative joint disease)   . HLD (hyperlipidemia)   . HTN (hypertension)   . Hypothyroidism   . Palpitations    a. has known history of PAC's and PVC's. b. 09/2015: monitor showed sinus rhythm with occasional PAC's and a brief episode of PAT.     Allergies  Allergen Reactions  . Vancomycin     Worsening kidney function  . Sulfa Antibiotics Palpitations    Unknown      Current Outpatient Medications on File Prior to Visit  Medication Sig Dispense Refill  . ALPRAZolam (XANAX) 0.25 MG tablet Take 0.125-0.25 mg by mouth at bedtime.     [name] apixaban (ELIQUIS) 2.5 MG TABS tablet Take 1 tablet (2.5 mg total) by mouth 2 (two) times daily. 180 tablet 3  . diltiazem (CARDIZEM) 60 MG tablet Take 60 mg by mouth 4 (four) times daily.     . furosemide (LASIX) 40 MG tablet Take 1 tablet (40 mg total) by mouth 2 (two) times daily. 60 tablet 0  . levothyroxine (SYNTHROID, LEVOTHROID) 50 MCG tablet TAKE ONE TABLET BY MOUTH ONCE A DAY AS DIRECTED 90 tablet 1  . metoprolol tartrate (LOPRESSOR) 50 MG tablet Take 1 tablet (50 mg total) by mouth 2 (two) times daily. 60 tablet 0  . omeprazole (PRILOSEC) 40 MG capsule Take 40 mg by mouth daily.    . Probiotic Product (PROBIOTIC-10) CHEW Chew 2 tablets by mouth daily.    . vancomycin (VANCOCIN) 50 mg/mL oral solution Take 138m po QIDx7 days then 1273mBIDx7 days then 1252mailyx7 days then 125m56mery other day x 7days then 125mg89mays x7 days 140 mL 0   No current facility-administered medications on file prior to visit.     ROS: all negative except above.   Physical Exam: There were no vitals filed for this visit. There were no vitals taken for this visit. General Appearance: Frail elder, in no acute  distress. Eyes: PERRLA, EOMs, conjunctiva no swelling or erythema Sinuses: No Frontal/maxillary tenderness ENT/Mouth: Ext aud canals clear, TMs without erythema, bulging. No erythema, swelling, or exudate on post pharynx.  Tonsils not swollen or erythematous. Hearing normal.  Neck: Supple, thyroid normal.  Respiratory: Respiratory effort normal, BS diminished throughout with scattered rales - no rhonchi, wheezing or stridor.  Cardio: Heart sounds distant/unclea, no audible murmur. 1+ peripheral pulses without edema.  Abdomen: Soft, + BS.  Non tender, no guarding, rebound, hernias, masses. Lymphatics: Non tender without lymphadenopathy.  Musculoskeletal: Weak but symmetrical stength 3/5 bilaterally, slow unsteady gait with cane. Skin: Warm, dry, fragile without visible rashes or notable lesions. Would to RLE healing well.  Psych: Awake and oriented X 3, normal affect, Insight and Judgment appropriate.     AshleIzora Ribas5:32 PM [facility]

## 2017-10-03 ENCOUNTER — Encounter: Payer: Self-pay | Admitting: Adult Health

## 2017-10-03 ENCOUNTER — Ambulatory Visit: Payer: Medicare Other | Admitting: Adult Health

## 2017-10-03 VITALS — BP 110/62 | HR 99 | Temp 97.9°F | Ht 63.0 in | Wt 105.2 lb

## 2017-10-03 DIAGNOSIS — I5033 Acute on chronic diastolic (congestive) heart failure: Secondary | ICD-10-CM | POA: Diagnosis not present

## 2017-10-03 DIAGNOSIS — N179 Acute kidney failure, unspecified: Secondary | ICD-10-CM

## 2017-10-03 DIAGNOSIS — N183 Chronic kidney disease, stage 3 (moderate): Secondary | ICD-10-CM | POA: Diagnosis not present

## 2017-10-03 DIAGNOSIS — J90 Pleural effusion, not elsewhere classified: Secondary | ICD-10-CM

## 2017-10-03 DIAGNOSIS — J9621 Acute and chronic respiratory failure with hypoxia: Secondary | ICD-10-CM | POA: Diagnosis not present

## 2017-10-03 DIAGNOSIS — J9611 Chronic respiratory failure with hypoxia: Secondary | ICD-10-CM | POA: Diagnosis not present

## 2017-10-03 DIAGNOSIS — K56609 Unspecified intestinal obstruction, unspecified as to partial versus complete obstruction: Secondary | ICD-10-CM | POA: Diagnosis not present

## 2017-10-03 DIAGNOSIS — A0472 Enterocolitis due to Clostridium difficile, not specified as recurrent: Secondary | ICD-10-CM

## 2017-10-03 LAB — BASIC METABOLIC PANEL WITH GFR
BUN/Creatinine Ratio: 28 (calc) — ABNORMAL HIGH (ref 6–22)
BUN: 32 mg/dL — ABNORMAL HIGH (ref 7–25)
CHLORIDE: 90 mmol/L — AB (ref 98–110)
CREATININE: 1.15 mg/dL — AB (ref 0.60–0.88)
Calcium: 9.2 mg/dL (ref 8.6–10.4)
GFR, Est African American: 50 mL/min/{1.73_m2} — ABNORMAL LOW (ref >=60)
GFR, Est Non African American: 43 mL/min/{1.73_m2} — ABNORMAL LOW (ref >=60)
GLUCOSE: 82 mg/dL (ref 65–99)
Potassium: 4.3 mmol/L (ref 3.5–5.3)
SODIUM: 142 mmol/L (ref 135–146)

## 2017-10-03 LAB — CBC WITH DIFFERENTIAL/PLATELET
BASOS PCT: 0.3 %
Basophils Absolute: 23 cells/uL (ref 0–200)
EOS PCT: 1 %
Eosinophils Absolute: 78 cells/uL (ref 15–500)
HCT: 32.6 % — ABNORMAL LOW (ref 35.0–45.0)
Hemoglobin: 10.4 g/dL — ABNORMAL LOW (ref 11.7–15.5)
Lymphs Abs: 1911 cells/uL (ref 850–3900)
MCH: 28.3 pg (ref 27.0–33.0)
MCHC: 31.9 g/dL — AB (ref 32.0–36.0)
MCV: 88.8 fL (ref 80.0–100.0)
MONOS PCT: 8.9 %
MPV: 12.3 fL (ref 7.5–12.5)
Neutro Abs: 5093 cells/uL (ref 1500–7800)
Neutrophils Relative %: 65.3 %
PLATELETS: 251 10*3/uL (ref 140–400)
RBC: 3.67 10*6/uL — ABNORMAL LOW (ref 3.80–5.10)
RDW: 13 % (ref 11.0–15.0)
TOTAL LYMPHOCYTE: 24.5 %
WBC mixed population: 694 cells/uL (ref 200–950)
WBC: 7.8 10*3/uL (ref 3.8–10.8)

## 2017-10-03 MED ORDER — OMEPRAZOLE 40 MG PO CPDR: 40.0000 mg | DELAYED_RELEASE_CAPSULE | Freq: Every day | ORAL | 1 refills | Status: DC

## 2017-10-03 NOTE — Progress Notes (Signed)
Renal function worsened slightly, I instructed the patient last week to increase his lasix to 63m AM and 469mPM for 3 days before going back to 4034mID. She should be back to the previous dose by today, I would expect renal function to normalized. CO2 was high, his O2 saturation was low during her followup with PCP, need to monitor for worsening breathing and O2 saturation. She is currently on O2.

## 2017-10-03 NOTE — Patient Instructions (Signed)
Do the following things EVERYDAY: 1) Weigh yourself in the morning before breakfast or at the same time every day. Write it down and keep it in a log. 2) Take your medicines as prescribed 3) Eat low salt foods-Limit salt (sodium) to 2000 mg per day. Best thing to do is avoid processed foods.   4) Stay as active as you can everyday 5) Limit all fluids for the day to less than 1.5 liters  Call your doctor if:  Anytime you have any of the following symptoms:  1) 2 pound weight gain in 24 hours or 5 pounds in 1 week  2) shortness of breath, with or without a dry hacking cough  3) swelling in the hands, LEGs, feet or stomach  4) if you have to sleep on extra pillows at night in order to breathe. 5) after laying down at night for 20-30 mins, you wake up short of breath.   These can all be signs of fluid overload.    Heart Failure Heart failure means your heart has trouble pumping blood. This makes it hard for your body to work well. Heart failure is usually a long-term (chronic) condition. You must take good care of yourself and follow your doctor's treatment plan. Follow these instructions at home:  Take your heart medicine as told by your doctor. ? Do not stop taking medicine unless your doctor tells you to. ? Do not skip any dose of medicine. ? Refill your medicines before they run out. ? Take other medicines only as told by your doctor or pharmacist.  Stay active if told by your doctor. The elderly and people with severe heart failure should talk with a doctor about physical activity.  Eat heart-healthy foods. Choose foods that are without trans fat and are low in saturated fat, cholesterol, and salt (sodium). This includes fresh or frozen fruits and vegetables, fish, lean meats, fat-free or low-fat dairy foods, whole grains, and high-fiber foods. Lentils and dried peas and beans (legumes) are also good choices.  Limit salt if told by your doctor.  Cook in a healthy way. Roast,  grill, broil, bake, poach, steam, or stir-fry foods.  Limit fluids as told by your doctor.  Weigh yourself every morning. Do this after you pee (urinate) and before you eat breakfast. Write down your weight to give to your doctor.  Take your blood pressure and write it down if your doctor tells you to.  Ask your doctor how to check your pulse. Check your pulse as told.  Lose weight if told by your doctor.  Stop smoking or chewing tobacco. Do not use gum or patches that help you quit without your doctor's approval.  Schedule and go to doctor visits as told.  Nonpregnant women should have no more than 1 drink a day. Men should have no more than 2 drinks a day. Talk to your doctor about drinking alcohol.  Stop illegal drug use.  Stay current with shots (immunizations).  Manage your health conditions as told by your doctor.  Learn to manage your stress.  Rest when you are tired.  If it is really hot outside: ? Avoid intense activities. ? Use air conditioning or fans, or get in a cooler place. ? Avoid caffeine and alcohol. ? Wear loose-fitting, lightweight, and light-colored clothing.  If it is really cold outside: ? Avoid intense activities. ? Layer your clothing. ? Wear mittens or gloves, a hat, and a scarf when going outside. ? Avoid alcohol.  Learn about  heart failure and get support as needed.  Get help to maintain or improve your quality of life and your ability to care for yourself as needed. Contact a doctor if:  You gain weight quickly.  You are more short of breath than usual.  You cannot do your normal activities.  You tire easily.  You cough more than normal, especially with activity.  You have any or more puffiness (swelling) in areas such as your hands, feet, ankles, or belly (abdomen).  You cannot sleep because it is hard to breathe.  You feel like your heart is beating fast (palpitations).  You get dizzy or light-headed when you stand up. Get  help right away if:  You have trouble breathing.  There is a change in mental status, such as becoming less alert or not being able to focus.  You have chest pain or discomfort.  You faint. This information is not intended to replace advice given to you by your health care provider. Make sure you discuss any questions you have with your health care provider. Document Released: 06/22/2008 Document Revised: 02/19/2016 Document Reviewed: 10/30/2012 Elsevier Interactive Patient Education  2017 [employer].

## 2017-10-04 ENCOUNTER — Other Ambulatory Visit: Payer: Self-pay | Admitting: Adult Health

## 2017-10-04 ENCOUNTER — Encounter: Payer: Self-pay | Admitting: Internal Medicine

## 2017-10-04 ENCOUNTER — Telehealth: Payer: Self-pay | Admitting: *Deleted

## 2017-10-04 ENCOUNTER — Other Ambulatory Visit: Payer: Self-pay | Admitting: Internal Medicine

## 2017-10-04 DIAGNOSIS — J439 Emphysema, unspecified: Secondary | ICD-10-CM | POA: Diagnosis not present

## 2017-10-04 DIAGNOSIS — I110 Hypertensive heart disease with heart failure: Secondary | ICD-10-CM | POA: Diagnosis not present

## 2017-10-04 DIAGNOSIS — Z79891 Long term (current) use of opiate analgesic: Secondary | ICD-10-CM | POA: Diagnosis not present

## 2017-10-04 DIAGNOSIS — I351 Nonrheumatic aortic (valve) insufficiency: Secondary | ICD-10-CM | POA: Diagnosis not present

## 2017-10-04 DIAGNOSIS — E039 Hypothyroidism, unspecified: Secondary | ICD-10-CM | POA: Diagnosis not present

## 2017-10-04 DIAGNOSIS — L03115 Cellulitis of right lower limb: Secondary | ICD-10-CM | POA: Diagnosis not present

## 2017-10-04 DIAGNOSIS — R7981 Abnormal blood-gas level: Secondary | ICD-10-CM

## 2017-10-04 DIAGNOSIS — Z7951 Long term (current) use of inhaled steroids: Secondary | ICD-10-CM | POA: Diagnosis not present

## 2017-10-04 DIAGNOSIS — I5033 Acute on chronic diastolic (congestive) heart failure: Secondary | ICD-10-CM | POA: Diagnosis not present

## 2017-10-04 DIAGNOSIS — Z9981 Dependence on supplemental oxygen: Secondary | ICD-10-CM | POA: Diagnosis not present

## 2017-10-04 DIAGNOSIS — E785 Hyperlipidemia, unspecified: Secondary | ICD-10-CM | POA: Diagnosis not present

## 2017-10-04 DIAGNOSIS — I480 Paroxysmal atrial fibrillation: Secondary | ICD-10-CM | POA: Diagnosis not present

## 2017-10-04 MED ORDER — ACETAZOLAMIDE 250 MG PO TABS: 250.0000 mg | ORAL_TABLET | Freq: Every day | ORAL | 1 refills | Status: DC

## 2017-10-04 NOTE — Telephone Encounter (Signed)
Patient called and reported her pulse has been fluctuating between 118 and 144.  She reports no chest pain or shortness of breath.  The patient asked if she can take 1/2 of her Alprazolam 0.25 mg tablet to calm her.  Per Liane Comber, NP, the patient can try the Alprazolam, but will need to go to the ED if her pulse does not reduce very shortly.  The patient is aware.

## 2017-10-05 ENCOUNTER — Encounter: Payer: Self-pay | Admitting: Physician Assistant

## 2017-10-05 DIAGNOSIS — J449 Chronic obstructive pulmonary disease, unspecified: Secondary | ICD-10-CM | POA: Diagnosis not present

## 2017-10-06 ENCOUNTER — Other Ambulatory Visit: Payer: Self-pay | Admitting: Adult Health

## 2017-10-06 DIAGNOSIS — J9611 Chronic respiratory failure with hypoxia: Secondary | ICD-10-CM

## 2017-10-07 ENCOUNTER — Ambulatory Visit: Payer: Medicare Other | Admitting: Student

## 2017-10-12 DIAGNOSIS — E039 Hypothyroidism, unspecified: Secondary | ICD-10-CM | POA: Diagnosis not present

## 2017-10-12 DIAGNOSIS — I5033 Acute on chronic diastolic (congestive) heart failure: Secondary | ICD-10-CM | POA: Diagnosis not present

## 2017-10-12 DIAGNOSIS — I480 Paroxysmal atrial fibrillation: Secondary | ICD-10-CM | POA: Diagnosis not present

## 2017-10-12 DIAGNOSIS — L03115 Cellulitis of right lower limb: Secondary | ICD-10-CM | POA: Diagnosis not present

## 2017-10-12 DIAGNOSIS — J439 Emphysema, unspecified: Secondary | ICD-10-CM | POA: Diagnosis not present

## 2017-10-12 DIAGNOSIS — I110 Hypertensive heart disease with heart failure: Secondary | ICD-10-CM | POA: Diagnosis not present

## 2017-10-12 DIAGNOSIS — Z7951 Long term (current) use of inhaled steroids: Secondary | ICD-10-CM | POA: Diagnosis not present

## 2017-10-12 DIAGNOSIS — E785 Hyperlipidemia, unspecified: Secondary | ICD-10-CM | POA: Diagnosis not present

## 2017-10-12 DIAGNOSIS — Z79891 Long term (current) use of opiate analgesic: Secondary | ICD-10-CM | POA: Diagnosis not present

## 2017-10-12 DIAGNOSIS — I351 Nonrheumatic aortic (valve) insufficiency: Secondary | ICD-10-CM | POA: Diagnosis not present

## 2017-10-12 DIAGNOSIS — Z9981 Dependence on supplemental oxygen: Secondary | ICD-10-CM | POA: Diagnosis not present

## 2017-10-12 NOTE — Progress Notes (Signed)
Assessment and Plan:  [name] was seen today for follow-up.  Diagnoses and all orders for this visit:  Chronic respiratory failure with hypoxia ([place]) Continue to monitor O2 sats at home - extended discussion about appropriate use - use only as needed to maintain sats above 92% Follow up with pulmonology -     CBC with Differential/Platelet -     BASIC METABOLIC PANEL WITH GFR -     DG Chest 2 View; Future  Goals of care, counseling/discussion Receptive to introductory discussion about hospice/palliative care. Will resume at next visit with Estill Bamberg- report given.   Chronic diastolic congestive heart failure (HCC) Take 80 mg lasix after getting home from OV; initiate 80 mg daily starting tomorrow Disease process and medications discussed. Questions answered fully. Emphasized salt restriction, less than 2066m a day. Encouraged daily monitoring of the patient's weight, call office if 2 lb weight loss or gain in a day.  Encouraged regular exercise. If any increasing shortness of breath, swelling, or chest pressure go to ER immediately.  decrease your fluid intake to less than 2 L daily please remember to always increase your potassium intake with any increase of your fluid pill.  Keep f/u OV with Dr. HIsaac Laud Further disposition pending results of labs. Discussed med's effects and SE's.   Over 30 minutes of exam, counseling, chart review, and critical decision making was performed.   Future Appointments  Date Time Provider DMercedes 10/26/2017 11:30 AM MAlmyra Deforest PUtahC[facility] 11/16/2017 11:00 AM CVicie Mutters PA-C [facility] None  01/02/2018 10:40 AM CStanford Breed BDenice Bors MD [facility]    ------------------------------------------------------------------------------------------------------------------   HPI BP (!) 118/58   Pulse 94   Temp 97.9 F (36.6 C)   Resp 20   Ht _0  (1.6 m)   Wt 107 lb (48.5 kg)   SpO2 92%   BMI 18.959kg/m    84year old  female presents for 10 day follow up from hospital discharge visit on 10/03/2016. The patient was recently admitted for: acute on chronic diastolic CHF (August 26, 2017 echo EF 60-65%, grade 2 DD, moderate TR, PASP 61), acute on chronic respiratory failure with hypoxia, pleural effusion, small bowel obstruction, c. Diff colitis. Chest x-ray revealed increased interstitial prominence with bilateral pleural effusions (CHF in the presence of bronchiectasis). She was treated by IV lasix and thoracocentesis were performed (12/20, L, 1.3L transudate, 12/21, R, 1L transudate). Acute on chronic CKD was also noted (12/20 BUN 41, Cr 1.8), though stable/improved as of discharge (12/26 BUN 17, Cr 0.9) and at repeat check for hospital follow up visit on 10/03/2017.   Palliative care was consulted during hospitalization and DNR was discussed though family was unreceptive to this. We discussed that event today and is presented in a much different manner; after explanation of palliative and hospice care details, daughter is very receptive and would like to discuss further at the next visit after portable O2 has been arranged.   Per cardiology recommendations we will be evaluating the patient for portable O2; she is currently prescribed and has been wearing 2 L O2 at night as well as constantly while sitting at home- recent CO2 on BMP 10/03/2017 was 45 - patient was counseled on the dangers of excessive oxygen use and carbon dioxide narcosis - she was advised to obtain pulse oximeter and monitor at home, and only to wear oxygen when saturations are below 92%. Diamox 250 mg daily was prescribed. Overnight O2 monitoring has been ordered.  She is 84% on RA at rest today; O2 walk test was performed  -  Please briefly explain why patient needs home oxygen: Patient saturations 85% on RA at rest, dyspnea and desaturation with exertion, benefits from supplemental oxygen      SATURATION QUALIFICATIONS:  Patient Saturations on  Room Air at Rest =  84    %  Patient Saturations on Room Air while Ambulating =   77   %  Patient Saturations on 2 Liters of oxygen while Ambulating =  94  %        Home weight stable at 104-105 lb; 2 - lb weight gain noted today in office. Patient has been prescribed 80 mg lasix daily since then; she has reportedly been taking only 40 mg daily due to fear of kidney failure/dialysis.   She has a history of Diastolic CHF, denies orthopnea and paroxysmal nocturnal dyspnea. Positive for dyspnea, fatigue and lower extremity edema. Wt Readings from Last 3 Encounters:  10/13/17 107 lb (48.5 kg)  10/03/17 105 lb 3.2 oz (47.7 kg)  09/30/17 104 lb 3.2 oz (47.3 kg)   Home health is involved for weights and VS check.    Past Medical History:  Diagnosis Date  . Aortic regurgitation   . Bronchiectasis ([place])   . COPD (chronic obstructive pulmonary disease) ([place])   . DJD (degenerative joint disease)   . HLD (hyperlipidemia)   . HTN (hypertension)   . Hypothyroidism   . Palpitations    a. has known history of PAC's and PVC's. b. 09/2015: monitor showed sinus rhythm with occasional PAC's and a brief episode of PAT.     Allergies  Allergen Reactions  . Vancomycin     Worsening kidney function  . Sulfa Antibiotics Palpitations    Unknown    Current Outpatient Medications on File Prior to Visit  Medication Sig  . acetaZOLAMIDE (DIAMOX) 250 MG tablet Take 1 tablet (250 mg total) by mouth daily.  [name] ALPRAZolam (XANAX) 0.25 MG tablet Take 0.125-0.25 mg by mouth at bedtime.   [name] apixaban (ELIQUIS) 2.5 MG TABS tablet Take 1 tablet (2.5 mg total) by mouth 2 (two) times daily.  [name] diltiazem (CARDIZEM) 60 MG tablet Take 60 mg by mouth 4 (four) times daily.   . furosemide (LASIX) 40 MG tablet Take 1 tablet (40 mg total) by mouth 2 (two) times daily.  [name] levothyroxine (SYNTHROID, LEVOTHROID) 50 MCG tablet TAKE ONE TABLET BY MOUTH ONCE A DAY AS DIRECTED  . metoprolol tartrate (LOPRESSOR) 50 MG tablet  Take 1 tablet (50 mg total) by mouth 2 (two) times daily.  [name] omeprazole (PRILOSEC) 40 MG capsule Take 1 capsule (40 mg total) by mouth daily.  . Probiotic Product (PROBIOTIC-10) CHEW Chew 2 tablets by mouth daily.  . vancomycin (VANCOCIN) 50 mg/mL oral solution Take 121m po QIDx7 days then 1270mBIDx7 days then 12524mailyx7 days then 125m3mery other day x 7days then 125mg47mays x7 days   No current facility-administered medications on file prior to visit.     ROS: all negative except above.   Physical Exam:  BP (!) 118/58   Pulse 94   Temp 97.9 F (36.6 C)   Resp 20   Ht _0  (1.6 m)   Wt 107 lb (48.5 kg)   SpO2 92%   BMI 18.95 kg/m   General Appearance: Extremely thing, appears fatigued though not in acute distress. Eyes: PERRLA, EOMs, conjunctiva no swelling or erythema Sinuses: No Frontal/maxillary tenderness ENT/Mouth:  Ext aud canals clear, TMs without erythema, bulging. No erythema, swelling, or exudate on post pharynx.  Tonsils not swollen or erythematous. Hearing normal.  Neck: Supple, thyroid normal.  Respiratory: Respiratory effort mildly increased, BS equal bilaterally - somewhat diminished throughout without rales, rhonchi, wheezing or stridor.  Cardio: Heart sounds distant - faint S1/S2 - no audible mumurs. LE pulses cannot be found due to 4+ pitting edema localized at ankles.  Abdomen: Soft, + BS.  Non tender, no guarding, rebound, hernias, masses. Lymphatics: Non tender without lymphadenopathy.  Musculoskeletal:  symmetrical strength, walks with 4 point cane Skin: Warm, dry without rashes, lesions, ecchymosis.  Neuro: Cranial nerves intact. Normal muscle tone, no cerebellar symptoms. Sensation intact.  Psych: Awake and oriented X 3, normal affect, Insight and Judgment appropriate.     Izora Ribas, NP 6:48 PM [facility]

## 2017-10-13 ENCOUNTER — Ambulatory Visit (INDEPENDENT_AMBULATORY_CARE_PROVIDER_SITE_OTHER): Payer: Medicare Other | Admitting: Adult Health

## 2017-10-13 ENCOUNTER — Encounter: Payer: Self-pay | Admitting: Adult Health

## 2017-10-13 VITALS — BP 118/58 | HR 94 | Temp 97.9°F | Resp 20 | Ht 63.0 in | Wt 107.0 lb

## 2017-10-13 DIAGNOSIS — Z7189 Other specified counseling: Secondary | ICD-10-CM | POA: Diagnosis not present

## 2017-10-13 DIAGNOSIS — I5032 Chronic diastolic (congestive) heart failure: Secondary | ICD-10-CM

## 2017-10-13 DIAGNOSIS — R0602 Shortness of breath: Secondary | ICD-10-CM

## 2017-10-13 DIAGNOSIS — R918 Other nonspecific abnormal finding of lung field: Secondary | ICD-10-CM | POA: Diagnosis not present

## 2017-10-13 DIAGNOSIS — J9611 Chronic respiratory failure with hypoxia: Secondary | ICD-10-CM

## 2017-10-13 NOTE — Patient Instructions (Addendum)
TAKE YOUR LASIX - your heart and lungs being fluid overloaded will kill you before your kidneys will. PLEASE call if you are every concerned about anything or have questions.     Take 80 mg lasix tonight when you get home  Start back on 80 mg daily lasix    Do the following things EVERYDAY: 1) Weigh yourself in the morning before breakfast or at the same time every day. Write it down and keep it in a log. 2) Take your medicines as prescribed 3) Eat low salt foods-Limit salt (sodium) to 2000 mg per day. Best thing to do is avoid processed foods.   4) Stay as active as you can everyday 5) Limit all fluids for the day to less than 1.5 liters  Call your doctor if:  Anytime you have any of the following symptoms:  1) 1 pound weight gain in 24 hours or 2-3 pounds in 1 week  2) shortness of breath, with or without a dry hacking cough  3) swelling in the hands, LEGs, feet or stomach  4) if you have to sleep on extra pillows at night in order to breathe. 5) after laying down at night for 20-30 mins, you wake up short of breath.   These can all be signs of fluid overload.    Heart Failure Heart failure means your heart has trouble pumping blood. This makes it hard for your body to work well. Heart failure is usually a long-term (chronic) condition. You must take good care of yourself and follow your doctor's treatment plan. Follow these instructions at home:  Take your heart medicine as told by your doctor. ? Do not stop taking medicine unless your doctor tells you to. ? Do not skip any dose of medicine. ? Refill your medicines before they run out. ? Take other medicines only as told by your doctor or pharmacist.  Stay active if told by your doctor. The elderly and people with severe heart failure should talk with a doctor about physical activity.  Eat heart-healthy foods. Choose foods that are without trans fat and are low in saturated fat, cholesterol, and salt (sodium). This includes  fresh or frozen fruits and vegetables, fish, lean meats, fat-free or low-fat dairy foods, whole grains, and high-fiber foods. Lentils and dried peas and beans (legumes) are also good choices.  Limit salt if told by your doctor.  Cook in a healthy way. Roast, grill, broil, bake, poach, steam, or stir-fry foods.  Limit fluids as told by your doctor.  Weigh yourself every morning. Do this after you pee (urinate) and before you eat breakfast. Write down your weight to give to your doctor.  Take your blood pressure and write it down if your doctor tells you to.  Ask your doctor how to check your pulse. Check your pulse as told.  Lose weight if told by your doctor.  Stop smoking or chewing tobacco. Do not use gum or patches that help you quit without your doctor's approval.  Schedule and go to doctor visits as told.  Nonpregnant women should have no more than 1 drink a day. Men should have no more than 2 drinks a day. Talk to your doctor about drinking alcohol.  Stop illegal drug use.  Stay current with shots (immunizations).  Manage your health conditions as told by your doctor.  Learn to manage your stress.  Rest when you are tired.  If it is really hot outside: ? Avoid intense activities. ? Use air conditioning  or fans, or get in a cooler place. ? Avoid caffeine and alcohol. ? Wear loose-fitting, lightweight, and light-colored clothing.  If it is really cold outside: ? Avoid intense activities. ? Layer your clothing. ? Wear mittens or gloves, a hat, and a scarf when going outside. ? Avoid alcohol.  Learn about heart failure and get support as needed.  Get help to maintain or improve your quality of life and your ability to care for yourself as needed. Contact a doctor if:  You gain weight quickly.  You are more short of breath than usual.  You cannot do your normal activities.  You tire easily.  You cough more than normal, especially with activity.  You have  any or more puffiness (swelling) in areas such as your hands, feet, ankles, or belly (abdomen).  You cannot sleep because it is hard to breathe.  You feel like your heart is beating fast (palpitations).  You get dizzy or light-headed when you stand up. Get help right away if:  You have trouble breathing.  There is a change in mental status, such as becoming less alert or not being able to focus.  You have chest pain or discomfort.  You faint. This information is not intended to replace advice given to you by your health care provider. Make sure you discuss any questions you have with your health care provider. Document Released: 06/22/2008 Document Revised: 02/19/2016 Document Reviewed: 10/30/2012 Elsevier Interactive Patient Education  2017 [employer].

## 2017-10-14 ENCOUNTER — Encounter: Payer: Self-pay | Admitting: Internal Medicine

## 2017-10-14 ENCOUNTER — Ambulatory Visit (HOSPITAL_COMMUNITY)
Admission: RE | Admit: 2017-10-14 | Discharge: 2017-10-14 | Disposition: A | Discharge status: Home / Self Care | Payer: Medicare Other | Source: Ambulatory Visit | Attending: Adult Health | Admitting: Adult Health

## 2017-10-14 ENCOUNTER — Telehealth: Payer: Self-pay | Admitting: Internal Medicine

## 2017-10-14 ENCOUNTER — Encounter: Payer: Self-pay | Admitting: Adult Health

## 2017-10-14 DIAGNOSIS — J9611 Chronic respiratory failure with hypoxia: Secondary | ICD-10-CM | POA: Diagnosis not present

## 2017-10-14 DIAGNOSIS — I700 Atherosclerosis of aorta: Secondary | ICD-10-CM | POA: Diagnosis not present

## 2017-10-14 DIAGNOSIS — J90 Pleural effusion, not elsewhere classified: Secondary | ICD-10-CM | POA: Insufficient documentation

## 2017-10-14 DIAGNOSIS — J9811 Atelectasis: Secondary | ICD-10-CM | POA: Insufficient documentation

## 2017-10-14 DIAGNOSIS — S81801A Unspecified open wound, right lower leg, initial encounter: Secondary | ICD-10-CM | POA: Diagnosis not present

## 2017-10-14 DIAGNOSIS — J9690 Respiratory failure, unspecified, unspecified whether with hypoxia or hypercapnia: Secondary | ICD-10-CM | POA: Diagnosis not present

## 2017-10-14 LAB — BASIC METABOLIC PANEL WITH GFR
BUN / CREAT RATIO: 26 (calc) — AB (ref 6–22)
BUN: 37 mg/dL — AB (ref 7–25)
CHLORIDE: 99 mmol/L (ref 98–110)
CO2: 33 mmol/L — AB (ref 20–32)
Calcium: 8.9 mg/dL (ref 8.6–10.4)
Creat: 1.43 mg/dL — ABNORMAL HIGH (ref 0.60–0.88)
GFR, Est African American: 38 mL/min/{1.73_m2} — ABNORMAL LOW (ref >=60)
GFR, Est Non African American: 33 mL/min/{1.73_m2} — ABNORMAL LOW (ref >=60)
GLUCOSE: 100 mg/dL — AB (ref 65–99)
Potassium: 4 mmol/L (ref 3.5–5.3)
Sodium: 141 mmol/L (ref 135–146)

## 2017-10-14 LAB — CBC WITH DIFFERENTIAL/PLATELET
BASOS ABS: 23 {cells}/uL (ref 0–200)
Basophils Relative: 0.3 %
Eosinophils Absolute: 131 cells/uL (ref 15–500)
Eosinophils Relative: 1.7 %
HEMATOCRIT: 35.6 % (ref 35.0–45.0)
HEMOGLOBIN: 11.2 g/dL — AB (ref 11.7–15.5)
LYMPHS ABS: 1640 {cells}/uL (ref 850–3900)
MCH: 28.5 pg (ref 27.0–33.0)
MCHC: 31.5 g/dL — AB (ref 32.0–36.0)
MCV: 90.6 fL (ref 80.0–100.0)
MPV: 11.5 fL (ref 7.5–12.5)
Monocytes Relative: 7.6 %
NEUTROS ABS: 5321 {cells}/uL (ref 1500–7800)
NEUTROS PCT: 69.1 %
Platelets: 277 10*3/uL (ref 140–400)
RBC: 3.93 10*6/uL (ref 3.80–5.10)
RDW: 13.1 % (ref 11.0–15.0)
Total Lymphocyte: 21.3 %
WBC: 7.7 10*3/uL (ref 3.8–10.8)
WBCMIX: 585 {cells}/uL (ref 200–950)

## 2017-10-14 LAB — BRAIN NATRIURETIC PEPTIDE: BRAIN NATRIURETIC PEPTIDE: 813 pg/mL — AB (ref <100)

## 2017-10-14 NOTE — Progress Notes (Signed)
O2 Saturation test performed. The following readings were as follows: 1 liter at rest- 93%, at exertion: 74%. 2 liters at rest: 93%, at exertion: 94%  Patient's pulse oximetry at room air: 84%, and pulse oximetry on exertion: 77%

## 2017-10-14 NOTE — Telephone Encounter (Signed)
Spoke with pt's daughter, [name]. Pt has been scheduled to see TP on 10/19/17 at 10:45am. Nothing further was needed.

## 2017-10-17 ENCOUNTER — Other Ambulatory Visit: Payer: Self-pay | Admitting: Adult Health

## 2017-10-17 ENCOUNTER — Telehealth: Payer: Self-pay | Admitting: Internal Medicine

## 2017-10-17 ENCOUNTER — Other Ambulatory Visit: Payer: Self-pay | Admitting: Internal Medicine

## 2017-10-17 DIAGNOSIS — J9611 Chronic respiratory failure with hypoxia: Secondary | ICD-10-CM

## 2017-10-17 NOTE — Telephone Encounter (Signed)
Andrea [name], Katrina A  This weekend [facility].   We delivered both a stationary and  portable Oxygen  tanks. The pt is wanting a POC. I have placed a evaluation for a POC for our RT Support Team to schedule. Thank you! Portable Oxygen Concentrator.

## 2017-10-19 ENCOUNTER — Encounter: Payer: Self-pay | Admitting: Adult Health

## 2017-10-19 ENCOUNTER — Ambulatory Visit: Payer: Medicare Other | Admitting: Adult Health

## 2017-10-19 VITALS — BP 108/72 | HR 80 | Ht 63.0 in | Wt 107.0 lb

## 2017-10-19 DIAGNOSIS — J449 Chronic obstructive pulmonary disease, unspecified: Secondary | ICD-10-CM | POA: Diagnosis not present

## 2017-10-19 DIAGNOSIS — R531 Weakness: Secondary | ICD-10-CM | POA: Diagnosis not present

## 2017-10-19 DIAGNOSIS — J90 Pleural effusion, not elsewhere classified: Secondary | ICD-10-CM | POA: Diagnosis not present

## 2017-10-19 DIAGNOSIS — S81801A Unspecified open wound, right lower leg, initial encounter: Secondary | ICD-10-CM | POA: Diagnosis not present

## 2017-10-19 DIAGNOSIS — J9611 Chronic respiratory failure with hypoxia: Secondary | ICD-10-CM

## 2017-10-19 DIAGNOSIS — J438 Other emphysema: Secondary | ICD-10-CM

## 2017-10-19 DIAGNOSIS — I2723 Pulmonary hypertension due to lung diseases and hypoxia: Secondary | ICD-10-CM

## 2017-10-19 DIAGNOSIS — I5032 Chronic diastolic (congestive) heart failure: Secondary | ICD-10-CM | POA: Diagnosis not present

## 2017-10-19 DIAGNOSIS — R05 Cough: Secondary | ICD-10-CM | POA: Diagnosis not present

## 2017-10-19 MED ORDER — FLUTTER DEVI: 0 refills | Status: AC

## 2017-10-19 NOTE — Progress Notes (Signed)
_0  ID: Andrea [name], female    DOB: 22-Oct-1928, 84 y.o.   MRN: [id number]  No chief complaint on file.   Referring provider: Unk Pinto, MD  HPI: 84 yo female former smoker with bronchiectasis /COPD , Chronic Resp failure on O2 and D CHF .   10/19/2017 Follow up : [facility] follow up  Patient presents for a follow-up visit.  Patient was recently hospitalized at the end of December for acute on chronic diastolic heart failure, bilateral pleural effusions and small bowel obstruction with recurrent C. difficile colitis.  Patient had been admitted in November for decompensated congestive heart failure and C. difficile colitis.  She returned back to the hospital December 18 with diarrhea and vomiting.  She was found to have recurrent C. difficile.  Small bowel obstruction.  And decompensated congestive heart failure with bilateral pleural effusions.  She was seen by pulmonary and underwent a left-sided thoracentesis with 1.3 L removed.  And a right thoracentesis with 1 L removed.  Patient was diuresed..  Fluid analysis revealed this was transudate effusion.  2D echo November 30 showed an EF of 45-03%, grade 2 diastolic dysfunction and moderate tricuspid regurgitation with pulmonary hypertension with pulmonary artery pressures at 61 mmHg. Since discharge she is doing some better. No further vomiting or diarrhea.   She remains on Oxygen 1-2 liters. She is interested in a portable device as can not carry her O2 tank as too heavy. Wants to try a innogen device.  O2 sats 86% walking on room air. O2 sats >90% on 2l/m O2.  She denies chest pain, orthopnea, increased edema.  CXR today with bilateral pleural effusion -stable since discharge.     Allergies  Allergen Reactions  . Vancomycin     Worsening kidney function  . Sulfa Antibiotics Palpitations    Unknown    Immunization History  Administered Date(s) Administered  . Influenza Split 07/16/2013  . Influenza, High Dose  Seasonal PF 06/25/2015, 06/10/2016, 06/30/2017  . Influenza,inj,Quad PF,6+ Mos 07/04/2014  . Pneumococcal Conjugate-13 02/26/2014  . Pneumococcal Polysaccharide-23 10/04/2016  . Td 06/26/2013  . Tdap 06/19/2017    Past Medical History:  Diagnosis Date  . Aortic regurgitation   . Bronchiectasis ([place])   . COPD (chronic obstructive pulmonary disease) ([place])   . DJD (degenerative joint disease)   . HLD (hyperlipidemia)   . HTN (hypertension)   . Hypothyroidism   . Palpitations    a. has known history of PAC's and PVC's. b. 09/2015: monitor showed sinus rhythm with occasional PAC's and a brief episode of PAT.    Tobacco History: Social History   Tobacco Use  Smoking Status Former Smoker  . Packs/day: 1.00  . Years: 20.00  . Pack years: 20.00  . Types: Cigarettes  . Last attempt to quit: 09/27/1974  . Years since quitting: 43.0  Smokeless Tobacco Never Used   Counseling given: Not Answered   Outpatient Encounter Medications as of 10/19/2017  Medication Sig  . acetaZOLAMIDE (DIAMOX) 250 MG tablet Take 1 tablet (250 mg total) by mouth daily.  [name] ALPRAZolam (XANAX) 0.25 MG tablet Take 0.125-0.25 mg by mouth at bedtime.   [name] apixaban (ELIQUIS) 2.5 MG TABS tablet Take 1 tablet (2.5 mg total) by mouth 2 (two) times daily.  [name] diltiazem (CARDIZEM) 60 MG tablet Take 60 mg by mouth 4 (four) times daily.   . furosemide (LASIX) 40 MG tablet TAKE 1 TABLET (40 MG TOTAL) BY MOUTH TWICE DAILY.  [name] levothyroxine (SYNTHROID,  LEVOTHROID) 50 MCG tablet TAKE ONE TABLET BY MOUTH ONCE A DAY AS DIRECTED  . metoprolol tartrate (LOPRESSOR) 50 MG tablet Take 1 tablet (50 mg total) by mouth 2 (two) times daily.  [name] omeprazole (PRILOSEC) 40 MG capsule Take 1 capsule (40 mg total) by mouth daily.  . Probiotic Product (PROBIOTIC-10) CHEW Chew 2 tablets by mouth daily.  . vancomycin (VANCOCIN) 50 mg/mL oral solution Take 167m po QIDx7 days then 1220mBIDx7 days then 12534mailyx7 days then 125m73mery other day x  7days then 125mg60mays x7 days   No facility-administered encounter medications on file as of 10/19/2017.      Review of Systems  Constitutional:   No  weight loss, night sweats,  Fevers, chills, + fatigue, or  lassitude.  HEENT:   No headaches,  Difficulty swallowing,  Tooth/dental problems, or  Sore throat,                No sneezing, itching, ear ache, nasal congestion, post nasal drip,   CV:  No chest pain,  Orthopnea, PND, swelling in lower extremities, anasarca, dizziness, palpitations, syncope.   GI  No heartburn, indigestion, abdominal pain, nausea, vomiting, diarrhea, change in bowel habits, loss of appetite, bloody stools.   Resp:  No chest wall deformity  Skin: no rash or lesions.  GU: no dysuria, change in color of urine, no urgency or frequency.  No flank pain, no hematuria   MS:  No joint pain or swelling.  No decreased range of motion.  No back pain.    Physical Exam  BP 108/72 (BP Location: Right Arm, Cuff Size: Normal)   Pulse 80   Ht _0  (1.6 m)   Wt 107 lb (48.5 kg)   SpO2 90%   BMI 18.95 kg/m   GEN: A/Ox3; pleasant , NAD, frail and elderly in wc    HEENT:  [place]/AT,  EACs-clear, TMs-wnl, NOSE-clear, THROAT-clear, no lesions, no postnasal drip or exudate noted.   NECK:  Supple w/ fair ROM; no JVD; normal carotid impulses w/o bruits; no thyromegaly or nodules palpated; no lymphadenopathy.    RESP  Decreased BS in bases , no accessory muscle use, no dullness to percussion  CARD:  RRR, no m/r/g, 1+ peripheral edema, pulses intact, no cyanosis or clubbing.  GI:   Soft & nt; nml bowel sounds; no organomegaly or masses detected.   Musco: Warm bil, no deformities or joint swelling noted.   Neuro: alert, no focal deficits noted.    Skin: Warm, no lesions or rashes    Lab Results:  CBC  BMET  BNP  ProBNP No results found for: PROBNP  Imaging: Dg Chest 2 View  Result Date: 10/14/2017 CLINICAL DATA:  Chronic respiratory failure EXAM:  CHEST  2 VIEW COMPARISON:  September 20, 2017 FINDINGS: There are chronic pleural effusions bilaterally with atelectatic change in lung bases, more on the left than on the right. Lungs elsewhere appear clear. Heart size and pulmonary vascularity are normal. No adenopathy. There is aortic atherosclerosis. No evident bone lesions. IMPRESSION: Persistent pleural effusions bilaterally with bibasilar atelectasis, more on the left than on the right. No new opacity. Heart size normal. There is aortic atherosclerosis. Aortic Atherosclerosis (ICD10-I70.0). Electronically Signed   By: WilliLowella GripM.D.   On: 10/14/2017 13:58   Dg Chest 2 View  Result Date: 09/20/2017 CLINICAL DATA:  Shortness of breath EXAM: CHEST  2 VIEW COMPARISON:  Two days ago FINDINGS: Moderate presumably layering pleural effusions. Pulmonary  edema seen previously is improved/resolved. Much of the lower lobes are obscured, as is the lower heart. Stable visible mediastinal contours. No air bronchograms. No pneumothorax. Chronic right rotator cuff tear with high humeral head IMPRESSION: 1. Moderate bilateral pleural effusion obscuring the lower chest. 2. Pulmonary edema seen 2 days ago is improved/resolved. Electronically Signed   By: Monte Fantasia M.D.   On: 09/20/2017 16:15     Assessment & Plan:   No problem-specific Assessment & Plan notes found for this encounter.     Rexene Edison, NP 10/19/2017

## 2017-10-19 NOTE — Patient Instructions (Signed)
Increase Duoneb neb Twice daily  .  Add Flutter valve Twice daily  .  Continue on Oxygen  2 l/m , goal is to keep oxygen >88-90% . Do not need to be greater than 95%.  Order for POC device .  Follow up with Dr. Lamonte Sakai in 4-6 weeks and As needed   Please contact office for sooner follow up if symptoms do not improve or worsen or seek emergency care

## 2017-10-19 NOTE — Progress Notes (Signed)
Patient seen in the office today and instructed on use of Flutter valve.  Patient expressed understanding and demonstrated technique. Parke Poisson, [facility] 10/19/17

## 2017-10-20 ENCOUNTER — Telehealth: Payer: Self-pay | Admitting: Internal Medicine

## 2017-10-20 NOTE — Telephone Encounter (Signed)
What note and order do you need signed, please? I last saw her in 2016.

## 2017-10-20 NOTE — Assessment & Plan Note (Signed)
Transudative Bilateral pleural effusion from decompensated D CHF - improved after throacentesis and diuresis  CXR w/ stable effusions since discharge  Plan  . Patient Instructions  Increase Duoneb neb Twice daily  .  Add Flutter valve Twice daily  .  Continue on Oxygen  2 l/m , goal is to keep oxygen >88-90% . Do not need to be greater than 95%.  Order for POC device .  Follow up with Dr. Lamonte Sakai in 4-6 weeks and As needed   Please contact office for sooner follow up if symptoms do not improve or worsen or seek emergency care

## 2017-10-20 NOTE — Assessment & Plan Note (Addendum)
Cont on O2 to keep sats in low 90s.  Order for Innogen from DME .   Plan  Patient Instructions  Increase Duoneb neb Twice daily  .  Add Flutter valve Twice daily  .  Continue on Oxygen  2 l/m , goal is to keep oxygen >88-90% . Do not need to be greater than 95%.  Order for POC device .  Follow up with Dr. Lamonte Sakai in 4-6 weeks and As needed   Please contact office for sooner follow up if symptoms do not improve or worsen or seek emergency care

## 2017-10-20 NOTE — Telephone Encounter (Signed)
Spoke with Andrea [name] and advised her chart note was signed and order placed for POC. She was able to pull the order and OV and nothing further is needed.

## 2017-10-20 NOTE — Telephone Encounter (Signed)
Called and spoke with Estill Bamberg from [facility] and she stated that she did not need anything faxed, but needs to have the order and the OV note signed.  Will forward to CY to make him aware.  Nothing further is needed.

## 2017-10-20 NOTE — Assessment & Plan Note (Signed)
Increase duoneb Twice daily    Plan  . Patient Instructions  Increase Duoneb neb Twice daily  .  Add Flutter valve Twice daily  .  Continue on Oxygen  2 l/m , goal is to keep oxygen >88-90% . Do not need to be greater than 95%.  Order for POC device .  Follow up with Dr. Lamonte Sakai in 4-6 weeks and As needed   Please contact office for sooner follow up if symptoms do not improve or worsen or seek emergency care

## 2017-10-21 DIAGNOSIS — I351 Nonrheumatic aortic (valve) insufficiency: Secondary | ICD-10-CM | POA: Diagnosis not present

## 2017-10-21 DIAGNOSIS — E785 Hyperlipidemia, unspecified: Secondary | ICD-10-CM | POA: Diagnosis not present

## 2017-10-21 DIAGNOSIS — Z7951 Long term (current) use of inhaled steroids: Secondary | ICD-10-CM | POA: Diagnosis not present

## 2017-10-21 DIAGNOSIS — I5033 Acute on chronic diastolic (congestive) heart failure: Secondary | ICD-10-CM | POA: Diagnosis not present

## 2017-10-21 DIAGNOSIS — I110 Hypertensive heart disease with heart failure: Secondary | ICD-10-CM | POA: Diagnosis not present

## 2017-10-21 DIAGNOSIS — Z79891 Long term (current) use of opiate analgesic: Secondary | ICD-10-CM | POA: Diagnosis not present

## 2017-10-21 DIAGNOSIS — L03115 Cellulitis of right lower limb: Secondary | ICD-10-CM | POA: Diagnosis not present

## 2017-10-21 DIAGNOSIS — Z9981 Dependence on supplemental oxygen: Secondary | ICD-10-CM | POA: Diagnosis not present

## 2017-10-21 DIAGNOSIS — I480 Paroxysmal atrial fibrillation: Secondary | ICD-10-CM | POA: Diagnosis not present

## 2017-10-21 DIAGNOSIS — J439 Emphysema, unspecified: Secondary | ICD-10-CM | POA: Diagnosis not present

## 2017-10-21 DIAGNOSIS — E039 Hypothyroidism, unspecified: Secondary | ICD-10-CM | POA: Diagnosis not present

## 2017-10-26 ENCOUNTER — Ambulatory Visit: Payer: Medicare Other | Admitting: Physician Assistant

## 2017-10-26 ENCOUNTER — Other Ambulatory Visit: Payer: Self-pay | Admitting: Internal Medicine

## 2017-10-26 ENCOUNTER — Encounter: Payer: Self-pay | Admitting: Physician Assistant

## 2017-10-26 VITALS — BP 126/58 | HR 90 | Ht 63.0 in | Wt 105.0 lb

## 2017-10-26 DIAGNOSIS — I5032 Chronic diastolic (congestive) heart failure: Secondary | ICD-10-CM

## 2017-10-26 DIAGNOSIS — I482 Chronic atrial fibrillation, unspecified: Secondary | ICD-10-CM

## 2017-10-26 DIAGNOSIS — N179 Acute kidney failure, unspecified: Secondary | ICD-10-CM | POA: Diagnosis not present

## 2017-10-26 DIAGNOSIS — I10 Essential (primary) hypertension: Secondary | ICD-10-CM

## 2017-10-26 NOTE — Patient Instructions (Signed)
Medication Instructions:  Your physician recommends that you continue on your current medications as directed. Please refer to the Current Medication list given to you today.  Labwork: Your physician recommends that you return for lab work in: TODAY-BMET  Testing/Procedures: None   Follow-Up: Keep upcoming appointment with Dr Stanford Breed as scheduled  Any Other Special Instructions Will Be Listed Below (If Applicable). If you need a refill on your cardiac medications before your next appointment, please call your pharmacy.

## 2017-10-26 NOTE — Progress Notes (Signed)
Cardiology Office Note    Date:  10/26/2017   ID:  Andrea [name], DOB [birth year], MRN [id number]  PCP:  Unk Pinto, MD  Cardiologist:  Dr. Stanford Breed  Chief Complaint  Patient presents with  . Follow-up    2-3 weeks. Leg swelling  . Edema    Feet and ankles.    History of Present Illness:  Andrea [name] is a 84 y.o. female with PMH of HTN, palpitation, chronic diastolic HF, AI and PAF on eliquis.MyoviewOctober 2009 showed EF 73% and normal perfusion. CardioNet in February 2014 showed sinus rhythm, PACs and PVCs with occasional junctional escape beats and pauses greater than 3 seconds. Cardizem was discontinued and Norvasc added. Heart monitor in January 2017 shows sinus rhythm with PACs and brief PAT. Echocardiogram in July 2018 shows normal LV function, mild AI, mild MR, moderate tricuspid regurgitation, mild-to-moderatelyelevated pulmonary pressure. Venous Dopplers in August 2018 showed no DVT. Patient had small bowel obstruction in 03/2017 and developed SVT/atrial fibrillation and started on cardizem again. She was converted to sinus rhythm with metoprolol and Cardizem. She was placed on eliquis. Since her last visit in August, she has contacted the cardiology service on 05/31/2017 complaining of feeling bad on long-acting diltiazem, he has since been transitioned to short-acting diltiazem.    She temporarily held her Eliquis due to laceration of her right heel while getting out of the car in September 2018.  Unfortunately, she was admitted in October as she has progressed to cellulitis extending from the laceration wound.  She ended up having sharp debridement of the right leg wound by surgery for necrotic infected wound.  She was seen in the ED for diarrhea in November, also treated for C. difficile as outpatient.  She was taken off of Lasix due to worsening renal function.  She returned in early December complaining of worsening lower extremity edema and found to have large bilateral  pleural effusion with sial volume overload.  She was started on IV Lasix.  Repeat echocardiogram in November 2018 showed EF 60-65%, grade 2 DD, mild AI, mild MR, moderate TR, PA peak pressure 61 mmHg, small pericardial effusion.  Her most recent admission was in late December 2018 for shortness of breath, nausea/vomiting, and abdominal distention.  EKG on arrival revealed atrial fibrillation with RVR.  CT of abdomen also revealed high-grade partial small bowel obstruction involving the mid to distal ileum, likely due to adhesions. Ascites was also noted.  She was also found to have bilateral moderate pleural effusion.  She was treated with IV antibiotics and is slowly progressed to liquid diet.  She eventually underwent left thoracentesis with removal of 1.3 L of fluid.  She was discharged on Lasix 40 mg twice daily.  I last saw the patient on 09/30/2017, her right lower extremity wound is well-healed, however she has chronic left lower extremity edema.  As a trial I increased her Lasix to 80 mg a.m. and 40 mg p.m. for 3 days before going back to 40 mg twice daily dosing.  Last lab obtained on 10/13/2017 showed creatinine increased to 1.4.  She also ambulated in her primary care providers office, O2 saturation went down to the 70s with exertion.  She has been placed on home oxygen.  Otherwise, she denies any orthopnea or PND.  She has no chest pain recently.  I will obtain a basic metabolic panel today, otherwise patient has been fairly stable from cardiac perspective now she is on oxygen.   Past Medical History:  Diagnosis Date  . Aortic regurgitation   . Bronchiectasis ([place])   . COPD (chronic obstructive pulmonary disease) ([place])   . DJD (degenerative joint disease)   . HLD (hyperlipidemia)   . HTN (hypertension)   . Hypothyroidism   . Palpitations    a. has known history of PAC's and PVC's. b. 09/2015: monitor showed sinus rhythm with occasional PAC's and a brief episode of PAT.    Past Surgical  History:  Procedure Laterality Date  . ABDOMINAL HYSTERECTOMY  1993  . CATARACT EXTRACTION, BILATERAL    . WOUND DEBRIDEMENT Right 06/29/2017   Procedure: RIGHT LOWER  LEG DEBRIDEMENT;  Surgeon: [name], MD;  Location: [facility];  Service: General;  Laterality: Right;    Current Medications: Outpatient Medications Prior to Visit  Medication Sig Dispense Refill  . acetaZOLAMIDE (DIAMOX) 250 MG tablet Take 1 tablet (250 mg total) by mouth daily. 90 tablet 1  . ALPRAZolam (XANAX) 0.25 MG tablet Take 0.125-0.25 mg by mouth at bedtime.     [name] apixaban (ELIQUIS) 2.5 MG TABS tablet Take 1 tablet (2.5 mg total) by mouth 2 (two) times daily. 180 tablet 3  . diltiazem (CARDIZEM) 60 MG tablet Take 60 mg by mouth 4 (four) times daily.     . furosemide (LASIX) 40 MG tablet TAKE 1 TABLET (40 MG TOTAL) BY MOUTH TWICE DAILY. 60 tablet 0  . ipratropium-albuterol (DUONEB) 0.5-2.5 (3) MG/3ML SOLN Take 3 mLs by nebulization 2 (two) times daily.    [name] levothyroxine (SYNTHROID, LEVOTHROID) 50 MCG tablet TAKE ONE TABLET BY MOUTH ONCE A DAY AS DIRECTED 90 tablet 1  . metoprolol tartrate (LOPRESSOR) 50 MG tablet TAKE 1 TABLET (50 MG TOTAL) BY MOUTH TWICE DAILY. 180 tablet 1  . omeprazole (PRILOSEC) 40 MG capsule Take 1 capsule (40 mg total) by mouth daily. 90 capsule 1  . Probiotic Product (PROBIOTIC-10) CHEW Chew 2 tablets by mouth daily.    [name] Respiratory Therapy Supplies (FLUTTER) DEVI Use as directed. 1 each 0  . vancomycin (VANCOCIN) 50 mg/mL oral solution Take 153m po QIDx7 days then 126mBIDx7 days then 12512mailyx7 days then 125m5mery other day x 7days then 125mg87mays x7 days 140 mL 0   No facility-administered medications prior to visit.      Allergies:   Vancomycin and Sulfa antibiotics   Social History   Socioeconomic History  . Marital status: Married    Spouse name: None  . Number of children: 5  . Years of education: None  . Highest education level: None  Social Needs  .  Financial resource strain: None  . Food insecurity - worry: None  . Food insecurity - inability: None  . Transportation needs - medical: None  . Transportation needs - non-medical: None  Occupational History  . Occupation: retired  Tobacco Use  . Smoking status: Former Smoker    Packs/day: 1.00    Years: 20.00    Pack years: 20.00    Types: Cigarettes    Last attempt to quit: 09/27/1974    Years since quitting: 43.1  . Smokeless tobacco: Never Used  Substance and Sexual Activity  . Alcohol use: No  . Drug use: No  . Sexual activity: No  Other Topics Concern  . None  Social History Narrative  . None     Family History:  The patient's family history includes Asthma in her brother; Cancer in her father; Diabetes in her brother, mother, and sister; Emphysema in her mother, sister,  and sister; Heart attack in her sister; Rheumatic fever in her sister.   ROS:   Please see the history of present illness.    ROS All other systems reviewed and are negative.   PHYSICAL EXAM:   VS:  BP (!) 126/58   Pulse 90   Ht _0  (1.6 m)   Wt 105 lb (47.6 kg)   BMI 18.60 kg/m    GEN: Well nourished, well developed, in no acute distress  HEENT: normal  Neck: no JVD, carotid bruits, or masses Cardiac: irregularly irregular; no murmurs, rubs, or gallops.  Right leg wound well-healed, left lower extremity 2+ edema  Respiratory: Decreased breath sounds in bilateral bases GI: soft, nontender, nondistended, + BS MS: no deformity or atrophy  Skin: warm and dry, no rash Neuro:  Alert and Oriented x 3, Strength and sensation are intact Psych: euthymic mood, full affect  Wt Readings from Last 3 Encounters:  10/26/17 105 lb (47.6 kg)  10/19/17 107 lb (48.5 kg)  10/13/17 107 lb (48.5 kg)      Studies/Labs Reviewed:   EKG:  EKG is not ordered today.    Recent Labs: 09/15/2017: Magnesium 1.8; TSH 4.511 09/16/2017: ALT 14 10/13/2017: Brain Natriuretic Peptide 813; BUN 37; Creat 1.43;  Hemoglobin 11.2; Platelets 277; Potassium 4.0; Sodium 141   Lipid Panel    Component Value Date/Time   CHOL 211 (H) 08/16/2017 1532   TRIG 97 08/16/2017 1532   HDL 70 08/16/2017 1532   CHOLHDL 3.0 08/16/2017 1532   VLDL 14 01/24/2017 1552   LDLCALC 104 (H) 01/24/2017 1552    Additional studies/ records that were reviewed today include:   Echo 08/26/2017 Study Conclusions  - Left ventricle: The cavity size was normal. Wall thickness was normal. Systolic function was normal. The estimated ejection fraction was in the range of 60% to 65%. Wall motion was normal; there were no regional wall motion abnormalities. Features are consistent with a pseudonormal left ventricular filling pattern, with concomitant abnormal relaxation and increased filling pressure (grade 2 diastolic dysfunction). - Aortic valve: Mildly calcified annulus. Trileaflet. There was mild regurgitation. - Mitral valve: There was mild regurgitation. - Left atrium: The atrium was at the upper limits of normal in size. - Right atrium: Central venous pressure (est): 3 mm Hg. - Tricuspid valve: There was moderate regurgitation. - Pulmonary arteries: PA peak pressure: 61 mm Hg (S). - Pericardium, extracardiac: A small pericardial effusion was identified. There was a right pleural effusion. There was a left pleural effusion.  Impressions:  - Normal LV wall thickness with LVEF 60-65% and grade 2 diastolic dysfunction. Upper normal atrial chamber size. Mild mitral regurgitation. Mildly calcified aortic valve with mild aortic regurgitation. Moderate tricuspid regurgitation with evidence of moderate to severe pulmonary hypertension and PASP 61 mmHg. Small pericardial effusion noted. Bilateral pleural effusions noted.   ASSESSMENT:    1. Chronic diastolic heart failure ([place])   2. AKI (acute kidney injury) ([place])   3. Chronic atrial fibrillation ([place])   4. Essential hypertension       PLAN:  In order of problems listed above:  1. Chronic diastolic heart failure: Appears to be stable, continue to have small amount of bilateral pleural effusion based on physical exam and the recent chest x-ray.  Has 2+ left lower extremity edema which is chronic.  We will continue on 40 mg twice daily of Lasix.   2. Persistent atrial fibrillation: On Eliquis, rate controlled on diltiazem and metoprolol.  Intolerant of long-acting  diltiazem, she is doing well on the short acting diltiazem.  Heart monitor in 2014 showed junctional escape beat and prolonged sinus pauses, as long as her heart rate is less than 100, will hold off on increasing rate control therapy.  3. Hypertension: Blood pressure is well controlled 126/58.    Medication Adjustments/Labs and Tests Ordered: Current medicines are reviewed at length with the patient today.  Concerns regarding medicines are outlined above.  Medication changes, Labs and Tests ordered today are listed in the Patient Instructions below. Patient Instructions  Medication Instructions:  Your physician recommends that you continue on your current medications as directed. Please refer to the Current Medication list given to you today.  Labwork: Your physician recommends that you return for lab work in: TODAY-BMET  Testing/Procedures: None   Follow-Up: Keep upcoming appointment with Dr Stanford Breed as scheduled  Any Other Special Instructions Will Be Listed Below (If Applicable). If you need a refill on your cardiac medications before your next appointment, please call your pharmacy.     Hilbert Corrigan, [place]  10/26/2017 12:51 PM    [facility] [place], [place], [place]  [address] Phone: [phone]; Fax: [phone]

## 2017-10-27 LAB — BASIC METABOLIC PANEL
BUN/Creatinine Ratio: 32 — ABNORMAL HIGH (ref 12–28)
BUN: 34 mg/dL — ABNORMAL HIGH (ref 8–27)
CALCIUM: 9.1 mg/dL (ref 8.7–10.3)
CO2: 31 mmol/L — AB (ref 20–29)
CREATININE: 1.06 mg/dL — AB (ref 0.57–1.00)
Chloride: 97 mmol/L (ref 96–106)
GFR calc Af Amer: 55 mL/min/{1.73_m2} — ABNORMAL LOW (ref >59)
GFR, EST NON AFRICAN AMERICAN: 48 mL/min/{1.73_m2} — AB (ref >59)
GLUCOSE: 47 mg/dL — AB (ref 65–99)
POTASSIUM: 3.6 mmol/L (ref 3.5–5.2)
Sodium: 141 mmol/L (ref 134–144)

## 2017-11-01 ENCOUNTER — Telehealth: Payer: Self-pay | Admitting: Internal Medicine

## 2017-11-01 DIAGNOSIS — J9611 Chronic respiratory failure with hypoxia: Secondary | ICD-10-CM

## 2017-11-01 NOTE — Telephone Encounter (Signed)
Per TP verbally- order ONO on 2L to ensure that 2L is adequate at bedtime. Will order ONO after speaking with pt's daughter, [name]. lmtcb x1 for [employer]

## 2017-11-01 NOTE — Telephone Encounter (Signed)
Per TP: do they need to ONO to see if patient needs O2 at bedtime or to see if 2lpm is adequate at bedtime?  Thanks.

## 2017-11-01 NOTE — Telephone Encounter (Signed)
Called and spoke with pt's daughter, [name]. [name] states that TP had mentioned doing ONO during 10/19/17 OV.  It does not appear that ONO was ordered.  TP please advise if okay to order ONO. Thanks.

## 2017-11-02 NOTE — Telephone Encounter (Signed)
Pt daughter Andrea [name] is calling back [phone]

## 2017-11-02 NOTE — Telephone Encounter (Signed)
Spoke with the pt's daughter, [name] and notified of recs per TP  She verbalized understanding  Order was sent to [facility]

## 2017-11-03 ENCOUNTER — Telehealth: Payer: Self-pay

## 2017-11-03 DIAGNOSIS — I5032 Chronic diastolic (congestive) heart failure: Secondary | ICD-10-CM

## 2017-11-03 MED ORDER — POTASSIUM CHLORIDE ER 10 MEQ PO TBCR: 10.0000 meq | EXTENDED_RELEASE_TABLET | Freq: Every day | ORAL | 2 refills | Status: DC

## 2017-11-03 NOTE — Telephone Encounter (Signed)
-----  Message from [place], [place] sent at 10/31/2017 12:54 PM EST ----- Kidney function improved. Potassium is within normal range, but almost near the lower border, dropped from previous 4.0 down to 3.6, recommend start 10 meq of K-Dur potassium supplement and recheck BMET in a month

## 2017-11-03 NOTE — Telephone Encounter (Signed)
Spoke with patient and informed her that her potassium was borderline normal low and the recommendations from [place]. She voiced understanding and stated she come next month for a repeat bmet.

## 2017-11-14 ENCOUNTER — Encounter: Payer: Self-pay | Admitting: Physician Assistant

## 2017-11-14 NOTE — Progress Notes (Signed)
6 MONTH FOLLOW UP AND CPE  Assessment and Plan:  Essential hypertension - continue medications, DASH diet, exercise and monitor at home. Call if greater than 130/80.  -     CBC with Differential/Platelet -     BASIC METABOLIC PANEL WITH GFR -     Hepatic function panel -     TSH  Pulmonary hypertension due to COPD ([place])  cont to monitor/follow up/O2  Chronic diastolic CHF (congestive heart failure) (HCC) Weight stable, monitor, has not taken lasix today  Atrial tachycardia (HCC) Continue meds  Aortic atherosclerosis (HCC) Control blood pressure, cholesterol, glucose, increase exercise.   AF (paroxysmal atrial fibrillation) (HCC) Continue elliquis for now, check CBC, iron  Other emphysema (HCC) No triggers, well controlled symptoms, cont to monitor  Chronic respiratory failure with hypoxia ([place]) cont to monitor/follow up/O2 Long discussion about CO2 retention and O2 goal  Other specified hypothyroidism Hypothyroidism-check TSH level, continue medications the same, reminded to take on an empty stomach 30-3mns before food.  -     TSH  Medication management -     Magnesium  Hyperlipidemia, unspecified hyperlipidemia type -continue medications, check lipids, decrease fatty foods, increase activity.  -     Lipid panel  Iron deficiency -     Iron,Total/Total Iron Binding Cap  Skin cancer Monitor  Osteoporosis, unspecified osteoporosis type, unspecified pathological fracture presence Monitor, declines DEXA  Renal insufficiency avoid NSAIDS, will monitor  Osteoarthritis of shoulder, unspecified laterality, unspecified osteoarthritis type Monitor  Vitamin D deficiency Supplement  Protein-calorie malnutrition, severe (HCC) Samples, get on ensure once daily  Lung nodules Monitor  Discussed med's effects and SE's. Screening labs and tests as requested with regular follow-up as recommended. Future Appointments  Date Time Provider DLott  11/21/2017  1:30 PM BCollene Gobble MD [facility] None  01/02/2018 10:40 AM CLelon Perla [facility] 11/23/2018 10:00 AM CVicie Mutters PA-C [facility] None    HPI  84y.o. female  presents for 6 month follow up and CPE  She admits to falling 2-3 weeks ago, states hurt her left hip and her tail bone. She has pain at her rectum with lying in bed at night but states she has no pain with walking.    Her blood pressure has been controlled at home, today their BP is BP: 124/76   She does workout. She denies chest pain, shortness of breath, dizziness.  She has COPD and follows with Dr. YAnnamaria Boots  she is now on O2 at night and during the day with exertion. She is on diamox for CO2 retention, she has pulse ox at home and tries to keep O2 close to 95, I have discussed with her again our goal is closer to 92%.   She complains her memory is not as good. She does not move much during the day, she walks with a cane. She dresses and bathes herself, but has a lot trouble with doing it. She will do a lot of "pan" bathing. Her girls bring a lot of food for her, but she will cook a lot of canned/frozen food. She lives at home.    Patient has advanced directive but she does not have DNR. We discussed DNR, she will think about it. We discussed that her health will not likely improve but she does want to stay in the home and try to get stronger. Offered PT, she declined at this time. We touched on death and dying, she states she is afraid, we  discussed what she was afraid of with breathing issues and other issues, we addressed these and she knows we can discuss further if she wants.  We discussed hospice referral and she declines at this time, but she will consider it.   She also follows with Dr. Stanford Breed for aortic insuff, Afib, diastolic CHF. She has history of peripheral edema, and recurrent pleural effusion s/p thoracentesis. She is on lasix 40 mg BID and potassium. .   Lab Results  Component  Value Date   GFRNONAA 48 (L) 10/26/2017   She is following Dr. Scherrie November for her eyes.  She is not on cholesterol medication and denies myalgias. Her cholesterol is at goal. The cholesterol last visit was:   Lab Results  Component Value Date   CHOL 211 (H) 08/16/2017   HDL 70 08/16/2017   LDLCALC 104 (H) 01/24/2017   TRIG 97 08/16/2017   CHOLHDL 3.0 08/16/2017    She has been working on diet and exercise for prediabetes, and denies paresthesia of the feet, polydipsia, polyuria and visual disturbances. Last A1C in the office was:  Lab Results  Component Value Date   HGBA1C 5.4 01/24/2017   Patient is on Vitamin D supplement.   Lab Results  Component Value Date   VD25OH 52 01/24/2017     She is on thyroid medication. Her medication was changed last visit, she is on 1 pill daily.  Lab Results  Component Value Date   TSH 4.511 (H) 09/15/2017   BMI is Body mass index is 19.24 kg/m., she is working on diet and exercise. Wt Readings from Last 3 Encounters:  11/16/17 108 lb 9.6 oz (49.3 kg)  10/26/17 105 lb (47.6 kg)  10/19/17 107 lb (48.5 kg)    Current Medications:  Current Outpatient Medications on File Prior to Visit  Medication Sig Dispense Refill  . acetaZOLAMIDE (DIAMOX) 250 MG tablet Take 1 tablet (250 mg total) by mouth daily. 90 tablet 1  . ALPRAZolam (XANAX) 0.25 MG tablet Take 0.125-0.25 mg by mouth at bedtime.     [name] apixaban (ELIQUIS) 2.5 MG TABS tablet Take 1 tablet (2.5 mg total) by mouth 2 (two) times daily. 180 tablet 3  . diltiazem (CARDIZEM) 60 MG tablet Take 60 mg by mouth 4 (four) times daily.     . furosemide (LASIX) 40 MG tablet TAKE 1 TABLET (40 MG TOTAL) BY MOUTH TWICE DAILY. 60 tablet 0  . ipratropium-albuterol (DUONEB) 0.5-2.5 (3) MG/3ML SOLN Take 3 mLs by nebulization 2 (two) times daily.    [name] levothyroxine (SYNTHROID, LEVOTHROID) 50 MCG tablet TAKE ONE TABLET BY MOUTH ONCE A DAY AS DIRECTED 90 tablet 1  . metoprolol tartrate (LOPRESSOR) 50 MG tablet TAKE  1 TABLET (50 MG TOTAL) BY MOUTH TWICE DAILY. 180 tablet 1  . omeprazole (PRILOSEC) 40 MG capsule Take 1 capsule (40 mg total) by mouth daily. 90 capsule 1  . potassium chloride (K-DUR) 10 MEQ tablet Take 1 tablet (10 mEq total) by mouth daily. 30 tablet 2  . Probiotic Product (PROBIOTIC-10) CHEW Chew 2 tablets by mouth daily.    [name] Respiratory Therapy Supplies (FLUTTER) DEVI Use as directed. 1 each 0  . vancomycin (VANCOCIN) 50 mg/mL oral solution Take 124m po QIDx7 days then 1274mBIDx7 days then 12560mailyx7 days then 125m7mery other day x 7days then 125mg8mays x7 days 140 mL 0   No current facility-administered medications on file prior to visit.    Health Maintenance:   Immunization History  Administered  Date(s) Administered  . Influenza Split 07/16/2013  . Influenza, High Dose Seasonal PF 06/25/2015, 06/10/2016, 06/30/2017  . Influenza,inj,Quad PF,6+ Mos 07/04/2014  . Pneumococcal Conjugate-13 02/26/2014  . Pneumococcal Polysaccharide-23 10/04/2016  . Td 06/26/2013  . Tdap 06/19/2017   Last colonoscopy: 2008, declines another Last mammogram: 07/06/2013, declines another Last pap smear/pelvic exam: remote, declines another DEXA: 07/06/2013 + osteoporosis CXR 05/2016 Echo 03/2016 Dr. Stanford Breed  Prior vaccinations: TD or Tdap: 2014 Influenza: 05/2017 Pneumococcal: 2008 Prevnar 13: 02/2014 Shingles/Zostavax: declines  1. [facility]- here for primary care 2. Dr. Scherrie November, eye doctor, last visit April 2017 3. unknown, dentist, last visit 2 years Patient Care Team: Unk Pinto, MD as PCP - General (Internal Medicine) Stanford Breed Denice Bors, MD as PCP - Cardiology (Cardiology) Deneise Lever, MD as Consulting Physician (Pulmonary Disease) Lelon Perla, MD as Consulting Physician (Cardiology) Inda Castle, MD (Inactive) as Consulting Physician (Gastroenterology) Rolm Bookbinder, MD as Consulting Physician  (Dermatology)  Medical History:  Past Medical History:  Diagnosis Date  . Aortic regurgitation   . Bronchiectasis ([place])   . COPD (chronic obstructive pulmonary disease) ([place])   . DJD (degenerative joint disease)   . HLD (hyperlipidemia)   . HTN (hypertension)   . Hypothyroidism   . Palpitations    a. has known history of PAC's and PVC's. b. 09/2015: monitor showed sinus rhythm with occasional PAC's and a brief episode of PAT.  . S/P thoracentesis    Allergies Allergies  Allergen Reactions  . Vancomycin     Worsening kidney function  . Sulfa Antibiotics Palpitations    Unknown    SURGICAL HISTORY She  has a past surgical history that includes Abdominal hysterectomy (1993); Cataract extraction, bilateral; and Wound debridement (Right, 06/29/2017). FAMILY HISTORY Her family history includes Asthma in her brother; Cancer in her father; Diabetes in her brother, mother, and sister; Emphysema in her mother, sister, and sister; Heart attack in her sister; Rheumatic fever in her sister. SOCIAL HISTORY She  reports that she quit smoking about 43 years ago. Her smoking use included cigarettes. She has a 20.00 pack-year smoking history. she has never used smokeless tobacco. She reports that she does not drink alcohol or use drugs.    Review of Systems  Constitutional: Positive for malaise/fatigue. Negative for chills, diaphoresis, fever and weight loss.  HENT: Positive for hearing loss (going to see ENT). Negative for congestion, ear discharge and sore throat.   Respiratory: Positive for shortness of breath. Negative for cough, hemoptysis, sputum production and wheezing.   Cardiovascular: Positive for leg swelling. Negative for chest pain and palpitations.  Gastrointestinal: Negative for blood in stool, constipation, diarrhea and melena.  Genitourinary: Negative.   Musculoskeletal: Negative.   Skin: Negative.   Neurological: Negative for dizziness, sensory change, loss of consciousness,  weakness and headaches.  Psychiatric/Behavioral: Negative for depression. The patient is not nervous/anxious and does not have insomnia.      Physical Exam: Estimated body mass index is 19.24 kg/m as calculated from the following:   Height as of this encounter: _0  (1.6 m).   Weight as of this encounter: 108 lb 9.6 oz (49.3 kg). BP 124/76   Pulse 61   Temp (!) 97.3 F (36.3 C)   Resp 16   Ht _1  (1.6 m)   Wt 108 lb 9.6 oz (49.3 kg)   SpO2 (!) 81% Comment: w/ 02 tank  BMI 19.24 kg/m  General Appearance: Malnourished/underweight in no apparent distress.  Eyes: PERRLA, EOMs, conjunctiva no swelling or erythema, normal fundi and vessels.  Sinuses: No Frontal/maxillary tenderness  ENT/Mouth: Ext aud canals clear on the right but with cerumen impaction on the left, normal light reflex with TMs without erythema, bulging. Good dentition. No erythema, swelling, or exudate on post pharynx. Tonsils not swollen or erythematous. Hearing decreased Neck: Supple, thyroid normal. No bruits  Respiratory: Respiratory effort normal, decreased breathsounds, BS equal bilaterally without rales, rhonchi, wheezing or stridor.  Cardio: RRR with holosystolic 2/6, prominent S2. Brisk peripheral pulses with 3+ edema, left leg worse than right leg  Chest: symmetric, with normal excursions and percussion.   Abdomen: Soft, nontender, no guarding, rebound, hernias, masses, or organomegaly. .  Lymphatics: Non tender without lymphadenopathy.  Musculoskeletal: Full ROM all peripheral extremities,4/5 strength, and antalgic gait with cane Skin: Warm, dry without rashes, lesions, ecchymosis. Neuro: Cranial nerves intact, reflexes equal bilaterally. Decreased muscle tone, no cerebellar symptoms. [name]  Psych: Awake and oriented X 3, normal affect, Insight and Judgment appropriate.    Vicie Mutters 11:36 AM [facility]

## 2017-11-15 ENCOUNTER — Other Ambulatory Visit: Payer: Self-pay | Admitting: *Deleted

## 2017-11-15 MED ORDER — APIXABAN 2.5 MG PO TABS: 2.5000 mg | ORAL_TABLET | Freq: Two times a day (BID) | ORAL | 3 refills | Status: AC

## 2017-11-16 ENCOUNTER — Ambulatory Visit: Payer: Medicare Other | Admitting: Emergency Medicine

## 2017-11-16 ENCOUNTER — Ambulatory Visit (INDEPENDENT_AMBULATORY_CARE_PROVIDER_SITE_OTHER): Payer: Medicare Other | Admitting: Physician Assistant

## 2017-11-16 ENCOUNTER — Encounter: Payer: Self-pay | Admitting: Physician Assistant

## 2017-11-16 VITALS — BP 124/76 | HR 61 | Temp 97.3°F | Resp 16 | Ht 63.0 in | Wt 108.6 lb

## 2017-11-16 DIAGNOSIS — E039 Hypothyroidism, unspecified: Secondary | ICD-10-CM | POA: Diagnosis not present

## 2017-11-16 DIAGNOSIS — R918 Other nonspecific abnormal finding of lung field: Secondary | ICD-10-CM

## 2017-11-16 DIAGNOSIS — I5032 Chronic diastolic (congestive) heart failure: Secondary | ICD-10-CM

## 2017-11-16 DIAGNOSIS — I2723 Pulmonary hypertension due to lung diseases and hypoxia: Secondary | ICD-10-CM

## 2017-11-16 DIAGNOSIS — M19019 Primary osteoarthritis, unspecified shoulder: Secondary | ICD-10-CM

## 2017-11-16 DIAGNOSIS — J9611 Chronic respiratory failure with hypoxia: Secondary | ICD-10-CM

## 2017-11-16 DIAGNOSIS — E559 Vitamin D deficiency, unspecified: Secondary | ICD-10-CM

## 2017-11-16 DIAGNOSIS — Z0001 Encounter for general adult medical examination with abnormal findings: Secondary | ICD-10-CM

## 2017-11-16 DIAGNOSIS — Z0000 Encounter for general adult medical examination without abnormal findings: Secondary | ICD-10-CM | POA: Diagnosis not present

## 2017-11-16 DIAGNOSIS — E43 Unspecified severe protein-calorie malnutrition: Secondary | ICD-10-CM | POA: Diagnosis not present

## 2017-11-16 DIAGNOSIS — J449 Chronic obstructive pulmonary disease, unspecified: Secondary | ICD-10-CM

## 2017-11-16 DIAGNOSIS — I10 Essential (primary) hypertension: Secondary | ICD-10-CM

## 2017-11-16 DIAGNOSIS — Z79899 Other long term (current) drug therapy: Secondary | ICD-10-CM | POA: Diagnosis not present

## 2017-11-16 DIAGNOSIS — N289 Disorder of kidney and ureter, unspecified: Secondary | ICD-10-CM

## 2017-11-16 DIAGNOSIS — J438 Other emphysema: Secondary | ICD-10-CM

## 2017-11-16 DIAGNOSIS — C4490 Unspecified malignant neoplasm of skin, unspecified: Secondary | ICD-10-CM

## 2017-11-16 DIAGNOSIS — I471 Supraventricular tachycardia: Secondary | ICD-10-CM

## 2017-11-16 DIAGNOSIS — S81801A Unspecified open wound, right lower leg, initial encounter: Secondary | ICD-10-CM | POA: Diagnosis not present

## 2017-11-16 DIAGNOSIS — Z681 Body mass index (BMI) 19 or less, adult: Secondary | ICD-10-CM

## 2017-11-16 DIAGNOSIS — Z8719 Personal history of other diseases of the digestive system: Secondary | ICD-10-CM

## 2017-11-16 DIAGNOSIS — D649 Anemia, unspecified: Secondary | ICD-10-CM

## 2017-11-16 DIAGNOSIS — I4719 Other supraventricular tachycardia: Secondary | ICD-10-CM

## 2017-11-16 DIAGNOSIS — I480 Paroxysmal atrial fibrillation: Secondary | ICD-10-CM

## 2017-11-16 DIAGNOSIS — E782 Mixed hyperlipidemia: Secondary | ICD-10-CM

## 2017-11-16 DIAGNOSIS — J90 Pleural effusion, not elsewhere classified: Secondary | ICD-10-CM

## 2017-11-16 DIAGNOSIS — I700 Atherosclerosis of aorta: Secondary | ICD-10-CM

## 2017-11-16 DIAGNOSIS — J479 Bronchiectasis, uncomplicated: Secondary | ICD-10-CM

## 2017-11-16 DIAGNOSIS — M810 Age-related osteoporosis without current pathological fracture: Secondary | ICD-10-CM

## 2017-11-16 NOTE — Patient Instructions (Addendum)
Try not take the prilosec daily Take as needed for heart burn Taking it daily can cause worsening kidney function, and decrease of minerals.   Weight self daily at home  Get left ear cleaned out  IF YOU WANT PHYSICAL THERAPY OR RESPIRATORY THERAPY OR BOTH TO COME TO THE HOUSE LET ME KNOW   VENOUS INSUFFICIENCY Our lower leg venous system is not the most reliable, the heart does NOT pump fluid up, there is a valve system.  The muscles of the leg squeeze and the blood moves up and a valve opens and close, then they squeeze, blood moves up and valves open and closes keeping the blood moving towards the heart.  Lots can go wrong with this valve system.  If someone is sitting or standing without movement, everyone will get swelling.  THINGS TO DO:  Do not stand or sit in one position for long periods of time. Do not sit with your legs crossed. Rest with your legs raised during the day.  Your legs have to be higher than your heart so that gravity will force the valves to open, so please really elevate your legs.   Wear elastic stockings or support hose. Do not wear other tight, encircling garments around the legs, pelvis, or waist.  [employer]  has a wide variety of well priced compression stockings. [place], [place] [address] [address] [place] has a good cheap selection, I like the socks, they are not as hard to get on  Walk as much as possible to increase blood flow.  Raise the foot of your bed at night with 2-inch blocks.  SEEK MEDICAL CARE IF:   The skin around your ankle starts to break down.  You have pain, redness, tenderness, or hard swelling developing in your leg over a vein.  You are uncomfortable due to leg pain.  If you ever have shortness of breath with exertion or chest pain go to the ER.   Hospice Introduction Hospice is a service that is designed to provide people who are terminally ill and their families with medical, spiritual,  and psychological support. Its aim is to improve your quality of life by keeping you as alert and comfortable as possible. Who will be my providers when I begin hospice care? Hospice teams often include:  A nurse.  A doctor. The hospice doctor will be available for your care, but you can bring your regular doctor or nurse practitioner.  Social workers.  Religious leaders (such as a Clinical biochemist).  Trained volunteers.  What roles will providers play in my care? Hospice is performed by a team of health care professionals and volunteers who:  Help keep you comfortable: ? Hospice can be provided in your home or in a homelike setting. ? The hospice staff works with your family and friends to help meet your needs. ? You will enjoy the support of loved ones by receiving much of your basic care from family and friends.  Provide pain relief and manage your symptoms. The staff supply all necessary medicines and equipment.  Provide companionship when you are alone.  Allow you and your family to rest. They may do light housekeeping, prepare meals, and run errands.  Provide counseling. They will make sure your emotional, spiritual, and social needs and those of your family are being met.  Provide spiritual care: ? Spiritual care will be individualized to meet your needs and your family's needs. ? Spiritual care may involve:  Helping you look at what death means to you.  Helping you say goodbye to your family and friends.  Performing a specific religious ceremony or ritual.  When should hospice care begin? Most people who use hospice are believed to have fewer than 6 months to live.  Your family and health care providers can help you decide when hospice services should begin.  If your condition improves, you may discontinue the program.  What should I consider before selecting a program? Most hospice programs are run by nonprofit, independent organizations. Some are affiliated with  hospitals, nursing homes, or home health care agencies. Hospice programs can take place in the home or at a hospice center, hospital, or skilled nursing facility. When choosing a hospice program, ask the following questions:  What services are available to me?  What services will be offered to my loved ones?  How involved will my loved ones be?  How involved will my health care provider be?  Who makes up the hospice care team? How are they trained or screened?  How will my pain and symptoms be managed?  If my circumstances change, can the services be provided in a different setting, such as my home or in the hospital?  Is the program reviewed and licensed by the state or certified in some other way?  Where can I learn more about hospice? You can learn about existing hospice programs in your area from your health care providers. You can also read more about hospice online. The websites of the following organizations contain helpful information:  The [facility] and Palliative Care Organization [facility]).  The Hospice Association of America ([place]).  The [place].  The American Cancer Society (ACS).  Hospice Net.  This information is not intended to replace advice given to you by your health care provider. Make sure you discuss any questions you have with your health care provider. Document Released: 12/31/2003 Document Revised: 04/29/2016 Document Reviewed: 07/24/2013 Elsevier Interactive Patient Education  2017 [employer].

## 2017-11-17 LAB — CBC WITH DIFFERENTIAL/PLATELET
Basophils Absolute: 16 cells/uL (ref 0–200)
Basophils Relative: 0.2 %
EOS PCT: 1.2 %
Eosinophils Absolute: 98 cells/uL (ref 15–500)
HEMATOCRIT: 34.5 % — AB (ref 35.0–45.0)
HEMOGLOBIN: 10.9 g/dL — AB (ref 11.7–15.5)
LYMPHS ABS: 1476 {cells}/uL (ref 850–3900)
MCH: 28.5 pg (ref 27.0–33.0)
MCHC: 31.6 g/dL — ABNORMAL LOW (ref 32.0–36.0)
MCV: 90.1 fL (ref 80.0–100.0)
MPV: 12.3 fL (ref 7.5–12.5)
Monocytes Relative: 10.2 %
NEUTROS ABS: 5773 {cells}/uL (ref 1500–7800)
Neutrophils Relative %: 70.4 %
Platelets: 241 10*3/uL (ref 140–400)
RBC: 3.83 10*6/uL (ref 3.80–5.10)
RDW: 13.1 % (ref 11.0–15.0)
Total Lymphocyte: 18 %
WBC mixed population: 836 cells/uL (ref 200–950)
WBC: 8.2 10*3/uL (ref 3.8–10.8)

## 2017-11-17 LAB — BASIC METABOLIC PANEL WITH GFR
BUN/Creatinine Ratio: 25 (calc) — ABNORMAL HIGH (ref 6–22)
BUN: 30 mg/dL — ABNORMAL HIGH (ref 7–25)
CALCIUM: 9 mg/dL (ref 8.6–10.4)
CO2: 33 mmol/L — AB (ref 20–32)
Chloride: 98 mmol/L (ref 98–110)
Creat: 1.22 mg/dL — ABNORMAL HIGH (ref 0.60–0.88)
GFR, EST NON AFRICAN AMERICAN: 40 mL/min/{1.73_m2} — AB (ref >=60)
GFR, Est African American: 46 mL/min/{1.73_m2} — ABNORMAL LOW (ref >=60)
GLUCOSE: 76 mg/dL (ref 65–99)
POTASSIUM: 3.5 mmol/L (ref 3.5–5.3)
Sodium: 140 mmol/L (ref 135–146)

## 2017-11-17 LAB — HEPATIC FUNCTION PANEL
AG Ratio: 0.9 (calc) — ABNORMAL LOW (ref 1.0–2.5)
ALBUMIN MSPROF: 3.6 g/dL (ref 3.6–5.1)
ALT: 9 U/L (ref 6–29)
AST: 15 U/L (ref 10–35)
Alkaline phosphatase (APISO): 80 U/L (ref 33–130)
BILIRUBIN TOTAL: 0.3 mg/dL (ref 0.2–1.2)
Bilirubin, Direct: 0.1 mg/dL (ref 0.0–0.2)
Globulin: 3.8 g/dL (calc) — ABNORMAL HIGH (ref 1.9–3.7)
Indirect Bilirubin: 0.2 mg/dL (calc) (ref 0.2–1.2)
Total Protein: 7.4 g/dL (ref 6.1–8.1)

## 2017-11-17 LAB — IRON, TOTAL/TOTAL IRON BINDING CAP
%SAT: 10 % — AB (ref 11–50)
Iron: 41 ug/dL — ABNORMAL LOW (ref 45–160)
TIBC: 407 mcg/dL (calc) (ref 250–450)

## 2017-11-17 LAB — LIPID PANEL
Cholesterol: 196 mg/dL (ref <200)
HDL: 77 mg/dL (ref >50)
LDL Cholesterol (Calc): 100 mg/dL (calc) — ABNORMAL HIGH
Non-HDL Cholesterol (Calc): 119 mg/dL (calc) (ref <130)
TRIGLYCERIDES: 95 mg/dL (ref <150)
Total CHOL/HDL Ratio: 2.5 (calc) (ref <5.0)

## 2017-11-17 LAB — MAGNESIUM: MAGNESIUM: 2.1 mg/dL (ref 1.5–2.5)

## 2017-11-17 LAB — TSH: TSH: 3.66 mIU/L (ref 0.40–4.50)

## 2017-11-17 LAB — VITAMIN B12: Vitamin B-12: 417 pg/mL (ref 200–1100)

## 2017-11-21 ENCOUNTER — Ambulatory Visit: Payer: Medicare Other | Admitting: Emergency Medicine

## 2017-11-21 ENCOUNTER — Ambulatory Visit (HOSPITAL_COMMUNITY)
Admission: RE | Admit: 2017-11-21 | Discharge: 2017-11-21 | Disposition: A | Discharge status: Home / Self Care | Payer: Medicare Other | Source: Ambulatory Visit | Attending: Emergency Medicine | Admitting: Emergency Medicine

## 2017-11-21 ENCOUNTER — Encounter: Payer: Self-pay | Admitting: Emergency Medicine

## 2017-11-21 VITALS — BP 122/62 | HR 74 | Wt 107.0 lb

## 2017-11-21 DIAGNOSIS — M25552 Pain in left hip: Secondary | ICD-10-CM | POA: Diagnosis not present

## 2017-11-21 DIAGNOSIS — S79912A Unspecified injury of left hip, initial encounter: Secondary | ICD-10-CM | POA: Diagnosis not present

## 2017-11-21 DIAGNOSIS — Z9181 History of falling: Secondary | ICD-10-CM | POA: Insufficient documentation

## 2017-11-21 DIAGNOSIS — I5032 Chronic diastolic (congestive) heart failure: Secondary | ICD-10-CM | POA: Diagnosis not present

## 2017-11-21 DIAGNOSIS — J438 Other emphysema: Secondary | ICD-10-CM

## 2017-11-21 DIAGNOSIS — J9611 Chronic respiratory failure with hypoxia: Secondary | ICD-10-CM

## 2017-11-21 DIAGNOSIS — J479 Bronchiectasis, uncomplicated: Secondary | ICD-10-CM

## 2017-11-21 NOTE — Assessment & Plan Note (Signed)
Continue oxygen at 2 L/min at all times.  We will perform an overnight oximetry to ensure that 2 L is adequate with sleep.

## 2017-11-21 NOTE — Assessment & Plan Note (Signed)
She states that she does not make any sputum.  Is not having any cough.  I think at this time I will hold off on any chest imaging.  We can repeat visit as we go forward.

## 2017-11-21 NOTE — Assessment & Plan Note (Signed)
She has diastolic CHF.  They have a good calendar that indicates that she has been gaining weight, approximately 8-9 pounds over the last week.  She also has increased lower extremity edema, pleural effusions.  I am going to temporarily increase her Lasix, then go back down to 40 mg twice daily.  She has follow-up planned with Dr. Stanford Breed in the near future.

## 2017-11-21 NOTE — Assessment & Plan Note (Signed)
Continue DuoNeb twice a day.  Consider increasing to 4 times daily if her symptoms evolve.

## 2017-11-21 NOTE — Assessment & Plan Note (Signed)
Mechanical fall 2/24.  She impacted her sacrum, left hip.  She is still having pain.  We will perform x-rays of her pelvis, left hip, left lower extremity.  Forward this information to Dr. Melford Aase

## 2017-11-21 NOTE — Progress Notes (Signed)
Subjective:    Patient ID: Andrea [name], female    DOB: 28-Jul-1929, 84 y.o.   MRN: [id number]  HPI 84 yo woman, former smoker, with a history of hypertension, aortic regurgitation, atrial fibrillation, diastolic CHF.  She also carries a history of COPD and bronchiectasis.  She has chronic hypoxemic respiratory failure in the setting of all the above.  She was unfortunately admitted in December with an acute exacerbation of diastolic CHF, bilateral pleural effusions, course complicated by C. difficile colitis. She has been on supplemental O2, . Unfortunately she fell last pm, lost her balance, no LOC. She hit her L hip and sacrum.   She needs an overnight oximetry to establish that 2 L/min is adequate. She is on diamox, started for presumed hypercapnia, compensatory metabolic alkalosis. She has exertional SOB. No cough. No pred or abx since last hospitalization. She is having some wt gain, some increased LE edema (L > R). She is on lasix 71m bid.    Review of Systems   Past Medical History:  Diagnosis Date  . Aortic regurgitation   . [place]chiectasis ([place]   . COPD (chronic obstructive pulmonary disease) ([place]   . DJD (degenerative joint disease)   . HLD (hyperlipidemia)   . HTN (hypertension)   . Hypothyroidism   . Palpitations    a. has known history of PAC's and PVC's. b. 09/2015: monitor showed sinus rhythm with occasional PAC's and a brief episode of PAT.  . S/P thoracentesis      Family History  Problem Relation Age of Onset  . Emphysema Mother   . Diabetes Mother   . Cancer Father   . Emphysema Sister   . Emphysema Sister   . Rheumatic fever Sister   . Heart attack Sister   . Diabetes Sister   . Diabetes Brother   . Asthma Brother        as a child     Social History   Socioeconomic History  . Marital status: Married    Spouse name: Not on file  . Number of children: 5  . Years of education: Not on file  . Highest education level: Not on file  Social Needs    . Financial resource strain: Not on file  . Food insecurity - worry: Not on file  . Food insecurity - inability: Not on file  . Transportation needs - medical: Not on file  . Transportation needs - non-medical: Not on file  Occupational History  . Occupation: retired  Tobacco Use  . Smoking status: Former Smoker    Packs/day: 1.00    Years: 20.00    Pack years: 20.00    Types: Cigarettes    Last attempt to quit: 09/27/1974    Years since quitting: 43.1  . Smokeless tobacco: Never Used  Substance and Sexual Activity  . Alcohol use: No  . Drug use: No  . Sexual activity: No  Other Topics Concern  . Not on file  Social History Narrative  . Not on file     Allergies  Allergen Reactions  . Vancomycin     Worsening kidney function  . Sulfa Antibiotics Palpitations    Unknown     Outpatient Medications Prior to Visit  Medication Sig Dispense Refill  . acetaZOLAMIDE (DIAMOX) 250 MG tablet Take 1 tablet (250 mg total) by mouth daily. 90 tablet 1  . ALPRAZolam (XANAX) 0.25 MG tablet Take 0.125-0.25 mg by mouth at bedtime.     .Marland Kitchenapixaban (ELIQUIS)  2.5 MG TABS tablet Take 1 tablet (2.5 mg total) by mouth 2 (two) times daily. 180 tablet 3  . diltiazem (CARDIZEM) 60 MG tablet Take 60 mg by mouth 4 (four) times daily.     . furosemide (LASIX) 40 MG tablet TAKE 1 TABLET (40 MG TOTAL) BY MOUTH TWICE DAILY. 60 tablet 0  . ipratropium-albuterol (DUONEB) 0.5-2.5 (3) MG/3ML SOLN Take 3 mLs by nebulization 2 (two) times daily.    [name] levothyroxine (SYNTHROID, LEVOTHROID) 50 MCG tablet TAKE ONE TABLET BY MOUTH ONCE A DAY AS DIRECTED 90 tablet 1  . metoprolol tartrate (LOPRESSOR) 50 MG tablet TAKE 1 TABLET (50 MG TOTAL) BY MOUTH TWICE DAILY. 180 tablet 1  . omeprazole (PRILOSEC) 40 MG capsule Take 1 capsule (40 mg total) by mouth daily. 90 capsule 1  . potassium chloride (K-DUR) 10 MEQ tablet Take 1 tablet (10 mEq total) by mouth daily. 30 tablet 2  . Probiotic Product (PROBIOTIC-10) CHEW Chew 2  tablets by mouth daily.    [name] Respiratory Therapy Supplies (FLUTTER) DEVI Use as directed. 1 each 0  . vancomycin (VANCOCIN) 50 mg/mL oral solution Take 171m po QIDx7 days then 1258mBIDx7 days then 1252mailyx7 days then 125m38mery other day x 7days then 125mg8mays x7 days 140 mL 0   No facility-administered medications prior to visit.         Objective:   Physical Exam Vitals:   11/21/17 1333  BP: 122/62  Pulse: 74  SpO2: 90%  Weight: 107 lb (48.5 kg)   Gen: Pleasant, ill-appearing elderly woman in a wheelchair, in no distress,  normal affect  ENT: No lesions,  mouth clear,  oropharynx clear, no postnasal drip  Neck: No JVD, no Ryder  Lungs: No use of accessory muscles, distant bilaterally but no crackles, no wheezes  Cardiovascular: Irregular, no murmur, bilateral lower extremity edema more on the left than on the right  Musculoskeletal: No deformities, no cyanosis or clubbing  Neuro: alert, non focal  Skin: Warm, no lesions or rashes     Assessment & Plan:  Bronchiectasis (HCC) [place] states that she does not make any sputum.  Is not having any cough.  I think at this time I will hold off on any chest imaging.  We can repeat visit as we go forward.  Chronic respiratory failure with hypoxia (HCC) Continue oxygen at 2 L/min at all times.  We will perform an overnight oximetry to ensure that 2 L is adequate with sleep.  COPD with emphysema (HCC) [place] DuoNeb twice a day.  Consider increasing to 4 times daily if her symptoms evolve.  Congestive heart failure (HCC) [place] has diastolic CHF.  They have a good calendar that indicates that she has been gaining weight, approximately 8-9 pounds over the last week.  She also has increased lower extremity edema, pleural effusions.  I am going to temporarily increase her Lasix, then go back down to 40 mg twice daily.  She has follow-up planned with Dr. CrensStanford Breedhe near future.  Left hip pain Mechanical fall 2/24.  She  impacted her sacrum, left hip.  She is still having pain.  We will perform x-rays of her pelvis, left hip, left lower extremity.  Forward this information to Dr. McKeoJanell Quiet PhD 11/21/2017, 2:06 PM [facility] [phone] no answer [phone]

## 2017-11-21 NOTE — Patient Instructions (Addendum)
Please continue DuoNeb twice a day as you are taking it. Continue oxygen at 2 L/min at all times. We will perform an overnight oximetry on 2 L/min to ensure that this flow rate is adequate We will perform x-rays of your left hip, pelvis, left lower extremity.  We will forward these results to Dr Melford Aase Please increase her Lasix to 40 mg 3 times a day for the next 3 days.  Then go back to 40 mg twice a day. Follow with Dr Stanford Breed as planned.  Follow with Dr Lamonte Sakai in 4 months or sooner if you have any problems.

## 2017-11-22 NOTE — Addendum Note (Signed)
Addended by: Desmond Dike C on: 11/22/2017 10:38 AM   Modules accepted: Orders

## 2017-11-26 ENCOUNTER — Other Ambulatory Visit: Payer: Self-pay | Admitting: Internal Medicine

## 2017-12-13 DIAGNOSIS — J449 Chronic obstructive pulmonary disease, unspecified: Secondary | ICD-10-CM | POA: Diagnosis not present

## 2017-12-13 DIAGNOSIS — R0902 Hypoxemia: Secondary | ICD-10-CM | POA: Diagnosis not present

## 2017-12-14 DIAGNOSIS — S81801A Unspecified open wound, right lower leg, initial encounter: Secondary | ICD-10-CM | POA: Diagnosis not present

## 2017-12-14 DIAGNOSIS — J9611 Chronic respiratory failure with hypoxia: Secondary | ICD-10-CM | POA: Diagnosis not present

## 2017-12-20 NOTE — Progress Notes (Signed)
HPI: FU AI, PAF, palpitations and hypertension. Nuclear study in October 2009 revealed an ejection fraction of 73% and normal perfusion. CardioNet in February of 2014 showed sinus rhythm with PACs, PVCs, occasional junctional escape beats and pauses greater than 3 seconds. Monitor in Jan 2017 showed sinus rhythm with PACs and brief PAT.    Last echocardiogram November 2018 showed normal LV function, grade 2 diastolic dysfunction, mild aortic and mitral regurgitation, moderate tricuspid regurgitation, small pericardial effusion and right pleural effusion.  Has had episodes of paroxysmal atrial fibrillation.  Has also had pleural effusions requiring thoracentesis (transudate; cytology negative).  Since last seen,   Current Outpatient Medications  Medication Sig Dispense Refill  . acetaZOLAMIDE (DIAMOX) 250 MG tablet Take 1 tablet (250 mg total) by mouth daily. 90 tablet 1  . ALPRAZolam (XANAX) 0.25 MG tablet Take 0.125-0.25 mg by mouth at bedtime.     [name] apixaban (ELIQUIS) 2.5 MG TABS tablet Take 1 tablet (2.5 mg total) by mouth 2 (two) times daily. 180 tablet 3  . diltiazem (CARDIZEM) 60 MG tablet Take 60 mg by mouth 4 (four) times daily.     . furosemide (LASIX) 40 MG tablet TAKE ONE TABLET (40MG TOTAL) BY MOUTH TWICE DAILY 60 tablet 0  . ipratropium-albuterol (DUONEB) 0.5-2.5 (3) MG/3ML SOLN Take 3 mLs by nebulization 2 (two) times daily.    [name] levothyroxine (SYNTHROID, LEVOTHROID) 50 MCG tablet TAKE ONE TABLET BY MOUTH ONCE A DAY AS DIRECTED 90 tablet 1  . metoprolol tartrate (LOPRESSOR) 50 MG tablet TAKE 1 TABLET (50 MG TOTAL) BY MOUTH TWICE DAILY. 180 tablet 1  . omeprazole (PRILOSEC) 40 MG capsule Take 1 capsule (40 mg total) by mouth daily. 90 capsule 1  . potassium chloride (K-DUR) 10 MEQ tablet Take 1 tablet (10 mEq total) by mouth daily. 30 tablet 2  . Probiotic Product (PROBIOTIC-10) CHEW Chew 2 tablets by mouth daily.    [name] Respiratory Therapy Supplies (FLUTTER) DEVI Use as directed.  1 each 0   No current facility-administered medications for this visit.      Past Medical History:  Diagnosis Date  . Aortic regurgitation   . Bronchiectasis ([place])   . COPD (chronic obstructive pulmonary disease) ([place])   . DJD (degenerative joint disease)   . HLD (hyperlipidemia)   . HTN (hypertension)   . Hypothyroidism   . Palpitations    a. has known history of PAC's and PVC's. b. 09/2015: monitor showed sinus rhythm with occasional PAC's and a brief episode of PAT.  . S/P thoracentesis     Past Surgical History:  Procedure Laterality Date  . ABDOMINAL HYSTERECTOMY  1993  . CATARACT EXTRACTION, BILATERAL    . WOUND DEBRIDEMENT Right 06/29/2017   Procedure: RIGHT LOWER  LEG DEBRIDEMENT;  Surgeon: [name], MD;  Location: [facility];  Service: General;  Laterality: Right;    Social History   Socioeconomic History  . Marital status: Married    Spouse name: Not on file  . Number of children: 5  . Years of education: Not on file  . Highest education level: Not on file  Occupational History  . Occupation: retired  Scientific laboratory technician  . Financial resource strain: Not on file  . Food insecurity:    Worry: Not on file    Inability: Not on file  . Transportation needs:    Medical: Not on file    Non-medical: Not on file  Tobacco Use  . Smoking status: Former Smoker  Packs/day: 1.00    Years: 20.00    Pack years: 20.00    Types: Cigarettes    Last attempt to quit: 09/27/1974    Years since quitting: 43.2  . Smokeless tobacco: Never Used  Substance and Sexual Activity  . Alcohol use: No  . Drug use: No  . Sexual activity: Never  Lifestyle  . Physical activity:    Days per week: Not on file    Minutes per session: Not on file  . Stress: Not on file  Relationships  . Social connections:    Talks on phone: Not on file    Gets together: Not on file    Attends religious service: Not on file    Active member of club or organization: Not on file    Attends  meetings of clubs or organizations: Not on file    Relationship status: Not on file  . Intimate partner violence:    Fear of current or ex partner: Not on file    Emotionally abused: Not on file    Physically abused: Not on file    Forced sexual activity: Not on file  Other Topics Concern  . Not on file  Social History Narrative  . Not on file    Family History  Problem Relation Age of Onset  . Emphysema Mother   . Diabetes Mother   . Cancer Father   . Emphysema Sister   . Emphysema Sister   . Rheumatic fever Sister   . Heart attack Sister   . Diabetes Sister   . Diabetes Brother   . Asthma Brother        as a child    ROS: no fevers or chills, productive cough, hemoptysis, dysphasia, odynophagia, melena, hematochezia, dysuria, hematuria, rash, seizure activity, orthopnea, PND, pedal edema, claudication. Remaining systems are negative.  Physical Exam: Well-developed well-nourished in no acute distress.  Skin is warm and dry.  HEENT is normal.  Neck is supple.  Chest is clear to auscultation with normal expansion.  Cardiovascular exam is regular rate and rhythm.  Abdominal exam nontender or distended. No masses palpated. Extremities show no edema. neuro grossly intact  ECG- personally reviewed  A/P  1 Chronic diastolic CHF-Euvolemic on exam; continue present dose of diuretic; continue low NA diet and fluid restriction.   2 paroxysmal atrial fibrillation-patient remains in sinus rhythm.  Continue metoprolol and Cardizem for rate control if atrial fibrillation recurs.  Continue apixaban.  Check hemoglobin and renal function.  3 hypertension-blood pressure is controlled.  Continue present medications.  4 aortic insufficiency-mild on most recent echocardiogram.  Kirk Ruths, MD

## 2017-12-22 ENCOUNTER — Telehealth: Payer: Self-pay | Admitting: *Deleted

## 2017-12-22 NOTE — Telephone Encounter (Signed)
Patient called and states she has a slight cough, sore throat and a little short of breath.  Per Vicie Mutters, PA, she can try OTC Claritin and Delsym and use her nebulizer.  The patient will call back in the morning, if not feeling better.

## 2017-12-23 ENCOUNTER — Other Ambulatory Visit: Payer: Self-pay | Admitting: Internal Medicine

## 2017-12-23 ENCOUNTER — Telehealth: Payer: Self-pay | Admitting: Adult Health

## 2017-12-23 ENCOUNTER — Ambulatory Visit (HOSPITAL_COMMUNITY)
Admission: RE | Admit: 2017-12-23 | Discharge: 2017-12-23 | Disposition: A | Discharge status: Home / Self Care | Payer: Medicare Other | Source: Ambulatory Visit | Attending: Adult Health | Admitting: Adult Health

## 2017-12-23 ENCOUNTER — Other Ambulatory Visit: Payer: Self-pay | Admitting: Adult Health

## 2017-12-23 ENCOUNTER — Ambulatory Visit (INDEPENDENT_AMBULATORY_CARE_PROVIDER_SITE_OTHER): Payer: Medicare Other | Admitting: Adult Health

## 2017-12-23 ENCOUNTER — Encounter: Payer: Self-pay | Admitting: Adult Health

## 2017-12-23 VITALS — BP 120/66 | HR 89 | Temp 98.2°F | Ht 63.0 in | Wt 107.0 lb

## 2017-12-23 DIAGNOSIS — R05 Cough: Secondary | ICD-10-CM | POA: Diagnosis not present

## 2017-12-23 DIAGNOSIS — R0602 Shortness of breath: Secondary | ICD-10-CM | POA: Diagnosis not present

## 2017-12-23 DIAGNOSIS — J00 Acute nasopharyngitis [common cold]: Secondary | ICD-10-CM

## 2017-12-23 DIAGNOSIS — R0989 Other specified symptoms and signs involving the circulatory and respiratory systems: Secondary | ICD-10-CM

## 2017-12-23 DIAGNOSIS — J029 Acute pharyngitis, unspecified: Secondary | ICD-10-CM | POA: Diagnosis not present

## 2017-12-23 DIAGNOSIS — J90 Pleural effusion, not elsewhere classified: Secondary | ICD-10-CM | POA: Diagnosis not present

## 2017-12-23 DIAGNOSIS — J449 Chronic obstructive pulmonary disease, unspecified: Secondary | ICD-10-CM | POA: Insufficient documentation

## 2017-12-23 DIAGNOSIS — M7989 Other specified soft tissue disorders: Secondary | ICD-10-CM | POA: Insufficient documentation

## 2017-12-23 DIAGNOSIS — I10 Essential (primary) hypertension: Secondary | ICD-10-CM | POA: Diagnosis not present

## 2017-12-23 DIAGNOSIS — Z87891 Personal history of nicotine dependence: Secondary | ICD-10-CM | POA: Diagnosis not present

## 2017-12-23 LAB — CBC WITH DIFFERENTIAL/PLATELET
Basophils Absolute: 31 cells/uL (ref 0–200)
Basophils Relative: 0.4 %
EOS PCT: 2.3 %
Eosinophils Absolute: 179 cells/uL (ref 15–500)
HCT: 37.7 % (ref 35.0–45.0)
Hemoglobin: 12.2 g/dL (ref 11.7–15.5)
Lymphs Abs: 1513 cells/uL (ref 850–3900)
MCH: 29.4 pg (ref 27.0–33.0)
MCHC: 32.4 g/dL (ref 32.0–36.0)
MCV: 90.8 fL (ref 80.0–100.0)
MONOS PCT: 8 %
MPV: 11.4 fL (ref 7.5–12.5)
NEUTROS PCT: 69.9 %
Neutro Abs: 5452 cells/uL (ref 1500–7800)
PLATELETS: 220 10*3/uL (ref 140–400)
RBC: 4.15 10*6/uL (ref 3.80–5.10)
RDW: 13.4 % (ref 11.0–15.0)
TOTAL LYMPHOCYTE: 19.4 %
WBC mixed population: 624 cells/uL (ref 200–950)
WBC: 7.8 10*3/uL (ref 3.8–10.8)

## 2017-12-23 LAB — BASIC METABOLIC PANEL WITH GFR
BUN/Creatinine Ratio: 21 (calc) (ref 6–22)
BUN: 24 mg/dL (ref 7–25)
CHLORIDE: 98 mmol/L (ref 98–110)
CO2: 39 mmol/L — ABNORMAL HIGH (ref 20–32)
Calcium: 9.3 mg/dL (ref 8.6–10.4)
Creat: 1.15 mg/dL — ABNORMAL HIGH (ref 0.60–0.88)
GFR, Est African American: 50 mL/min/{1.73_m2} — ABNORMAL LOW (ref >=60)
GFR, Est Non African American: 43 mL/min/{1.73_m2} — ABNORMAL LOW (ref >=60)
GLUCOSE: 100 mg/dL — AB (ref 65–99)
POTASSIUM: 3.7 mmol/L (ref 3.5–5.3)
SODIUM: 141 mmol/L (ref 135–146)

## 2017-12-23 MED ORDER — PREDNISONE 20 MG PO TABS: ORAL_TABLET | ORAL | 0 refills | Status: DC

## 2017-12-23 MED ORDER — PROMETHAZINE-DM 6.25-15 MG/5ML PO SYRP: 5.0000 mL | ORAL_SOLUTION | Freq: Four times a day (QID) | ORAL | 1 refills | Status: DC | PRN

## 2017-12-23 MED ORDER — AZITHROMYCIN 250 MG PO TABS: ORAL_TABLET | ORAL | 1 refills | Status: AC

## 2017-12-23 NOTE — Progress Notes (Signed)
Assessment and Plan:  Andrea [name] was seen today for cough and sore throat.  Diagnoses and all orders for this visit:  Abnormal lung sounds STAT CXR ordered for abnormal lung sounds and unremarkable for acute changes, called to notify family and will proceed with outpatient management by antibiotics to prevent progression or complication. Strong ER precautions provided for worsening symptoms, emphasized adherence with respiratory medications and lasix.  -     CBC with Differential/Platelet -     BASIC METABOLIC PANEL WITH GFR -     DG Chest 2 View; Future  Acute nasopharyngitis -     azithromycin (ZITHROMAX) 250 MG tablet; Take 2 tablets (500 mg) on  Day 1,  followed by 1 tablet (250 mg) once daily on Days 2 through 5. -     predniSONE (DELTASONE) 20 MG tablet; 3 tablets daily with food for 3 days, 2 tabs daily for 3 days, 1 tab a day for 5 days. -     promethazine-dextromethorphan (PROMETHAZINE-DM) 6.25-15 MG/5ML syrup; Take 5 mLs by mouth 4 (four) times daily as needed for cough.  Go to the ER if any chest pain, shortness of breath, nausea, dizziness, severe HA, changes vision/speech  Further disposition pending results of labs. Discussed med's effects and SE's.   Over 15 minutes of exam, counseling, chart review, and critical decision making was performed.   Future Appointments  Date Time Provider [place]  01/02/2018 10:40 AM Lelon Perla, MD [facility]  02/15/2018 11:30 AM Unk Pinto, MD [facility] None  11/23/2018 10:00 AM Vicie Mutters, PA-C [facility] None    ------------------------------------------------------------------------------------------------------------------  HPI BP 120/66   Pulse 89   Temp 98.2 F (36.8 C)   Ht _0  (1.6 m)   Wt 107 lb (48.5 kg)   SpO2 (!) 84% Comment: w/ O2  BMI 18.95 kg/m   84 y.o. frail elderly female with CHF, pulmonary hypertension, COPD/emphysema, chronic respiratory failure with hypoxia on oxygen, and hx of  recurrent left pleural effusion presents for new scratchy throat and dry cough that started yesterday, with some left upper back pain that has shifted around under her arm. She denies chest pain/pressure, dyspnea, dizziness. She is on 2 L O2 today in office with O2 sat checked by provider demonstrating 88-90%. Per the patient this is baseline for her recently. She wears 1.5 L at home and increases to 2 L as needed. Family endorses she has slowly increasing fatigue over the past 2 weeks or so.    She is admittedly poorly compliant with medications and has not taken respiratory medications today. She is accompanied by her daughters who disagree on whether she is compliant with lasix - she is prescribed 80 mg daily, she does admit she has not taken this yet today. She presents with bilateral pitting edema.   She has a history of Systolic, denies orthopnea and paroxysmal nocturnal dyspnea. Positive for dyspnea, fatigue and lower extremity edema. Wt Readings from Last 3 Encounters:  12/23/17 107 lb (48.5 kg)  11/21/17 107 lb (48.5 kg)  11/16/17 108 lb 9.6 oz (49.3 kg)     Past Medical History:  Diagnosis Date  . Aortic regurgitation   . Bronchiectasis ([place])   . COPD (chronic obstructive pulmonary disease) ([place])   . DJD (degenerative joint disease)   . HLD (hyperlipidemia)   . HTN (hypertension)   . Hypothyroidism   . Palpitations    a. has known history of PAC's and PVC's. b. 09/2015: monitor showed sinus rhythm with  occasional PAC's and a brief episode of PAT.  . S/P thoracentesis      Allergies  Allergen Reactions  . Vancomycin     Worsening kidney function  . Sulfa Antibiotics Palpitations    Unknown    Current Outpatient Medications on File Prior to Visit  Medication Sig  . acetaZOLAMIDE (DIAMOX) 250 MG tablet Take 1 tablet (250 mg total) by mouth daily.  [name] ALPRAZolam (XANAX) 0.25 MG tablet Take 0.125-0.25 mg by mouth at bedtime.   [name] apixaban (ELIQUIS) 2.5 MG TABS tablet Take 1  tablet (2.5 mg total) by mouth 2 (two) times daily.  [name] diltiazem (CARDIZEM) 60 MG tablet Take 60 mg by mouth 4 (four) times daily.   . furosemide (LASIX) 40 MG tablet TAKE ONE TABLET (40MG TOTAL) BY MOUTH TWICE DAILY  . ipratropium-albuterol (DUONEB) 0.5-2.5 (3) MG/3ML SOLN Take 3 mLs by nebulization 2 (two) times daily.  [name] levothyroxine (SYNTHROID, LEVOTHROID) 50 MCG tablet TAKE ONE TABLET BY MOUTH ONCE A DAY AS DIRECTED  . metoprolol tartrate (LOPRESSOR) 50 MG tablet TAKE 1 TABLET (50 MG TOTAL) BY MOUTH TWICE DAILY.  [name] omeprazole (PRILOSEC) 40 MG capsule Take 1 capsule (40 mg total) by mouth daily.  . potassium chloride (K-DUR) 10 MEQ tablet Take 1 tablet (10 mEq total) by mouth daily.  . Probiotic Product (PROBIOTIC-10) CHEW Chew 2 tablets by mouth daily.  [name] Respiratory Therapy Supplies (FLUTTER) DEVI Use as directed.   No current facility-administered medications on file prior to visit.     ROS: all negative except above.   Physical Exam:  BP 120/66   Pulse 89   Temp 98.2 F (36.8 C)   Ht _0  (1.6 m)   Wt 107 lb (48.5 kg)   SpO2 (!) 84% Comment: w/ O2  BMI 18.95 kg/m   General Appearance: Frail elder on O2 by nasal cannula, in no acute distress. Eyes: PERRLA, EOMs, conjunctiva no swelling or erythema Sinuses: No Frontal/maxillary tenderness ENT/Mouth: Ext aud canals clear, TMs without erythema, bulging. Posterior pharynx mildly erythematous without swelling, or exudate on post pharynx.  Tonsils not swollen or erythematous. Hearing normal.  Neck: Supple, thyroid normal.  Respiratory: Respiratory effort increased, mildly tachypneic, BS clear on right, left somewhat diminished throughout with scattered rales,without rhonchi, wheezing or stridor.  Cardio: Irregularly irregular. Distal pulses symmetrical with 2+ pitting edema bilaterally.  Abdomen: Soft, + BS.  Non tender, no guarding, rebound, without palpable hernias, masses. Lymphatics: Non tender without lymphadenopathy.   Musculoskeletal: Slow gait.  Skin: Warm, dry without rashes, lesions. Psych: Awake and oriented X 3, normal affect, Insight and Judgment appropriate.     Izora Ribas, NP 11:10 AM [facility]

## 2017-12-23 NOTE — Telephone Encounter (Signed)
Patient's daughter called to notify of negative pleural effusion and pneumonia; treatment sent in to pharmacy and advised to present to ED if any worsening dyspnea over the weekend. Advised adherence to medication regimen, particularly respiratory medications and lasix, and to elevate extremities. Daughter expresses understanding.

## 2017-12-28 ENCOUNTER — Other Ambulatory Visit: Payer: Self-pay | Admitting: Physician Assistant

## 2017-12-30 ENCOUNTER — Other Ambulatory Visit: Payer: Self-pay | Admitting: Adult Health

## 2017-12-30 DIAGNOSIS — L80 Vitiligo: Secondary | ICD-10-CM | POA: Diagnosis not present

## 2017-12-30 DIAGNOSIS — Z85828 Personal history of other malignant neoplasm of skin: Secondary | ICD-10-CM | POA: Diagnosis not present

## 2017-12-30 DIAGNOSIS — J00 Acute nasopharyngitis [common cold]: Secondary | ICD-10-CM

## 2017-12-30 DIAGNOSIS — L814 Other melanin hyperpigmentation: Secondary | ICD-10-CM | POA: Diagnosis not present

## 2017-12-30 DIAGNOSIS — L821 Other seborrheic keratosis: Secondary | ICD-10-CM | POA: Diagnosis not present

## 2017-12-30 MED ORDER — PREDNISONE 20 MG PO TABS: ORAL_TABLET | ORAL | 0 refills | Status: AC

## 2017-12-30 MED ORDER — DOXYCYCLINE HYCLATE 100 MG PO TABS: 100.0000 mg | ORAL_TABLET | Freq: Two times a day (BID) | ORAL | 0 refills | Status: AC

## 2018-01-02 ENCOUNTER — Encounter: Payer: Self-pay | Admitting: Cardiology

## 2018-01-02 ENCOUNTER — Ambulatory Visit: Payer: Medicare Other | Admitting: Cardiology

## 2018-01-02 VITALS — BP 112/70 | HR 98 | Ht 63.0 in | Wt 102.6 lb

## 2018-01-02 DIAGNOSIS — I10 Essential (primary) hypertension: Secondary | ICD-10-CM | POA: Diagnosis not present

## 2018-01-02 DIAGNOSIS — I5032 Chronic diastolic (congestive) heart failure: Secondary | ICD-10-CM

## 2018-01-02 DIAGNOSIS — I482 Chronic atrial fibrillation: Secondary | ICD-10-CM

## 2018-01-02 DIAGNOSIS — I4821 Permanent atrial fibrillation: Secondary | ICD-10-CM

## 2018-01-02 NOTE — Progress Notes (Signed)
HPI: FU AI, PAF, palpitations and hypertension. Nuclear study in October 2009 revealed an ejection fraction of 73% and normal perfusion. CardioNet in February of 2014 showed sinus rhythm with PACs, PVCs, occasional junctional escape beats and pauses greater than 3 seconds. Monitor in Jan 2017 showed sinus rhythm with PACs and brief PAT.    Last echocardiogram November 2018 showed normal LV function, grade 2 diastolic dysfunction, mild aortic and mitral regurgitation, moderate tricuspid regurgitation, small pericardial effusion and right pleural effusion.   Has also had pleural effusions requiring thoracentesis (transudate; cytology negative).  Since last seen,  patient has some dyspnea on exertion but no orthopnea, PND, chest pain or bleeding.  Chronic pedal edema left greater than right.     Past Medical History:  Diagnosis Date  . Aortic regurgitation   . Bronchiectasis ([place])   . COPD (chronic obstructive pulmonary disease) ([place])   . DJD (degenerative joint disease)   . HLD (hyperlipidemia)   . HTN (hypertension)   . Hypothyroidism   . Palpitations    a. has known history of PAC's and PVC's. b. 09/2015: monitor showed sinus rhythm with occasional PAC's and a brief episode of PAT.  . S/P thoracentesis     Past Surgical History:  Procedure Laterality Date  . ABDOMINAL HYSTERECTOMY  1993  . CATARACT EXTRACTION, BILATERAL    . WOUND DEBRIDEMENT Right 06/29/2017   Procedure: RIGHT LOWER  LEG DEBRIDEMENT;  Surgeon: [name], MD;  Location: [facility];  Service: General;  Laterality: Right;    Social History   Socioeconomic History  . Marital status: Married    Spouse name: Not on file  . Number of children: 5  . Years of education: Not on file  . Highest education level: Not on file  Occupational History  . Occupation: retired  Scientific laboratory technician  . Financial resource strain: Not on file  . Food insecurity:    Worry: Not on file    Inability: Not on file  .  Transportation needs:    Medical: Not on file    Non-medical: Not on file  Tobacco Use  . Smoking status: Former Smoker    Packs/day: 1.00    Years: 20.00    Pack years: 20.00    Types: Cigarettes    Last attempt to quit: 09/27/1974    Years since quitting: 43.2  . Smokeless tobacco: Never Used  Substance and Sexual Activity  . Alcohol use: No  . Drug use: No  . Sexual activity: Never  Lifestyle  . Physical activity:    Days per week: Not on file    Minutes per session: Not on file  . Stress: Not on file  Relationships  . Social connections:    Talks on phone: Not on file    Gets together: Not on file    Attends religious service: Not on file    Active member of club or organization: Not on file    Attends meetings of clubs or organizations: Not on file    Relationship status: Not on file  . Intimate partner violence:    Fear of current or ex partner: Not on file    Emotionally abused: Not on file    Physically abused: Not on file    Forced sexual activity: Not on file  Other Topics Concern  . Not on file  Social History Narrative  . Not on file    Family History  Problem Relation Age of Onset  .  Emphysema Mother   . Diabetes Mother   . Cancer Father   . Emphysema Sister   . Emphysema Sister   . Rheumatic fever Sister   . Heart attack Sister   . Diabetes Sister   . Diabetes Brother   . Asthma Brother        as a child    ROS: no fevers or chills, productive cough, hemoptysis, dysphasia, odynophagia, melena, hematochezia, dysuria, hematuria, rash, seizure activity, orthopnea, PND, claudication. Remaining systems are negative.  Physical Exam: Well-developed frail in no acute distress.  Skin is warm and dry.  HEENT is normal.  Neck is supple.  Chest with diminished BS throughout Cardiovascular exam is irregular Abdominal exam nontender or distended. No masses palpated. Extremities show trace edema on left and 1 + edema on right. neuro grossly  intact  A/P  1 Chronic diastolic CHF-Euvolemic on exam; continue present dose of diuretic; continue low NA diet and fluid restriction.   2 permanent atrial fibrillation-patient remains in atrial fibrillation. Continue metoprolol and Cardizem for rate control if atrial fibrillation recurs.  Continue apixaban.    3 hypertension-blood pressure is controlled.  Continue present medications.  4 aortic insufficiency-mild on most recent echocardiogram.  Kirk Ruths, MD

## 2018-01-02 NOTE — Patient Instructions (Signed)
Your physician wants you to follow-up in: [place] will receive a reminder letter in the mail two months in advance. If you don't receive a letter, please call our office to schedule the follow-up appointment.   If you need a refill on your cardiac medications before your next appointment, please call your pharmacy.

## 2018-01-14 DIAGNOSIS — J9611 Chronic respiratory failure with hypoxia: Secondary | ICD-10-CM | POA: Diagnosis not present

## 2018-01-14 DIAGNOSIS — S81801A Unspecified open wound, right lower leg, initial encounter: Secondary | ICD-10-CM | POA: Diagnosis not present

## 2018-01-24 ENCOUNTER — Other Ambulatory Visit: Payer: Self-pay | Admitting: Internal Medicine

## 2018-01-24 DIAGNOSIS — R410 Disorientation, unspecified: Secondary | ICD-10-CM

## 2018-01-24 DIAGNOSIS — Z79899 Other long term (current) drug therapy: Secondary | ICD-10-CM

## 2018-01-24 DIAGNOSIS — I10 Essential (primary) hypertension: Secondary | ICD-10-CM

## 2018-01-24 DIAGNOSIS — R300 Dysuria: Secondary | ICD-10-CM

## 2018-01-25 ENCOUNTER — Ambulatory Visit (INDEPENDENT_AMBULATORY_CARE_PROVIDER_SITE_OTHER): Payer: Medicare Other | Admitting: Adult Health

## 2018-01-25 ENCOUNTER — Ambulatory Visit: Payer: Self-pay

## 2018-01-25 ENCOUNTER — Encounter: Payer: Self-pay | Admitting: Adult Health

## 2018-01-25 VITALS — BP 108/60 | HR 82 | Temp 98.1°F | Ht 63.0 in | Wt 98.0 lb

## 2018-01-25 DIAGNOSIS — J9611 Chronic respiratory failure with hypoxia: Secondary | ICD-10-CM

## 2018-01-25 DIAGNOSIS — R410 Disorientation, unspecified: Secondary | ICD-10-CM

## 2018-01-25 DIAGNOSIS — R300 Dysuria: Secondary | ICD-10-CM | POA: Diagnosis not present

## 2018-01-25 DIAGNOSIS — H6122 Impacted cerumen, left ear: Secondary | ICD-10-CM | POA: Diagnosis not present

## 2018-01-25 DIAGNOSIS — I10 Essential (primary) hypertension: Secondary | ICD-10-CM | POA: Diagnosis not present

## 2018-01-25 DIAGNOSIS — Z79899 Other long term (current) drug therapy: Secondary | ICD-10-CM

## 2018-01-25 DIAGNOSIS — I5032 Chronic diastolic (congestive) heart failure: Secondary | ICD-10-CM

## 2018-01-25 LAB — CBC WITH DIFFERENTIAL/PLATELET
BASOS ABS: 20 {cells}/uL (ref 0–200)
Basophils Relative: 0.2 %
EOS ABS: 88 {cells}/uL (ref 15–500)
Eosinophils Relative: 0.9 %
HEMATOCRIT: 41.1 % (ref 35.0–45.0)
Hemoglobin: 13.7 g/dL (ref 11.7–15.5)
LYMPHS ABS: 1588 {cells}/uL (ref 850–3900)
MCH: 30.2 pg (ref 27.0–33.0)
MCHC: 33.3 g/dL (ref 32.0–36.0)
MCV: 90.5 fL (ref 80.0–100.0)
MPV: 12.2 fL (ref 7.5–12.5)
Monocytes Relative: 6.4 %
NEUTROS PCT: 76.3 %
Neutro Abs: 7477 cells/uL (ref 1500–7800)
Platelets: 210 10*3/uL (ref 140–400)
RBC: 4.54 10*6/uL (ref 3.80–5.10)
RDW: 13 % (ref 11.0–15.0)
Total Lymphocyte: 16.2 %
WBC: 9.8 10*3/uL (ref 3.8–10.8)
WBCMIX: 627 {cells}/uL (ref 200–950)

## 2018-01-25 NOTE — Progress Notes (Signed)
Assessment and Plan:  Confusion Resolved, at baseline today, will screen for UTI and treat as indicated -     Urine Culture -     Urinalysis, Routine w reflex microscopic -     COMPLETE METABOLIC PANEL WITH GFR -     CBC with Differential/Platelet  Chronic diastolic congestive heart failure (HCC) Weights and edema significantly improved from previous Emphasized adherence to lasix and appropriate hydration Check GFR/renal function today  Chronic respiratory failure with hypoxia (HCC) O2 sats stable 88-95% today off of O2, she denies cough, sore throat resolved Will have monitor closely, if increasing dyspnea or cough, to notify immediately and will initiate prednisone and abx. Discussed will treat if indicated but will avoid unnecessary abx.   Left cerumen impaction - stop using Qtips, irrigation used in the office without complications, use OTC drops/oil at home to prevent reoccurence  Further disposition pending results of labs. Discussed med's effects and SE's.   Over 30 minutes of exam, counseling, chart review, and critical decision making was performed.   Future Appointments  Date Time Provider [place]  02/15/2018 11:30 AM Unk Pinto, MD [facility] None  11/23/2018 10:00 AM Vicie Mutters, PA-C [facility] None    ------------------------------------------------------------------------------------------------------------------   HPI BP 108/60   Pulse 82   Temp 98.1 F (36.7 C)   Ht 5' 3" (1.6 m)   Wt 98 lb (44.5 kg)   SpO2 95%   BMI 17.36 kg/m   84 y.o.female with CHF, COPD/emphysema with chronic respiratory failure with hypoxia and recent recurrent hospitalizations due to poor adherence to medication regimen (lasix) presents for evaluation accompanied by her two daughters who were concerned about some ? Mild confusion last week, and wanted to evaluate for UTI, though confusion appears to have resolved. Today she presents A& O x3 and at baseline. She  also presents reporting she had a sore throat the last 2 days but is resolved today, though she reports she spit up yellow phlegm this AM. She denies any coughing or increased dyspnea; She is currently prescribed home O2, wears 1-2 L PRN, she demonstrates fluctuating O2 sats on RA ranging from 88-95% during our visit today. She speaks in complete sentences and does not appear in distress. She reports she feels well.   She has a history of grade 2 Diastolic, denies orthopnea and paroxysmal nocturnal dyspnea. Positive for dyspnea and LE edema. However LE edema is significantly improved from previous and felt to be the best observed over the last 6 months by this provider. She reports she has generally been adherent to recommendations to take 40 mg lasix BID recently.  Wt Readings from Last 3 Encounters:  01/25/18 98 lb (44.5 kg)  01/02/18 102 lb 9.6 oz (46.5 kg)  12/23/17 107 lb (48.5 kg)    Past Medical History:  Diagnosis Date  . Aortic regurgitation   . Bronchiectasis ([place])   . COPD (chronic obstructive pulmonary disease) ([place])   . DJD (degenerative joint disease)   . HLD (hyperlipidemia)   . HTN (hypertension)   . Hypothyroidism   . Palpitations    a. has known history of PAC's and PVC's. b. 09/2015: monitor showed sinus rhythm with occasional PAC's and a brief episode of PAT.  . S/P thoracentesis      Allergies  Allergen Reactions  . Vancomycin     Worsening kidney function  . Sulfa Antibiotics Palpitations    Unknown    Current Outpatient Medications on File Prior to Visit  Medication Sig  .  acetaZOLAMIDE (DIAMOX) 250 MG tablet Take 1 tablet (250 mg total) by mouth daily.  [name] ALPRAZolam (XANAX) 0.25 MG tablet Take 0.125-0.25 mg by mouth at bedtime.   [name] apixaban (ELIQUIS) 2.5 MG TABS tablet Take 1 tablet (2.5 mg total) by mouth 2 (two) times daily.  [name] diltiazem (CARDIZEM) 60 MG tablet Take 60 mg by mouth 4 (four) times daily.   . furosemide (LASIX) 40 MG tablet TAKE ONE TABLET  (40MG TOTAL) BY MOUTH TWICE DAILY  . ipratropium-albuterol (DUONEB) 0.5-2.5 (3) MG/3ML SOLN Take 3 mLs by nebulization 2 (two) times daily.  [name] levothyroxine (SYNTHROID, LEVOTHROID) 50 MCG tablet TAKE ONE TABLET BY MOUTH ONCE A DAY AS DIRECTED  . metoprolol tartrate (LOPRESSOR) 50 MG tablet TAKE 1 TABLET (50 MG TOTAL) BY MOUTH TWICE DAILY.  [name] omeprazole (PRILOSEC) 40 MG capsule Take 1 capsule (40 mg total) by mouth daily.  . potassium chloride (K-DUR) 10 MEQ tablet Take 1 tablet (10 mEq total) by mouth daily.  . Probiotic Product (PROBIOTIC-10) CHEW Chew 2 tablets by mouth daily.  . promethazine-dextromethorphan (PROMETHAZINE-DM) 6.25-15 MG/5ML syrup Take 5 mLs by mouth 4 (four) times daily as needed for cough.  [name] Respiratory Therapy Supplies (FLUTTER) DEVI Use as directed.   No current facility-administered medications on file prior to visit.     ROS: Review of Systems  Constitutional: Positive for malaise/fatigue. Negative for chills, diaphoresis and fever.  HENT: Positive for sore throat. Negative for congestion.   Respiratory: Negative for cough, sputum production, shortness of breath and wheezing.   Cardiovascular: Positive for leg swelling. Negative for chest pain, palpitations, orthopnea and PND.  Gastrointestinal: Negative for nausea and vomiting.  Genitourinary: Negative for dysuria, flank pain, frequency, hematuria and urgency.  Musculoskeletal: Negative for falls.  Skin: Negative for rash.    Physical Exam:  BP 108/60   Pulse 82   Temp 98.1 F (36.7 C)   Ht 5' 3" (1.6 m)   Wt 98 lb (44.5 kg)   SpO2 95%   BMI 17.36 kg/m   General Appearance: Frail elder in wheelchair, in no acute distress. Eyes: PERRLA, EOMs, conjunctiva no swelling or erythema Sinuses: No Frontal/maxillary tenderness ENT/Mouth: Right external aud canals clear, left obstructed by dry wax, R TM without erythema, bulging. No erythema, swelling, or exudate on post pharynx.  Tonsils not swollen or  erythematous. Hearing normal.  Neck: Supple.  Respiratory: Respiratory effort normal, chest expansion limited, BS very quiet throughout without audible crackles/rhonchi/wheezing.  Cardio: RRR with no MRGs. Symmetrical distal pulses, LLE with 2+ edema, RLE 1+ edema/minimal Abdomen: Soft, + BS.  Non tender, no guarding, rebound, palpable hernias, masses. Lymphatics: Non tender without lymphadenopathy.  Musculoskeletal: No obvious deformity, in wheelchair Skin: Warm, dry without notable rashes or lesions Psych: Awake and oriented X 3, normal affect, Insight and Judgment fair/questionable.     Izora Ribas, NP 3:41 PM [facility]

## 2018-01-26 LAB — COMPLETE METABOLIC PANEL WITH GFR
AG Ratio: 1.2 (calc) (ref 1.0–2.5)
ALT: 9 U/L (ref 6–29)
AST: 15 U/L (ref 10–35)
Albumin: 3.9 g/dL (ref 3.6–5.1)
Alkaline phosphatase (APISO): 92 U/L (ref 33–130)
BUN/Creatinine Ratio: 21 (calc) (ref 6–22)
BUN: 23 mg/dL (ref 7–25)
CALCIUM: 9.2 mg/dL (ref 8.6–10.4)
CO2: 34 mmol/L — AB (ref 20–32)
CREATININE: 1.12 mg/dL — AB (ref 0.60–0.88)
Chloride: 100 mmol/L (ref 98–110)
GFR, EST AFRICAN AMERICAN: 52 mL/min/{1.73_m2} — AB (ref >=60)
GFR, EST NON AFRICAN AMERICAN: 44 mL/min/{1.73_m2} — AB (ref >=60)
Globulin: 3.2 g/dL (calc) (ref 1.9–3.7)
Glucose, Bld: 106 mg/dL — ABNORMAL HIGH (ref 65–99)
Potassium: 3.9 mmol/L (ref 3.5–5.3)
Sodium: 140 mmol/L (ref 135–146)
TOTAL PROTEIN: 7.1 g/dL (ref 6.1–8.1)
Total Bilirubin: 0.5 mg/dL (ref 0.2–1.2)

## 2018-01-26 LAB — URINALYSIS, ROUTINE W REFLEX MICROSCOPIC
Bilirubin Urine: NEGATIVE
Glucose, UA: NEGATIVE
HGB URINE DIPSTICK: NEGATIVE
Ketones, ur: NEGATIVE
Nitrite: NEGATIVE
RBC / HPF: NONE SEEN /HPF (ref 0–2)
Specific Gravity, Urine: 1.016 (ref 1.001–1.03)

## 2018-01-26 LAB — URINE CULTURE
MICRO NUMBER: 90532825
RESULT: NO GROWTH
SPECIMEN QUALITY: ADEQUATE

## 2018-01-27 ENCOUNTER — Other Ambulatory Visit: Payer: Self-pay | Admitting: Adult Health

## 2018-01-27 ENCOUNTER — Telehealth: Payer: Self-pay | Admitting: Emergency Medicine

## 2018-01-27 DIAGNOSIS — J9611 Chronic respiratory failure with hypoxia: Secondary | ICD-10-CM

## 2018-01-27 MED ORDER — CEPHALEXIN 250 MG PO CAPS: 250.0000 mg | ORAL_CAPSULE | Freq: Three times a day (TID) | ORAL | 0 refills | Status: AC

## 2018-01-27 NOTE — Telephone Encounter (Signed)
Please let the patient know that her oximetry showed that he saturations dropped for part of the night on 2L/min oxygen. I believe she needs to increase to 3L/min while sleeping. Thanks.

## 2018-01-27 NOTE — Telephone Encounter (Signed)
Called and spoke with pt's daughter [name] stating that per pt's ONO, pt needs to wear 3L O2 at night.  Lori expressed understanding. Nothing further needed at this time.

## 2018-01-27 NOTE — Telephone Encounter (Signed)
Spoke with patient's daughter. She stated that the patient did have an ONO but they have not heard anything back in regards in results from the March test. She spoke with [facility] and they stated that they have faxed over the results. I do not see them.   Spoke with [place] at [facility]. Will fax over results. Once results have been received, will place in RB's look-at folder.

## 2018-01-27 NOTE — Telephone Encounter (Signed)
Received ONO and will place in [place] folder.

## 2018-02-08 ENCOUNTER — Other Ambulatory Visit: Payer: Self-pay | Admitting: Physician Assistant

## 2018-02-13 DIAGNOSIS — J9611 Chronic respiratory failure with hypoxia: Secondary | ICD-10-CM | POA: Diagnosis not present

## 2018-02-13 DIAGNOSIS — S81801A Unspecified open wound, right lower leg, initial encounter: Secondary | ICD-10-CM | POA: Diagnosis not present

## 2018-02-14 NOTE — Progress Notes (Signed)
This very nice 84 y.o. WWF presents for 3 month follow up with HTN, ASHD, COPD,  Hypothyroidism, HLD, Pre-Diabetes and Vitamin D Deficiency. She also has GERD controlled on her meds. Patient was recently tx'd for a UTI and is still having occasional dysuria.     Patient has COPD & is on nocturnal O2, is on Diamox for CO2 retention - followed by Dr Keturah Barre.  She also has a portable O2 concentrator. Resting O2 sat's usu range in the low 90's % , but with activity as walking about 40-50 feet, she quickly de-sat's to the low to mid 80's%.      Patient is treated for HTN (1990's) & BP has been controlled at home. Today's BP is at goal -  110/68. Patient is also followed by Dr Stanford Breed for AoI, cAfib and chronic diastolic CHF. Patient has had no complaints of any cardiac type chest pain, palpitations, dyspnea / orthopnea / PND, dizziness, claudication, or dependent edema.     Hyperlipidemia is controlled with diet & meds. Patient denies myalgias or other med SE's. Last Lipids were  Lab Results  Component Value Date   CHOL 223 (H) 02/15/2018   HDL 67 02/15/2018   LDLCALC 133 (H) 02/15/2018   TRIG 120 02/15/2018   CHOLHDL 3.3 02/15/2018      Also, the patient has history of PreDiabetes (2012) and has had no symptoms of reactive hypoglycemia, diabetic polys, paresthesias or visual blurring.  Last A1c was  Lab Results  Component Value Date   HGBA1C 5.4 01/24/2017      Patient has been on Thyroid Replacement since 2014.      Further, the patient also has history of Vitamin D Deficiency and supplements vitamin D without any suspected side-effects. Last vitamin D was near goal (70-100):   Lab Results  Component Value Date   VD25OH 52 01/24/2017   Current Outpatient Medications on File Prior to Visit  Medication Sig  . acetaZOLAMIDE (DIAMOX) 250 MG tablet Take 1 tablet (250 mg total) by mouth daily.  [name] ALPRAZolam (XANAX) 0.25 MG tablet Take 0.125-0.25 mg by mouth at bedtime.   [name] apixaban  (ELIQUIS) 2.5 MG TABS tablet Take 1 tablet (2.5 mg total) by mouth 2 (two) times daily.  [name] diltiazem (CARDIZEM) 60 MG tablet Take 60 mg by mouth 4 (four) times daily.   . furosemide (LASIX) 40 MG tablet TAKE ONE TABLET (40MG TOTAL) BY MOUTH TWICE DAILY  . levothyroxine (SYNTHROID, LEVOTHROID) 50 MCG tablet TAKE ONE TABLET BY MOUTH ONCE A DAY AS DIRECTED  . metoprolol tartrate (LOPRESSOR) 50 MG tablet TAKE 1 TABLET (50 MG TOTAL) BY MOUTH TWICE DAILY.  [name] omeprazole (PRILOSEC) 40 MG capsule Take 1 capsule (40 mg total) by mouth daily.  . potassium chloride (K-DUR) 10 MEQ tablet Take 1 tablet (10 mEq total) by mouth daily.  . Probiotic Product (PROBIOTIC-10) CHEW Chew 2 tablets by mouth daily.  [name] Respiratory Therapy Supplies (FLUTTER) DEVI Use as directed.   No current facility-administered medications on file prior to visit.    Allergies  Allergen Reactions  . Vancomycin     Worsening kidney function  . Sulfa Antibiotics Palpitations    Unknown   PMHx:   Past Medical History:  Diagnosis Date  . Aortic regurgitation   . Bronchiectasis ([place])   . COPD (chronic obstructive pulmonary disease) ([place])   . DJD (degenerative joint disease)   . HLD (hyperlipidemia)   . HTN (hypertension)   .  Hypothyroidism   . Palpitations    a. has known history of PAC's and PVC's. b. 09/2015: monitor showed sinus rhythm with occasional PAC's and a brief episode of PAT.  . S/P thoracentesis    Immunization History  Administered Date(s) Administered  . Influenza Split 07/16/2013  . Influenza, High Dose Seasonal PF 06/25/2015, 06/10/2016, 06/30/2017  . Influenza,inj,Quad PF,6+ Mos 07/04/2014  . Pneumococcal Conjugate-13 02/26/2014  . Pneumococcal Polysaccharide-23 10/04/2016  . Td 06/26/2013  . Tdap 06/19/2017   Past Surgical History:  Procedure Laterality Date  . ABDOMINAL HYSTERECTOMY  1993  . CATARACT EXTRACTION, BILATERAL    . WOUND DEBRIDEMENT Right 06/29/2017   Procedure: RIGHT LOWER  LEG  DEBRIDEMENT;  Surgeon: [name], MD;  Location: [facility];  Service: General;  Laterality: Right;   FHx:    Reviewed / unchanged  SHx:    Reviewed / unchanged   Systems Review:  Constitutional: Denies fever, chills, wt changes, headaches, insomnia, fatigue, night sweats, change in appetite. Eyes: Denies redness, blurred vision, diplopia, discharge, itchy, watery eyes.  ENT: Denies discharge, congestion, post nasal drip, epistaxis, sore throat, earache, hearing loss, dental pain, tinnitus, vertigo, sinus pain, snoring.  CV: Denies chest pain, palpitations, irregular heartbeat, syncope, dyspnea, diaphoresis, orthopnea, PND, claudication or edema. Respiratory: denies cough, dyspnea, DOE, pleurisy, hoarseness, laryngitis, wheezing.  Gastrointestinal: Denies dysphagia, odynophagia, heartburn, reflux, water brash, abdominal pain or cramps, nausea, vomiting, bloating, diarrhea, constipation, hematemesis, melena, hematochezia  or hemorrhoids. Genitourinary: Denies dysuria, frequency, urgency, nocturia, hesitancy, discharge, hematuria or flank pain. Musculoskeletal: Denies arthralgias, myalgias, stiffness, jt. swelling, pain, limping or strain/sprain.  Skin: Denies pruritus, rash, hives, warts, acne, eczema or change in skin lesion(s). Neuro: No weakness, tremor, incoordination, spasms, paresthesia or pain. Psychiatric: Denies confusion, memory loss or sensory loss. Endo: Denies change in weight, skin or hair change.  Heme/Lymph: No excessive bleeding, bruising or enlarged lymph nodes.  Physical Exam  BP 110/68   Pulse 60   Temp 97.7 F (36.5 C)   Resp 16   Ht _0  (1.6 m)   Wt 101 lb 6.4 oz (46 kg)   SpO2 93%   BMI 17.96 kg/m  O2 sat 93% at rest.  Appears  well nourished, well groomed  and in no distress.  Eyes: PERRLA, EOMs, conjunctiva no swelling or erythema. Sinuses: No frontal/maxillary tenderness ENT/Mouth: EAC's clear, TM's nl w/o erythema, bulging. Nares clear w/o  erythema, swelling, exudates. Oropharynx clear without erythema or exudates. Oral hygiene is good. Tongue normal, non obstructing. Hearing intact.  Neck: Supple. Thyroid not palpable. Car 2+/2+ without bruits, nodes or JVD. Chest: Respirations nl with BS clear & equal w/o rales, rhonchi, wheezing or stridor.  Cor: Heart sounds normal w/ regular rate and rhythm without sig. murmurs, gallops, clicks or rubs. Peripheral pulses normal and equal  without edema.  Abdomen: Soft & bowel sounds normal. Non-tender w/o guarding, rebound, hernias, masses or organomegaly.  Lymphatics: Unremarkable.  Musculoskeletal: Full ROM all peripheral extremities, joint stability, 5/5 strength and normal gait.  Skin: Warm, dry without exposed rashes, lesions or ecchymosis apparent.  Neuro: Cranial nerves intact, reflexes equal bilaterally. Sensory-motor testing grossly intact. Tendon reflexes grossly intact.  Pysch: Alert & oriented x 3.  Insight and judgement nl & appropriate. No ideations.  Assessment and Plan:  1. Essential hypertension  - Continue medication, monitor blood pressure at home.  - Continue DASH diet.  Reminder to go to the ER if any CP,  SOB, nausea, dizziness, severe HA, changes vision/speech.  -  CBC with Differential/Platelet - COMPLETE METABOLIC PANEL WITH GFR - Magnesium - TSH  2. Hyperlipidemia, mixed  - Continue diet/meds, exercise,& lifestyle modifications.  - Continue monitor periodic cholesterol/liver & renal functions   - TSH  3. Abnormal glucose  - Continue diet, exercise, lifestyle modifications.  - Monitor appropriate labs.  - Hemoglobin A1c - Insulin, random  4. Vitamin D deficiency  - Continue supplementation.   - VITAMIN D 25 Hydroxyl  5. Prediabetes  - Hemoglobin A1c - Insulin, random  6. AF (paroxysmal atrial fibrillation) (HCC)  - Hemoglobin A1c  7. Hypothyroidism  - TSH  8. Urinary tract infection   - Urinalysis, Routine w reflex microscopic -  Urine Culture  9. Chronic respiratory failure with hypoxia (HCC)  - ipratropium-albuterol (DUONEB) 0.5-2.5 (3) MG/3ML SOLN; Take 3 mLs by nebulization QID.  Dispense: 360 mL; Refill: 11  10. Medication management  - CBC with Differential/Platelet - COMPLETE METABOLIC PANEL WITH GFR - Magnesium - Lipid panel - TSH - Hemoglobin A1c - Insulin, random - VITAMIN D 25 Hydroxyl      Discussed  regular exercise, BP monitoring, weight control to achieve/maintain BMI less than 25 and discussed med and SE's. Recommended labs to assess and monitor clinical status with further disposition pending results of labs. Over 30 minutes of exam, counseling, chart review was performed.\

## 2018-02-14 NOTE — Patient Instructions (Signed)
Recommend Adult Low Dose Aspirin or  coated  Aspirin 81 mg daily  To reduce risk of Colon Cancer 20 %,  Skin Cancer 26 % ,  Melanoma 46%  and  Pancreatic cancer 60% +++++++++++++++++++++++++ Vitamin D goal  is between 70-100.  Please make sure that you are taking your Vitamin D as directed.  It is very important as a natural anti-inflammatory  helping hair, skin, and nails, as well as reducing stroke and heart attack risk.  It helps your bones and helps with mood. It also decreases numerous cancer risks so please take it as directed.  Low Vit D is associated with a 200-300% higher risk for CANCER  and 200-300% higher risk for HEART   ATTACK  &  STROKE.   ...................................... It is also associated with higher death rate at younger ages,  autoimmune diseases like Rheumatoid arthritis, Lupus, Multiple Sclerosis.    Also many other serious conditions, like depression, Alzheimer's Dementia, infertility, muscle aches, fatigue, fibromyalgia - just to name a few. ++++++++++++++++++++ Recommend the book "The END of DIETING" by Dr Joel Fuhrman  & the book "The END of DIABETES " by Dr Joel Fuhrman At [employer] - get book & Audio CD's    Being diabetic has a  300% increased risk for heart attack, stroke, cancer, and alzheimer- type vascular dementia. It is very important that you work harder with diet by avoiding all foods that are white. Avoid white rice (brown & wild rice is OK), white potatoes (sweetpotatoes in moderation is OK), White bread or wheat bread or anything made out of white flour like bagels, donuts, rolls, buns, biscuits, cakes, pastries, cookies, pizza crust, and pasta (made from white flour & egg whites) - vegetarian pasta or spinach or wheat pasta is OK. Multigrain breads like Arnold's or Pepperidge Farm, or multigrain sandwich thins or flatbreads.  Diet, exercise and weight loss can reverse and cure diabetes in the early stages.  Diet, exercise and weight loss is  very important in the control and prevention of complications of diabetes which affects every system in your body, ie. Brain - dementia/stroke, eyes - glaucoma/blindness, heart - heart attack/heart failure, kidneys - dialysis, stomach - gastric paralysis, intestines - malabsorption, nerves - severe painful neuritis, circulation - gangrene & loss of a leg(s), and finally cancer and Alzheimers.    I recommend avoid fried & greasy foods,  sweets/candy, white rice (brown or wild rice or Quinoa is OK), white potatoes (sweet potatoes are OK) - anything made from white flour - bagels, doughnuts, rolls, buns, biscuits,white and wheat breads, pizza crust and traditional pasta made of white flour & egg white(vegetarian pasta or spinach or wheat pasta is OK).  Multi-grain bread is OK - like multi-grain flat bread or sandwich thins. Avoid alcohol in excess. Exercise is also important.    Eat all the vegetables you want - avoid meat, especially red meat and dairy - especially cheese.  Cheese is the most concentrated form of trans-fats which is the worst thing to clog up our arteries. Veggie cheese is OK which can be found in the fresh produce section at [employer] or [employer] or [employer]  +++++++++++++++++++++ DASH Eating Plan  DASH stands for "Dietary Approaches to Stop Hypertension."   The DASH eating plan is a healthy eating plan that has been shown to reduce high blood pressure (hypertension). Additional health benefits may include reducing the risk of type 2 diabetes mellitus, heart disease, and stroke. The DASH eating plan may also   help with weight loss. WHAT DO I NEED TO KNOW ABOUT THE DASH EATING PLAN? For the DASH eating plan, you will follow these general guidelines:  Choose foods with a percent daily value for sodium of less than 5% (as listed on the food label).  Use salt-free seasonings or herbs instead of table salt or sea salt.  Check with your health care provider or pharmacist before  using salt substitutes.  Eat lower-sodium products, often labeled as "lower sodium" or "no salt added."  Eat fresh foods.  Eat more vegetables, fruits, and low-fat dairy products.  Choose whole grains. Look for the word "whole" as the first word in the ingredient list.  Choose fish   Limit sweets, desserts, sugars, and sugary drinks.  Choose heart-healthy fats.  Eat veggie cheese   Eat more home-cooked food and less restaurant, buffet, and fast food.  Limit fried foods.  Cook foods using methods other than frying.  Limit canned vegetables. If you do use them, rinse them well to decrease the sodium.  When eating at a restaurant, ask that your food be prepared with less salt, or no salt if possible.                      WHAT FOODS CAN I EAT? Read Dr Fara Olden Fuhrman's books on The End of Dieting & The End of Diabetes  Grains Whole grain or whole wheat bread. Brown rice. Whole grain or whole wheat pasta. Quinoa, bulgur, and whole grain cereals. Low-sodium cereals. Corn or whole wheat flour tortillas. Whole grain cornbread. Whole grain crackers. Low-sodium crackers.  Vegetables Fresh or frozen vegetables (raw, steamed, roasted, or grilled). Low-sodium or reduced-sodium tomato and vegetable juices. Low-sodium or reduced-sodium tomato sauce and paste. Low-sodium or reduced-sodium canned vegetables.   Fruits All fresh, canned (in natural juice), or frozen fruits.  Protein Products  All fish and seafood.  Dried beans, peas, or lentils. Unsalted nuts and seeds. Unsalted canned beans.  Dairy Low-fat dairy products, such as skim or 1% milk, 2% or reduced-fat cheeses, low-fat ricotta or cottage cheese, or plain low-fat yogurt. Low-sodium or reduced-sodium cheeses.  Fats and Oils Tub margarines without trans fats. Light or reduced-fat mayonnaise and salad dressings (reduced sodium). Avocado. Safflower, olive, or canola oils. Natural peanut or almond butter.  Other Unsalted popcorn  and pretzels. The items listed above may not be a complete list of recommended foods or beverages. Contact your dietitian for more options.  +++++++++++++++  WHAT FOODS ARE NOT RECOMMENDED? Grains/ White flour or wheat flour White bread. White pasta. White rice. Refined cornbread. Bagels and croissants. Crackers that contain trans fat.  Vegetables  Creamed or fried vegetables. Vegetables in a . Regular canned vegetables. Regular canned tomato sauce and paste. Regular tomato and vegetable juices.  Fruits Dried fruits. Canned fruit in light or heavy syrup. Fruit juice.  Meat and Other Protein Products Meat in general - RED meat & White meat.  Fatty cuts of meat. Ribs, chicken wings, all processed meats as bacon, sausage, bologna, salami, fatback, hot dogs, bratwurst and packaged luncheon meats.  Dairy Whole or 2% milk, cream, half-and-half, and cream cheese. Whole-fat or sweetened yogurt. Full-fat cheeses or blue cheese. Non-dairy creamers and whipped toppings. Processed cheese, cheese spreads, or cheese curds.  Condiments Onion and garlic salt, seasoned salt, table salt, and sea salt. Canned and packaged gravies. Worcestershire sauce. Tartar sauce. Barbecue sauce. Teriyaki sauce. Soy sauce, including reduced sodium. Steak sauce. Fish sauce. Oyster sauce. Cocktail  sauce. Horseradish. Ketchup and mustard. Meat flavorings and tenderizers. Bouillon cubes. Hot sauce. Tabasco sauce. Marinades. Taco seasonings. Relishes.  Fats and Oils Butter, stick margarine, lard, shortening and bacon fat. Coconut, palm kernel, or palm oils. Regular salad dressings.  Pickles and olives. Salted popcorn and pretzels.  The items listed above may not be a complete list of foods and beverages to avoid.

## 2018-02-15 ENCOUNTER — Ambulatory Visit (INDEPENDENT_AMBULATORY_CARE_PROVIDER_SITE_OTHER): Payer: Medicare Other | Admitting: Internal Medicine

## 2018-02-15 ENCOUNTER — Encounter: Payer: Self-pay | Admitting: Internal Medicine

## 2018-02-15 VITALS — BP 110/68 | HR 60 | Temp 97.7°F | Resp 16 | Ht 63.0 in | Wt 101.4 lb

## 2018-02-15 DIAGNOSIS — I480 Paroxysmal atrial fibrillation: Secondary | ICD-10-CM | POA: Diagnosis not present

## 2018-02-15 DIAGNOSIS — E782 Mixed hyperlipidemia: Secondary | ICD-10-CM | POA: Diagnosis not present

## 2018-02-15 DIAGNOSIS — N390 Urinary tract infection, site not specified: Secondary | ICD-10-CM

## 2018-02-15 DIAGNOSIS — R7303 Prediabetes: Secondary | ICD-10-CM

## 2018-02-15 DIAGNOSIS — Z79899 Other long term (current) drug therapy: Secondary | ICD-10-CM

## 2018-02-15 DIAGNOSIS — R7309 Other abnormal glucose: Secondary | ICD-10-CM | POA: Diagnosis not present

## 2018-02-15 DIAGNOSIS — E559 Vitamin D deficiency, unspecified: Secondary | ICD-10-CM | POA: Diagnosis not present

## 2018-02-15 DIAGNOSIS — J9611 Chronic respiratory failure with hypoxia: Secondary | ICD-10-CM | POA: Diagnosis not present

## 2018-02-15 DIAGNOSIS — E039 Hypothyroidism, unspecified: Secondary | ICD-10-CM

## 2018-02-15 DIAGNOSIS — I10 Essential (primary) hypertension: Secondary | ICD-10-CM | POA: Diagnosis not present

## 2018-02-15 MED ORDER — IPRATROPIUM-ALBUTEROL 0.5-2.5 (3) MG/3ML IN SOLN: 3.0000 mL | Freq: Four times a day (QID) | RESPIRATORY_TRACT | 11 refills | Status: AC

## 2018-02-16 LAB — HEMOGLOBIN A1C
Hgb A1c MFr Bld: 6.6 % of total Hgb — ABNORMAL HIGH (ref <5.7)
Mean Plasma Glucose: 143 (calc)
eAG (mmol/L): 7.9 (calc)

## 2018-02-16 LAB — URINALYSIS, ROUTINE W REFLEX MICROSCOPIC
BILIRUBIN URINE: NEGATIVE
Bacteria, UA: NONE SEEN /HPF
Glucose, UA: NEGATIVE
Hgb urine dipstick: NEGATIVE
Hyaline Cast: NONE SEEN /LPF
KETONES UR: NEGATIVE
NITRITE: NEGATIVE
RBC / HPF: NONE SEEN /HPF (ref 0–2)
SPECIFIC GRAVITY, URINE: 1.013 (ref 1.001–1.03)
SQUAMOUS EPITHELIAL / LPF: NONE SEEN /HPF (ref <=5)
WBC, UA: NONE SEEN /HPF (ref 0–5)
pH: 8 (ref 5.0–8.0)

## 2018-02-16 LAB — LIPID PANEL
CHOLESTEROL: 223 mg/dL — AB (ref <200)
HDL: 67 mg/dL (ref >50)
LDL CHOLESTEROL (CALC): 133 mg/dL — AB
Non-HDL Cholesterol (Calc): 156 mg/dL (calc) — ABNORMAL HIGH (ref <130)
TRIGLYCERIDES: 120 mg/dL (ref <150)
Total CHOL/HDL Ratio: 3.3 (calc) (ref <5.0)

## 2018-02-16 LAB — CBC WITH DIFFERENTIAL/PLATELET
BASOS ABS: 37 {cells}/uL (ref 0–200)
Basophils Relative: 0.5 %
EOS PCT: 3 %
Eosinophils Absolute: 222 cells/uL (ref 15–500)
HCT: 38.5 % (ref 35.0–45.0)
Hemoglobin: 12.3 g/dL (ref 11.7–15.5)
Lymphs Abs: 1754 cells/uL (ref 850–3900)
MCH: 29.2 pg (ref 27.0–33.0)
MCHC: 31.9 g/dL — AB (ref 32.0–36.0)
MCV: 91.4 fL (ref 80.0–100.0)
MPV: 12.1 fL (ref 7.5–12.5)
Monocytes Relative: 10.5 %
NEUTROS PCT: 62.3 %
Neutro Abs: 4610 cells/uL (ref 1500–7800)
Platelets: 214 10*3/uL (ref 140–400)
RBC: 4.21 10*6/uL (ref 3.80–5.10)
RDW: 12.8 % (ref 11.0–15.0)
Total Lymphocyte: 23.7 %
WBC mixed population: 777 cells/uL (ref 200–950)
WBC: 7.4 10*3/uL (ref 3.8–10.8)

## 2018-02-16 LAB — URINE CULTURE
MICRO NUMBER: 90624596
SPECIMEN QUALITY:: ADEQUATE

## 2018-02-16 LAB — COMPLETE METABOLIC PANEL WITH GFR
AG Ratio: 1.1 (calc) (ref 1.0–2.5)
ALKALINE PHOSPHATASE (APISO): 78 U/L (ref 33–130)
ALT: 8 U/L (ref 6–29)
AST: 16 U/L (ref 10–35)
Albumin: 3.7 g/dL (ref 3.6–5.1)
BUN/Creatinine Ratio: 21 (calc) (ref 6–22)
BUN: 27 mg/dL — AB (ref 7–25)
CALCIUM: 9.5 mg/dL (ref 8.6–10.4)
CO2: 34 mmol/L — AB (ref 20–32)
CREATININE: 1.28 mg/dL — AB (ref 0.60–0.88)
Chloride: 98 mmol/L (ref 98–110)
GFR, Est African American: 44 mL/min/{1.73_m2} — ABNORMAL LOW (ref >=60)
GFR, Est Non African American: 38 mL/min/{1.73_m2} — ABNORMAL LOW (ref >=60)
GLUCOSE: 90 mg/dL (ref 65–99)
Globulin: 3.4 g/dL (calc) (ref 1.9–3.7)
Potassium: 4.2 mmol/L (ref 3.5–5.3)
SODIUM: 140 mmol/L (ref 135–146)
Total Bilirubin: 0.4 mg/dL (ref 0.2–1.2)
Total Protein: 7.1 g/dL (ref 6.1–8.1)

## 2018-02-16 LAB — VITAMIN D 25 HYDROXY (VIT D DEFICIENCY, FRACTURES): Vit D, 25-Hydroxy: 35 ng/mL (ref 30–100)

## 2018-02-16 LAB — MAGNESIUM: MAGNESIUM: 2.1 mg/dL (ref 1.5–2.5)

## 2018-02-16 LAB — TSH: TSH: 4.78 mIU/L — ABNORMAL HIGH (ref 0.40–4.50)

## 2018-02-16 LAB — INSULIN, RANDOM: INSULIN: 1.7 u[IU]/mL — AB (ref 2.0–19.6)

## 2018-02-21 ENCOUNTER — Other Ambulatory Visit: Payer: Self-pay | Admitting: Cardiology

## 2018-02-21 NOTE — Telephone Encounter (Signed)
Rx sent to pharmacy   

## 2018-03-16 DIAGNOSIS — S81801A Unspecified open wound, right lower leg, initial encounter: Secondary | ICD-10-CM | POA: Diagnosis not present

## 2018-03-16 DIAGNOSIS — J9611 Chronic respiratory failure with hypoxia: Secondary | ICD-10-CM | POA: Diagnosis not present

## 2018-03-28 ENCOUNTER — Other Ambulatory Visit: Payer: Self-pay | Admitting: Adult Health

## 2018-03-28 DIAGNOSIS — R7981 Abnormal blood-gas level: Secondary | ICD-10-CM

## 2018-04-06 DIAGNOSIS — I739 Peripheral vascular disease, unspecified: Secondary | ICD-10-CM | POA: Diagnosis not present

## 2018-04-06 DIAGNOSIS — M79675 Pain in left toe(s): Secondary | ICD-10-CM | POA: Diagnosis not present

## 2018-04-06 DIAGNOSIS — L602 Onychogryphosis: Secondary | ICD-10-CM | POA: Diagnosis not present

## 2018-04-06 DIAGNOSIS — M79674 Pain in right toe(s): Secondary | ICD-10-CM | POA: Diagnosis not present

## 2018-04-15 DIAGNOSIS — S81801A Unspecified open wound, right lower leg, initial encounter: Secondary | ICD-10-CM | POA: Diagnosis not present

## 2018-04-15 DIAGNOSIS — J9611 Chronic respiratory failure with hypoxia: Secondary | ICD-10-CM | POA: Diagnosis not present

## 2018-04-25 ENCOUNTER — Other Ambulatory Visit: Payer: Self-pay | Admitting: Internal Medicine

## 2018-04-28 ENCOUNTER — Ambulatory Visit: Payer: Self-pay

## 2018-04-28 ENCOUNTER — Encounter: Payer: Self-pay | Admitting: Sports Medicine

## 2018-04-28 ENCOUNTER — Ambulatory Visit: Payer: Medicare Other | Admitting: Sports Medicine

## 2018-04-28 VITALS — BP 118/70 | HR 92 | Ht 63.0 in | Wt 98.6 lb

## 2018-04-28 DIAGNOSIS — M25561 Pain in right knee: Secondary | ICD-10-CM

## 2018-04-28 NOTE — Patient Instructions (Signed)
You had an injection today.  Things to be aware of after injection are listed below: . You may experience no significant improvement or even a slight worsening in your symptoms during the first 24 to 48 hours.  After that we expect your symptoms to improve gradually over the next 2 weeks for the medicine to have its maximal effect.  You should continue to have improvement out to 6 weeks after your injection. . Dr. Paulla Fore recommends icing the site of the injection for 20 minutes  1-2 times the day of your injection . You may shower but no swimming, tub bath or Jacuzzi for 24 hours. . If your bandage falls off this does not need to be replaced.  It is appropriate to remove the bandage after 4 hours. . You may resume light activities as tolerated unless otherwise directed per Dr. Paulla Fore during your visit  POSSIBLE STEROID SIDE EFFECTS:  Side effects from injectable steroids tend to be less than when taken orally however you may experience some of the symptoms listed below.  If experienced these should only last for a short period of time. Change in menstrual flow  Edema (swelling)  Increased appetite Skin flushing (redness)  Skin rash/acne  Thrush (oral) Yeast vaginitis    Increased sweating  Depression Increased blood glucose levels Cramping and leg/calf  Euphoria (feeling happy)  POSSIBLE PROCEDURE SIDE EFFECTS: The side effects of the injection are usually fairly minimal however if you may experience some of the following side effects that are usually self-limited and will is off on their own.  If you are concerned please feel free to call the office with questions:  Increased numbness or tingling  Nausea or vomiting  Swelling or bruising at the injection site   Please call our office if if you experience any of the following symptoms over the next week as these can be signs of infection:   Fever greater than 100.64F  Significant swelling at the injection site  Significant redness or drainage  from the injection site  If after 2 weeks you are continuing to have worsening symptoms please call our office to discuss what the next appropriate actions should be including the potential for a return office visit or other diagnostic testing.

## 2018-04-28 NOTE — Progress Notes (Signed)
Juanda Bond. [place], [place] at [facility] [place] - 84 y.o. female MRN [id number]  Date of birth: Jan 20, 1929  Visit Date: 04/28/2018  PCP: Unk Pinto, MD   Referred by: Unk Pinto, MD  Scribe(s) for today's visit: Wendy Poet, LAT, ATC  SUBJECTIVE:  Andrea [name] is here for New Patient (Initial Visit) (R knee pain ) .    Her R knee pain and swelling symptoms INITIALLY: Began about 2 weeks when she fell after getting tangled up in a water hose at her house.  She states that she got some abrasions and noticed fairly immediate swelling.  Has pain with standing after prolonged sitting.  Pt states that she is also having some numbness in her B feet. Described as moderate sharp and aching pain, radiating to R LE. Worsened with standing and taking first few steps after sitting for a prolonged period of time Improved with nothing noted Additional associated symptoms include: N/T in B feet but not new since she fell; no mechanical symptoms; swelling noted at R knee and bruising at R knee and R lower leg    At this time symptoms show no change compared to onset  She has been not doing much to treat her R knee pain and swelling.  R leg doppler - 07/14/17 R tib/fib - 06/27/17   REVIEW OF SYSTEMS: Denies night time disturbances. Denies fevers, chills, or night sweats. Denies unexplained weight loss. Reports personal history of cancer.  Yes to skin cancer. Reports changes in bowel or bladder habits.  Some constipation and feels like it has to do w/ her iron pills. Reports recent unreported falls.  See hx for details. Reports new or worsening dyspnea or wheezing. Denies headaches or dizziness.  Reports numbness, tingling or weakness  In the extremities - in B feet Denies dizziness or presyncopal episodes Reports lower extremity edema    HISTORY & PERTINENT PRIOR DATA:  Significant/pertinent history, findings,  studies include:  reports that she quit smoking about 43 years ago. Her smoking use included cigarettes. She has a 20.00 pack-year smoking history. She has never used smokeless tobacco. Recent Labs    02/15/18 1132 05/24/18 1143  HGBA1C 6.6* 5.9*   No specialty comments available. No problems updated.  Otherwise prior history reviewed and updated per electronic medical record.   OBJECTIVE:  VS:  HT:_0  (160 cm)   WT:98 lb 9.6 oz (44.7 kg)  BMI:17.47    BP:118/70  HR:92bpm  TEMP: ( )  RESP:94 %   PHYSICAL EXAM: CONSTITUTIONAL: Well-developed, Well-nourished and In no acute distress Alert & appropriately interactive. and Not depressed or anxious appearing. RESPIRATORY: No increased work of breathing and Trachea Midline EYES: Pupils are equal., EOM intact without nystagmus. and No scleral icterus.  Lower extremities: Warm and well perfused NEURO: unremarkable Normal associated myotomal distribution strength to manual muscle testing Normal sensation to light touch  MSK Exam: Right Knee  Alignment & Contours: normal Skin: abrasion, ecchymosis, no overlying erythema over the anterior lateral but small amount of abrasion and swelling anteriorly. Effusion: yes and small amount Generalized Synovitis: mild Knee Tenderness: Medial joint line and Lateral joint line Gait: stiff-legged Patellar grind produces: Mild pain   RANGE OF MOTION & STRENGTH  EXTENSION: Normal  with no pain.   Strength: Normal FLEXION: Normal with no pain.   Strength: Normal   LIGAMENTOUS TESTING  Varus & Valgus Strain: stable to testing Anterior & Posterior  Drawer: stable to testing    SPECIALITY TESTING:          PROCEDURES & DATA REVIEWED:  . US Guided Injection per procedure note  ASSESSMENT   1. Right knee pain, unspecified chronicity     PLAN:   Rest the injured area as much as practical Apply ice packs Compressive bandage recommended    . Please see procedure section and  notes. . If any lack of improvement consider further diagnostic evaluation with MRI of the knee . Consistent with contusion and underlying swelling/synovitis.  Should do well with an injection and continued RICE therapy. No problem-specific Assessment & Plan notes found for this encounter.  Follow-up: Return in about 1 month (around 05/26/2018).      Please see additional documentation for Objective, Assessment and Plan sections. Pertinent additional documentation may be included in corresponding procedure notes, imaging studies, problem based documentation and patient instructions. Please see these sections of the encounter for additional information regarding this visit.  CMA/ATC served as Education administrator during this visit. History, Physical, and Plan performed by medical provider. Documentation and orders reviewed and attested to.      Gerda Diss, [facility] Physician

## 2018-05-16 DIAGNOSIS — J9611 Chronic respiratory failure with hypoxia: Secondary | ICD-10-CM | POA: Diagnosis not present

## 2018-05-16 DIAGNOSIS — S81801A Unspecified open wound, right lower leg, initial encounter: Secondary | ICD-10-CM | POA: Diagnosis not present

## 2018-05-23 NOTE — Progress Notes (Signed)
3 month follow up  Assessment and Plan:  Essential hypertension - continue medications, DASH diet, exercise and monitor at home. Call if greater than 130/80.  -     CBC with Differential/Platelet -     CMP/GFR -     TSH  Chronic diastolic CHF (congestive heart failure) (HCC) Weight stable, monitor; has been doing lasix 40-80 mg instead of BID daily; discussed at length, ok to monitor closely with 40 mg daily, increase to 80 mg with any weight gain or dyspnea Limit sodium and fluids Go to ER for worsening dyspnea  AF (paroxysmal atrial fibrillation) (HCC) Continue elliquis for now, check CBC Rate controlled Followed by cardiology  Chronic respiratory failure with hypoxia (HCC)/Emphesema cont to monitor/follow up/O2 Long discussion about CO2 retention and O2 goal, titrate for 92% by pulse ox  Other specified hypothyroidism Hypothyroidism-check TSH level, continue medications the same, reminded to take on an empty stomach 30-62mns before food.  -     TSH  Hyperlipidemia, unspecified hyperlipidemia type No longer on treatment secondary to age/frailty Defer checking levels  CKD III (HCC) avoid NSAIDS, will monitor CMP/GFR  Vitamin D deficiency No longer supplementing, benefit limited at this point Defer checking level  Protein-calorie malnutrition, severe (HCC) Continue daily supplement, monitor CMP  Discussed med's effects and SE's. Screening labs and tests as requested with regular follow-up as recommended. Future Appointments  Date Time Provider DBostonia 06/01/2018 10:40 AM RGerda Diss DO [facility] 09/14/2018  2:30 PM MUnk Pinto MD [facility] None  11/23/2018 10:00 AM CVicie Mutters PA-C [facility] None    HPI  84y.o. female  Presents accompanied by her daughter for 3 month follow up on HTN, ASHD, COPD,  Hypothyroidism, HLD, Pre-Diabetes and Vitamin D Deficiency. She has COPD and follows with Dr. YAnnamaria Boots  she is now on O2 at night and  during the day with exertion. She is on diamox for CO2 retention, she has pulse ox at home and titrates for goal of 92%.    BMI is Body mass index is 18.17 kg/m. Weights closely monitored and stable at home.  Wt Readings from Last 3 Encounters:  05/24/18 102 lb 9.6 oz (46.5 kg)  04/28/18 98 lb 9.6 oz (44.7 kg)  02/15/18 101 lb 6.4 oz (46 kg)    Her blood pressure has been controlled at home, today their BP is BP: 104/60   She does workout. She denies chest pain, shortness of breath, dizziness.   She also follows with Dr. CStanford Breedfor aortic insuff, Afib, diastolic CHF. She has history of peripheral edema, and recurrent pleural effusion s/p thoracentesis. She is on lasix 40 mg - 80 mg daily and potassium.    Lab Results  Component Value Date   GFRNONAA 343(L) 02/15/2018   She is not on cholesterol medication secondary to age and frailty. Her cholesterol is not at goal. The cholesterol last visit was:   Lab Results  Component Value Date   CHOL 223 (H) 02/15/2018   HDL 67 02/15/2018   LDLCALC 133 (H) 02/15/2018   TRIG 120 02/15/2018   CHOLHDL 3.3 02/15/2018    She has been working on diet and exercise for prediabetes, and denies paresthesia of the feet, polydipsia, polyuria and visual disturbances. Last A1C in the office was:  Lab Results  Component Value Date   HGBA1C 6.6 (H) 02/15/2018   Patient is not on Vitamin D supplement.   Lab Results  Component Value Date   VD25OH 360  02/15/2018     She is on thyroid medication. Her medication was not changed last visit, she is on 1 pill daily.  Lab Results  Component Value Date   TSH 4.78 (H) 02/15/2018     Past Medical History:  Diagnosis Date  . Aortic regurgitation   . Bronchiectasis ([place])   . COPD (chronic obstructive pulmonary disease) ([place])   . DJD (degenerative joint disease)   . HLD (hyperlipidemia)   . HTN (hypertension)   . Hypothyroidism   . Palpitations    a. has known history of PAC's and PVC's. b. 09/2015:  monitor showed sinus rhythm with occasional PAC's and a brief episode of PAT.  . S/P thoracentesis      Current Medications:  Current Outpatient Medications on File Prior to Visit  Medication Sig Dispense Refill  . acetaZOLAMIDE (DIAMOX) 250 MG tablet TAKE ONE TABLET (250MG TOTAL) BY MOUTH DAILY 90 tablet 1  . apixaban (ELIQUIS) 2.5 MG TABS tablet Take 1 tablet (2.5 mg total) by mouth 2 (two) times daily. 180 tablet 3  . diltiazem (CARDIZEM) 60 MG tablet TAKE ONE (1) TABLET BY MOUTH FOUR (4) TIMES DAILY 120 tablet 3  . furosemide (LASIX) 40 MG tablet TAKE ONE TABLET (40MG TOTAL) BY MOUTH TWICE DAILY 180 tablet 1  . ipratropium-albuterol (DUONEB) 0.5-2.5 (3) MG/3ML SOLN Take 3 mLs by nebulization QID. 360 mL 11  . levothyroxine (SYNTHROID, LEVOTHROID) 50 MCG tablet TAKE ONE TABLET BY MOUTH ONCE A DAY AS DIRECTED 90 tablet 1  . metoprolol tartrate (LOPRESSOR) 50 MG tablet TAKE ONE TABLET (50MG TOTAL) BY MOUTH TWICE DAILY 180 tablet 1  . omeprazole (PRILOSEC) 40 MG capsule Take 1 capsule (40 mg total) by mouth daily. 90 capsule 1  . potassium chloride (K-DUR) 10 MEQ tablet Take 1 tablet (10 mEq total) by mouth daily. 30 tablet 2  . Probiotic Product (PROBIOTIC-10) CHEW Chew 2 tablets by mouth daily.    [name] Respiratory Therapy Supplies (FLUTTER) DEVI Use as directed. 1 each 0   No current facility-administered medications on file prior to visit.      Review of Systems  Constitutional: Negative for malaise/fatigue and weight loss.  HENT: Negative for hearing loss and tinnitus.   Eyes: Negative for blurred vision and double vision.  Respiratory: Negative for cough, shortness of breath and wheezing.   Cardiovascular: Negative for chest pain, palpitations, orthopnea, claudication and leg swelling.  Gastrointestinal: Negative for abdominal pain, blood in stool, constipation, diarrhea, heartburn, melena, nausea and vomiting.  Genitourinary: Negative.   Musculoskeletal: Negative for joint pain and  myalgias.  Skin: Negative for rash.  Neurological: Negative for dizziness, tingling, sensory change, weakness and headaches.  Endo/Heme/Allergies: Negative for polydipsia.  Psychiatric/Behavioral: Negative.   All other systems reviewed and are negative.     Physical Exam: Estimated body mass index is 18.17 kg/m as calculated from the following:   Height as of this encounter: _0  (1.6 m).   Weight as of this encounter: 102 lb 9.6 oz (46.5 kg). BP 104/60   Pulse 88   Temp (!) 97.5 F (36.4 C)   Ht _1  (1.6 m)   Wt 102 lb 9.6 oz (46.5 kg)   SpO2 90%   BMI 18.17 kg/m  General Appearance: Malnourished/underweight in no apparent distress.  Eyes: PERRLA, EOMs, conjunctiva no swelling or erythema, normal fundi and vessels.  Sinuses: No Frontal/maxillary tenderness  ENT/Mouth: Ext aud canals clear on the right but with cerumen impaction on the left, normal light  reflex with TMs without erythema, bulging. Good dentition. No erythema, swelling, or exudate on post pharynx. Tonsils not swollen or erythematous. Hearing decreased Neck: Supple, thyroid normal. No bruits  Respiratory: Respiratory effort normal, decreased breathsounds, BS equal bilaterally without rales, rhonchi, wheezing or stridor.  Cardio: RRR with holosystolic 2/6, prominent S2. Brisk peripheral pulses with scant edema, left leg chronically worse than right leg due to previous injury Chest: symmetric, with normal excursions and percussion.   Abdomen: Soft, nontender, no guarding, rebound, hernias, masses, or organomegaly. .  Lymphatics: Non tender without lymphadenopathy.  Musculoskeletal: Full ROM all peripheral extremities,4/5 strength, and antalgic gait with cane Skin: Warm, dry without rashes, lesions, ecchymosis. Neuro: Cranial nerves intact, reflexes equal bilaterally. Decreased muscle tone, no cerebellar symptoms. [name]  Psych: Awake and oriented X 3, normal affect, Insight and Judgment appropriate.    Gorden Harms  Shantia Sanford 11:38 AM [facility]

## 2018-05-24 ENCOUNTER — Ambulatory Visit (INDEPENDENT_AMBULATORY_CARE_PROVIDER_SITE_OTHER): Payer: Medicare Other | Admitting: Adult Health

## 2018-05-24 ENCOUNTER — Encounter: Payer: Self-pay | Admitting: Adult Health

## 2018-05-24 VITALS — BP 104/60 | HR 88 | Temp 97.5°F | Ht 63.0 in | Wt 102.6 lb

## 2018-05-24 DIAGNOSIS — R7303 Prediabetes: Secondary | ICD-10-CM | POA: Diagnosis not present

## 2018-05-24 DIAGNOSIS — J438 Other emphysema: Secondary | ICD-10-CM

## 2018-05-24 DIAGNOSIS — E039 Hypothyroidism, unspecified: Secondary | ICD-10-CM | POA: Diagnosis not present

## 2018-05-24 DIAGNOSIS — Z79899 Other long term (current) drug therapy: Secondary | ICD-10-CM | POA: Diagnosis not present

## 2018-05-24 DIAGNOSIS — N183 Chronic kidney disease, stage 3 unspecified: Secondary | ICD-10-CM

## 2018-05-24 DIAGNOSIS — I5032 Chronic diastolic (congestive) heart failure: Secondary | ICD-10-CM | POA: Diagnosis not present

## 2018-05-24 DIAGNOSIS — I480 Paroxysmal atrial fibrillation: Secondary | ICD-10-CM | POA: Diagnosis not present

## 2018-05-24 DIAGNOSIS — E782 Mixed hyperlipidemia: Secondary | ICD-10-CM

## 2018-05-24 DIAGNOSIS — E559 Vitamin D deficiency, unspecified: Secondary | ICD-10-CM

## 2018-05-24 DIAGNOSIS — I10 Essential (primary) hypertension: Secondary | ICD-10-CM

## 2018-05-24 DIAGNOSIS — E43 Unspecified severe protein-calorie malnutrition: Secondary | ICD-10-CM

## 2018-05-24 MED ORDER — ALPRAZOLAM 0.25 MG PO TABS: 0.1250 mg | ORAL_TABLET | Freq: Every day | ORAL | 0 refills | Status: DC

## 2018-05-24 MED ORDER — FUROSEMIDE 40 MG PO TABS: ORAL_TABLET | ORAL | 1 refills | Status: DC

## 2018-05-24 NOTE — Patient Instructions (Signed)
Home Oxygen Use, Adult    When a medical condition keeps you from getting enough oxygen, your health care provider may instruct you to take extra oxygen at home. Your health care provider will let you know:   When to take oxygen.   For how long to take oxygen.   How quickly oxygen should be delivered (flow rate), in liters per minute (LPM or L/M).    Home oxygen can be given through:   A mask.   A nasal cannula. This is a device or tube that goes in the nostrils.   A transtracheal catheter. This is a small, flexible tube placed in the trachea.   A tracheostomy. This is a surgically made opening in the trachea.    These devices are connected with tubing to an oxygen source, such as:   A tank. Tanks hold oxygen in gas form. They must be replaced when the oxygen is used up.   A liquid oxygen device. This holds oxygen in liquid form. It must be replaced when the oxygen is used up.   An oxygen concentrator machine. This filters oxygen in the room. It uses electricity, so you must have a backup cylinder of oxygen in case the power goes out.    Supplies needed:  To use oxygen, you will need:   A mask, nasal cannula, transtracheal catheter, or tracheostomy.   An oxygen tank, a liquid oxygen device, or an oxygen concentrator.   The tape that your health care provider recommends (optional).    If you use a transtracheal catheter and your prescribed flow rate is 1 LPM or greater, you will also need a humidifier.  Risks and complications   Fire. This can happen if the oxygen is exposed to a heat source, flame, or spark.   Injury to skin. This can happen if liquid oxygen touches your skin.   Organ damage. This can happen if you get too little oxygen.  How to use oxygen    Your health care provider will show you how to use your oxygen device. Follow her or his instructions. They may look something like this:  1. Wash your hands.  2. If you use an oxygen concentrator, make sure it is plugged in.  3. Place one  end of the tube into the port on the tank, device, or machine.  4. Place the mask over your nose and mouth. Or, place the nasal cannula and secure it with tape if instructed. If you use a tracheostomy or transtracheal catheter, connect it to the oxygen source as directed.  5. Make sure the liter-flow setting on the machine is at the level prescribed by your health care provider.  6. Turn on the machine or adjust the knob on the tank or device to the correct liter-flow setting.  7. When you are done, turn off and unplug the machine, or turn the knob to OFF.    How to clean and care for the oxygen supplies  Nasal cannula   Clean it with a warm, wet cloth daily or as needed.   Wash it with a liquid soap once a week.   Rinse it thoroughly once or twice a week.   Replace it every 2-4 weeks.   If you have an infection, such as a cold or pneumonia, change the cannula when you get better.  Mask   Replace it every 2-4 weeks.   If you have an infection, such as a cold or pneumonia, change the mask when   you get better.  Humidifier bottle   Wash the bottle between each refill:  ? Wash it with soap and warm water.  ? Rinse it thoroughly.  ? Disinfect it and its top.  ? Air-dry it.   Make sure it is dry before you refill it.  Oxygen concentrator   Clean the air filter at least twice a week according to directions from your home medical equipment and service company.   Wipe down the cabinet every day. To do this:  ? Unplug the unit.  ? Wipe down the cabinet with a damp cloth.  ? Dry the cabinet.  Other equipment   Change any extra tubing every 1-3 months.   Follow instructions from your health care provider about taking care of any other equipment.  Safety tips  Fire safety tips       Keep your oxygen and oxygen supplies at least 5 ft away from sources of heat, flames, and sparks at all times.   Do not allow smoking near your oxygen. Put up "no smoking" signs in your home.   Do not use materials that can burn  (are flammable) while you use oxygen.   When you go to a restaurant with portable oxygen, ask to be seated in the nonsmoking section.   Keep a fire extinguisher close by. Let your fire department know that you have oxygen in your home.   Test your home smoke detectors regularly.  General safety tips   If you use an oxygen cylinder, make sure it is in a stand or secured to an object that will not move (fixed object).   If you use liquid oxygen, make sure its container is kept upright.   If you use an oxygen concentrator:  ? Tell your electric company. Make sure you are given priority service in the event that your power goes out.  ? Avoid using extension cords, if possible.  Follow these instructions at home:   Use oxygen only as told by your health care provider.   Do not use alcohol or other drugs that make you relax (sedating drugs) unless instructed. They can slow down your breathing rate and make it hard to get in enough oxygen.   Know how and when to order a refill of oxygen.   Always keep a spare tank of oxygen. Plan ahead for holidays when you may not be able to get a prescription filled.   Use water-based lubricants on your lips or nostrils. Do not use oil-based products like petroleum jelly.   To prevent skin irritation on your cheeks or behind your ears, tuck some gauze under the tubing.  Contact a health care provider if:   You get headaches often.   You have shortness of breath.   You have a lasting cough.   You have anxiety.   You are sleepy all the time.   You develop an illness that affects your breathing.   You cannot exercise at your regular level.   You are restless.   You have difficult or irregular breathing, and it is getting worse.   You have a fever.   You have persistent redness under your nose.  Get help right away if:   You are confused.   You have blue lips or fingernails.   You are struggling to breathe.  This information is not intended to replace advice given  to you by your health care provider. Make sure you discuss any questions you have with your health

## 2018-05-25 LAB — COMPLETE METABOLIC PANEL WITH GFR
AG Ratio: 1.2 (calc) (ref 1.0–2.5)
ALBUMIN MSPROF: 3.8 g/dL (ref 3.6–5.1)
ALKALINE PHOSPHATASE (APISO): 89 U/L (ref 33–130)
ALT: 7 U/L (ref 6–29)
AST: 14 U/L (ref 10–35)
BILIRUBIN TOTAL: 0.5 mg/dL (ref 0.2–1.2)
BUN / CREAT RATIO: 22 (calc) (ref 6–22)
BUN: 25 mg/dL (ref 7–25)
CHLORIDE: 101 mmol/L (ref 98–110)
CO2: 32 mmol/L (ref 20–32)
Calcium: 9.5 mg/dL (ref 8.6–10.4)
Creat: 1.16 mg/dL — ABNORMAL HIGH (ref 0.60–0.88)
GFR, Est African American: 49 mL/min/{1.73_m2} — ABNORMAL LOW (ref >=60)
GFR, Est Non African American: 42 mL/min/{1.73_m2} — ABNORMAL LOW (ref >=60)
GLUCOSE: 118 mg/dL — AB (ref 65–99)
Globulin: 3.3 g/dL (calc) (ref 1.9–3.7)
Potassium: 3.9 mmol/L (ref 3.5–5.3)
Sodium: 143 mmol/L (ref 135–146)
Total Protein: 7.1 g/dL (ref 6.1–8.1)

## 2018-05-25 LAB — CBC WITH DIFFERENTIAL/PLATELET
BASOS ABS: 20 {cells}/uL (ref 0–200)
Basophils Relative: 0.3 %
EOS ABS: 80 {cells}/uL (ref 15–500)
Eosinophils Relative: 1.2 %
HCT: 41 % (ref 35.0–45.0)
Hemoglobin: 12.9 g/dL (ref 11.7–15.5)
Lymphs Abs: 1213 cells/uL (ref 850–3900)
MCH: 30.6 pg (ref 27.0–33.0)
MCHC: 31.5 g/dL — AB (ref 32.0–36.0)
MCV: 97.2 fL (ref 80.0–100.0)
MONOS PCT: 8.5 %
MPV: 12.4 fL (ref 7.5–12.5)
NEUTROS PCT: 71.9 %
Neutro Abs: 4817 cells/uL (ref 1500–7800)
PLATELETS: 192 10*3/uL (ref 140–400)
RBC: 4.22 10*6/uL (ref 3.80–5.10)
RDW: 11.5 % (ref 11.0–15.0)
TOTAL LYMPHOCYTE: 18.1 %
WBC mixed population: 570 cells/uL (ref 200–950)
WBC: 6.7 10*3/uL (ref 3.8–10.8)

## 2018-05-25 LAB — HEMOGLOBIN A1C
HEMOGLOBIN A1C: 5.9 %{Hb} — AB (ref <5.7)
Mean Plasma Glucose: 123 (calc)
eAG (mmol/L): 6.8 (calc)

## 2018-05-25 LAB — TSH: TSH: 4.5 m[IU]/L (ref 0.40–4.50)

## 2018-05-25 LAB — MAGNESIUM: Magnesium: 2 mg/dL (ref 1.5–2.5)

## 2018-05-28 ENCOUNTER — Encounter: Payer: Self-pay | Admitting: Sports Medicine

## 2018-05-28 NOTE — Procedures (Signed)
PROCEDURE NOTE:  Ultrasound Guided: Injection: Right knee Images were obtained and interpreted by myself, Teresa Coombs, DO  Images have been saved and stored to PACS system. Images obtained on: GE S7 Ultrasound machine    ULTRASOUND FINDINGS:  Moderate effusion with generalized synovitis.  There is also additional mild hematoma of the anterior knee.  The effusion is likely contributing to the majority of her symptoms.  Mild synovitis.  No overlying erythema over the anterior lateral knee.  DESCRIPTION OF PROCEDURE:  The patient's clinical condition is marked by substantial pain and/or significant functional disability. Other conservative therapy has not provided relief, is contraindicated, or not appropriate. There is a reasonable likelihood that injection will significantly improve the patient's pain and/or functional impairment.   After discussing the risks, benefits and expected outcomes of the injection and all questions were reviewed and answered, the patient wished to undergo the above named procedure.  Verbal consent was obtained.  The ultrasound was used to identify the target structure and adjacent neurovascular structures. The skin was then prepped in sterile fashion and the target structure was injected under direct visualization using sterile technique as below:  Single injection performed as below: PREP: Alcohol and Ethel Chloride APPROACH:superiolateral, single injection, 25g 1.5 in. INJECTATE: 2 cc 0.5% Marcaine and 2 cc 65m/mL DepoMedrol ASPIRATE: None DRESSING: Band-Aid and 6-inch Ace Wrap  Post procedural instructions including recommending icing and warning signs for infection were reviewed.    This procedure was well tolerated and there were no complications.   IMPRESSION: Succesful Ultrasound Guided: Injection

## 2018-06-01 ENCOUNTER — Ambulatory Visit: Payer: Medicare Other | Admitting: Sports Medicine

## 2018-06-14 ENCOUNTER — Encounter: Payer: Self-pay | Admitting: Sports Medicine

## 2018-06-14 ENCOUNTER — Ambulatory Visit: Payer: Medicare Other | Admitting: Sports Medicine

## 2018-06-14 ENCOUNTER — Ambulatory Visit: Payer: Self-pay

## 2018-06-14 VITALS — BP 120/76 | HR 103 | Ht 63.0 in | Wt 97.4 lb

## 2018-06-14 DIAGNOSIS — M25511 Pain in right shoulder: Secondary | ICD-10-CM

## 2018-06-14 DIAGNOSIS — M25561 Pain in right knee: Secondary | ICD-10-CM | POA: Diagnosis not present

## 2018-06-14 NOTE — Progress Notes (Signed)
Juanda Bond. [place], [place] at [facility] - 84 y.o. female MRN [id number]  Date of birth: 12/20/1928  Visit Date: 06/14/2018  PCP: Unk Pinto, MD   Referred by: Unk Pinto, MD  Scribe(s) for today's visit: Josepha Pigg, CMA  SUBJECTIVE:  JENICE LEINER is here for Follow-up (R knee) and Initial Assessment (Right shoulder)  HPI  04/28/2018: Her R knee pain and swelling symptoms INITIALLY: Began about 2 weeks when she fell after getting tangled up in a water hose at her house.  She states that she got some abrasions and noticed fairly immediate swelling.  Has pain with standing after prolonged sitting.  Pt states that she is also having some numbness in her B feet. Described as moderate sharp and aching pain, radiating to R LE. Worsened with standing and taking first few steps after sitting for a prolonged period of time Improved with nothing noted Additional associated symptoms include: N/T in B feet but not new since she fell; no mechanical symptoms; swelling noted at R knee and bruising at R knee and R lower leg   At this time symptoms show no change compared to onset  She has been not doing much to treat her R knee pain and swelling. R leg doppler - 07/14/17 R tib/fib - 06/27/17  06/14/2018: Compared to the last office visit, her previously described symptoms are improving.  Current symptoms are mild & are non-radiating. She occasionally/seldomly feels like the knee is "out of wack" but overall she is improving.  She reports increased n/t in her feet.  She is not currently taking anything for the knee pain.  She received Depo-Medrol injection and tolerated well.   She c/o R shoulder pain.  Pain started around the same time as R knee pain, s/p fall.  She reports decreased ROM. Sx are worse when reaching across her body. Pain is all over the shoulder and radiates into the R side of her neck.  She  denies clickin but she has noticed a grinding sensation and popping.  She denies n/t in her R arm or hand.   REVIEW OF SYSTEMS: Denies night time disturbances. Denies fevers, chills, or night sweats. Denies unexplained weight loss. Reports personal history of cancer.  Yes to skin cancer. Reports changes in bowel or bladder habits.  Some constipation and feels like it has to do w/ her iron pills. Reports recent unreported falls.  See hx for details. Reports new or worsening dyspnea or wheezing. Denies headaches or dizziness.  Reports numbness, tingling or weakness  In the extremities - in B feet Denies dizziness or presyncopal episodes Reports lower extremity edema    HISTORY & PERTINENT PRIOR DATA:  Significant/pertinent history, findings, studies include:  reports that she quit smoking about 43 years ago. Her smoking use included cigarettes. She has a 20.00 pack-year smoking history. She has never used smokeless tobacco. Recent Labs    02/15/18 1132 05/24/18 1143  HGBA1C 6.6* 5.9*   No specialty comments available. No problems updated.  Otherwise prior history reviewed and updated per electronic medical record.   OBJECTIVE:  VS:  HT:_0  (160 cm)   WT:97 lb 6.4 oz (44.2 kg)  BMI:17.26    BP:120/76  HR:(!) 103bpm  TEMP: ( )  RESP:100 %   PHYSICAL EXAM: CONSTITUTIONAL: Well-developed, Well-nourished and In no acute distress Psychiatric: Alert & appropriately interactive. and Not depressed or anxious appearing. RESPIRATORY: No increased work  of breathing and Trachea Midline EYES: Pupils are equal., EOM intact without nystagmus. and No scleral icterus.  Upper and Lower extremities: EXTREMITY EXAM: Warm and well perfused NEURO: unremarkable Normal associated myotomal distribution strength to manual muscle testing Normal sensation to light touch  MSK Exam: Right Knee  Good improvement and knee pain with palpation.  Overall doing well.  No significant swelling or  effusion.   RANGE OF MOTION & STRENGTH  EXTENSION: Normal  with no pain.   Strength: Normal FLEXION: Normal with no pain.   Strength: Normal   LIGAMENTOUS TESTING  Varus & Valgus Strain: stable to testing Anterior & Posterior Drawer: stable to testing     Right Shoulder Exam: Well aligned, no significant deformity. No overlying skin changes. Neck: limited flexion and extension Non tender to palpation over: globally, Bony Landmarks Axial loading produces: Mild pain and Moderate crepitation Drop arm test: negative  Hawkins: positive, moderate pain Neers: positive, severe pain  Empty Can: normal, no pain Strength: 4/5 Speed's: positive, mild pain Strength: 4/5   ASSESSMENT   1. Right shoulder pain, unspecified chronicity   2. Right knee pain, unspecified chronicity     PLAN:  Pertinent additional documentation may be included in corresponding procedure notes, imaging studies, problem based documentation and patient instructions.  Procedures:  . US Guided Injection per procedure note  Medications:  No orders of the defined types were placed in this encounter.  Discussion/Instructions: No problem-specific Assessment & Plan notes found for this encounter.  . Her knee is doing significantly better.  We will have her continue with conservative for this. . Shoulder has significant pain limitations consistent with severe osteoarthritic changes.  Intra-articular injection performed today.  Continue to work on gentle range of motion. . Discussed red flag symptoms that warrant earlier emergent evaluation and patient voices understanding. . Activity modifications and the importance of avoiding exacerbating activities (limiting pain to no more than a 4 / 10 during or following activity) recommended and discussed.  Follow-up:  . Return if symptoms worsen or fail to improve.   . If any lack of improvement consider: further diagnostic evaluation with Plain film x-rays  .       CMA/ATC served as Education administrator during this visit. History, Physical, and Plan performed by medical provider. Documentation and orders reviewed and attested to.      Gerda Diss, [facility] Physician

## 2018-06-14 NOTE — Patient Instructions (Signed)
You had an injection today.  Things to be aware of after injection are listed below: . You may experience no significant improvement or even a slight worsening in your symptoms during the first 24 to 48 hours.  After that we expect your symptoms to improve gradually over the next 2 weeks for the medicine to have its maximal effect.  You should continue to have improvement out to 6 weeks after your injection. . Dr. Paulla Fore recommends icing the site of the injection for 20 minutes  1-2 times the day of your injection . You may shower but no swimming, tub bath or Jacuzzi for 24 hours. . If your bandage falls off this does not need to be replaced.  It is appropriate to remove the bandage after 4 hours. . You may resume light activities as tolerated unless otherwise directed per Dr. Paulla Fore during your visit  POSSIBLE STEROID SIDE EFFECTS:  Side effects from injectable steroids tend to be less than when taken orally however you may experience some of the symptoms listed below.  If experienced these should only last for a short period of time. Change in menstrual flow  Edema (swelling)  Increased appetite Skin flushing (redness)  Skin rash/acne  Thrush (oral) Yeast vaginitis    Increased sweating  Depression Increased blood glucose levels Cramping and leg/calf  Euphoria (feeling happy)  POSSIBLE PROCEDURE SIDE EFFECTS: The side effects of the injection are usually fairly minimal however if you may experience some of the following side effects that are usually self-limited and will is off on their own.  If you are concerned please feel free to call the office with questions:  Increased numbness or tingling  Nausea or vomiting  Swelling or bruising at the injection site   Please call our office if if you experience any of the following symptoms over the next week as these can be signs of infection:   Fever greater than 100.8F  Significant swelling at the injection site  Significant redness or drainage  from the injection site  If after 2 weeks you are continuing to have worsening symptoms please call our office to discuss what the next appropriate actions should be including the potential for a return office visit or other diagnostic testing.

## 2018-06-14 NOTE — Procedures (Signed)
PROCEDURE NOTE:  Ultrasound Guided: Injection: Right shoulder Images were obtained and interpreted by myself, Teresa Coombs, DO  Images have been saved and stored to PACS system. Images obtained on: GE S7 Ultrasound machine    ULTRASOUND FINDINGS:  Marked degenerative changes.  DESCRIPTION OF PROCEDURE:  The patient's clinical condition is marked by substantial pain and/or significant functional disability. Other conservative therapy has not provided relief, is contraindicated, or not appropriate. There is a reasonable likelihood that injection will significantly improve the patient's pain and/or functional impairment.   After discussing the risks, benefits and expected outcomes of the injection and all questions were reviewed and answered, the patient wished to undergo the above named procedure.  Verbal consent was obtained.  The ultrasound was used to identify the target structure and adjacent neurovascular structures. The skin was then prepped in sterile fashion and the target structure was injected under direct visualization using sterile technique as below:  Single injection performed as below: PREP: Alcohol and Ethel Chloride APPROACH:posterior, single injection, 22g 1.5 in. INJECTATE: 2 cc 0.5% Marcaine and 2 cc 76m/mL DepoMedrol ASPIRATE: None DRESSING: Band-Aid  Post procedural instructions including recommending icing and warning signs for infection were reviewed.    This procedure was well tolerated and there were no complications.   IMPRESSION: Succesful Ultrasound Guided: Injection

## 2018-06-15 ENCOUNTER — Encounter: Payer: Self-pay | Admitting: Sports Medicine

## 2018-06-16 DIAGNOSIS — S81801A Unspecified open wound, right lower leg, initial encounter: Secondary | ICD-10-CM | POA: Diagnosis not present

## 2018-06-16 DIAGNOSIS — J9611 Chronic respiratory failure with hypoxia: Secondary | ICD-10-CM | POA: Diagnosis not present

## 2018-07-06 ENCOUNTER — Other Ambulatory Visit: Payer: Self-pay | Admitting: *Deleted

## 2018-07-06 DIAGNOSIS — R7981 Abnormal blood-gas level: Secondary | ICD-10-CM

## 2018-07-06 MED ORDER — ACETAZOLAMIDE 250 MG PO TABS: ORAL_TABLET | ORAL | 1 refills | Status: DC

## 2018-07-16 DIAGNOSIS — J9611 Chronic respiratory failure with hypoxia: Secondary | ICD-10-CM | POA: Diagnosis not present

## 2018-07-16 DIAGNOSIS — S81801A Unspecified open wound, right lower leg, initial encounter: Secondary | ICD-10-CM | POA: Diagnosis not present

## 2018-07-27 ENCOUNTER — Other Ambulatory Visit: Payer: Self-pay | Admitting: Cardiology

## 2018-08-02 NOTE — Progress Notes (Signed)
HPI: FU AI,atrial fibrillation,diastolic CHF, palpitations and hypertension. Nuclear study in October 2009 revealed an ejection fraction of 73% and normal perfusion. CardioNet in February of 2014 showed sinus rhythm with PACs, PVCs, occasional junctional escape beats and pauses greater than 3 seconds. Monitor in Jan 2017 showed sinus rhythm with PACs and brief PAT.Last echocardiogram November 2018 showed normal LV function, grade 2 diastolic dysfunction, mild aortic and mitral regurgitation, moderate tricuspid regurgitation, small pericardial effusion and right pleural effusion. Has also had pleural effusions requiring thoracentesis (transudate; cytology negative).  Since last seen, she denies to apnea, chest pain or syncope.  She does have palpitations at times.  Current Outpatient Medications  Medication Sig Dispense Refill  . acetaZOLAMIDE (DIAMOX) 250 MG tablet TAKE ONE TABLET (250MG TOTAL) BY MOUTH DAILY 30 tablet 1  . ALPRAZolam (XANAX) 0.25 MG tablet Take 0.5-1 tablets (0.125-0.25 mg total) by mouth at bedtime. 30 tablet 0  . apixaban (ELIQUIS) 2.5 MG TABS tablet Take 1 tablet (2.5 mg total) by mouth 2 (two) times daily. 180 tablet 3  . diltiazem (CARDIZEM) 60 MG tablet TAKE ONE (1) TABLET BY MOUTH FOUR (4) TIMES DAILY 120 tablet 3  . furosemide (LASIX) 40 MG tablet Take 1 tab by mouth 1-2 times daily for swelling. 180 tablet 1  . ipratropium-albuterol (DUONEB) 0.5-2.5 (3) MG/3ML SOLN Take 3 mLs by nebulization QID. 360 mL 11  . levothyroxine (SYNTHROID, LEVOTHROID) 50 MCG tablet TAKE ONE TABLET BY MOUTH ONCE A DAY AS DIRECTED 90 tablet 1  . metoprolol tartrate (LOPRESSOR) 50 MG tablet TAKE ONE TABLET (50MG TOTAL) BY MOUTH TWICE DAILY 180 tablet 1  . omeprazole (PRILOSEC) 40 MG capsule Take 1 capsule (40 mg total) by mouth daily. 90 capsule 1  . potassium chloride (K-DUR) 10 MEQ tablet Take 1 tablet (10 mEq total) by mouth daily. 30 tablet 2  . Probiotic Product (PROBIOTIC-10) CHEW  Chew 2 tablets by mouth daily.    [name] Respiratory Therapy Supplies (FLUTTER) DEVI Use as directed. 1 each 0   No current facility-administered medications for this visit.      Past Medical History:  Diagnosis Date  . Aortic regurgitation   . Bronchiectasis ([place])   . COPD (chronic obstructive pulmonary disease) ([place])   . DJD (degenerative joint disease)   . HLD (hyperlipidemia)   . HTN (hypertension)   . Hypothyroidism   . Palpitations    a. has known history of PAC's and PVC's. b. 09/2015: monitor showed sinus rhythm with occasional PAC's and a brief episode of PAT.  . S/P thoracentesis     Past Surgical History:  Procedure Laterality Date  . ABDOMINAL HYSTERECTOMY  1993  . CATARACT EXTRACTION, BILATERAL    . WOUND DEBRIDEMENT Right 06/29/2017   Procedure: RIGHT LOWER  LEG DEBRIDEMENT;  Surgeon: [name], MD;  Location: [facility];  Service: General;  Laterality: Right;    Social History   Socioeconomic History  . Marital status: Married    Spouse name: Not on file  . Number of children: 5  . Years of education: Not on file  . Highest education level: Not on file  Occupational History  . Occupation: retired  Scientific laboratory technician  . Financial resource strain: Not on file  . Food insecurity:    Worry: Not on file    Inability: Not on file  . Transportation needs:    Medical: Not on file    Non-medical: Not on file  Tobacco Use  . Smoking  status: Former Smoker    Packs/day: 1.00    Years: 20.00    Pack years: 20.00    Types: Cigarettes    Last attempt to quit: 09/27/1974    Years since quitting: 43.9  . Smokeless tobacco: Never Used  Substance and Sexual Activity  . Alcohol use: No  . Drug use: No  . Sexual activity: Never  Lifestyle  . Physical activity:    Days per week: Not on file    Minutes per session: Not on file  . Stress: Not on file  Relationships  . Social connections:    Talks on phone: Not on file    Gets together: Not on file    Attends  religious service: Not on file    Active member of club or organization: Not on file    Attends meetings of clubs or organizations: Not on file    Relationship status: Not on file  . Intimate partner violence:    Fear of current or ex partner: Not on file    Emotionally abused: Not on file    Physically abused: Not on file    Forced sexual activity: Not on file  Other Topics Concern  . Not on file  Social History Narrative  . Not on file    Family History  Problem Relation Age of Onset  . Emphysema Mother   . Diabetes Mother   . Cancer Father   . Emphysema Sister   . Emphysema Sister   . Rheumatic fever Sister   . Heart attack Sister   . Diabetes Sister   . Diabetes Brother   . Asthma Brother        as a child    ROS: no fevers or chills, productive cough, hemoptysis, dysphasia, odynophagia, melena, hematochezia, dysuria, hematuria, rash, seizure activity, orthopnea, PND, pedal edema, claudication. Remaining systems are negative.  Physical Exam: Well-developed frail in no acute distress.  Skin is warm and dry.  HEENT is normal.  Neck is supple.  Chest with diminished BS  Cardiovascular exam is irregular Abdominal exam nontender or distended. No masses palpated. Extremities show no edema. neuro grossly intact  Electrocardiogram today shows atrial fibrillation at a rate of 92.  No ST changes.  Left ventricular hypertrophy.  Personally reviewed.  A/P  1 chronic diastolic congestive heart failure-patient appears to be euvolemic on examination today.  We discussed the importance of fluid restriction and low-sodium diet.  Continue present dose of Lasix.   2 paroxysmal atrial fibrillation-patient is in atrial fibrillation today.  However I am not convinced her symptoms are different.  We will plan rate control for now.  Continue Cardizem but changed to 240 mg CD daily.  Continue metoprolol.  Continue apixaban.   3 hypertension-blood pressure is controlled today.  Continue  present medications and follow.  4 aortic insufficiency-mild on most recent echocardiogram.  Kirk Ruths, MD

## 2018-08-03 ENCOUNTER — Other Ambulatory Visit: Payer: Self-pay

## 2018-08-03 DIAGNOSIS — R7981 Abnormal blood-gas level: Secondary | ICD-10-CM

## 2018-08-03 MED ORDER — ACETAZOLAMIDE 250 MG PO TABS: ORAL_TABLET | ORAL | 1 refills | Status: DC

## 2018-08-03 NOTE — Addendum Note (Signed)
Addended by: Chancy Hurter on: 08/03/2018 09:13 AM   Modules accepted: Orders

## 2018-08-03 NOTE — Telephone Encounter (Signed)
Acetazolamide refill request.

## 2018-08-14 ENCOUNTER — Encounter: Payer: Self-pay | Admitting: Cardiology

## 2018-08-14 ENCOUNTER — Encounter

## 2018-08-14 ENCOUNTER — Ambulatory Visit: Payer: Medicare Other | Admitting: Cardiology

## 2018-08-14 VITALS — BP 106/82 | HR 100 | Ht 63.0 in | Wt 99.2 lb

## 2018-08-14 DIAGNOSIS — I10 Essential (primary) hypertension: Secondary | ICD-10-CM | POA: Diagnosis not present

## 2018-08-14 DIAGNOSIS — I5032 Chronic diastolic (congestive) heart failure: Secondary | ICD-10-CM

## 2018-08-14 DIAGNOSIS — I4819 Other persistent atrial fibrillation: Secondary | ICD-10-CM | POA: Diagnosis not present

## 2018-08-14 MED ORDER — DILTIAZEM HCL ER COATED BEADS 240 MG PO CP24: 240.0000 mg | ORAL_CAPSULE | Freq: Every day | ORAL | 3 refills | Status: AC

## 2018-08-14 NOTE — Patient Instructions (Signed)
Medication Instructions:  STOP CURRENT DILTIAZEM  START DILTIAZEM CD 240 MG ONCE DAILY If you need a refill on your cardiac medications before your next appointment, please call your pharmacy.   Lab work: If you have labs (blood work) drawn today and your tests are completely normal, you will receive your results only by: [name] MyChart Message (if you have MyChart) OR . A paper copy in the mail If you have any lab test that is abnormal or we need to change your treatment, we will call you to review the results.  Follow-Up: Your physician recommends that you schedule a follow-up appointment in: [place]

## 2018-08-16 DIAGNOSIS — J9611 Chronic respiratory failure with hypoxia: Secondary | ICD-10-CM | POA: Diagnosis not present

## 2018-08-16 DIAGNOSIS — S81801A Unspecified open wound, right lower leg, initial encounter: Secondary | ICD-10-CM | POA: Diagnosis not present

## 2018-08-22 ENCOUNTER — Telehealth: Payer: Self-pay | Admitting: Cardiology

## 2018-08-22 NOTE — Telephone Encounter (Signed)
New message    Pt daughter is calling with questions about medication assistance. Please call.

## 2018-08-22 NOTE — Telephone Encounter (Signed)
Pts daughter called to report that she will bring in her Patient Assistance forms for the Eliquis to [place] next week all filled out so we can fax them in for her.

## 2018-08-29 ENCOUNTER — Other Ambulatory Visit: Payer: Self-pay | Admitting: Internal Medicine

## 2018-08-31 ENCOUNTER — Telehealth: Payer: Self-pay | Admitting: Cardiology

## 2018-08-31 NOTE — Telephone Encounter (Signed)
Spoke with pt's daughter who was inquiring about pt assistance forms for Eliquis that she dropped off last week. Informed daughter that Dr. Jacalyn Lefevre nurse is not in the office today, but will route a message to have her call her.

## 2018-08-31 NOTE — Telephone Encounter (Signed)
New message:   Patient daughter calling about some papers that need to fax concerning some medication. Please call daughter.

## 2018-09-01 NOTE — Telephone Encounter (Signed)
Spoke with pt dtr, aware I have the paperwork and will send in closer to the end of the month.

## 2018-09-13 NOTE — Progress Notes (Signed)
6 MONTH FOLLOW UP AND MEDICARE WELLNESS  Assessment and Plan:  Encounter for Medicare annual wellness exam 1 year  Essential hypertension - continue medications, DASH diet, exercise and monitor at home. Call if greater than 130/80.  -     CBC with Differential/Platelet -     CMP/GFR -     TSH  Pulmonary hypertension due to COPD ([place]) No triggers, well controlled symptoms, cont to monitor/follow up/O2  Chronic diastolic CHF (congestive heart failure) (HCC) Weight stable, monitor  Atrial tachycardia (HCC) Continue meds  Aortic atherosclerosis (HCC) Control blood pressure, cholesterol, glucose, increase exercise.   AF (paroxysmal atrial fibrillation) (HCC) Continue elliquis for now, check CBC with questionable recent melana  Other emphysema ([place]) No triggers, well controlled symptoms, cont to monitor  Chronic respiratory failure with hypoxia ([place]) cont to monitor/follow up/O2  Other specified hypothyroidism -     TSH  Medication management -     Magnesium  Hyperlipidemia, unspecified hyperlipidemia type -     Lipid panel  Iron deficiency -     Iron,Total/Total Iron Binding Cap  Skin cancer Monitor  Osteoporosis, unspecified osteoporosis type, unspecified pathological fracture presence Monitor  Renal insufficiency Increase fluids, avoid NSAIDS, monitor sugars, will monitor  Osteoarthritis of shoulder, unspecified laterality, unspecified osteoarthritis type Monitor  Vitamin D deficiency Supplement  Protein-calorie malnutrition, severe (HCC) Samples, get on ensure once daily  Lung nodules Monitor; followed by pulmonology  High risk falls - PT referral placed after discussion with patient, discussed falls, fall risk prevention in home, need to use cane consistently, exercises for strengthening/balance disucssed, information provided  Left cerumen impaction Apparently recurrent, patient adamantly declining irrigation at our office, family requests  referral to ENT, does better with ? Suction cleaning rather than irrigation Reminded to avoid using Q-tips  Discussed med's effects and SE's. Screening labs and tests as requested with regular follow-up as recommended. Future Appointments  Date Time Provider [place]  11/20/2018 10:40 AM Lelon Perla, MD [facility] [place]  12/20/2018  2:00 PM Vicie Mutters, PA-C [id number] None    Plan:   During the course of the visit the patient was educated and counseled about appropriate screening and preventive services including:    Pneumococcal vaccine   Prevnar 13  Influenza vaccine  Td vaccine  Screening electrocardiogram  Bone densitometry screening  Colorectal cancer screening  Diabetes screening  Glaucoma screening  Nutrition counseling   Advanced directives: requested    HPI  84 y.o. female  Presents accompanied by her daughter for 6 month follow up and medicare wellness. She livers with her husband who drives, assisted also by her daughters when needed.   Patient has COPD & is on nocturnal O2, is on Diamox for CO2 retention - followed by Dr Keturah Barre.  She also has a portable O2 concentrator. Resting O2 sats usually range in the low 90's %, today is 89% on RA without dyspnea, but with activity as walking about 40-50 feet, she quickly desats to the low to mid 80's%.   Has ulcer on right leg,had debridgement 06/29/2017 and has been doing well since. LE edema significantly improved taking lasix 40 mg daily, increases to 80 mg PRN for increasing edema. She reports she has not needed this recently.   Patient is also followed by Dr Stanford Breed for AoI, cAfib and chronic diastolic CHF.  She denies orthopnea, paroxysmal nocturnal dyspnea and edema. Positive for exertional dyspnea consistent with recent baseline.  Wt Readings from Last 3 Encounters:  09/14/18 99  lb (44.9 kg)  08/14/18 99 lb 3.2 oz (45 kg)  06/14/18 97 lb 6.4 oz (44.2 kg)    BMI is Body mass  index is 17.54 kg/m., she has not been working on diet and exercise.   Her blood pressure has been controlled at home, today their BP is BP: 130/70  She does not workout. She denies chest paindizziness.   CKD 3 is monitored closely and has been stable:  Lab Results  Component Value Date   GFRNONAA 42 (L) 05/24/2018   She is not on cholesterol medication and denies myalgias. Her cholesterol is not at goal. The cholesterol last visit was:   Lab Results  Component Value Date   CHOL 223 (H) 02/15/2018   HDL 67 02/15/2018   LDLCALC 133 (H) 02/15/2018   TRIG 120 02/15/2018   CHOLHDL 3.3 02/15/2018    She has not been working on diet and exercise for prediabetes, and denies paresthesia of the feet, polydipsia, polyuria and visual disturbances. Last A1C in the office was:  Lab Results  Component Value Date   HGBA1C 5.9 (H) 05/24/2018   Patient reports she is not currently on Vitamin D supplement.   Lab Results  Component Value Date   VD25OH 35 02/15/2018     She is on thyroid medication. Her medication was not changed last visit, 1/2 pill except Fri/Sun 1 pill. Lab Results  Component Value Date   TSH 4.50 05/24/2018     Current Medications:  Current Outpatient Medications on File Prior to Visit  Medication Sig Dispense Refill  . acetaZOLAMIDE (DIAMOX) 250 MG tablet TAKE ONE TABLET (250MG TOTAL) BY MOUTH DAILY 30 tablet 1  . ALPRAZolam (XANAX) 0.25 MG tablet Take 0.5-1 tablets (0.125-0.25 mg total) by mouth at bedtime. 30 tablet 0  . apixaban (ELIQUIS) 2.5 MG TABS tablet Take 1 tablet (2.5 mg total) by mouth 2 (two) times daily. 180 tablet 3  . diltiazem (CARDIZEM CD) 240 MG 24 hr capsule Take 1 capsule (240 mg total) by mouth daily. 90 capsule 3  . furosemide (LASIX) 40 MG tablet Take 1 tab by mouth 1-2 times daily for swelling. 180 tablet 1  . ipratropium-albuterol (DUONEB) 0.5-2.5 (3) MG/3ML SOLN Take 3 mLs by nebulization QID. 360 mL 11  . levothyroxine (SYNTHROID,  LEVOTHROID) 50 MCG tablet TAKE ONE (1) TABLET BY MOUTH EVERY DAY AS DIRECTED 90 tablet 1  . metoprolol tartrate (LOPRESSOR) 50 MG tablet TAKE ONE TABLET (50MG TOTAL) BY MOUTH TWICE DAILY 180 tablet 1  . omeprazole (PRILOSEC) 40 MG capsule Take 1 capsule (40 mg total) by mouth daily. 90 capsule 1  . potassium chloride (K-DUR) 10 MEQ tablet Take 1 tablet (10 mEq total) by mouth daily. 30 tablet 2  . Probiotic Product (PROBIOTIC-10) CHEW Chew 2 tablets by mouth daily.    [name] Respiratory Therapy Supplies (FLUTTER) DEVI Use as directed. 1 each 0   No current facility-administered medications on file prior to visit.    Health Maintenance:   Immunization History  Administered Date(s) Administered  . Influenza Split 07/16/2013  . Influenza, High Dose Seasonal PF 06/25/2015, 06/10/2016, 06/30/2017  . Influenza,inj,Quad PF,6+ Mos 07/04/2014  . Pneumococcal Conjugate-13 02/26/2014  . Pneumococcal Polysaccharide-23 10/04/2016  . Td 06/26/2013  . Tdap 06/19/2017   Last colonoscopy: 2008, declines another Last mammogram: 07/06/2013, declines another Last pap smear/pelvic exam: remote, declines another DEXA: 07/06/2013 + osteoporosis CXR 05/2016 Echo 03/2016 Dr. Stanford Breed  Prior vaccinations: TD or Tdap: 2014 Influenza: 05/2017 DUE  Pneumococcal:  2008 Prevnar 13: 02/2014 Shingles/Zostavax: declines  1. [facility]- here for primary care 2. Dr. Nicki Reaper, eye doctor, last visit April 2017, has scheduled Jan 2020 3. unknown, dentist, last visit 2 years  Patient Care Team: Unk Pinto, MD as PCP - General (Internal Medicine) Stanford Breed Denice Bors, MD as PCP - Cardiology (Cardiology) Deneise Lever, MD as Consulting Physician (Pulmonary Disease) Lelon Perla, MD as Consulting Physician (Cardiology) Inda Castle, MD (Inactive) as Consulting Physician (Gastroenterology) Rolm Bookbinder, MD as Consulting Physician (Dermatology)  Medical History:   Past Medical History:  Diagnosis Date  . Aortic regurgitation   . Bronchiectasis ([place])   . COPD (chronic obstructive pulmonary disease) ([place])   . DJD (degenerative joint disease)   . HLD (hyperlipidemia)   . HTN (hypertension)   . Hypothyroidism   . Palpitations    a. has known history of PAC's and PVC's. b. 09/2015: monitor showed sinus rhythm with occasional PAC's and a brief episode of PAT.  . S/P thoracentesis    Allergies Allergies  Allergen Reactions  . Vancomycin     Worsening kidney function  . Sulfa Antibiotics Palpitations    Unknown    SURGICAL HISTORY She  has a past surgical history that includes Abdominal hysterectomy (1993); Cataract extraction, bilateral; and Wound debridement (Right, 06/29/2017). FAMILY HISTORY Her family history includes Asthma in her brother; Cancer in her father; Diabetes in her brother, mother, and sister; Emphysema in her mother, sister, and sister; Heart attack in her sister; Rheumatic fever in her sister. SOCIAL HISTORY She  reports that she quit smoking about 43 years ago. Her smoking use included cigarettes. She has a 20.00 pack-year smoking history. She has never used smokeless tobacco. She reports that she does not drink alcohol or use drugs.   MEDICARE WELLNESS OBJECTIVES: Physical activity: Current Exercise Habits: The patient does not participate in regular exercise at present, Exercise limited by: orthopedic condition(s);respiratory conditions(s) Cardiac risk factors: Cardiac Risk Factors include: dyslipidemia;hypertension;sedentary lifestyle;advanced age (>46mn, >>19women) Depression/mood screen:   [facility] 09/14/2018  Decreased Interest 0  Down, Depressed, Hopeless 0  PHQ - 2 Score 0  Some recent data might be hidden    ADLs:  In your present state of health, do you have any difficulty performing the following activities: 09/14/2018 02/15/2018  Hearing? N N  Vision? N N  Difficulty concentrating or making  decisions? N N  Walking or climbing stairs? Y N  Comment respiratory failure -  Dressing or bathing? N N  Doing errands, shopping? Y N  Comment driven by family  -  PConservation officer, natureand eating ? N -  Using the Toilet? N -  In the past six months, have you accidently leaked urine? N -  Do you have problems with loss of bowel control? N -  Managing your Medications? N -  Managing your Finances? Y -  Comment family helps -  Housekeeping or managing your Housekeeping? Y -  Comment family helps -  Some recent data might be hidden     Cognitive Testing  Alert? Yes  Normal Appearance?Yes  Oriented to person? Yes  Place? Yes   Time? Yes  Recall of three objects?  Yes  Can perform simple calculations? Yes  Displays appropriate judgment?Yes  Can read the correct time from a watch face?Yes  EOL planning: Does Patient Have a Medical Advance Directive?: No Would patient like information on creating a medical advance directive?: Yes (MAU/Ambulatory/Procedural Areas -  Information given)   Review of Systems  Constitutional: Negative for chills, fever and malaise/fatigue.  HENT: Positive for hearing loss (going to see ENT). Negative for congestion, ear discharge and sore throat.   Respiratory: Negative for cough, shortness of breath and wheezing.   Cardiovascular: Negative for chest pain, palpitations and leg swelling.  Gastrointestinal: Negative for blood in stool, constipation, diarrhea and melena.  Genitourinary: Negative.   Skin: Negative.   Neurological: Negative for dizziness, sensory change, loss of consciousness and headaches.  Psychiatric/Behavioral: Negative for depression. The patient is not nervous/anxious and does not have insomnia.      Physical Exam: Estimated body mass index is 17.54 kg/m as calculated from the following:   Height as of this encounter: _0  (1.6 m).   Weight as of this encounter: 99 lb (44.9 kg). BP 130/70   Pulse 60   Temp 97.9 F (36.6 C)   Ht 5'  3" (1.6 m)   Wt 99 lb (44.9 kg)   SpO2 90%   BMI 17.54 kg/m  General Appearance: Malnourished/underweight in no apparent distress.  Eyes: PERRLA, EOMs, conjunctiva no swelling or erythema, normal fundi and vessels.  Sinuses: No Frontal/maxillary tenderness  ENT/Mouth: Ext aud canals clear on the right but with cerumen impaction on the left, normal light reflex with TMs without erythema, bulging. Good dentition. No erythema, swelling, or exudate on post pharynx. Tonsils not swollen or erythematous. Hearing decreased Neck: Supple, thyroid normal. No bruits  Respiratory: Respiratory effort normal, decreased breathsounds, BS equal bilaterally without rales, rhonchi, wheezing or stridor.  Cardio: RRR with holosystolic 2/6, prominent S2. Brisk peripheral pulses with 1+ edema  Chest: symmetric, with normal excursions and percussion.   Abdomen: Soft, nontender, no guarding, rebound, hernias, masses, or organomegaly. .  Lymphatics: Non tender without lymphadenopathy.  Genitourinary: no masses, loose sphincter, + hemmocult, no hemorrhoids Musculoskeletal: Full ROM all peripheral extremities,4/5 strength, and antalgic gait with cane Skin: Warm, dry without rashes, lesions, ecchymosis. Neuro: Cranial nerves intact, reflexes equal bilaterally. Normal muscle tone, no cerebellar symptoms. [name]  Psych: Awake and oriented X 3, normal affect, Insight and Judgment appropriate.    Medicare Attestation I have personally reviewed: The patient's medical and social history Their use of alcohol, tobacco or illicit drugs Their current medications and supplements The patient's functional ability including ADLs,fall risks, home safety risks, cognitive, and hearing and visual impairment Diet and physical activities Evidence for depression or mood disorders  The patient's weight, height, BMI, and visual acuity have been recorded in the chart.  I have made referrals, counseling, and provided education to the patient  based on review of the above and I have provided the patient with a written personalized care plan for preventive services.     Izora Ribas 5:31 PM [facility]

## 2018-09-14 ENCOUNTER — Ambulatory Visit (INDEPENDENT_AMBULATORY_CARE_PROVIDER_SITE_OTHER): Payer: Medicare Other | Admitting: Adult Health

## 2018-09-14 ENCOUNTER — Ambulatory Visit: Payer: Self-pay | Admitting: Internal Medicine

## 2018-09-14 ENCOUNTER — Encounter: Payer: Self-pay | Admitting: Adult Health

## 2018-09-14 VITALS — BP 130/70 | HR 60 | Temp 97.9°F | Ht 63.0 in | Wt 99.0 lb

## 2018-09-14 DIAGNOSIS — I471 Supraventricular tachycardia: Secondary | ICD-10-CM

## 2018-09-14 DIAGNOSIS — Z0001 Encounter for general adult medical examination with abnormal findings: Secondary | ICD-10-CM | POA: Diagnosis not present

## 2018-09-14 DIAGNOSIS — I10 Essential (primary) hypertension: Secondary | ICD-10-CM | POA: Diagnosis not present

## 2018-09-14 DIAGNOSIS — I5032 Chronic diastolic (congestive) heart failure: Secondary | ICD-10-CM | POA: Diagnosis not present

## 2018-09-14 DIAGNOSIS — E559 Vitamin D deficiency, unspecified: Secondary | ICD-10-CM | POA: Diagnosis not present

## 2018-09-14 DIAGNOSIS — E039 Hypothyroidism, unspecified: Secondary | ICD-10-CM

## 2018-09-14 DIAGNOSIS — J449 Chronic obstructive pulmonary disease, unspecified: Secondary | ICD-10-CM

## 2018-09-14 DIAGNOSIS — I2723 Pulmonary hypertension due to lung diseases and hypoxia: Secondary | ICD-10-CM

## 2018-09-14 DIAGNOSIS — J9611 Chronic respiratory failure with hypoxia: Secondary | ICD-10-CM

## 2018-09-14 DIAGNOSIS — I480 Paroxysmal atrial fibrillation: Secondary | ICD-10-CM | POA: Diagnosis not present

## 2018-09-14 DIAGNOSIS — J438 Other emphysema: Secondary | ICD-10-CM

## 2018-09-14 DIAGNOSIS — M810 Age-related osteoporosis without current pathological fracture: Secondary | ICD-10-CM

## 2018-09-14 DIAGNOSIS — J479 Bronchiectasis, uncomplicated: Secondary | ICD-10-CM

## 2018-09-14 DIAGNOSIS — J90 Pleural effusion, not elsewhere classified: Secondary | ICD-10-CM

## 2018-09-14 DIAGNOSIS — H6122 Impacted cerumen, left ear: Secondary | ICD-10-CM

## 2018-09-14 DIAGNOSIS — M19019 Primary osteoarthritis, unspecified shoulder: Secondary | ICD-10-CM

## 2018-09-14 DIAGNOSIS — E43 Unspecified severe protein-calorie malnutrition: Secondary | ICD-10-CM

## 2018-09-14 DIAGNOSIS — I700 Atherosclerosis of aorta: Secondary | ICD-10-CM | POA: Diagnosis not present

## 2018-09-14 DIAGNOSIS — Z79899 Other long term (current) drug therapy: Secondary | ICD-10-CM

## 2018-09-14 DIAGNOSIS — R6889 Other general symptoms and signs: Secondary | ICD-10-CM | POA: Diagnosis not present

## 2018-09-14 DIAGNOSIS — E782 Mixed hyperlipidemia: Secondary | ICD-10-CM | POA: Diagnosis not present

## 2018-09-14 DIAGNOSIS — N183 Chronic kidney disease, stage 3 unspecified: Secondary | ICD-10-CM

## 2018-09-14 DIAGNOSIS — C4490 Unspecified malignant neoplasm of skin, unspecified: Secondary | ICD-10-CM

## 2018-09-14 DIAGNOSIS — R7303 Prediabetes: Secondary | ICD-10-CM

## 2018-09-14 DIAGNOSIS — Z0000 Encounter for general adult medical examination without abnormal findings: Secondary | ICD-10-CM

## 2018-09-14 DIAGNOSIS — Z9181 History of falling: Secondary | ICD-10-CM

## 2018-09-14 NOTE — Patient Instructions (Addendum)
Ms. Bossard , Thank you for taking time to come for your Medicare Wellness Visit. I appreciate your ongoing commitment to your health goals. Please review the following plan we discussed and let me know if I can assist you in the future.   This is a list of the screening recommended for you and due dates:  Health Maintenance  Topic Date Due  . Flu Shot  04/27/2018  . Tetanus Vaccine  06/20/2027  . DEXA scan (bone density measurement)  Completed  . Pneumonia vaccines  Completed    Please increase water intake - at least 40-45 fluid ounces daily      Chronic Kidney Disease, Adult Chronic kidney disease (CKD) happens when the kidneys are damaged over a long period of time. The kidneys are two organs that help with:  Getting rid of waste and extra fluid from the blood.  Making hormones that maintain the amount of fluid in your tissues and blood vessels.  Making sure that the body has the right amount of fluids and chemicals. Most of the time, CKD does not go away, but it can usually be controlled. Steps must be taken to slow down the kidney damage or to stop it from getting worse. If this is not done, the kidneys may stop working. Follow these instructions at home: Medicines  Take over-the-counter and prescription medicines only as told by your doctor. You may need to change the amount of medicines you take.  Do not take any new medicines unless your doctor says it is okay. Many medicines can make your kidney damage worse.  Do not take any vitamin and supplements unless your doctor says it is okay. Many vitamins and supplements can make your kidney damage worse. General instructions  Follow a diet as told by your doctor. You may need to stay away from: ? Alcohol. ? Salty foods. ? Foods that are high in:  Potassium.  Calcium.  Protein.  Do not use any products that contain nicotine or tobacco, such as cigarettes and e-cigarettes. If you need help quitting, ask your  doctor.  Keep track of your blood pressure at home. Tell your doctor about any changes.  If you have diabetes, keep track of your blood sugar as told by your doctor.  Try to stay at a healthy weight. If you need help, ask your doctor.  Exercise at least 30 minutes a day, 5 days a week.  Stay up-to-date with your shots (immunizations) as told by your doctor.  Keep all follow-up visits as told by your doctor. This is important. Contact a doctor if:  Your symptoms get worse.  You have new symptoms. Get help right away if:  You have symptoms of end-stage kidney disease. These may include: ? Headaches. ? Numbness in your hands or feet. ? Easy bruising. ? Having hiccups often. ? Chest pain. ? Shortness of breath. ? Stopping of menstrual periods in women.  You have a fever.  You have very little pee (urine).  You have pain or bleeding when you pee. Summary  Chronic kidney disease (CKD) happens when the kidneys are damaged over a long period of time.  Most of the time, this condition does not go away, but it can usually be controlled. Steps must be taken to slow down the kidney damage or to stop it from getting worse.  Treatment may include a combination of medicines and lifestyle changes. This information is not intended to replace advice given to you by your health care provider.  Make sure you discuss any questions you have with your health care provider. Document Released: 12/08/2009 Document Revised: 10/18/2016 Document Reviewed: 10/18/2016 Elsevier Interactive Patient Education  2019 [employer].

## 2018-09-15 ENCOUNTER — Other Ambulatory Visit: Payer: Self-pay | Admitting: Adult Health

## 2018-09-15 DIAGNOSIS — J9611 Chronic respiratory failure with hypoxia: Secondary | ICD-10-CM | POA: Diagnosis not present

## 2018-09-15 DIAGNOSIS — S81801A Unspecified open wound, right lower leg, initial encounter: Secondary | ICD-10-CM | POA: Diagnosis not present

## 2018-09-15 LAB — CBC WITH DIFFERENTIAL/PLATELET
Absolute Monocytes: 518 cells/uL (ref 200–950)
BASOS PCT: 0.3 %
Basophils Absolute: 21 cells/uL (ref 0–200)
EOS PCT: 1 %
Eosinophils Absolute: 71 cells/uL (ref 15–500)
HCT: 42.2 % (ref 35.0–45.0)
Hemoglobin: 13.4 g/dL (ref 11.7–15.5)
Lymphs Abs: 1477 cells/uL (ref 850–3900)
MCH: 30.2 pg (ref 27.0–33.0)
MCHC: 31.8 g/dL — ABNORMAL LOW (ref 32.0–36.0)
MCV: 95.3 fL (ref 80.0–100.0)
MPV: 12.7 fL — AB (ref 7.5–12.5)
Monocytes Relative: 7.3 %
Neutro Abs: 5013 cells/uL (ref 1500–7800)
Neutrophils Relative %: 70.6 %
PLATELETS: 198 10*3/uL (ref 140–400)
RBC: 4.43 10*6/uL (ref 3.80–5.10)
RDW: 11.3 % (ref 11.0–15.0)
TOTAL LYMPHOCYTE: 20.8 %
WBC: 7.1 10*3/uL (ref 3.8–10.8)

## 2018-09-15 LAB — COMPLETE METABOLIC PANEL WITH GFR
AG RATIO: 1.1 (calc) (ref 1.0–2.5)
ALT: 8 U/L (ref 6–29)
AST: 17 U/L (ref 10–35)
Albumin: 4.3 g/dL (ref 3.6–5.1)
Alkaline phosphatase (APISO): 90 U/L (ref 33–130)
BUN/Creatinine Ratio: 28 (calc) — ABNORMAL HIGH (ref 6–22)
BUN: 33 mg/dL — AB (ref 7–25)
CALCIUM: 10.2 mg/dL (ref 8.6–10.4)
CO2: 37 mmol/L — ABNORMAL HIGH (ref 20–32)
CREATININE: 1.19 mg/dL — AB (ref 0.60–0.88)
Chloride: 100 mmol/L (ref 98–110)
GFR, EST NON AFRICAN AMERICAN: 41 mL/min/{1.73_m2} — AB (ref >=60)
GFR, Est African American: 48 mL/min/{1.73_m2} — ABNORMAL LOW (ref >=60)
GLUCOSE: 82 mg/dL (ref 65–99)
Globulin: 3.9 g/dL (calc) — ABNORMAL HIGH (ref 1.9–3.7)
Potassium: 5 mmol/L (ref 3.5–5.3)
Sodium: 145 mmol/L (ref 135–146)
TOTAL PROTEIN: 8.2 g/dL — AB (ref 6.1–8.1)
Total Bilirubin: 0.5 mg/dL (ref 0.2–1.2)

## 2018-09-15 LAB — LIPID PANEL
CHOL/HDL RATIO: 2.9 (calc) (ref <5.0)
Cholesterol: 234 mg/dL — ABNORMAL HIGH (ref <200)
HDL: 80 mg/dL (ref >50)
LDL CHOLESTEROL (CALC): 134 mg/dL — AB
NON-HDL CHOLESTEROL (CALC): 154 mg/dL — AB (ref <130)
TRIGLYCERIDES: 96 mg/dL (ref <150)

## 2018-09-15 LAB — MAGNESIUM: Magnesium: 2.3 mg/dL (ref 1.5–2.5)

## 2018-09-15 LAB — VITAMIN D 25 HYDROXY (VIT D DEFICIENCY, FRACTURES): Vit D, 25-Hydroxy: 36 ng/mL (ref 30–100)

## 2018-09-15 LAB — TSH: TSH: 2.74 m[IU]/L (ref 0.40–4.50)

## 2018-09-15 LAB — HEMOGLOBIN A1C
Hgb A1c MFr Bld: 6.1 % of total Hgb — ABNORMAL HIGH (ref <5.7)
Mean Plasma Glucose: 128 (calc)
eAG (mmol/L): 7.1 (calc)

## 2018-09-15 MED ORDER — CHOLECALCIFEROL 50 MCG (2000 UT) PO CAPS: 2000.0000 [IU] | ORAL_CAPSULE | Freq: Every day | ORAL | Status: AC

## 2018-10-09 ENCOUNTER — Other Ambulatory Visit: Payer: Self-pay | Admitting: Adult Health

## 2018-10-09 DIAGNOSIS — R7981 Abnormal blood-gas level: Secondary | ICD-10-CM

## 2018-10-11 ENCOUNTER — Telehealth: Payer: Self-pay | Admitting: Cardiology

## 2018-10-11 NOTE — Telephone Encounter (Signed)
New Message   Pts daughter is calling because their was a page that was not dated in the application for [employer]. They sent a fax back and need the forgotten page dated and sent back.  Pt daughter can come sign tomorrow afternoon.  Please call

## 2018-10-13 NOTE — Telephone Encounter (Signed)
Spoke with Pt's daughter and informed her per Hilda Blades (Dr. Jacalyn Lefevre nurse), she was able to add the date and has already faxed the paperwork. Daughter verbalized understanding.

## 2018-10-16 DIAGNOSIS — S81801A Unspecified open wound, right lower leg, initial encounter: Secondary | ICD-10-CM | POA: Diagnosis not present

## 2018-10-16 DIAGNOSIS — J9611 Chronic respiratory failure with hypoxia: Secondary | ICD-10-CM | POA: Diagnosis not present

## 2018-10-18 DIAGNOSIS — H26493 Other secondary cataract, bilateral: Secondary | ICD-10-CM | POA: Diagnosis not present

## 2018-11-03 DIAGNOSIS — H1851 Endothelial corneal dystrophy: Secondary | ICD-10-CM | POA: Diagnosis not present

## 2018-11-06 ENCOUNTER — Other Ambulatory Visit: Payer: Self-pay | Admitting: Internal Medicine

## 2018-11-06 NOTE — Progress Notes (Signed)
HPI: FU AI,atrial fibrillation,diastolic CHF, palpitations and hypertension. Nuclear study in October 2009 revealed an ejection fraction of 73% and normal perfusion. CardioNet in February of 2014 showed sinus rhythm with PACs, PVCs, occasional junctional escape beats and pauses greater than 3 seconds. Monitor in Jan 2017 showed sinus rhythm with PACs and brief PAT.Last echocardiogram November 2018 showed normal LV function, grade 2 diastolic dysfunction, mild aortic and mitral regurgitation, moderate tricuspid regurgitation, small pericardial effusion and right pleural effusion. Has also had pleural effusions requiring thoracentesis (transudate; cytology negative). Since last seen,she has chronic dyspnea on exertion unchanged.  No orthopnea or PND.  Minimal pedal edema.  No chest pain or syncope.  Current Outpatient Medications  Medication Sig Dispense Refill  . acetaZOLAMIDE (DIAMOX) 250 MG tablet TAKE ONE TABLET (250MG TOTAL) BY MOUTH DAILY 30 tablet 1  . ALPRAZolam (XANAX) 0.25 MG tablet Take 0.5-1 tablets (0.125-0.25 mg total) by mouth at bedtime. 30 tablet 0  . apixaban (ELIQUIS) 2.5 MG TABS tablet Take 1 tablet (2.5 mg total) by mouth 2 (two) times daily. 180 tablet 3  . Cholecalciferol 50 MCG (2000 UT) CAPS Take 1 capsule (2,000 Units total) by mouth daily. 30 each   . diltiazem (CARDIZEM CD) 240 MG 24 hr capsule Take 1 capsule (240 mg total) by mouth daily. 90 capsule 3  . furosemide (LASIX) 40 MG tablet Take 1 tab by mouth 1-2 times daily for swelling. 180 tablet 1  . ipratropium-albuterol (DUONEB) 0.5-2.5 (3) MG/3ML SOLN Take 3 mLs by nebulization QID. 360 mL 11  . levothyroxine (SYNTHROID, LEVOTHROID) 50 MCG tablet TAKE ONE TABLET BY MOUTH ONCE A DAY AS DIRECTED 90 tablet 1  . metoprolol tartrate (LOPRESSOR) 50 MG tablet TAKE ONE TABLET (50MG TOTAL) BY MOUTH TWICE DAILY 180 tablet 1  . potassium chloride (K-DUR) 10 MEQ tablet Take 1 tablet (10 mEq total) by mouth daily. 30  tablet 2  . Probiotic Product (PROBIOTIC-10) CHEW Chew 2 tablets by mouth daily.    [name] Respiratory Therapy Supplies (FLUTTER) DEVI Use as directed. 1 each 0   No current facility-administered medications for this visit.      Past Medical History:  Diagnosis Date  . Aortic regurgitation   . Bronchiectasis ([place])   . COPD (chronic obstructive pulmonary disease) ([place])   . DJD (degenerative joint disease)   . HLD (hyperlipidemia)   . HTN (hypertension)   . Hypothyroidism   . Palpitations    a. has known history of PAC's and PVC's. b. 09/2015: monitor showed sinus rhythm with occasional PAC's and a brief episode of PAT.  . S/P thoracentesis     Past Surgical History:  Procedure Laterality Date  . ABDOMINAL HYSTERECTOMY  1993  . CATARACT EXTRACTION, BILATERAL    . WOUND DEBRIDEMENT Right 06/29/2017   Procedure: RIGHT LOWER  LEG DEBRIDEMENT;  Surgeon: [name], MD;  Location: [facility];  Service: General;  Laterality: Right;    Social History   Socioeconomic History  . Marital status: Married    Spouse name: Not on file  . Number of children: 5  . Years of education: Not on file  . Highest education level: Not on file  Occupational History  . Occupation: retired  Scientific laboratory technician  . Financial resource strain: Not on file  . Food insecurity:    Worry: Not on file    Inability: Not on file  . Transportation needs:    Medical: Not on file    Non-medical: Not  on file  Tobacco Use  . Smoking status: Former Smoker    Packs/day: 1.00    Years: 20.00    Pack years: 20.00    Types: Cigarettes    Last attempt to quit: 09/27/1974    Years since quitting: 44.1  . Smokeless tobacco: Never Used  Substance and Sexual Activity  . Alcohol use: No  . Drug use: No  . Sexual activity: Never  Lifestyle  . Physical activity:    Days per week: Not on file    Minutes per session: Not on file  . Stress: Not on file  Relationships  . Social connections:    Talks on phone: Not on file     Gets together: Not on file    Attends religious service: Not on file    Active member of club or organization: Not on file    Attends meetings of clubs or organizations: Not on file    Relationship status: Not on file  . Intimate partner violence:    Fear of current or ex partner: Not on file    Emotionally abused: Not on file    Physically abused: Not on file    Forced sexual activity: Not on file  Other Topics Concern  . Not on file  Social History Narrative  . Not on file    Family History  Problem Relation Age of Onset  . Emphysema Mother   . Diabetes Mother   . Cancer Father   . Emphysema Sister   . Emphysema Sister   . Rheumatic fever Sister   . Heart attack Sister   . Diabetes Sister   . Diabetes Brother   . Asthma Brother        as a child    ROS: no fevers or chills, productive cough, hemoptysis, dysphasia, odynophagia, melena, hematochezia, dysuria, hematuria, rash, seizure activity, orthopnea, PND, pedal edema, claudication. Remaining systems are negative.  Physical Exam: Well-developed frail in no acute distress.  Skin is warm and dry.  HEENT is normal.  Neck is supple.  Chest diminished breath sounds bases right greater than left Cardiovascular exam is irregular Abdominal exam nontender or distended. No masses palpated. Extremities show trace edema. neuro grossly intact  ECG- Atrial fibrillation; no ST changes; personally reviewed  A/P  1 paroxysmal atrial fibrillation-patient remains in atrial fibrillation today.  Her symptoms appear to be stable.  I therefore feel rate control and anticoagulation is appropriate.  Continue Cardizem at present dose as well as metoprolol.  Continue apixaban.  2 chronic diastolic congestive heart failure-she appears to be euvolemic today.  Continue present dose of diuretic.  Continue fluid restriction and low-sodium diet.  3 hypertension-blood pressure is controlled.  Continue present medications and follow.  4  aortic insufficiency-mild on most recent echocardiogram.  Kirk Ruths, MD

## 2018-11-09 IMAGING — CR DG CHEST 1V PORT
1 series · 2 of 2 positions shown · non-contrast
Comparison: Portable exam 1400 hours compared to 09/15/2017

CLINICAL DATA: RIGHT pleural effusion post thoracentesis, CHF

EXAM:
PORTABLE CHEST 1 VIEW

[Series 1: portable · 0.17mm/px · 2 of 2 slices shown]
[im 1/2]
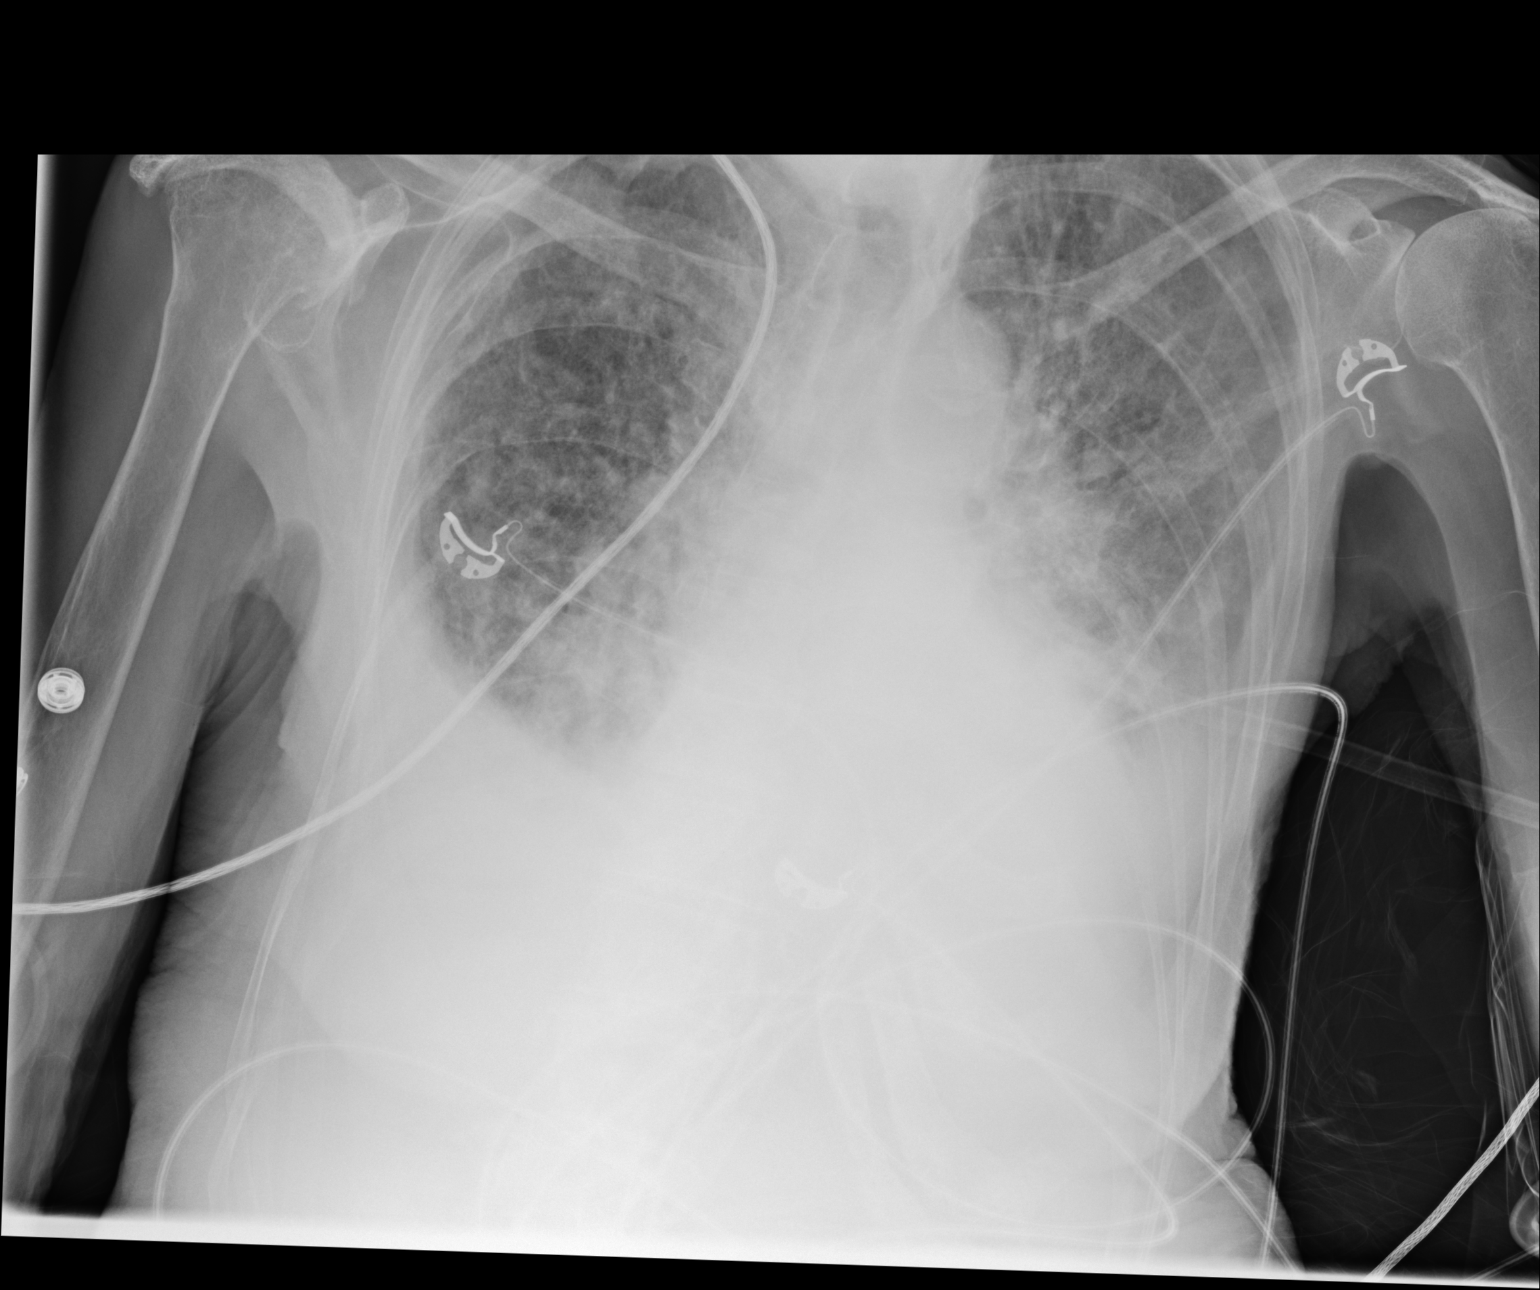
[im 2/2]
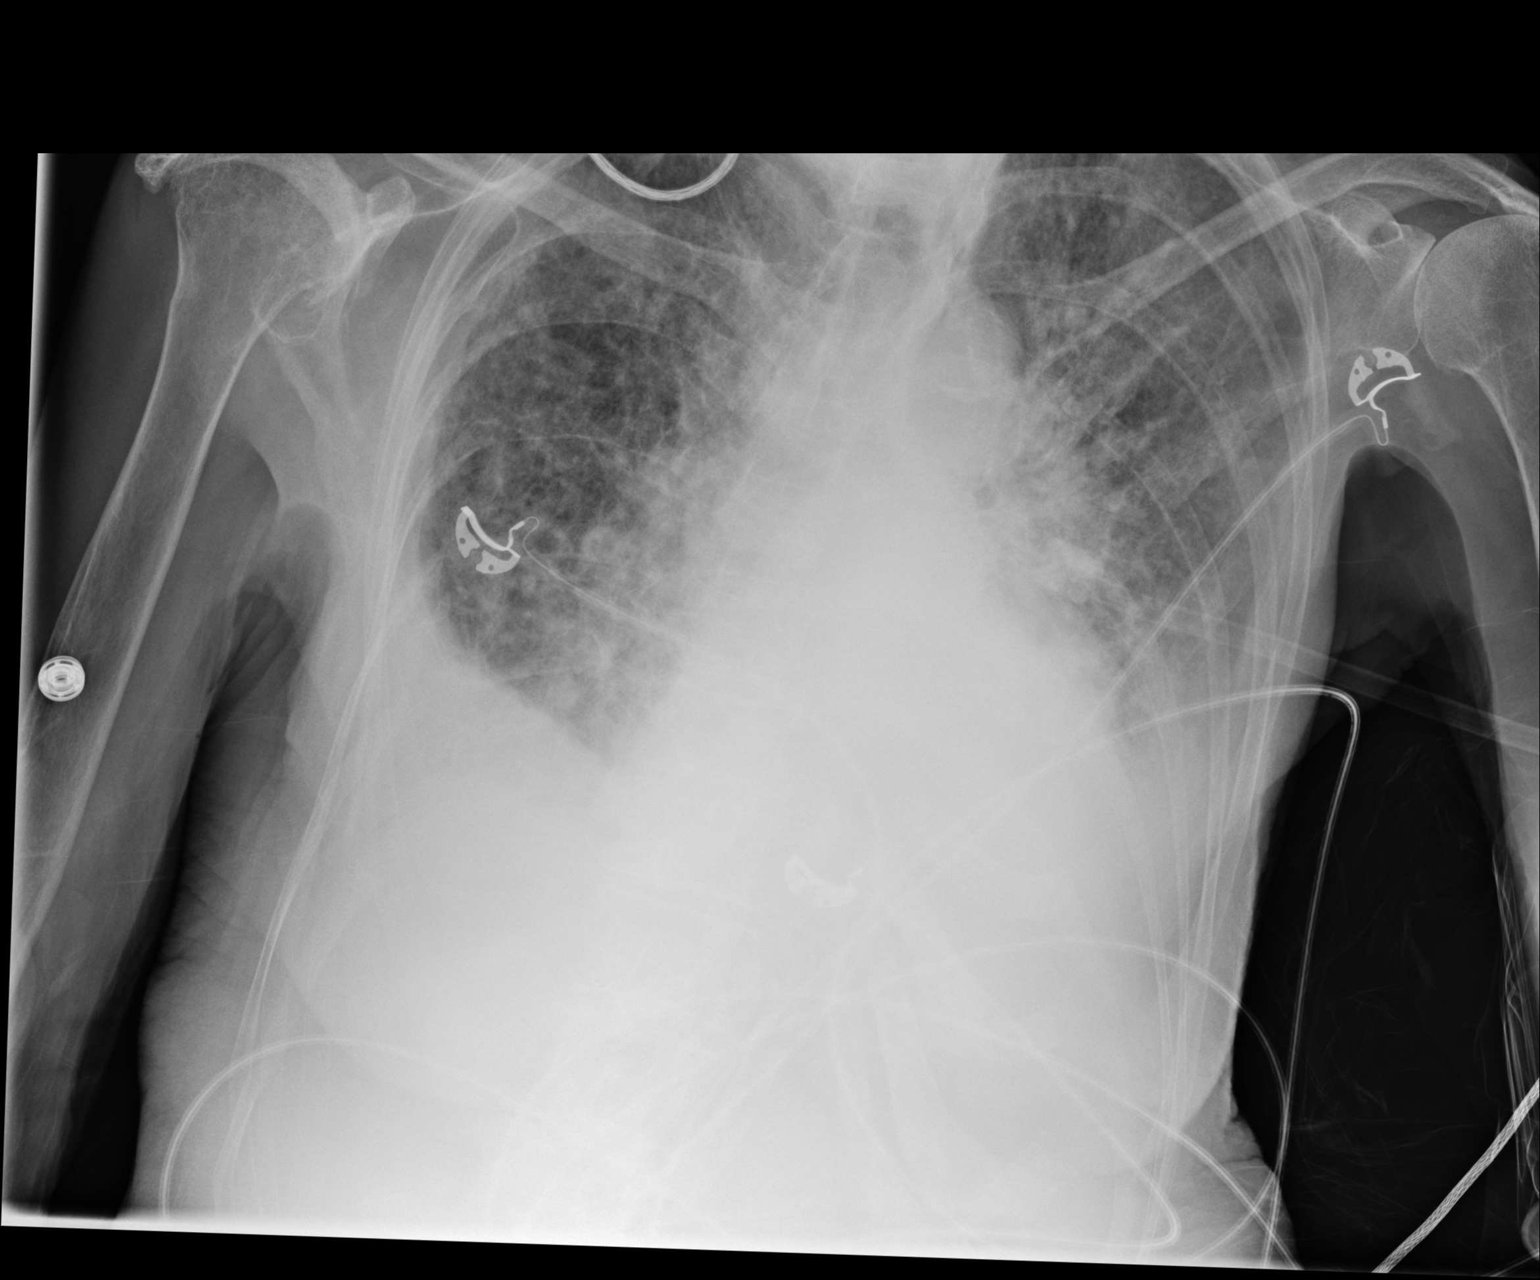

[2 of 2 positions shown; findings below may reference images not displayed]

FINDINGS: Small bibasilar pleural effusions and atelectasis.

Persistent pulmonary edema increased on LEFT since prior exam.

No pneumothorax following RIGHT thoracentesis.

Persistent enlargement of cardiac silhouette with pulmonary vascular
congestion.

Atherosclerotic calcification aorta.

Bones demineralized with BILATERAL chronic rotator cuff tears.
IMPRESSION: No pneumothorax following RIGHT thoracentesis.

Persistent pulmonary edema increased since previous exam with
residual bibasilar effusions and atelectasis.

## 2018-11-11 IMAGING — CR DG CHEST 1V PORT
1 series · 1 of 1 positions shown · non-contrast
Comparison: Radiographs September 16, 2017.

CLINICAL DATA: Bilateral pleural effusions.

EXAM:
PORTABLE CHEST 1 VIEW

[portable]
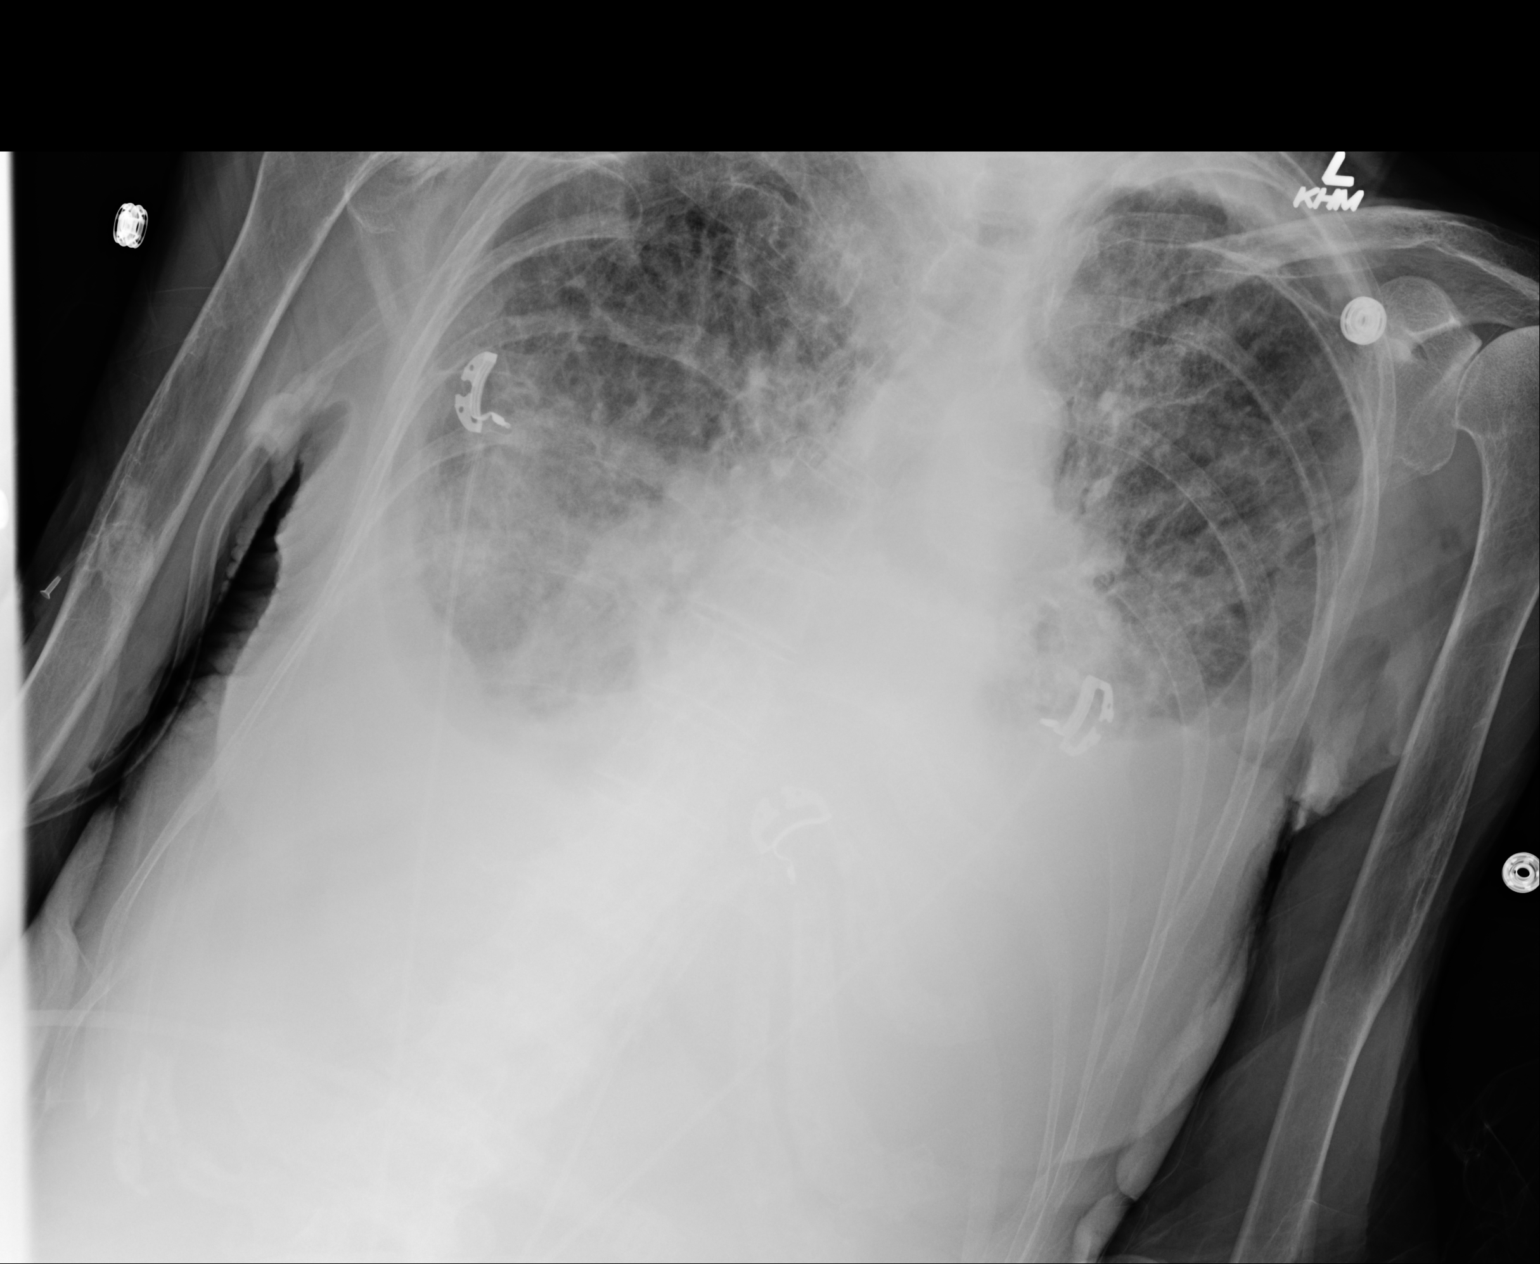

[1 of 1 positions shown; findings below may reference images not displayed]

FINDINGS: Stable cardiomegaly and central pulmonary vascular congestion is
noted. No pneumothorax is noted. Stable diffuse interstitial
densities are noted throughout both lungs concerning for pulmonary
edema or inflammation. Bilateral pleural effusions are noted, with
left greater than right. Probable underlying atelectasis is noted.
Degenerative changes seen involving both glenohumeral joints.
IMPRESSION: Stable cardiomegaly and bilateral pulmonary edema and pleural
effusions is again noted.

## 2018-11-13 IMAGING — DX DG CHEST 2V
2 series · 2 of 2 positions shown · non-contrast
Comparison: Two days ago

CLINICAL DATA: Shortness of breath

EXAM:
CHEST  2 VIEW

[chest ap]
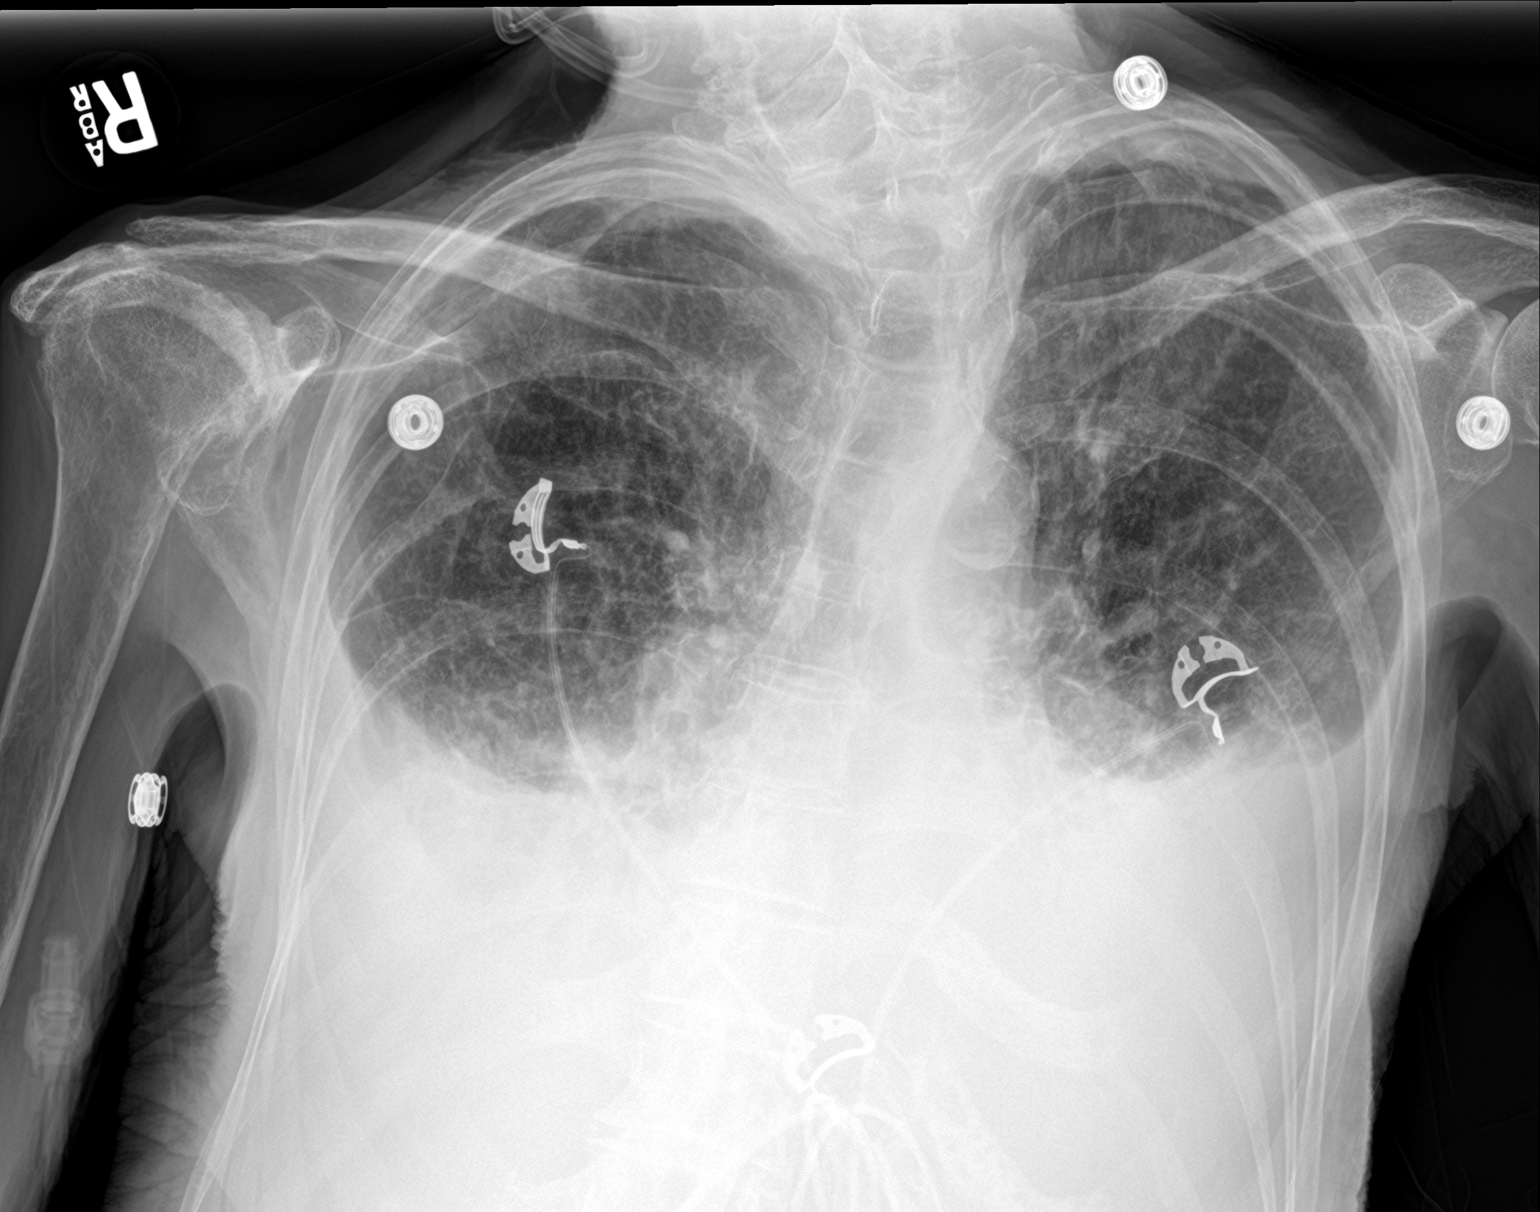

[chest lat]
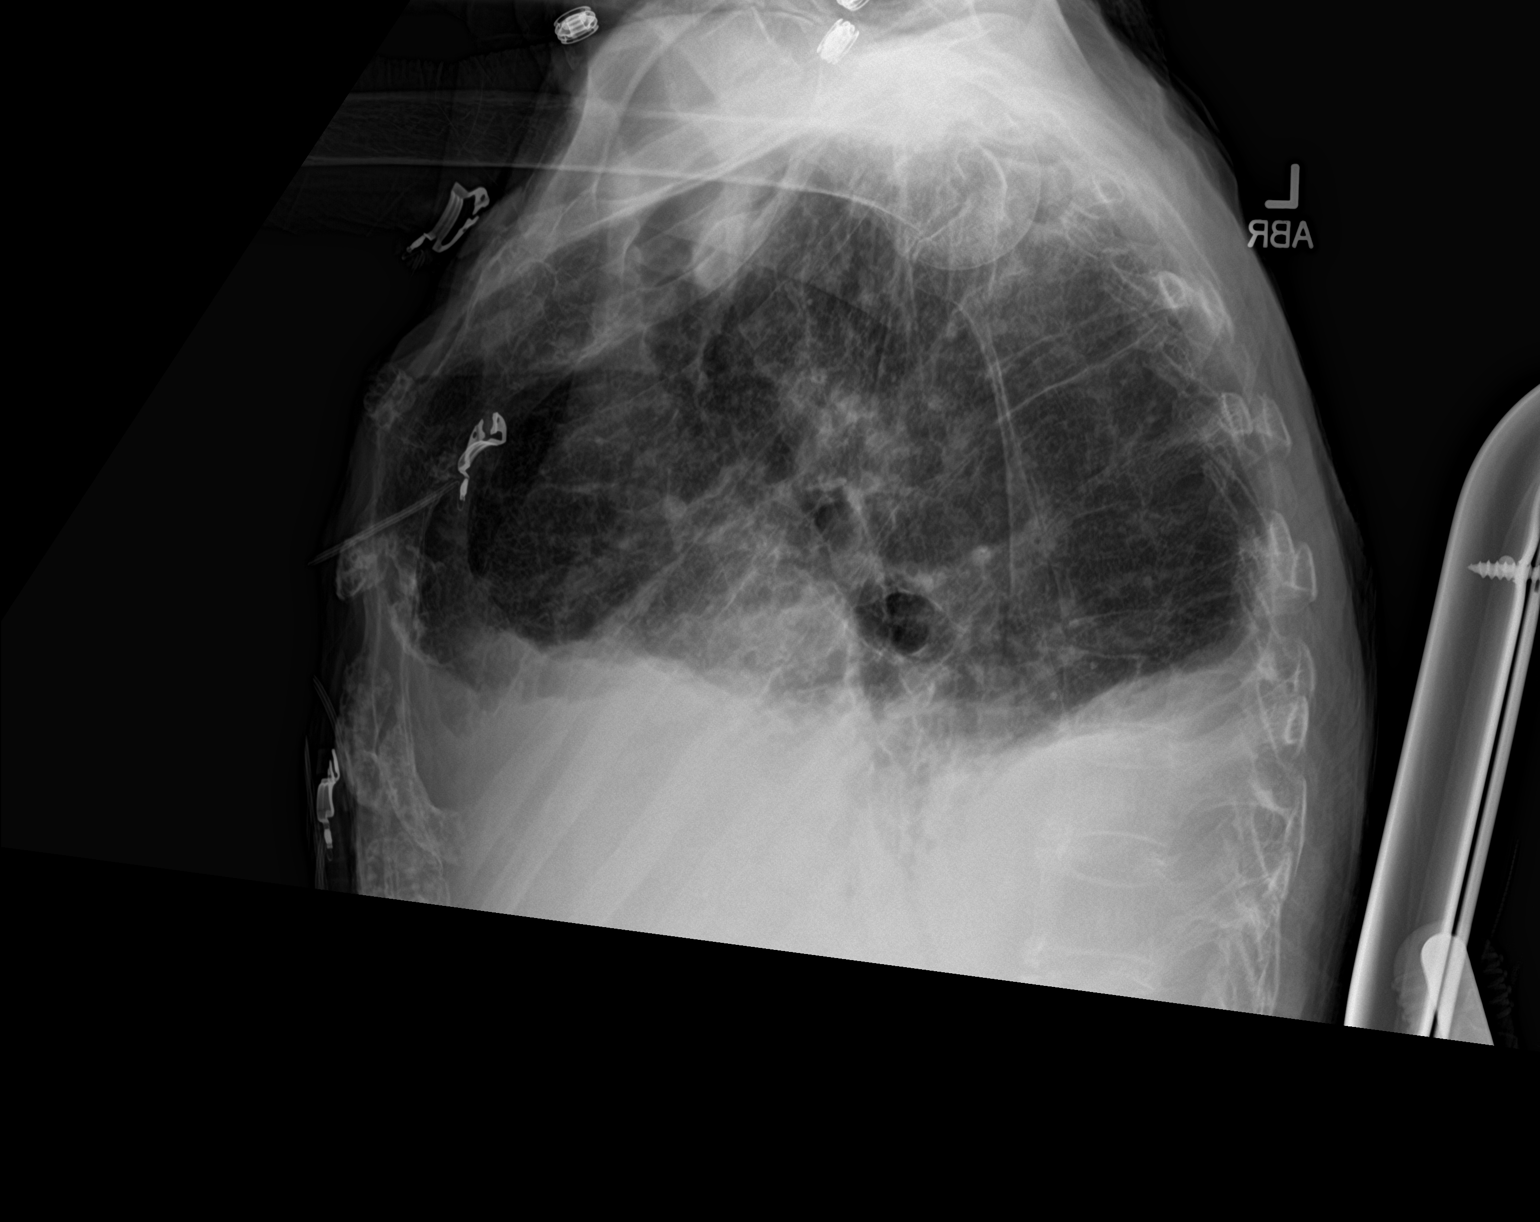

[2 of 2 positions shown; findings below may reference images not displayed]

FINDINGS: Moderate presumably layering pleural effusions. Pulmonary edema seen
previously is improved/resolved. Much of the lower lobes are
obscured, as is the lower heart. Stable visible mediastinal
contours. No air bronchograms. No pneumothorax. Chronic right
rotator cuff tear with high humeral head
IMPRESSION: 1. Moderate bilateral pleural effusion obscuring the lower chest.
2. Pulmonary edema seen 2 days ago is improved/resolved.

## 2018-11-16 DIAGNOSIS — J9611 Chronic respiratory failure with hypoxia: Secondary | ICD-10-CM | POA: Diagnosis not present

## 2018-11-16 DIAGNOSIS — S81801A Unspecified open wound, right lower leg, initial encounter: Secondary | ICD-10-CM | POA: Diagnosis not present

## 2018-11-20 ENCOUNTER — Encounter: Payer: Self-pay | Admitting: Cardiology

## 2018-11-20 ENCOUNTER — Ambulatory Visit: Payer: Medicare Other | Admitting: Cardiology

## 2018-11-20 VITALS — BP 122/70 | HR 95 | Ht 63.0 in | Wt 98.0 lb

## 2018-11-20 DIAGNOSIS — I4819 Other persistent atrial fibrillation: Secondary | ICD-10-CM | POA: Diagnosis not present

## 2018-11-20 DIAGNOSIS — I5032 Chronic diastolic (congestive) heart failure: Secondary | ICD-10-CM

## 2018-11-20 DIAGNOSIS — I10 Essential (primary) hypertension: Secondary | ICD-10-CM | POA: Diagnosis not present

## 2018-11-20 NOTE — Patient Instructions (Signed)
Medication Instructions:  NO CHANGE If you need a refill on your cardiac medications before your next appointment, please call your pharmacy.   Lab work: If you have labs (blood work) drawn today and your tests are completely normal, you will receive your results only by: [name] MyChart Message (if you have MyChart) OR . A paper copy in the mail If you have any lab test that is abnormal or we need to change your treatment, we will call you to review the results.  Follow-Up: At [facility], you and your health needs are our priority.  As part of our continuing mission to provide you with exceptional heart care, we have created designated Provider Care Teams.  These Care Teams include your primary Cardiologist (physician) and Advanced Practice Providers (APPs -  Physician Assistants and Nurse Practitioners) who all work together to provide you with the care you need, when you need it. You will need a follow up appointment in 4 months.  Please call our office 2 months in advance to schedule this appointment.  You may see Kirk Ruths, MD or one of the following Advanced Practice Providers on your designated Care Team:   Kerin Ransom, PA-C Roby Lofts, [place] . Sande Rives, PA-C

## 2018-11-23 ENCOUNTER — Encounter: Payer: Self-pay | Admitting: Physician Assistant

## 2018-11-30 ENCOUNTER — Other Ambulatory Visit: Payer: Self-pay | Admitting: Internal Medicine

## 2018-12-15 DIAGNOSIS — I5032 Chronic diastolic (congestive) heart failure: Secondary | ICD-10-CM | POA: Diagnosis not present

## 2018-12-15 DIAGNOSIS — J449 Chronic obstructive pulmonary disease, unspecified: Secondary | ICD-10-CM | POA: Diagnosis not present

## 2018-12-15 DIAGNOSIS — S81801A Unspecified open wound, right lower leg, initial encounter: Secondary | ICD-10-CM | POA: Diagnosis not present

## 2018-12-15 DIAGNOSIS — R531 Weakness: Secondary | ICD-10-CM | POA: Diagnosis not present

## 2018-12-20 ENCOUNTER — Encounter: Payer: Self-pay | Admitting: Physician Assistant

## 2018-12-20 ENCOUNTER — Other Ambulatory Visit: Payer: Self-pay | Admitting: Internal Medicine

## 2019-01-03 ENCOUNTER — Other Ambulatory Visit: Payer: Self-pay | Admitting: Internal Medicine

## 2019-01-10 DIAGNOSIS — C44729 Squamous cell carcinoma of skin of left lower limb, including hip: Secondary | ICD-10-CM | POA: Diagnosis not present

## 2019-01-15 DIAGNOSIS — R531 Weakness: Secondary | ICD-10-CM | POA: Diagnosis not present

## 2019-01-15 DIAGNOSIS — I5032 Chronic diastolic (congestive) heart failure: Secondary | ICD-10-CM | POA: Diagnosis not present

## 2019-01-15 DIAGNOSIS — J449 Chronic obstructive pulmonary disease, unspecified: Secondary | ICD-10-CM | POA: Diagnosis not present

## 2019-01-15 DIAGNOSIS — S81801A Unspecified open wound, right lower leg, initial encounter: Secondary | ICD-10-CM | POA: Diagnosis not present

## 2019-01-29 ENCOUNTER — Ambulatory Visit (INDEPENDENT_AMBULATORY_CARE_PROVIDER_SITE_OTHER): Payer: Medicare Other | Admitting: Physician Assistant

## 2019-01-29 ENCOUNTER — Encounter: Payer: Self-pay | Admitting: Physician Assistant

## 2019-01-29 ENCOUNTER — Other Ambulatory Visit: Payer: Self-pay

## 2019-01-29 VITALS — BP 96/68 | HR 88 | Temp 97.4°F | Ht 63.0 in | Wt 97.0 lb

## 2019-01-29 DIAGNOSIS — Z8719 Personal history of other diseases of the digestive system: Secondary | ICD-10-CM

## 2019-01-29 DIAGNOSIS — I700 Atherosclerosis of aorta: Secondary | ICD-10-CM

## 2019-01-29 DIAGNOSIS — I2723 Pulmonary hypertension due to lung diseases and hypoxia: Secondary | ICD-10-CM

## 2019-01-29 DIAGNOSIS — I480 Paroxysmal atrial fibrillation: Secondary | ICD-10-CM | POA: Diagnosis not present

## 2019-01-29 DIAGNOSIS — E039 Hypothyroidism, unspecified: Secondary | ICD-10-CM

## 2019-01-29 DIAGNOSIS — R197 Diarrhea, unspecified: Secondary | ICD-10-CM

## 2019-01-29 DIAGNOSIS — R34 Anuria and oliguria: Secondary | ICD-10-CM

## 2019-01-29 DIAGNOSIS — J479 Bronchiectasis, uncomplicated: Secondary | ICD-10-CM

## 2019-01-29 DIAGNOSIS — Z79899 Other long term (current) drug therapy: Secondary | ICD-10-CM | POA: Diagnosis not present

## 2019-01-29 DIAGNOSIS — Z0001 Encounter for general adult medical examination with abnormal findings: Secondary | ICD-10-CM

## 2019-01-29 DIAGNOSIS — Z0000 Encounter for general adult medical examination without abnormal findings: Secondary | ICD-10-CM

## 2019-01-29 DIAGNOSIS — E43 Unspecified severe protein-calorie malnutrition: Secondary | ICD-10-CM

## 2019-01-29 DIAGNOSIS — I471 Supraventricular tachycardia: Secondary | ICD-10-CM | POA: Diagnosis not present

## 2019-01-29 DIAGNOSIS — N183 Chronic kidney disease, stage 3 unspecified: Secondary | ICD-10-CM

## 2019-01-29 DIAGNOSIS — R6889 Other general symptoms and signs: Secondary | ICD-10-CM

## 2019-01-29 DIAGNOSIS — I10 Essential (primary) hypertension: Secondary | ICD-10-CM | POA: Diagnosis not present

## 2019-01-29 DIAGNOSIS — R918 Other nonspecific abnormal finding of lung field: Secondary | ICD-10-CM

## 2019-01-29 DIAGNOSIS — J438 Other emphysema: Secondary | ICD-10-CM

## 2019-01-29 DIAGNOSIS — I5032 Chronic diastolic (congestive) heart failure: Secondary | ICD-10-CM

## 2019-01-29 DIAGNOSIS — C4490 Unspecified malignant neoplasm of skin, unspecified: Secondary | ICD-10-CM

## 2019-01-29 DIAGNOSIS — I4719 Other supraventricular tachycardia: Secondary | ICD-10-CM

## 2019-01-29 DIAGNOSIS — J9611 Chronic respiratory failure with hypoxia: Secondary | ICD-10-CM

## 2019-01-29 DIAGNOSIS — J449 Chronic obstructive pulmonary disease, unspecified: Secondary | ICD-10-CM

## 2019-01-29 NOTE — Progress Notes (Signed)
ACUTE VISIT AND MEDICARE FOLLOW UP  Assessment and Plan: DISCUSSED END OF LIFE GOALS WITH PATIENT AND DAUGHTER, AT THIS TIME SHE STATES SHE DOES NOT WANT TO GO TO [place] BUT ALSO WANTS EVERYTHING DONE FOR HER AND "WANTS TO LIVE" FOR HER DAUGHTERS. DISCUSSED DUE TO HER AGE AND SEVERAL CO MORBIDITIES THAT SHE IS VERY HIGHT RISK FOR DETERIORATION AND POOR PROGNOSIS WILL DO CLOSE FOLLOW UP  Decreased urination -     Urinalysis, Routine w reflex microscopic -     Urine Culture - increase fluids - monitor weight and BP Declines ABX due to Cdiff history, breaking back urine tomorrow  Medication management -     Magnesium  Diarrhea of presumed infectious origin -     Clostridium difficile Toxin B, Qualitative, Real-Time PCR; Future -     Amylase -     Lipase - add on probiotic, no blood, benign AB in the office - Please go to the ER if you have any severe AB pain, unable to hold down food/water, blood in stool or vomit, chest pain, shortness of breath, or any worsening symptoms.    Pulmonary hypertension due to COPD (HCC) Continue oxygen Monitor  Atrial tachycardia (HCC) Increase fluids Monitor  Continues meds Go to the ER if any chest pain, shortness of breath, nausea, dizziness, severe HA, changes vision/speech  AF (paroxysmal atrial fibrillation) (HCC) Increase fluids Monitor  Continues meds Go to the ER if any chest pain, shortness of breath, nausea, dizziness, severe HA, changes vision/speech  Aortic atherosclerosis (HCC) Control blood pressure, cholesterol, glucose, increase exercise.   Chronic diastolic congestive heart failure (HCC) Weight stable/down  Other emphysema (HCC) Continue O2 Lungs decreased BS diffuse, no consolidated wheezing Will get CXR as well rule out accumulation/infection  Chronic respiratory failure with hypoxia (HCC) Continue O2 Lungs decreased BS diffuse, no consolidated wheezing Will get CXR as well rule out accumulation/infection Go  to the ER if any chest pain, shortness of breath, nausea, dizziness, severe HA, changes vision/speech  Bronchiectasis without complication (HCC) Continue O2 Lungs decreased BS diffuse, no consolidated wheezing Will get CXR as well rule out accumulation/infection  CKD (chronic kidney disease) stage 3, GFR 30-59 ml/min (HCC) -     COMPLETE METABOLIC PANEL WITH GFR Increase fluids, continue lasix once daily for now  Protein-calorie malnutrition, severe (HCC) Given boost samples Try to increase calorie foods  Essential hypertension -     CBC with Differential/Platelet -     COMPLETE METABOLIC PANEL WITH GFR -     TSH - continue medications, DASH diet, exercise and monitor at home. Call if greater than 130/80.   Hypothyroidism, unspecified type Hypothyroidism-check TSH level, continue medications the same, reminded to take on an empty stomach 30-62mns before food.    Discussed med's effects and SE's. Screening labs and tests as requested with regular follow-up as recommended. Future Appointments  Date Time Provider DCardiff 02/28/2019  3:00 PM CVicie Mutters PA-C [facility] None  03/21/2019 11:00 AM CLelon Perla MD [facility] 09/18/2019  2:00 PM CLiane Comber NP [facility] None     HPI  85y.o. female  Presents accompanied by her daughter for acute visit for fatigue, decreased urination and weight loss.   She lives with her husband who drives, assisted also by her daughters when needed.   She states she can not get her breath. Daughter is here with her and states she is not drinking but maybe 8 oz a day, she is  not eating much. She is on 1 lasix a day but daughter states she is not peeing much, going 2 x a day.  She has had weakness x 1 month. She has had diarrhea x 1 month intermittently, very watery, last time was 3 days ago.    She fell out of her bed and hit the night stand.   Daughter states that she is not eating and drinking much.   Patient  has COPD & is on nocturnal O2, is on Diamox for CO2 retention - followed by Dr Keturah Barre.  She also has a portable O2 concentrator. She is not coughing, no wheezing.   Patient is also followed by Dr Stanford Breed for AoI, cAfib and chronic diastolic CHF. She is on lasix 40 mg QD.  She denies orthopnea, paroxysmal nocturnal dyspnea and edema. Positive for exertional dyspnea consistent with recent baseline.   BMI is Body mass index is 17.18 kg/m., she is working on diet and exercise. Wt Readings from Last 3 Encounters:  01/29/19 97 lb (44 kg)  11/20/18 98 lb (44.5 kg)  09/14/18 99 lb (44.9 kg)    Her blood pressure has been controlled at home, today their BP is BP: 96/68  She does not workout. She denies chest paindizziness.   CKD 3 is monitored closely and has been stable:  Lab Results  Component Value Date   GFRNONAA 39 (L) 01/29/2019   She is not on cholesterol medication and denies myalgias. Her cholesterol is not at goal. The cholesterol last visit was:   Lab Results  Component Value Date   CHOL 234 (H) 09/14/2018   HDL 80 09/14/2018   LDLCALC 134 (H) 09/14/2018   TRIG 96 09/14/2018   CHOLHDL 2.9 09/14/2018    She has not been working on diet and exercise for prediabetes, and denies paresthesia of the feet, polydipsia, polyuria and visual disturbances. Last A1C in the office was:  Lab Results  Component Value Date   HGBA1C 6.1 (H) 09/14/2018   Patient reports she is not currently on Vitamin D supplement.   Lab Results  Component Value Date   VD25OH 36 09/14/2018     She is on thyroid medication. Her medication was not changed last visit, 1/2 pill except Fri/Sun 1 pill. Lab Results  Component Value Date   TSH 3.23 01/29/2019     Current Medications:  Current Outpatient Medications on File Prior to Visit  Medication Sig Dispense Refill  . acetaZOLAMIDE (DIAMOX) 250 MG tablet TAKE ONE TABLET (250MG TOTAL) BY MOUTH DAILY 30 tablet 1  . ALPRAZolam (XANAX) 0.5 MG tablet  TAKE ONE-HALF TO ONE TABLET 2 TO 3 TIMESDAILY FOR NERVES OR SLEEP MAYCAUSE DROWSINESS 90 tablet 0  . apixaban (ELIQUIS) 2.5 MG TABS tablet Take 1 tablet (2.5 mg total) by mouth 2 (two) times daily. 180 tablet 3  . Cholecalciferol 50 MCG (2000 UT) CAPS Take 1 capsule (2,000 Units total) by mouth daily. 30 each   . furosemide (LASIX) 40 MG tablet TAKE ONE TABLET (40MG TOTAL) BY MOUTH TWICE DAILY 180 tablet 1  . ipratropium-albuterol (DUONEB) 0.5-2.5 (3) MG/3ML SOLN Take 3 mLs by nebulization QID. 360 mL 11  . levothyroxine (SYNTHROID, LEVOTHROID) 50 MCG tablet TAKE ONE TABLET BY MOUTH ONCE A DAY AS DIRECTED 90 tablet 1  . metoprolol tartrate (LOPRESSOR) 50 MG tablet TAKE ONE TABLET (50MG TOTAL) BY MOUTH TWICE DAILY 180 tablet 1  . potassium chloride (K-DUR) 10 MEQ tablet Take 1 tablet (10 mEq total) by mouth  daily. 30 tablet 2  . Probiotic Product (PROBIOTIC-10) CHEW Chew 2 tablets by mouth daily.    [name] Respiratory Therapy Supplies (FLUTTER) DEVI Use as directed. 1 each 0  . diltiazem (CARDIZEM CD) 240 MG 24 hr capsule Take 1 capsule (240 mg total) by mouth daily. 90 capsule 3   No current facility-administered medications on file prior to visit.     Medical History:  Past Medical History:  Diagnosis Date  . Aortic regurgitation   . Bronchiectasis ([place])   . COPD (chronic obstructive pulmonary disease) ([place])   . DJD (degenerative joint disease)   . HLD (hyperlipidemia)   . HTN (hypertension)   . Hypothyroidism   . Palpitations    a. has known history of PAC's and PVC's. b. 09/2015: monitor showed sinus rhythm with occasional PAC's and a brief episode of PAT.  . S/P thoracentesis    Allergies Allergies  Allergen Reactions  . Vancomycin     Worsening kidney function  . Sulfa Antibiotics Palpitations    Unknown    SURGICAL HISTORY She  has a past surgical history that includes Abdominal hysterectomy (1993); Cataract extraction, bilateral; and Wound debridement (Right,  06/29/2017). FAMILY HISTORY Her family history includes Asthma in her brother; Cancer in her father; Diabetes in her brother, mother, and sister; Emphysema in her mother, sister, and sister; Heart attack in her sister; Rheumatic fever in her sister. SOCIAL HISTORY She  reports that she quit smoking about 44 years ago. Her smoking use included cigarettes. She has a 20.00 pack-year smoking history. She has never used smokeless tobacco. She reports that she does not drink alcohol or use drugs.   MEDICARE WELLNESS OBJECTIVES: Physical activity: Current Exercise Habits: The patient does not participate in regular exercise at present, Exercise limited by: respiratory conditions(s) Cardiac risk factors: Cardiac Risk Factors include: advanced age (>67mn, >>64women);dyslipidemia;hypertension;sedentary lifestyle Depression/mood screen:   [facility] 01/30/[facility]  Decreased Interest 0  Down, Depressed, Hopeless 0  PHQ - 2 Score 0  Some recent data might be hidden    ADLs:  In your present state of health, do you have any difficulty performing the following activities: 01/30/2019 09/14/2018  Hearing? Y N  Vision? N N  Difficulty concentrating or making decisions? N N  Walking or climbing stairs? Y Y  Comment - respiratory failure  Dressing or bathing? Y N  Doing errands, shopping? Y Y  Comment - driven by family   PConservation officer, natureand eating ? Y N  Using the Toilet? Y N  In the past six months, have you accidently leaked urine? Y N  Do you have problems with loss of bowel control? N N  Managing your Medications? Y N  Managing your Finances? Y Y  Comment - family helps  Housekeeping or managing your Housekeeping? Y Y  Comment - family helps  Some recent data might be hidden    EOL planning: Does Patient Have a Medical Advance Directive?: No Would patient like information on creating a medical advance directive?: No - Patient declined   Review of Systems  Constitutional: Positive  for malaise/fatigue. Negative for chills and fever.  HENT: Positive for hearing loss. Negative for congestion, ear discharge and sore throat.   Respiratory: Positive for shortness of breath. Negative for cough and wheezing.   Cardiovascular: Negative for chest pain, palpitations and leg swelling.  Gastrointestinal: Positive for diarrhea. Negative for blood in stool, constipation and melena.  Genitourinary: Negative.   Skin: Negative.  Neurological: Positive for weakness. Negative for dizziness, tingling, tremors, sensory change, speech change, focal weakness, seizures, loss of consciousness and headaches.  Psychiatric/Behavioral: Negative for depression. The patient is not nervous/anxious and does not have insomnia.      Physical Exam: Estimated body mass index is 17.18 kg/m as calculated from the following:   Height as of this encounter: _0  (1.6 m).   Weight as of this encounter: 97 lb (44 kg). BP 96/68   Pulse 88   Temp (!) 97.4 F (36.3 C)   Ht _1  (1.6 m)   Wt 97 lb (44 kg)   SpO2 90%   BMI 17.18 kg/m  General Appearance: Malnourished/underweight in no apparent distress.  Eyes: PERRLA, EOMs, conjunctiva no swelling or erythema, normal fundi and vessels.  Sinuses: No Frontal/maxillary tenderness  ENT/Mouth: Ext aud canals clear,  normal light reflex with TMs without erythema, bulging. No erythema, swelling, or exudate on post pharynx. Tonsils not swollen or erythematous. Hearing decreased Neck: Supple, thyroid normal. No bruits  Respiratory: Respiratory effort normal, decreased breathsounds DIFFUSELY, BS equal bilaterally without rales, rhonchi, wheezing or stridor. [place] IN PLACE Cardio: RRR with holosystolic 2/6, prominent S2. Brisk peripheral pulses with MILD EDEMA Chest: symmetric, with normal excursions and percussion.   Abdomen: Soft, nontender, no guarding, rebound, hernias, masses, or organomegaly. .  Lymphatics: Non tender without lymphadenopathy.  Musculoskeletal:  Full ROM all peripheral extremities,4/5 strength,IN WHEELCHAIR WITH [place] FOR O2 Skin: Warm, dry without rashes, lesions, ecchymosis. Neuro: Cranial nerves intact, reflexes equal bilaterally. DECREASED muscle tone AND BULK, no cerebellar symptoms. [name]  Psych: Awake and oriented X 3, normal affect, Insight and Judgment appropriate.    Medicare Attestation I have personally reviewed: The patient's medical and social history Their use of alcohol, tobacco or illicit drugs Their current medications and supplements The patient's functional ability including ADLs,fall risks, home safety risks, cognitive, and hearing and visual impairment Diet and physical activities Evidence for depression or mood disorders  The patient's weight, height, BMI, and visual acuity have been recorded in the chart.  I have made referrals, counseling, and provided education to the patient based on review of the above and I have provided the patient with a written personalized care plan for preventive services.     Vicie Mutters 12:43 PM [facility]

## 2019-01-29 NOTE — Patient Instructions (Addendum)
You need to drink and eat food Add ensure  WEIGHT GAIN  Try to make sure you are eating plenty of high calorie dense foods like: Avocado Nuts Peanut butter Oatmeal Dates Cottage cheese Greek yogurt Protein powder  Go to the ER if any chest pain, shortness of breath, nausea, dizziness, severe HA, changes vision/speech  Dehydration  Dehydration is a condition in which there is not enough fluid or water in the body. This happens when you lose more fluids than you take in. Important organs, such as the kidneys, brain, and heart, cannot function without a proper amount of fluids. Any loss of fluids from the body can lead to dehydration. People age 7 or older have a higher risk of dehydration than younger adults because in older age, the body:  Is less able to conserve water.  Does not respond to temperature changes as well.  Does not get thirsty as easily or quickly. Dehydration can range from mild to severe. This condition should be treated right away to prevent it from becoming severe. What are the causes? Dehydration may be caused by:  Vomiting.  Diarrhea.  Excessive sweating, such as from heat exposure or exercise.  Not drinking enough fluid, especially: ? When ill. ? While doing activity that requires a lot of energy.  Excessive urination.  Fever.  Infection.  Certain medicines, such as medicines that cause the body to lose excess fluid (diuretics).  Inability to access safe drinking water.  Poorly controlled blood sugars.  Reduced physical ability to get adequate water and food. What increases the risk? This condition is more likely to develop in people who:  Have a long-term (chronic) illness, such as: ? An illness that may increase urination, such as diabetes. ? Kidney, heart, or lung disease. ? Neurological or psychological disorders, such as dementia.  Are age 70 or older.  Are disabled.  Live in a place with high altitude. What are the signs  or symptoms? Symptoms of mild dehydration may include:  Thirst.  Dry lips.  Slightly dry mouth.  Dry, warm skin.  Dizziness. Symptoms of moderate dehydration may include:  Very dry mouth.  Muscle cramps.  Dark urine. Urine may be the color of tea.  Decreased urine production.  Decreased tear production.  Heartbeat that is irregular or faster than normal (palpitations).  Headache.  Light-headedness, especially when you stand up from a sitting position.  Fainting (syncope). Symptoms of severe dehydration may include:  Changes in skin, such as: ? Cold and clammy skin. ? Blotchy (mottled) or pale skin. ? Skin that does not quickly return to normal after being lightly pinched and released (poor skin turgor).  Changes in body fluids, such as: ? Extreme thirst. ? No tear production. ? Inability to sweat when body temperature is high, such as in hot weather. ? Very little urine production.  Changes in vital signs, such as: ? Weak pulse. ? Pulse that is more than 100 beats a minute when sitting still. ? Rapid breathing. ? Low blood pressure.  Other changes, such as: ? Sunken eyes. ? Cold hands and feet. ? Confusion. ? Lack of energy (lethargy). ? Difficulty waking up from sleep. ? Short-term weight loss. ? Unconsciousness. How is this diagnosed? This condition is diagnosed based on your symptoms and a physical exam. Blood and urine tests may be done to help confirm the diagnosis. How is this treated? Treatment for this condition depends on the severity. Mild or moderate dehydration can often be treated at home.  Treatment should be started right away. Do not wait until dehydration becomes severe. Severe dehydration is an emergency and it needs to be treated in a hospital. Treatment for mild dehydration may include:  Drinking more fluids.  Replacing salts and minerals in your blood (electrolytes). Treatment for moderate dehydration may include:  Drinking an  oral rehydration solution (ORS). This is a drink that helps you replace fluids and electrolytes (rehydrate). It is found at pharmacies and retail stores. Treatment for severe dehydration may include:  Receiving fluids through an IV tube.  Receiving an electrolyte solution through a tube passed through your nose and into your stomach (nasogastric tube or NG tube).  Correcting abnormalities in electrolytes.  Treating the underlying cause of dehydration. Follow these instructions at home:  If directed by your health care provider, drink an ORS: ? Make an ORS by following instructions on the package. ? Start by drinking small amounts, about  cup (120 mL) every 5-10 minutes. ? Slowly increase how much you drink until you have taken the amount recommended by your health care provider.  Drink enough clear fluid to keep your urine clear or pale yellow. If you were told to drink an ORS, finish the ORS first, then start slowly drinking other clear fluids. Drink fluids such as: ? Water. Do not drink only water. Doing that can lead to having too little salt (sodium) in the body (hyponatremia). ? Ice chips. ? Fruit juice that you have added water to (diluted fruit juice). ? Low-calorie sports drinks.  Avoid: ? Alcohol. ? Drinks that contain a lot of sugar. These include high-calorie sports drinks, fruit juice that is not diluted, and soda. ? Caffeine. ? Foods that are greasy or contain a lot of fat or sugar.   Take over-the-counter and prescription medicines only as told by your health care provider.  Do not take sodium tablets. This can lead to having too much sodium in the body (hypernatremia).  Eat foods that contain a healthy balance of electrolytes, such as bananas, oranges, potatoes, tomatoes, and spinach.  Keep all follow-up visits as told by your health care provider. This is important. Contact a health care provider if:  You have abdominal pain that: ? Gets worse. ? Stays in one  area (localizes).  You have a rash.  You have a stiff neck.  You are sleepier, more irritable, or more difficult to wake up than usual.  You feel weak, dizzy, or very thirsty. Get help right away if:  You have: ? Symptoms of severe dehydration. ? A fever. ? A severe headache. ? Vomiting or diarrhea that gets worse or does not go away. ? Diarrhea for more than 24 hours. ? Blood or green matter (bile) in your vomit. ? Blood in your stool. This may cause stool to look black and tarry. ? Trouble breathing.  You cannot drink fluids without vomiting.  Your symptoms get worse with treatment.  You have not urinated in 6-8 hours.  You have urinated only a small amount of very dark urine over 6-8 hours.  You faint.  Your heart rate while sitting still is over 100 beats a minute. This information is not intended to replace advice given to you by your health care provider. Make sure you discuss any questions you have with your health care provider. Document Released: 12/04/2003 Document Revised: 04/02/2016 Document Reviewed: 11/07/2015 Elsevier Interactive Patient Education  2019 [employer].

## 2019-01-30 ENCOUNTER — Encounter: Payer: Self-pay | Admitting: Physician Assistant

## 2019-01-30 DIAGNOSIS — R34 Anuria and oliguria: Secondary | ICD-10-CM | POA: Diagnosis not present

## 2019-01-30 LAB — COMPLETE METABOLIC PANEL WITH GFR
AG Ratio: 1.1 (calc) (ref 1.0–2.5)
ALT: 9 U/L (ref 6–29)
AST: 19 U/L (ref 10–35)
Albumin: 3.6 g/dL (ref 3.6–5.1)
Alkaline phosphatase (APISO): 65 U/L (ref 37–153)
BUN/Creatinine Ratio: 34 (calc) — ABNORMAL HIGH (ref 6–22)
BUN: 42 mg/dL — ABNORMAL HIGH (ref 7–25)
CO2: 34 mmol/L — ABNORMAL HIGH (ref 20–32)
Calcium: 8.8 mg/dL (ref 8.6–10.4)
Chloride: 101 mmol/L (ref 98–110)
Creat: 1.24 mg/dL — ABNORMAL HIGH (ref 0.60–0.88)
GFR, Est African American: 45 mL/min/{1.73_m2} — ABNORMAL LOW (ref >=60)
GFR, Est Non African American: 39 mL/min/{1.73_m2} — ABNORMAL LOW (ref >=60)
Globulin: 3.2 g/dL (calc) (ref 1.9–3.7)
Glucose, Bld: 120 mg/dL — ABNORMAL HIGH (ref 65–99)
Potassium: 3.7 mmol/L (ref 3.5–5.3)
Sodium: 141 mmol/L (ref 135–146)
Total Bilirubin: 0.5 mg/dL (ref 0.2–1.2)
Total Protein: 6.8 g/dL (ref 6.1–8.1)

## 2019-01-30 LAB — CBC WITH DIFFERENTIAL/PLATELET
Absolute Monocytes: 731 cells/uL (ref 200–950)
Basophils Absolute: 17 cells/uL (ref 0–200)
Basophils Relative: 0.2 %
Eosinophils Absolute: 50 cells/uL (ref 15–500)
Eosinophils Relative: 0.6 %
HCT: 36.1 % (ref 35.0–45.0)
Hemoglobin: 11.6 g/dL — ABNORMAL LOW (ref 11.7–15.5)
Lymphs Abs: 1016 cells/uL (ref 850–3900)
MCH: 31 pg (ref 27.0–33.0)
MCHC: 32.1 g/dL (ref 32.0–36.0)
MCV: 96.5 fL (ref 80.0–100.0)
MPV: 13.1 fL — ABNORMAL HIGH (ref 7.5–12.5)
Monocytes Relative: 8.7 %
Neutro Abs: 6586 cells/uL (ref 1500–7800)
Neutrophils Relative %: 78.4 %
Platelets: 186 10*3/uL (ref 140–400)
RBC: 3.74 10*6/uL — ABNORMAL LOW (ref 3.80–5.10)
RDW: 11.5 % (ref 11.0–15.0)
Total Lymphocyte: 12.1 %
WBC: 8.4 10*3/uL (ref 3.8–10.8)

## 2019-01-30 LAB — TSH: TSH: 3.23 mIU/L (ref 0.40–4.50)

## 2019-01-30 LAB — AMYLASE: Amylase: 52 U/L (ref 21–101)

## 2019-01-30 LAB — MAGNESIUM: Magnesium: 2 mg/dL (ref 1.5–2.5)

## 2019-01-30 LAB — LIPASE: Lipase: 24 U/L (ref 7–60)

## 2019-01-31 ENCOUNTER — Ambulatory Visit
Admission: RE | Admit: 2019-01-31 | Discharge: 2019-01-31 | Disposition: A | Discharge status: Home / Self Care | Payer: Medicare Other | Source: Ambulatory Visit | Attending: Physician Assistant | Admitting: Physician Assistant

## 2019-01-31 ENCOUNTER — Other Ambulatory Visit: Payer: Self-pay

## 2019-01-31 DIAGNOSIS — R0602 Shortness of breath: Secondary | ICD-10-CM | POA: Diagnosis not present

## 2019-01-31 DIAGNOSIS — J9611 Chronic respiratory failure with hypoxia: Secondary | ICD-10-CM

## 2019-01-31 DIAGNOSIS — I5032 Chronic diastolic (congestive) heart failure: Secondary | ICD-10-CM

## 2019-02-01 ENCOUNTER — Other Ambulatory Visit: Payer: Self-pay | Admitting: Physician Assistant

## 2019-02-01 MED ORDER — CEFUROXIME AXETIL 250 MG PO TABS: 250.0000 mg | ORAL_TABLET | Freq: Two times a day (BID) | ORAL | 0 refills | Status: AC

## 2019-02-02 LAB — URINALYSIS, ROUTINE W REFLEX MICROSCOPIC
Bilirubin Urine: NEGATIVE
Glucose, UA: NEGATIVE
Hgb urine dipstick: NEGATIVE
Ketones, ur: NEGATIVE
Nitrite: NEGATIVE
Specific Gravity, Urine: 1.016 (ref 1.001–1.03)
pH: 5.5 (ref 5.0–8.0)

## 2019-02-02 LAB — URINE CULTURE
MICRO NUMBER:: 447035
SPECIMEN QUALITY:: ADEQUATE

## 2019-02-02 LAB — MICROSCOPIC MESSAGE

## 2019-02-13 ENCOUNTER — Ambulatory Visit (INDEPENDENT_AMBULATORY_CARE_PROVIDER_SITE_OTHER): Payer: Medicare Other | Admitting: Adult Health Nurse Practitioner

## 2019-02-13 ENCOUNTER — Other Ambulatory Visit: Payer: Self-pay

## 2019-02-13 VITALS — BP 138/64 | HR 92 | Temp 97.9°F | Wt 99.8 lb

## 2019-02-13 DIAGNOSIS — I5032 Chronic diastolic (congestive) heart failure: Secondary | ICD-10-CM

## 2019-02-13 DIAGNOSIS — N183 Chronic kidney disease, stage 3 unspecified: Secondary | ICD-10-CM

## 2019-02-13 DIAGNOSIS — I480 Paroxysmal atrial fibrillation: Secondary | ICD-10-CM

## 2019-02-13 DIAGNOSIS — Z681 Body mass index (BMI) 19 or less, adult: Secondary | ICD-10-CM

## 2019-02-13 DIAGNOSIS — R600 Localized edema: Secondary | ICD-10-CM

## 2019-02-13 DIAGNOSIS — J9611 Chronic respiratory failure with hypoxia: Secondary | ICD-10-CM | POA: Diagnosis not present

## 2019-02-13 DIAGNOSIS — E440 Moderate protein-calorie malnutrition: Secondary | ICD-10-CM

## 2019-02-13 DIAGNOSIS — S81802A Unspecified open wound, left lower leg, initial encounter: Secondary | ICD-10-CM

## 2019-02-13 NOTE — Progress Notes (Signed)
Assessment and Plan:  Diagnoses and all orders for this visit:  Bilateral lower extremity edema Discussed elevating legs and provided patient with printed educational material Concern she is not taking her lasix but reports she doing so. -     Ambulatory referral to Home Health  Chronic respiratory failure with hypoxia ([place]) Uses oxygen at home, did not bring with her today. -     Ambulatory referral to Home Health  Chronic diastolic congestive heart failure (HCC) Weights self daily Discussed low sodium diet Increase water intake, reports may 8oz a day -     Ambulatory referral to [place]  AF (paroxysmal atrial fibrillation) ([place]) Concern for medication adhereance  Asymptomatic today -     Ambulatory referral to [place]  CKD (chronic kidney disease) stage 3, GFR 30-59 ml/min (HCC) Increase fluids, avoid NSAIDS, monitor sugars, will monitor -     Ambulatory referral to [place] left lower extremity Delayed healing related to edema -Home health assessment of this for treatment Patient has stopped prescribed ointment by dermatologist after biopsy.  No care after Appears non-infectious at this time  BMI less than 19 Moderate protein-Calorie malnutrition ([place]) Concern for nutrition in terms of cognition Concerned she may not remember to eat? Discussed drinking fluids during the day Reports eating frozen dinners. Needs supplementation between meals, Ensure, breeze? Will discuss with family further at end of week.   Patient agreed for office to contact dauhter to relay information and instructions from appointment.  Written instructions also given.  Will follow up via phone with daughter on Friday while coordinating [place].    Further disposition pending results of labs. Discussed med's effects and SE's.   Over 30 minutes of exam, counseling, chart review, and critical decision making was performed.   Future Appointments  Date Time  Provider [place]  02/28/2019  3:00 PM Vicie Mutters, PA-C [facility] None  03/21/2019 11:00 AM Lelon Perla, MD [facility]  09/18/2019  2:00 PM Liane Comber, NP [facility] None    ------------------------------------------------------------------------------------------------------------------   HPI 85 y.o.female presents for edema to BLE that start 3-4 days ago.  She saw Dr Ubaldo Glassing 3 weeks ago, had some acatinic keratosis removed. She is not confident in her answer or able to recall the exact appointment date.  She reports the biopsy area has healed.  She is not doing any extra care for this.  She did receive some ointment to apply to the area.  She also reports that her legs are numb. She can tell they are there and move them but she stubbed her toe and she had no feeling or pain with this.  Reports she has wide shoes so if she has swelling she will have secure foot ware.  Reports her daughters are involved in her care.  She reports she did not tell them about this appointment today.  She reports during the day she sits in her chair with her legs on the floor.  She does not wear TED/compression stockings.  Reports she is not able to tolerate this or any tight clothing.  She is alert and oriented, HOH.  She appears thin and frail.  She is a poor historian and is alone at the appointment today.  Her husband drove her.  She ambulates with a cane and becomes out of breath with walking for long distances.  She needed to stop and rest on the way into the office for the appointment.  There is significant concern  rather the instructions of the visit will be carried out.  With must discussion patient is agreeable for [place] to contact her daughter [name] to relay the treatment plan from the appointment today. Concern for some self care, medication management, and ability to relay outcome of appointment today.  Past Medical History:  Diagnosis Date  . Aortic regurgitation   .  Bronchiectasis ([place])   . COPD (chronic obstructive pulmonary disease) ([place])   . DJD (degenerative joint disease)   . HLD (hyperlipidemia)   . HTN (hypertension)   . Hypothyroidism   . Palpitations    a. has known history of PAC's and PVC's. b. 09/2015: monitor showed sinus rhythm with occasional PAC's and a brief episode of PAT.  . S/P thoracentesis      Allergies  Allergen Reactions  . Vancomycin     Worsening kidney function  . Sulfa Antibiotics Palpitations    Unknown    Current Outpatient Medications on File Prior to Visit  Medication Sig  . acetaZOLAMIDE (DIAMOX) 250 MG tablet TAKE ONE TABLET (250MG TOTAL) BY MOUTH DAILY  . ALPRAZolam (XANAX) 0.5 MG tablet TAKE ONE-HALF TO ONE TABLET 2 TO 3 TIMESDAILY FOR NERVES OR SLEEP MAYCAUSE DROWSINESS  . apixaban (ELIQUIS) 2.5 MG TABS tablet Take 1 tablet (2.5 mg total) by mouth 2 (two) times daily.  . cefUROXime (CEFTIN) 250 MG tablet Take 1 tablet (250 mg total) by mouth 2 (two) times daily.  . Cholecalciferol 50 MCG (2000 UT) CAPS Take 1 capsule (2,000 Units total) by mouth daily.  [name] diltiazem (CARDIZEM CD) 240 MG 24 hr capsule Take 1 capsule (240 mg total) by mouth daily.  . furosemide (LASIX) 40 MG tablet TAKE ONE TABLET (40MG TOTAL) BY MOUTH TWICE DAILY  . ipratropium-albuterol (DUONEB) 0.5-2.5 (3) MG/3ML SOLN Take 3 mLs by nebulization QID.  [name] levothyroxine (SYNTHROID, LEVOTHROID) 50 MCG tablet TAKE ONE TABLET BY MOUTH ONCE A DAY AS DIRECTED  . metoprolol tartrate (LOPRESSOR) 50 MG tablet TAKE ONE TABLET (50MG TOTAL) BY MOUTH TWICE DAILY  . potassium chloride (K-DUR) 10 MEQ tablet Take 1 tablet (10 mEq total) by mouth daily.  . Probiotic Product (PROBIOTIC-10) CHEW Chew 2 tablets by mouth daily.  [name] Respiratory Therapy Supplies (FLUTTER) DEVI Use as directed.   No current facility-administered medications on file prior to visit.     ROS: all negative except above.   Physical Exam:  BP 138/64   Pulse 92   Temp 97.9 F (36.6  C)   Wt 99 lb 12.8 oz (45.3 kg)   SpO2 91%   BMI 17.68 kg/m   General Appearance: Thin and frail, in no apparent distress. Eyes: PERRLA, EOMs, conjunctiva no swelling or erythema Sinuses: No Frontal/maxillary tenderness ENT/Mouth: Ext aud canals clear, TMs without erythema, bulging. No erythema, swelling, or exudate on post pharynx.  Tonsils not swollen or erythematous. HOH  Neck: Supple, thyroid normal.  Respiratory: Respiratory effort normal, BS equal bilaterally without rales, rhonchi, wheezing or stridor.  Cardio: RRR with no MRGs. Brisk peripheral pulses without edema.  Abdomen: Soft, + BS.  Non tender, no guarding, rebound, hernias, masses. Lymphatics: Non tender without lymphadenopathy.  Musculoskeletal: Full ROM, 5/5 strength, normal gait.  Skin: Warm, very dry with flaking skin to upper and lower extremities. Niclkel size wound to LLE, white wound bed, clear serous noted from wound, non red, skin blanchable. No further  rashes, lesions, ecchymosis.  Neuro: Cranial nerves intact. Normal muscle tone, no cerebellar symptoms. Sensation intact.  Psych: Awake and  oriented X 3, normal affect, Insight and Judgment appropriate.     [name], NP 12:17 PM [facility]

## 2019-02-13 NOTE — Patient Instructions (Addendum)
You NEED TO ELEVATE YOUR LEGS ABOVE YOUR HEART WHEN YOU ARE SITTING.  Take your Lasix 24m twice a day for one week.  You must take this twice a day!  Increase the amount of water you drink  Decrease the amount of salt in your diet!  Or use No Salt alternative.  Please check your feet everyday to make sure you do not have any cuts.  Increase activity, walking with cane for safety.     DASH Eating Plan DASH stands for "Dietary Approaches to Stop Hypertension." The DASH eating plan is a healthy eating plan that has been shown to reduce high blood pressure (hypertension). It may also reduce your risk for type 2 diabetes, heart disease, and stroke. The DASH eating plan may also help with weight loss. What are tips for following this plan?  General guidelines  Avoid eating more than 2,300 mg (milligrams) of salt (sodium) a day. If you have hypertension, you may need to reduce your sodium intake to 1,500 mg a day.  Limit alcohol intake to no more than 1 drink a day for nonpregnant women and 2 drinks a day for men. One drink equals 12 oz of beer, 5 oz of wine, or 1 oz of hard liquor.  Work with your health care provider to maintain a healthy body weight or to lose weight. Ask what an ideal weight is for you.  Get at least 30 minutes of exercise that causes your heart to beat faster (aerobic exercise) most days of the week. Activities may include walking, swimming, or biking.  Work with your health care provider or diet and nutrition specialist (dietitian) to adjust your eating plan to your individual calorie needs. Reading food labels   Check food labels for the amount of sodium per serving. Choose foods with less than 5 percent of the Daily Value of sodium. Generally, foods with less than 300 mg of sodium per serving fit into this eating plan.  To find whole grains, look for the word "whole" as the first word in the ingredient list. Shopping  Buy products labeled as "low-sodium" or  "no salt added."  Buy fresh foods. Avoid canned foods and premade or frozen meals. Cooking  Avoid adding salt when cooking. Use salt-free seasonings or herbs instead of table salt or sea salt. Check with your health care provider or pharmacist before using salt substitutes.  Do not fry foods. Cook foods using healthy methods such as baking, boiling, grilling, and broiling instead.  Cook with heart-healthy oils, such as olive, canola, soybean, or sunflower oil. Meal planning  Eat a balanced diet that includes: ? 5 or more servings of fruits and vegetables each day. At each meal, try to fill half of your plate with fruits and vegetables. ? Up to 6-8 servings of whole grains each day. ? Less than 6 oz of lean meat, poultry, or fish each day. A 3-oz serving of meat is about the same size as a deck of cards. One egg equals 1 oz. ? 2 servings of low-fat dairy each day. ? A serving of nuts, seeds, or beans 5 times each week. ? Heart-healthy fats. Healthy fats called Omega-3 fatty acids are found in foods such as flaxseeds and coldwater fish, like sardines, salmon, and mackerel.  Limit how much you eat of the following: ? Canned or prepackaged foods. ? Food that is high in trans fat, such as fried foods. ? Food that is high in saturated fat, such as fatty meat. ?  Sweets, desserts, sugary drinks, and other foods with added sugar. ? Full-fat dairy products.  Do not salt foods before eating.  Try to eat at least 2 vegetarian meals each week.  Eat more home-cooked food and less restaurant, buffet, and fast food.  When eating at a restaurant, ask that your food be prepared with less salt or no salt, if possible. What foods are recommended? The items listed may not be a complete list. Talk with your dietitian about what dietary choices are best for you. Grains Whole-grain or whole-wheat bread. Whole-grain or whole-wheat pasta. Brown rice. [place]. Bulgur. Whole-grain and low-sodium  cereals. Pita bread. Low-fat, low-sodium crackers. Whole-wheat flour tortillas. Vegetables Fresh or frozen vegetables (raw, steamed, roasted, or grilled). Low-sodium or reduced-sodium tomato and vegetable juice. Low-sodium or reduced-sodium tomato sauce and tomato paste. Low-sodium or reduced-sodium canned vegetables. Fruits All fresh, dried, or frozen fruit. Canned fruit in natural juice (without added sugar). Meat and other protein foods Skinless chicken or [place]. Ground chicken or [place]. Pork with fat trimmed off. Fish and seafood. Egg whites. Dried beans, peas, or lentils. Unsalted nuts, nut butters, and seeds. Unsalted canned beans. Lean cuts of beef with fat trimmed off. Low-sodium, lean deli meat. Dairy Low-fat (1%) or fat-free (skim) milk. Fat-free, low-fat, or reduced-fat cheeses. Nonfat, low-sodium ricotta or cottage cheese. Low-fat or nonfat yogurt. Low-fat, low-sodium cheese. Fats and oils Soft margarine without trans fats. Vegetable oil. Low-fat, reduced-fat, or light mayonnaise and salad dressings (reduced-sodium). Canola, safflower, olive, soybean, and sunflower oils. Avocado. Seasoning and other foods Herbs. Spices. Seasoning mixes without salt. Unsalted popcorn and pretzels. Fat-free sweets. What foods are not recommended? The items listed may not be a complete list. Talk with your dietitian about what dietary choices are best for you. Grains Baked goods made with fat, such as croissants, muffins, or some breads. Dry pasta or rice meal packs. Vegetables Creamed or fried vegetables. Vegetables in a cheese sauce. Regular canned vegetables (not low-sodium or reduced-sodium). Regular canned tomato sauce and paste (not low-sodium or reduced-sodium). Regular tomato and vegetable juice (not low-sodium or reduced-sodium). Angie Fava. Olives. Fruits Canned fruit in a light or heavy syrup. Fried fruit. Fruit in cream or butter sauce. Meat and other protein foods Fatty cuts of meat. Ribs.  Fried meat. Berniece Salines. Sausage. Bologna and other processed lunch meats. Salami. Fatback. Hotdogs. Bratwurst. Salted nuts and seeds. Canned beans with added salt. Canned or smoked fish. Whole eggs or egg yolks. Chicken or [place] with skin. Dairy Whole or 2% milk, cream, and half-and-half. Whole or full-fat cream cheese. Whole-fat or sweetened yogurt. Full-fat cheese. Nondairy creamers. Whipped toppings. Processed cheese and cheese spreads. Fats and oils Butter. Stick margarine. Lard. Shortening. Ghee. Bacon fat. Tropical oils, such as coconut, palm kernel, or palm oil. Seasoning and other foods Salted popcorn and pretzels. Onion salt, garlic salt, seasoned salt, table salt, and sea salt. Worcestershire sauce. Tartar sauce. Barbecue sauce. Teriyaki sauce. Soy sauce, including reduced-sodium. Steak sauce. Canned and packaged gravies. Fish sauce. Oyster sauce. Cocktail sauce. Horseradish that you find on the shelf. Ketchup. Mustard. Meat flavorings and tenderizers. Bouillon cubes. Hot sauce and Tabasco sauce. Premade or packaged marinades. Premade or packaged taco seasonings. Relishes. Regular salad dressings. Where to find more information:  National Heart, Lung, and [place]: https://wilson-eaton.com/  American Heart Association: www.heart.org Summary  The DASH eating plan is a healthy eating plan that has been shown to reduce high blood pressure (hypertension). It may also reduce your risk for type 2 diabetes,  heart disease, and stroke.  With the DASH eating plan, you should limit salt (sodium) intake to 2,300 mg a day. If you have hypertension, you may need to reduce your sodium intake to 1,500 mg a day.  When on the DASH eating plan, aim to eat more fresh fruits and vegetables, whole grains, lean proteins, low-fat dairy, and heart-healthy fats.  Work with your health care provider or diet and nutrition specialist (dietitian) to adjust your eating plan to your individual calorie needs. This  information is not intended to replace advice given to you by your health care provider. Make sure you discuss any questions you have with your health care provider. Document Released: 09/02/2011 Document Revised: 09/06/2016 Document Reviewed: 09/06/2016 Elsevier Interactive Patient Education  2019 [employer].

## 2019-02-14 DIAGNOSIS — N183 Chronic kidney disease, stage 3 (moderate): Secondary | ICD-10-CM | POA: Diagnosis not present

## 2019-02-14 DIAGNOSIS — J439 Emphysema, unspecified: Secondary | ICD-10-CM | POA: Diagnosis not present

## 2019-02-14 DIAGNOSIS — M810 Age-related osteoporosis without current pathological fracture: Secondary | ICD-10-CM | POA: Diagnosis not present

## 2019-02-14 DIAGNOSIS — S81801A Unspecified open wound, right lower leg, initial encounter: Secondary | ICD-10-CM | POA: Diagnosis not present

## 2019-02-14 DIAGNOSIS — M519 Unspecified thoracic, thoracolumbar and lumbosacral intervertebral disc disorder: Secondary | ICD-10-CM | POA: Diagnosis not present

## 2019-02-14 DIAGNOSIS — Z7901 Long term (current) use of anticoagulants: Secondary | ICD-10-CM | POA: Diagnosis not present

## 2019-02-14 DIAGNOSIS — I2723 Pulmonary hypertension due to lung diseases and hypoxia: Secondary | ICD-10-CM | POA: Diagnosis not present

## 2019-02-14 DIAGNOSIS — Z9981 Dependence on supplemental oxygen: Secondary | ICD-10-CM | POA: Diagnosis not present

## 2019-02-14 DIAGNOSIS — E039 Hypothyroidism, unspecified: Secondary | ICD-10-CM | POA: Diagnosis not present

## 2019-02-14 DIAGNOSIS — R531 Weakness: Secondary | ICD-10-CM | POA: Diagnosis not present

## 2019-02-14 DIAGNOSIS — I351 Nonrheumatic aortic (valve) insufficiency: Secondary | ICD-10-CM | POA: Diagnosis not present

## 2019-02-14 DIAGNOSIS — J449 Chronic obstructive pulmonary disease, unspecified: Secondary | ICD-10-CM | POA: Diagnosis not present

## 2019-02-14 DIAGNOSIS — J9611 Chronic respiratory failure with hypoxia: Secondary | ICD-10-CM | POA: Diagnosis not present

## 2019-02-14 DIAGNOSIS — I5032 Chronic diastolic (congestive) heart failure: Secondary | ICD-10-CM | POA: Diagnosis not present

## 2019-02-14 DIAGNOSIS — I110 Hypertensive heart disease with heart failure: Secondary | ICD-10-CM | POA: Diagnosis not present

## 2019-02-14 DIAGNOSIS — I480 Paroxysmal atrial fibrillation: Secondary | ICD-10-CM | POA: Diagnosis not present

## 2019-02-14 DIAGNOSIS — Z4800 Encounter for change or removal of nonsurgical wound dressing: Secondary | ICD-10-CM | POA: Diagnosis not present

## 2019-02-14 DIAGNOSIS — E559 Vitamin D deficiency, unspecified: Secondary | ICD-10-CM | POA: Diagnosis not present

## 2019-02-14 DIAGNOSIS — E785 Hyperlipidemia, unspecified: Secondary | ICD-10-CM | POA: Diagnosis not present

## 2019-02-14 DIAGNOSIS — E43 Unspecified severe protein-calorie malnutrition: Secondary | ICD-10-CM | POA: Diagnosis not present

## 2019-02-15 ENCOUNTER — Telehealth: Payer: Self-pay | Admitting: Adult Health Nurse Practitioner

## 2019-02-15 ENCOUNTER — Encounter: Payer: Self-pay | Admitting: Adult Health Nurse Practitioner

## 2019-02-15 DIAGNOSIS — N183 Chronic kidney disease, stage 3 (moderate): Secondary | ICD-10-CM | POA: Diagnosis not present

## 2019-02-15 DIAGNOSIS — I5032 Chronic diastolic (congestive) heart failure: Secondary | ICD-10-CM | POA: Diagnosis not present

## 2019-02-15 DIAGNOSIS — M810 Age-related osteoporosis without current pathological fracture: Secondary | ICD-10-CM | POA: Diagnosis not present

## 2019-02-15 DIAGNOSIS — M519 Unspecified thoracic, thoracolumbar and lumbosacral intervertebral disc disorder: Secondary | ICD-10-CM | POA: Diagnosis not present

## 2019-02-15 DIAGNOSIS — E559 Vitamin D deficiency, unspecified: Secondary | ICD-10-CM | POA: Diagnosis not present

## 2019-02-15 DIAGNOSIS — Z7901 Long term (current) use of anticoagulants: Secondary | ICD-10-CM | POA: Diagnosis not present

## 2019-02-15 DIAGNOSIS — J439 Emphysema, unspecified: Secondary | ICD-10-CM | POA: Diagnosis not present

## 2019-02-15 DIAGNOSIS — I2723 Pulmonary hypertension due to lung diseases and hypoxia: Secondary | ICD-10-CM | POA: Diagnosis not present

## 2019-02-15 DIAGNOSIS — I351 Nonrheumatic aortic (valve) insufficiency: Secondary | ICD-10-CM | POA: Diagnosis not present

## 2019-02-15 DIAGNOSIS — E785 Hyperlipidemia, unspecified: Secondary | ICD-10-CM | POA: Diagnosis not present

## 2019-02-15 DIAGNOSIS — J9611 Chronic respiratory failure with hypoxia: Secondary | ICD-10-CM | POA: Diagnosis not present

## 2019-02-15 DIAGNOSIS — I110 Hypertensive heart disease with heart failure: Secondary | ICD-10-CM | POA: Diagnosis not present

## 2019-02-15 DIAGNOSIS — E43 Unspecified severe protein-calorie malnutrition: Secondary | ICD-10-CM | POA: Diagnosis not present

## 2019-02-15 DIAGNOSIS — E039 Hypothyroidism, unspecified: Secondary | ICD-10-CM | POA: Diagnosis not present

## 2019-02-15 DIAGNOSIS — Z9981 Dependence on supplemental oxygen: Secondary | ICD-10-CM | POA: Diagnosis not present

## 2019-02-15 DIAGNOSIS — I480 Paroxysmal atrial fibrillation: Secondary | ICD-10-CM | POA: Diagnosis not present

## 2019-02-15 DIAGNOSIS — Z4800 Encounter for change or removal of nonsurgical wound dressing: Secondary | ICD-10-CM | POA: Diagnosis not present

## 2019-02-15 NOTE — Progress Notes (Signed)
I connected with  Andrea [name] on 02/16/19 by telephone and verified that I am speaking with the correct person using two identifiers.   I discussed the limitations of evaluation and management by telemedicine. The patient expressed understanding and agreed to proceed.    Assessment and Plan:  Diagnoses and all orders for this visit:  Bilateral lower extremity edema Taking Lasix 29m BID for one week. Then take 460mdaily in am Increase activity, walking  Chronic diastolic congestive heart failure (HCC) CHF Disease process and medications discussed. Questions answered fully. Emphasized salt restriction, less than 200058m day. Encouraged daily monitoring of the patient's weight, call office if 5 lb weight loss or gain in a day.  Encouraged regular exercise. If any increasing shortness of breath, swelling, or chest pressure go to ER immediately.  decrease your fluid intake to less than 2 L daily please remember to always increase your potassium intake with any increase of your fluid pill.   Chronic respiratory failure with hypoxia (HCC) Continue O2 Around second hand smoke Discussed importance of avoiding this when possible to prevent exacerbations  CKD (chronic kidney disease) stage 3, GFR 30-59 ml/min (HCC) Increase fluids, avoid NSAIDS, monitor sugars, will monitor  Wound of left lower extremity, sequela Home health to visit for wound care Calcium alginate dressing changes.  BMI less than 19,adult Discussed with daughter ensure/boost between meal Increasing protein intake Encouraging water intake      Further disposition pending results of labs. Discussed med's effects and SE's.   Over 30 minutes of exam, counseling, chart review, and critical decision making was performed.   Future Appointments  Date Time Provider [place]/22/2020 12:00 PM McCGarnet SierrasP [facility] None  02/28/2019  3:00 PM ColVicie MuttersA-C [facility] None  03/21/2019 11:00  AM Cre[facility]elon PerlaD [facility]  2:00 PM CorLiane ComberP [facility] None    ------------------------------------------------------------------------------------------------------------------   HPI [age]o.female presents for follow up on BLE edema.  Last appointment three days ago patient presented with +3 BLE edema from knee to to toes.  Home health has since made contact and initial visit for assessment.  Today I am speaking with daughter Andrea JunglingPatient was evaluated in person on 02/13/19.  Concern for patients ability to carry out plan of care as she was not accompanied in the office with husband or family member.  Home health referral was placed and initial contact has been made.  Daughter reports that her mothers legs look much better.  Home health would like to wrap her legs with ACE bandages. In am and remove in evening.  It has been reported that family teaching has been initiated.  Her daughter reports she is elevating her feet instead of just sitting in the chair with them on the ground.  There has also been wound care imitated for the biopsy location with delayed healing related to edema.  The daughter reports she is making sure that her mother is taking her lasix every morning.  Reinforced it is ok to take whenever she wakes up.  Patient reported sometimes not waking until 10-11am.  Suspect she was not taking lasix on days she is sleeping in.  Patient denies any shortness of breath at rest, chest pains, increase in edema or difficulty with ambulation.  Past Medical History:  Diagnosis Date  . Aortic regurgitation   . [place] . COPD (chronic obstructive pulmonary disease) ([place] . DJD (degenerative joint disease)   .  HLD (hyperlipidemia)   . HTN (hypertension)   . Hypothyroidism   . Palpitations    a. has known history of PAC's and PVC's. b. 09/2015: monitor showed sinus rhythm with occasional PAC's and a brief episode of PAT.  . S/P  thoracentesis      Allergies  Allergen Reactions  . Vancomycin     Worsening kidney function  . Sulfa Antibiotics Palpitations    Unknown    Current Outpatient Medications on File Prior to Visit  Medication Sig  . acetaZOLAMIDE (DIAMOX) 250 MG tablet TAKE ONE TABLET (250MG TOTAL) BY MOUTH DAILY  . ALPRAZolam (XANAX) 0.5 MG tablet TAKE ONE-HALF TO ONE TABLET 2 TO 3 TIMESDAILY FOR NERVES OR SLEEP MAYCAUSE DROWSINESS  . apixaban (ELIQUIS) 2.5 MG TABS tablet Take 1 tablet (2.5 mg total) by mouth 2 (two) times daily.  . cefUROXime (CEFTIN) 250 MG tablet Take 1 tablet (250 mg total) by mouth 2 (two) times daily.  . Cholecalciferol 50 MCG (2000 UT) CAPS Take 1 capsule (2,000 Units total) by mouth daily.  [name] diltiazem (CARDIZEM CD) 240 MG 24 hr capsule Take 1 capsule (240 mg total) by mouth daily.  . furosemide (LASIX) 40 MG tablet TAKE ONE TABLET (40MG TOTAL) BY MOUTH TWICE DAILY  . ipratropium-albuterol (DUONEB) 0.5-2.5 (3) MG/3ML SOLN Take 3 mLs by nebulization QID.  [name] levothyroxine (SYNTHROID, LEVOTHROID) 50 MCG tablet TAKE ONE TABLET BY MOUTH ONCE A DAY AS DIRECTED  . metoprolol tartrate (LOPRESSOR) 50 MG tablet TAKE ONE TABLET (50MG TOTAL) BY MOUTH TWICE DAILY  . potassium chloride (K-DUR) 10 MEQ tablet Take 1 tablet (10 mEq total) by mouth daily.  . Probiotic Product (PROBIOTIC-10) CHEW Chew 2 tablets by mouth daily.  [name] Respiratory Therapy Supplies (FLUTTER) DEVI Use as directed.   No current facility-administered medications on file prior to visit.     ROS: all negative except above.   Physical Exam:  There were no vitals taken for this visit. Spoke with daughter, patient was on speaker and answered some of the questioning though hard of hearing. General : Well sounding patient in no apparent distress HEENT: no hoarseness, no cough for duration of visit Lungs: speaks in complete sentences, no audible wheezing, no apparent distress Neurological: alert, oriented x 3 Psychiatric:  pleasant, judgement appropriate    [name], NP 1230 PM [facility]

## 2019-02-15 NOTE — Telephone Encounter (Signed)
[place] RN called to advised patient refused orders for wound care as prescrided. States patient would accept  biopsy wound to be sterlie saline cleaned and wrapped. Verbal ok given until Televisit with provider 02/16/19.

## 2019-02-16 ENCOUNTER — Ambulatory Visit: Payer: Medicare Other | Admitting: Adult Health Nurse Practitioner

## 2019-02-16 ENCOUNTER — Other Ambulatory Visit: Payer: Self-pay

## 2019-02-16 DIAGNOSIS — Z4800 Encounter for change or removal of nonsurgical wound dressing: Secondary | ICD-10-CM | POA: Diagnosis not present

## 2019-02-16 DIAGNOSIS — N183 Chronic kidney disease, stage 3 unspecified: Secondary | ICD-10-CM

## 2019-02-16 DIAGNOSIS — I110 Hypertensive heart disease with heart failure: Secondary | ICD-10-CM | POA: Diagnosis not present

## 2019-02-16 DIAGNOSIS — I5032 Chronic diastolic (congestive) heart failure: Secondary | ICD-10-CM

## 2019-02-16 DIAGNOSIS — M519 Unspecified thoracic, thoracolumbar and lumbosacral intervertebral disc disorder: Secondary | ICD-10-CM | POA: Diagnosis not present

## 2019-02-16 DIAGNOSIS — S81801A Unspecified open wound, right lower leg, initial encounter: Secondary | ICD-10-CM | POA: Diagnosis not present

## 2019-02-16 DIAGNOSIS — Z9981 Dependence on supplemental oxygen: Secondary | ICD-10-CM | POA: Diagnosis not present

## 2019-02-16 DIAGNOSIS — Z7901 Long term (current) use of anticoagulants: Secondary | ICD-10-CM | POA: Diagnosis not present

## 2019-02-16 DIAGNOSIS — E039 Hypothyroidism, unspecified: Secondary | ICD-10-CM | POA: Diagnosis not present

## 2019-02-16 DIAGNOSIS — R600 Localized edema: Secondary | ICD-10-CM | POA: Diagnosis not present

## 2019-02-16 DIAGNOSIS — Z681 Body mass index (BMI) 19 or less, adult: Secondary | ICD-10-CM

## 2019-02-16 DIAGNOSIS — J439 Emphysema, unspecified: Secondary | ICD-10-CM | POA: Diagnosis not present

## 2019-02-16 DIAGNOSIS — I480 Paroxysmal atrial fibrillation: Secondary | ICD-10-CM | POA: Diagnosis not present

## 2019-02-16 DIAGNOSIS — J9611 Chronic respiratory failure with hypoxia: Secondary | ICD-10-CM

## 2019-02-16 DIAGNOSIS — R05 Cough: Secondary | ICD-10-CM | POA: Diagnosis not present

## 2019-02-16 DIAGNOSIS — I2723 Pulmonary hypertension due to lung diseases and hypoxia: Secondary | ICD-10-CM | POA: Diagnosis not present

## 2019-02-16 DIAGNOSIS — E43 Unspecified severe protein-calorie malnutrition: Secondary | ICD-10-CM | POA: Diagnosis not present

## 2019-02-16 DIAGNOSIS — S81802S Unspecified open wound, left lower leg, sequela: Secondary | ICD-10-CM

## 2019-02-16 DIAGNOSIS — M810 Age-related osteoporosis without current pathological fracture: Secondary | ICD-10-CM | POA: Diagnosis not present

## 2019-02-16 DIAGNOSIS — I351 Nonrheumatic aortic (valve) insufficiency: Secondary | ICD-10-CM | POA: Diagnosis not present

## 2019-02-16 DIAGNOSIS — J449 Chronic obstructive pulmonary disease, unspecified: Secondary | ICD-10-CM | POA: Diagnosis not present

## 2019-02-16 DIAGNOSIS — E785 Hyperlipidemia, unspecified: Secondary | ICD-10-CM | POA: Diagnosis not present

## 2019-02-16 DIAGNOSIS — E559 Vitamin D deficiency, unspecified: Secondary | ICD-10-CM | POA: Diagnosis not present

## 2019-02-20 DIAGNOSIS — I5032 Chronic diastolic (congestive) heart failure: Secondary | ICD-10-CM | POA: Diagnosis not present

## 2019-02-20 DIAGNOSIS — E785 Hyperlipidemia, unspecified: Secondary | ICD-10-CM | POA: Diagnosis not present

## 2019-02-20 DIAGNOSIS — I2723 Pulmonary hypertension due to lung diseases and hypoxia: Secondary | ICD-10-CM | POA: Diagnosis not present

## 2019-02-20 DIAGNOSIS — M810 Age-related osteoporosis without current pathological fracture: Secondary | ICD-10-CM | POA: Diagnosis not present

## 2019-02-20 DIAGNOSIS — Z7901 Long term (current) use of anticoagulants: Secondary | ICD-10-CM | POA: Diagnosis not present

## 2019-02-20 DIAGNOSIS — I351 Nonrheumatic aortic (valve) insufficiency: Secondary | ICD-10-CM | POA: Diagnosis not present

## 2019-02-20 DIAGNOSIS — I110 Hypertensive heart disease with heart failure: Secondary | ICD-10-CM | POA: Diagnosis not present

## 2019-02-20 DIAGNOSIS — E039 Hypothyroidism, unspecified: Secondary | ICD-10-CM | POA: Diagnosis not present

## 2019-02-20 DIAGNOSIS — Z9981 Dependence on supplemental oxygen: Secondary | ICD-10-CM | POA: Diagnosis not present

## 2019-02-20 DIAGNOSIS — M519 Unspecified thoracic, thoracolumbar and lumbosacral intervertebral disc disorder: Secondary | ICD-10-CM | POA: Diagnosis not present

## 2019-02-20 DIAGNOSIS — N183 Chronic kidney disease, stage 3 (moderate): Secondary | ICD-10-CM | POA: Diagnosis not present

## 2019-02-20 DIAGNOSIS — E559 Vitamin D deficiency, unspecified: Secondary | ICD-10-CM | POA: Diagnosis not present

## 2019-02-20 DIAGNOSIS — E43 Unspecified severe protein-calorie malnutrition: Secondary | ICD-10-CM | POA: Diagnosis not present

## 2019-02-20 DIAGNOSIS — I480 Paroxysmal atrial fibrillation: Secondary | ICD-10-CM | POA: Diagnosis not present

## 2019-02-20 DIAGNOSIS — Z4800 Encounter for change or removal of nonsurgical wound dressing: Secondary | ICD-10-CM | POA: Diagnosis not present

## 2019-02-20 DIAGNOSIS — J439 Emphysema, unspecified: Secondary | ICD-10-CM | POA: Diagnosis not present

## 2019-02-20 DIAGNOSIS — J9611 Chronic respiratory failure with hypoxia: Secondary | ICD-10-CM | POA: Diagnosis not present

## 2019-02-22 DIAGNOSIS — I5032 Chronic diastolic (congestive) heart failure: Secondary | ICD-10-CM | POA: Diagnosis not present

## 2019-02-22 DIAGNOSIS — Z9981 Dependence on supplemental oxygen: Secondary | ICD-10-CM | POA: Diagnosis not present

## 2019-02-22 DIAGNOSIS — I110 Hypertensive heart disease with heart failure: Secondary | ICD-10-CM | POA: Diagnosis not present

## 2019-02-22 DIAGNOSIS — E785 Hyperlipidemia, unspecified: Secondary | ICD-10-CM | POA: Diagnosis not present

## 2019-02-22 DIAGNOSIS — E039 Hypothyroidism, unspecified: Secondary | ICD-10-CM | POA: Diagnosis not present

## 2019-02-22 DIAGNOSIS — E43 Unspecified severe protein-calorie malnutrition: Secondary | ICD-10-CM | POA: Diagnosis not present

## 2019-02-22 DIAGNOSIS — Z4800 Encounter for change or removal of nonsurgical wound dressing: Secondary | ICD-10-CM | POA: Diagnosis not present

## 2019-02-22 DIAGNOSIS — M519 Unspecified thoracic, thoracolumbar and lumbosacral intervertebral disc disorder: Secondary | ICD-10-CM | POA: Diagnosis not present

## 2019-02-22 DIAGNOSIS — Z7901 Long term (current) use of anticoagulants: Secondary | ICD-10-CM | POA: Diagnosis not present

## 2019-02-22 DIAGNOSIS — I351 Nonrheumatic aortic (valve) insufficiency: Secondary | ICD-10-CM | POA: Diagnosis not present

## 2019-02-22 DIAGNOSIS — I480 Paroxysmal atrial fibrillation: Secondary | ICD-10-CM | POA: Diagnosis not present

## 2019-02-22 DIAGNOSIS — N183 Chronic kidney disease, stage 3 (moderate): Secondary | ICD-10-CM | POA: Diagnosis not present

## 2019-02-22 DIAGNOSIS — J9611 Chronic respiratory failure with hypoxia: Secondary | ICD-10-CM | POA: Diagnosis not present

## 2019-02-22 DIAGNOSIS — J439 Emphysema, unspecified: Secondary | ICD-10-CM | POA: Diagnosis not present

## 2019-02-22 DIAGNOSIS — I2723 Pulmonary hypertension due to lung diseases and hypoxia: Secondary | ICD-10-CM | POA: Diagnosis not present

## 2019-02-22 DIAGNOSIS — E559 Vitamin D deficiency, unspecified: Secondary | ICD-10-CM | POA: Diagnosis not present

## 2019-02-22 DIAGNOSIS — M810 Age-related osteoporosis without current pathological fracture: Secondary | ICD-10-CM | POA: Diagnosis not present

## 2019-02-23 NOTE — Patient Instructions (Addendum)
Continue to take your lasix 69m BID through 02/20/19.    Elevated legs when sitting in chair Put on ACE wraps first thing in the morning an ou may remove them before bed. Continue to weigh self daily Discussed smoking cessation  Home Health will continue visit and provide communication / documentation as needed.  Your next appointment is in June for physical. Annual Wellness Visit completed on 01/29/19.    - Vit D  And Vit C 1,000 mg  are recommended to help protect  against the Covid_19 and other Corona viruses.   - Also it's recommended to take Zinc 50 mg to help  protect against the Covid_19  And best place to get  is also on [employer] and don't pay more than 6-8 cents /pill !   ================================ Coronavirus (COVID-19) Are you at risk?  Are you at risk for the Coronavirus (COVID-19)?  To be considered HIGH RISK for Coronavirus (COVID-19), you have to meet the following criteria:  . Traveled to [place] or in the [place] or [place] and have fever, cough, and shortness of breath within the last 2 weeks of travel OR . Been in close contact with a person diagnosed with COVID-19 within the last 2 weeks and have  . fever, cough,and shortness of breath .  . IF YOU DO NOT MEET THESE CRITERIA, YOU ARE CONSIDERED LOW RISK FOR COVID-19.  What to do if you are HIGH RISK for COVID-19?  .Marland KitchenIf you are having a medical emergency, call 911. . Seek medical care right away. Before you go to a doctor's office, urgent care or emergency department, .  call ahead and tell them about your recent travel, contact with someone diagnosed with COVID-19  .  and your symptoms.  . You should receive instructions from your physician's office regarding next steps of care.  . When you arrive at healthcare provider, tell the healthcare staff immediately you have returned from  . visiting [place] or traveled in the [place] the last two weeks or you have been in close contact with a person diagnosed with  . COVID-19 in the last 2 weeks.   . Tell the health care staff about your symptoms: fever, cough and shortness of breath. . After you have been seen by a medical provider, you will be either: o Tested for (COVID-19) and discharged home on quarantine except to seek medical care if  o symptoms worsen, and asked to  - Stay home and avoid contact with others until you get your results (4-5 days)  - Avoid travel on public transportation if possible (such as bus, train, or airplane) or o Sent to the Emergency Department by EMS for evaluation, COVID-19 testing  and  o possible admission depending on your condition and test results.  What to do if you are LOW RISK for COVID-19?  Reduce your risk of any infection by using the same precautions used for avoiding the common cold or flu:  .Marland KitchenWash your hands often with soap and warm water for at least 20 seconds.  If soap and water are not readily available,  . use an alcohol-based hand sanitizer with at least 60% alcohol.  . If coughing or sneezing, cover your mouth and nose by coughing or sneezing into the elbow areas of your  shirt or coat, .  into a tissue or into your sleeve (not your hands). . Avoid shaking hands with others and consider head nods or verbal greetings only. . Avoid touching your eyes, nose, or mouth with unwashed hands.  . Avoid close contact with people who are sick. . Avoid places or events with large numbers of people in one location, like concerts or sporting events. . Carefully consider travel plans you have or are making. . If you are planning any travel outside or inside the [place], visit the CDC's Travelers' Health webpage for the latest health notices. . If you have some symptoms but not all symptoms, continue to monitor at home and seek  medical attention  . if your symptoms worsen. . If you are having a medical emergency, call 911.   . >>>>>>>>>>>>>>>>>>>>>>>>>>>>>>>>> . We Do NOT Approve of  [facility], [employer]  Our Patients  To Do Home Visits & We Do NOT Approve of [employer] > > > > > > > > > > > > > > > > > > > > > > > > > > > > > > > > > > > > > > >  Preventive Care for Adults  A healthy lifestyle and preventive care can promote health and wellness. Preventive health guidelines for women include the following key practices.  A routine yearly physical is a good way to check with your health care provider about your health and preventive screening. It is a chance to share any concerns and updates on your health and to receive a thorough exam.  Visit your dentist for a routine exam and preventive care every 6 months. Brush your teeth twice a day and floss once a day. Good oral hygiene prevents tooth decay and gum disease.  The frequency of eye exams is based on your age, health, family medical history, use of contact lenses, and other factors. Follow your health care provider's recommendations for frequency of eye exams.  Eat a healthy diet. Foods like vegetables, fruits, whole grains, low-fat dairy products, and lean protein foods contain the nutrients you need without too many calories. Decrease your intake of foods high in solid fats, added sugars, and salt. Eat the right amount of calories for you. Get information about a proper diet from your health care provider, if necessary.  Regular physical exercise is one of the most important things you can do for your health. Most adults should get at least 150 minutes of moderate-intensity exercise (any activity that increases your heart rate and causes you to sweat) each week. In addition, most adults need muscle-strengthening exercises on 2 or more days a week.  Maintain a healthy weight. The body mass index (BMI) is a screening tool to  identify possible weight problems. It provides an estimate of body fat based on height and weight. Your health care provider can find your BMI and can help you achieve or maintain a healthy weight. For adults 20 years and older:  A BMI below 18.5 is considered underweight.  A BMI of 18.5 to 24.9 is normal.  A BMI of 25 to 29.9 is considered overweight.  A BMI of 30 and above is considered obese.  Maintain normal blood lipids and cholesterol levels by exercising and minimizing your intake of saturated fat. Eat a balanced diet with plenty of fruit and vegetables. If your lipid or cholesterol levels are high, you are over 50, or you are at high risk for heart disease, you  may need your cholesterol levels checked more frequently. Ongoing high lipid and cholesterol levels should be treated with medicines if diet and exercise are not working.  If you smoke, find out from your health care provider how to quit. If you do not use tobacco, do not start.  Lung cancer screening is recommended for adults aged 32-80 years who are at high risk for developing lung cancer because of a history of smoking. A yearly low-dose CT scan of the lungs is recommended for people who have at least a 30-pack-year history of smoking and are a current smoker or have quit within the past 15 years. A pack year of smoking is smoking an average of 1 pack of cigarettes a day for 1 year (for example: 1 pack a day for 30 years or 2 packs a day for 15 years). Yearly screening should continue until the smoker has stopped smoking for at least 15 years. Yearly screening should be stopped for people who develop a health problem that would prevent them from having lung cancer treatment.  Avoid use of street drugs. Do not share needles with anyone. Ask for help if you need support or instructions about stopping the use of drugs.  High blood pressure causes heart disease and increases the risk of stroke.  Ongoing high blood pressure should be  treated with medicines if weight loss and exercise do not work.  If you are 32-77 years old, ask your health care provider if you should take aspirin to prevent strokes.  Diabetes screening involves taking a blood sample to check your fasting blood sugar level. This should be done once every 3 years, after age 19, if you are within normal weight and without risk factors for diabetes. Testing should be considered at a younger age or be carried out more frequently if you are overweight and have at least 1 risk factor for diabetes.  Breast cancer screening is essential preventive care for women. You should practice "breast self-awareness." This means understanding the normal appearance and feel of your breasts and may include breast self-examination. Any changes detected, no matter how small, should be reported to a health care provider. Women in their 9s and 30s should have a clinical breast exam (CBE) by a health care provider as part of a regular health exam every 1 to 3 years. After age 41, women should have a CBE every year. Starting at age 32, women should consider having a mammogram (breast X-ray test) every year. Women who have a family history of breast cancer should talk to their health care provider about genetic screening. Women at a high risk of breast cancer should talk to their health care providers about having an MRI and a mammogram every year.  Breast cancer gene (BRCA)-related cancer risk assessment is recommended for women who have family members with BRCA-related cancers. BRCA-related cancers include breast, ovarian, tubal, and peritoneal cancers. Having family members with these cancers may be associated with an increased risk for harmful changes (mutations) in the breast cancer genes BRCA1 and BRCA2. Results of the assessment will determine the need for genetic counseling and BRCA1 and BRCA2 testing.  Routine pelvic exams to screen for cancer are no longer recommended for nonpregnant  women who are considered low risk for cancer of the pelvic organs (ovaries, uterus, and vagina) and who do not have symptoms. Ask your health care provider if a screening pelvic exam is right for you.  If you have had past treatment for cervical cancer or  a condition that could lead to cancer, you need Pap tests and screening for cancer for at least 20 years after your treatment. If Pap tests have been discontinued, your risk factors (such as having a new sexual partner) need to be reassessed to determine if screening should be resumed. Some women have medical problems that increase the chance of getting cervical cancer. In these cases, your health care provider may recommend more frequent screening and Pap tests.    Colorectal cancer can be detected and often prevented. Most routine colorectal cancer screening begins at the age of 23 years and continues through age 75 years. However, your health care provider may recommend screening at an earlier age if you have risk factors for colon cancer. On a yearly basis, your health care provider may provide home test kits to check for hidden blood in the stool. Use of a small camera at the end of a tube, to directly examine the colon (sigmoidoscopy or colonoscopy), can detect the earliest forms of colorectal cancer. Talk to your health care provider about this at age 75, when routine screening begins.  Direct exam of the colon should be repeated every 5-10 years through age 69 years, unless early forms of pre-cancerous polyps or small growths are found.  Osteoporosis is a disease in which the bones lose minerals and strength with aging. This can result in serious bone fractures or breaks. The risk of osteoporosis can be identified using a bone density scan. Women ages 39 years and over and women at risk for fractures or osteoporosis should discuss screening with their health care providers. Ask your health care provider whether you should take a calcium supplement  or vitamin D to reduce the rate of osteoporosis.  Menopause can be associated with physical symptoms and risks. Hormone replacement therapy is available to decrease symptoms and risks. You should talk to your health care provider about whether hormone replacement therapy is right for you.  Use sunscreen. Apply sunscreen liberally and repeatedly throughout the day. You should seek shade when your shadow is shorter than you. Protect yourself by wearing long sleeves, pants, a wide-brimmed hat, and sunglasses year round, whenever you are outdoors.  Once a month, do a whole body skin exam, using a mirror to look at the skin on your back. Tell your health care provider of new moles, moles that have irregular borders, moles that are larger than a pencil eraser, or moles that have changed in shape or color.  Stay current with required vaccines (immunizations).  Influenza vaccine. All adults should be immunized every year.  Tetanus, diphtheria, and acellular pertussis (Td, Tdap) vaccine. Pregnant women should receive 1 dose of Tdap vaccine during each pregnancy. The dose should be obtained regardless of the length of time since the last dose. Immunization is preferred during the 27th-36th week of gestation. An adult who has not previously received Tdap or who does not know her vaccine status should receive 1 dose of Tdap. This initial dose should be followed by tetanus and diphtheria toxoids (Td) booster doses every 10 years. Adults with an unknown or incomplete history of completing a 3-dose immunization series with Td-containing vaccines should begin or complete a primary immunization series including a Tdap dose. Adults should receive a Td booster every 10 years.    Zoster vaccine. One dose is recommended for adults aged 83 years or older unless certain conditions are present.    Pneumococcal 13-valent conjugate (PCV13) vaccine. When indicated, a person who is uncertain of  her immunization history and  has no record of immunization should receive the PCV13 vaccine. An adult aged 26 years or older who has certain medical conditions and has not been previously immunized should receive 1 dose of PCV13 vaccine. This PCV13 should be followed with a dose of pneumococcal polysaccharide (PPSV23) vaccine. The PPSV23 vaccine dose should be obtained at least 1 or more year(s) after the dose of PCV13 vaccine. An adult aged 2 years or older who has certain medical conditions and previously received 1 or more doses of PPSV23 vaccine should receive 1 dose of PCV13. The PCV13 vaccine dose should be obtained 1 or more years after the last PPSV23 vaccine dose.    Pneumococcal polysaccharide (PPSV23) vaccine. When PCV13 is also indicated, PCV13 should be obtained first. All adults aged 16 years and older should be immunized. An adult younger than age 40 years who has certain medical conditions should be immunized. Any person who resides in a nursing home or long-term care facility should be immunized. An adult smoker should be immunized. People with an immunocompromised condition and certain other conditions should receive both PCV13 and PPSV23 vaccines. People with human immunodeficiency virus (HIV) infection should be immunized as soon as possible after diagnosis. Immunization during chemotherapy or radiation therapy should be avoided. Routine use of PPSV23 vaccine is not recommended for American Indians, [place], or people younger than 65 years unless there are medical conditions that require PPSV23 vaccine. When indicated, people who have unknown immunization and have no record of immunization should receive PPSV23 vaccine. One-time revaccination 5 years after the first dose of PPSV23 is recommended for people aged 19-64 years who have chronic kidney failure, nephrotic syndrome, asplenia, or immunocompromised conditions. People who received 1-2 doses of PPSV23 before age 51 years should receive another dose of PPSV23  vaccine at age 54 years or later if at least 5 years have passed since the previous dose. Doses of PPSV23 are not needed for people immunized with PPSV23 at or after age 56 years.   Preventive Services / Frequency  Ages 73 years and over  Blood pressure check.  Lipid and cholesterol check.  Lung cancer screening. / Every year if you are aged 65-80 years and have a 30-pack-year history of smoking and currently smoke or have quit within the past 15 years. Yearly screening is stopped once you have quit smoking for at least 15 years or develop a health problem that would prevent you from having lung cancer treatment.  Clinical breast exam.** / Every year after age 3 years.   BRCA-related cancer risk assessment.** / For women who have family members with a BRCA-related cancer (breast, ovarian, tubal, or peritoneal cancers).  Mammogram.** / Every year beginning at age 33 years and continuing for as long as you are in good health. Consult with your health care provider.  Pap test.** / Every 3 years starting at age 33 years through age 48 or 33 years with 3 consecutive normal Pap tests. Testing can be stopped between 65 and 70 years with 3 consecutive normal Pap tests and no abnormal Pap or HPV tests in the past 10 years.  Fecal occult blood test (FOBT) of stool. / Every year beginning at age 47 years and continuing until age 66 years. You may not need to do this test if you get a colonoscopy every 10 years.  Flexible sigmoidoscopy or colonoscopy.** / Every 5 years for a flexible sigmoidoscopy or every 10 years for a colonoscopy beginning at age  72 years and continuing until age 72 years.  Hepatitis C blood test.** / For all people born from [birth year] through 1963 and any individual with known risks for hepatitis C.  Osteoporosis screening.** / A one-time screening for women ages 43 years and over and women at risk for fractures or osteoporosis.  Skin self-exam. / Monthly.  Influenza vaccine. /  Every year.  Tetanus, diphtheria, and acellular pertussis (Tdap/Td) vaccine.** / 1 dose of Td every 10 years.  Zoster vaccine.** / 1 dose for adults aged 10 years or older.  Pneumococcal 13-valent conjugate (PCV13) vaccine.** / Consult your health care provider.  Pneumococcal polysaccharide (PPSV23) vaccine.** / 1 dose for all adults aged 16 years and older. Screening for abdominal aortic aneurysm (AAA)  by ultrasound is recommended for people who have history of high blood pressure or who are current or former smokers. ++++++++++++++++++++ Recommend Adult Low Dose Aspirin or  coated  Aspirin 81 mg daily  To reduce risk of Colon Cancer 40 %,  Skin Cancer 26 % ,  Melanoma 46%  and  Pancreatic cancer 60% ++++++++++++++++++++ Vitamin D goal  is between 70-100.  Please make sure that you are taking your Vitamin D as directed.  It is very important as a natural anti-inflammatory  helping hair, skin, and nails, as well as reducing stroke and heart attack risk.  It helps your bones and helps with mood. It also decreases numerous cancer risks so please take it as directed.  Low Vit D is associated with a 200-300% higher risk for CANCER  and 200-300% higher risk for HEART   ATTACK  &  STROKE.   .....................................[name] It is also associated with higher death rate at younger ages,  autoimmune diseases like Rheumatoid arthritis, Lupus, Multiple Sclerosis.    Also many other serious conditions, like depression, Alzheimer's Dementia, infertility, muscle aches, fatigue, fibromyalgia - just to name a few. ++++++++++++++++++ Recommend the book "The END of DIETING" by Dr Excell Seltzer  & the book "The END of DIABETES " by Dr Excell Seltzer At [email or web address] - get book & Audio CD's    Being diabetic has a  300% increased risk for heart attack, stroke, cancer, and alzheimer- type vascular dementia. It is very important that you work harder with diet by avoiding all foods that are white.  Avoid white rice (brown & wild rice is OK), white potatoes (sweetpotatoes in moderation is OK), White bread or wheat bread or anything made out of white flour like bagels, donuts, rolls, buns, biscuits, cakes, pastries, cookies, pizza crust, and pasta (made from white flour & egg whites) - vegetarian pasta or spinach or wheat pasta is OK. Multigrain breads like Arnold's or Pepper[employer], or multigrain sandwich thins or flatbreads.  Diet, exercise and weight loss can reverse and cure diabetes in the early stages.  Diet, exercise and weight loss is very important in the control and prevention of complications of diabetes which affects every system in your body, ie. Brain - dementia/stroke, eyes - glaucoma/blindness, heart - heart attack/heart failure, kidneys - dialysis, stomach - gastric paralysis, intestines - malabsorption, nerves - severe painful neuritis, circulation - gangrene & loss of a leg(s), and finally cancer and Alzheimers.    I recommend avoid fried & greasy foods,  sweets/candy, white rice (brown or wild rice or Quinoa is OK), white potatoes (sweet potatoes are OK) - anything made from white flour - bagels, doughnuts, rolls, buns, biscuits,white and wheat breads, pizza crust and traditional pasta made of white  flour & egg white(vegetarian pasta or spinach or wheat pasta is OK).  Multi-grain bread is OK - like multi-grain flat bread or sandwich thins. Avoid alcohol in excess. Exercise is also important.    Eat all the vegetables you want - avoid meat, especially red meat and dairy - especially cheese.  Cheese is the most concentrated form of trans-fats which is the worst thing to clog up our arteries. Veggie cheese is OK which can be found in the fresh produce section at [employer] or [employer] or [employer]  +++++++++++++++++++ DASH Eating Plan  DASH stands for "Dietary Approaches to Stop Hypertension."   The DASH eating plan is a healthy eating plan that has been shown to reduce high  blood pressure (hypertension). Additional health benefits may include reducing the risk of type 2 diabetes mellitus, heart disease, and stroke. The DASH eating plan may also help with weight loss. WHAT DO I NEED TO KNOW ABOUT THE DASH EATING PLAN? For the DASH eating plan, you will follow these general guidelines:  Choose foods with a percent daily value for sodium of less than 5% (as listed on the food label).  Use salt-free seasonings or herbs instead of table salt or sea salt.  Check with your health care provider or pharmacist before using salt substitutes.  Eat lower-sodium products, often labeled as "lower sodium" or "no salt added."  Eat fresh foods.  Eat more vegetables, fruits, and low-fat dairy products.  Choose whole grains. Look for the word "whole" as the first word in the ingredient list.  Choose fish   Limit sweets, desserts, sugars, and sugary drinks.  Choose heart-healthy fats.  Eat veggie cheese   Eat more home-cooked food and less restaurant, buffet, and fast food.  Limit fried foods.  Cook foods using methods other than frying.  Limit canned vegetables. If you do use them, rinse them well to decrease the sodium.  When eating at a restaurant, ask that your food be prepared with less salt, or no salt if possible.                      WHAT FOODS CAN I EAT? Read Dr Fara Olden Fuhrman's books on The End of Dieting & The End of Diabetes  Grains Whole grain or whole wheat bread. Brown rice. Whole grain or whole wheat pasta. Quinoa, bulgur, and whole grain cereals. Low-sodium cereals. Corn or whole wheat flour tortillas. Whole grain cornbread. Whole grain crackers. Low-sodium crackers.  Vegetables Fresh or frozen vegetables (raw, steamed, roasted, or grilled). Low-sodium or reduced-sodium tomato and vegetable juices. Low-sodium or reduced-sodium tomato sauce and paste. Low-sodium or reduced-sodium canned vegetables.   Fruits All fresh, canned (in natural juice), or  frozen fruits.  Protein Products  All fish and seafood.  Dried beans, peas, or lentils. Unsalted nuts and seeds. Unsalted canned beans.  Dairy Low-fat dairy products, such as skim or 1% milk, 2% or reduced-fat cheeses, low-fat ricotta or cottage cheese, or plain low-fat yogurt. Low-sodium or reduced-sodium cheeses.  Fats and Oils Tub margarines without trans fats. Light or reduced-fat mayonnaise and salad dressings (reduced sodium). Avocado. Safflower, olive, or canola oils. Natural peanut or almond butter.  Other Unsalted popcorn and pretzels. The items listed above may not be a complete list of recommended foods or beverages. Contact your dietitian for more options.  +++++++++++++++  WHAT FOODS ARE NOT RECOMMENDED? Grains/ White flour or wheat flour White bread. White pasta. White rice. Refined cornbread. Bagels and croissants. Crackers  that contain trans fat.  Vegetables  Creamed or fried vegetables. Vegetables in a . Regular canned vegetables. Regular canned tomato sauce and paste. Regular tomato and vegetable juices.  Fruits Dried fruits. Canned fruit in light or heavy syrup. Fruit juice.  Meat and Other Protein Products Meat in general - RED meat & White meat.  Fatty cuts of meat. Ribs, chicken wings, all processed meats as bacon, sausage, bologna, salami, fatback, hot dogs, bratwurst and packaged luncheon meats.  Dairy Whole or 2% milk, cream, half-and-half, and cream cheese. Whole-fat or sweetened yogurt. Full-fat cheeses or blue cheese. Non-dairy creamers and whipped toppings. Processed cheese, cheese spreads, or cheese curds.  Condiments Onion and garlic salt, seasoned salt, table salt, and sea salt. Canned and packaged gravies. Worcestershire sauce. Tartar sauce. Barbecue sauce. Teriyaki sauce. Soy sauce, including reduced sodium. Steak sauce. Fish sauce. Oyster sauce. Cocktail sauce. Horseradish. Ketchup and mustard. Meat flavorings and tenderizers. Bouillon cubes. Hot  sauce. Tabasco sauce. Marinades. Taco seasonings. Relishes.  Fats and Oils Butter, stick margarine, lard, shortening and bacon fat. Coconut, palm kernel, or palm oils. Regular salad dressings.  Pickles and olives. Salted popcorn and pretzels.  The items listed above may not be a complete list of foods and beverages to avoid.

## 2019-02-26 ENCOUNTER — Other Ambulatory Visit: Payer: Self-pay | Admitting: Physician Assistant

## 2019-02-26 DIAGNOSIS — I2723 Pulmonary hypertension due to lung diseases and hypoxia: Secondary | ICD-10-CM | POA: Diagnosis not present

## 2019-02-26 DIAGNOSIS — M810 Age-related osteoporosis without current pathological fracture: Secondary | ICD-10-CM | POA: Diagnosis not present

## 2019-02-26 DIAGNOSIS — N183 Chronic kidney disease, stage 3 (moderate): Secondary | ICD-10-CM | POA: Diagnosis not present

## 2019-02-26 DIAGNOSIS — I110 Hypertensive heart disease with heart failure: Secondary | ICD-10-CM | POA: Diagnosis not present

## 2019-02-26 DIAGNOSIS — I351 Nonrheumatic aortic (valve) insufficiency: Secondary | ICD-10-CM | POA: Diagnosis not present

## 2019-02-26 DIAGNOSIS — J9611 Chronic respiratory failure with hypoxia: Secondary | ICD-10-CM | POA: Diagnosis not present

## 2019-02-26 DIAGNOSIS — E559 Vitamin D deficiency, unspecified: Secondary | ICD-10-CM | POA: Diagnosis not present

## 2019-02-26 DIAGNOSIS — I480 Paroxysmal atrial fibrillation: Secondary | ICD-10-CM | POA: Diagnosis not present

## 2019-02-26 DIAGNOSIS — J439 Emphysema, unspecified: Secondary | ICD-10-CM | POA: Diagnosis not present

## 2019-02-26 DIAGNOSIS — E039 Hypothyroidism, unspecified: Secondary | ICD-10-CM | POA: Diagnosis not present

## 2019-02-26 DIAGNOSIS — Z7901 Long term (current) use of anticoagulants: Secondary | ICD-10-CM | POA: Diagnosis not present

## 2019-02-26 DIAGNOSIS — Z9981 Dependence on supplemental oxygen: Secondary | ICD-10-CM | POA: Diagnosis not present

## 2019-02-26 DIAGNOSIS — M519 Unspecified thoracic, thoracolumbar and lumbosacral intervertebral disc disorder: Secondary | ICD-10-CM | POA: Diagnosis not present

## 2019-02-26 DIAGNOSIS — I5032 Chronic diastolic (congestive) heart failure: Secondary | ICD-10-CM | POA: Diagnosis not present

## 2019-02-26 DIAGNOSIS — E43 Unspecified severe protein-calorie malnutrition: Secondary | ICD-10-CM | POA: Diagnosis not present

## 2019-02-26 DIAGNOSIS — Z4800 Encounter for change or removal of nonsurgical wound dressing: Secondary | ICD-10-CM | POA: Diagnosis not present

## 2019-02-26 DIAGNOSIS — E785 Hyperlipidemia, unspecified: Secondary | ICD-10-CM | POA: Diagnosis not present

## 2019-02-27 NOTE — Progress Notes (Signed)
FOLLOW UP AND CCM  Chronic diastolic congestive heart failure (HCC) Monitor weight daily continue lasix once a day but will increase to 2 pills a day if the swelling is worse, more than 5 lbs in a day gain.   Chronic respiratory failure with hypoxia (HCC) Counseled on wearing portable O2 while walking Take deep breaths Stay inside with worsening allergies  Bilateral lower extremity edema Improved, will continue lasix once a day but will increase to 2 pills a day if the swelling is worse, more than 5 lbs in a day gain.  Will do compression socks- given information on where to go get them Continue PT Continue elevation Likely secondary to diastolic heart failure and right sided heart failure  Moderate protein-calorie malnutrition (HCC) -     CBC with Differential/Platelet -     COMPLETE METABOLIC PANEL WITH GFR -     TSH  CKD (chronic kidney disease) stage 3, GFR 30-59 ml/min (HCC) -     CBC with Differential/Platelet -     COMPLETE METABOLIC PANEL WITH GFR Increase fluids, avoid NSAIDS, monitor sugars, will monitor  Pulmonary hypertension due to COPD ([place]) Counseled on wearing portable O2 while walking  Abnormal urinalysis -     Urinalysis, Routine w reflex microscopic -     Urine Culture - recheck  Medication management -     Magnesium  Enrolled in chronic care management Goals    . Patient Stated     Get compression stockings    . Patient Stated     Wear O2 while walking    . Record weight daily        Comprehensive care plan:  Patient/caregiver was given comprehensive care plan We will continue to monitor these goals every 3 months with an office visit and every month by a telephone call  Patient can contact the office any time with the phone number and get to a provider via the answering service or they can use Mychart.   Verbal permission was received from the patient to review comprehensive care management, they understand they have the right to stop  CCM services at any time.   The patient is self managing medications at home, daughter will help occ.  Medications were reviewed with the patient today as well as adherence and potential interactions.   The patient does need home health services at this time.   Further disposition pending results of labs. Discussed med's effects and SE's.   Over 30 minutes of exam, counseling, chart review, and critical decision making was performed.   Future Appointments  Date Time Provider [place]  06/22/2019 11:00 AM Lelon Perla, MD [facility]  09/18/2019  2:00 PM Liane Comber, NP [facility] None  03/13/2020  3:00 PM Vicie Mutters, PA-C [facility] None    ------------------------------------------------------------------------------------------------------------------   HPI 85 y.o.female with history of COPD, malnutrition, bronchiectasis, HTN presents for close follow up.  She presented with BLE on 05/19 with lower extremity wound ulcer. Home health was involved, has PT coming to the house. Her lasix was increased to 34m BID and now she is on one a day but she will take 2 a day if she has swelling.  She had a UTI a month ago, will recheck today.  She has not had diarrhea x 1 month.    Lab Results  Component Value Date   CREATININE 1.24 (H) 01/29/2019   BUN 42 (H) 01/29/2019   NA 141 01/29/2019   K 3.7 01/29/2019  CL 101 01/29/2019   CO2 34 (H) 01/29/2019     She has portable O2, not wearing it into the office. She had to stop and rest walking into the office. She was 81 when she first sat down and went up to 92 on RA after resting for 10-15 mins. She does get some shortness of breath with talking.   She is alert and oriented, she is hard of hearing.  She appears thin and frail.  She is a poor historian and is alone at the appointment today.  Her husband drove her.  She ambulates with a cane and becomes out of breath with walking for long distances.    With  must discussion patient is agreeable for [place] to contact her daughter [name] to relay the treatment plan from the appointment today. Concern for some self care, medication management, and ability to relay outcome of appointment today.  BMI is Body mass index is 17.4 kg/m., she is on boost/ensure, drinks 1 a day.  Wt Readings from Last 3 Encounters:  02/28/19 98 lb 3.2 oz (44.5 kg)  02/13/19 99 lb 12.8 oz (45.3 kg)  01/29/19 97 lb (44 kg)     Past Medical History:  Diagnosis Date  . Aortic regurgitation   . Bronchiectasis ([place])   . COPD (chronic obstructive pulmonary disease) ([place])   . DJD (degenerative joint disease)   . HLD (hyperlipidemia)   . HTN (hypertension)   . Hypothyroidism   . Palpitations    a. has known history of PAC's and PVC's. b. 09/2015: monitor showed sinus rhythm with occasional PAC's and a brief episode of PAT.  . S/P thoracentesis      Allergies  Allergen Reactions  . Vancomycin     Worsening kidney function  . Sulfa Antibiotics Palpitations    Unknown    Current Outpatient Medications on File Prior to Visit  Medication Sig  . acetaZOLAMIDE (DIAMOX) 250 MG tablet TAKE ONE TABLET (250MG TOTAL) BY MOUTH DAILY  . ALPRAZolam (XANAX) 0.5 MG tablet TAKE ONE-HALF TABLET TO ONE TABLET BY MOUTH TWO TO THREE TIMES DAILY FOR NERVES OR SLEEP. MAY CAUSE DROWSINESS.  [name] apixaban (ELIQUIS) 2.5 MG TABS tablet Take 1 tablet (2.5 mg total) by mouth 2 (two) times daily.  . cefUROXime (CEFTIN) 250 MG tablet Take 1 tablet (250 mg total) by mouth 2 (two) times daily.  . Cholecalciferol 50 MCG (2000 UT) CAPS Take 1 capsule (2,000 Units total) by mouth daily.  . furosemide (LASIX) 40 MG tablet TAKE ONE TABLET (40MG TOTAL) BY MOUTH TWICE DAILY  . ipratropium-albuterol (DUONEB) 0.5-2.5 (3) MG/3ML SOLN Take 3 mLs by nebulization QID.  [name] levothyroxine (SYNTHROID, LEVOTHROID) 50 MCG tablet TAKE ONE TABLET BY MOUTH ONCE A DAY AS DIRECTED  . metoprolol tartrate (LOPRESSOR) 50 MG tablet  TAKE ONE TABLET (50MG TOTAL) BY MOUTH TWICE DAILY  . potassium chloride (K-DUR) 10 MEQ tablet Take 1 tablet (10 mEq total) by mouth daily.  . Probiotic Product (PROBIOTIC-10) CHEW Chew 2 tablets by mouth daily.  [name] Respiratory Therapy Supplies (FLUTTER) DEVI Use as directed.  . diltiazem (CARDIZEM CD) 240 MG 24 hr capsule Take 1 capsule (240 mg total) by mouth daily.   No current facility-administered medications on file prior to visit.     ROS: all negative except above.   Physical Exam:  BP 110/80   Pulse 61   Temp (!) 97.5 F (36.4 C)   Ht _0  (1.6 m)   Wt 98 lb 3.2  oz (44.5 kg)   SpO2 (!) 81%   BMI 17.40 kg/m   General Appearance: Thin and frail, in no apparent distress. Eyes: PERRLA, EOMs, conjunctiva no swelling or erythema Sinuses: No Frontal/maxillary tenderness ENT/Mouth: Ext aud canals clear, TMs without erythema, bulging. No erythema, swelling, or exudate on post pharynx.  Tonsils not swollen or erythematous. Hard of hearing  Neck: Supple, thyroid normal.  Respiratory: Respiratory decrease diffusley without rales, rhonchi, wheezing or stridor.  Cardio: RRR with no MRGs. Brisk peripheral pulses with bilateral 1 + edema.  Abdomen: Soft, + BS.  Non tender, no guarding, rebound, hernias, masses. Lymphatics: Non tender without lymphadenopathy.  Musculoskeletal: Full ROM, 5/5 strength, normal gait.  Skin: Warm, very dry with flaking skin to upper and lower extremities. No wounds. No further  rashes, lesions, ecchymosis.  Neuro: Cranial nerves intact. Normal muscle tone, no cerebellar symptoms. Sensation intact.  Psych: Awake and oriented X 3, normal affect, Insight and Judgment appropriate.    Vicie Mutters, PA-C 4:19 PM [facility]

## 2019-02-28 ENCOUNTER — Other Ambulatory Visit: Payer: Self-pay

## 2019-02-28 ENCOUNTER — Encounter: Payer: Self-pay | Admitting: Physician Assistant

## 2019-02-28 ENCOUNTER — Ambulatory Visit (INDEPENDENT_AMBULATORY_CARE_PROVIDER_SITE_OTHER): Payer: Medicare Other | Admitting: Physician Assistant

## 2019-02-28 VITALS — BP 110/80 | HR 61 | Temp 97.5°F | Ht 63.0 in | Wt 98.2 lb

## 2019-02-28 DIAGNOSIS — I5032 Chronic diastolic (congestive) heart failure: Secondary | ICD-10-CM

## 2019-02-28 DIAGNOSIS — J449 Chronic obstructive pulmonary disease, unspecified: Secondary | ICD-10-CM | POA: Diagnosis not present

## 2019-02-28 DIAGNOSIS — E440 Moderate protein-calorie malnutrition: Secondary | ICD-10-CM

## 2019-02-28 DIAGNOSIS — N183 Chronic kidney disease, stage 3 unspecified: Secondary | ICD-10-CM

## 2019-02-28 DIAGNOSIS — R8290 Unspecified abnormal findings in urine: Secondary | ICD-10-CM | POA: Diagnosis not present

## 2019-02-28 DIAGNOSIS — R600 Localized edema: Secondary | ICD-10-CM | POA: Diagnosis not present

## 2019-02-28 DIAGNOSIS — J9611 Chronic respiratory failure with hypoxia: Secondary | ICD-10-CM

## 2019-02-28 DIAGNOSIS — I2723 Pulmonary hypertension due to lung diseases and hypoxia: Secondary | ICD-10-CM | POA: Diagnosis not present

## 2019-02-28 DIAGNOSIS — Z789 Other specified health status: Secondary | ICD-10-CM

## 2019-02-28 DIAGNOSIS — Z79899 Other long term (current) drug therapy: Secondary | ICD-10-CM | POA: Diagnosis not present

## 2019-02-28 NOTE — Patient Instructions (Signed)
VENOUS INSUFFICIENCY Our lower leg venous system is not the most reliable, the heart does NOT pump fluid up, there is a valve system.  The muscles of the leg squeeze and the blood moves up and a valve opens and close, then they squeeze, blood moves up and valves open and closes keeping the blood moving towards the heart.  Lots can go wrong with this valve system.  If someone is sitting or standing without movement, everyone will get swelling.  THINGS TO DO:  Do not stand or sit in one position for long periods of time. Do not sit with your legs crossed. Rest with your legs raised during the day.  Your legs have to be higher than your heart so that gravity will force the valves to open, so please really elevate your legs.   Wear elastic stockings or support hose. Do not wear other tight, encircling garments around the legs, pelvis, or waist.  [employer]  has a wide variety of well priced compression stockings. [place], [place] [id number] [id number] [place] has a good cheap selection, I like the socks, they are not as hard to get on  Walk as much as possible to increase blood flow.  Raise the foot of your bed at night with 2-inch blocks.  SEEK MEDICAL CARE IF:   The skin around your ankle starts to break down.  You have pain, redness, tenderness, or hard swelling developing in your leg over a vein.  You are uncomfortable due to leg pain.  If you ever have shortness of breath with exertion or chest pain go to the ER.   Chronic Care Management  This is your comprehensive care plan.  We will continue to monitor these chronic health issues and see if we are achieving our goals every 3 months or more.  You can contact the office any time with the office phone number and get to a provider via the answering service or you can use Mychart, if you are not signed up they can do so at the front office.  Here is some information that we discussed and went over below.     These are the goals we discussed: Goals (1 Years of Data) as of 02/28/2019 at 3:39 PM      General   . Patient Stated     Note created  02/28/2019  3:39 PM by Vicie Mutters, PA-C    Get compression stockings    . Patient Stated     Note created  02/28/2019  3:39 PM by Vicie Mutters, PA-C    Wear O2 while walking      Weight   . Record weight daily        These are your follow up appointments: Future Appointments  Date Time Provider [place]  06/22/2019 11:00 AM Lelon Perla, MD [facility]  09/18/2019  2:00 PM Liane Comber, NP [facility] None

## 2019-03-01 DIAGNOSIS — I351 Nonrheumatic aortic (valve) insufficiency: Secondary | ICD-10-CM | POA: Diagnosis not present

## 2019-03-01 DIAGNOSIS — M519 Unspecified thoracic, thoracolumbar and lumbosacral intervertebral disc disorder: Secondary | ICD-10-CM | POA: Diagnosis not present

## 2019-03-01 DIAGNOSIS — E785 Hyperlipidemia, unspecified: Secondary | ICD-10-CM | POA: Diagnosis not present

## 2019-03-01 DIAGNOSIS — J439 Emphysema, unspecified: Secondary | ICD-10-CM | POA: Diagnosis not present

## 2019-03-01 DIAGNOSIS — Z7901 Long term (current) use of anticoagulants: Secondary | ICD-10-CM | POA: Diagnosis not present

## 2019-03-01 DIAGNOSIS — E039 Hypothyroidism, unspecified: Secondary | ICD-10-CM | POA: Diagnosis not present

## 2019-03-01 DIAGNOSIS — I110 Hypertensive heart disease with heart failure: Secondary | ICD-10-CM | POA: Diagnosis not present

## 2019-03-01 DIAGNOSIS — I5032 Chronic diastolic (congestive) heart failure: Secondary | ICD-10-CM | POA: Diagnosis not present

## 2019-03-01 DIAGNOSIS — I2723 Pulmonary hypertension due to lung diseases and hypoxia: Secondary | ICD-10-CM | POA: Diagnosis not present

## 2019-03-01 DIAGNOSIS — E559 Vitamin D deficiency, unspecified: Secondary | ICD-10-CM | POA: Diagnosis not present

## 2019-03-01 DIAGNOSIS — M810 Age-related osteoporosis without current pathological fracture: Secondary | ICD-10-CM | POA: Diagnosis not present

## 2019-03-01 DIAGNOSIS — J9611 Chronic respiratory failure with hypoxia: Secondary | ICD-10-CM | POA: Diagnosis not present

## 2019-03-01 DIAGNOSIS — Z4800 Encounter for change or removal of nonsurgical wound dressing: Secondary | ICD-10-CM | POA: Diagnosis not present

## 2019-03-01 DIAGNOSIS — E43 Unspecified severe protein-calorie malnutrition: Secondary | ICD-10-CM | POA: Diagnosis not present

## 2019-03-01 DIAGNOSIS — I480 Paroxysmal atrial fibrillation: Secondary | ICD-10-CM | POA: Diagnosis not present

## 2019-03-01 DIAGNOSIS — Z9981 Dependence on supplemental oxygen: Secondary | ICD-10-CM | POA: Diagnosis not present

## 2019-03-01 DIAGNOSIS — N183 Chronic kidney disease, stage 3 (moderate): Secondary | ICD-10-CM | POA: Diagnosis not present

## 2019-03-01 LAB — URINALYSIS, ROUTINE W REFLEX MICROSCOPIC
Bacteria, UA: NONE SEEN /HPF
Bilirubin Urine: NEGATIVE
Glucose, UA: NEGATIVE
Hgb urine dipstick: NEGATIVE
Hyaline Cast: NONE SEEN /LPF
Ketones, ur: NEGATIVE
Nitrite: NEGATIVE
RBC / HPF: NONE SEEN /HPF (ref 0–2)
Specific Gravity, Urine: 1.015 (ref 1.001–1.03)
pH: 8 (ref 5.0–8.0)

## 2019-03-01 LAB — COMPLETE METABOLIC PANEL WITH GFR
AG Ratio: 1.1 (calc) (ref 1.0–2.5)
ALT: 9 U/L (ref 6–29)
AST: 17 U/L (ref 10–35)
Albumin: 4 g/dL (ref 3.6–5.1)
Alkaline phosphatase (APISO): 74 U/L (ref 37–153)
BUN/Creatinine Ratio: 25 (calc) — ABNORMAL HIGH (ref 6–22)
BUN: 29 mg/dL — ABNORMAL HIGH (ref 7–25)
CO2: 31 mmol/L (ref 20–32)
Calcium: 9.5 mg/dL (ref 8.6–10.4)
Chloride: 101 mmol/L (ref 98–110)
Creat: 1.18 mg/dL — ABNORMAL HIGH (ref 0.60–0.88)
GFR, Est African American: 48 mL/min/{1.73_m2} — ABNORMAL LOW (ref >=60)
GFR, Est Non African American: 41 mL/min/{1.73_m2} — ABNORMAL LOW (ref >=60)
Globulin: 3.8 g/dL (calc) — ABNORMAL HIGH (ref 1.9–3.7)
Glucose, Bld: 100 mg/dL — ABNORMAL HIGH (ref 65–99)
Potassium: 4.4 mmol/L (ref 3.5–5.3)
Sodium: 142 mmol/L (ref 135–146)
Total Bilirubin: 0.5 mg/dL (ref 0.2–1.2)
Total Protein: 7.8 g/dL (ref 6.1–8.1)

## 2019-03-01 LAB — CBC WITH DIFFERENTIAL/PLATELET
Absolute Monocytes: 624 cells/uL (ref 200–950)
Basophils Absolute: 32 cells/uL (ref 0–200)
Basophils Relative: 0.4 %
Eosinophils Absolute: 120 cells/uL (ref 15–500)
Eosinophils Relative: 1.5 %
HCT: 41.1 % (ref 35.0–45.0)
Hemoglobin: 12.8 g/dL (ref 11.7–15.5)
Lymphs Abs: 2008 cells/uL (ref 850–3900)
MCH: 30.3 pg (ref 27.0–33.0)
MCHC: 31.1 g/dL — ABNORMAL LOW (ref 32.0–36.0)
MCV: 97.2 fL (ref 80.0–100.0)
MPV: 12.4 fL (ref 7.5–12.5)
Monocytes Relative: 7.8 %
Neutro Abs: 5216 cells/uL (ref 1500–7800)
Neutrophils Relative %: 65.2 %
Platelets: 239 10*3/uL (ref 140–400)
RBC: 4.23 10*6/uL (ref 3.80–5.10)
RDW: 11.6 % (ref 11.0–15.0)
Total Lymphocyte: 25.1 %
WBC: 8 10*3/uL (ref 3.8–10.8)

## 2019-03-01 LAB — URINE CULTURE
MICRO NUMBER:: 533878
SPECIMEN QUALITY:: ADEQUATE

## 2019-03-01 LAB — TSH: TSH: 3.11 mIU/L (ref 0.40–4.50)

## 2019-03-01 LAB — MAGNESIUM: Magnesium: 2.2 mg/dL (ref 1.5–2.5)

## 2019-03-02 DIAGNOSIS — I110 Hypertensive heart disease with heart failure: Secondary | ICD-10-CM | POA: Diagnosis not present

## 2019-03-02 DIAGNOSIS — J9611 Chronic respiratory failure with hypoxia: Secondary | ICD-10-CM | POA: Diagnosis not present

## 2019-03-02 DIAGNOSIS — I351 Nonrheumatic aortic (valve) insufficiency: Secondary | ICD-10-CM | POA: Diagnosis not present

## 2019-03-02 DIAGNOSIS — M810 Age-related osteoporosis without current pathological fracture: Secondary | ICD-10-CM | POA: Diagnosis not present

## 2019-03-02 DIAGNOSIS — N183 Chronic kidney disease, stage 3 (moderate): Secondary | ICD-10-CM | POA: Diagnosis not present

## 2019-03-02 DIAGNOSIS — M519 Unspecified thoracic, thoracolumbar and lumbosacral intervertebral disc disorder: Secondary | ICD-10-CM | POA: Diagnosis not present

## 2019-03-02 DIAGNOSIS — E43 Unspecified severe protein-calorie malnutrition: Secondary | ICD-10-CM | POA: Diagnosis not present

## 2019-03-02 DIAGNOSIS — Z7901 Long term (current) use of anticoagulants: Secondary | ICD-10-CM | POA: Diagnosis not present

## 2019-03-02 DIAGNOSIS — E039 Hypothyroidism, unspecified: Secondary | ICD-10-CM | POA: Diagnosis not present

## 2019-03-02 DIAGNOSIS — I2723 Pulmonary hypertension due to lung diseases and hypoxia: Secondary | ICD-10-CM | POA: Diagnosis not present

## 2019-03-02 DIAGNOSIS — Z9981 Dependence on supplemental oxygen: Secondary | ICD-10-CM | POA: Diagnosis not present

## 2019-03-02 DIAGNOSIS — E785 Hyperlipidemia, unspecified: Secondary | ICD-10-CM | POA: Diagnosis not present

## 2019-03-02 DIAGNOSIS — I5032 Chronic diastolic (congestive) heart failure: Secondary | ICD-10-CM | POA: Diagnosis not present

## 2019-03-02 DIAGNOSIS — Z4800 Encounter for change or removal of nonsurgical wound dressing: Secondary | ICD-10-CM | POA: Diagnosis not present

## 2019-03-02 DIAGNOSIS — J439 Emphysema, unspecified: Secondary | ICD-10-CM | POA: Diagnosis not present

## 2019-03-02 DIAGNOSIS — E559 Vitamin D deficiency, unspecified: Secondary | ICD-10-CM | POA: Diagnosis not present

## 2019-03-02 DIAGNOSIS — I480 Paroxysmal atrial fibrillation: Secondary | ICD-10-CM | POA: Diagnosis not present

## 2019-03-05 ENCOUNTER — Telehealth: Payer: Self-pay

## 2019-03-05 NOTE — Telephone Encounter (Signed)
Spoke to patient on June 8th 2020 at 12:22pm.  Patient reports that her DIARRHEA has cleared up since she reported having diarrhea.  Patient was informed to call with any other concerns/issues/questions.   Patient voiced & agreed to this information.

## 2019-03-05 NOTE — Telephone Encounter (Signed)
-----  Message from Vicie Mutters, [place] sent at 03/05/2019  8:27 AM EDT ----- Regarding: RE: diarrhea Contact: [phone] Please check to see if this has improved, get on probiotic. If not better do telephone visit.  Estill Bamberg ----- Message ----- From: Elenor Quinones, CMA Sent: 03/01/2019   3:50 PM EDT To: Vicie Mutters, PA-C Subject: diarrhea                                       Diarrhea @ 4:44am & has had all day  Patient was told to inform the provider.   Per office note:

## 2019-03-06 ENCOUNTER — Telehealth: Payer: Self-pay

## 2019-03-06 DIAGNOSIS — J439 Emphysema, unspecified: Secondary | ICD-10-CM | POA: Diagnosis not present

## 2019-03-06 DIAGNOSIS — E43 Unspecified severe protein-calorie malnutrition: Secondary | ICD-10-CM | POA: Diagnosis not present

## 2019-03-06 DIAGNOSIS — E785 Hyperlipidemia, unspecified: Secondary | ICD-10-CM | POA: Diagnosis not present

## 2019-03-06 DIAGNOSIS — I351 Nonrheumatic aortic (valve) insufficiency: Secondary | ICD-10-CM | POA: Diagnosis not present

## 2019-03-06 DIAGNOSIS — E559 Vitamin D deficiency, unspecified: Secondary | ICD-10-CM | POA: Diagnosis not present

## 2019-03-06 DIAGNOSIS — N183 Chronic kidney disease, stage 3 (moderate): Secondary | ICD-10-CM | POA: Diagnosis not present

## 2019-03-06 DIAGNOSIS — E039 Hypothyroidism, unspecified: Secondary | ICD-10-CM | POA: Diagnosis not present

## 2019-03-06 DIAGNOSIS — J9611 Chronic respiratory failure with hypoxia: Secondary | ICD-10-CM | POA: Diagnosis not present

## 2019-03-06 DIAGNOSIS — Z9981 Dependence on supplemental oxygen: Secondary | ICD-10-CM | POA: Diagnosis not present

## 2019-03-06 DIAGNOSIS — M519 Unspecified thoracic, thoracolumbar and lumbosacral intervertebral disc disorder: Secondary | ICD-10-CM | POA: Diagnosis not present

## 2019-03-06 DIAGNOSIS — I5032 Chronic diastolic (congestive) heart failure: Secondary | ICD-10-CM | POA: Diagnosis not present

## 2019-03-06 DIAGNOSIS — M810 Age-related osteoporosis without current pathological fracture: Secondary | ICD-10-CM | POA: Diagnosis not present

## 2019-03-06 DIAGNOSIS — Z7901 Long term (current) use of anticoagulants: Secondary | ICD-10-CM | POA: Diagnosis not present

## 2019-03-06 DIAGNOSIS — Z4800 Encounter for change or removal of nonsurgical wound dressing: Secondary | ICD-10-CM | POA: Diagnosis not present

## 2019-03-06 DIAGNOSIS — I480 Paroxysmal atrial fibrillation: Secondary | ICD-10-CM | POA: Diagnosis not present

## 2019-03-06 DIAGNOSIS — I110 Hypertensive heart disease with heart failure: Secondary | ICD-10-CM | POA: Diagnosis not present

## 2019-03-06 DIAGNOSIS — I2723 Pulmonary hypertension due to lung diseases and hypoxia: Secondary | ICD-10-CM | POA: Diagnosis not present

## 2019-03-06 NOTE — Telephone Encounter (Signed)
Patient reports diarrhea from 5 am to 12:30 pm & after then diarrhea went away.  After speaking with a provider: patient was instructed to get an OTC IMODIUM & follow the instruction on the bottle.  Patient was instructed to call the office with any other concerns or questions.

## 2019-03-08 ENCOUNTER — Other Ambulatory Visit: Payer: Self-pay | Admitting: Internal Medicine

## 2019-03-08 ENCOUNTER — Other Ambulatory Visit: Payer: Self-pay | Admitting: Physician Assistant

## 2019-03-08 DIAGNOSIS — I2723 Pulmonary hypertension due to lung diseases and hypoxia: Secondary | ICD-10-CM | POA: Diagnosis not present

## 2019-03-08 DIAGNOSIS — Z9981 Dependence on supplemental oxygen: Secondary | ICD-10-CM | POA: Diagnosis not present

## 2019-03-08 DIAGNOSIS — Z7901 Long term (current) use of anticoagulants: Secondary | ICD-10-CM | POA: Diagnosis not present

## 2019-03-08 DIAGNOSIS — I5032 Chronic diastolic (congestive) heart failure: Secondary | ICD-10-CM | POA: Diagnosis not present

## 2019-03-08 DIAGNOSIS — E559 Vitamin D deficiency, unspecified: Secondary | ICD-10-CM | POA: Diagnosis not present

## 2019-03-08 DIAGNOSIS — N183 Chronic kidney disease, stage 3 (moderate): Secondary | ICD-10-CM | POA: Diagnosis not present

## 2019-03-08 DIAGNOSIS — E039 Hypothyroidism, unspecified: Secondary | ICD-10-CM | POA: Diagnosis not present

## 2019-03-08 DIAGNOSIS — J439 Emphysema, unspecified: Secondary | ICD-10-CM | POA: Diagnosis not present

## 2019-03-08 DIAGNOSIS — M810 Age-related osteoporosis without current pathological fracture: Secondary | ICD-10-CM | POA: Diagnosis not present

## 2019-03-08 DIAGNOSIS — E785 Hyperlipidemia, unspecified: Secondary | ICD-10-CM | POA: Diagnosis not present

## 2019-03-08 DIAGNOSIS — I351 Nonrheumatic aortic (valve) insufficiency: Secondary | ICD-10-CM | POA: Diagnosis not present

## 2019-03-08 DIAGNOSIS — R7981 Abnormal blood-gas level: Secondary | ICD-10-CM

## 2019-03-08 DIAGNOSIS — Z4800 Encounter for change or removal of nonsurgical wound dressing: Secondary | ICD-10-CM | POA: Diagnosis not present

## 2019-03-08 DIAGNOSIS — E43 Unspecified severe protein-calorie malnutrition: Secondary | ICD-10-CM | POA: Diagnosis not present

## 2019-03-08 DIAGNOSIS — I480 Paroxysmal atrial fibrillation: Secondary | ICD-10-CM | POA: Diagnosis not present

## 2019-03-08 DIAGNOSIS — M519 Unspecified thoracic, thoracolumbar and lumbosacral intervertebral disc disorder: Secondary | ICD-10-CM | POA: Diagnosis not present

## 2019-03-08 DIAGNOSIS — J9611 Chronic respiratory failure with hypoxia: Secondary | ICD-10-CM | POA: Diagnosis not present

## 2019-03-08 DIAGNOSIS — I110 Hypertensive heart disease with heart failure: Secondary | ICD-10-CM | POA: Diagnosis not present

## 2019-03-17 DIAGNOSIS — S81801A Unspecified open wound, right lower leg, initial encounter: Secondary | ICD-10-CM | POA: Diagnosis not present

## 2019-03-17 DIAGNOSIS — I5032 Chronic diastolic (congestive) heart failure: Secondary | ICD-10-CM | POA: Diagnosis not present

## 2019-03-17 DIAGNOSIS — J449 Chronic obstructive pulmonary disease, unspecified: Secondary | ICD-10-CM | POA: Diagnosis not present

## 2019-03-17 DIAGNOSIS — R531 Weakness: Secondary | ICD-10-CM | POA: Diagnosis not present

## 2019-03-18 ENCOUNTER — Inpatient Hospital Stay (HOSPITAL_COMMUNITY)
Admission: EM | Admit: 2019-03-18 | Discharge: 2019-03-28 | DRG: 388 | Disposition: E | Payer: Medicare Other | Attending: Internal Medicine | Admitting: Internal Medicine

## 2019-03-18 ENCOUNTER — Emergency Department (HOSPITAL_COMMUNITY): Payer: Medicare Other

## 2019-03-18 ENCOUNTER — Encounter (HOSPITAL_COMMUNITY): Payer: Self-pay

## 2019-03-18 ENCOUNTER — Other Ambulatory Visit: Payer: Self-pay

## 2019-03-18 DIAGNOSIS — I482 Chronic atrial fibrillation, unspecified: Secondary | ICD-10-CM | POA: Diagnosis present

## 2019-03-18 DIAGNOSIS — E860 Dehydration: Secondary | ICD-10-CM | POA: Diagnosis not present

## 2019-03-18 DIAGNOSIS — I2723 Pulmonary hypertension due to lung diseases and hypoxia: Secondary | ICD-10-CM | POA: Diagnosis present

## 2019-03-18 DIAGNOSIS — I959 Hypotension, unspecified: Secondary | ICD-10-CM | POA: Diagnosis not present

## 2019-03-18 DIAGNOSIS — N183 Chronic kidney disease, stage 3 unspecified: Secondary | ICD-10-CM | POA: Diagnosis present

## 2019-03-18 DIAGNOSIS — I5032 Chronic diastolic (congestive) heart failure: Secondary | ICD-10-CM | POA: Diagnosis present

## 2019-03-18 DIAGNOSIS — J438 Other emphysema: Secondary | ICD-10-CM | POA: Diagnosis not present

## 2019-03-18 DIAGNOSIS — I480 Paroxysmal atrial fibrillation: Secondary | ICD-10-CM | POA: Diagnosis not present

## 2019-03-18 DIAGNOSIS — J449 Chronic obstructive pulmonary disease, unspecified: Secondary | ICD-10-CM | POA: Diagnosis not present

## 2019-03-18 DIAGNOSIS — E039 Hypothyroidism, unspecified: Secondary | ICD-10-CM | POA: Diagnosis present

## 2019-03-18 DIAGNOSIS — Z1159 Encounter for screening for other viral diseases: Secondary | ICD-10-CM | POA: Diagnosis not present

## 2019-03-18 DIAGNOSIS — E872 Acidosis: Secondary | ICD-10-CM | POA: Diagnosis not present

## 2019-03-18 DIAGNOSIS — R000 Tachycardia, unspecified: Secondary | ICD-10-CM | POA: Diagnosis not present

## 2019-03-18 DIAGNOSIS — I130 Hypertensive heart and chronic kidney disease with heart failure and stage 1 through stage 4 chronic kidney disease, or unspecified chronic kidney disease: Secondary | ICD-10-CM | POA: Diagnosis present

## 2019-03-18 DIAGNOSIS — Z881 Allergy status to other antibiotic agents status: Secondary | ICD-10-CM

## 2019-03-18 DIAGNOSIS — R112 Nausea with vomiting, unspecified: Secondary | ICD-10-CM | POA: Diagnosis not present

## 2019-03-18 DIAGNOSIS — J9621 Acute and chronic respiratory failure with hypoxia: Secondary | ICD-10-CM | POA: Diagnosis not present

## 2019-03-18 DIAGNOSIS — J9602 Acute respiratory failure with hypercapnia: Secondary | ICD-10-CM

## 2019-03-18 DIAGNOSIS — J439 Emphysema, unspecified: Secondary | ICD-10-CM | POA: Diagnosis present

## 2019-03-18 DIAGNOSIS — I4891 Unspecified atrial fibrillation: Secondary | ICD-10-CM | POA: Diagnosis not present

## 2019-03-18 DIAGNOSIS — I351 Nonrheumatic aortic (valve) insufficiency: Secondary | ICD-10-CM | POA: Diagnosis present

## 2019-03-18 DIAGNOSIS — E785 Hyperlipidemia, unspecified: Secondary | ICD-10-CM | POA: Diagnosis not present

## 2019-03-18 DIAGNOSIS — R197 Diarrhea, unspecified: Secondary | ICD-10-CM | POA: Diagnosis not present

## 2019-03-18 DIAGNOSIS — Z7901 Long term (current) use of anticoagulants: Secondary | ICD-10-CM

## 2019-03-18 DIAGNOSIS — Z79890 Hormone replacement therapy (postmenopausal): Secondary | ICD-10-CM

## 2019-03-18 DIAGNOSIS — Z87891 Personal history of nicotine dependence: Secondary | ICD-10-CM

## 2019-03-18 DIAGNOSIS — I272 Pulmonary hypertension, unspecified: Secondary | ICD-10-CM | POA: Diagnosis present

## 2019-03-18 DIAGNOSIS — Z79899 Other long term (current) drug therapy: Secondary | ICD-10-CM

## 2019-03-18 DIAGNOSIS — J810 Acute pulmonary edema: Secondary | ICD-10-CM | POA: Diagnosis not present

## 2019-03-18 DIAGNOSIS — R64 Cachexia: Secondary | ICD-10-CM | POA: Diagnosis not present

## 2019-03-18 DIAGNOSIS — R0902 Hypoxemia: Secondary | ICD-10-CM | POA: Diagnosis not present

## 2019-03-18 DIAGNOSIS — J9611 Chronic respiratory failure with hypoxia: Secondary | ICD-10-CM | POA: Diagnosis present

## 2019-03-18 DIAGNOSIS — M810 Age-related osteoporosis without current pathological fracture: Secondary | ICD-10-CM | POA: Diagnosis present

## 2019-03-18 DIAGNOSIS — Z9981 Dependence on supplemental oxygen: Secondary | ICD-10-CM

## 2019-03-18 DIAGNOSIS — I700 Atherosclerosis of aorta: Secondary | ICD-10-CM | POA: Diagnosis not present

## 2019-03-18 DIAGNOSIS — R0600 Dyspnea, unspecified: Secondary | ICD-10-CM

## 2019-03-18 DIAGNOSIS — K56609 Unspecified intestinal obstruction, unspecified as to partial versus complete obstruction: Principal | ICD-10-CM | POA: Diagnosis present

## 2019-03-18 DIAGNOSIS — J9622 Acute and chronic respiratory failure with hypercapnia: Secondary | ICD-10-CM | POA: Diagnosis not present

## 2019-03-18 DIAGNOSIS — R1110 Vomiting, unspecified: Secondary | ICD-10-CM | POA: Diagnosis not present

## 2019-03-18 DIAGNOSIS — Z66 Do not resuscitate: Secondary | ICD-10-CM | POA: Diagnosis not present

## 2019-03-18 DIAGNOSIS — Z85828 Personal history of other malignant neoplasm of skin: Secondary | ICD-10-CM

## 2019-03-18 DIAGNOSIS — Z825 Family history of asthma and other chronic lower respiratory diseases: Secondary | ICD-10-CM

## 2019-03-18 DIAGNOSIS — S4991XA Unspecified injury of right shoulder and upper arm, initial encounter: Secondary | ICD-10-CM | POA: Diagnosis not present

## 2019-03-18 DIAGNOSIS — Z681 Body mass index (BMI) 19 or less, adult: Secondary | ICD-10-CM

## 2019-03-18 DIAGNOSIS — Z90710 Acquired absence of both cervix and uterus: Secondary | ICD-10-CM

## 2019-03-18 DIAGNOSIS — Z809 Family history of malignant neoplasm, unspecified: Secondary | ICD-10-CM

## 2019-03-18 DIAGNOSIS — J9601 Acute respiratory failure with hypoxia: Secondary | ICD-10-CM

## 2019-03-18 DIAGNOSIS — Z882 Allergy status to sulfonamides status: Secondary | ICD-10-CM

## 2019-03-18 LAB — LIPASE, BLOOD: Lipase: 27 U/L (ref 11–51)

## 2019-03-18 LAB — CBC WITH DIFFERENTIAL/PLATELET
Abs Immature Granulocytes: 0.06 10*3/uL (ref 0.00–0.07)
Basophils Absolute: 0 10*3/uL (ref 0.0–0.1)
Basophils Relative: 0 %
Eosinophils Absolute: 0 10*3/uL (ref 0.0–0.5)
Eosinophils Relative: 0 %
HCT: 43.6 % (ref 36.0–46.0)
Hemoglobin: 12.5 g/dL (ref 12.0–15.0)
Immature Granulocytes: 0 %
Lymphocytes Relative: 5 %
Lymphs Abs: 0.8 10*3/uL (ref 0.7–4.0)
MCH: 29.8 pg (ref 26.0–34.0)
MCHC: 28.7 g/dL — ABNORMAL LOW (ref 30.0–36.0)
MCV: 104.1 fL — ABNORMAL HIGH (ref 80.0–100.0)
Monocytes Absolute: 1.1 10*3/uL — ABNORMAL HIGH (ref 0.1–1.0)
Monocytes Relative: 6 %
Neutro Abs: 14.9 10*3/uL — ABNORMAL HIGH (ref 1.7–7.7)
Neutrophils Relative %: 89 %
Platelets: 184 10*3/uL (ref 150–400)
RBC: 4.19 MIL/uL (ref 3.87–5.11)
RDW: 12.5 % (ref 11.5–15.5)
WBC: 16.8 10*3/uL — ABNORMAL HIGH (ref 4.0–10.5)
nRBC: 0 % (ref 0.0–0.2)

## 2019-03-18 LAB — COMPREHENSIVE METABOLIC PANEL
ALT: 10 U/L (ref 0–44)
AST: 16 U/L (ref 15–41)
Albumin: 3.3 g/dL — ABNORMAL LOW (ref 3.5–5.0)
Alkaline Phosphatase: 70 U/L (ref 38–126)
Anion gap: 10 (ref 5–15)
BUN: 29 mg/dL — ABNORMAL HIGH (ref 8–23)
CO2: 29 mmol/L (ref 22–32)
Calcium: 8.7 mg/dL — ABNORMAL LOW (ref 8.9–10.3)
Chloride: 101 mmol/L (ref 98–111)
Creatinine, Ser: 0.98 mg/dL (ref 0.44–1.00)
GFR calc Af Amer: 60 mL/min — ABNORMAL LOW (ref >60)
GFR calc non Af Amer: 52 mL/min — ABNORMAL LOW (ref >60)
Glucose, Bld: 134 mg/dL — ABNORMAL HIGH (ref 70–99)
Potassium: 3.4 mmol/L — ABNORMAL LOW (ref 3.5–5.1)
Sodium: 140 mmol/L (ref 135–145)
Total Bilirubin: 0.4 mg/dL (ref 0.3–1.2)
Total Protein: 7 g/dL (ref 6.5–8.1)

## 2019-03-18 MED ORDER — IOHEXOL 300 MG/ML  SOLN
100.0000 mL | Freq: Once | INTRAMUSCULAR | Status: AC | PRN
  Administered 2019-03-18: 100 mL via INTRAVENOUS

## 2019-03-18 MED ORDER — IOHEXOL 300 MG/ML  SOLN
30.0000 mL | Freq: Once | INTRAMUSCULAR | Status: AC | PRN
  Administered 2019-03-18: 23:00:00 30 mL via ORAL

## 2019-03-18 MED ORDER — SODIUM CHLORIDE 0.9 % IV BOLUS
500.0000 mL | Freq: Once | INTRAVENOUS | Status: AC
  Administered 2019-03-18: 20:00:00 500 mL via INTRAVENOUS

## 2019-03-18 MED ORDER — SODIUM CHLORIDE (PF) 0.9 % IJ SOLN
INTRAMUSCULAR | Status: AC
  Filled 2019-03-18: qty 50

## 2019-03-18 MED ORDER — SODIUM CHLORIDE 0.9 % IV SOLN
INTRAVENOUS | Status: DC
  Administered 2019-03-18: 20:00:00 via INTRAVENOUS

## 2019-03-18 NOTE — ED Provider Notes (Signed)
[facility] DEPT Provider Note   CSN: [id number] Arrival date & time: 02/26/2019  1812    History   Chief Complaint Chief Complaint  Patient presents with  . Nausea  . Emesis  . Diarrhea    HPI Andrea [name] is a 85 y.o. female.     HPI Patient presents the emergency room for evaluation of vomiting and diarrhea.  The patient was at a barbecue yesterday with her family.  Other people at the barbecue ate the same food and have not had any symptoms.  Patient started having vomiting and diarrhea today and has had it all day long.  Patient states she has had too numerous to count episodes of diarrhea.  Has had several episodes of vomiting but that has eased off.  Last time she had diarrhea was a couple of hours ago.  She is not sure but she may have been on antibiotics recently.  She denies any abdominal pain.  No fevers.  Patient is concerned that she is getting dehydrated.  She feels weaker but has not been able to keep anything down the entire day. Past Medical History:  Diagnosis Date  . Aortic regurgitation   . Bronchiectasis ([place])   . COPD (chronic obstructive pulmonary disease) ([place])   . DJD (degenerative joint disease)   . HLD (hyperlipidemia)   . HTN (hypertension)   . Hypothyroidism   . Palpitations    a. has known history of PAC's and PVC's. b. 09/2015: monitor showed sinus rhythm with occasional PAC's and a brief episode of PAT.  . S/P thoracentesis     Patient Active Problem List   Diagnosis Date Noted  . Enrolled in chronic care management 02/28/2019  . Left hip pain 11/21/2017  . Recurrent left pleural effusion   . Bronchiectasis ([place]) 09/14/2017  . Congestive heart failure ([place])   . Goals of care, counseling/discussion   . DNR (do not resuscitate) discussion   . History of small bowel obstruction 09/13/2017  . Aortic atherosclerosis ([place]) 06/22/2017  . Anterior basement membrane dystrophy 04/27/2017  . Anterior corneal  dystrophy 04/27/2017  . Protein-calorie malnutrition, severe ([place]) 04/08/2017  . AF (paroxysmal atrial fibrillation) ([place]) 04/03/2017  . Atrial tachycardia ([place]) 04/02/2017  . Chronic respiratory failure with hypoxia ([place]) 03/31/2017  . Pulmonary hypertension due to COPD ([place]) 10/04/2016  . CKD (chronic kidney disease) stage 3, GFR 30-59 ml/min (HCC) 04/19/2016  . Skin cancer 09/25/2015  . Hypothyroidism 09/25/2015  . Osteoporosis 09/25/2015  . Vitamin D deficiency 01/23/2014  . Medication management 01/23/2014  . Hyperlipidemia 08/10/2013  . Abnormal glucose 08/10/2013  . Bilateral dry eyes 09/08/2011  . Exposure keratopathy 09/08/2011  . DJD (degenerative joint disease)   . Essential hypertension 05/08/2010  . COPD with emphysema ([place]) 05/08/2010  . Lung nodules 05/08/2010    Past Surgical History:  Procedure Laterality Date  . ABDOMINAL HYSTERECTOMY  1993  . CATARACT EXTRACTION, BILATERAL    . WOUND DEBRIDEMENT Right 06/29/2017   Procedure: RIGHT LOWER  LEG DEBRIDEMENT;  Surgeon: [name], MD;  Location: [facility];  Service: General;  Laterality: Right;     OB History   No obstetric history on file.      Home Medications    Prior to Admission medications   Medication Sig Start Date End Date Taking? Authorizing Provider  acetaZOLAMIDE (DIAMOX) 250 MG tablet TAKE ONE TABLET (250MG TOTAL) BY MOUTH DAILY 03/08/19   Unk Pinto, MD  ALPRAZolam Duanne Moron) 0.5 MG tablet  TAKE ONE-HALF TABLET TO ONE TABLET BY MOUTH TWO TO THREE TIMES DAILY FOR NERVES OR SLEEP. MAY CAUSE DROWSINESS. 02/27/19   Vicie Mutters, PA-C  apixaban (ELIQUIS) 2.5 MG TABS tablet Take 1 tablet (2.5 mg total) by mouth 2 (two) times daily. 11/15/17   Lelon Perla, MD  cefUROXime (CEFTIN) 250 MG tablet Take 1 tablet (250 mg total) by mouth 2 (two) times daily. 02/01/19   Vicie Mutters, PA-C  Cholecalciferol 50 MCG (2000 UT) CAPS Take 1 capsule (2,000 Units total) by mouth daily. 09/15/18   Liane Comber, NP  diltiazem (CARDIZEM CD) 240 MG 24 hr capsule Take 1 capsule (240 mg total) by mouth daily. 08/14/18 02/13/19  Lelon Perla, MD  furosemide (LASIX) 40 MG tablet TAKE ONE TABLET (40MG TOTAL) BY MOUTH TWICE DAILY 12/20/18   Vicie Mutters, PA-C  ipratropium-albuterol (DUONEB) 0.5-2.5 (3) MG/3ML SOLN Take 3 mLs by nebulization QID. 02/15/18   Unk Pinto, MD  levothyroxine (SYNTHROID, LEVOTHROID) 50 MCG tablet TAKE ONE TABLET BY MOUTH ONCE A DAY AS DIRECTED 11/06/18   Unk Pinto, MD  metoprolol tartrate (LOPRESSOR) 50 MG tablet TAKE ONE TABLET (50MG TOTAL) BY MOUTH TWICE DAILY 11/30/18   Unk Pinto, MD  potassium chloride (K-DUR) 10 MEQ tablet TAKE ONE TABLET (10MEQ TOTAL) BY MOUTH DAILY 03/08/19   Almyra Deforest, PA  Probiotic Product (PROBIOTIC-10) CHEW Chew 2 tablets by mouth daily.    [provider]  Respiratory Therapy Supplies (FLUTTER) DEVI Use as directed. 10/19/17   Parrett, Fonnie Mu, NP    Family History Family History  Problem Relation Age of Onset  . Emphysema Mother   . Diabetes Mother   . Cancer Father   . Emphysema Sister   . Emphysema Sister   . Rheumatic fever Sister   . Heart attack Sister   . Diabetes Sister   . Diabetes Brother   . Asthma Brother        as a child    Social History Social History   Tobacco Use  . Smoking status: Former Smoker    Packs/day: 1.00    Years: 20.00    Pack years: 20.00    Types: Cigarettes    Quit date: 09/27/1974    Years since quitting: 44.5  . Smokeless tobacco: Never Used  Substance Use Topics  . Alcohol use: No  . Drug use: No     Allergies   Vancomycin and Sulfa antibiotics   Review of Systems Review of Systems  All other systems reviewed and are negative.    Physical Exam Updated Vital Signs BP 119/77   Pulse (!) 114   Temp 97.9 F (36.6 C) (Oral)   Resp 16   SpO2 100%   Physical Exam Vitals signs and nursing note reviewed.  Constitutional:      General: She is not in  acute distress.    Appearance: She is well-developed. She is ill-appearing.     Comments: Frail, thin  HENT:     Head: Normocephalic and atraumatic.     Right Ear: External ear normal.     Left Ear: External ear normal.     Mouth/Throat:     Mouth: Mucous membranes are dry.  Eyes:     General: No scleral icterus.       Right eye: No discharge.        Left eye: No discharge.     Conjunctiva/sclera: Conjunctivae normal.     Comments: Eyes are sunken  Neck:  Musculoskeletal: Neck supple.     Trachea: No tracheal deviation.  Cardiovascular:     Rate and Rhythm: Normal rate and regular rhythm.  Pulmonary:     Effort: Pulmonary effort is normal. No respiratory distress.     Breath sounds: Normal breath sounds. No stridor. No wheezing or rales.  Abdominal:     General: Bowel sounds are normal. There is no distension.     Palpations: Abdomen is soft.     Tenderness: There is no abdominal tenderness. There is no guarding or rebound.  Musculoskeletal:        General: No tenderness.  Skin:    General: Skin is warm and dry.     Findings: No rash.     Comments: Decreased skin turgor  Neurological:     Cranial Nerves: No cranial nerve deficit (no facial droop, extraocular movements intact, no slurred speech).     Sensory: No sensory deficit.     Motor: No abnormal muscle tone or seizure activity.     Coordination: Coordination normal.      ED Treatments / Results  Labs (all labs ordered are listed, but only abnormal results are displayed) Labs Reviewed  COMPREHENSIVE METABOLIC PANEL - Abnormal; Notable for the following components:      Result Value   Potassium 3.4 (*)    Glucose, Bld 134 (*)    BUN 29 (*)    Calcium 8.7 (*)    Albumin 3.3 (*)    GFR calc non Af Amer 52 (*)    GFR calc Af Amer 60 (*)    All other components within normal limits  CBC WITH DIFFERENTIAL/PLATELET - Abnormal; Notable for the following components:   WBC 16.8 (*)    MCV 104.1 (*)    MCHC 28.7  (*)    Neutro Abs 14.9 (*)    Monocytes Absolute 1.1 (*)    All other components within normal limits  C DIFFICILE QUICK SCREEN W PCR REFLEX  LIPASE, BLOOD  URINALYSIS, ROUTINE W REFLEX MICROSCOPIC    EKG None  Radiology No results found.  Procedures Procedures (including critical care time)  Medications Ordered in ED Medications  sodium chloride 0.9 % bolus 500 mL (500 mLs Intravenous Bolus from Bag 03/10/2019 1941)    And  0.9 %  sodium chloride infusion ( Intravenous New Bag/Given 02/26/2019 1940)     Initial Impression / Assessment and Plan / ED Course  I have reviewed the triage vital signs and the nursing notes.  Pertinent labs & imaging results that were available during my care of the patient were reviewed by me and considered in my medical decision making (see chart for details).  Clinical Course as of Mar 17 2140  Sun Mar 18, 2019  2020 Creatinine is stable.  BUN is elevated.  White count significantly elevated.  We will proceed with CT scan for further evaluation   [JK]  2140 Patient now states her shoulder is bothering her little bit.  She is vague about whether she may have fallen recently.  I will x-ray her shoulder.   [JK]    Clinical Course User Index [JK] Dorie Rank, MD     Pt presents with vomiting and diarrhea.  Appears dehydrated on exam.  Feeling somewhat better after IV fluids.  Leukocytosis noted.   CT Scan ordered to evaluate further.  Care will turned over to oncoming team at change of shift.   Final Clinical Impressions(s) / ED Diagnoses  pending   Tomi Bamberger,  Wille Glaser, MD 03/22/2019 2142

## 2019-03-18 NOTE — ED Notes (Signed)
Pt spilled contrast on herself and bed.  Changed the bedsheets and replaced blankets with clean, dry blankets. Pt did not want me to change her clothing, which was only slightly damp in a couple of spots. Offered several times and pt still refused.

## 2019-03-18 NOTE — ED Provider Notes (Signed)
Patient signed out to me at end of shift by Dr. Dorie Rank.  Patient to ED with non-bloody V, D x 1 day. No known fever. Symptoms started after eating BBQ at a family gathering yesterday.  History of COPD on chronic O2, PAF on Eliquis, CKD, HLD, HTN. On exam, she appears dehydrated and is receiving fluids. ABdomen tender, found to have a leukocytosis of 16, so CT pending for further evaluation.   12:15 - Patient resting comfortably. Vomiting and diarrhea has stopped. There is lower abdominal distention, soft abdomen. No severe tenderness.   CT scan shows a SBO, transition point in mid to distal ileum, right hemipelvis, likely due to adhesions. Patient reports history of obstruction in the past. No perforation.   Patient's chart shows she has a DNR, patient confirms. She is on Eliquis, multiple medical problems. Will consult hospitalist for admission. NG tube placed. COVID pending. Discussed with Dr. Myna Hidalgo, [facility], who accepts for admission. Surgery to be consulted in the morning.    Charlann Lange, PA-C 03/19/19 4327    Dorie Rank, MD 03/21/19 2020

## 2019-03-18 NOTE — ED Triage Notes (Signed)
EMS reports from home, at grandsons graduation [employer] yesterday, states she "ate everything" hot dogs, baked beans, chips, cheesecake, etc, doesn't normally eat like that. N/V/D since 0500 this morning. Hx of CHF. Pt on 2lts O2 24/7  BP 134/71 HR 100 RR 18 Sp02 99 on 2ltrs  18 R forearm

## 2019-03-18 NOTE — ED Notes (Signed)
Bed: QL73 Expected date:  Expected time:  Means of arrival:  Comments: N/V/D

## 2019-03-19 ENCOUNTER — Inpatient Hospital Stay (HOSPITAL_COMMUNITY): Payer: Medicare Other

## 2019-03-19 ENCOUNTER — Observation Stay (HOSPITAL_COMMUNITY): Payer: Medicare Other

## 2019-03-19 ENCOUNTER — Encounter (HOSPITAL_COMMUNITY): Payer: Self-pay | Admitting: Family Medicine

## 2019-03-19 DIAGNOSIS — I700 Atherosclerosis of aorta: Secondary | ICD-10-CM | POA: Diagnosis present

## 2019-03-19 DIAGNOSIS — I130 Hypertensive heart and chronic kidney disease with heart failure and stage 1 through stage 4 chronic kidney disease, or unspecified chronic kidney disease: Secondary | ICD-10-CM | POA: Diagnosis present

## 2019-03-19 DIAGNOSIS — R197 Diarrhea, unspecified: Secondary | ICD-10-CM | POA: Diagnosis present

## 2019-03-19 DIAGNOSIS — K56699 Other intestinal obstruction unspecified as to partial versus complete obstruction: Secondary | ICD-10-CM | POA: Diagnosis not present

## 2019-03-19 DIAGNOSIS — E872 Acidosis: Secondary | ICD-10-CM | POA: Diagnosis not present

## 2019-03-19 DIAGNOSIS — I351 Nonrheumatic aortic (valve) insufficiency: Secondary | ICD-10-CM | POA: Diagnosis present

## 2019-03-19 DIAGNOSIS — N183 Chronic kidney disease, stage 3 (moderate): Secondary | ICD-10-CM

## 2019-03-19 DIAGNOSIS — J438 Other emphysema: Secondary | ICD-10-CM | POA: Diagnosis not present

## 2019-03-19 DIAGNOSIS — E860 Dehydration: Secondary | ICD-10-CM | POA: Diagnosis present

## 2019-03-19 DIAGNOSIS — R64 Cachexia: Secondary | ICD-10-CM | POA: Diagnosis present

## 2019-03-19 DIAGNOSIS — J449 Chronic obstructive pulmonary disease, unspecified: Secondary | ICD-10-CM | POA: Diagnosis present

## 2019-03-19 DIAGNOSIS — I480 Paroxysmal atrial fibrillation: Secondary | ICD-10-CM | POA: Diagnosis not present

## 2019-03-19 DIAGNOSIS — K56600 Partial intestinal obstruction, unspecified as to cause: Secondary | ICD-10-CM | POA: Diagnosis not present

## 2019-03-19 DIAGNOSIS — E039 Hypothyroidism, unspecified: Secondary | ICD-10-CM | POA: Diagnosis present

## 2019-03-19 DIAGNOSIS — Z90710 Acquired absence of both cervix and uterus: Secondary | ICD-10-CM | POA: Diagnosis not present

## 2019-03-19 DIAGNOSIS — J9622 Acute and chronic respiratory failure with hypercapnia: Secondary | ICD-10-CM | POA: Diagnosis not present

## 2019-03-19 DIAGNOSIS — I5032 Chronic diastolic (congestive) heart failure: Secondary | ICD-10-CM | POA: Diagnosis present

## 2019-03-19 DIAGNOSIS — I959 Hypotension, unspecified: Secondary | ICD-10-CM | POA: Diagnosis not present

## 2019-03-19 DIAGNOSIS — Z681 Body mass index (BMI) 19 or less, adult: Secondary | ICD-10-CM | POA: Diagnosis not present

## 2019-03-19 DIAGNOSIS — Z431 Encounter for attention to gastrostomy: Secondary | ICD-10-CM | POA: Diagnosis not present

## 2019-03-19 DIAGNOSIS — E785 Hyperlipidemia, unspecified: Secondary | ICD-10-CM | POA: Diagnosis present

## 2019-03-19 DIAGNOSIS — I482 Chronic atrial fibrillation, unspecified: Secondary | ICD-10-CM | POA: Diagnosis present

## 2019-03-19 DIAGNOSIS — J9611 Chronic respiratory failure with hypoxia: Secondary | ICD-10-CM

## 2019-03-19 DIAGNOSIS — J90 Pleural effusion, not elsewhere classified: Secondary | ICD-10-CM | POA: Diagnosis not present

## 2019-03-19 DIAGNOSIS — K56609 Unspecified intestinal obstruction, unspecified as to partial versus complete obstruction: Principal | ICD-10-CM

## 2019-03-19 DIAGNOSIS — Z7901 Long term (current) use of anticoagulants: Secondary | ICD-10-CM | POA: Diagnosis not present

## 2019-03-19 DIAGNOSIS — Z79890 Hormone replacement therapy (postmenopausal): Secondary | ICD-10-CM | POA: Diagnosis not present

## 2019-03-19 DIAGNOSIS — M810 Age-related osteoporosis without current pathological fracture: Secondary | ICD-10-CM | POA: Diagnosis present

## 2019-03-19 DIAGNOSIS — Z66 Do not resuscitate: Secondary | ICD-10-CM | POA: Diagnosis not present

## 2019-03-19 DIAGNOSIS — I2723 Pulmonary hypertension due to lung diseases and hypoxia: Secondary | ICD-10-CM

## 2019-03-19 DIAGNOSIS — Z1159 Encounter for screening for other viral diseases: Secondary | ICD-10-CM | POA: Diagnosis not present

## 2019-03-19 DIAGNOSIS — I272 Pulmonary hypertension, unspecified: Secondary | ICD-10-CM | POA: Diagnosis present

## 2019-03-19 DIAGNOSIS — J9621 Acute and chronic respiratory failure with hypoxia: Secondary | ICD-10-CM | POA: Diagnosis not present

## 2019-03-19 LAB — MAGNESIUM: Magnesium: 2.1 mg/dL (ref 1.7–2.4)

## 2019-03-19 LAB — BASIC METABOLIC PANEL
Anion gap: 9 (ref 5–15)
BUN: 27 mg/dL — ABNORMAL HIGH (ref 8–23)
CO2: 27 mmol/L (ref 22–32)
Calcium: 8.3 mg/dL — ABNORMAL LOW (ref 8.9–10.3)
Chloride: 104 mmol/L (ref 98–111)
Creatinine, Ser: 0.97 mg/dL (ref 0.44–1.00)
GFR calc Af Amer: >60 mL/min (ref >60)
GFR calc non Af Amer: 52 mL/min — ABNORMAL LOW (ref >60)
Glucose, Bld: 145 mg/dL — ABNORMAL HIGH (ref 70–99)
Potassium: 3.9 mmol/L (ref 3.5–5.1)
Sodium: 140 mmol/L (ref 135–145)

## 2019-03-19 LAB — CBC WITH DIFFERENTIAL/PLATELET
Abs Immature Granulocytes: 0.07 10*3/uL (ref 0.00–0.07)
Basophils Absolute: 0 10*3/uL (ref 0.0–0.1)
Basophils Relative: 0 %
Eosinophils Absolute: 0 10*3/uL (ref 0.0–0.5)
Eosinophils Relative: 0 %
HCT: 45.3 % (ref 36.0–46.0)
Hemoglobin: 12.7 g/dL (ref 12.0–15.0)
Immature Granulocytes: 1 %
Lymphocytes Relative: 7 %
Lymphs Abs: 1.1 10*3/uL (ref 0.7–4.0)
MCH: 29.7 pg (ref 26.0–34.0)
MCHC: 28 g/dL — ABNORMAL LOW (ref 30.0–36.0)
MCV: 106.1 fL — ABNORMAL HIGH (ref 80.0–100.0)
Monocytes Absolute: 0.8 10*3/uL (ref 0.1–1.0)
Monocytes Relative: 5 %
Neutro Abs: 13.6 10*3/uL — ABNORMAL HIGH (ref 1.7–7.7)
Neutrophils Relative %: 87 %
Platelets: 189 10*3/uL (ref 150–400)
RBC: 4.27 MIL/uL (ref 3.87–5.11)
RDW: 12.8 % (ref 11.5–15.5)
WBC: 15.6 10*3/uL — ABNORMAL HIGH (ref 4.0–10.5)
nRBC: 0 % (ref 0.0–0.2)

## 2019-03-19 LAB — URINALYSIS, ROUTINE W REFLEX MICROSCOPIC
Bilirubin Urine: NEGATIVE
Glucose, UA: NEGATIVE mg/dL
Hgb urine dipstick: NEGATIVE
Ketones, ur: NEGATIVE mg/dL
Leukocytes,Ua: NEGATIVE
Nitrite: NEGATIVE
Protein, ur: NEGATIVE mg/dL
Specific Gravity, Urine: 1.03 (ref 1.005–1.030)
pH: 6 (ref 5.0–8.0)

## 2019-03-19 LAB — GLUCOSE, CAPILLARY: Glucose-Capillary: 93 mg/dL (ref 70–99)

## 2019-03-19 LAB — C DIFFICILE QUICK SCREEN W PCR REFLEX
C Diff antigen: NEGATIVE
C Diff interpretation: NOT DETECTED
C Diff toxin: NEGATIVE

## 2019-03-19 LAB — SARS CORONAVIRUS 2 BY RT PCR (HOSPITAL ORDER, PERFORMED IN ~~LOC~~ HOSPITAL LAB): SARS Coronavirus 2: NEGATIVE

## 2019-03-19 MED ORDER — IPRATROPIUM-ALBUTEROL 0.5-2.5 (3) MG/3ML IN SOLN
3.0000 mL | Freq: Four times a day (QID) | RESPIRATORY_TRACT | Status: DC
  Filled 2019-03-19: qty 3

## 2019-03-19 MED ORDER — POTASSIUM CHLORIDE IN NACL 20-0.9 MEQ/L-% IV SOLN
INTRAVENOUS | Status: AC
  Administered 2019-03-19 (×2): via INTRAVENOUS
  Filled 2019-03-19 (×2): qty 1000

## 2019-03-19 MED ORDER — DIATRIZOATE MEGLUMINE & SODIUM 66-10 % PO SOLN
90.0000 mL | Freq: Once | ORAL | Status: AC
  Administered 2019-03-19: 90 mL via NASOGASTRIC
  Filled 2019-03-19: qty 90

## 2019-03-19 MED ORDER — SODIUM CHLORIDE 0.9 % IV BOLUS
250.0000 mL | Freq: Once | INTRAVENOUS | Status: AC
  Administered 2019-03-19: 250 mL via INTRAVENOUS

## 2019-03-19 MED ORDER — LIDOCAINE HCL URETHRAL/MUCOSAL 2 % EX GEL
1.0000 "application " | Freq: Once | CUTANEOUS | Status: AC
  Administered 2019-03-19: 1
  Filled 2019-03-19: qty 5

## 2019-03-19 MED ORDER — LEVOTHYROXINE SODIUM 100 MCG/5ML IV SOLN
25.0000 ug | Freq: Every day | INTRAVENOUS | Status: DC
  Administered 2019-03-19 – 2019-03-22 (×4): 25 ug via INTRAVENOUS
  Filled 2019-03-19 (×5): qty 5

## 2019-03-19 MED ORDER — IPRATROPIUM-ALBUTEROL 0.5-2.5 (3) MG/3ML IN SOLN
3.0000 mL | RESPIRATORY_TRACT | Status: DC | PRN
  Administered 2019-03-20 (×3): 3 mL via RESPIRATORY_TRACT
  Filled 2019-03-19 (×3): qty 3

## 2019-03-19 MED ORDER — ONDANSETRON HCL 4 MG PO TABS: 4.0000 mg | ORAL_TABLET | Freq: Four times a day (QID) | ORAL | Status: DC | PRN

## 2019-03-19 MED ORDER — MORPHINE SULFATE (PF) 2 MG/ML IV SOLN
1.0000 mg | INTRAVENOUS | Status: DC | PRN
  Administered 2019-03-21: 1 mg via INTRAVENOUS
  Filled 2019-03-19: qty 1

## 2019-03-19 MED ORDER — ONDANSETRON HCL 4 MG/2ML IJ SOLN: 4.0000 mg | Freq: Four times a day (QID) | INTRAMUSCULAR | Status: DC | PRN

## 2019-03-19 MED ORDER — DEXTROSE IN LACTATED RINGERS 5 % IV SOLN
INTRAVENOUS | Status: DC
  Administered 2019-03-19 (×2): via INTRAVENOUS

## 2019-03-19 MED ORDER — METOPROLOL TARTRATE 5 MG/5ML IV SOLN
5.0000 mg | Freq: Four times a day (QID) | INTRAVENOUS | Status: DC
  Administered 2019-03-19 – 2019-03-22 (×11): 5 mg via INTRAVENOUS
  Filled 2019-03-19 (×11): qty 5

## 2019-03-19 NOTE — Progress Notes (Signed)
PROGRESS NOTE    Andrea [name]  UOH:[id number] DOB: 12-29-1928 DOA: 03/01/2019 PCP: Unk Pinto, MD    Brief Narrative:  85 yo female who presented with nausea, vomiting and diarrhea.  She does have the significant past medical history for paroxysmal atrial fibrillation, COPD with chronic hypoxic respiratory failure, hypertension and hypothyroidism.  Reported about 24 hours of nausea, vomiting and nonbloody diarrhea, persistent, unable to tolerate any p.o. intake, no fevers, no chills.  On her initial physical examination blood pressure 119/77, heart rate 114, respiratory rate 16, temperature 97.9, oxygen saturation 100%, her lungs are clear to auscultation bilaterally, heart S1-S2 present, irregularly irregular, abdomen was distended and tender, without rebound or guarding, no lower extremity edema.  Sodium 140, potassium 3.4, chloride 101, bicarb 29, glucose 134, BUN 29, creatinine 1.98, lipase 27, AST 16, ALT 10, total bilirubin 0.4, white count 16.8, hemoglobin 12.5, hematocrit 43.6, platelets 184.  SARS COVID-19 was negative.  Urine analysis negative for infection.  CT of the abdomen with small bowel obstruction with transition point visualized in the mid to distal ileum in the right hemipelvis.  Patient was admitted to the hospital for working diagnosis of small bowel obstruction.   Assessment & Plan:   Principal Problem:   SBO (small bowel obstruction) (HCC) Active Problems:   COPD with emphysema (HCC)   Hypothyroidism   CKD (chronic kidney disease) stage 3, GFR 30-59 ml/min (HCC)   Pulmonary hypertension due to COPD ([facility])   Chronic respiratory failure with hypoxia (HCC)   AF (paroxysmal atrial fibrillation) (HCC)   Watery diarrhea   1. Small bowel obstruction. Patient has an NG tube in place to low intermittent suction, output not recorded of dark fluid. Her abdomen is soft. Follow abdominal films this am with dilated small bowel. Will continue conservative medical care with IV  fluids, D5 LR at 100 ml per H, add as needed morphine for pain and continue as needed zofran for nausea. Continue NPO and NG tube for decompression.   2. Acute dehydration in the setting of CKD stage 3. Patient clinically very dry, her serum cr is 0,97 but she has a very low body muscular mass, can underestimate renal function. Will continue hydration with balanced electrolyte solutions, will follow on renal panel in am, avoid hypotension or nephrotoxic medications.   3. Chronic atrial fibrillation. Patient remains on atrial fibrillation rhythm, HR from 80 to 100 bpm, at home on diltiazem and metoprolol. Continue to hold on anticoagulation for now, patient may need surgical intervention. No indication for anticoagulation bridging. Will order IV metoprolol q 6 H with holding parameters and will order telemetry monitoring, high risk for RVR.   4. COPD with chronic hypoxic respiratory failure, chronic bilateral pleural effusion. No clinical signs of exacerbation, continue aspiration precautions and oxymetry monitoring.  5. Hypothyroid. Will continue levothyroxine IV.     DVT prophylaxis: enoxaparin   Code Status: dnr  Family Communication: no family at the bedside  Disposition Plan/ discharge barriers: pending clinical improvement, will change to inpatient.   Body mass index is 17.89 kg/m. Malnutrition Type:      Malnutrition Characteristics:      Nutrition Interventions:     RN Pressure Injury Documentation:     Consultants:   Surgery   Procedures:     Antimicrobials:       Subjective: Patient with no flatus or bowel movement, NG tube in place and she has been NPO. No nausea or vomiting, no chest pain or dyspnea. Continue to  be very weak and deconditioned.   Objective: Vitals:   03/19/19 0130 03/19/19 0235 03/19/19 0238 03/19/19 1310  BP: 118/77 122/74  120/69  Pulse: 81 (!) 110  86  Resp: _0 Temp:  97.8 F (36.6 C)  98 F (36.7 C)  TempSrc:  Oral   Oral  SpO2: 91% 91%  94%  Weight:   45.8 kg   Height:   _1  (1.6 m)     Intake/Output Summary (Last 24 hours) at 03/19/2019 1341 Last data filed at 03/19/2019 1310 Gross per 24 hour  Intake 1853.8 ml  Output 350 ml  Net 1503.8 ml   Filed Weights   03/19/19 0238  Weight: 45.8 kg    Examination:   General: deconditioned and ill looking appearing  Neurology: Awake and alert, non focal  E ENT: no pallor, no icterus, oral mucosa very dry.  Cardiovascular: No JVD. S1-S2 present, rhythmic, no gallops, rubs, or murmurs. No lower extremity edema. Pulmonary: positive breath sounds bilaterally, adequate air movement, no wheezing, rhonchi or rales. Gastrointestinal. Abdomen mild distended but soft with no organomegaly, non tender, no rebound or guarding Skin. No rashes Musculoskeletal: no joint deformities     Data Reviewed: I have personally reviewed following labs and imaging studies  CBC: Recent Labs  Lab 03/09/2019 1931 03/19/19 0354  WBC 16.8* 15.6*  NEUTROABS 14.9* 13.6*  HGB 12.5 12.7  HCT 43.6 45.3  MCV 104.1* 106.1*  PLT 184 800   Basic Metabolic Panel: Recent Labs  Lab 03/27/2019 1931 03/19/19 0354  NA 140 140  K 3.4* 3.9  CL 101 104  CO2 29 27  GLUCOSE 134* 145*  BUN 29* 27*  CREATININE 0.98 0.97  CALCIUM 8.7* 8.3*  MG  --  2.1   GFR: Estimated Creatinine Clearance: 29 mL/min (by C-G formula based on SCr of 0.97 mg/dL). Liver Function Tests: Recent Labs  Lab 03/05/2019 1931  AST 16  ALT 10  ALKPHOS 70  BILITOT 0.4  PROT 7.0  ALBUMIN 3.3*   Recent Labs  Lab 03/26/2019 1931  LIPASE 27   No results for input(s): AMMONIA in the last 168 hours. Coagulation Profile: No results for input(s): INR, PROTIME in the last 168 hours. Cardiac Enzymes: No results for input(s): CKTOTAL, CKMB, CKMBINDEX, TROPONINI in the last 168 hours. BNP (last 3 results) No results for input(s): PROBNP in the last 8760 hours. HbA1C: No results for input(s): HGBA1C in the  last 72 hours. CBG: Recent Labs  Lab 03/19/19 1044  GLUCAP 93   Lipid Profile: No results for input(s): CHOL, HDL, LDLCALC, TRIG, CHOLHDL, LDLDIRECT in the last 72 hours. Thyroid Function Tests: No results for input(s): TSH, T4TOTAL, FREET4, T3FREE, THYROIDAB in the last 72 hours. Anemia Panel: No results for input(s): VITAMINB12, FOLATE, FERRITIN, TIBC, IRON, RETICCTPCT in the last 72 hours.    Radiology Studies: I have reviewed all of the imaging during this hospital visit personally     Scheduled Meds: Continuous Infusions:   LOS: 0 days        Mauricio Gerome Apley, MD

## 2019-03-19 NOTE — Progress Notes (Signed)
Patients husband made aware of wife being admitted to hospital per her request.   Norlene Duel RN, BSN

## 2019-03-19 NOTE — Progress Notes (Signed)
Gastrografin administered at [place] [facility] notified.

## 2019-03-19 NOTE — H&P (Signed)
History and Physical    Andrea [name] DGU:[id number] DOB: 17-Feb-1929 DOA: 03/24/2019  PCP: Unk Pinto, MD   Patient coming from: Home   Chief Complaint: Nausea, vomiting, diarrhea   HPI: Andrea [name] is a 85 y.o. female with medical history significant for paroxysmal atrial fibrillation on Eliquis, COPD with chronic hypoxic respiratory failure, hypertension, and hypothyroidism, now presenting to the emergency department for evaluation of nausea, vomiting, and diarrhea.  Patient reports that she developed nausea, vomiting, and nonbloody diarrhea at roughly 5 AM on 02/26/2019, had recurrent episodes throughout the day, has been unable to tolerate any food or drink, reports that the vomiting has eased off, and her last episode of diarrhea was a couple hours prior to arrival.  She denies any fevers or chills, reports some generalized abdominal discomfort, denies any chest pain, cough, or shortness of breath.  She had eaten a lot of food at a graduation party for her grandson the day before, but no one else from the party has had GI illness.  ED Course: Upon arrival to the ED, patient is found to be afebrile, saturating well on room air, slightly tachycardic, and with stable blood pressure.  Chemistry panel is notable for potassium of 3.4 and BUN of 29.  CBC features a macrocytosis without anemia and a leukocytosis to 16,800.  CT of the abdomen and pelvis is concerning for small bowel obstruction with transition point near the distal ileum.  Patient was given IV fluids and C. difficile assay was ordered from the ED.  Hospitalists are asked to admit.  Review of Systems:  All other systems reviewed and apart from HPI, are negative.  Past Medical History:  Diagnosis Date   Aortic regurgitation    Bronchiectasis (HCC)    COPD (chronic obstructive pulmonary disease) (HCC)    DJD (degenerative joint disease)    HLD (hyperlipidemia)    HTN (hypertension)    Hypothyroidism    Palpitations     a. has known history of PAC's and PVC's. b. 09/2015: monitor showed sinus rhythm with occasional PAC's and a brief episode of PAT.   S/P thoracentesis     Past Surgical History:  Procedure Laterality Date   ABDOMINAL HYSTERECTOMY  1993   CATARACT EXTRACTION, BILATERAL     WOUND DEBRIDEMENT Right 06/29/2017   Procedure: RIGHT LOWER  LEG DEBRIDEMENT;  Surgeon: [name], MD;  Location: [facility];  Service: General;  Laterality: Right;     reports that she quit smoking about 44 years ago. Her smoking use included cigarettes. She has a 20.00 pack-year smoking history. She has never used smokeless tobacco. She reports that she does not drink alcohol or use drugs.  Allergies  Allergen Reactions   Vancomycin     Worsening kidney function   Sulfa Antibiotics Palpitations    Unknown    Family History  Problem Relation Age of Onset   Emphysema Mother    Diabetes Mother    Cancer Father    Emphysema Sister    Emphysema Sister    Rheumatic fever Sister    Heart attack Sister    Diabetes Sister    Diabetes Brother    Asthma Brother        as a child     Prior to Admission medications   Medication Sig Start Date End Date Taking? Authorizing Provider  acetaZOLAMIDE (DIAMOX) 250 MG tablet TAKE ONE TABLET (250MG TOTAL) BY MOUTH DAILY 03/08/19   Unk Pinto, MD  ALPRAZolam Duanne Moron) 0.5  MG tablet TAKE ONE-HALF TABLET TO ONE TABLET BY MOUTH TWO TO THREE TIMES DAILY FOR NERVES OR SLEEP. MAY CAUSE DROWSINESS. 02/27/19   Vicie Mutters, PA-C  apixaban (ELIQUIS) 2.5 MG TABS tablet Take 1 tablet (2.5 mg total) by mouth 2 (two) times daily. 11/15/17   Lelon Perla, MD  cefUROXime (CEFTIN) 250 MG tablet Take 1 tablet (250 mg total) by mouth 2 (two) times daily. 02/01/19   Vicie Mutters, PA-C  Cholecalciferol 50 MCG (2000 UT) CAPS Take 1 capsule (2,000 Units total) by mouth daily. 09/15/18   Liane Comber, NP  diltiazem (CARDIZEM CD) 240 MG 24 hr capsule Take 1  capsule (240 mg total) by mouth daily. 08/14/18 02/13/19  Lelon Perla, MD  furosemide (LASIX) 40 MG tablet TAKE ONE TABLET (40MG TOTAL) BY MOUTH TWICE DAILY 12/20/18   Vicie Mutters, PA-C  ipratropium-albuterol (DUONEB) 0.5-2.5 (3) MG/3ML SOLN Take 3 mLs by nebulization QID. 02/15/18   Unk Pinto, MD  levothyroxine (SYNTHROID, LEVOTHROID) 50 MCG tablet TAKE ONE TABLET BY MOUTH ONCE A DAY AS DIRECTED 11/06/18   Unk Pinto, MD  metoprolol tartrate (LOPRESSOR) 50 MG tablet TAKE ONE TABLET (50MG TOTAL) BY MOUTH TWICE DAILY 11/30/18   Unk Pinto, MD  potassium chloride (K-DUR) 10 MEQ tablet TAKE ONE TABLET (10MEQ TOTAL) BY MOUTH DAILY 03/08/19   Almyra Deforest, PA  Probiotic Product (PROBIOTIC-10) CHEW Chew 2 tablets by mouth daily.    [provider]  Respiratory Therapy Supplies (FLUTTER) DEVI Use as directed. 10/19/17   Melvenia Needles, NP    Physical Exam: Vitals:   03/09/2019 1821 03/07/2019 2220 03/21/2019 2226  BP: 119/77 115/64   Pulse: (!) 114  (!) 102  Resp: 16    Temp: 97.9 F (36.6 C)    TempSrc: Oral    SpO2: 100%  96%    Constitutional: NAD, calm  Eyes: PERTLA, lids and conjunctivae normal ENMT: Mucous membranes are moist. Posterior pharynx clear of any exudate or lesions.   Neck: normal, supple, no masses, no thyromegaly Respiratory: no wheezing, no crackles. No accessory muscle use.  Cardiovascular: rate ~100 and irregularly irregular. No extremity edema.  Abdomen: mild distension, soft, mild tenderness in mid-abdomen without rebound pain or guarding. Occasional high-pitched bowel sounds.  Musculoskeletal: no clubbing / cyanosis. No joint deformity upper and lower extremities.    Skin: no significant rashes, lesions, ulcers. Warm, dry, well-perfused. Neurologic: No facial asymmetry. Sensation intact. Strength 5/5 in all 4 limbs.  Psychiatric:  Alert and oriented to person, place, and situation. Very pleasant, cooperative.    Labs on Admission: I have  personally reviewed following labs and imaging studies  CBC: Recent Labs  Lab 03/22/2019 1931  WBC 16.8*  NEUTROABS 14.9*  HGB 12.5  HCT 43.6  MCV 104.1*  PLT 409   Basic Metabolic Panel: Recent Labs  Lab 03/25/2019 1931  NA 140  K 3.4*  CL 101  CO2 29  GLUCOSE 134*  BUN 29*  CREATININE 0.98  CALCIUM 8.7*   GFR: CrCl cannot be calculated (Unknown ideal weight.). Liver Function Tests: Recent Labs  Lab 03/09/2019 1931  AST 16  ALT 10  ALKPHOS 70  BILITOT 0.4  PROT 7.0  ALBUMIN 3.3*   Recent Labs  Lab 03/06/2019 1931  LIPASE 27   No results for input(s): AMMONIA in the last 168 hours. Coagulation Profile: No results for input(s): INR, PROTIME in the last 168 hours. Cardiac Enzymes: No results for input(s): CKTOTAL, CKMB, CKMBINDEX, TROPONINI in the last  168 hours. BNP (last 3 results) No results for input(s): PROBNP in the last 8760 hours. HbA1C: No results for input(s): HGBA1C in the last 72 hours. CBG: No results for input(s): GLUCAP in the last 168 hours. Lipid Profile: No results for input(s): CHOL, HDL, LDLCALC, TRIG, CHOLHDL, LDLDIRECT in the last 72 hours. Thyroid Function Tests: No results for input(s): TSH, T4TOTAL, FREET4, T3FREE, THYROIDAB in the last 72 hours. Anemia Panel: No results for input(s): VITAMINB12, FOLATE, FERRITIN, TIBC, IRON, RETICCTPCT in the last 72 hours. Urine analysis:    Component Value Date/Time   COLORURINE YELLOW 02/28/2019 1558   APPEARANCEUR CLEAR 02/28/2019 1558   LABSPEC 1.015 02/28/2019 1558   PHURINE 8.0 02/28/2019 1558   GLUCOSEU NEGATIVE 02/28/2019 1558   HGBUR NEGATIVE 02/28/2019 1558   BILIRUBINUR NEGATIVE 09/13/2017 1647   KETONESUR NEGATIVE 02/28/2019 1558   PROTEINUR 1+ (A) 02/28/2019 1558   UROBILINOGEN 0.2 08/20/2014 1613   NITRITE NEGATIVE 02/28/2019 1558   LEUKOCYTESUR 1+ (A) 02/28/2019 1558   Sepsis Labs: _0 (procalcitonin:4,lacticidven:4) )No results found for this or any previous visit  (from the past 240 hour(s)).   Radiological Exams on Admission: Dg Shoulder Right  Result Date: 03/21/2019 CLINICAL DATA:  Fall with shoulder pain EXAM: RIGHT SHOULDER - 2+ VIEW COMPARISON:  11/06/2014 FINDINGS: No acute displaced fracture or dislocation. Superior migration of the humeral head with efface subacromial space, consistent with rotator cuff disease. Advanced arthritis of the right shoulder. IMPRESSION: 1. No acute osseous abnormality 2. Advanced arthritis of right shoulder with high-riding humeral head/rotator cuff disease. Electronically Signed   By: Donavan Foil M.D.   On: 03/22/2019 22:49   Ct Abdomen Pelvis W Contrast  Result Date: 03/08/2019 CLINICAL DATA:  Abdominal pain nausea vomiting EXAM: CT ABDOMEN AND PELVIS WITH CONTRAST TECHNIQUE: Multidetector CT imaging of the abdomen and pelvis was performed using the standard protocol following bolus administration of intravenous contrast. CONTRAST:  131m OMNIPAQUE IOHEXOL 300 MG/ML SOLN, 349mOMNIPAQUE IOHEXOL 300 MG/ML SOLN COMPARISON:  Radiograph 01/31/2019, CT 09/13/2017 FINDINGS: Lower chest: Lung bases demonstrate moderate pleural effusions and bilateral lower lobe atelectasis or pneumonia. Mild cardiomegaly with right greater than left atrial enlargement. Hepatobiliary: Perfusion abnormality within the left lobe of the liver as before. No calcified gallstone. No biliary dilatation. Pancreas: Unremarkable. No pancreatic ductal dilatation or surrounding inflammatory changes. Spleen: Normal in size without focal abnormality. Adrenals/Urinary Tract: Adrenal glands are within normal limits. No hydronephrosis. Multiple subcentimeter cortical hypodense renal lesions too small to further characterize. Slightly complicated cyst at the lower poles. The bladder is unremarkable Stomach/Bowel: Stomach is nonenlarged. Multiple dilated fluid-filled loops of small bowel within the lower abdomen and pelvis with dilated bowel measuring up to 4.7 cm.  Transition point in the right pelvis, mid to distal small bowel, series 2, image number 55 with decompressed small bowel distal to this. No intramural air. No bowel wall thickening. Extensive sigmoid colon diverticular disease. The colon is collapsed. Vascular/Lymphatic: Moderate aortic atherosclerosis without aneurysm. No significant adenopathy. Reproductive: Status post hysterectomy. No adnexal masses. Other: Negative for free air. Small amount of ascites in the right lower quadrant near the transition point. Musculoskeletal: Scoliosis and degenerative changes of the spine. No acute osseous abnormality. IMPRESSION: 1. Findings consistent with small bowel obstruction with transition point visualized in the mid to distal ileum in the right hemipelvis. Findings probably due to adhesions. Small amount of ascites within the right lower mesentery near the transition point. Negative for intramural air or free air. 2. Moderate pleural effusions  with basilar atelectasis or pneumonia. Cardiomegaly. 3. Multiple hypodense renal lesions, too small to further characterize, probable small cysts and complicated cysts. 4. Sigmoid colon diverticular disease without acute inflammatory change Electronically Signed   By: Donavan Foil M.D.   On: 03/08/2019 23:35    EKG: Not performed.    Assessment/Plan   1. SBO  - Presents with one day of N/V and numerous episodes of non-bloody diarrhea, is afebrile with leukocytosis to 17k, had been on antibioitics last month and C diff was ordered  - CT abd/pelvis is concerning for SBO with transition in distal ileum  - NGT is being placed in ED, pain is well-controled  - Continue IVF hydration, pain-control, antiemetics, bowel-rest, NGT decompression, serial exams    2. COPD with chronic hypoxic respiratory failure  - No cough, SOB, or wheezing on admission  - Continue supplemental O2 and duonebs    3. Paroxysmal atrial fibrillation  - CHADS-VASc is 26 (age x2, gender, CHF, HTN)    - Eliquis held on admission, resume if SBO resolves with conservative management    4. CKD stage III  - SCr is 0.98 on admission, consistent with her apparent baseline  - Renally-dose medications, avoid dehydration, repeat chem panel in am    5. Chronic diastolic CHF; pulmonary HTN  - Appears hypovolemic on admission in setting of N/V/D and is being hydrated with IVF  - Hold diuretics, continue IVF hydration, follow daily wt and I/O's    6. Hypertension  - BP at goal, use parenteral agents as needed for now     PPE: Mask, face shield, gown, gloves   DVT prophylaxis: SCD's, Eliquis pta  Code Status: DNR, confirmed with patient  Family Communication: Discussed with patient  Consults called: none  Admission status: Observation     Vianne Bulls, MD [facility] Pager [phone]  If 7PM-7AM, please contact night-coverage [email or web address] Password [facility]  03/19/2019, 12:58 AM

## 2019-03-19 NOTE — ED Notes (Signed)
ED TO INPATIENT HANDOFF REPORT  ED Nurse Name and Phone #:  Raquel Sarna, RN  S Name/Age/Gender Andrea [name] 85 y.o. female Room/Bed: WA14/WA14  Code Status   Code Status: Prior  Home/SNF/Other Home Patient oriented to: self, place, time and situation Is this baseline? Yes   Triage Complete: Triage complete  Chief Complaint Nausea Emesis and Diarrhea  Triage Note EMS reports from home, at grandsons graduation [employer] yesterday, states she "ate everything" hot dogs, baked beans, chips, cheesecake, etc, doesn't normally eat like that. N/V/D since 0500 this morning. Hx of CHF. Pt on 2lts O2 24/7  BP 134/71 HR 100 RR 18 Sp02 99 on 2ltrs  18 R forearm   Allergies Allergies  Allergen Reactions  . Vancomycin     Worsening kidney function  . Sulfa Antibiotics Palpitations    Unknown    Level of Care/Admitting Diagnosis ED Disposition    ED Disposition Condition Comment   Admit  Hospital Area: [facility] [[id number]]  Level of Care: Med-Surg [16]  Covid Evaluation: Screening Protocol (No Symptoms)  Diagnosis: SBO (small bowel obstruction) ([place]) [329518]  Admitting Physician: Vianne Bulls [[id number]]  Attending Physician: Vianne Bulls [[id number]]  PT Class (Do Not Modify): Observation [104]  PT Acc Code (Do Not Modify): Observation [10022]       B Medical/Surgery History Past Medical History:  Diagnosis Date  . Aortic regurgitation   . Bronchiectasis ([place])   . COPD (chronic obstructive pulmonary disease) ([place])   . DJD (degenerative joint disease)   . HLD (hyperlipidemia)   . HTN (hypertension)   . Hypothyroidism   . Palpitations    a. has known history of PAC's and PVC's. b. 09/2015: monitor showed sinus rhythm with occasional PAC's and a brief episode of PAT.  . S/P thoracentesis    Past Surgical History:  Procedure Laterality Date  . ABDOMINAL HYSTERECTOMY  1993  . CATARACT EXTRACTION, BILATERAL    . WOUND DEBRIDEMENT Right 06/29/2017    Procedure: RIGHT LOWER  LEG DEBRIDEMENT;  Surgeon: [name], MD;  Location: [facility];  Service: General;  Laterality: Right;     A IV Location/Drains/Wounds Patient Lines/Drains/Airways Status   Active Line/Drains/Airways    Name:   Placement date:   Placement time:   Site:   Days:   Peripheral IV Anterior;Left Forearm   -    -    Forearm      NG/OG Tube Nasogastric 16 Fr. Right nare Aucultation;Xray Measured external length of tube 66 cm   03/19/19    0100    Right nare   less than 1   Incision (Closed) 06/29/17 Leg Right   06/29/17    1130     628   Wound / Incision (Open or Dehisced) 06/28/17 Non-pressure wound;Other (Comment) Leg Right Uneven wound bed, black and red   06/28/17    0035    Leg   629          Intake/Output Last 24 hours  Intake/Output Summary (Last 24 hours) at 03/19/2019 0159 Last data filed at 03/04/2019 2224 Gross per 24 hour  Intake 500 ml  Output -  Net 500 ml    Labs/Imaging Results for orders placed or performed during the hospital encounter of 03/04/2019 (from the past 48 hour(s))  Urinalysis, Routine w reflex microscopic     Status: None   Collection Time: 03/16/2019  7:08 PM  Result Value Ref Range   Color, Urine YELLOW YELLOW  APPearance CLEAR CLEAR   Specific Gravity, Urine 1.030 1.005 - 1.030   pH 6.0 5.0 - 8.0   Glucose, UA NEGATIVE NEGATIVE mg/dL   Hgb urine dipstick NEGATIVE NEGATIVE   Bilirubin Urine NEGATIVE NEGATIVE   Ketones, ur NEGATIVE NEGATIVE mg/dL   Protein, ur NEGATIVE NEGATIVE mg/dL   Nitrite NEGATIVE NEGATIVE   Leukocytes,Ua NEGATIVE NEGATIVE    Comment: Performed at [facility] [address], [place], [place] [address]  Comprehensive metabolic panel     Status: Abnormal   Collection Time: 03/27/2019  7:31 PM  Result Value Ref Range   Sodium 140 135 - 145 mmol/L   Potassium 3.4 (L) 3.5 - 5.1 mmol/L   Chloride 101 98 - 111 mmol/L   CO2 29 22 - 32 mmol/L   Glucose, Bld 134 (H) 70 - 99 mg/dL    BUN 29 (H) 8 - 23 mg/dL   Creatinine, Ser 0.98 0.44 - 1.00 mg/dL   Calcium 8.7 (L) 8.9 - 10.3 mg/dL   Total Protein 7.0 6.5 - 8.1 g/dL   Albumin 3.3 (L) 3.5 - 5.0 g/dL   AST 16 15 - 41 U/L   ALT 10 0 - 44 U/L   Alkaline Phosphatase 70 38 - 126 U/L   Total Bilirubin 0.4 0.3 - 1.2 mg/dL   GFR calc non Af Amer 52 (L) >60 mL/min   GFR calc Af Amer 60 (L) >60 mL/min   Anion gap 10 5 - 15    Comment: Performed at [facility], [place] [address], [place], [place] [address]  Lipase, blood     Status: None   Collection Time: 03/26/2019  7:31 PM  Result Value Ref Range   Lipase 27 11 - 51 U/L    Comment: Performed at [facility], [place] [address], [place], [place] [address]  CBC with Diff     Status: Abnormal   Collection Time: 03/01/2019  7:31 PM  Result Value Ref Range   WBC 16.8 (H) 4.0 - 10.5 K/uL   RBC 4.19 3.87 - 5.11 MIL/uL   Hemoglobin 12.5 12.0 - 15.0 g/dL   HCT 43.6 36.0 - 46.0 %   MCV 104.1 (H) 80.0 - 100.0 fL   MCH 29.8 26.0 - 34.0 pg   MCHC 28.7 (L) 30.0 - 36.0 g/dL   RDW 12.5 11.5 - 15.5 %   Platelets 184 150 - 400 K/uL   nRBC 0.0 0.0 - 0.2 %   Neutrophils Relative % 89 %   Neutro Abs 14.9 (H) 1.7 - 7.7 K/uL   Lymphocytes Relative 5 %   Lymphs Abs 0.8 0.7 - 4.0 K/uL   Monocytes Relative 6 %   Monocytes Absolute 1.1 (H) 0.1 - 1.0 K/uL   Eosinophils Relative 0 %   Eosinophils Absolute 0.0 0.0 - 0.5 K/uL   Basophils Relative 0 %   Basophils Absolute 0.0 0.0 - 0.1 K/uL   Immature Granulocytes 0 %   Abs Immature Granulocytes 0.06 0.00 - 0.07 K/uL    Comment: Performed at [facility], [place] [address] [place], [place] [address]   Dg Shoulder Right  Result Date: 03/10/2019 CLINICAL DATA:  Fall with shoulder pain EXAM: RIGHT SHOULDER - 2+ VIEW COMPARISON:  11/06/2014 FINDINGS: No acute displaced fracture or dislocation. Superior migration of the humeral head with efface subacromial space, consistent with rotator cuff disease.  Advanced arthritis of the right shoulder. IMPRESSION: 1. No acute osseous abnormality 2. Advanced arthritis of  right shoulder with high-riding humeral head/rotator cuff disease. Electronically Signed   By: Donavan Foil M.D.   On: 03/09/2019 22:49   Ct Abdomen Pelvis W Contrast  Result Date: 03/13/2019 CLINICAL DATA:  Abdominal pain nausea vomiting EXAM: CT ABDOMEN AND PELVIS WITH CONTRAST TECHNIQUE: Multidetector CT imaging of the abdomen and pelvis was performed using the standard protocol following bolus administration of intravenous contrast. CONTRAST:  124m OMNIPAQUE IOHEXOL 300 MG/ML SOLN, 363mOMNIPAQUE IOHEXOL 300 MG/ML SOLN COMPARISON:  Radiograph 01/31/2019, CT 09/13/2017 FINDINGS: Lower chest: Lung bases demonstrate moderate pleural effusions and bilateral lower lobe atelectasis or pneumonia. Mild cardiomegaly with right greater than left atrial enlargement. Hepatobiliary: Perfusion abnormality within the left lobe of the liver as before. No calcified gallstone. No biliary dilatation. Pancreas: Unremarkable. No pancreatic ductal dilatation or surrounding inflammatory changes. Spleen: Normal in size without focal abnormality. Adrenals/Urinary Tract: Adrenal glands are within normal limits. No hydronephrosis. Multiple subcentimeter cortical hypodense renal lesions too small to further characterize. Slightly complicated cyst at the lower poles. The bladder is unremarkable Stomach/Bowel: Stomach is nonenlarged. Multiple dilated fluid-filled loops of small bowel within the lower abdomen and pelvis with dilated bowel measuring up to 4.7 cm. Transition point in the right pelvis, mid to distal small bowel, series 2, image number 55 with decompressed small bowel distal to this. No intramural air. No bowel wall thickening. Extensive sigmoid colon diverticular disease. The colon is collapsed. Vascular/Lymphatic: Moderate aortic atherosclerosis without aneurysm. No significant adenopathy. Reproductive: Status  post hysterectomy. No adnexal masses. Other: Negative for free air. Small amount of ascites in the right lower quadrant near the transition point. Musculoskeletal: Scoliosis and degenerative changes of the spine. No acute osseous abnormality. IMPRESSION: 1. Findings consistent with small bowel obstruction with transition point visualized in the mid to distal ileum in the right hemipelvis. Findings probably due to adhesions. Small amount of ascites within the right lower mesentery near the transition point. Negative for intramural air or free air. 2. Moderate pleural effusions with basilar atelectasis or pneumonia. Cardiomegaly. 3. Multiple hypodense renal lesions, too small to further characterize, probable small cysts and complicated cysts. 4. Sigmoid colon diverticular disease without acute inflammatory change Electronically Signed   By: KiDonavan Foil.D.   On: 03/06/2019 23:35    Pending Labs Unresulted Labs (From admission, onward)    Start     Ordered   03/19/19 0003  SARS Coronavirus 2 ([facility]HEID - [facility] Hosp Order  (Asymptomatic Patients Labs)  Once,   STAT    Question:  Rule Out  Answer:  Yes   03/19/19 0003   03/27/2019 1908  C difficile quick scan w PCR reflex  (C Difficile quick screen w PCR reflex panel)  Once, for 24 hours,   STAT     03/20/2019 1907   Signed and Held  Basic metabolic panel  Tomorrow morning,   R     Signed and Held   Signed and Held  Magnesium  Tomorrow morning,   R     Signed and Held   Signed and Held  CBC WITH DIFFERENTIAL  Tomorrow morning,   R     Signed and Held          Vitals/Pain Today's Vitals   02/28/2019 2220 03/11/2019 2226 03/19/19 0103 03/19/19 0130  BP: 115/64  124/78 118/77  Pulse:  (!) 102  81  Resp:    19  Temp:      TempSrc:  SpO2:  96%  91%  PainSc:        Isolation Precautions Enteric precautions (UV disinfection)  Medications Medications  sodium chloride (PF) 0.9 % injection (has no  administration in time range)  0.9 % NaCl with KCl 20 mEq/ L  infusion (has no administration in time range)  sodium chloride 0.9 % bolus 500 mL (0 mLs Intravenous Stopped 03/17/2019 2224)  iohexol (OMNIPAQUE) 300 MG/ML solution 30 mL (30 mLs Oral Contrast Given 03/27/2019 2254)  iohexol (OMNIPAQUE) 300 MG/ML solution 100 mL (100 mLs Intravenous Contrast Given 03/25/2019 2254)  lidocaine (XYLOCAINE) 2 % jelly 1 application (1 application Other Given 03/19/19 0053)  sodium chloride 0.9 % bolus 250 mL (250 mLs Intravenous New Bag/Given 03/19/19 0137)    Mobility non-ambulatory Low fall risk      R Recommendations: See Admitting Provider Note  Report given to: Tillie Fantasia, RN

## 2019-03-19 NOTE — Consult Note (Signed)
Andrea [name] [birth year]  [id number].    Requesting MD: Dr. Riccardo Dubin Arrien Chief Complaint/Reason for Consult: SBO  HPI:  This is an 85 yo white female with a history of COPD, A fib on eliquis, CHF with history of thoracentesis for fluid overload, CKD, and HTN who presents to [facility] yesterday secondary to N/V/D.  She states she has these "episodes" every one in a while for 2-3 years.  She states she was admitted 4 years ago for a "bowel obstruction."  In reviewing the medical chart it appears the patient has been admitted multiple times in the last 4 years to [facility] with bowel obstructions.  She usually resolves on her own with no surgery.  At one time she was also noted to have c diff colitis as well.    She states this episode started last Tuesday or Wednesday.  Details are not incredibly clear.  She has had some diarrhea that she describes more like jelly.  It was maybe improving until Saturday she was at a high school graduation party for her grandson when she states she over indulged.  She then threw up Sunday but has had no further emesis since then.  She was then brought to [facility] where she has a CT scan that reveals a SBO with a transition point.  She has had one episode of diarrhea since arrival, but no further action from below.  Her NGT is in place and essentially only has about 50 cc out.  We have been asked to see her for further recommendations.  ROS: ROS: Please see HPI, otherwise all other systems are currently negative.  Family History  Problem Relation Age of Onset  . Emphysema Mother   . Diabetes Mother   . Cancer Father   . Emphysema Sister   . Emphysema Sister   . Rheumatic fever Sister   . Heart attack Sister   . Diabetes Sister   . Diabetes Brother   . Asthma Brother        as a child    Past Medical History:  Diagnosis Date  . Aortic regurgitation   . Bronchiectasis ([place])   . COPD (chronic obstructive pulmonary disease) ([facility])   . DJD (degenerative joint  disease)   . HLD (hyperlipidemia)   . HTN (hypertension)   . Hypothyroidism   . Palpitations    a. has known history of PAC's and PVC's. b. 09/2015: monitor showed sinus rhythm with occasional PAC's and a brief episode of PAT.  . S/P thoracentesis     Past Surgical History:  Procedure Laterality Date  . ABDOMINAL HYSTERECTOMY  1993  . CATARACT EXTRACTION, BILATERAL    . WOUND DEBRIDEMENT Right 06/29/2017   Procedure: RIGHT LOWER  LEG DEBRIDEMENT;  Surgeon: [name], MD;  Location: [facility];  Service: General;  Laterality: Right;    Social History:  reports that she quit smoking about 44 years ago. Her smoking use included cigarettes. She has a 20.00 pack-year smoking history. She has never used smokeless tobacco. She reports that she does not drink alcohol or use drugs.  Allergies:  Allergies  Allergen Reactions  . Vancomycin     Worsening kidney function  . Sulfa Antibiotics Palpitations    Unknown    Medications Prior to Admission  Medication Sig Dispense Refill  . acetaZOLAMIDE (DIAMOX) 250 MG tablet TAKE ONE TABLET (250MG TOTAL) BY MOUTH DAILY (Patient taking differently: Take 250 mg by mouth daily. TAKE ONE TABLET (250MG TO) 90  tablet 1  . ALPRAZolam (XANAX) 0.5 MG tablet TAKE ONE-HALF TABLET TO ONE TABLET BY MOUTH TWO TO THREE TIMES DAILY FOR NERVES OR SLEEP. MAY CAUSE DROWSINESS. (Patient taking differently: Take 0.25-0.5 mg by mouth 3 (three) times daily as needed for anxiety or sleep. ) 90 tablet 0  . apixaban (ELIQUIS) 2.5 MG TABS tablet Take 1 tablet (2.5 mg total) by mouth 2 (two) times daily. 180 tablet 3  . Cholecalciferol 50 MCG (2000 UT) CAPS Take 1 capsule (2,000 Units total) by mouth daily. 30 each   . diltiazem (CARDIZEM CD) 240 MG 24 hr capsule Take 1 capsule (240 mg total) by mouth daily. 90 capsule 3  . furosemide (LASIX) 40 MG tablet TAKE ONE TABLET (40MG TOTAL) BY MOUTH TWICE DAILY (Patient taking differently: Take 40 mg by mouth 2 (two) times daily  as needed for fluid. ) 180 tablet 1  . ipratropium-albuterol (DUONEB) 0.5-2.5 (3) MG/3ML SOLN Take 3 mLs by nebulization QID. (Patient taking differently: Take 3 mLs by nebulization every 6 (six) hours as needed (SOB, wheezing). ) 360 mL 11  . levothyroxine (SYNTHROID, LEVOTHROID) 50 MCG tablet TAKE ONE TABLET BY MOUTH ONCE A DAY AS DIRECTED (Patient taking differently: Take 50 mcg by mouth daily before breakfast. ) 90 tablet 1  . metoprolol tartrate (LOPRESSOR) 50 MG tablet TAKE ONE TABLET (50MG TOTAL) BY MOUTH TWICE DAILY (Patient taking differently: Take 50 mg by mouth. ) 180 tablet 1  . potassium chloride (K-DUR) 10 MEQ tablet TAKE ONE TABLET (10MEQ TOTAL) BY MOUTH DAILY (Patient taking differently: Take 10 mEq by mouth daily. ) 30 tablet 6  . Probiotic Product (PROBIOTIC-10) CHEW Chew 2 tablets by mouth daily.    [name] Respiratory Therapy Supplies (FLUTTER) DEVI Use as directed. 1 each 0  . cefUROXime (CEFTIN) 250 MG tablet Take 1 tablet (250 mg total) by mouth 2 (two) times daily. (Patient not taking: Reported on 03/19/2019) 20 tablet 0     Physical Exam: Blood pressure 120/69, pulse 86, temperature 98 F (36.7 C), temperature source Oral, resp. rate 16, height _0  (1.6 m), weight 45.8 kg, SpO2 94 %. General: pleasant, frail, elderly white female who is laying in bed in NAD HEENT: head is normocephalic, atraumatic.  Sclera are noninjected.  PERRL.  Ears and nose without any masses or lesions.  Mouth is pink and moist Heart: irregularly irregular.  Normal s1,s2. No obvious murmurs, gallops, or rubs noted.  Palpable radial and pedal pulses bilaterally Lungs: CTAB, no wheezes, rhonchi, or rales noted.  Respiratory effort nonlabored Abd: soft, minimal lower abdominal tenderness to palpation only, mild lower abdominal bloating, +BS, no masses, hernias, or organomegaly.  Very thin abdominal wall, dilated loops are able to be palpated. MS: all 4 extremities are symmetrical with no cyanosis, clubbing,  or edema. Skin: warm and dry with no masses, lesions, or rashes Psych: A&Ox3 with an appropriate affect.   Results for orders placed or performed during the hospital encounter of 03/06/2019 (from the past 48 hour(s))  Urinalysis, Routine w reflex microscopic     Status: None   Collection Time: 03/12/2019  7:08 PM  Result Value Ref Range   Color, Urine YELLOW YELLOW   APPearance CLEAR CLEAR   Specific Gravity, Urine 1.030 1.005 - 1.030   pH 6.0 5.0 - 8.0   Glucose, UA NEGATIVE NEGATIVE mg/dL   Hgb urine dipstick NEGATIVE NEGATIVE   Bilirubin Urine NEGATIVE NEGATIVE   Ketones, ur NEGATIVE NEGATIVE mg/dL   Protein, ur NEGATIVE  NEGATIVE mg/dL   Nitrite NEGATIVE NEGATIVE   Leukocytes,Ua NEGATIVE NEGATIVE    Comment: Performed at [facility] [address] [place], [place] [address]  Comprehensive metabolic panel     Status: Abnormal   Collection Time: 03/17/2019  7:31 PM  Result Value Ref Range   Sodium 140 135 - 145 mmol/L   Potassium 3.4 (L) 3.5 - 5.1 mmol/L   Chloride 101 98 - 111 mmol/L   CO2 29 22 - 32 mmol/L   Glucose, Bld 134 (H) 70 - 99 mg/dL   BUN 29 (H) 8 - 23 mg/dL   Creatinine, Ser 0.98 0.44 - 1.00 mg/dL   Calcium 8.7 (L) 8.9 - 10.3 mg/dL   Total Protein 7.0 6.5 - 8.1 g/dL   Albumin 3.3 (L) 3.5 - 5.0 g/dL   AST 16 15 - 41 U/L   ALT 10 0 - 44 U/L   Alkaline Phosphatase 70 38 - 126 U/L   Total Bilirubin 0.4 0.3 - 1.2 mg/dL   GFR calc non Af Amer 52 (L) >60 mL/min   GFR calc Af Amer 60 (L) >60 mL/min   Anion gap 10 5 - 15    Comment: Performed at [facility], [place] [address] [place], [place] [address]  Lipase, blood     Status: None   Collection Time: 03/09/2019  7:31 PM  Result Value Ref Range   Lipase 27 11 - 51 U/L    Comment: Performed at [facility], [place] [address] [place], [place] [address]  CBC with Diff     Status: Abnormal   Collection Time: 03/12/2019  7:31 PM  Result Value Ref Range   WBC 16.8 (H)  4.0 - 10.5 K/uL   RBC 4.19 3.87 - 5.11 MIL/uL   Hemoglobin 12.5 12.0 - 15.0 g/dL   HCT 43.6 36.0 - 46.0 %   MCV 104.1 (H) 80.0 - 100.0 fL   MCH 29.8 26.0 - 34.0 pg   MCHC 28.7 (L) 30.0 - 36.0 g/dL   RDW 12.5 11.5 - 15.5 %   Platelets 184 150 - 400 K/uL   nRBC 0.0 0.0 - 0.2 %   Neutrophils Relative % 89 %   Neutro Abs 14.9 (H) 1.7 - 7.7 K/uL   Lymphocytes Relative 5 %   Lymphs Abs 0.8 0.7 - 4.0 K/uL   Monocytes Relative 6 %   Monocytes Absolute 1.1 (H) 0.1 - 1.0 K/uL   Eosinophils Relative 0 %   Eosinophils Absolute 0.0 0.0 - 0.5 K/uL   Basophils Relative 0 %   Basophils Absolute 0.0 0.0 - 0.1 K/uL   Immature Granulocytes 0 %   Abs Immature Granulocytes 0.06 0.00 - 0.07 K/uL    Comment: Performed at [facility], [place] [address] [place],  [address]  SARS Coronavirus 2 (CEPHEID - Performed in [place] hospital lab), Hosp Order     Status: None   Collection Time: 03/19/19 12:03 AM   Specimen: Nasopharyngeal Swab  Result Value Ref Range   SARS Coronavirus 2 NEGATIVE NEGATIVE    Comment: (NOTE) If result is NEGATIVE SARS-CoV-2 target nucleic acids are NOT DETECTED. The SARS-CoV-2 RNA is generally detectable in upper and lower  respiratory specimens during the acute phase of infection. The lowest  concentration of SARS-CoV-2 viral copies this assay can detect is 250  copies / mL. A negative result does not preclude SARS-CoV-2 infection  and should not be used as the sole basis  for treatment or other  patient management decisions.  A negative result may occur with  improper specimen collection / handling, submission of specimen other  than nasopharyngeal swab, presence of viral mutation(s) within the  areas targeted by this assay, and inadequate number of viral copies  (<250 copies / mL). A negative result must be combined with clinical  observations, patient history, and epidemiological information. If result is POSITIVE SARS-CoV-2 target nucleic  acids are DETECTED. The SARS-CoV-2 RNA is generally detectable in upper and lower  respiratory specimens dur ing the acute phase of infection.  Positive  results are indicative of active infection with SARS-CoV-2.  Clinical  correlation with patient history and other diagnostic information is  necessary to determine patient infection status.  Positive results do  not rule out bacterial infection or co-infection with other viruses. If result is PRESUMPTIVE POSTIVE SARS-CoV-2 nucleic acids MAY BE PRESENT.   A presumptive positive result was obtained on the submitted specimen  and confirmed on repeat testing.  While 2019 novel coronavirus  (SARS-CoV-2) nucleic acids may be present in the submitted sample  additional confirmatory testing may be necessary for epidemiological  and / or clinical management purposes  to differentiate between  SARS-CoV-2 and other Sarbecovirus currently known to infect humans.  If clinically indicated additional testing with an alternate test  methodology [id number]) is advised. The SARS-CoV-2 RNA is generally  detectable in upper and lower respiratory sp ecimens during the acute  phase of infection. The expected result is Negative. Fact Sheet for Patients:  StrictlyIdeas.no Fact Sheet for Healthcare Providers: BankingDealers.co.za This test is not yet approved or cleared by the [place] FDA and has been authorized for detection and/or diagnosis of SARS-CoV-2 by FDA under an Emergency Use Authorization (EUA).  This EUA will remain in effect (meaning this test can be used) for the duration of the COVID-19 declaration under Section 564(b)(1) of the Act, 21 U.S.C. section 360bbb-3(b)(1), unless the authorization is terminated or revoked sooner. Performed at [facility], [place] [address], [place], [place] [address]   Basic metabolic panel     Status: Abnormal   Collection Time: 03/19/19  3:54  AM  Result Value Ref Range   Sodium 140 135 - 145 mmol/L   Potassium 3.9 3.5 - 5.1 mmol/L   Chloride 104 98 - 111 mmol/L   CO2 27 22 - 32 mmol/L   Glucose, Bld 145 (H) 70 - 99 mg/dL   BUN 27 (H) 8 - 23 mg/dL   Creatinine, Ser 0.97 0.44 - 1.00 mg/dL   Calcium 8.3 (L) 8.9 - 10.3 mg/dL   GFR calc non Af Amer 52 (L) >60 mL/min   GFR calc Af Amer >60 >60 mL/min   Anion gap 9 5 - 15    Comment: Performed at [facility], [place] [address], [place], [place] [address]  Magnesium     Status: None   Collection Time: 03/19/19  3:54 AM  Result Value Ref Range   Magnesium 2.1 1.7 - 2.4 mg/dL    Comment: Performed at [facility], [place] [address], [place], [place] [address]  CBC WITH DIFFERENTIAL     Status: Abnormal   Collection Time: 03/19/19  3:54 AM  Result Value Ref Range   WBC 15.6 (H) 4.0 - 10.5 K/uL   RBC 4.27 3.87 - 5.11 MIL/uL   Hemoglobin 12.7 12.0 - 15.0 g/dL   HCT 45.3 36.0 - 46.0 %   MCV 106.1 (H) 80.0 -  100.0 fL   MCH 29.7 26.0 - 34.0 pg   MCHC 28.0 (L) 30.0 - 36.0 g/dL   RDW 12.8 11.5 - 15.5 %   Platelets 189 150 - 400 K/uL   nRBC 0.0 0.0 - 0.2 %   Neutrophils Relative % 87 %   Neutro Abs 13.6 (H) 1.7 - 7.7 K/uL   Lymphocytes Relative 7 %   Lymphs Abs 1.1 0.7 - 4.0 K/uL   Monocytes Relative 5 %   Monocytes Absolute 0.8 0.1 - 1.0 K/uL   Eosinophils Relative 0 %   Eosinophils Absolute 0.0 0.0 - 0.5 K/uL   Basophils Relative 0 %   Basophils Absolute 0.0 0.0 - 0.1 K/uL   Immature Granulocytes 1 %   Abs Immature Granulocytes 0.07 0.00 - 0.07 K/uL    Comment: Performed at [facility], [place] [address] [place], [place] [address]  C difficile quick scan w PCR reflex     Status: None   Collection Time: 03/19/19  4:03 AM   Specimen: STOOL  Result Value Ref Range   C Diff antigen NEGATIVE NEGATIVE   C Diff toxin NEGATIVE NEGATIVE   C Diff interpretation No C. difficile detected.     Comment: Performed at [facility], [place] [address], [place], [place] [address]  Glucose, capillary     Status: None   Collection Time: 03/19/19 10:44 AM  Result Value Ref Range   Glucose-Capillary 93 70 - 99 mg/dL   Dg Shoulder Right  Result Date: 03/08/2019 CLINICAL DATA:  Fall with shoulder pain EXAM: RIGHT SHOULDER - 2+ VIEW COMPARISON:  11/06/2014 FINDINGS: No acute displaced fracture or dislocation. Superior migration of the humeral head with efface subacromial space, consistent with rotator cuff disease. Advanced arthritis of the right shoulder. IMPRESSION: 1. No acute osseous abnormality 2. Advanced arthritis of right shoulder with high-riding humeral head/rotator cuff disease. Electronically Signed   By: Donavan Foil M.D.   On: 03/03/2019 22:49   Dg Abdomen 1 View  Result Date: 03/19/2019 CLINICAL DATA:  NG tube placement. EXAM: ABDOMEN - 1 VIEW COMPARISON:  CT yesterday. FINDINGS: Tip and side port of the enteric tube below the diaphragm in the stomach. Dilated small bowel again seen. Excreted IV contrast in both renal collecting systems and urinary bladder from prior CT. IMPRESSION: Tip and side port of the enteric tube below the diaphragm in the stomach. Electronically Signed   By: Keith Rake M.D.   On: 03/19/2019 02:35   Ct Abdomen Pelvis W Contrast  Result Date: 03/17/2019 CLINICAL DATA:  Abdominal pain nausea vomiting EXAM: CT ABDOMEN AND PELVIS WITH CONTRAST TECHNIQUE: Multidetector CT imaging of the abdomen and pelvis was performed using the standard protocol following bolus administration of intravenous contrast. CONTRAST:  17m OMNIPAQUE IOHEXOL 300 MG/ML SOLN, 330mOMNIPAQUE IOHEXOL 300 MG/ML SOLN COMPARISON:  Radiograph 01/31/2019, CT 09/13/2017 FINDINGS: Lower chest: Lung bases demonstrate moderate pleural effusions and bilateral lower lobe atelectasis or pneumonia. Mild cardiomegaly with right greater than left atrial enlargement. Hepatobiliary: Perfusion abnormality within the left  lobe of the liver as before. No calcified gallstone. No biliary dilatation. Pancreas: Unremarkable. No pancreatic ductal dilatation or surrounding inflammatory changes. Spleen: Normal in size without focal abnormality. Adrenals/Urinary Tract: Adrenal glands are within normal limits. No hydronephrosis. Multiple subcentimeter cortical hypodense renal lesions too small to further characterize. Slightly complicated cyst at the lower poles. The bladder is unremarkable Stomach/Bowel: Stomach is nonenlarged. Multiple dilated fluid-filled loops of small bowel within the  lower abdomen and pelvis with dilated bowel measuring up to 4.7 cm. Transition point in the right pelvis, mid to distal small bowel, series 2, image number 55 with decompressed small bowel distal to this. No intramural air. No bowel wall thickening. Extensive sigmoid colon diverticular disease. The colon is collapsed. Vascular/Lymphatic: Moderate aortic atherosclerosis without aneurysm. No significant adenopathy. Reproductive: Status post hysterectomy. No adnexal masses. Other: Negative for free air. Small amount of ascites in the right lower quadrant near the transition point. Musculoskeletal: Scoliosis and degenerative changes of the spine. No acute osseous abnormality. IMPRESSION: 1. Findings consistent with small bowel obstruction with transition point visualized in the mid to distal ileum in the right hemipelvis. Findings probably due to adhesions. Small amount of ascites within the right lower mesentery near the transition point. Negative for intramural air or free air. 2. Moderate pleural effusions with basilar atelectasis or pneumonia. Cardiomegaly. 3. Multiple hypodense renal lesions, too small to further characterize, probable small cysts and complicated cysts. 4. Sigmoid colon diverticular disease without acute inflammatory change Electronically Signed   By: Donavan Foil M.D.   On: 02/26/2019 23:35      Assessment/Plan HTN A fib, eliquis  on hold COPD CKD, stage 3 CHF  SBO The patient appears to have a SBO on CT with some dilated loops of small bowel.  She has had a BM since being here, but her films today still show dilated small bowel.  She has an NGT in place and we will leave this in place despite minimal output.  We will start the SBO protocol to see if this helps discern her need for surgical intervention with her SBO.  If her past is any indication, she generally resolves these on her own with just conservative management.  Continue to hold eliquis and we will follow along with you.  FEN - NPO/NGT/IVFs VTE - none currently, ok for chemical prophylaxis from surgical standpoint ID - none  Henreitta Cea, [facility] 03/19/2019, 3:17 PM Pager: [phone]

## 2019-03-20 DIAGNOSIS — J810 Acute pulmonary edema: Secondary | ICD-10-CM

## 2019-03-20 LAB — BASIC METABOLIC PANEL
Anion gap: 11 (ref 5–15)
BUN: 24 mg/dL — ABNORMAL HIGH (ref 8–23)
CO2: 24 mmol/L (ref 22–32)
Calcium: 8.6 mg/dL — ABNORMAL LOW (ref 8.9–10.3)
Chloride: 111 mmol/L (ref 98–111)
Creatinine, Ser: 0.89 mg/dL (ref 0.44–1.00)
GFR calc Af Amer: >60 mL/min (ref >60)
GFR calc non Af Amer: 58 mL/min — ABNORMAL LOW (ref >60)
Glucose, Bld: 187 mg/dL — ABNORMAL HIGH (ref 70–99)
Potassium: 3.5 mmol/L (ref 3.5–5.1)
Sodium: 146 mmol/L — ABNORMAL HIGH (ref 135–145)

## 2019-03-20 LAB — CBC WITH DIFFERENTIAL/PLATELET
Abs Immature Granulocytes: 0.07 10*3/uL (ref 0.00–0.07)
Basophils Absolute: 0 10*3/uL (ref 0.0–0.1)
Basophils Relative: 0 %
Eosinophils Absolute: 0 10*3/uL (ref 0.0–0.5)
Eosinophils Relative: 0 %
HCT: 42.6 % (ref 36.0–46.0)
Hemoglobin: 12.3 g/dL (ref 12.0–15.0)
Immature Granulocytes: 1 %
Lymphocytes Relative: 6 %
Lymphs Abs: 0.8 10*3/uL (ref 0.7–4.0)
MCH: 30.4 pg (ref 26.0–34.0)
MCHC: 28.9 g/dL — ABNORMAL LOW (ref 30.0–36.0)
MCV: 105.4 fL — ABNORMAL HIGH (ref 80.0–100.0)
Monocytes Absolute: 0.6 10*3/uL (ref 0.1–1.0)
Monocytes Relative: 5 %
Neutro Abs: 11.6 10*3/uL — ABNORMAL HIGH (ref 1.7–7.7)
Neutrophils Relative %: 88 %
Platelets: 187 10*3/uL (ref 150–400)
RBC: 4.04 MIL/uL (ref 3.87–5.11)
RDW: 13 % (ref 11.5–15.5)
WBC: 13.1 10*3/uL — ABNORMAL HIGH (ref 4.0–10.5)
nRBC: 0 % (ref 0.0–0.2)

## 2019-03-20 LAB — GLUCOSE, CAPILLARY: Glucose-Capillary: 159 mg/dL — ABNORMAL HIGH (ref 70–99)

## 2019-03-20 MED ORDER — ACETAMINOPHEN 325 MG PO TABS
650.0000 mg | ORAL_TABLET | Freq: Four times a day (QID) | ORAL | Status: DC | PRN
  Administered 2019-03-20: 21:00:00 650 mg via ORAL
  Filled 2019-03-20: qty 2

## 2019-03-20 MED ORDER — OXYCODONE HCL 5 MG PO TABS: 5.0000 mg | ORAL_TABLET | ORAL | Status: DC | PRN

## 2019-03-20 MED ORDER — FUROSEMIDE 10 MG/ML IJ SOLN
40.0000 mg | Freq: Once | INTRAMUSCULAR | Status: AC
  Administered 2019-03-20: 40 mg via INTRAVENOUS
  Filled 2019-03-20: qty 4

## 2019-03-20 MED ORDER — POTASSIUM CHLORIDE CRYS ER 20 MEQ PO TBCR
40.0000 meq | EXTENDED_RELEASE_TABLET | Freq: Once | ORAL | Status: AC
  Administered 2019-03-20: 40 meq via ORAL
  Filled 2019-03-20: qty 2

## 2019-03-20 MED ORDER — APIXABAN 2.5 MG PO TABS
2.5000 mg | ORAL_TABLET | Freq: Two times a day (BID) | ORAL | Status: DC
  Administered 2019-03-20 – 2019-03-22 (×5): 2.5 mg via ORAL
  Filled 2019-03-20 (×6): qty 1

## 2019-03-20 NOTE — Progress Notes (Addendum)
PROGRESS NOTE    Andrea [name]  RXV:[id number] DOB: 10-07-1928 DOA: 03/07/2019 PCP: Unk Pinto, MD    Brief Narrative:  85 yo female who presented with nausea, vomiting and diarrhea.  She does have the significant past medical history for paroxysmal atrial fibrillation, COPD with chronic hypoxic respiratory failure, hypertension and hypothyroidism.  Reported about 24 hours of nausea, vomiting and nonbloody diarrhea, persistent, unable to tolerate any p.o. intake, no fevers, no chills.  On her initial physical examination blood pressure 119/77, heart rate 114, respiratory rate 16, temperature 97.9, oxygen saturation 100%, her lungs are clear to auscultation bilaterally, heart S1-S2 present, irregularly irregular, abdomen was distended and tender, without rebound or guarding, no lower extremity edema.  Sodium 140, potassium 3.4, chloride 101, bicarb 29, glucose 134, BUN 29, creatinine 1.98, lipase 27, AST 16, ALT 10, total bilirubin 0.4, white count 16.8, hemoglobin 12.5, hematocrit 43.6, platelets 184.  SARS COVID-19 was negative.  Urine analysis negative for infection.  CT of the abdomen with small bowel obstruction with transition point visualized in the mid to distal ileum in the right hemipelvis.  Patient was admitted to the hospital for working diagnosis of small bowel obstruction.   06/23. Developed acute hypoxic respiratory failure due to volume overload, required diuresis.    Assessment & Plan:   Principal Problem:   SBO (small bowel obstruction) (HCC) Active Problems:   COPD with emphysema (HCC)   Hypothyroidism   CKD (chronic kidney disease) stage 3, GFR 30-59 ml/min (HCC)   Pulmonary hypertension due to COPD ([place])   Chronic respiratory failure with hypoxia (HCC)   AF (paroxysmal atrial fibrillation) (HCC)   Watery diarrhea   1. New acute hypoxic respiratory failure, due to volume overload. Patient has been on high rate of IV fluids, isotonic saline (100 ml per H), this  am developed acute hypoxic with oxygen saturation down to 80's, on examination positive signs of volume overload. DC IV fluids, will give one dose of IV furosemide and continue oxymetry monitoring, supplemental 02 per [place] to target 02 saturation greater than 92%. If no improvement after diuresis, will perform a chest radiograph.   1. Small bowel obstruction. Patient had positive bowel movement over last 24 H, no nausea or vomiting, ng output 250 ml. Will continue medical management, follow with surgical team recommendations. Will continue as needed analgesics and antiemetics.   2. Acute dehydration in the setting of CKD stage 3. Stable renal function with serum cr at 0,86, Na at 146, K 3,5 and serum bicarbonate at 24. Urine output 1,050 ml, total fluid balance is +2.505 L. One dose of furosemide today for volume overload. Will continue to monitor renal function, electrolytes and urine output. If diet is advanced today will give oral Kcl otherwise will correct with IV Kcl, limit IV fluids due to volume overload.   3. Chronic atrial fibrillation. HR average 80's. Will continue IV metoprolol while patient NPO, continue telemetry monitoring. Continue to hold on anticoagulation for now, in case surgical intervention needed.    4. COPD with chronic hypoxic respiratory failure, chronic bilateral pleural effusion. No exacerbation, continue oxymetry monitoring and supplemental 02 per [place].  5. Hypothyroid. On IV levothyroxine.     DVT prophylaxis: enoxaparin   Code Status: dnr  Family Communication: no family at the bedside.  I spoke with patient's daughter over the phone and addressed all questions.  Disposition Plan/ discharge barriers: pending clinical improvement, will change to inpatient   Body mass index is 18.04 kg/m. Malnutrition  Type:      Malnutrition Characteristics:      Nutrition Interventions:     RN Pressure Injury Documentation:     Consultants:   Surgery    Procedures:     Antimicrobials:       Subjective: Patient this am had acute dyspnea (moderate in intensity, and persistent) associated with hypoxemia, improved with increase 02 flow per [employer]. Had positive bowel movement and her abdominal pain has improved. Continue to have NG in place connected to suction.   Objective: Vitals:   03/20/19 0500 03/20/19 0532 03/20/19 0721 03/20/19 0756  BP:  (!) 146/83 (!) 145/91   Pulse:  86 (!) 42 74  Resp:  18 16   Temp:  97.9 F (36.6 C) 97.8 F (36.6 C)   TempSrc:  Oral Oral   SpO2:  (!) 87% 92% 91%  Weight: 46.2 kg     Height:        Intake/Output Summary (Last 24 hours) at 03/20/2019 0855 Last data filed at 03/20/2019 0600 Gross per 24 hour  Intake 2364.08 ml  Output 1000 ml  Net 1364.08 ml   Filed Weights   03/19/19 0238 03/20/19 0500  Weight: 45.8 kg 46.2 kg    Examination:   General: deconditioned and ill looking appearing  Neurology: Awake and alert, non focal  E ENT: mild pallor, no icterus, NG tube in place.  Cardiovascular: positive  JVD. S1-S2 present, irregularly irregular,with no gallops, rubs, or murmurs. No lower extremity edema. Pulmonary: positive breath sounds bilaterally, decreased air movement, no wheezing or rhonchi, scattered rales. Gastrointestinal. Abdomen soft with no distention, no organomegaly, non tender, no rebound or guarding Skin. No rashes Musculoskeletal: no joint deformities     Data Reviewed: I have personally reviewed following labs and imaging studies  CBC: Recent Labs  Lab 03/11/2019 1931 03/19/19 0354 03/20/19 0434  WBC 16.8* 15.6* 13.1*  NEUTROABS 14.9* 13.6* 11.6*  HGB 12.5 12.7 12.3  HCT 43.6 45.3 42.6  MCV 104.1* 106.1* 105.4*  PLT 184 189 563   Basic Metabolic Panel: Recent Labs  Lab 03/04/2019 1931 03/19/19 0354 03/20/19 0434  NA 140 140 146*  K 3.4* 3.9 3.5  CL 101 104 111  CO2 _0 GLUCOSE 134* 145* 187*  BUN 29* 27* 24*  CREATININE 0.98 0.97 0.89  CALCIUM  8.7* 8.3* 8.6*  MG  --  2.1  --    GFR: Estimated Creatinine Clearance: 31.9 mL/min (by C-G formula based on SCr of 0.89 mg/dL). Liver Function Tests: Recent Labs  Lab 03/08/2019 1931  AST 16  ALT 10  ALKPHOS 70  BILITOT 0.4  PROT 7.0  ALBUMIN 3.3*   Recent Labs  Lab 03/02/2019 1931  LIPASE 27   No results for input(s): AMMONIA in the last 168 hours. Coagulation Profile: No results for input(s): INR, PROTIME in the last 168 hours. Cardiac Enzymes: No results for input(s): CKTOTAL, CKMB, CKMBINDEX, TROPONINI in the last 168 hours. BNP (last 3 results) No results for input(s): PROBNP in the last 8760 hours. HbA1C: No results for input(s): HGBA1C in the last 72 hours. CBG: Recent Labs  Lab 03/19/19 1044 03/20/19 0732  GLUCAP 93 159*   Lipid Profile: No results for input(s): CHOL, HDL, LDLCALC, TRIG, CHOLHDL, LDLDIRECT in the last 72 hours. Thyroid Function Tests: No results for input(s): TSH, T4TOTAL, FREET4, T3FREE, THYROIDAB in the last 72 hours. Anemia Panel: No results for input(s): VITAMINB12, FOLATE, FERRITIN, TIBC, IRON, RETICCTPCT in the last 72 hours.  Radiology Studies: I have reviewed all of the imaging during this hospital visit personally     Scheduled Meds: . furosemide  40 mg Intravenous Once  . levothyroxine  25 mcg Intravenous Daily  . metoprolol tartrate  5 mg Intravenous Q6H   Continuous Infusions:   LOS: 1 day        Mauricio Gerome Apley, MD

## 2019-03-20 NOTE — Progress Notes (Signed)
Late entry. Pt started having trouble breathing around 0715 RN checked O2 sat and it was 70% on 2L. RN increased O2 to 5L and rechecked O2 sat and it was 90%. MD notified and new orders placed. Will continue to monitor.

## 2019-03-20 NOTE — Progress Notes (Signed)
Subjective: CC: SOB Patient complains to me of SOB. She is currently on O2. Per hospitalist note, it appears she developed acute hypoxic respiratory failure and is requiring O2 by [place] and diuresis.   Patient reports abdominal pain, distension and nausea have resolved. Reports she is hungry and wants something to eat. Passing flatus. 7 BM's recoreded on I/O's. Patient is unable to tell me what consistency these were but nurse reports they were very liquidly. 250cc output from NG tube since placement.   Objective: Vital signs in last 24 hours: Temp:  [97.4 F (36.3 C)-98 F (36.7 C)] 97.8 F (36.6 C) (06/23 0721) Pulse Rate:  [42-98] 74 (06/23 0756) Resp:  [16-18] 16 (06/23 0721) BP: (120-146)/(69-91) 145/91 (06/23 0721) SpO2:  [87 %-95 %] 91 % (06/23 0756) Weight:  [46.2 kg] 46.2 kg (06/23 0500) Last BM Date: 03/20/19  Intake/Output from previous day: 06/22 0701 - 06/23 0700 In: 2364.1 [P.O.:300; I.V.:2064.1] Out: 1300 [Urine:1050; Emesis/NG output:250] Intake/Output this shift: Total I/O In: -  Out: 140 [Emesis/NG output:140]  PE: Gen: Awake and alert Heart: Irregular Heart: Tachypnea, on O2. Decreased air movement b/l. No wheezing, rales, or rhonchi.  Abd: Soft, ND, NT, +BS. NG tube in place on LIWS with 250cc output recorded since placement. Msk: DP 2+ b/l   Lab Results:  Recent Labs    03/19/19 0354 03/20/19 0434  WBC 15.6* 13.1*  HGB 12.7 12.3  HCT 45.3 42.6  PLT 189 187   BMET Recent Labs    03/19/19 0354 03/20/19 0434  NA 140 146*  K 3.9 3.5  CL 104 111  CO2 27 24  GLUCOSE 145* 187*  BUN 27* 24*  CREATININE 0.97 0.89  CALCIUM 8.3* 8.6*   PT/INR No results for input(s): LABPROT, INR in the last 72 hours. CMP     Component Value Date/Time   NA 146 (H) 03/20/2019 0434   NA 141 10/26/2017 1226   K 3.5 03/20/2019 0434   CL 111 03/20/2019 0434   CO2 24 03/20/2019 0434   GLUCOSE 187 (H) 03/20/2019 0434   BUN 24 (H) 03/20/2019 0434   BUN  34 (H) 10/26/2017 1226   CREATININE 0.89 03/20/2019 0434   CREATININE 1.18 (H) 02/28/2019 1549   CALCIUM 8.6 (L) 03/20/2019 0434   PROT 7.0 03/21/2019 1931   ALBUMIN 3.3 (L) 03/06/2019 1931   AST 16 03/14/2019 1931   ALT 10 03/08/2019 1931   ALKPHOS 70 02/28/2019 1931   BILITOT 0.4 03/11/2019 1931   GFRNONAA 58 (L) 03/20/2019 0434   GFRNONAA 41 (L) 02/28/2019 1549   GFRAA >60 03/20/2019 0434   GFRAA 48 (L) 02/28/2019 1549   Lipase     Component Value Date/Time   LIPASE 27 03/09/2019 1931       Studies/Results: Dg Shoulder Right  Result Date: 03/20/2019 CLINICAL DATA:  Fall with shoulder pain EXAM: RIGHT SHOULDER - 2+ VIEW COMPARISON:  11/06/2014 FINDINGS: No acute displaced fracture or dislocation. Superior migration of the humeral head with efface subacromial space, consistent with rotator cuff disease. Advanced arthritis of the right shoulder. IMPRESSION: 1. No acute osseous abnormality 2. Advanced arthritis of right shoulder with high-riding humeral head/rotator cuff disease. Electronically Signed   By: Donavan Foil M.D.   On: 03/16/2019 22:49   Dg Abdomen 1 View  Result Date: 03/19/2019 CLINICAL DATA:  NG tube placement. EXAM: ABDOMEN - 1 VIEW COMPARISON:  CT yesterday. FINDINGS: Tip and side port of the enteric tube below  the diaphragm in the stomach. Dilated small bowel again seen. Excreted IV contrast in both renal collecting systems and urinary bladder from prior CT. IMPRESSION: Tip and side port of the enteric tube below the diaphragm in the stomach. Electronically Signed   By: Keith Rake M.D.   On: 03/19/2019 02:35   Ct Abdomen Pelvis W Contrast  Result Date: 03/20/2019 CLINICAL DATA:  Abdominal pain nausea vomiting EXAM: CT ABDOMEN AND PELVIS WITH CONTRAST TECHNIQUE: Multidetector CT imaging of the abdomen and pelvis was performed using the standard protocol following bolus administration of intravenous contrast. CONTRAST:  164m OMNIPAQUE IOHEXOL 300 MG/ML SOLN,  361mOMNIPAQUE IOHEXOL 300 MG/ML SOLN COMPARISON:  Radiograph 01/31/2019, CT 09/13/2017 FINDINGS: Lower chest: Lung bases demonstrate moderate pleural effusions and bilateral lower lobe atelectasis or pneumonia. Mild cardiomegaly with right greater than left atrial enlargement. Hepatobiliary: Perfusion abnormality within the left lobe of the liver as before. No calcified gallstone. No biliary dilatation. Pancreas: Unremarkable. No pancreatic ductal dilatation or surrounding inflammatory changes. Spleen: Normal in size without focal abnormality. Adrenals/Urinary Tract: Adrenal glands are within normal limits. No hydronephrosis. Multiple subcentimeter cortical hypodense renal lesions too small to further characterize. Slightly complicated cyst at the lower poles. The bladder is unremarkable Stomach/Bowel: Stomach is nonenlarged. Multiple dilated fluid-filled loops of small bowel within the lower abdomen and pelvis with dilated bowel measuring up to 4.7 cm. Transition point in the right pelvis, mid to distal small bowel, series 2, image number 55 with decompressed small bowel distal to this. No intramural air. No bowel wall thickening. Extensive sigmoid colon diverticular disease. The colon is collapsed. Vascular/Lymphatic: Moderate aortic atherosclerosis without aneurysm. No significant adenopathy. Reproductive: Status post hysterectomy. No adnexal masses. Other: Negative for free air. Small amount of ascites in the right lower quadrant near the transition point. Musculoskeletal: Scoliosis and degenerative changes of the spine. No acute osseous abnormality. IMPRESSION: 1. Findings consistent with small bowel obstruction with transition point visualized in the mid to distal ileum in the right hemipelvis. Findings probably due to adhesions. Small amount of ascites within the right lower mesentery near the transition point. Negative for intramural air or free air. 2. Moderate pleural effusions with basilar atelectasis or  pneumonia. Cardiomegaly. 3. Multiple hypodense renal lesions, too small to further characterize, probable small cysts and complicated cysts. 4. Sigmoid colon diverticular disease without acute inflammatory change Electronically Signed   By: KiDonavan Foil.D.   On: 03/17/2019 23:35   Dg Abd Portable 1v-small Bowel Obstruction Protocol-initial, 8 Hr Delay  Result Date: 03/20/2019 CLINICAL DATA:  Small bowel obstruction, 8 hour delayed film. EXAM: PORTABLE ABDOMEN - 1 VIEW COMPARISON:  Radiograph earlier this day. FINDINGS: There is administered enteric contrast in the ascending, transverse, and rectosigmoid colon. Improved small bowel dilatation from prior exam. Enteric tube remains in place. Minimal residual contrast in the bladder from prior CT. IMPRESSION: Administered enteric contrast throughout the entire colon. Improved small bowel dilatation from prior. Electronically Signed   By: MeKeith Rake.D.   On: 03/20/2019 02:16    Anti-infectives: Anti-infectives (From admission, onward)   None     Assessment/Plan HTN A fib, eliquis on hold COPD CKD, stage 3 CHF Acute respiratory failure   - Per [facility]   SBO - Contrast throughout colon on Xray this am. Having bowel function. Pull NGT and start on CLD - Mobilize as tolerated for bowel function - We will continue to follow   FEN - CLD. Fluids per [facility] VTE - SCDs, ok  for chemical prophylaxis from surgical standpoint ID - none POC - [name] [phone]   LOS: 1 day    Jillyn Ledger , [facility] 03/20/2019, 9:45 AM Pager: [phone]

## 2019-03-20 NOTE — Progress Notes (Signed)
Ok to resume apixaban 2.30m PO BID for afib per [facility] and Dr. ACathlean Sauer Age>80, wt<60kg, scr<1.5  MOnnie Boer PharmD, [facility] AAHIVP, CPP Infectious Disease Pharmacist 03/20/2019 1:38 PM

## 2019-03-21 ENCOUNTER — Ambulatory Visit: Payer: Medicare Other | Admitting: Cardiology

## 2019-03-21 ENCOUNTER — Inpatient Hospital Stay (HOSPITAL_COMMUNITY): Payer: Medicare Other

## 2019-03-21 LAB — CBC WITH DIFFERENTIAL/PLATELET
Abs Immature Granulocytes: 0.08 10*3/uL — ABNORMAL HIGH (ref 0.00–0.07)
Basophils Absolute: 0 10*3/uL (ref 0.0–0.1)
Basophils Relative: 0 %
Eosinophils Absolute: 0 10*3/uL (ref 0.0–0.5)
Eosinophils Relative: 0 %
HCT: 44.3 % (ref 36.0–46.0)
Hemoglobin: 12.9 g/dL (ref 12.0–15.0)
Immature Granulocytes: 1 %
Lymphocytes Relative: 5 %
Lymphs Abs: 0.7 10*3/uL (ref 0.7–4.0)
MCH: 30.8 pg (ref 26.0–34.0)
MCHC: 29.1 g/dL — ABNORMAL LOW (ref 30.0–36.0)
MCV: 105.7 fL — ABNORMAL HIGH (ref 80.0–100.0)
Monocytes Absolute: 0.7 10*3/uL (ref 0.1–1.0)
Monocytes Relative: 5 %
Neutro Abs: 13.2 10*3/uL — ABNORMAL HIGH (ref 1.7–7.7)
Neutrophils Relative %: 89 %
Platelets: 198 10*3/uL (ref 150–400)
RBC: 4.19 MIL/uL (ref 3.87–5.11)
RDW: 13.1 % (ref 11.5–15.5)
WBC: 14.8 10*3/uL — ABNORMAL HIGH (ref 4.0–10.5)
nRBC: 0 % (ref 0.0–0.2)

## 2019-03-21 LAB — BLOOD GAS, ARTERIAL
Acid-base deficit: 0.1 mmol/L (ref 0.0–2.0)
Bicarbonate: 26.1 mmol/L (ref 20.0–28.0)
Drawn by: 295031
O2 Content: 6 L/min
O2 Saturation: 83.6 %
Patient temperature: 98.6
pCO2 arterial: 50.8 mmHg — ABNORMAL HIGH (ref 32.0–48.0)
pH, Arterial: 7.331 — ABNORMAL LOW (ref 7.350–7.450)
pO2, Arterial: 54.9 mmHg — ABNORMAL LOW (ref 83.0–108.0)

## 2019-03-21 LAB — BASIC METABOLIC PANEL
Anion gap: 9 (ref 5–15)
BUN: 31 mg/dL — ABNORMAL HIGH (ref 8–23)
CO2: 26 mmol/L (ref 22–32)
Calcium: 9 mg/dL (ref 8.9–10.3)
Chloride: 110 mmol/L (ref 98–111)
Creatinine, Ser: 1.16 mg/dL — ABNORMAL HIGH (ref 0.44–1.00)
GFR calc Af Amer: 49 mL/min — ABNORMAL LOW (ref >60)
GFR calc non Af Amer: 42 mL/min — ABNORMAL LOW (ref >60)
Glucose, Bld: 160 mg/dL — ABNORMAL HIGH (ref 70–99)
Potassium: 3.6 mmol/L (ref 3.5–5.1)
Sodium: 145 mmol/L (ref 135–145)

## 2019-03-21 LAB — GLUCOSE, CAPILLARY: Glucose-Capillary: 131 mg/dL — ABNORMAL HIGH (ref 70–99)

## 2019-03-21 MED ORDER — DILTIAZEM HCL 25 MG/5ML IV SOLN
10.0000 mg | Freq: Once | INTRAVENOUS | Status: AC
  Administered 2019-03-21: 10 mg via INTRAVENOUS
  Filled 2019-03-21: qty 5

## 2019-03-21 MED ORDER — DILTIAZEM HCL 25 MG/5ML IV SOLN: 10.0000 mg | Freq: Once | INTRAVENOUS | Status: DC

## 2019-03-21 MED ORDER — DILTIAZEM LOAD VIA INFUSION
10.0000 mg | Freq: Once | INTRAVENOUS | Status: AC
  Administered 2019-03-21: 10 mg via INTRAVENOUS
  Filled 2019-03-21: qty 10

## 2019-03-21 MED ORDER — MORPHINE SULFATE (PF) 2 MG/ML IV SOLN
1.0000 mg | Freq: Once | INTRAVENOUS | Status: AC
  Administered 2019-03-21: 1 mg via INTRAVENOUS

## 2019-03-21 MED ORDER — DILTIAZEM HCL-DEXTROSE 100-5 MG/100ML-% IV SOLN (PREMIX)
10.0000 mg/h | INTRAVENOUS | Status: DC
  Filled 2019-03-21: qty 100

## 2019-03-21 MED ORDER — MORPHINE SULFATE (PF) 2 MG/ML IV SOLN
INTRAVENOUS | Status: AC
  Filled 2019-03-21: qty 1

## 2019-03-21 MED ORDER — DILTIAZEM HCL 100 MG IV SOLR
5.0000 mg/h | INTRAVENOUS | Status: DC
  Administered 2019-03-21: 15 mg/h via INTRAVENOUS
  Administered 2019-03-21: 5 mg/h via INTRAVENOUS
  Administered 2019-03-22 – 2019-03-23 (×4): 15 mg/h via INTRAVENOUS
  Filled 2019-03-21 (×7): qty 100

## 2019-03-21 MED ORDER — METOPROLOL TARTRATE 5 MG/5ML IV SOLN
INTRAVENOUS | Status: AC
  Administered 2019-03-21: 2.5 mg
  Filled 2019-03-21: qty 5

## 2019-03-21 MED ORDER — METOPROLOL TARTRATE 5 MG/5ML IV SOLN
2.5000 mg | Freq: Once | INTRAVENOUS | Status: AC
  Administered 2019-03-21: 2.5 mg via INTRAVENOUS

## 2019-03-21 MED ORDER — PIPERACILLIN-TAZOBACTAM 3.375 G IVPB
3.3750 g | Freq: Three times a day (TID) | INTRAVENOUS | Status: DC
  Administered 2019-03-21 – 2019-03-23 (×5): 3.375 g via INTRAVENOUS
  Filled 2019-03-21 (×5): qty 50

## 2019-03-21 MED ORDER — FUROSEMIDE 10 MG/ML IJ SOLN
40.0000 mg | Freq: Once | INTRAMUSCULAR | Status: AC
  Administered 2019-03-21: 40 mg via INTRAVENOUS
  Filled 2019-03-21: qty 4

## 2019-03-21 NOTE — Significant Event (Signed)
Noted to be short of breath, she was placed on a nonrebreather with improvement. She was been weaned off the nonrebreather to 5 L initially but I placed back on nonrebreather as she is mouth breathing and no facemasks are available.  Zosyn was added for possible underlying pneumonia versus atelectasis.  She was also in atrial fibrillation with rapid ventricular response.  Diltiazem drip was started earlier which is improved her heart rate control.   I called patient's daughter to notify her of her mother's condition, she is aware and again confirmed that patient is DNR and ask [place] to do the best we can but is aware that her "mother is very old and has very little reserve" in her words,,"we will pray for her"

## 2019-03-21 NOTE — Progress Notes (Addendum)
Subjective: CC: SOB Patient complains to me of SOB. She is currently on 5L o2. Appears she has duonebs x 3 yesterday along with lasix 60m IV x 1. She is requested Xanax. Reports she is not sleeping well. She reports no abdominal pain, N/V. Passing flatus. Several watery/loose BM's yesterday. C. Diff negative. Passing flatus. Not mobilizing or using IS.   Objective: Vital signs in last 24 hours: Temp:  [97.5 F (36.4 C)-98.2 F (36.8 C)] 98.2 F (36.8 C) (06/24 0535) Pulse Rate:  [41-147] 76 (06/24 0535) Resp:  [17-18] 18 (06/24 0535) BP: (134-156)/(80-86) 156/80 (06/24 0535) SpO2:  [89 %-100 %] 100 % (06/24 0535) Weight:  [46.9 kg] 46.9 kg (06/24 0500) Last BM Date: 03/20/19  Intake/Output from previous day: 06/23 0701 - 06/24 0700 In: 1015 [P.O.:590; I.V.:425] Out: 790 [Urine:650; Emesis/NG output:140] Intake/Output this shift: No intake/output data recorded.  PE: Gen: Awake and alert Heart: Irregular Heart: Tachypnea, on 5L O2. Decreased air movement b/l. No wheezing, rales, or rhonchi.  Abd: Soft, ND, NT, +BS.  Msk: DP 2+ b/l. SCDs in place b/l.   Lab Results:  Recent Labs    03/20/19 0434 03/21/19 0324  WBC 13.1* 14.8*  HGB 12.3 12.9  HCT 42.6 44.3  PLT 187 198   BMET Recent Labs    03/20/19 0434 03/21/19 0324  NA 146* 145  K 3.5 3.6  CL 111 110  CO2 24 26  GLUCOSE 187* 160*  BUN 24* 31*  CREATININE 0.89 1.16*  CALCIUM 8.6* 9.0   PT/INR No results for input(s): LABPROT, INR in the last 72 hours. CMP     Component Value Date/Time   NA 145 03/21/2019 0324   NA 141 10/26/2017 1226   K 3.6 03/21/2019 0324   CL 110 03/21/2019 0324   CO2 26 03/21/2019 0324   GLUCOSE 160 (H) 03/21/2019 0324   BUN 31 (H) 03/21/2019 0324   BUN 34 (H) 10/26/2017 1226   CREATININE 1.16 (H) 03/21/2019 0324   CREATININE 1.18 (H) 02/28/2019 1549   CALCIUM 9.0 03/21/2019 0324   PROT 7.0 03/05/2019 1931   ALBUMIN 3.3 (L) 03/26/2019 1931   AST 16 03/21/2019 1931    ALT 10 03/20/2019 1931   ALKPHOS 70 03/03/2019 1931   BILITOT 0.4 03/17/2019 1931   GFRNONAA 42 (L) 03/21/2019 0324   GFRNONAA 41 (L) 02/28/2019 1549   GFRAA 49 (L) 03/21/2019 0324   GFRAA 48 (L) 02/28/2019 1549   Lipase     Component Value Date/Time   LIPASE 27 03/08/2019 1931       Studies/Results: Dg Abd Portable 1v-small Bowel Obstruction Protocol-initial, 8 Hr Delay  Result Date: 03/20/2019 CLINICAL DATA:  Small bowel obstruction, 8 hour delayed film. EXAM: PORTABLE ABDOMEN - 1 VIEW COMPARISON:  Radiograph earlier this day. FINDINGS: There is administered enteric contrast in the ascending, transverse, and rectosigmoid colon. Improved small bowel dilatation from prior exam. Enteric tube remains in place. Minimal residual contrast in the bladder from prior CT. IMPRESSION: Administered enteric contrast throughout the entire colon. Improved small bowel dilatation from prior. Electronically Signed   By: MKeith RakeM.D.   On: 03/20/2019 02:16    Anti-infectives: Anti-infectives (From admission, onward)   None       Assessment/Plan HTN A fib, eliquis restarted COPD CKD, stage 3 CHF Acute respiratory failure requiring O2  - Per [facility]   [facility]  SBO - Contrast  throughout colon on Xray. Having bowel function. Tolerating CLD - Start FLD, advance to soft as tolearted - Mobilize as tolerated for bowel function - We will continue to follow   FEN -FLD, advance as tolerated to soft. IVF per [facility]  VTE -SCDs, Eliquis  ID -none POC - [name] [phone]    LOS: 2 days    Jillyn Ledger , [facility] 03/21/2019, 8:38 AM Pager: [phone]

## 2019-03-21 NOTE — Progress Notes (Signed)
Pt heart rate jumping from 20's to 90's. Pt O2 sats dropped to low 80's on 2L Pt O2 bumped up to 5L. MD notified new orders placed.

## 2019-03-21 NOTE — Progress Notes (Signed)
Patient noted to be visibly short of breath, respirations in the 30s, lips a bluish hue, rapid response called, MD paged.  MD gave orders.  See new orders.  Will continue to monitor.  [place]ia [place], RN

## 2019-03-21 NOTE — Progress Notes (Signed)
PROGRESS NOTE    Andrea [name]  DIY:[id number] DOB: [birth year] DOA: 03/02/2019 PCP: Unk Pinto, MD   Brief Narrative:  85 yo female who presented with nausea, vomiting and diarrhea.She does have the significant past medical history for paroxysmal atrial fibrillation, COPD with chronic hypoxic respiratory failure, hypertension and hypothyroidism. Reported about 24 hours of nausea, vomiting and nonbloody diarrhea, persistent, unable to tolerate any p.o. intake, no fevers, no chills. On her initial physical examination blood pressure 119/77, heart rate 114, respiratory rate 16, temperature 97.9, oxygen saturation 100%, her lungs are clear to auscultation bilaterally, heart S1-S2 present, irregularly irregular, abdomen was distended and tender, without rebound or guarding, no lower extremity edema. Sodium 140, potassium 3.4, chloride 101, bicarb 29, glucose 134, BUN 29, creatinine 1.98, lipase 27, AST 16, ALT 10, total bilirubin 0.4, white count 16.8, hemoglobin 12.5, hematocrit 43.6, platelets 184.SARS COVID-19 was negative.Urine analysis negative for infection.CT of the abdomen with small bowel obstruction with transition point visualized in the mid to distal ileum in the right hemipelvis.  Patient was admitted to the hospital for working diagnosis of small bowel obstruction.  06/23. Developed acute hypoxic respiratory failure due to volume overload, required diuresis.    Assessment & Plan:   Principal Problem:   SBO (small bowel obstruction) (HCC) Active Problems:   COPD with emphysema (HCC)   Hypothyroidism   CKD (chronic kidney disease) stage 3, GFR 30-59 ml/min (HCC)   Pulmonary hypertension due to COPD ([place])   Chronic respiratory failure with hypoxia (HCC)   AF (paroxysmal atrial fibrillation) (HCC)   Watery diarrhea   1. New acute hypoxic respiratory failure, due to volume overload. Patient has been on high rate of IV fluids, isotonic saline (100 ml per H), and  developed acute hypoxic with oxygen saturation down to 80's 6/23, on examination positive signs of volume overload. DC IV fluids, she received one dose of IV furosemide, patient still hypoxic this morning repeat a chest x-ray, will give 1 dose of Lasix 40 mg IV, continue O2 sats monitoring and O2 at 5 L for now anticipate is improving with diuresis  1. Small bowel obstruction.   As noted, patient had positive bowel movement over last 24 H, no nausea or vomiting, ng output 250 ml. Will continue medical management, follow with surgical team recommendations. Will continue as needed analgesics and antiemetics.   2. Acute dehydration in the setting of CKD stage 3. Stable renal function with serum cr at 0,86, Na at 146, K 3,6 and serum bicarbonate at 26.  Patient was noted to be over 2-1/2 L positive yesterday, diuresis as above  3. atrial fibrillation RVR.  Patient's beta-blocker resumed, loaded diltiazem x1 if patient's heart rate persists we will add diltiazem drip and transfer, monitor for response to diltiazem IV 10 mg x 1,    4. COPD with chronic hypoxic respiratory failure, chronic bilateral pleural effusion. No exacerbation, continue oxymetry monitoring and supplemental 02 per [place].  Patient is on 2 L at baseline currently 5 L, anticipate this will improve with diuresis as above  5. Hypothyroid. On IV levothyroxine.  DVT prophylaxis: Lovenox SQ  Code Status: DNR    Code Status Orders  (From admission, onward)         Start     Ordered   03/19/19 0237  Do not attempt resuscitation (DNR)  Continuous    Question Answer Comment  In the event of cardiac or respiratory ARREST Do not call a code blue   In  the event of cardiac or respiratory ARREST Do not perform Intubation, CPR, defibrillation or ACLS   In the event of cardiac or respiratory ARREST Use medication by any route, position, wound care, and other measures to relive pain and suffering. May use oxygen, suction and manual  treatment of airway obstruction as needed for comfort.      03/19/19 0236        Code Status History    Date Active Date Inactive Code Status Order ID Comments User Context   09/14/2017 0029 09/21/2017 2012 Full Code [id number]  Oswald Hillock, MD Inpatient   08/26/2017 1651 09/01/2017 2017 Full Code [id number]  Eber Jones, MD Inpatient   06/28/2017 0111 06/30/2017 1910 Full Code [id number]  Reubin Milan, MD Inpatient   03/31/2017 0045 04/12/2017 1906 Full Code [id number]  Oswald Hillock, MD Inpatient   04/19/2016 2324 04/25/2016 1719 Full Code [id number]  Jani Gravel, MD Inpatient   10/01/2013 1536 10/05/2013 2020 Full Code [id number]  Velvet Bathe, MD Inpatient   Advance Care Planning Activity     Family Communication: [phone], spoke with daughter Disposition Plan:   Patient will remain inpatient for treatment of complex medical problems to include resolving small bowel obstruction, atrial fibrillation with RVR requiring IV medications as well as subspecialty care with general surgery.  Without these treatments patient be at risk of life-threatening clinical deterioration. Consults called: gen surgery Admission status: Inpatient   Consultants:   GS  Procedures:  Dg Shoulder Right  Result Date: 03/15/2019 CLINICAL DATA:  Fall with shoulder pain EXAM: RIGHT SHOULDER - 2+ VIEW COMPARISON:  11/06/2014 FINDINGS: No acute displaced fracture or dislocation. Superior migration of the humeral head with efface subacromial space, consistent with rotator cuff disease. Advanced arthritis of the right shoulder. IMPRESSION: 1. No acute osseous abnormality 2. Advanced arthritis of right shoulder with high-riding humeral head/rotator cuff disease. Electronically Signed   By: Donavan Foil M.D.   On: 03/03/2019 22:49   Dg Abdomen 1 View  Result Date: 03/19/2019 CLINICAL DATA:  NG tube placement. EXAM: ABDOMEN - 1 VIEW COMPARISON:  CT yesterday. FINDINGS: Tip and side port of the enteric tube  below the diaphragm in the stomach. Dilated small bowel again seen. Excreted IV contrast in both renal collecting systems and urinary bladder from prior CT. IMPRESSION: Tip and side port of the enteric tube below the diaphragm in the stomach. Electronically Signed   By: Keith Rake M.D.   On: 03/19/2019 02:35   Ct Abdomen Pelvis W Contrast  Result Date: 03/17/2019 CLINICAL DATA:  Abdominal pain nausea vomiting EXAM: CT ABDOMEN AND PELVIS WITH CONTRAST TECHNIQUE: Multidetector CT imaging of the abdomen and pelvis was performed using the standard protocol following bolus administration of intravenous contrast. CONTRAST:  162m OMNIPAQUE IOHEXOL 300 MG/ML SOLN, 350mOMNIPAQUE IOHEXOL 300 MG/ML SOLN COMPARISON:  Radiograph 01/31/2019, CT 09/13/2017 FINDINGS: Lower chest: Lung bases demonstrate moderate pleural effusions and bilateral lower lobe atelectasis or pneumonia. Mild cardiomegaly with right greater than left atrial enlargement. Hepatobiliary: Perfusion abnormality within the left lobe of the liver as before. No calcified gallstone. No biliary dilatation. Pancreas: Unremarkable. No pancreatic ductal dilatation or surrounding inflammatory changes. Spleen: Normal in size without focal abnormality. Adrenals/Urinary Tract: Adrenal glands are within normal limits. No hydronephrosis. Multiple subcentimeter cortical hypodense renal lesions too small to further characterize. Slightly complicated cyst at the lower poles. The bladder is unremarkable Stomach/Bowel: Stomach is nonenlarged. Multiple dilated fluid-filled loops of small bowel within the lower  abdomen and pelvis with dilated bowel measuring up to 4.7 cm. Transition point in the right pelvis, mid to distal small bowel, series 2, image number 55 with decompressed small bowel distal to this. No intramural air. No bowel wall thickening. Extensive sigmoid colon diverticular disease. The colon is collapsed. Vascular/Lymphatic: Moderate aortic atherosclerosis  without aneurysm. No significant adenopathy. Reproductive: Status post hysterectomy. No adnexal masses. Other: Negative for free air. Small amount of ascites in the right lower quadrant near the transition point. Musculoskeletal: Scoliosis and degenerative changes of the spine. No acute osseous abnormality. IMPRESSION: 1. Findings consistent with small bowel obstruction with transition point visualized in the mid to distal ileum in the right hemipelvis. Findings probably due to adhesions. Small amount of ascites within the right lower mesentery near the transition point. Negative for intramural air or free air. 2. Moderate pleural effusions with basilar atelectasis or pneumonia. Cardiomegaly. 3. Multiple hypodense renal lesions, too small to further characterize, probable small cysts and complicated cysts. 4. Sigmoid colon diverticular disease without acute inflammatory change Electronically Signed   By: Donavan Foil M.D.   On: 03/17/2019 23:35   Dg Abd Portable 1v-small Bowel Obstruction Protocol-initial, 8 Hr Delay  Result Date: 03/20/2019 CLINICAL DATA:  Small bowel obstruction, 8 hour delayed film. EXAM: PORTABLE ABDOMEN - 1 VIEW COMPARISON:  Radiograph earlier this day. FINDINGS: There is administered enteric contrast in the ascending, transverse, and rectosigmoid colon. Improved small bowel dilatation from prior exam. Enteric tube remains in place. Minimal residual contrast in the bladder from prior CT. IMPRESSION: Administered enteric contrast throughout the entire colon. Improved small bowel dilatation from prior. Electronically Signed   By: Keith Rake M.D.   On: 03/20/2019 02:16     Antimicrobials:   NONE    Subjective: Patient reports feeling mildly short of breath this morning she is noted to be on 5 L oxygen with mildly labored breathing.  Objective: Vitals:   03/20/19 2043 03/21/19 0500 03/21/19 0535 03/21/19 1131  BP: (!) 142/82  (!) 156/80   Pulse: (!) 54  76   Resp: 18  18    Temp: 98.2 F (36.8 C)  98.2 F (36.8 C)   TempSrc: Oral  Oral   SpO2: 90%  100% 94%  Weight:  46.9 kg    Height:        Intake/Output Summary (Last 24 hours) at 03/21/2019 1245 Last data filed at 03/21/2019 0941 Gross per 24 hour  Intake 650 ml  Output 375 ml  Net 275 ml   Filed Weights   03/19/19 0238 03/20/19 0500 03/21/19 0500  Weight: 45.8 kg 46.2 kg 46.9 kg    Examination:  General exam: Appears calm although mildly short of breath Respiratory system: Mild rhonchi, mild rales bilaterally, mildly labored breathing Cardiovascular system: S1 & S2 heard, RRR. No JVD, murmurs, rubs, gallops or clicks. No pedal edema. Gastrointestinal system: Abdomen is soft, mildly tender to palpation diffusely no focal findings, not rigid no guarding, distant bowel sounds Central nervous system: Alert and oriented. No focal neurological deficits. Extremities: Warm well perfused, Skin: No rashes, lesions or ulcers Psychiatry: Judgement and insight appear normal. Mood & affect appropriate.     Data Reviewed: I have personally reviewed following labs and imaging studies  CBC: Recent Labs  Lab 03/26/2019 1931 03/19/19 0354 03/20/19 0434 03/21/19 0324  WBC 16.8* 15.6* 13.1* 14.8*  NEUTROABS 14.9* 13.6* 11.6* 13.2*  HGB 12.5 12.7 12.3 12.9  HCT 43.6 45.3 42.6 44.3  MCV 104.1* 106.1*  105.4* 105.7*  PLT 184 189 187 162   Basic Metabolic Panel: Recent Labs  Lab 02/26/2019 1931 03/19/19 0354 03/20/19 0434 03/21/19 0324  NA 140 140 146* 145  K 3.4* 3.9 3.5 3.6  CL 101 104 111 110  CO2 _0 GLUCOSE 134* 145* 187* 160*  BUN 29* 27* 24* 31*  CREATININE 0.98 0.97 0.89 1.16*  CALCIUM 8.7* 8.3* 8.6* 9.0  MG  --  2.1  --   --    GFR: Estimated Creatinine Clearance: 24.8 mL/min (A) (by C-G formula based on SCr of 1.16 mg/dL (H)). Liver Function Tests: Recent Labs  Lab 03/25/2019 1931  AST 16  ALT 10  ALKPHOS 70  BILITOT 0.4  PROT 7.0  ALBUMIN 3.3*   Recent Labs  Lab  02/27/2019 1931  LIPASE 27   No results for input(s): AMMONIA in the last 168 hours. Coagulation Profile: No results for input(s): INR, PROTIME in the last 168 hours. Cardiac Enzymes: No results for input(s): CKTOTAL, CKMB, CKMBINDEX, TROPONINI in the last 168 hours. BNP (last 3 results) No results for input(s): PROBNP in the last 8760 hours. HbA1C: No results for input(s): HGBA1C in the last 72 hours. CBG: Recent Labs  Lab 03/19/19 1044 03/20/19 0732 03/21/19 0731  GLUCAP 93 159* 131*   Lipid Profile: No results for input(s): CHOL, HDL, LDLCALC, TRIG, CHOLHDL, LDLDIRECT in the last 72 hours. Thyroid Function Tests: No results for input(s): TSH, T4TOTAL, FREET4, T3FREE, THYROIDAB in the last 72 hours. Anemia Panel: No results for input(s): VITAMINB12, FOLATE, FERRITIN, TIBC, IRON, RETICCTPCT in the last 72 hours. Sepsis Labs: No results for input(s): PROCALCITON, LATICACIDVEN in the last 168 hours.  Recent Results (from the past 240 hour(s))  SARS Coronavirus 2 (CEPHEID - Performed in [facility] hospital lab), Hosp Order     Status: None   Collection Time: 03/19/19 12:03 AM   Specimen: Nasopharyngeal Swab  Result Value Ref Range Status   SARS Coronavirus 2 NEGATIVE NEGATIVE Final    Comment: (NOTE) If result is NEGATIVE SARS-CoV-2 target nucleic acids are NOT DETECTED. The SARS-CoV-2 RNA is generally detectable in upper and lower  respiratory specimens during the acute phase of infection. The lowest  concentration of SARS-CoV-2 viral copies this assay can detect is 250  copies / mL. A negative result does not preclude SARS-CoV-2 infection  and should not be used as the sole basis for treatment or other  patient management decisions.  A negative result may occur with  improper specimen collection / handling, submission of specimen other  than nasopharyngeal swab, presence of viral mutation(s) within the  areas targeted by this assay, and inadequate number of viral copies    (<250 copies / mL). A negative result must be combined with clinical  observations, patient history, and epidemiological information. If result is POSITIVE SARS-CoV-2 target nucleic acids are DETECTED. The SARS-CoV-2 RNA is generally detectable in upper and lower  respiratory specimens dur ing the acute phase of infection.  Positive  results are indicative of active infection with SARS-CoV-2.  Clinical  correlation with patient history and other diagnostic information is  necessary to determine patient infection status.  Positive results do  not rule out bacterial infection or co-infection with other viruses. If result is PRESUMPTIVE POSTIVE SARS-CoV-2 nucleic acids MAY BE PRESENT.   A presumptive positive result was obtained on the submitted specimen  and confirmed on repeat testing.  While 2019 novel coronavirus  (SARS-CoV-2) nucleic acids may be present  in the submitted sample  additional confirmatory testing may be necessary for epidemiological  and / or clinical management purposes  to differentiate between  SARS-CoV-2 and other Sarbecovirus currently known to infect humans.  If clinically indicated additional testing with an alternate test  methodology [phone]) is advised. The SARS-CoV-2 RNA is generally  detectable in upper and lower respiratory sp ecimens during the acute  phase of infection. The expected result is Negative. Fact Sheet for Patients:  StrictlyIdeas.no Fact Sheet for Healthcare Providers: BankingDealers.co.za This test is not yet approved or cleared by the [place] FDA and has been authorized for detection and/or diagnosis of SARS-CoV-2 by FDA under an Emergency Use Authorization (EUA).  This EUA will remain in effect (meaning this test can be used) for the duration of the COVID-19 declaration under Section 564(b)(1) of the Act, 21 U.S.C. section 360bbb-3(b)(1), unless the authorization is terminated  or revoked sooner. Performed at [facility], [place] [address] [place], [place] [address]   C difficile quick scan w PCR reflex     Status: None   Collection Time: 03/19/19  4:03 AM   Specimen: STOOL  Result Value Ref Range Status   C Diff antigen NEGATIVE NEGATIVE Final   C Diff toxin NEGATIVE NEGATIVE Final   C Diff interpretation No C. difficile detected.  Final    Comment: Performed at [facility], [place] [address] [place], [place] [address]         Radiology Studies: Dg Abd Portable 1v-small Bowel Obstruction Protocol-initial, 8 Hr Delay  Result Date: 03/20/2019 CLINICAL DATA:  Small bowel obstruction, 8 hour delayed film. EXAM: PORTABLE ABDOMEN - 1 VIEW COMPARISON:  Radiograph earlier this day. FINDINGS: There is administered enteric contrast in the ascending, transverse, and rectosigmoid colon. Improved small bowel dilatation from prior exam. Enteric tube remains in place. Minimal residual contrast in the bladder from prior CT. IMPRESSION: Administered enteric contrast throughout the entire colon. Improved small bowel dilatation from prior. Electronically Signed   By: Keith Rake M.D.   On: 03/20/2019 02:16        Scheduled Meds:  apixaban  2.5 mg Oral BID   furosemide  40 mg Intravenous Once   levothyroxine  25 mcg Intravenous Daily   metoprolol tartrate  5 mg Intravenous Q6H   Continuous Infusions:   LOS: 2 days    Time spent: [place], MD [facility]  If 7PM-7AM, please contact night-coverage  03/21/2019, 12:45 PM

## 2019-03-21 NOTE — Progress Notes (Signed)
Rt called to room 1434 . Pt has low sats. Pt placed on 100%NRB and ABG obtained per order.

## 2019-03-21 NOTE — Progress Notes (Signed)
Notified by bedside RN in regards to patient desaturating in the 70s and with Afib RVR. Discussed with RN, Jarrett Soho, to place patient on NRB. Upon arrival patient O2 Sats were in mid 90s but patient is also breathing through her mouth instead of nose. MD Spongberg ordered to remain on NRB. ABG was drawn. Patient does not appear to be in any distress. However, patient is very frail and fragile. Discussed with MD Spongberg in regards to transferring patient to SDU. MD ordered to hold off at this time. Call Rapid Response if patient has change in clinical status.

## 2019-03-22 LAB — CBC WITH DIFFERENTIAL/PLATELET
Abs Immature Granulocytes: 0.08 10*3/uL — ABNORMAL HIGH (ref 0.00–0.07)
Basophils Absolute: 0 10*3/uL (ref 0.0–0.1)
Basophils Relative: 0 %
Eosinophils Absolute: 0 10*3/uL (ref 0.0–0.5)
Eosinophils Relative: 0 %
HCT: 45 % (ref 36.0–46.0)
Hemoglobin: 13.1 g/dL (ref 12.0–15.0)
Immature Granulocytes: 1 %
Lymphocytes Relative: 4 %
Lymphs Abs: 0.6 10*3/uL — ABNORMAL LOW (ref 0.7–4.0)
MCH: 30.3 pg (ref 26.0–34.0)
MCHC: 29.1 g/dL — ABNORMAL LOW (ref 30.0–36.0)
MCV: 103.9 fL — ABNORMAL HIGH (ref 80.0–100.0)
Monocytes Absolute: 0.9 10*3/uL (ref 0.1–1.0)
Monocytes Relative: 6 %
Neutro Abs: 14.2 10*3/uL — ABNORMAL HIGH (ref 1.7–7.7)
Neutrophils Relative %: 89 %
Platelets: 236 10*3/uL (ref 150–400)
RBC: 4.33 MIL/uL (ref 3.87–5.11)
RDW: 12.9 % (ref 11.5–15.5)
WBC: 15.8 10*3/uL — ABNORMAL HIGH (ref 4.0–10.5)
nRBC: 0.1 % (ref 0.0–0.2)

## 2019-03-22 LAB — BASIC METABOLIC PANEL
Anion gap: 10 (ref 5–15)
BUN: 55 mg/dL — ABNORMAL HIGH (ref 8–23)
CO2: 27 mmol/L (ref 22–32)
Calcium: 9 mg/dL (ref 8.9–10.3)
Chloride: 107 mmol/L (ref 98–111)
Creatinine, Ser: 1.31 mg/dL — ABNORMAL HIGH (ref 0.44–1.00)
GFR calc Af Amer: 42 mL/min — ABNORMAL LOW (ref >60)
GFR calc non Af Amer: 36 mL/min — ABNORMAL LOW (ref >60)
Glucose, Bld: 133 mg/dL — ABNORMAL HIGH (ref 70–99)
Potassium: 4.2 mmol/L (ref 3.5–5.1)
Sodium: 144 mmol/L (ref 135–145)

## 2019-03-22 LAB — GLUCOSE, CAPILLARY
Glucose-Capillary: 116 mg/dL — ABNORMAL HIGH (ref 70–99)
Glucose-Capillary: 144 mg/dL — ABNORMAL HIGH (ref 70–99)

## 2019-03-22 MED ORDER — FUROSEMIDE 10 MG/ML IJ SOLN
20.0000 mg | Freq: Once | INTRAMUSCULAR | Status: AC
  Administered 2019-03-22: 20 mg via INTRAVENOUS
  Filled 2019-03-22: qty 2

## 2019-03-22 MED ORDER — LORAZEPAM 2 MG/ML IJ SOLN: 1.0000 mg | INTRAMUSCULAR | Status: DC | PRN

## 2019-03-22 MED ORDER — ALPRAZOLAM 0.5 MG PO TABS
0.5000 mg | ORAL_TABLET | Freq: Once | ORAL | Status: AC
  Administered 2019-03-22: 0.5 mg via ORAL
  Filled 2019-03-22: qty 1

## 2019-03-22 MED ORDER — METOPROLOL TARTRATE 25 MG PO TABS
25.0000 mg | ORAL_TABLET | Freq: Two times a day (BID) | ORAL | Status: DC
  Administered 2019-03-22: 25 mg via ORAL
  Filled 2019-03-22 (×2): qty 1

## 2019-03-22 NOTE — Progress Notes (Signed)
Subjective: CC: SOB Notes reviewed from overnight. Patient became short of breath overnight and was placed back on non-rebreather. She was placed on Zosyn for possible PNA vs atelectasis. She was also found to be in a-fib with RVR and placed on a Diltiazem drip with a now controlled response.   She reports to me she tolerated her FLD yesterday but didn't eat "to much". She reports no abdominal pain, N/V. She is passing flatus. Last BM 6/23  Objective: Vital signs in last 24 hours: Temp:  [97.9 F (36.6 C)-99.3 F (37.4 C)] 98.6 F (37 C) (06/25 0616) Pulse Rate:  [60-184] 99 (06/25 0616) Resp:  [20-32] 20 (06/25 0616) BP: (120-157)/(54-106) 128/57 (06/25 0616) SpO2:  [65 %-94 %] 91 % (06/25 0616) Last BM Date: 03/20/19  Intake/Output from previous day: 06/24 0701 - 06/25 0700 In: 231.8 [P.O.:60; I.V.:134.4; IV Piggyback:37.3] Out: 325 [Urine:325] Intake/Output this shift: No intake/output data recorded.  PE: Gen: Frail, elderly female, on non-rebreather Heart: Irregular rhythm, regular rate Lungs: On Non-re breather. Decreased breath sounds b/l.  Abd: Soft, ND, NT, +BS Msk: DP 2+ b/l.   Lab Results:  Recent Labs    03/21/19 0324 03/22/19 0856  WBC 14.8* 15.8*  HGB 12.9 13.1  HCT 44.3 45.0  PLT 198 236   BMET Recent Labs    03/21/19 0324 03/22/19 0856  NA 145 144  K 3.6 4.2  CL 110 107  CO2 26 27  GLUCOSE 160* 133*  BUN 31* 55*  CREATININE 1.16* 1.31*  CALCIUM 9.0 9.0   PT/INR No results for input(s): LABPROT, INR in the last 72 hours. CMP     Component Value Date/Time   NA 144 03/22/2019 0856   NA 141 10/26/2017 1226   K 4.2 03/22/2019 0856   CL 107 03/22/2019 0856   CO2 27 03/22/2019 0856   GLUCOSE 133 (H) 03/22/2019 0856   BUN 55 (H) 03/22/2019 0856   BUN 34 (H) 10/26/2017 1226   CREATININE 1.31 (H) 03/22/2019 0856   CREATININE 1.18 (H) 02/28/2019 1549   CALCIUM 9.0 03/22/2019 0856   PROT 7.0 03/10/2019 1931   ALBUMIN 3.3 (L)  03/13/2019 1931   AST 16 03/27/2019 1931   ALT 10 03/25/2019 1931   ALKPHOS 70 03/24/2019 1931   BILITOT 0.4 03/04/2019 1931   GFRNONAA 36 (L) 03/22/2019 0856   GFRNONAA 41 (L) 02/28/2019 1549   GFRAA 42 (L) 03/22/2019 0856   GFRAA 48 (L) 02/28/2019 1549   Lipase     Component Value Date/Time   LIPASE 27 03/04/2019 1931       Studies/Results: Dg Chest Port 1 View  Result Date: 03/21/2019 CLINICAL DATA:  Sob worsened this am, pt on 5L O2 EXAM: PORTABLE CHEST - 1 VIEW COMPARISON:  01/31/2019 FINDINGS: Low lung volumes with resultant crowding of bronchovascular structures. Moderate bilateral pleural effusions with worsening consolidation/atelectasis at the lung bases. Heart size difficult to assess due to adjacent opacities. Aortic Atherosclerosis (ICD10-170.0). No pneumothorax. DJD in bilateral shoulders. IMPRESSION: Worsening bilateral pleural effusions and bibasilar atelectasis/consolidation. Electronically Signed   By: Lucrezia Europe M.D.   On: 03/21/2019 12:46    Anti-infectives: Anti-infectives (From admission, onward)   Start     Dose/Rate Route Frequency Ordered Stop   03/21/19 2000  piperacillin-tazobactam (ZOSYN) IVPB 3.375 g     3.375 g 12.5 mL/hr over 240 Minutes Intravenous Every 8 hours 03/21/19 1815         Assessment/Plan HTN COPD CKD,  stage 3 CHF Atrial Fibrillation with RVR on Diltiazem drip Acute respiratory failure requiring non-rebreather  - Per [facility]   SBO - Contrast throughout colon on Xray. Having bowel function. Tolerating diet. Advance diet to soft. - Mobilize as tolerated for bowel function - It appears SBO has resolved. Her issue now appears to be respiratory. We will follow along peripherally and ensure she tolerates a soft diet.   FEN -Soft VTE -SCDs, Eliquis  ID - Zosyn 6/24 >> POC - [name] [phone]   LOS: 3 days    Jillyn Ledger , [facility] 03/22/2019, 10:40 AM Pager: [phone]

## 2019-03-22 NOTE — Progress Notes (Signed)
PROGRESS NOTE    Andrea [name]  EQA:[id number] DOB: 1929-04-02 DOA: 03/19/2019 PCP: Unk Pinto, MD   Brief Narrative:  Brief Narrative:  85 yo female who presented with nausea, vomiting and diarrhea.She does have the significant past medical history for paroxysmal atrial fibrillation, COPD with chronic hypoxic respiratory failure, hypertension and hypothyroidism. Reported about 24 hours of nausea, vomiting and nonbloody diarrhea, persistent, unable to tolerate any p.o. intake, no fevers, no chills. On her initial physical examination blood pressure 119/77, heart rate 114, respiratory rate 16, temperature 97.9, oxygen saturation 100%, her lungs are clear to auscultation bilaterally, heart S1-S2 present, irregularly irregular, abdomen was distended and tender, without rebound or guarding, no lower extremity edema. Sodium 140, potassium 3.4, chloride 101, bicarb 29, glucose 134, BUN 29, creatinine 1.98, lipase 27, AST 16, ALT 10, total bilirubin 0.4, white count 16.8, hemoglobin 12.5, hematocrit 43.6, platelets 184.SARS COVID-19 was negative.Urine analysis negative for infection.CT of the abdomen with small bowel obstruction with transition point visualized in the mid to distal ileum in the right hemipelvis.  Patient was admitted to the hospital for working diagnosis of small bowel obstruction.  06/23. Developed acute hypoxic respiratory failure due to volume overload, required diuresis.   Assessment & Plan:   Principal Problem:   SBO (small bowel obstruction) (HCC) Active Problems:   COPD with emphysema (HCC)   Hypothyroidism   CKD (chronic kidney disease) stage 3, GFR 30-59 ml/min (HCC)   Pulmonary hypertension due to COPD ([facility])   Chronic respiratory failure with hypoxia (HCC)   AF (paroxysmal atrial fibrillation) (HCC)   Watery diarrhea   1. New acute hypoxic respiratory failure, due to volume overload and possible infiltrate. Patient has been on high rate of IV  fluids, isotonic saline (100 ml per H), and developed acute hypoxic with oxygen saturation down to 80's 6/23, on examination positive signs of volume overload. DC IV fluids, she IS RECEIVING of IV furosemide, patient continues to do poorly.  Significant event note describes patient's complications.  Added to Zosyn for possible underlying infiltrate. Discussed again with patient's daughter this morning with concerns for patient's deterioration and possible life-threatening illness.  1. Small bowel obstruction.  As noted, patient had positive bowel movement over last 24 H, no nausea or vomiting, ng output 250 ml. Will continue medical management, surgery following peripherally patient's small bowel obstruction appears to be resolved.  2. Acute dehydration in the setting of CKD stage 3. Resolved and stable renal function with serum cr at 0,86, Na at 146, K 3,6 and serum bicarbonate at 26.  Patient was noted to be over 2-1/2 L continue to diuresis on a as needed basis, chest x-ray did not show obvious edema but did identify pleural effusions as well as possible infiltrate  3. atrial fibrillation RVR.   Patient placed on diltiazem drip, beta-blocker added.  Patient noted to be on both beta-blocker as well as Cardizem 240 CD.  As patient stabilizes and p.o. intake is stable will convert to p.o. medications.  Currently we will continue IV.  4. COPD with chronic hypoxic respiratory failure, chronic bilateral pleural effusion.  Still doing poorly not in exacerbation but does have impaired lung function.  Continue treatment as above.  5. Hypothyroid.On IVlevothyroxine.   DVT prophylaxis: Lovenox SQ  Code Status: DNR    Code Status Orders  (From admission, onward)         Start     Ordered   03/19/19 0237  Do not attempt resuscitation (DNR)  Continuous    Question Answer Comment  In the event of cardiac or respiratory ARREST Do not call a "code blue"   In the event of cardiac or respiratory  ARREST Do not perform Intubation, CPR, defibrillation or ACLS   In the event of cardiac or respiratory ARREST Use medication by any route, position, wound care, and other measures to relive pain and suffering. May use oxygen, suction and manual treatment of airway obstruction as needed for comfort.      03/19/19 0236        Code Status History    Date Active Date Inactive Code Status Order ID Comments User Context   09/14/2017 0029 09/21/2017 2012 Full Code [id number]  Oswald Hillock, MD Inpatient   08/26/2017 1651 09/01/2017 2017 Full Code [id number]  Eber Jones, MD Inpatient   06/28/2017 0111 06/30/2017 1910 Full Code [id number]  Reubin Milan, MD Inpatient   03/31/2017 0045 04/12/2017 1906 Full Code [id number]  Oswald Hillock, MD Inpatient   04/19/2016 2324 04/25/2016 1719 Full Code [id number]  Jani Gravel, MD Inpatient   10/01/2013 1536 10/05/2013 2020 Full Code [id number]  Velvet Bathe, MD Inpatient   Advance Care Planning Activity     Family Communication: Discussed with patient's daughter [name] Disposition Plan:    Patient will remain inpatient for treatment of complex medical problems to include resolving small bowel obstruction, atrial fibrillation with RVR requiring IV medications, acute hypoxic respiratory failure.  Without these treatments patient be at risk of life-threatening clinical deterioration. Consults called: None Admission status: Inpatient   Consultants:   GEN SURG  Procedures:  Dg Shoulder Right  Result Date: 03/12/2019 CLINICAL DATA:  Fall with shoulder pain EXAM: RIGHT SHOULDER - 2+ VIEW COMPARISON:  11/06/2014 FINDINGS: No acute displaced fracture or dislocation. Superior migration of the humeral head with efface subacromial space, consistent with rotator cuff disease. Advanced arthritis of the right shoulder. IMPRESSION: 1. No acute osseous abnormality 2. Advanced arthritis of right shoulder with high-riding humeral head/rotator cuff disease. Electronically  Signed   By: Donavan Foil M.D.   On: 03/06/2019 22:49   Dg Abdomen 1 View  Result Date: 03/19/2019 CLINICAL DATA:  NG tube placement. EXAM: ABDOMEN - 1 VIEW COMPARISON:  CT yesterday. FINDINGS: Tip and side port of the enteric tube below the diaphragm in the stomach. Dilated small bowel again seen. Excreted IV contrast in both renal collecting systems and urinary bladder from prior CT. IMPRESSION: Tip and side port of the enteric tube below the diaphragm in the stomach. Electronically Signed   By: Keith Rake M.D.   On: 03/19/2019 02:35   Ct Abdomen Pelvis W Contrast  Result Date: 03/13/2019 CLINICAL DATA:  Abdominal pain nausea vomiting EXAM: CT ABDOMEN AND PELVIS WITH CONTRAST TECHNIQUE: Multidetector CT imaging of the abdomen and pelvis was performed using the standard protocol following bolus administration of intravenous contrast. CONTRAST:  114m OMNIPAQUE IOHEXOL 300 MG/ML SOLN, 375mOMNIPAQUE IOHEXOL 300 MG/ML SOLN COMPARISON:  Radiograph 01/31/2019, CT 09/13/2017 FINDINGS: Lower chest: Lung bases demonstrate moderate pleural effusions and bilateral lower lobe atelectasis or pneumonia. Mild cardiomegaly with right greater than left atrial enlargement. Hepatobiliary: Perfusion abnormality within the left lobe of the liver as before. No calcified gallstone. No biliary dilatation. Pancreas: Unremarkable. No pancreatic ductal dilatation or surrounding inflammatory changes. Spleen: Normal in size without focal abnormality. Adrenals/Urinary Tract: Adrenal glands are within normal limits. No hydronephrosis. Multiple subcentimeter cortical hypodense renal lesions too small to further characterize. Slightly complicated cyst at  the lower poles. The bladder is unremarkable Stomach/Bowel: Stomach is nonenlarged. Multiple dilated fluid-filled loops of small bowel within the lower abdomen and pelvis with dilated bowel measuring up to 4.7 cm. Transition point in the right pelvis, mid to distal small bowel,  series 2, image number 55 with decompressed small bowel distal to this. No intramural air. No bowel wall thickening. Extensive sigmoid colon diverticular disease. The colon is collapsed. Vascular/Lymphatic: Moderate aortic atherosclerosis without aneurysm. No significant adenopathy. Reproductive: Status post hysterectomy. No adnexal masses. Other: Negative for free air. Small amount of ascites in the right lower quadrant near the transition point. Musculoskeletal: Scoliosis and degenerative changes of the spine. No acute osseous abnormality. IMPRESSION: 1. Findings consistent with small bowel obstruction with transition point visualized in the mid to distal ileum in the right hemipelvis. Findings probably due to adhesions. Small amount of ascites within the right lower mesentery near the transition point. Negative for intramural air or free air. 2. Moderate pleural effusions with basilar atelectasis or pneumonia. Cardiomegaly. 3. Multiple hypodense renal lesions, too small to further characterize, probable small cysts and complicated cysts. 4. Sigmoid colon diverticular disease without acute inflammatory change Electronically Signed   By: Donavan Foil M.D.   On: 03/06/2019 23:35   Dg Chest Port 1 View  Result Date: 03/21/2019 CLINICAL DATA:  Sob worsened this am, pt on 5L O2 EXAM: PORTABLE CHEST - 1 VIEW COMPARISON:  01/31/2019 FINDINGS: Low lung volumes with resultant crowding of bronchovascular structures. Moderate bilateral pleural effusions with worsening consolidation/atelectasis at the lung bases. Heart size difficult to assess due to adjacent opacities. Aortic Atherosclerosis (ICD10-170.0). No pneumothorax. DJD in bilateral shoulders. IMPRESSION: Worsening bilateral pleural effusions and bibasilar atelectasis/consolidation. Electronically Signed   By: Lucrezia Europe M.D.   On: 03/21/2019 12:46   Dg Abd Portable 1v-small Bowel Obstruction Protocol-initial, 8 Hr Delay  Result Date: 03/20/2019 CLINICAL  DATA:  Small bowel obstruction, 8 hour delayed film. EXAM: PORTABLE ABDOMEN - 1 VIEW COMPARISON:  Radiograph earlier this day. FINDINGS: There is administered enteric contrast in the ascending, transverse, and rectosigmoid colon. Improved small bowel dilatation from prior exam. Enteric tube remains in place. Minimal residual contrast in the bladder from prior CT. IMPRESSION: Administered enteric contrast throughout the entire colon. Improved small bowel dilatation from prior. Electronically Signed   By: Keith Rake M.D.   On: 03/20/2019 02:16     Antimicrobials:   ZOSYN DAY 2   Subjective: Patient did poorly overnight with A. fib RVR, hypoxic respiratory failure and general decline.  Objective: Vitals:   03/21/19 2314 03/22/19 0014 03/22/19 0506 03/22/19 0616  BP: 124/80 124/69  (!) 128/57  Pulse: (!) 119 88 (!) 118 99  Resp:  20  20  Temp:  97.9 F (36.6 C)  98.6 F (37 C)  TempSrc:  Oral    SpO2: 90% (!) 89%  91%  Weight:      Height:        Intake/Output Summary (Last 24 hours) at 03/22/2019 1208 Last data filed at 03/22/2019 0500 Gross per 24 hour  Intake 171.75 ml  Output 300 ml  Net -128.25 ml   Filed Weights   03/19/19 0238 03/20/19 0500 03/21/19 0500  Weight: 45.8 kg 46.2 kg 46.9 kg    Examination:  General exam: Ill-appearing Respiratory system: C rhonchi bilaterally, labored breathing Cardiovascular system: Irregularly irregular with rapid rate  gastrointestinal system: Thin, cachectic distant bowel sounds Central nervous system: Alert. No focal neurological deficits. Extremities: Cachectic, but warm  well perfused  skin: No rashes, lesions or ulcers Psychiatry: Judgement and insight impaired.     Data Reviewed: I have personally reviewed following labs and imaging studies  CBC: Recent Labs  Lab 02/28/2019 1931 03/19/19 0354 03/20/19 0434 03/21/19 0324 03/22/19 0856  WBC 16.8* 15.6* 13.1* 14.8* 15.8*  NEUTROABS 14.9* 13.6* 11.6* 13.2* 14.2*   HGB 12.5 12.7 12.3 12.9 13.1  HCT 43.6 45.3 42.6 44.3 45.0  MCV 104.1* 106.1* 105.4* 105.7* 103.9*  PLT 184 189 187 198 449   Basic Metabolic Panel: Recent Labs  Lab 03/08/2019 1931 03/19/19 0354 03/20/19 0434 03/21/19 0324 03/22/19 0856  NA 140 140 146* 145 144  K 3.4* 3.9 3.5 3.6 4.2  CL 101 104 111 110 107  CO2 _0 GLUCOSE 134* 145* 187* 160* 133*  BUN 29* 27* 24* 31* 55*  CREATININE 0.98 0.97 0.89 1.16* 1.31*  CALCIUM 8.7* 8.3* 8.6* 9.0 9.0  MG  --  2.1  --   --   --    GFR: Estimated Creatinine Clearance: 22 mL/min (A) (by C-G formula based on SCr of 1.31 mg/dL (H)). Liver Function Tests: Recent Labs  Lab 02/26/2019 1931  AST 16  ALT 10  ALKPHOS 70  BILITOT 0.4  PROT 7.0  ALBUMIN 3.3*   Recent Labs  Lab 03/01/2019 1931  LIPASE 27   No results for input(s): AMMONIA in the last 168 hours. Coagulation Profile: No results for input(s): INR, PROTIME in the last 168 hours. Cardiac Enzymes: No results for input(s): CKTOTAL, CKMB, CKMBINDEX, TROPONINI in the last 168 hours. BNP (last 3 results) No results for input(s): PROBNP in the last 8760 hours. HbA1C: No results for input(s): HGBA1C in the last 72 hours. CBG: Recent Labs  Lab 03/19/19 1044 03/20/19 0732 03/21/19 0731 03/22/19 0753  GLUCAP 93 159* 131* 116*   Lipid Profile: No results for input(s): CHOL, HDL, LDLCALC, TRIG, CHOLHDL, LDLDIRECT in the last 72 hours. Thyroid Function Tests: No results for input(s): TSH, T4TOTAL, FREET4, T3FREE, THYROIDAB in the last 72 hours. Anemia Panel: No results for input(s): VITAMINB12, FOLATE, FERRITIN, TIBC, IRON, RETICCTPCT in the last 72 hours. Sepsis Labs: No results for input(s): PROCALCITON, LATICACIDVEN in the last 168 hours.  Recent Results (from the past 240 hour(s))  SARS Coronavirus 2 (CEPHEID - Performed in [facility] hospital lab), Hosp Order     Status: None   Collection Time: 03/19/19 12:03 AM   Specimen: Nasopharyngeal Swab  Result  Value Ref Range Status   SARS Coronavirus 2 NEGATIVE NEGATIVE Final    Comment: (NOTE) If result is NEGATIVE SARS-CoV-2 target nucleic acids are NOT DETECTED. The SARS-CoV-2 RNA is generally detectable in upper and lower  respiratory specimens during the acute phase of infection. The lowest  concentration of SARS-CoV-2 viral copies this assay can detect is 250  copies / mL. A negative result does not preclude SARS-CoV-2 infection  and should not be used as the sole basis for treatment or other  patient management decisions.  A negative result may occur with  improper specimen collection / handling, submission of specimen other  than nasopharyngeal swab, presence of viral mutation(s) within the  areas targeted by this assay, and inadequate number of viral copies  (<250 copies / mL). A negative result must be combined with clinical  observations, patient history, and epidemiological information. If result is POSITIVE SARS-CoV-2 target nucleic acids are DETECTED. The SARS-CoV-2 RNA is generally detectable in upper and lower  respiratory  specimens dur ing the acute phase of infection.  Positive  results are indicative of active infection with SARS-CoV-2.  Clinical  correlation with patient history and other diagnostic information is  necessary to determine patient infection status.  Positive results do  not rule out bacterial infection or co-infection with other viruses. If result is PRESUMPTIVE POSTIVE SARS-CoV-2 nucleic acids MAY BE PRESENT.   A presumptive positive result was obtained on the submitted specimen  and confirmed on repeat testing.  While 2019 novel coronavirus  (SARS-CoV-2) nucleic acids may be present in the submitted sample  additional confirmatory testing may be necessary for epidemiological  and / or clinical management purposes  to differentiate between  SARS-CoV-2 and other Sarbecovirus currently known to infect humans.  If clinically indicated additional testing  with an alternate test  methodology [phone]) is advised. The SARS-CoV-2 RNA is generally  detectable in upper and lower respiratory sp ecimens during the acute  phase of infection. The expected result is Negative. Fact Sheet for Patients:  StrictlyIdeas.no Fact Sheet for Healthcare Providers: BankingDealers.co.za This test is not yet approved or cleared by the [place] FDA and has been authorized for detection and/or diagnosis of SARS-CoV-2 by FDA under an Emergency Use Authorization (EUA).  This EUA will remain in effect (meaning this test can be used) for the duration of the COVID-19 declaration under Section 564(b)(1) of the Act, 21 U.S.C. section 360bbb-3(b)(1), unless the authorization is terminated or revoked sooner. Performed at [facility], [place] [address] [place], [place] [address]   C difficile quick scan w PCR reflex     Status: None   Collection Time: 03/19/19  4:03 AM   Specimen: STOOL  Result Value Ref Range Status   C Diff antigen NEGATIVE NEGATIVE Final   C Diff toxin NEGATIVE NEGATIVE Final   C Diff interpretation No C. difficile detected.  Final    Comment: Performed at [facility], [place] [address], [place], [place] [address]         Radiology Studies: Dg Chest Port 1 View  Result Date: 03/21/2019 CLINICAL DATA:  Sob worsened this am, pt on 5L O2 EXAM: PORTABLE CHEST - 1 VIEW COMPARISON:  01/31/2019 FINDINGS: Low lung volumes with resultant crowding of bronchovascular structures. Moderate bilateral pleural effusions with worsening consolidation/atelectasis at the lung bases. Heart size difficult to assess due to adjacent opacities. Aortic Atherosclerosis (ICD10-170.0). No pneumothorax. DJD in bilateral shoulders. IMPRESSION: Worsening bilateral pleural effusions and bibasilar atelectasis/consolidation. Electronically Signed   By: Lucrezia Europe M.D.   On: 03/21/2019 12:46         Scheduled Meds: . apixaban  2.5 mg Oral BID  . levothyroxine  25 mcg Intravenous Daily  . metoprolol tartrate  25 mg Oral BID   Continuous Infusions: . diltiazem (CARDIZEM) infusion 15 mg/hr (03/22/19 1139)  . piperacillin-tazobactam (ZOSYN)  IV 3.375 g (03/22/19 0535)     LOS: 3 days    Time spent: 1 MIN    Nicolette Bang, MD [facility]  If 7PM-7AM, please contact night-coverage  03/22/2019, 12:08 PM

## 2019-03-22 NOTE — Care Management Important Message (Signed)
Important Message  Patient Details IM Letter given to Sharren Bridge SW to present to the Patient Name: Andrea [name] MRN: [id number] Date of Birth: Dec 02, 1928   Medicare Important Message Given:  Yes     Kerin Salen 03/22/2019, 9:51 AM

## 2019-03-22 NOTE — Discharge Instructions (Signed)

## 2019-03-22 NOTE — Progress Notes (Signed)
Report received from B. Helene Kelp, Therapist, sports. Patient continues to have shortness of breath and remain on a nonrebreather. She is still in AFIB, however, rate is a little better controlled. Will continue to monitor and follow the POC.

## 2019-03-23 DIAGNOSIS — J9601 Acute respiratory failure with hypoxia: Secondary | ICD-10-CM

## 2019-03-23 DIAGNOSIS — J9602 Acute respiratory failure with hypercapnia: Secondary | ICD-10-CM

## 2019-03-23 LAB — BLOOD GAS, ARTERIAL
Acid-base deficit: 4.4 mmol/L — ABNORMAL HIGH (ref 0.0–2.0)
Bicarbonate: 29.5 mmol/L — ABNORMAL HIGH (ref 20.0–28.0)
Drawn by: 441261
FIO2: 100
O2 Saturation: 96.8 %
Patient temperature: 98.6
pCO2 arterial: 108 mmHg (ref 32.0–48.0)
pH, Arterial: 7.063 — CL (ref 7.350–7.450)
pO2, Arterial: 112 mmHg — ABNORMAL HIGH (ref 83.0–108.0)

## 2019-03-23 LAB — GLUCOSE, CAPILLARY: Glucose-Capillary: 113 mg/dL — ABNORMAL HIGH (ref 70–99)

## 2019-03-23 MED ORDER — DILTIAZEM HCL 30 MG PO TABS
30.0000 mg | ORAL_TABLET | Freq: Three times a day (TID) | ORAL | Status: DC
  Filled 2019-03-23: qty 1

## 2019-03-28 NOTE — Significant Event (Addendum)
Rapid Response Event Note  Overview: Time Called: 0753 Arrival Time: 0757 Event Type: Respiratory Notified by Respiratory Therapist, [name]. Patient was not breathing well and unresponsive. Asked for Rapid Response to bedside and also ABG kit. Upon arrival to room, patient was unresponsive, agonally breathing, and on non-re breather. Patient previously DNR, daughter wants everything to be done for her mother at this time. Initial Focused Assessment: Neuro: Patient unable to respond to pain or any stimuli, eyes fixed.  Cardiac: Afib but controlled rate in 80s. Palpable pulses in radial and pedal locations bilaterally. BP hypotensive in the 80s systolic.  Pulmonary: Patient on NRB, O2 Sats between mid 80s-90s. Patient respirations 8-10. Patient appeared to breathing agonally.   Interventions: Started a 500 cc bolus, checked recent CBG Ordered for ABG to be drawn.  Notified MD Spongberg in regards to the situation Placed defib-pads on patient, placed patient on rapid response telemetry  Transfer Patient down to ICU for possible intubation  Plan of Care (if not transferred):      Andrea [name] 

## 2019-03-28 NOTE — Consult Note (Signed)
NAME:  NAVDEEP FESSENDEN, MRN:  [id number], DOB:  [birth year], LOS: 4 ADMISSION DATE:  02/26/2019, CONSULTATION DATE:  04-20-2019 REFERRING MD:  Wyonia Hough, CHIEF COMPLAINT:  AMS, respiratory failure  Brief History   85yo F admitted to medical floor 6/22 with N/V/D secondary to SBO, resolved with medical management. On 6/26 RRT for AMS, hypotension and respiratory distress, hypoxic and hypercarbic ABG 7.063 / 108 / 112 / 29.5. Pt was DNR. Family at bedside rescinded. Transferred to ICU for possible intubation and mechanical ventilation.   History of present illness   Assata G. Mcwethy presented to ED 6/22 with N/V/D that began 6/21 with recurrent episodes, unable to tolerate PO, associated with generalized abdominal discomfort. No fever, chills, chest pain, cough or SOB. W/U included CT ABD/Pelivs which revealed concern for small bowel obstruction with transition point near the distal ileum. She has a h/o previous SBO and admissions at [facility]. In the past hese have resolved without surgical intervention. She also has h/o C Diff which was negative this admission. Surgery was consulted and she was admitted for medical management. SBO resolved and the pt was taking PO and having BMs.   On the morning of 6/26 RRT for AMS, hypotension and respiratory distress, hypoxic and hypercarbic ABG 7.063 / 108 / 112 / 29.5. Pt was DNR. Family at bedside rescinded. Transferred to ICU for possible intubation and mechanical ventilation.   On arrival to ICU pt obtunded with poor respiratory effort. Attempted brief BiPAP but patients SpO2 dropped to 30's.  BVM utilized with improvement in saturations.  Patient with minimal response to physical stimuli.  Family brought to ICU, discussion with husband & daughters. Reviewed patients overall medical state and acute change prompting ICU transfer.  Family had previously reversed intubation status.  Pilar Plate discussion regarding the benefits of mechanical ventilation and that it likely would not  add to her improvement but only her suffering.  Family states that the do not want her to suffer.  We reviewed plan to attempt non-invasive measures for support.  and agreed that if she declines despite this, we will transition to full comfort measures.  DNR in place.  Despite these measures /p transfer the patient declined and became apneic and asystolic and died peacefully with her family at beside.    Past Medical History  PAF on Eliquis COPD Chronic hypoxic respiratory failure HTN Hypothyroidism Recurrent SBOs Bronchiectasis   Significant Hospital Events   See above  Consults:  Surgery   Procedures:  None  Significant Diagnostic Tests:  CT ABD pelvis 6/21: "Findings consistent with small bowel obstruction with transition point visualized in the mid to distal ileum in the right hemipelvis. Findings probably due to adhesions. Small amount of ascites within the right lower mesentery near the transition point. Negative for intramural air or free air. Moderate pleural effusions with basilar atelectasis or pneumonia. Cardiomegaly."   Objective   Blood pressure (!) 92/45, pulse 68, temperature (!) 97.2 F (36.2 C), temperature source Tympanic, resp. rate 16, height _0  (1.6 m), weight 54.5 kg, SpO2 94 %.        Intake/Output Summary (Last 24 hours) at 04/20/2019 0915 Last data filed at 20-Apr-2019 0617 Gross per 24 hour  Intake 629.55 ml  Output 100 ml  Net 529.55 ml   Filed Weights   03/20/19 0500 03/21/19 0500 04-20-19 9381  Weight: 46.2 kg 46.9 kg 54.5 kg    Examination on arrival to ICU: General: cachectic, elderly HENT: sunken orbits, tympanic wasting Lungs: diminished  lung sounds, shallow effort Cardiovascular: IRRR Abdomen: + BS x4, SNT/ND Extremities: trace edema Neuro: minimally grimaces to noxious stimuli GU: deferred    Assessment & Plan:  Acute on chronic hypoxic and hypercarbic respiratory failure  As noted family opted for trial of non  invasive measures which were unsuccessful. Patient expired at 39.   D/W Dr Wyonia Hough in ICU.    Labs   CBC: Recent Labs  Lab 03/21/2019 1931 03/19/19 0354 03/20/19 0434 03/21/19 0324 03/22/19 0856  WBC 16.8* 15.6* 13.1* 14.8* 15.8*  NEUTROABS 14.9* 13.6* 11.6* 13.2* 14.2*  HGB 12.5 12.7 12.3 12.9 13.1  HCT 43.6 45.3 42.6 44.3 45.0  MCV 104.1* 106.1* 105.4* 105.7* 103.9*  PLT 184 189 187 198 621    Basic Metabolic Panel: Recent Labs  Lab 03/04/2019 1931 03/19/19 0354 03/20/19 0434 03/21/19 0324 03/22/19 0856  NA 140 140 146* 145 144  K 3.4* 3.9 3.5 3.6 4.2  CL 101 104 111 110 107  CO2 _0 GLUCOSE 134* 145* 187* 160* 133*  BUN 29* 27* 24* 31* 55*  CREATININE 0.98 0.97 0.89 1.16* 1.31*  CALCIUM 8.7* 8.3* 8.6* 9.0 9.0  MG  --  2.1  --   --   --    GFR: Estimated Creatinine Clearance: 24.6 mL/min (A) (by C-G formula based on SCr of 1.31 mg/dL (H)). Recent Labs  Lab 03/19/19 0354 03/20/19 0434 03/21/19 0324 03/22/19 0856  WBC 15.6* 13.1* 14.8* 15.8*    Liver Function Tests: Recent Labs  Lab 03/10/2019 1931  AST 16  ALT 10  ALKPHOS 70  BILITOT 0.4  PROT 7.0  ALBUMIN 3.3*   Recent Labs  Lab 03/10/2019 1931  LIPASE 27   No results for input(s): AMMONIA in the last 168 hours.  ABG    Component Value Date/Time   PHART 7.063 (LL) Apr 08, 2019 0758   PCO2ART 108 (HH) Apr 08, 2019 0758   PO2ART 112 (H) 2019/04/08 0758   HCO3 29.5 (H) 04/08/19 0758   TCO2 29 09/20/2007 1808   ACIDBASEDEF 4.4 (H) 04/08/2019 0758   O2SAT 96.8 2019/04/08 0758     Coagulation Profile: No results for input(s): INR, PROTIME in the last 168 hours.  Cardiac Enzymes: No results for input(s): CKTOTAL, CKMB, CKMBINDEX, TROPONINI in the last 168 hours.  HbA1C: Hgb A1c MFr Bld  Date/Time Value Ref Range Status  09/14/2018 03:55 PM 6.1 (H) <5.7 % of total Hgb Final    Comment:    For someone without known diabetes, a hemoglobin  A1c value between 5.7% and 6.4% is  consistent with prediabetes and should be confirmed with a  follow-up test. . For someone with known diabetes, a value <7% indicates that their diabetes is well controlled. A1c targets should be individualized based on duration of diabetes, age, comorbid conditions, and other considerations. . This assay result is consistent with an increased risk of diabetes. . Currently, no consensus exists regarding use of hemoglobin A1c for diagnosis of diabetes for children. [name]   05/24/2018 11:43 AM 5.9 (H) <5.7 % of total Hgb Final    Comment:    For someone without known diabetes, a hemoglobin  A1c value between 5.7% and 6.4% is consistent with prediabetes and should be confirmed with a  follow-up test. . For someone with known diabetes, a value <7% indicates that their diabetes is well controlled. A1c targets should be individualized based on duration of diabetes, age, comorbid conditions, and other considerations. . This assay result is consistent  with an increased risk of diabetes. . Currently, no consensus exists regarding use of hemoglobin A1c for diagnosis of diabetes for children. .     CBG: Recent Labs  Lab 03/20/19 0732 03/21/19 0731 03/22/19 0753 03/22/19 1936 2019/03/31 0746  GLUCAP 159* 131* 116* 144* 113*    Review of Systems:   Negative except as noted in HPI.   Past Medical History  She,  has a past medical history of Aortic regurgitation, Bronchiectasis ([place]), COPD (chronic obstructive pulmonary disease) ([place]), DJD (degenerative joint disease), HLD (hyperlipidemia), HTN (hypertension), Hypothyroidism, Palpitations, and S/P thoracentesis.   Surgical History    Past Surgical History:  Procedure Laterality Date  . ABDOMINAL HYSTERECTOMY  1993  . CATARACT EXTRACTION, BILATERAL    . WOUND DEBRIDEMENT Right 06/29/2017   Procedure: RIGHT LOWER  LEG DEBRIDEMENT;  Surgeon: [name], MD;  Location: [facility];  Service: General;  Laterality: Right;      Social History   reports that she quit smoking about 44 years ago. Her smoking use included cigarettes. She has a 20.00 pack-year smoking history. She has never used smokeless tobacco. She reports that she does not drink alcohol or use drugs.   Family History   Her family history includes Asthma in her brother; Cancer in her father; Diabetes in her brother, mother, and sister; Emphysema in her mother, sister, and sister; Heart attack in her sister; Rheumatic fever in her sister.   Allergies Allergies  Allergen Reactions  . Vancomycin     Worsening kidney function  . Sulfa Antibiotics Palpitations    Unknown     Home Medications  Prior to Admission medications   Medication Sig Start Date End Date Taking? Authorizing Provider  acetaZOLAMIDE (DIAMOX) 250 MG tablet TAKE ONE TABLET (250MG TOTAL) BY MOUTH DAILY Patient taking differently: Take 250 mg by mouth daily. TAKE ONE TABLET (250MG TO 03/08/19  Yes Unk Pinto, MD  ALPRAZolam (XANAX) 0.5 MG tablet TAKE ONE-HALF TABLET TO ONE TABLET BY MOUTH TWO TO THREE TIMES DAILY FOR NERVES OR SLEEP. MAY CAUSE DROWSINESS. Patient taking differently: Take 0.25-0.5 mg by mouth 3 (three) times daily as needed for anxiety or sleep.  02/27/19  Yes Vicie Mutters, PA-C  apixaban (ELIQUIS) 2.5 MG TABS tablet Take 1 tablet (2.5 mg total) by mouth 2 (two) times daily. 11/15/17  Yes Lelon Perla, MD  Cholecalciferol 50 MCG (2000 UT) CAPS Take 1 capsule (2,000 Units total) by mouth daily. 09/15/18  Yes Liane Comber, NP  diltiazem (CARDIZEM CD) 240 MG 24 hr capsule Take 1 capsule (240 mg total) by mouth daily. 08/14/18 03/19/19 Yes Lelon Perla, MD  furosemide (LASIX) 40 MG tablet TAKE ONE TABLET (40MG TOTAL) BY MOUTH TWICE DAILY Patient taking differently: Take 40 mg by mouth 2 (two) times daily as needed for fluid.  12/20/18  Yes Vicie Mutters, PA-C  ipratropium-albuterol (DUONEB) 0.5-2.5 (3) MG/3ML SOLN Take 3 mLs by nebulization QID. Patient  taking differently: Take 3 mLs by nebulization every 6 (six) hours as needed (SOB, wheezing).  02/15/18  Yes Unk Pinto, MD  levothyroxine (SYNTHROID, LEVOTHROID) 50 MCG tablet TAKE ONE TABLET BY MOUTH ONCE A DAY AS DIRECTED Patient taking differently: Take 50 mcg by mouth daily before breakfast.  11/06/18  Yes Unk Pinto, MD  metoprolol tartrate (LOPRESSOR) 50 MG tablet TAKE ONE TABLET (50MG TOTAL) BY MOUTH TWICE DAILY Patient taking differently: Take 50 mg by mouth.  11/30/18  Yes Unk Pinto, MD  potassium chloride (K-DUR) 10 MEQ tablet  TAKE ONE TABLET (10MEQ TOTAL) BY MOUTH DAILY Patient taking differently: Take 10 mEq by mouth daily.  03/08/19  Yes Almyra Deforest, PA  Probiotic Product (PROBIOTIC-10) CHEW Chew 2 tablets by mouth daily.   Yes [provider]  Respiratory Therapy Supplies (FLUTTER) DEVI Use as directed. 10/19/17  Yes Parrett, Tammy S, NP  cefUROXime (CEFTIN) 250 MG tablet Take 1 tablet (250 mg total) by mouth 2 (two) times daily. Patient not taking: Reported on 03/19/2019 02/01/19   Vicie Mutters, PA-C     Critical care time: 60 minutes in direct patient assessment and stabilization, d/w family, other medical providers and reviewing data. Time is exclusive to this patient.

## 2019-03-28 NOTE — Progress Notes (Signed)
Notified CDS at 1014. Family still at bedside.

## 2019-03-28 NOTE — Progress Notes (Signed)
Patient passing at [phone]. Patient has no heart sounds upon auscultation, no palpable pulses in radial or pedal locations. Patient had no respiratory effort or breath sounds upon auscultation. Patient's pupils not responsive to light. Jamison Neighbor CCM NP verified. Patient family at bedside. Chaplain called to bedside.

## 2019-03-28 NOTE — Progress Notes (Signed)
   Apr 10, 2019 1000  Clinical Encounter Type  Visited With Family;Patient and family together  Visit Type Initial;Critical Care;Death;Spiritual support;Psychological support  Referral From Nurse;Other (Comment) Engineer, petroleum )  Consult/Referral To Nurse  Spiritual Encounters  Spiritual Needs Grief support  Stress Factors  Family Stress Factors Loss

## 2019-03-28 NOTE — Progress Notes (Signed)
Central telemetry monitoring reported that pt had a 3.1 sec pause.  Cardizem drip discontinued per protocol.  Pt resting quietly.  BP 92/49  HR 65.  B. [place] notified.  Awaiting any further orders.

## 2019-03-28 NOTE — Progress Notes (Signed)
Patient ID: Andrea [name], female   DOB: [birth year], 85 y.o.   MRN: [id number]       Subjective: Patient just tx to ICU for agonal respirations with CO2 of 108.  Family has rescinded her DNR to full code as of right now.  She was placed on a soft diet yesterday.    Objective: Vital signs in last 24 hours: Temp:  [97.2 F (36.2 C)-97.9 F (36.6 C)] 97.2 F (36.2 C) (06/26 0630) Pulse Rate:  [65-71] 68 (06/26 0630) Resp:  [16-22] 16 (06/26 0630) BP: (92-113)/(45-59) 92/45 (06/26 0630) SpO2:  [82 %-94 %] 94 % (06/26 0228) Weight:  [54.5 kg] 54.5 kg (06/26 7106) Last BM Date: (03/19/2019)  Intake/Output from previous day: 06/25 0701 - 06/26 0700 In: 689.6 [P.O.:160; I.V.:367; IV Piggyback:162.6] Out: 100 [Urine:100] Intake/Output this shift: No intake/output data recorded.  PE: Gen: ill appearing, cachectic  Lungs: being bagged currently Abd: soft, doesn't respond to palpations, ND  Lab Results:  Recent Labs    03/21/19 0324 03/22/19 0856  WBC 14.8* 15.8*  HGB 12.9 13.1  HCT 44.3 45.0  PLT 198 236   BMET Recent Labs    03/21/19 0324 03/22/19 0856  NA 145 144  K 3.6 4.2  CL 110 107  CO2 26 27  GLUCOSE 160* 133*  BUN 31* 55*  CREATININE 1.16* 1.31*  CALCIUM 9.0 9.0   PT/INR No results for input(s): LABPROT, INR in the last 72 hours. CMP     Component Value Date/Time   NA 144 03/22/2019 0856   NA 141 10/26/2017 1226   K 4.2 03/22/2019 0856   CL 107 03/22/2019 0856   CO2 27 03/22/2019 0856   GLUCOSE 133 (H) 03/22/2019 0856   BUN 55 (H) 03/22/2019 0856   BUN 34 (H) 10/26/2017 1226   CREATININE 1.31 (H) 03/22/2019 0856   CREATININE 1.18 (H) 02/28/2019 1549   CALCIUM 9.0 03/22/2019 0856   PROT 7.0 03/02/2019 1931   ALBUMIN 3.3 (L) 03/25/2019 1931   AST 16 03/01/2019 1931   ALT 10 03/20/2019 1931   ALKPHOS 70 03/01/2019 1931   BILITOT 0.4 03/17/2019 1931   GFRNONAA 36 (L) 03/22/2019 0856   GFRNONAA 41 (L) 02/28/2019 1549   GFRAA 42 (L) 03/22/2019  0856   GFRAA 48 (L) 02/28/2019 1549   Lipase     Component Value Date/Time   LIPASE 27 03/10/2019 1931       Studies/Results: Dg Chest Port 1 View  Result Date: 03/21/2019 CLINICAL DATA:  Sob worsened this am, pt on 5L O2 EXAM: PORTABLE CHEST - 1 VIEW COMPARISON:  01/31/2019 FINDINGS: Low lung volumes with resultant crowding of bronchovascular structures. Moderate bilateral pleural effusions with worsening consolidation/atelectasis at the lung bases. Heart size difficult to assess due to adjacent opacities. Aortic Atherosclerosis (ICD10-170.0). No pneumothorax. DJD in bilateral shoulders. IMPRESSION: Worsening bilateral pleural effusions and bibasilar atelectasis/consolidation. Electronically Signed   By: Lucrezia Europe M.D.   On: 03/21/2019 12:46    Anti-infectives: Anti-infectives (From admission, onward)   Start     Dose/Rate Route Frequency Ordered Stop   03/21/19 2000  piperacillin-tazobactam (ZOSYN) IVPB 3.375 g     3.375 g 12.5 mL/hr over 240 Minutes Intravenous Every 8 hours 03/21/19 1815         Assessment/Plan HTN COPD - now with CO2 of 108  CKD, stage 3 CHF Atrial Fibrillation with RVR on Diltiazem drip Acute respiratory failure - initially rescinded DNR to full code, but as i'm writing  note family has decided to not proceed with intubation.  SBO - SBO resolved. -no need for surgery -if she wakes up, she can have a diet. -given current state patient would not tolerate an operation if she were to ever need one.   -we will sign off at this time  FEN -Soft as able VTE -SCDs,Eliquis ID - Zosyn 6/24 >> POC - Andrea [name] [phone]   LOS: 4 days    Henreitta Cea , [facility] Surgery 04/01/19, 8:56 AM Pager: [phone]

## 2019-03-28 NOTE — Progress Notes (Signed)
PROGRESS NOTE    Andrea [name]  UVO:[id number] DOB: 01/27/1929 DOA: 03/10/2019 PCP: Unk Pinto, MD   Brief Narrative:  85 yo female who presented with nausea, vomiting and diarrhea.She does have the significant past medical history for paroxysmal atrial fibrillation, COPD with chronic hypoxic respiratory failure, hypertension and hypothyroidism. Reported about 24 hours of nausea, vomiting and nonbloody diarrhea, persistent, unable to tolerate any p.o. intake, no fevers, no chills. On her initial physical examination blood pressure 119/77, heart rate 114, respiratory rate 16, temperature 97.9, oxygen saturation 100%, her lungs are clear to auscultation bilaterally, heart S1-S2 present, irregularly irregular, abdomen was distended and tender, without rebound or guarding, no lower extremity edema. Sodium 140, potassium 3.4, chloride 101, bicarb 29, glucose 134, BUN 29, creatinine 1.98, lipase 27, AST 16, ALT 10, total bilirubin 0.4, white count 16.8, hemoglobin 12.5, hematocrit 43.6, platelets 184.SARS COVID-19 was negative.Urine analysis negative for infection.CT of the abdomen with small bowel obstruction with transition point visualized in the mid to distal ileum in the right hemipelvis.  Patient was admitted to the hospital for working diagnosis of small bowel obstruction.  06/23. Developed acute hypoxic respiratory failure due to volume overload, required diuresis.   Assessment & Plan:   Principal Problem:   SBO (small bowel obstruction) (HCC) Active Problems:   COPD with emphysema (HCC)   Hypothyroidism   CKD (chronic kidney disease) stage 3, GFR 30-59 ml/min (HCC)   Pulmonary hypertension due to COPD ([place])   Chronic respiratory failure with hypoxia (HCC)   AF (paroxysmal atrial fibrillation) (HCC)   Watery diarrhea   1. New acute hypoxic respiratory failure, due to volume overload and possible infiltrate. As below, pt doing poorlyunable to protect airway resp  acidosis, anticipated intubation Previously, patienthad been on high rate of IV fluids, isotonic saline (18m/h with body weight of 46kg),anddeveloped acute hypoxic with oxygen saturation down to 80's6/23, on examination positive signs of volume overload, fluids were dc'd when I took over care, pt was diuresed  Significant event note describes patient's complications previous night.  Added to Zosyn 6./24 for possible underlying infiltrate.  Patient with rapid response 6/26 please see significant event note.  Family has reversed DNR status and requested full care in ICU to include intubation pressors central line and CPR if needed  1. Small bowel obstruction.As noted, patient had positive bowel movement over without nausea or vomiting,  surgery following peripherally patient's small bowel obstruction appears to be resolved.  2. Acute dehydration in the setting of CKD stage 3. BUN/creatinine 55 and 1.3 I setting of recent diuresis, transfer to icu, will need fluid resus with sepsis to be determined by PCCM  3. atrial fibrillationRVR. Pt with 3.1 second pause overnight, dilt held, will hold all bp meds given new hypotension   4. COPD with chronic hypoxic respiratory failure, chronic bilateral pleural effusion.   Currently in resp failure, unable to protect airway, will need intubation  5. Hypothyroid.On IVlevothyroxine.   DVT prophylaxis: Lovenox SQ  Code Status: Full code family changed today    Code Status Orders  (From admission, onward)         Start     Ordered   03/19/19 0237  Do not attempt resuscitation (DNR)  Continuous    Question Answer Comment  In the event of cardiac or respiratory ARREST Do not call a code blue   In the event of cardiac or respiratory ARREST Do not perform Intubation, CPR, defibrillation or ACLS   In the event of  cardiac or respiratory ARREST Use medication by any route, position, wound care, and other measures to relive pain and  suffering. May use oxygen, suction and manual treatment of airway obstruction as needed for comfort.      03/19/19 0236        Code Status History    Date Active Date Inactive Code Status Order ID Comments User Context   09/14/2017 0029 09/21/2017 2012 Full Code [id number]  Oswald Hillock, MD Inpatient   08/26/2017 1651 09/01/2017 2017 Full Code [id number]  Eber Jones, MD Inpatient   06/28/2017 0111 06/30/2017 1910 Full Code [id number]  Reubin Milan, MD Inpatient   03/31/2017 0045 04/12/2017 1906 Full Code [id number]  Oswald Hillock, MD Inpatient   04/19/2016 2324 04/25/2016 1719 Full Code [id number]  Jani Gravel, MD Inpatient   10/01/2013 1536 10/05/2013 2020 Full Code [id number]  Velvet Bathe, MD Inpatient   Advance Care Planning Activity     Family Communication: With patient's daughter [name] as well as [name] today. Disposition Plan:   Transfer to ICU Consults called: critical care Admission status: Inpatient   Consultants:   gen surg, critical care  Procedures:  Dg Shoulder Right  Result Date: 03/21/2019 CLINICAL DATA:  Fall with shoulder pain EXAM: RIGHT SHOULDER - 2+ VIEW COMPARISON:  11/06/2014 FINDINGS: No acute displaced fracture or dislocation. Superior migration of the humeral head with efface subacromial space, consistent with rotator cuff disease. Advanced arthritis of the right shoulder. IMPRESSION: 1. No acute osseous abnormality 2. Advanced arthritis of right shoulder with high-riding humeral head/rotator cuff disease. Electronically Signed   By: Donavan Foil M.D.   On: 03/17/2019 22:49   Dg Abdomen 1 View  Result Date: 03/19/2019 CLINICAL DATA:  NG tube placement. EXAM: ABDOMEN - 1 VIEW COMPARISON:  CT yesterday. FINDINGS: Tip and side port of the enteric tube below the diaphragm in the stomach. Dilated small bowel again seen. Excreted IV contrast in both renal collecting systems and urinary bladder from prior CT. IMPRESSION: Tip and side port of the enteric tube  below the diaphragm in the stomach. Electronically Signed   By: Keith Rake M.D.   On: 03/19/2019 02:35   Ct Abdomen Pelvis W Contrast  Result Date: 03/05/2019 CLINICAL DATA:  Abdominal pain nausea vomiting EXAM: CT ABDOMEN AND PELVIS WITH CONTRAST TECHNIQUE: Multidetector CT imaging of the abdomen and pelvis was performed using the standard protocol following bolus administration of intravenous contrast. CONTRAST:  198m OMNIPAQUE IOHEXOL 300 MG/ML SOLN, 367mOMNIPAQUE IOHEXOL 300 MG/ML SOLN COMPARISON:  Radiograph 01/31/2019, CT 09/13/2017 FINDINGS: Lower chest: Lung bases demonstrate moderate pleural effusions and bilateral lower lobe atelectasis or pneumonia. Mild cardiomegaly with right greater than left atrial enlargement. Hepatobiliary: Perfusion abnormality within the left lobe of the liver as before. No calcified gallstone. No biliary dilatation. Pancreas: Unremarkable. No pancreatic ductal dilatation or surrounding inflammatory changes. Spleen: Normal in size without focal abnormality. Adrenals/Urinary Tract: Adrenal glands are within normal limits. No hydronephrosis. Multiple subcentimeter cortical hypodense renal lesions too small to further characterize. Slightly complicated cyst at the lower poles. The bladder is unremarkable Stomach/Bowel: Stomach is nonenlarged. Multiple dilated fluid-filled loops of small bowel within the lower abdomen and pelvis with dilated bowel measuring up to 4.7 cm. Transition point in the right pelvis, mid to distal small bowel, series 2, image number 55 with decompressed small bowel distal to this. No intramural air. No bowel wall thickening. Extensive sigmoid colon diverticular disease. The colon is collapsed. Vascular/Lymphatic: Moderate aortic atherosclerosis without  aneurysm. No significant adenopathy. Reproductive: Status post hysterectomy. No adnexal masses. Other: Negative for free air. Small amount of ascites in the right lower quadrant near the transition  point. Musculoskeletal: Scoliosis and degenerative changes of the spine. No acute osseous abnormality. IMPRESSION: 1. Findings consistent with small bowel obstruction with transition point visualized in the mid to distal ileum in the right hemipelvis. Findings probably due to adhesions. Small amount of ascites within the right lower mesentery near the transition point. Negative for intramural air or free air. 2. Moderate pleural effusions with basilar atelectasis or pneumonia. Cardiomegaly. 3. Multiple hypodense renal lesions, too small to further characterize, probable small cysts and complicated cysts. 4. Sigmoid colon diverticular disease without acute inflammatory change Electronically Signed   By: Donavan Foil M.D.   On: 03/08/2019 23:35   Dg Chest Port 1 View  Result Date: 03/21/2019 CLINICAL DATA:  Sob worsened this am, pt on 5L O2 EXAM: PORTABLE CHEST - 1 VIEW COMPARISON:  01/31/2019 FINDINGS: Low lung volumes with resultant crowding of bronchovascular structures. Moderate bilateral pleural effusions with worsening consolidation/atelectasis at the lung bases. Heart size difficult to assess due to adjacent opacities. Aortic Atherosclerosis (ICD10-170.0). No pneumothorax. DJD in bilateral shoulders. IMPRESSION: Worsening bilateral pleural effusions and bibasilar atelectasis/consolidation. Electronically Signed   By: Lucrezia Europe M.D.   On: 03/21/2019 12:46   Dg Abd Portable 1v-small Bowel Obstruction Protocol-initial, 8 Hr Delay  Result Date: 03/20/2019 CLINICAL DATA:  Small bowel obstruction, 8 hour delayed film. EXAM: PORTABLE ABDOMEN - 1 VIEW COMPARISON:  Radiograph earlier this day. FINDINGS: There is administered enteric contrast in the ascending, transverse, and rectosigmoid colon. Improved small bowel dilatation from prior exam. Enteric tube remains in place. Minimal residual contrast in the bladder from prior CT. IMPRESSION: Administered enteric contrast throughout the entire colon. Improved  small bowel dilatation from prior. Electronically Signed   By: Keith Rake M.D.   On: 03/20/2019 02:16     Antimicrobials:   Zosyn day 3    Subjective: Patient doing poorly, rapid response this morning with hypoxic respiratory failure ABG revealed hypercarbia with respiratory acidosis  Objective: Vitals:   03/22/19 1248 04/22/2019 0228 04-22-2019 0630 Apr 22, 2019 0638  BP: (!) 113/59 (!) 92/49 (!) 92/45   Pulse: 71 65 68   Resp: (!) _0 Temp: 97.9 F (36.6 C) 97.7 F (36.5 C) (!) 97.2 F (36.2 C)   TempSrc: Axillary Oral Tympanic   SpO2: (!) 82% 94%    Weight:    54.5 kg  Height:        Intake/Output Summary (Last 24 hours) at 04/22/2019 6389 Last data filed at 04-22-2019 0617 Gross per 24 hour  Intake 689.55 ml  Output 100 ml  Net 589.55 ml   Filed Weights   03/20/19 0500 03/21/19 0500 22-Apr-2019 0638  Weight: 46.2 kg 46.9 kg 54.5 kg    Examination:  General exam: Obtunded Respiratory system: rhonchi bilat Cardiovascular system: irreg irreg Gastrointestinal system: Abdomen is nondistended,Quiet bowel sounds heard. Central nervous system: obtunded Extremities: thin, wwp. Skin: No rashes, lesions or ulcers Psychiatry: obtunded, unable to assess.     Data Reviewed: I have personally reviewed following labs and imaging studies  CBC: Recent Labs  Lab 02/26/2019 1931 03/19/19 0354 03/20/19 0434 03/21/19 0324 03/22/19 0856  WBC 16.8* 15.6* 13.1* 14.8* 15.8*  NEUTROABS 14.9* 13.6* 11.6* 13.2* 14.2*  HGB 12.5 12.7 12.3 12.9 13.1  HCT 43.6 45.3 42.6 44.3 45.0  MCV 104.1* 106.1* 105.4* 105.7* 103.9*  PLT 184 189 187 198 696   Basic Metabolic Panel: Recent Labs  Lab 03/26/2019 1931 03/19/19 0354 03/20/19 0434 03/21/19 0324 03/22/19 0856  NA 140 140 146* 145 144  K 3.4* 3.9 3.5 3.6 4.2  CL 101 104 111 110 107  CO2 _0 GLUCOSE 134* 145* 187* 160* 133*  BUN 29* 27* 24* 31* 55*  CREATININE 0.98 0.97 0.89 1.16* 1.31*  CALCIUM 8.7* 8.3*  8.6* 9.0 9.0  MG  --  2.1  --   --   --    GFR: Estimated Creatinine Clearance: 24.6 mL/min (A) (by C-G formula based on SCr of 1.31 mg/dL (H)). Liver Function Tests: Recent Labs  Lab 03/03/2019 1931  AST 16  ALT 10  ALKPHOS 70  BILITOT 0.4  PROT 7.0  ALBUMIN 3.3*   Recent Labs  Lab 03/06/2019 1931  LIPASE 27   No results for input(s): AMMONIA in the last 168 hours. Coagulation Profile: No results for input(s): INR, PROTIME in the last 168 hours. Cardiac Enzymes: No results for input(s): CKTOTAL, CKMB, CKMBINDEX, TROPONINI in the last 168 hours. BNP (last 3 results) No results for input(s): PROBNP in the last 8760 hours. HbA1C: No results for input(s): HGBA1C in the last 72 hours. CBG: Recent Labs  Lab 03/20/19 0732 03/21/19 0731 03/22/19 0753 03/22/19 1936 2019-04-15 0746  GLUCAP 159* 131* 116* 144* 113*   Lipid Profile: No results for input(s): CHOL, HDL, LDLCALC, TRIG, CHOLHDL, LDLDIRECT in the last 72 hours. Thyroid Function Tests: No results for input(s): TSH, T4TOTAL, FREET4, T3FREE, THYROIDAB in the last 72 hours. Anemia Panel: No results for input(s): VITAMINB12, FOLATE, FERRITIN, TIBC, IRON, RETICCTPCT in the last 72 hours. Sepsis Labs: No results for input(s): PROCALCITON, LATICACIDVEN in the last 168 hours.  Recent Results (from the past 240 hour(s))  SARS Coronavirus 2 (CEPHEID - Performed in [place] hospital lab), Hosp Order     Status: None   Collection Time: 03/19/19 12:03 AM   Specimen: Nasopharyngeal Swab  Result Value Ref Range Status   SARS Coronavirus 2 NEGATIVE NEGATIVE Final    Comment: (NOTE) If result is NEGATIVE SARS-CoV-2 target nucleic acids are NOT DETECTED. The SARS-CoV-2 RNA is generally detectable in upper and lower  respiratory specimens during the acute phase of infection. The lowest  concentration of SARS-CoV-2 viral copies this assay can detect is 250  copies / mL. A negative result does not preclude SARS-CoV-2 infection    and should not be used as the sole basis for treatment or other  patient management decisions.  A negative result may occur with  improper specimen collection / handling, submission of specimen other  than nasopharyngeal swab, presence of viral mutation(s) within the  areas targeted by this assay, and inadequate number of viral copies  (<250 copies / mL). A negative result must be combined with clinical  observations, patient history, and epidemiological information. If result is POSITIVE SARS-CoV-2 target nucleic acids are DETECTED. The SARS-CoV-2 RNA is generally detectable in upper and lower  respiratory specimens dur ing the acute phase of infection.  Positive  results are indicative of active infection with SARS-CoV-2.  Clinical  correlation with patient history and other diagnostic information is  necessary to determine patient infection status.  Positive results do  not rule out bacterial infection or co-infection with other viruses. If result is PRESUMPTIVE POSTIVE SARS-CoV-2 nucleic acids MAY BE PRESENT.   A presumptive positive result was obtained on the submitted specimen  and confirmed on repeat testing.  While 2019 novel coronavirus  (SARS-CoV-2) nucleic acids may be present in the submitted sample  additional confirmatory testing may be necessary for epidemiological  and / or clinical management purposes  to differentiate between  SARS-CoV-2 and other Sarbecovirus currently known to infect humans.  If clinically indicated additional testing with an alternate test  methodology [phone]) is advised. The SARS-CoV-2 RNA is generally  detectable in upper and lower respiratory sp ecimens during the acute  phase of infection. The expected result is Negative. Fact Sheet for Patients:  StrictlyIdeas.no Fact Sheet for Healthcare Providers: BankingDealers.co.za This test is not yet approved or cleared by the [place] FDA  and has been authorized for detection and/or diagnosis of SARS-CoV-2 by FDA under an Emergency Use Authorization (EUA).  This EUA will remain in effect (meaning this test can be used) for the duration of the COVID-19 declaration under Section 564(b)(1) of the Act, 21 U.S.C. section 360bbb-3(b)(1), unless the authorization is terminated or revoked sooner. Performed at [facility], [place] [address] [place], [place] [address]   C difficile quick scan w PCR reflex     Status: None   Collection Time: 03/19/19  4:03 AM   Specimen: STOOL  Result Value Ref Range Status   C Diff antigen NEGATIVE NEGATIVE Final   C Diff toxin NEGATIVE NEGATIVE Final   C Diff interpretation No C. difficile detected.  Final    Comment: Performed at [facility], [place] [address], [place], [place] [address]         Radiology Studies: Dg Chest Port 1 View  Result Date: 03/21/2019 CLINICAL DATA:  Sob worsened this am, pt on 5L O2 EXAM: PORTABLE CHEST - 1 VIEW COMPARISON:  01/31/2019 FINDINGS: Low lung volumes with resultant crowding of bronchovascular structures. Moderate bilateral pleural effusions with worsening consolidation/atelectasis at the lung bases. Heart size difficult to assess due to adjacent opacities. Aortic Atherosclerosis (ICD10-170.0). No pneumothorax. DJD in bilateral shoulders. IMPRESSION: Worsening bilateral pleural effusions and bibasilar atelectasis/consolidation. Electronically Signed   By: Lucrezia Europe M.D.   On: 03/21/2019 12:46        Scheduled Meds:  apixaban  2.5 mg Oral BID   diltiazem  30 mg Oral Q8H   levothyroxine  25 mcg Intravenous Daily   metoprolol tartrate  25 mg Oral BID   Continuous Infusions:  piperacillin-tazobactam (ZOSYN)  IV 3.375 g (04-10-2019 0617)     LOS: 4 days    Time spent: 78 min    Nicolette Bang, MD [facility]  If 7PM-7AM, please contact night-coverage  04/10/2019, 8:32 AM

## 2019-03-28 NOTE — Death Summary Note (Signed)
Physician Discharge Summary  Andrea [name]:[id number] DOB: 12-29-1928 DOA: 02/28/2019  PCP: Unk Pinto, MD  Admit date: 03/11/2019 Discharge date: 04-15-2019  Admitted From: Inpatient Disposition: deceased   Brief/Interim Summary: 85 yo female who presented with nausea, vomiting and diarrhea.She does have the significant past medical history for paroxysmal atrial fibrillation, COPD with chronic hypoxic respiratory failure, hypertension and hypothyroidism. Reported about 24 hours of nausea, vomiting and nonbloody diarrhea, persistent, unable to tolerate any p.o. intake, no fevers, no chills. On her initial physical examination blood pressure 119/77, heart rate 114, respiratory rate 16, temperature 97.9, oxygen saturation 100%, her lungs are clear to auscultation bilaterally, heart S1-S2 present, irregularly irregular, abdomen was distended and tender, without rebound or guarding, no lower extremity edema. Sodium 140, potassium 3.4, chloride 101, bicarb 29, glucose 134, BUN 29, creatinine 1.98, lipase 27, AST 16, ALT 10, total bilirubin 0.4, white count 16.8, hemoglobin 12.5, hematocrit 43.6, platelets 184.SARS COVID-19 was negative.Urine analysis negative for infection.CT of the abdomen with small bowel obstruction with transition point visualized in the mid to distal ileum in the right hemipelvis.  Patient was admitted to the hospital for working diagnosis of small bowel obstruction.  06/23. Developed acute hypoxic respiratory failure due to volume overload, required diuresis.  Hospital course: Patient was admitted and treated for small bowel obstruction which resolved.  She was seen by general surgery consultation.  She was also noted to have acute dehydration with CKD.  She was hydrated.  Unfortunately patient was moderately over hydrated required mild diuresis which was done.  Patient was improving but developed new acute hypoxic respiratory failure not responsive to  diuresis.  Chest x-ray showed effusions possible infiltrate.  She was placed on Zosyn.  Patient continued to do poorly, discussed with the family plan of care.  They initially want to make the patient DNR which was done.  During rapid response of day death family had second thoughts about DNR status and for a brief period of time want to pursue full care.  Patient was transferred to the ICU and prior to intubation family decided to keep the patient DNR.  Patient passed peacefully test of again filled out.  Discharge Diagnoses:  Principal Problem:   SBO (small bowel obstruction) (HCC) Active Problems:   COPD with emphysema (HCC)   Hypothyroidism   CKD (chronic kidney disease) stage 3, GFR 30-59 ml/min (HCC)   Pulmonary hypertension due to COPD ([place])   Chronic respiratory failure with hypoxia (HCC)   AF (paroxysmal atrial fibrillation) ([place])   Watery diarrhea    Discharge Instructions  Patient deceased no discharge med rec.  Allergies  Allergen Reactions  . Vancomycin     Worsening kidney function  . Sulfa Antibiotics Palpitations    Unknown    Consultations:  gen surg,    Procedures/Studies: Dg Shoulder Right  Result Date: 02/26/2019 CLINICAL DATA:  Fall with shoulder pain EXAM: RIGHT SHOULDER - 2+ VIEW COMPARISON:  11/06/2014 FINDINGS: No acute displaced fracture or dislocation. Superior migration of the humeral head with efface subacromial space, consistent with rotator cuff disease. Advanced arthritis of the right shoulder. IMPRESSION: 1. No acute osseous abnormality 2. Advanced arthritis of right shoulder with high-riding humeral head/rotator cuff disease. Electronically Signed   By: Donavan Foil M.D.   On: 03/13/2019 22:49   Dg Abdomen 1 View  Result Date: 03/19/2019 CLINICAL DATA:  NG tube placement. EXAM: ABDOMEN - 1 VIEW COMPARISON:  CT yesterday. FINDINGS: Tip and side port of the enteric tube  below the diaphragm in the stomach. Dilated small bowel again seen.  Excreted IV contrast in both renal collecting systems and urinary bladder from prior CT. IMPRESSION: Tip and side port of the enteric tube below the diaphragm in the stomach. Electronically Signed   By: Keith Rake M.D.   On: 03/19/2019 02:35   Ct Abdomen Pelvis W Contrast  Result Date: 03/24/2019 CLINICAL DATA:  Abdominal pain nausea vomiting EXAM: CT ABDOMEN AND PELVIS WITH CONTRAST TECHNIQUE: Multidetector CT imaging of the abdomen and pelvis was performed using the standard protocol following bolus administration of intravenous contrast. CONTRAST:  168m OMNIPAQUE IOHEXOL 300 MG/ML SOLN, 351mOMNIPAQUE IOHEXOL 300 MG/ML SOLN COMPARISON:  Radiograph 01/31/2019, CT 09/13/2017 FINDINGS: Lower chest: Lung bases demonstrate moderate pleural effusions and bilateral lower lobe atelectasis or pneumonia. Mild cardiomegaly with right greater than left atrial enlargement. Hepatobiliary: Perfusion abnormality within the left lobe of the liver as before. No calcified gallstone. No biliary dilatation. Pancreas: Unremarkable. No pancreatic ductal dilatation or surrounding inflammatory changes. Spleen: Normal in size without focal abnormality. Adrenals/Urinary Tract: Adrenal glands are within normal limits. No hydronephrosis. Multiple subcentimeter cortical hypodense renal lesions too small to further characterize. Slightly complicated cyst at the lower poles. The bladder is unremarkable Stomach/Bowel: Stomach is nonenlarged. Multiple dilated fluid-filled loops of small bowel within the lower abdomen and pelvis with dilated bowel measuring up to 4.7 cm. Transition point in the right pelvis, mid to distal small bowel, series 2, image number 55 with decompressed small bowel distal to this. No intramural air. No bowel wall thickening. Extensive sigmoid colon diverticular disease. The colon is collapsed. Vascular/Lymphatic: Moderate aortic atherosclerosis without aneurysm. No significant adenopathy. Reproductive: Status  post hysterectomy. No adnexal masses. Other: Negative for free air. Small amount of ascites in the right lower quadrant near the transition point. Musculoskeletal: Scoliosis and degenerative changes of the spine. No acute osseous abnormality. IMPRESSION: 1. Findings consistent with small bowel obstruction with transition point visualized in the mid to distal ileum in the right hemipelvis. Findings probably due to adhesions. Small amount of ascites within the right lower mesentery near the transition point. Negative for intramural air or free air. 2. Moderate pleural effusions with basilar atelectasis or pneumonia. Cardiomegaly. 3. Multiple hypodense renal lesions, too small to further characterize, probable small cysts and complicated cysts. 4. Sigmoid colon diverticular disease without acute inflammatory change Electronically Signed   By: KiDonavan Foil.D.   On: 03/26/2019 23:35   Dg Chest Port 1 View  Result Date: 03/21/2019 CLINICAL DATA:  Sob worsened this am, pt on 5L O2 EXAM: PORTABLE CHEST - 1 VIEW COMPARISON:  01/31/2019 FINDINGS: Low lung volumes with resultant crowding of bronchovascular structures. Moderate bilateral pleural effusions with worsening consolidation/atelectasis at the lung bases. Heart size difficult to assess due to adjacent opacities. Aortic Atherosclerosis (ICD10-170.0). No pneumothorax. DJD in bilateral shoulders. IMPRESSION: Worsening bilateral pleural effusions and bibasilar atelectasis/consolidation. Electronically Signed   By: D Lucrezia Europe.D.   On: 03/21/2019 12:46   Dg Abd Portable 1v-small Bowel Obstruction Protocol-initial, 8 Hr Delay  Result Date: 03/20/2019 CLINICAL DATA:  Small bowel obstruction, 8 hour delayed film. EXAM: PORTABLE ABDOMEN - 1 VIEW COMPARISON:  Radiograph earlier this day. FINDINGS: There is administered enteric contrast in the ascending, transverse, and rectosigmoid colon. Improved small bowel dilatation from prior exam. Enteric tube remains in place.  Minimal residual contrast in the bladder from prior CT. IMPRESSION: Administered enteric contrast throughout the entire colon. Improved small bowel dilatation from prior. Electronically  Signed   By: Keith Rake M.D.   On: 03/20/2019 02:16       Subjective: Deceased  Discharge Exam: Vitals:   31-Mar-2019 0900 2019-03-31 0930  BP: (!) 72/42   Pulse:    Resp: (!) 22 (!) 0  Temp:    SpO2:     Vitals:   31-Mar-2019 0630 2019-03-31 0638 31-Mar-2019 0900 2019-03-31 0930  BP: (!) 92/45  (!) 72/42   Pulse: 68     Resp: 16  (!) 22 (!) 0  Temp: (!) 97.2 F (36.2 C)     TempSrc: Tympanic     SpO2:      Weight:  54.5 kg    Height:            The results of significant diagnostics from this hospitalization (including imaging, microbiology, ancillary and laboratory) are listed below for reference.     Microbiology: Recent Results (from the past 240 hour(s))  SARS Coronavirus 2 (CEPHEID - Performed in [place] hospital lab), Hosp Order     Status: None   Collection Time: 03/19/19 12:03 AM   Specimen: Nasopharyngeal Swab  Result Value Ref Range Status   SARS Coronavirus 2 NEGATIVE NEGATIVE Final    Comment: (NOTE) If result is NEGATIVE SARS-CoV-2 target nucleic acids are NOT DETECTED. The SARS-CoV-2 RNA is generally detectable in upper and lower  respiratory specimens during the acute phase of infection. The lowest  concentration of SARS-CoV-2 viral copies this assay can detect is 250  copies / mL. A negative result does not preclude SARS-CoV-2 infection  and should not be used as the sole basis for treatment or other  patient management decisions.  A negative result may occur with  improper specimen collection / handling, submission of specimen other  than nasopharyngeal swab, presence of viral mutation(s) within the  areas targeted by this assay, and inadequate number of viral copies  (<250 copies / mL). A negative result must be combined with clinical  observations, patient  history, and epidemiological information. If result is POSITIVE SARS-CoV-2 target nucleic acids are DETECTED. The SARS-CoV-2 RNA is generally detectable in upper and lower  respiratory specimens dur ing the acute phase of infection.  Positive  results are indicative of active infection with SARS-CoV-2.  Clinical  correlation with patient history and other diagnostic information is  necessary to determine patient infection status.  Positive results do  not rule out bacterial infection or co-infection with other viruses. If result is PRESUMPTIVE POSTIVE SARS-CoV-2 nucleic acids MAY BE PRESENT.   A presumptive positive result was obtained on the submitted specimen  and confirmed on repeat testing.  While 2019 novel coronavirus  (SARS-CoV-2) nucleic acids may be present in the submitted sample  additional confirmatory testing may be necessary for epidemiological  and / or clinical management purposes  to differentiate between  SARS-CoV-2 and other Sarbecovirus currently known to infect humans.  If clinically indicated additional testing with an alternate test  methodology [phone]) is advised. The SARS-CoV-2 RNA is generally  detectable in upper and lower respiratory sp ecimens during the acute  phase of infection. The expected result is Negative. Fact Sheet for Patients:  StrictlyIdeas.no Fact Sheet for Healthcare Providers: BankingDealers.co.za This test is not yet approved or cleared by the [place] FDA and has been authorized for detection and/or diagnosis of SARS-CoV-2 by FDA under an Emergency Use Authorization (EUA).  This EUA will remain in effect (meaning this test can be used) for the duration of the COVID-19  declaration under Section 564(b)(1) of the Act, 21 U.S.C. section 360bbb-3(b)(1), unless the authorization is terminated or revoked sooner. Performed at [facility], [place] [address], [place],  [place] [address]   C difficile quick scan w PCR reflex     Status: None   Collection Time: 03/19/19  4:03 AM   Specimen: STOOL  Result Value Ref Range Status   C Diff antigen NEGATIVE NEGATIVE Final   C Diff toxin NEGATIVE NEGATIVE Final   C Diff interpretation No C. difficile detected.  Final    Comment: Performed at [facility], [place] [address], [place], [place] [address]     Labs: BNP (last 3 results) No results for input(s): BNP in the last 8760 hours. Basic Metabolic Panel: Recent Labs  Lab 03/22/2019 1931 03/19/19 0354 03/20/19 0434 03/21/19 0324 03/22/19 0856  NA 140 140 146* 145 144  K 3.4* 3.9 3.5 3.6 4.2  CL 101 104 111 110 107  CO2 _0 GLUCOSE 134* 145* 187* 160* 133*  BUN 29* 27* 24* 31* 55*  CREATININE 0.98 0.97 0.89 1.16* 1.31*  CALCIUM 8.7* 8.3* 8.6* 9.0 9.0  MG  --  2.1  --   --   --    Liver Function Tests: Recent Labs  Lab 02/28/2019 1931  AST 16  ALT 10  ALKPHOS 70  BILITOT 0.4  PROT 7.0  ALBUMIN 3.3*   Recent Labs  Lab 03/14/2019 1931  LIPASE 27   No results for input(s): AMMONIA in the last 168 hours. CBC: Recent Labs  Lab 03/13/2019 1931 03/19/19 0354 03/20/19 0434 03/21/19 0324 03/22/19 0856  WBC 16.8* 15.6* 13.1* 14.8* 15.8*  NEUTROABS 14.9* 13.6* 11.6* 13.2* 14.2*  HGB 12.5 12.7 12.3 12.9 13.1  HCT 43.6 45.3 42.6 44.3 45.0  MCV 104.1* 106.1* 105.4* 105.7* 103.9*  PLT 184 189 187 198 236   Cardiac Enzymes: No results for input(s): CKTOTAL, CKMB, CKMBINDEX, TROPONINI in the last 168 hours. BNP: Invalid input(s): POCBNP CBG: Recent Labs  Lab 03/20/19 0732 03/21/19 0731 03/22/19 0753 03/22/19 1936 04/02/2019 0746  GLUCAP 159* 131* 116* 144* 113*   D-Dimer No results for input(s): DDIMER in the last 72 hours. Hgb A1c No results for input(s): HGBA1C in the last 72 hours. Lipid Profile No results for input(s): CHOL, HDL, LDLCALC, TRIG, CHOLHDL, LDLDIRECT in the last 72 hours. Thyroid function  studies No results for input(s): TSH, T4TOTAL, T3FREE, THYROIDAB in the last 72 hours.  Invalid input(s): FREET3 Anemia work up No results for input(s): VITAMINB12, FOLATE, FERRITIN, TIBC, IRON, RETICCTPCT in the last 72 hours. Urinalysis    Component Value Date/Time   COLORURINE YELLOW 03/12/2019 1908   APPEARANCEUR CLEAR 03/17/2019 1908   LABSPEC 1.030 02/27/2019 1908   PHURINE 6.0 03/13/2019 1908   GLUCOSEU NEGATIVE 03/08/2019 1908   HGBUR NEGATIVE 03/02/2019 [place] NEGATIVE 03/21/2019 1908   KETONESUR NEGATIVE 03/19/2019 1908   PROTEINUR NEGATIVE 03/21/2019 1908   UROBILINOGEN 0.2 08/20/2014 1613   NITRITE NEGATIVE 03/10/2019 1908   LEUKOCYTESUR NEGATIVE 03/13/2019 1908   Sepsis Labs Invalid input(s): PROCALCITONIN,  WBC,  LACTICIDVEN Microbiology Recent Results (from the past 240 hour(s))  SARS Coronavirus 2 (CEPHEID - Performed in [place] hospital lab), Hosp Order     Status: None   Collection Time: 03/19/19 12:03 AM   Specimen: Nasopharyngeal Swab  Result Value Ref Range Status   SARS Coronavirus 2 NEGATIVE NEGATIVE Final    Comment: (NOTE) If  result is NEGATIVE SARS-CoV-2 target nucleic acids are NOT DETECTED. The SARS-CoV-2 RNA is generally detectable in upper and lower  respiratory specimens during the acute phase of infection. The lowest  concentration of SARS-CoV-2 viral copies this assay can detect is 250  copies / mL. A negative result does not preclude SARS-CoV-2 infection  and should not be used as the sole basis for treatment or other  patient management decisions.  A negative result may occur with  improper specimen collection / handling, submission of specimen other  than nasopharyngeal swab, presence of viral mutation(s) within the  areas targeted by this assay, and inadequate number of viral copies  (<250 copies / mL). A negative result must be combined with clinical  observations, patient history, and epidemiological information. If  result is POSITIVE SARS-CoV-2 target nucleic acids are DETECTED. The SARS-CoV-2 RNA is generally detectable in upper and lower  respiratory specimens dur ing the acute phase of infection.  Positive  results are indicative of active infection with SARS-CoV-2.  Clinical  correlation with patient history and other diagnostic information is  necessary to determine patient infection status.  Positive results do  not rule out bacterial infection or co-infection with other viruses. If result is PRESUMPTIVE POSTIVE SARS-CoV-2 nucleic acids MAY BE PRESENT.   A presumptive positive result was obtained on the submitted specimen  and confirmed on repeat testing.  While 2019 novel coronavirus  (SARS-CoV-2) nucleic acids may be present in the submitted sample  additional confirmatory testing may be necessary for epidemiological  and / or clinical management purposes  to differentiate between  SARS-CoV-2 and other Sarbecovirus currently known to infect humans.  If clinically indicated additional testing with an alternate test  methodology [phone]) is advised. The SARS-CoV-2 RNA is generally  detectable in upper and lower respiratory sp ecimens during the acute  phase of infection. The expected result is Negative. Fact Sheet for Patients:  StrictlyIdeas.no Fact Sheet for Healthcare Providers: BankingDealers.co.za This test is not yet approved or cleared by the [place] FDA and has been authorized for detection and/or diagnosis of SARS-CoV-2 by FDA under an Emergency Use Authorization (EUA).  This EUA will remain in effect (meaning this test can be used) for the duration of the COVID-19 declaration under Section 564(b)(1) of the Act, 21 U.S.C. section 360bbb-3(b)(1), unless the authorization is terminated or revoked sooner. Performed at [facility], [place] [address], [place], [place] [address]   C difficile quick scan w PCR  reflex     Status: None   Collection Time: 03/19/19  4:03 AM   Specimen: STOOL  Result Value Ref Range Status   C Diff antigen NEGATIVE NEGATIVE Final   C Diff toxin NEGATIVE NEGATIVE Final   C Diff interpretation No C. difficile detected.  Final    Comment: Performed at [facility], [place] [address], [place],  [address]     Time coordinating discharge: Over 30 minutes  SIGNED:   Nicolette Bang, MD  [facility] April 17, 2019, 11:39 AM Pager   If 7PM-7AM, please contact night-coverage [email or web address] Password [email or web address]

## 2019-03-28 NOTE — Progress Notes (Signed)
Patient brought down to ICU from medical floor with hypercarbic respiratory failure.  ABG 7.063 / 108 / 112 / 29.5.  Attempted brief BiPAP but patients sats dropped to 30's.  BVM utilized with improvement in saturations.  Patient with minimal response to physical stimuli.  Family discussion with husband & daughters. Reviewed patients overall medical state and acute change prompting ICU transfer.  Family had previously reversed intubation status.  Pilar Plate discussion regarding the benefits of mechanical ventilation and that it likely would not add to her improvement but only her suffering.  Family states that the do not want her to suffer.  We reviewed plan to attempt non-invasive measures for support.  If she declines despite this, we will transition to full comfort measures.  DNR in place.     Noe Gens, NP-C [facility] Pgr: [phone] or if no answer [phone] 04/17/2019, 9:20 AM

## 2019-03-28 NOTE — Significant Event (Addendum)
Is an 85 year old white female presented initially with nausea vomiting diarrhea with a past med history of A. fib and COPD with chronic respiratory failure who was initially admitted/treated for small bowel obstruction with surgery following.  Bowel function did return and patient's small bowel obstruction resolved.  During the course of illness patient's was volume overloaded 2/2 treatment for dehydration in the setting of CKD stage III.  She was diuresed but had persistent hypoxic respiratory failure. Multiple discussions with family members regarding CODE STATUS took place with daughter [name] indicating patient was remain DNR.  June 26 patient was having rapid response with hypotension and hypoxia.  Stat blood gases obtained, the results which are currently pending. Pt is obtunded and not protecting airway.  Family elected to reverse DNR and asked for full care.  I reviewed with [name] over the phone as wella s [name] who was bedside, who indicated they want to pursue ventilator pressors and CPR if needed.  They are aware of the complications up to including breaking ribs poor outcome and inability to wean from ventilator. Pt was transportated to critical care/ICU after speaking with APP as well as critical care MD.  Patient transported to room 1238 for critical care

## 2019-03-28 NOTE — Progress Notes (Signed)
Referred by nursing.  Pt comfort care, progressing to end of life, family present.   Chaplain providing support with family at bedside.  Spouse, three daughters present.  Present with family at pt death.  Provided grief support including space for processing emotions, normalization, eulogizing / narrative, pastoral presence.  Present on unit for continued support.

## 2019-03-28 DEATH — deceased

## 2019-06-22 ENCOUNTER — Ambulatory Visit: Payer: Medicare Other | Admitting: Cardiology

## 2019-09-18 ENCOUNTER — Ambulatory Visit: Payer: Self-pay | Admitting: Adult Health

## 2019-12-01 IMAGING — US US THORACENTESIS ASP PLEURAL SPACE W/IMG GUIDE
1 series · 6 of 6 positions shown · non-contrast
Comparison: none

INDICATION: Pleural effusions resistant to diuresis.

[Series 1: us thoracentesis asp pleural space w/img guide · 0.35mm/px · 6 of 6 slices shown]
[im 1/6]
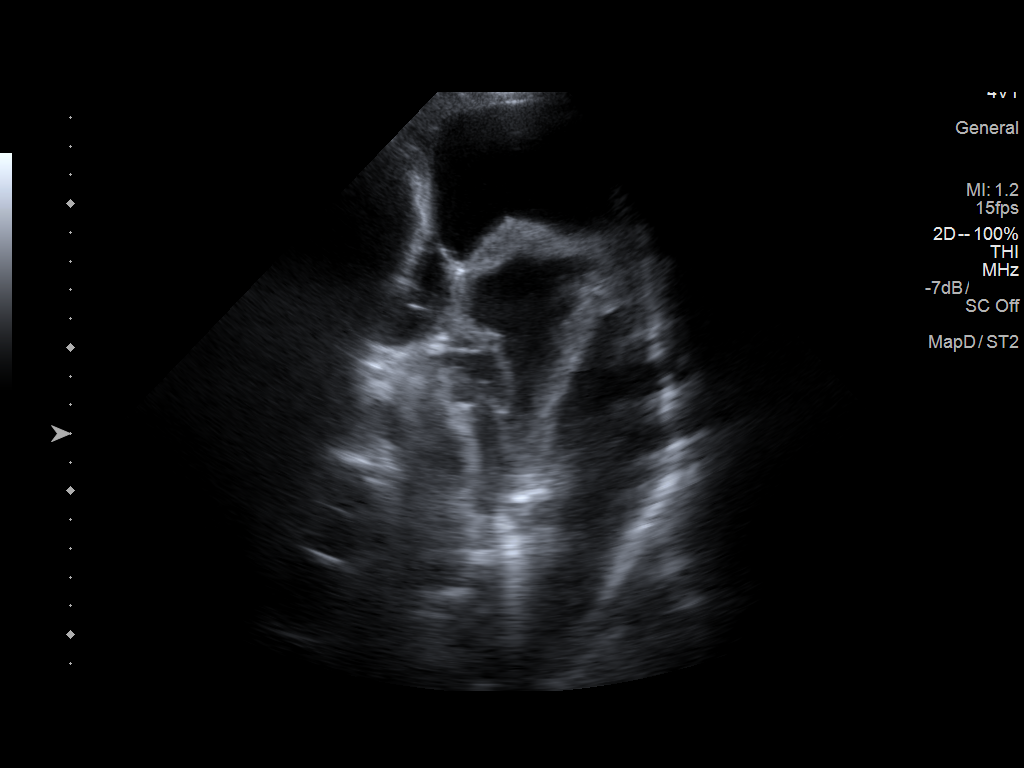
[im 2/6]
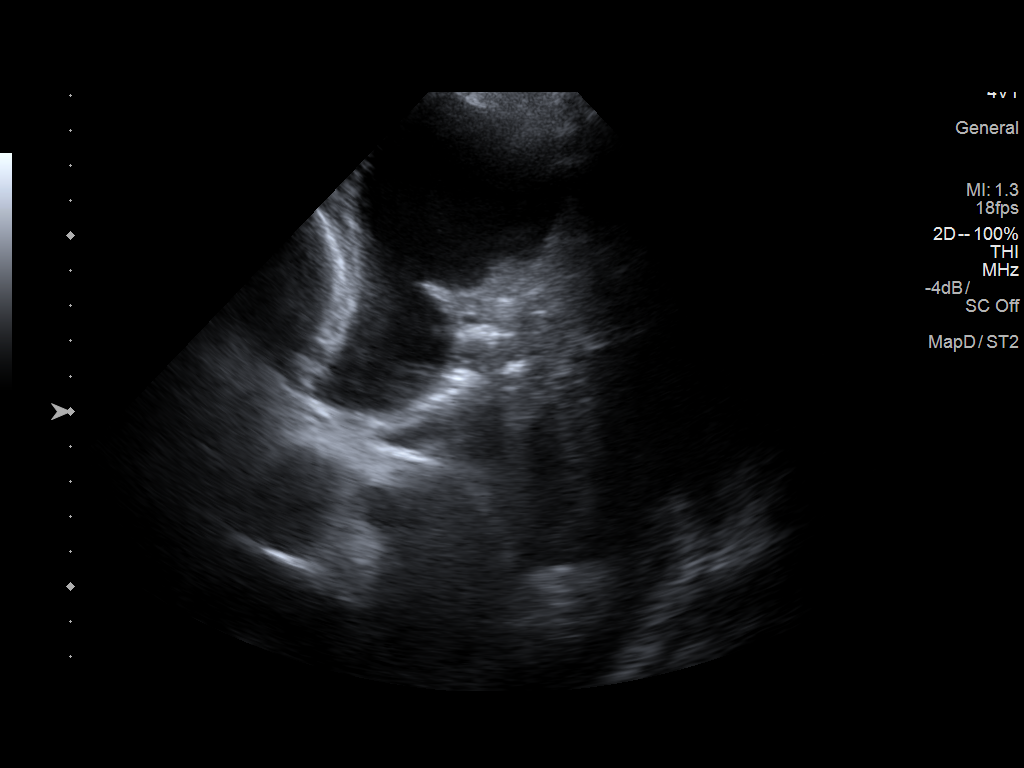
[im 3/6]
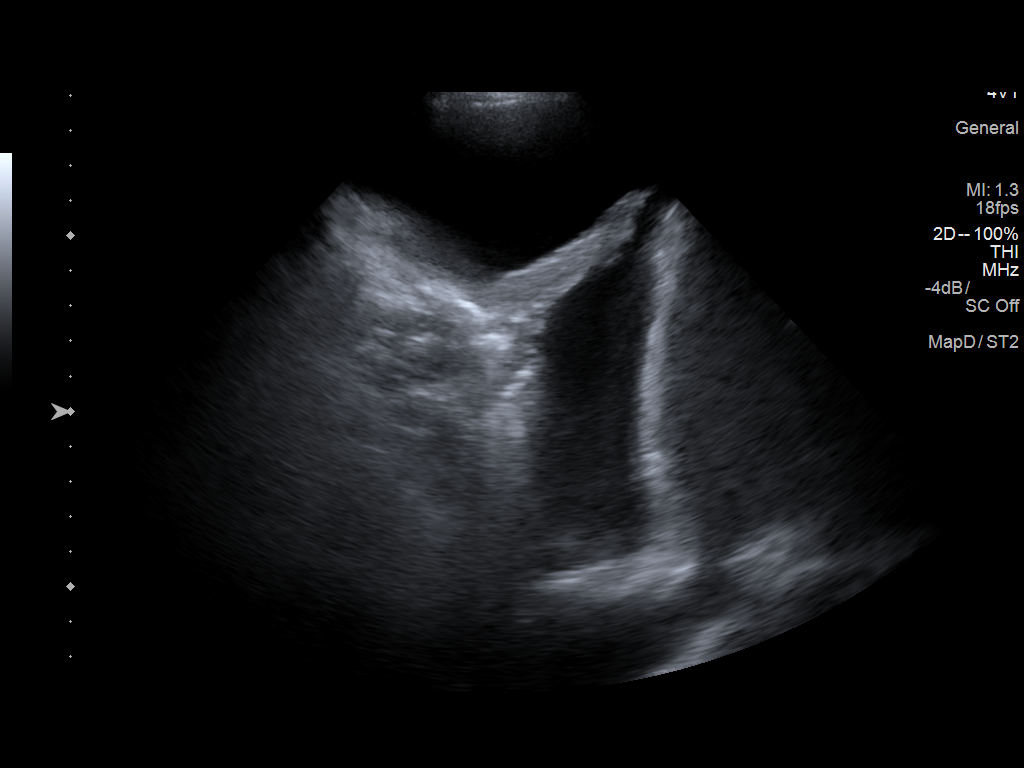
[im 4/6]
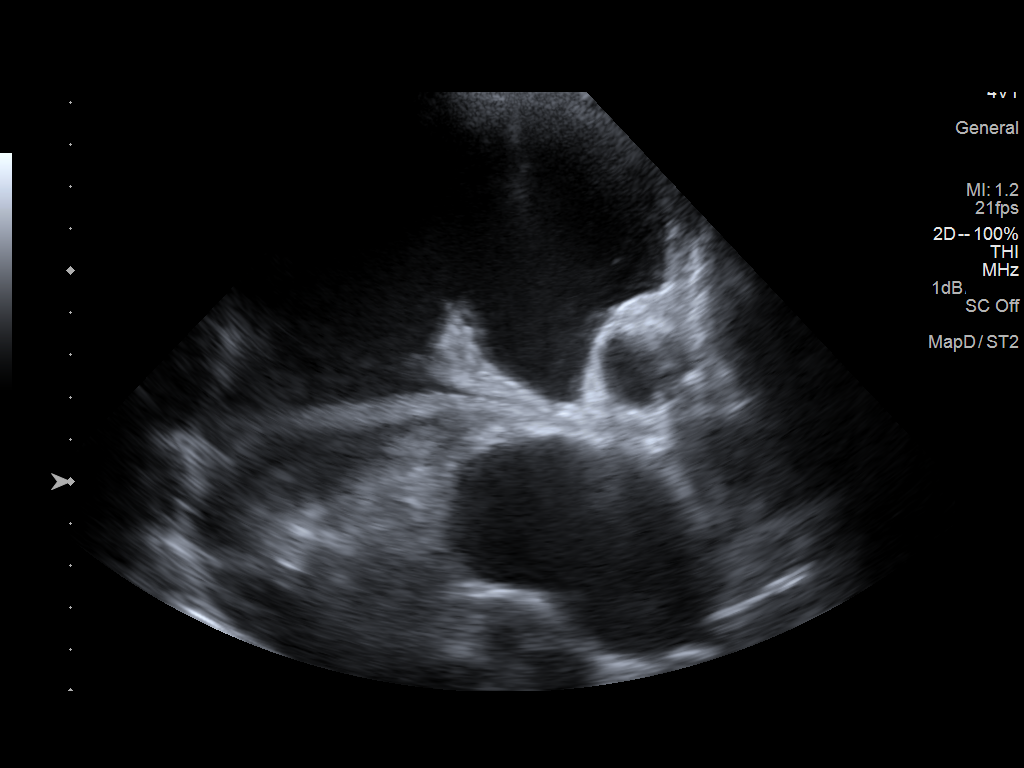
[im 5/6]
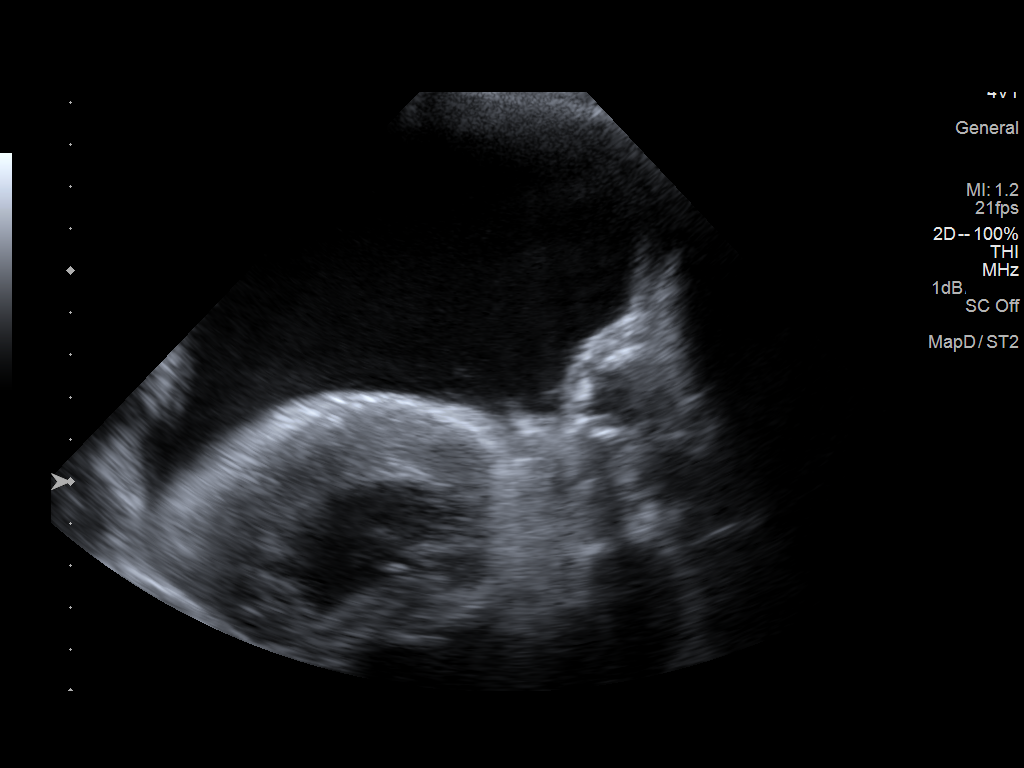
[im 6/6]
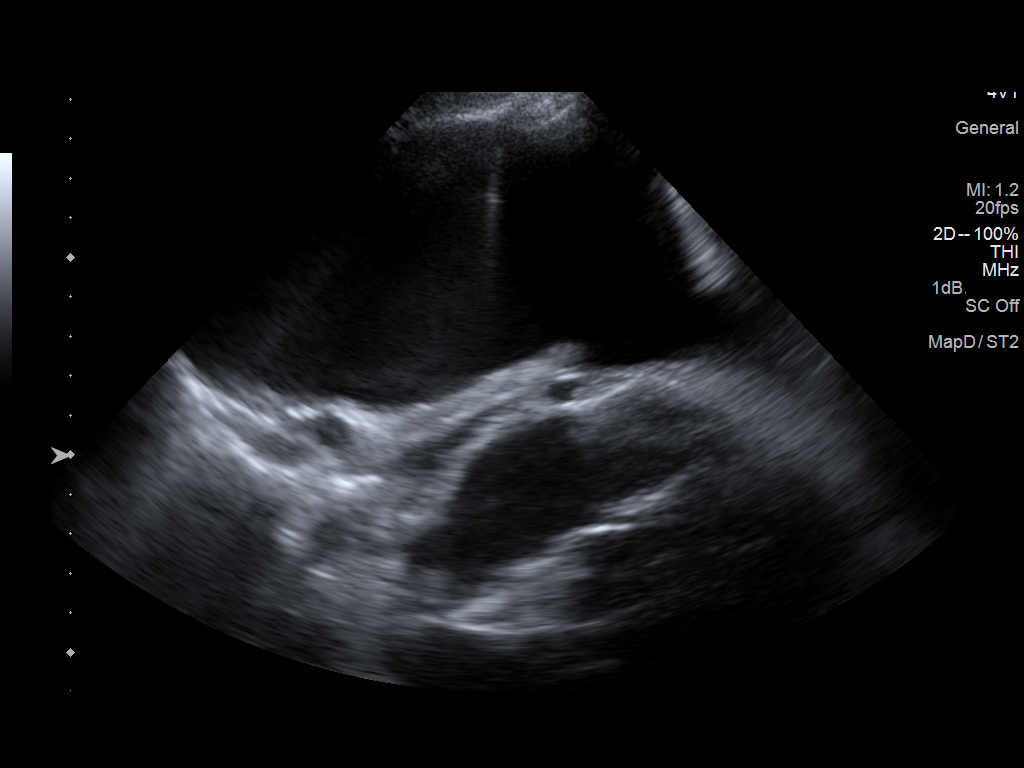

[6 of 6 positions shown; findings below may reference images not displayed]

EXAM:
ULTRASOUND GUIDED LEFT THORACENTESIS

MEDICATIONS:
None.

COMPLICATIONS:
None immediate.

PROCEDURE:
An ultrasound guided thoracentesis was thoroughly discussed with the
patient and questions answered. The benefits, risks, alternatives
and complications were also discussed. The patient is at increased
bleeding risk due to Eliquis use and we had a lengthy discussion
about this; the patient's husband was also called on the telephone.
The patient understands and wishes to proceed with the procedure.
Written consent was obtained.

Ultrasound was performed to localize and mark an adequate pocket of
fluid in the left chest. The area was then prepped and draped in the
normal sterile fashion. 1% Lidocaine was used for local anesthesia.
Under ultrasound guidance a 19 gauge, 7-cm, Yueh catheter was
introduced. Thoracentesis was performed until the available fluid
was drained. I scanned the left chest and there was no evidence of
bleeding. The catheter was removed and a dressing applied.
FINDINGS: A total of approximately 1 L of amber fluid was removed. Samples
were sent to the laboratory as requested by the clinical team.

A chest x-ray was obtained and shows significant decrease in left
pleural effusion, with small volume fluid remaining. No visible
pneumothorax. There is a moderate right pleural effusion.
Cardiomegaly and vascular congestion.
IMPRESSION: 1. Successful ultrasound guided left thoracentesis yielding 1 L of
pleural fluid.
2. Minimal fluid remaining on the left. Right pleural effusion is
moderate.

## 2019-12-25 ENCOUNTER — Encounter: Payer: Medicare Other | Admitting: Physician Assistant

## 2020-03-13 ENCOUNTER — Ambulatory Visit: Payer: Medicare Other | Admitting: Physician Assistant
